# Patient Record
Sex: Female | Born: 1935
Health system: Southern US, Community
[De-identification: ages and names within clinical notes are randomized; demographics above are authoritative.]

## PROBLEM LIST (undated history)

## (undated) DIAGNOSIS — G4733 Obstructive sleep apnea (adult) (pediatric): Secondary | ICD-10-CM

## (undated) DIAGNOSIS — Z8719 Personal history of other diseases of the digestive system: Secondary | ICD-10-CM

## (undated) DIAGNOSIS — K219 Gastro-esophageal reflux disease without esophagitis: Secondary | ICD-10-CM

## (undated) DIAGNOSIS — Z9889 Other specified postprocedural states: Secondary | ICD-10-CM

## (undated) DIAGNOSIS — J869 Pyothorax without fistula: Secondary | ICD-10-CM

## (undated) DIAGNOSIS — I442 Atrioventricular block, complete: Secondary | ICD-10-CM

## (undated) DIAGNOSIS — D649 Anemia, unspecified: Secondary | ICD-10-CM

## (undated) DIAGNOSIS — I4510 Unspecified right bundle-branch block: Secondary | ICD-10-CM

## (undated) DIAGNOSIS — K220 Achalasia of cardia: Secondary | ICD-10-CM

## (undated) DIAGNOSIS — M1990 Unspecified osteoarthritis, unspecified site: Secondary | ICD-10-CM

## (undated) DIAGNOSIS — E042 Nontoxic multinodular goiter: Secondary | ICD-10-CM

## (undated) DIAGNOSIS — I10 Essential (primary) hypertension: Secondary | ICD-10-CM

## (undated) DIAGNOSIS — K4090 Unilateral inguinal hernia, without obstruction or gangrene, not specified as recurrent: Secondary | ICD-10-CM

## (undated) DIAGNOSIS — IMO0002 Reserved for concepts with insufficient information to code with codable children: Secondary | ICD-10-CM

## (undated) DIAGNOSIS — N3642 Intrinsic sphincter deficiency (ISD): Secondary | ICD-10-CM

## (undated) DIAGNOSIS — Z8709 Personal history of other diseases of the respiratory system: Secondary | ICD-10-CM

## (undated) DIAGNOSIS — M353 Polymyalgia rheumatica: Secondary | ICD-10-CM

## (undated) DIAGNOSIS — Z9989 Dependence on other enabling machines and devices: Secondary | ICD-10-CM

## (undated) DIAGNOSIS — N3946 Mixed incontinence: Secondary | ICD-10-CM

## (undated) HISTORY — PX: VIDEO ASSISTED THORACOSCOPY (VATS)/EMPYEMA: SHX6172

## (undated) HISTORY — DX: Polymyalgia rheumatica: M35.3

## (undated) HISTORY — PX: TONSILLECTOMY: SUR1361

## (undated) HISTORY — DX: Unspecified osteoarthritis, unspecified site: M19.90

## (undated) HISTORY — DX: Essential (primary) hypertension: I10

## (undated) HISTORY — DX: Pyothorax without fistula: J86.9

## (undated) HISTORY — DX: Nontoxic multinodular goiter: E04.2

## (undated) HISTORY — PX: CARDIAC PACEMAKER PLACEMENT: SHX583

## (undated) HISTORY — PX: OTHER SURGICAL HISTORY: SHX169

## (undated) HISTORY — DX: Gastro-esophageal reflux disease without esophagitis: K21.9

## (undated) HISTORY — PX: DILATION AND CURETTAGE OF UTERUS: SHX78

---
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
## 1999-07-13 HISTORY — PX: TOTAL HIP ARTHROPLASTY: SHX124

## 2003-07-13 HISTORY — PX: CATARACT EXTRACTION W/ INTRAOCULAR LENS  IMPLANT, BILATERAL: SHX1307

## 2007-12-13 HISTORY — PX: PACEMAKER GENERATOR CHANGE: SHX5998

## 2011-04-01 DIAGNOSIS — M75100 Unspecified rotator cuff tear or rupture of unspecified shoulder, not specified as traumatic: Secondary | ICD-10-CM | POA: Insufficient documentation

## 2011-04-01 DIAGNOSIS — M7550 Bursitis of unspecified shoulder: Secondary | ICD-10-CM | POA: Insufficient documentation

## 2011-05-10 DIAGNOSIS — S63509A Unspecified sprain of unspecified wrist, initial encounter: Secondary | ICD-10-CM | POA: Insufficient documentation

## 2011-07-13 HISTORY — PX: TOE SURGERY: SHX1073

## 2011-07-14 DIAGNOSIS — Z950 Presence of cardiac pacemaker: Secondary | ICD-10-CM | POA: Diagnosis not present

## 2011-07-30 DIAGNOSIS — M353 Polymyalgia rheumatica: Secondary | ICD-10-CM | POA: Diagnosis not present

## 2011-10-20 DIAGNOSIS — D2370 Other benign neoplasm of skin of unspecified lower limb, including hip: Secondary | ICD-10-CM | POA: Diagnosis not present

## 2011-10-20 DIAGNOSIS — M2040 Other hammer toe(s) (acquired), unspecified foot: Secondary | ICD-10-CM | POA: Diagnosis not present

## 2011-10-20 DIAGNOSIS — L600 Ingrowing nail: Secondary | ICD-10-CM | POA: Diagnosis not present

## 2011-10-20 DIAGNOSIS — Z950 Presence of cardiac pacemaker: Secondary | ICD-10-CM | POA: Diagnosis not present

## 2011-10-26 DIAGNOSIS — L820 Inflamed seborrheic keratosis: Secondary | ICD-10-CM | POA: Diagnosis not present

## 2011-10-26 DIAGNOSIS — L219 Seborrheic dermatitis, unspecified: Secondary | ICD-10-CM | POA: Diagnosis not present

## 2011-10-28 DIAGNOSIS — M353 Polymyalgia rheumatica: Secondary | ICD-10-CM | POA: Diagnosis not present

## 2011-10-28 DIAGNOSIS — Z79899 Other long term (current) drug therapy: Secondary | ICD-10-CM | POA: Diagnosis not present

## 2012-01-18 DIAGNOSIS — I4729 Other ventricular tachycardia: Secondary | ICD-10-CM | POA: Insufficient documentation

## 2012-01-18 DIAGNOSIS — I472 Ventricular tachycardia: Secondary | ICD-10-CM | POA: Insufficient documentation

## 2012-01-18 DIAGNOSIS — Z950 Presence of cardiac pacemaker: Secondary | ICD-10-CM | POA: Insufficient documentation

## 2012-01-26 DIAGNOSIS — Z45018 Encounter for adjustment and management of other part of cardiac pacemaker: Secondary | ICD-10-CM | POA: Diagnosis not present

## 2012-01-27 DIAGNOSIS — M899 Disorder of bone, unspecified: Secondary | ICD-10-CM | POA: Diagnosis not present

## 2012-01-27 DIAGNOSIS — K219 Gastro-esophageal reflux disease without esophagitis: Secondary | ICD-10-CM | POA: Diagnosis not present

## 2012-01-27 DIAGNOSIS — IMO0002 Reserved for concepts with insufficient information to code with codable children: Secondary | ICD-10-CM | POA: Diagnosis not present

## 2012-01-27 DIAGNOSIS — M353 Polymyalgia rheumatica: Secondary | ICD-10-CM | POA: Diagnosis not present

## 2012-01-27 DIAGNOSIS — Z7952 Long term (current) use of systemic steroids: Secondary | ICD-10-CM | POA: Insufficient documentation

## 2012-02-02 DIAGNOSIS — Z23 Encounter for immunization: Secondary | ICD-10-CM | POA: Diagnosis not present

## 2012-02-02 DIAGNOSIS — Z029 Encounter for administrative examinations, unspecified: Secondary | ICD-10-CM | POA: Diagnosis not present

## 2012-02-04 DIAGNOSIS — Z111 Encounter for screening for respiratory tuberculosis: Secondary | ICD-10-CM | POA: Diagnosis not present

## 2012-03-28 DIAGNOSIS — K224 Dyskinesia of esophagus: Secondary | ICD-10-CM | POA: Diagnosis not present

## 2012-04-17 DIAGNOSIS — L988 Other specified disorders of the skin and subcutaneous tissue: Secondary | ICD-10-CM | POA: Diagnosis not present

## 2012-04-17 DIAGNOSIS — M2040 Other hammer toe(s) (acquired), unspecified foot: Secondary | ICD-10-CM | POA: Insufficient documentation

## 2012-04-17 DIAGNOSIS — M79609 Pain in unspecified limb: Secondary | ICD-10-CM | POA: Diagnosis not present

## 2012-04-17 DIAGNOSIS — L989 Disorder of the skin and subcutaneous tissue, unspecified: Secondary | ICD-10-CM | POA: Insufficient documentation

## 2012-04-17 DIAGNOSIS — M79676 Pain in unspecified toe(s): Secondary | ICD-10-CM | POA: Insufficient documentation

## 2012-04-17 DIAGNOSIS — Z8679 Personal history of other diseases of the circulatory system: Secondary | ICD-10-CM | POA: Diagnosis not present

## 2012-04-21 DIAGNOSIS — M2040 Other hammer toe(s) (acquired), unspecified foot: Secondary | ICD-10-CM | POA: Diagnosis not present

## 2012-04-27 DIAGNOSIS — M353 Polymyalgia rheumatica: Secondary | ICD-10-CM | POA: Diagnosis not present

## 2012-04-27 DIAGNOSIS — IMO0002 Reserved for concepts with insufficient information to code with codable children: Secondary | ICD-10-CM | POA: Diagnosis not present

## 2012-04-28 DIAGNOSIS — Z9889 Other specified postprocedural states: Secondary | ICD-10-CM | POA: Insufficient documentation

## 2012-05-03 DIAGNOSIS — Z950 Presence of cardiac pacemaker: Secondary | ICD-10-CM | POA: Diagnosis not present

## 2012-06-19 ENCOUNTER — Ambulatory Visit (INDEPENDENT_AMBULATORY_CARE_PROVIDER_SITE_OTHER): Payer: Medicare Other | Admitting: Family Medicine

## 2012-06-19 ENCOUNTER — Emergency Department (HOSPITAL_COMMUNITY): Payer: Medicare Other

## 2012-06-19 ENCOUNTER — Encounter (HOSPITAL_COMMUNITY): Payer: Self-pay | Admitting: *Deleted

## 2012-06-19 ENCOUNTER — Inpatient Hospital Stay (HOSPITAL_COMMUNITY)
Admission: EM | Admit: 2012-06-19 | Discharge: 2012-06-28 | DRG: 163 | Disposition: A | Discharge status: Home Health | Payer: Medicare Other | Attending: Internal Medicine | Admitting: Internal Medicine

## 2012-06-19 ENCOUNTER — Ambulatory Visit: Payer: Medicare Other

## 2012-06-19 VITALS — BP 132/76 | HR 101 | Temp 99.3°F | Resp 18 | Ht 61.25 in | Wt 167.0 lb

## 2012-06-19 DIAGNOSIS — R0902 Hypoxemia: Secondary | ICD-10-CM

## 2012-06-19 DIAGNOSIS — M129 Arthropathy, unspecified: Secondary | ICD-10-CM | POA: Diagnosis present

## 2012-06-19 DIAGNOSIS — D649 Anemia, unspecified: Secondary | ICD-10-CM | POA: Diagnosis not present

## 2012-06-19 DIAGNOSIS — J95821 Acute postprocedural respiratory failure: Secondary | ICD-10-CM | POA: Diagnosis not present

## 2012-06-19 DIAGNOSIS — R079 Chest pain, unspecified: Secondary | ICD-10-CM

## 2012-06-19 DIAGNOSIS — R0789 Other chest pain: Secondary | ICD-10-CM | POA: Diagnosis not present

## 2012-06-19 DIAGNOSIS — E876 Hypokalemia: Secondary | ICD-10-CM | POA: Diagnosis not present

## 2012-06-19 DIAGNOSIS — R05 Cough: Secondary | ICD-10-CM

## 2012-06-19 DIAGNOSIS — K224 Dyskinesia of esophagus: Secondary | ICD-10-CM | POA: Diagnosis present

## 2012-06-19 DIAGNOSIS — J690 Pneumonitis due to inhalation of food and vomit: Secondary | ICD-10-CM | POA: Diagnosis not present

## 2012-06-19 DIAGNOSIS — K220 Achalasia of cardia: Secondary | ICD-10-CM | POA: Diagnosis present

## 2012-06-19 DIAGNOSIS — R509 Fever, unspecified: Secondary | ICD-10-CM

## 2012-06-19 DIAGNOSIS — D72829 Elevated white blood cell count, unspecified: Secondary | ICD-10-CM | POA: Diagnosis present

## 2012-06-19 DIAGNOSIS — B9689 Other specified bacterial agents as the cause of diseases classified elsewhere: Secondary | ICD-10-CM | POA: Diagnosis not present

## 2012-06-19 DIAGNOSIS — R059 Cough, unspecified: Secondary | ICD-10-CM

## 2012-06-19 DIAGNOSIS — K219 Gastro-esophageal reflux disease without esophagitis: Secondary | ICD-10-CM | POA: Diagnosis present

## 2012-06-19 DIAGNOSIS — Z4682 Encounter for fitting and adjustment of non-vascular catheter: Secondary | ICD-10-CM | POA: Diagnosis not present

## 2012-06-19 DIAGNOSIS — J869 Pyothorax without fistula: Secondary | ICD-10-CM | POA: Diagnosis not present

## 2012-06-19 DIAGNOSIS — W449XXA Unspecified foreign body entering into or through a natural orifice, initial encounter: Secondary | ICD-10-CM | POA: Diagnosis present

## 2012-06-19 DIAGNOSIS — G4733 Obstructive sleep apnea (adult) (pediatric): Secondary | ICD-10-CM | POA: Diagnosis present

## 2012-06-19 DIAGNOSIS — E042 Nontoxic multinodular goiter: Secondary | ICD-10-CM | POA: Diagnosis present

## 2012-06-19 DIAGNOSIS — IMO0002 Reserved for concepts with insufficient information to code with codable children: Secondary | ICD-10-CM

## 2012-06-19 DIAGNOSIS — R1319 Other dysphagia: Secondary | ICD-10-CM

## 2012-06-19 DIAGNOSIS — R1310 Dysphagia, unspecified: Secondary | ICD-10-CM

## 2012-06-19 DIAGNOSIS — M353 Polymyalgia rheumatica: Secondary | ICD-10-CM | POA: Diagnosis not present

## 2012-06-19 DIAGNOSIS — T17908A Unspecified foreign body in respiratory tract, part unspecified causing other injury, initial encounter: Secondary | ICD-10-CM | POA: Diagnosis not present

## 2012-06-19 DIAGNOSIS — Z950 Presence of cardiac pacemaker: Secondary | ICD-10-CM | POA: Diagnosis not present

## 2012-06-19 DIAGNOSIS — Z79899 Other long term (current) drug therapy: Secondary | ICD-10-CM | POA: Diagnosis not present

## 2012-06-19 DIAGNOSIS — J9859 Other diseases of mediastinum, not elsewhere classified: Secondary | ICD-10-CM | POA: Insufficient documentation

## 2012-06-19 DIAGNOSIS — R222 Localized swelling, mass and lump, trunk: Secondary | ICD-10-CM | POA: Diagnosis not present

## 2012-06-19 DIAGNOSIS — J90 Pleural effusion, not elsewhere classified: Secondary | ICD-10-CM | POA: Diagnosis not present

## 2012-06-19 DIAGNOSIS — R0602 Shortness of breath: Secondary | ICD-10-CM | POA: Diagnosis not present

## 2012-06-19 DIAGNOSIS — J984 Other disorders of lung: Secondary | ICD-10-CM | POA: Diagnosis not present

## 2012-06-19 DIAGNOSIS — R091 Pleurisy: Secondary | ICD-10-CM | POA: Diagnosis not present

## 2012-06-19 LAB — COMPREHENSIVE METABOLIC PANEL
ALT: 24 U/L (ref 0–35)
AST: 21 U/L (ref 0–37)
Albumin: 3.1 g/dL — ABNORMAL LOW (ref 3.5–5.2)
Alkaline Phosphatase: 122 U/L — ABNORMAL HIGH (ref 39–117)
Chloride: 99 mEq/L (ref 96–112)
Creatinine, Ser: 0.61 mg/dL (ref 0.50–1.10)
Potassium: 3.6 mEq/L (ref 3.5–5.1)
Sodium: 138 mEq/L (ref 135–145)
Total Bilirubin: 0.4 mg/dL (ref 0.3–1.2)

## 2012-06-19 LAB — URINALYSIS, ROUTINE W REFLEX MICROSCOPIC
Glucose, UA: NEGATIVE mg/dL
Hgb urine dipstick: NEGATIVE
Leukocytes, UA: NEGATIVE
pH: 6 (ref 5.0–8.0)

## 2012-06-19 LAB — POCT CBC
Granulocyte percent: 86.9 %G — AB (ref 37–80)
MCV: 101.1 fL — AB (ref 80–97)
MID (cbc): 0.4 (ref 0–0.9)
POC Granulocyte: 11.2 — AB (ref 2–6.9)
POC LYMPH PERCENT: 9.8 %L — AB (ref 10–50)
POC MID %: 3.3 %M (ref 0–12)
Platelet Count, POC: 191 10*3/uL (ref 142–424)
RDW, POC: 15.3 %

## 2012-06-19 LAB — CBC WITH DIFFERENTIAL/PLATELET
Basophils Absolute: 0 10*3/uL (ref 0.0–0.1)
Basophils Relative: 0 % (ref 0–1)
Lymphocytes Relative: 5 % — ABNORMAL LOW (ref 12–46)
MCHC: 33.6 g/dL (ref 30.0–36.0)
Neutro Abs: 12.1 10*3/uL — ABNORMAL HIGH (ref 1.7–7.7)
Neutrophils Relative %: 91 % — ABNORMAL HIGH (ref 43–77)
Platelets: 178 10*3/uL (ref 150–400)
RDW: 14.1 % (ref 11.5–15.5)
WBC: 13.3 10*3/uL — ABNORMAL HIGH (ref 4.0–10.5)

## 2012-06-19 MED ORDER — ACETAMINOPHEN 650 MG RE SUPP: 650.0000 mg | Freq: Four times a day (QID) | RECTAL | Status: DC | PRN

## 2012-06-19 MED ORDER — OXYCODONE HCL 5 MG PO TABS
5.0000 mg | ORAL_TABLET | ORAL | Status: DC | PRN
  Administered 2012-06-20 – 2012-06-21 (×3): 5 mg via ORAL
  Filled 2012-06-19: qty 4
  Filled 2012-06-19 (×3): qty 1

## 2012-06-19 MED ORDER — ACETAMINOPHEN 325 MG PO TABS: 650.0000 mg | ORAL_TABLET | Freq: Four times a day (QID) | ORAL | Status: DC | PRN

## 2012-06-19 MED ORDER — SODIUM CHLORIDE 0.9 % IV SOLN: 250.0000 mL | INTRAVENOUS | Status: DC | PRN

## 2012-06-19 MED ORDER — IOHEXOL 300 MG/ML  SOLN: 80.0000 mL | Freq: Once | INTRAMUSCULAR | Status: AC | PRN

## 2012-06-19 MED ORDER — MORPHINE SULFATE 2 MG/ML IJ SOLN
1.0000 mg | INTRAMUSCULAR | Status: DC | PRN
  Administered 2012-06-20 – 2012-06-23 (×2): 1 mg via INTRAVENOUS
  Filled 2012-06-19 (×2): qty 1

## 2012-06-19 MED ORDER — ONDANSETRON HCL 4 MG/2ML IJ SOLN
4.0000 mg | Freq: Once | INTRAMUSCULAR | Status: AC
  Administered 2012-06-19: 4 mg via INTRAVENOUS
  Filled 2012-06-19: qty 2

## 2012-06-19 MED ORDER — SODIUM CHLORIDE 0.9 % IJ SOLN: 3.0000 mL | INTRAMUSCULAR | Status: DC | PRN

## 2012-06-19 MED ORDER — ONDANSETRON HCL 4 MG/2ML IJ SOLN: 4.0000 mg | Freq: Three times a day (TID) | INTRAMUSCULAR | Status: AC | PRN

## 2012-06-19 MED ORDER — LEVOFLOXACIN IN D5W 500 MG/100ML IV SOLN
500.0000 mg | INTRAVENOUS | Status: DC
  Administered 2012-06-19 – 2012-06-21 (×3): 500 mg via INTRAVENOUS
  Filled 2012-06-19 (×4): qty 100

## 2012-06-19 MED ORDER — HYDROCHLOROTHIAZIDE 25 MG PO TABS
25.0000 mg | ORAL_TABLET | Freq: Every day | ORAL | Status: DC
  Administered 2012-06-20 – 2012-06-22 (×3): 25 mg via ORAL
  Filled 2012-06-19 (×5): qty 1

## 2012-06-19 MED ORDER — SODIUM CHLORIDE 0.9 % IJ SOLN
3.0000 mL | Freq: Two times a day (BID) | INTRAMUSCULAR | Status: DC
  Administered 2012-06-19 – 2012-06-22 (×6): 3 mL via INTRAVENOUS

## 2012-06-19 MED ORDER — PANTOPRAZOLE SODIUM 40 MG PO TBEC
40.0000 mg | DELAYED_RELEASE_TABLET | Freq: Every day | ORAL | Status: DC
  Administered 2012-06-19: 40 mg via ORAL
  Filled 2012-06-19 (×2): qty 1

## 2012-06-19 MED ORDER — METOPROLOL TARTRATE 25 MG PO TABS
25.0000 mg | ORAL_TABLET | Freq: Every day | ORAL | Status: DC
  Administered 2012-06-20 – 2012-06-26 (×5): 25 mg via ORAL
  Filled 2012-06-19 (×7): qty 1

## 2012-06-19 MED ORDER — HYDROMORPHONE HCL PF 1 MG/ML IJ SOLN
1.0000 mg | Freq: Once | INTRAMUSCULAR | Status: AC
  Administered 2012-06-19: 1 mg via INTRAVENOUS
  Filled 2012-06-19: qty 1

## 2012-06-19 MED ORDER — PREDNISONE 5 MG PO TABS
5.0000 mg | ORAL_TABLET | Freq: Every day | ORAL | Status: DC
  Administered 2012-06-20 – 2012-06-22 (×3): 5 mg via ORAL
  Filled 2012-06-19 (×4): qty 1

## 2012-06-19 MED ORDER — HYDROMORPHONE HCL PF 1 MG/ML IJ SOLN: 1.0000 mg | INTRAMUSCULAR | Status: DC | PRN

## 2012-06-19 NOTE — ED Provider Notes (Signed)
History     CSN: [id number]  Arrival date & time 06/19/12  1338   First MD Initiated Contact with Patient 06/19/12 1358      Chief Complaint  Patient presents with  . Shortness of Breath  . Chest Pain    (Consider location/radiation/quality/duration/timing/severity/associated sxs/prior treatment) HPI  The patient presents with concerns of cough, generalized feeling of unwell.  Symptoms began approximately one week ago, supple.  Since onset she has developed generalized sense of discomfort, intermittent dyspnea, and new cough.  She denies new fevers, chills, nausea, vomiting, diarrhea, confusion, disorientation. With her symptoms are worsening, she presented to an urgent care center.  X-ray there was concerning, and she is referred here for evaluation.   I discussed the patient's case from the urgent Center, prior to the patient's arrival here.  Past Medical History  Diagnosis Date  . Arthritis   . GERD (gastroesophageal reflux disease)   . Cataract   . Blood transfusion without reported diagnosis     Past Surgical History  Procedure Date  . Appendectomy   . Joint replacement     Family History  Problem Relation Age of Onset  . Arthritis Father   . Heart disease Father   . Cancer Brother   . Heart disease Brother   . Diabetes Son     History  Substance Use Topics  . Smoking status: Never Smoker   . Smokeless tobacco: Never Used  . Alcohol Use: Yes    OB History    Grav Para Term Preterm Abortions TAB SAB Ect Mult Living                  Review of Systems  Constitutional:       Per HPI, otherwise negative  HENT:       Per HPI, otherwise negative  Eyes: Negative.   Respiratory:       Per HPI, otherwise negative  Cardiovascular:       Per HPI, otherwise negative  Gastrointestinal: Negative for vomiting.  Genitourinary: Negative.   Musculoskeletal:       Per HPI, otherwise negative  Skin: Negative.   Neurological: Negative for syncope.     Allergies  Actonel and Ivp dye  Home Medications   Current Outpatient Rx  Name  Route  Sig  Dispense  Refill  . HYDROCHLOROTHIAZIDE 25 MG PO TABS   Oral   Take 25 mg by mouth daily.         Marland Kitchen METOPROLOL TARTRATE 25 MG PO TABS   Oral   Take 25 mg by mouth 2 (two) times daily.         Marland Kitchen PREDNISONE 5 MG PO TABS   Oral   Take 5 mg by mouth daily.           BP 137/66  Pulse 113  Temp 98.3 F (36.8 C) (Oral)  Resp 22  SpO2 92%  Physical Exam  Nursing note and vitals reviewed. Constitutional: She is oriented to person, place, and time. She appears well-developed and well-nourished. No distress.  HENT:  Head: Normocephalic and atraumatic.  Eyes: Conjunctivae normal and EOM are normal.  Cardiovascular: Regular rhythm.  Tachycardia present.   Pulmonary/Chest: No stridor. Tachypnea noted. No respiratory distress. She has decreased breath sounds in the right upper field, the right middle field and the right lower field.  Abdominal: She exhibits no distension.  Musculoskeletal: She exhibits no edema.  Neurological: She is alert and oriented to person, place, and time.  No cranial nerve deficit.  Skin: Skin is warm and dry.  Psychiatric: She has a normal mood and affect.    ED Course  Procedures (including critical care time)   Labs Reviewed  CBC WITH DIFFERENTIAL  COMPREHENSIVE METABOLIC PANEL  TROPONIN I  URINALYSIS, ROUTINE W REFLEX MICROSCOPIC   Dg Chest 2 View  06/19/2012  *RADIOLOGY REPORT*  Clinical Data: Fever, right-sided chest pain  CHEST - 2 VIEW  Comparison: None.  Findings: Normal cardiac silhouette.  There is a large right effusion at the right lung base.  There is a large smoothly marginated density along the right mediastinum superior to the hilum.  This could represent the esophagus or loculated fluid. Cannot exclude a paramediastinal mass.  There is a loculated fluid collection along the right horizontal fissure.  No pneumothorax. Left pacemaker in  place.  IMPRESSION:  1.  Large right effusion. 2.  Paramediastinal mass superior to the right hilum.  Differential includes loculated fluid,  postsurgical esophagus,  or paramediastinal mass.  Recommend CT thorax with contrast for further evaluation. 3. Loculated fluid along the right horizontal fissure.   Original Report Authenticated By: Genevive Bi, M.D.      No diagnosis found.  I interpreted the x-ray from her prior to this arrival.  X-ray concerning for effusion versus opacification consistent with mass or infection   O2- 89%ra, corrects to 97% w [place] 3L, abnormal  Cardiac: 101st, abnormal   Date: 06/19/2012  Rate: 112  Rhythm: sinus tachycardia  QRS Axis: right  Intervals: normal  ST/T Wave abnormalities: nonspecific T wave changes  Conduction Disutrbances:right bundle branch block  Narrative Interpretation:   Old EKG Reviewed: none available ABNORMAL  MDM  This patient presents with new hypoxia, dyspnea, chest pain.  The patient's initial evaluation is notable for a right-sided pleural effusion, opacifications concerning for mass.  With supplemental oxygen the patient's oxygen levels are appropriate.  She is in no distress, though she is tachycardic, hypoxic.  With concerns for malignancy, and/or new pleural effusion, the patient we admitted for further evaluation and management.  CT scan was ordered, pending on admission.        Gerhard Munch, MD 06/19/12 [phone]

## 2012-06-19 NOTE — H&P (Signed)
[facility] History and Physical  [name] ZOX:[id number] DOB: April 27, 1929 DOA: 06/19/2012  Referring physician:  Gerhard Munch, ER physician PCP: [name], MD  Specialists: Cyril Mourning, pulmonary medicine  Chief Complaint: Shortness of breath  HPI: [name] is a 78 y.o. female  With past medical history polymyalgia rheumatica as well as pacemaker who for the last 2 days of progressively worsening shortness of breath. Patient did not really have been associated wheezing or cough but it got to the bone she felt she could barely catch her breath and came into the emergency room today for further evaluation. In the emergency room, she was noted to have a mild leukocytosis of 12-13 and a chest x-ray noted a large right pleural effusion with some loculated fluid along the right horizontal fissure in the paramediastinal mass superior to the right hilum. This is concerning for the possibility of malignancy versus infectious process. Hospitals were called for further evaluation and treatment. Patient was given medication for pain as well as oxygen which improved her symptoms.  Review of Systems: When I saw the patient emergency room, she was feeling a little bit better. She was tired. She stated her breathing was easier now that she had oxygen. She denies any headaches, vision changes, dysphasia, chest pain, palpitations, wheezing, abdominal pain, hematuria, dysuria, constipation, diarrhea, focal extremity numbness or weakness or pain. Her only other complaint was of some acid reflux. Review systems otherwise negative.  Past Medical History  Diagnosis Date  . Arthritis   . GERD (gastroesophageal reflux disease)   . Cataract   . Blood transfusion without reported diagnosis    polymyalgia rheumatica Status post pacemaker  Past Surgical History  Procedure Date  . Appendectomy   . Joint replacement    Social History:  reports that she has never smoked. She has never  used smokeless tobacco. She reports that she drinks alcohol. She reports that she does not use illicit drugs. Patient recently moved into a independent living facility. She is normally able to put her in normal stockings daily living without assistance.  Allergies  Allergen Reactions  . Actonel (Risedronate Sodium)   . Ivp Dye (Iodinated Diagnostic Agents) Nausea And Vomiting    Family History  Problem Relation Age of Onset  . Arthritis Father   . Heart disease Father   . Cancer Brother   . Heart disease Brother   . Diabetes Son     Prior to Admission medications   Medication Sig Start Date End Date Taking? Authorizing Provider  b complex vitamins tablet Take 1 tablet by mouth daily.   Yes Historical Provider, MD  hydrochlorothiazide (HYDRODIURIL) 25 MG tablet Take 25 mg by mouth daily.   Yes Historical Provider, MD  metoprolol tartrate (LOPRESSOR) 25 MG tablet Take 25 mg by mouth daily.    Yes Historical Provider, MD  predniSONE (DELTASONE) 5 MG tablet Take 5 mg by mouth daily.   Yes Historical Provider, MD  vitamin C (ASCORBIC ACID) 500 MG tablet Take 500 mg by mouth daily.   Yes Historical Provider, MD  Vitamins A & D (VITAMIN A & D PO) Take 1 capsule by mouth daily.   Yes Historical Provider, MD   Physical Exam: Filed Vitals:   06/19/12 1347  BP: 137/66  Pulse: 113  Temp: 98.3 F (36.8 C)  TempSrc: Oral  Resp: 22  SpO2: 92%     General:  Alert and oriented x3, no acute distress, fatigued, looks younger than stated age  Eyes: Sclera nonicteric, extraocular movements are intact  ENT: Normocephalic, atraumatic, mucous membranes are slightly dry  Neck: Supple no evidence of JVD  Cardiovascular: Regular rate and rhythm, S1-S2  Respiratory: Decreased breath sounds right side halfway down  Abdomen: Soft, nontender, obese, positive bowel sounds  Skin: No skin breaks, tears or lesions  Musculoskeletal: No clubbing or cyanosis, trace pitting edema  Psychiatric:  Patient appropriate evidence of psychoses  Neurologic: No overt deficits  Labs on Admission:  Basic Metabolic Panel:  Lab 06/19/12 1610  NA 138  K 3.6  CL 99  CO2 31  GLUCOSE 155*  BUN 18  CREATININE 0.61  CALCIUM 9.8  MG --  PHOS --   Liver Function Tests:  Lab 06/19/12 1437  AST 21  ALT 24  ALKPHOS 122*  BILITOT 0.4  PROT 6.9  ALBUMIN 3.1*   CBC:  Lab 06/19/12 1437 06/19/12 1231  WBC 13.3* 12.9*  NEUTROABS 12.1* --  HGB 13.1 12.5  HCT 39.0 41.8  MCV 95.1 101.1*  PLT 178 --   Cardiac Enzymes:  Lab 06/19/12 1437  CKTOTAL --  CKMB --  CKMBINDEX --  TROPONINI <0.30    Radiological Exams on Admission: Dg Chest 2 View  06/19/2012 IMPRESSION:  1.  Large right effusion. 2.  Paramediastinal mass superior to the right hilum.  Differential includes loculated fluid,  postsurgical esophagus,  or paramediastinal mass.  Recommend CT thorax with contrast for further evaluation. 3. Loculated fluid along the right horizontal fissure.   Original Report Authenticated By: Genevive Bi, M.D.     EKG: Independently reviewed. Sinus tachycardia with right bundle branch block  Assessment/Plan Principal Problem:  *Pleural effusion: Suspicious for malignancy although infectious process cannot be ruled out. Awaiting CT scan results. Have spoken with pulmonary who will see in the morning for thoracentesis-diagnostic/therapeutic Active Problems:  Polymyalgia rheumatica: Continue by mouth steroids are stable  Pacemaker: Noted. We'll not be able to get MRI if needed  Mediastinal mass: May be solid tumor versus related to loculated effusion GERD: Continue PPI    Code Status: Full code  Family Communication: Spoke with husband by phone  Disposition Plan: *likely in the hospital for a few days  Time spent: 30 min  Hollice Espy [facility] Pager [phone]  If 7PM-7AM, please contact night-coverage [email or web address] Password [email or web address] 06/19/2012, 5:59 PM

## 2012-06-19 NOTE — Patient Instructions (Addendum)
Please proceed directly to [facility]. They will see you and further evaluate your lungs.  Take care!

## 2012-06-19 NOTE — ED Notes (Signed)
Report to [facility]. Pt awaiting transport

## 2012-06-19 NOTE — ED Notes (Signed)
Pt returned from ct scan admitting nurse is at bedside

## 2012-06-19 NOTE — ED Notes (Signed)
Attempted to call report to floor [name] is not available at this time

## 2012-06-19 NOTE — Progress Notes (Signed)
[facility] [address] [place] [address] [phone]  Date:  06/19/2012   Name:  [name]   DOB:  1928-10-04   MRN:  [id number]  PCP:  No primary provider on file.    Chief Complaint: Rash   History of Present Illness:  [name] is a 78 y.o. very pleasant female patient who presents with the following:  She notes some pain in the right side of her trunk- she has noted this for a few days. She has also had a cough for a few days.  She thought the cough was due to GERD.   She had noted a subjective fever at home.  [name] has also noted that she felt slightly SOB over the last 2 days.   She also feels achy and tired.   She has had shingles before and thought this might be shingles again- however as of yet there is no rash or lesion.   [name] recently moved to [place] from [place] and is living at [facility].  She is here with her husband  There is no problem list on file for this patient.   Past Medical History  Diagnosis Date  . Arthritis   . GERD (gastroesophageal reflux disease)   . Cataract   . Blood transfusion without reported diagnosis     Past Surgical History  Procedure Date  . Appendectomy   . Joint replacement     History  Substance Use Topics  . Smoking status: Never Smoker   . Smokeless tobacco: Not on file  . Alcohol Use: Yes    Family History  Problem Relation Age of Onset  . Arthritis Father   . Heart disease Father   . Cancer Brother   . Heart disease Brother   . Diabetes Son     Allergies  Allergen Reactions  . Actonel (Risedronate Sodium)   . Ivp Dye (Iodinated Diagnostic Agents) Nausea And Vomiting    Medication list has been reviewed and updated.  Current Outpatient Prescriptions on File Prior to Visit  Medication Sig Dispense Refill  . hydrochlorothiazide (HYDRODIURIL) 25 MG tablet Take 25 mg by mouth daily.      . metoprolol tartrate (LOPRESSOR) 25 MG tablet Take 25  mg by mouth 2 (two) times daily.        Review of Systems:  As per HPI- otherwise negative.   Physical Examination: Filed Vitals:   06/19/12 1131  BP: 132/76  Pulse: 101  Temp: 99.3 F (37.4 C)  Resp: 18   Filed Vitals:   06/19/12 1131  Height: 5' 1.25" (1.556 m)  Weight: 167 lb (75.751 kg)   Body mass index is 31.30 kg/(m^2). Ideal Body Weight: Weight in (lb) to have BMI = 25: 133.1   GEN: WDWN, NAD, Non-toxic, A & O x 3, overweight HEENT: Atraumatic, Normocephalic. Neck supple. No masses, No LAD. Bilateral TM wnl, oropharynx normal.  PEERL,EOMI.   Ears and Nose: No external deformity. CV: RRR, No M/G/R. No JVD. No thrill. No extra heart sounds. PULM:  No retractions. No resp. distress. No accessory muscle use. Decreased air movement in the right lung.  No crackles or wheezes auscultated. Chest wall- no rash or sign of shingles. She does have some tenderness to pressing on the right ribs under the right breast.   ABD: S, NT, ND, +BS. No rebound. No HSM. EXTR: No c/c/e NEURO Normal gait.  PSYCH: Normally interactive. Conversant. Not depressed or anxious  appearing.  Calm demeanor.   [facility] reading (PRIMARY) by  Dr. Patsy Lager. CXR:  Severe RLL infiltrate vs mass, pacemaker in place.  CHEST - 2 VIEW  Comparison: None.  Findings: Normal cardiac silhouette. There is a large right effusion at the right lung base. There is a large smoothly marginated density along the right mediastinum superior to the hilum. This could represent the esophagus or loculated fluid. Cannot exclude a paramediastinal mass. There is a loculated fluid collection along the right horizontal fissure. No pneumothorax. Left pacemaker in place.  IMPRESSION:  1. Large right effusion. 2. Paramediastinal mass superior to the right hilum. Differential includes loculated fluid, postsurgical esophagus, or paramediastinal mass. Recommend CT thorax with contrast for further evaluation. 3. Loculated fluid  along the right horizontal fissure.   Results for orders placed in visit on 06/19/12  POCT CBC      Component Value Range   WBC 12.9 (*) 4.6 - 10.2 K/uL   Lymph, poc 1.3  0.6 - 3.4   POC LYMPH PERCENT 9.8 (*) 10 - 50 %L   MID (cbc) 0.4  0 - 0.9   POC MID % 3.3  0 - 12 %M   POC Granulocyte 11.2 (*) 2 - 6.9   Granulocyte percent 86.9 (*) 37 - 80 %G   RBC 4.13  4.04 - 5.48 M/uL   Hemoglobin 12.5  12.2 - 16.2 g/dL   HCT, POC 16.1  09.6 - 47.9 %   MCV 101.1 (*) 80 - 97 fL   MCH, POC 30.3  27 - 31.2 pg   MCHC 29.9 (*) 31.8 - 35.4 g/dL   RDW, POC 04.5     Platelet Count, POC 191  142 - 424 K/uL   MPV 8.2  0 - 99.8 fL    Assessment and Plan: 1. Right-sided chest wall pain  DG Chest 2 View  2. Cough  DG Chest 2 View, CT Chest W Contrast  3. Fever  POCT CBC, DG Chest 2 View, CT Chest W Contrast  4. Polymyalgia rheumatica    5. Pacemaker     Very concerning CXR findings as above.  She needs a CT and probably hospital admission.  Will proceed to [facility] with her husband. Declined ambulance transfer and is stable for private car transfer.     Abbe Amsterdam, MD

## 2012-06-19 NOTE — Progress Notes (Signed)
Pt confirms [name] will be her new pcp at facility [facility] updated Pt states she had her prednisone and Ibuprofen at the facility but is needing more Ibuprofen CM updated ED RN

## 2012-06-19 NOTE — ED Notes (Signed)
Pt states past couple days "hasn't felt good", yesterday laid on the couch, then today went to the urgent care d/t shortness of breath, chest pain/R under breast radiating around side to back pain, pt states she thought she had shingles but no sores present, but the doctor told her she might have pna.

## 2012-06-20 DIAGNOSIS — J90 Pleural effusion, not elsewhere classified: Secondary | ICD-10-CM

## 2012-06-20 DIAGNOSIS — T17908A Unspecified foreign body in respiratory tract, part unspecified causing other injury, initial encounter: Secondary | ICD-10-CM | POA: Diagnosis present

## 2012-06-20 DIAGNOSIS — K220 Achalasia of cardia: Secondary | ICD-10-CM | POA: Diagnosis present

## 2012-06-20 DIAGNOSIS — R0902 Hypoxemia: Secondary | ICD-10-CM

## 2012-06-20 DIAGNOSIS — R222 Localized swelling, mass and lump, trunk: Secondary | ICD-10-CM

## 2012-06-20 LAB — BASIC METABOLIC PANEL
CO2: 30 mEq/L (ref 19–32)
Calcium: 9.4 mg/dL (ref 8.4–10.5)
Chloride: 97 mEq/L (ref 96–112)
Glucose, Bld: 123 mg/dL — ABNORMAL HIGH (ref 70–99)
Potassium: 3.6 mEq/L (ref 3.5–5.1)
Sodium: 135 mEq/L (ref 135–145)

## 2012-06-20 LAB — CBC
Hemoglobin: 12.4 g/dL (ref 12.0–15.0)
MCH: 31.9 pg (ref 26.0–34.0)
Platelets: 158 10*3/uL (ref 150–400)
RBC: 3.89 MIL/uL (ref 3.87–5.11)
WBC: 13.7 10*3/uL — ABNORMAL HIGH (ref 4.0–10.5)

## 2012-06-20 MED ORDER — PANTOPRAZOLE SODIUM 40 MG IV SOLR
40.0000 mg | Freq: Two times a day (BID) | INTRAVENOUS | Status: DC
  Administered 2012-06-20 – 2012-06-26 (×13): 40 mg via INTRAVENOUS
  Filled 2012-06-20 (×15): qty 40

## 2012-06-20 NOTE — Progress Notes (Signed)
CSW met with patient. Patient is alert and oriented X3. Patient states that [facility] from [facility] and will return to her apartment. CSW has no access to [place] at this time. Psychosocial completed and placed on shadow chart.  [name] MSW, LCSW [phone]

## 2012-06-20 NOTE — Progress Notes (Signed)
Spoke with the pt and her husband about what the critical care team Steve,NP and Dr.Yacoub had come by and discussed with the pt abut her POC. Pt does not remember them coming by only remembers Dr. Arlyce Dice but was still unsure of what any of the doctors have told her up to this point. Tried to read over the notes and re explain all of what I could for her and her husband. Will continue to monitor.

## 2012-06-20 NOTE — Progress Notes (Signed)
Pt called and spoke with her husband who is suppose to bring her home med's for verfication later today.

## 2012-06-20 NOTE — Consult Note (Signed)
PULMONARY  / CRITICAL CARE MEDICINE  Name: [name] MRN: [id number] DOB: October 15, 1928    LOS: 1  REFERRING PROVIDER:  [facility]  CHIEF COMPLAINT:  Dyspnea   BRIEF PATIENT DESCRIPTION:  78 yo WF with SOB with activity.  LINES / TUBES:   CULTURES:   ANTIBIOTICS: 12-9 Levaquin>>  SIGNIFICANT EVENTS:  12-9 ct with rt pleural effusion  HISTORY OF PRESENT ILLNESS:   78 yo with increased sob over several weeks and was transported to [facility] and found to have moderate rt effusion.PCCM asked to evaluate for thoracentesis.    PAST MEDICAL HISTORY :  Past Medical History  Diagnosis Date  . Arthritis   . GERD (gastroesophageal reflux disease)   . Cataract   . Blood transfusion without reported diagnosis    Past Surgical History  Procedure Date  . Appendectomy   . Joint replacement    Prior to Admission medications   Medication Sig Start Date End Date Taking? Authorizing Provider  b complex vitamins tablet Take 1 tablet by mouth daily.   Yes Historical Provider, MD  hydrochlorothiazide (HYDRODIURIL) 25 MG tablet Take 25 mg by mouth daily.   Yes Historical Provider, MD  metoprolol succinate (TOPROL-XL) 25 MG 24 hr tablet Take 25 mg by mouth daily. Verified that patient takes XL formulation by medication bottle.   Yes Historical Provider, MD  predniSONE (DELTASONE) 5 MG tablet Take 5 mg by mouth daily.   Yes Historical Provider, MD  vitamin C (ASCORBIC ACID) 500 MG tablet Take 500 mg by mouth daily.   Yes Historical Provider, MD  Vitamins A & D (VITAMIN A & D PO) Take 1 capsule by mouth daily.   Yes Historical Provider, MD   Allergies  Allergen Reactions  . Actonel (Risedronate Sodium)   . Ivp Dye (Iodinated Diagnostic Agents) Nausea And Vomiting    FAMILY HISTORY:  Family History  Problem Relation Age of Onset  . Arthritis Father   . Heart disease Father   . Cancer Brother   . Heart disease Brother   . Diabetes Son    SOCIAL HISTORY:  reports that she has never  smoked. She has never used smokeless tobacco. She reports that she drinks alcohol. She reports that she does not use illicit drugs.  REVIEW OF SYSTEMS:   See HPI  INTERVAL HISTORY:   VITAL SIGNS: Temp:  [98.3 F (36.8 C)-100.7 F (38.2 C)] 98.6 F (37 C) (12/10 0645) Pulse Rate:  [92-113] 95  (12/10 0645) Resp:  [18-22] 20  (12/10 0645) BP: (105-137)/(50-66) 119/50 mmHg (12/10 0645) SpO2:  [92 %-97 %] 95 % (12/10 0645) Weight:  [76.8 kg (169 lb 5 oz)] 76.8 kg (169 lb 5 oz) (12/09 1845)  PHYSICAL EXAMINATION: General:  EWW NAD @ rest Neuro:  Intact HEENT:  No lan Neck:  No jvd Cardiovascular:  hsd Lungs:  Decreased rt 1/4 down Abdomen: +bs Musculoskeletal:  intact Skin:  warm   Lab 06/20/12 0504 06/19/12 1437  NA 135 138  K 3.6 3.6  CL 97 99  CO2 30 31  BUN 18 18  CREATININE 0.67 0.61  GLUCOSE 123* 155*    Lab 06/20/12 0504 06/19/12 1437 06/19/12 1231  HGB 12.4 13.1 12.5  HCT 37.5 39.0 41.8  WBC 13.7* 13.3* 12.9*  PLT 158 178 --   Dg Chest 2 View  06/19/2012  *RADIOLOGY REPORT*  Clinical Data: Fever, right-sided chest pain  CHEST - 2 VIEW  Comparison: None.  Findings: Normal cardiac silhouette.  There  is a large right effusion at the right lung base.  There is a large smoothly marginated density along the right mediastinum superior to the hilum.  This could represent the esophagus or loculated fluid. Cannot exclude a paramediastinal mass.  There is a loculated fluid collection along the right horizontal fissure.  No pneumothorax. Left pacemaker in place.  IMPRESSION:  1.  Large right effusion. 2.  Paramediastinal mass superior to the right hilum.  Differential includes loculated fluid,  postsurgical esophagus,  or paramediastinal mass.  Recommend CT thorax with contrast for further evaluation. 3. Loculated fluid along the right horizontal fissure.   Original Report Authenticated By: Genevive Bi, M.D.    Ct Chest W Contrast  06/19/2012  *RADIOLOGY REPORT*  Clinical  Data: Shortness of breath.  Abnormal chest x-ray.  CT CHEST WITH CONTRAST  Technique:  Multidetector CT imaging of the chest was performed following the standard protocol during bolus administration of intravenous contrast.  Contrast:  80 ml Omnipaque-300  Comparison: Chest x-ray from earlier today  Findings: The thyroid gland is markedly enlarged heterogeneous with several nodular areas.  No axillary lymphadenopathy.  No mediastinal or hilar lymphadenopathy.  The  The esophagus is markedly dilated and tortuous, measuring up to 6 cm in diameter above the level of the carina.  Below the level of the carina, the esophagus measures up to 5.1 cm in diameter.  Left-sided permanent pacemaker noted.  Heart size is normal.  No pericardial effusion.  No left pleural effusion.  The patient does have a moderate right pleural effusion with fluid in the major fissure which accounts for the appearance on the chest x-ray earlier today.  Lung windows show some consolidative change in the central right lower lobe.  No focal consolidation in the left lung.Bone windows reveal no worrisome lytic or sclerotic osseous lesions.  Images which include the upper abdomen show a 12 mm heavily calcified saccular aneurysm of the splenic artery in the hilum.  IMPRESSION: Right pleural effusion with fluid in the fissure accounting for the mass-like appearance of the right lung base.  Markedly dilated, tortuous esophagus filled with fluid and food debris. Achalasia or distal esophageal obstruction could produce this appearance.  Markedly enlarged and nodular thyroid gland.  Thyroid ultrasound could be used to further evaluate.   Original Report Authenticated By: Kennith Center, M.D.     ASSESSMENT / PLAN:  Right effusion in 78 yo never  smoker-Not enough fluid to tap. Other causes of SOB may esophogeal due to to dilatation  -Swallow evaluation -GI evaluation of esophagus  -Empiric abx -PCCM available PRN.  Pleural effusion is not enough  for a thora and highly doubtful to be the reason for her respiratory failure.  It is much more likely to be related to her aspiration form a dilated esophagus and hiatal hernia.  I agree with abx inorder to address aspiration, levaquin should be adequate.  But ultimately, will need either surgery or GI to address the hernia or address code status.  Swallow evaluation would be appropriate as well.  PCCM will be available PRN, please call back if needed.  Patient seen and examined, agree with above note.  I dictated the care and orders written for this patient under my direction.  Koren Bound, M.D. [phone]

## 2012-06-20 NOTE — Consult Note (Signed)
Pt was seen and examined.  Xrays reviewed.  Full note to follow.  The patient has a long-standing history of dysphagia to solids and liquids. She vomits undigested food and has mild odynophagia as well. She complains of pyrosis.  CT demonstrates a  megaesophagus.  Findings are suspicious for a primary esophageal motility disorder such as achalasia. She could have primary or secondary achalasia. She apparently has been seen at [facility] and evaluated. We will review her prior records. Recommend no GI studies at this time until her pulmonary status improves.

## 2012-06-20 NOTE — Consult Note (Signed)
Referring Provider: No ref. provider found Primary Care Physician:  [name], MD Primary Gastroenterologist:  ? Dr. Genevie Ann at [facility]  Reason for Consultation:  Abnormal CT showing dilated esophagus  HPI: [name] is a 78 y.o. female with past medical history polymyalgia rheumatica as well as pacemaker who for the last 2 days had progressively worsening shortness of breath.  She came to the ED at [facility] for further evaluation and was found to have a mild leukocytosis of 12-13 and a chest x-ray noted a large right pleural effusion with some loculated fluid along the right horizontal fissure in the paramediastinal mass superior to the right hilum.  CT scan of the chest also showed "Markedly dilated, tortuous esophagus filled with fluid and food debris. Achalasia or distal esophageal obstruction could produce this appearance."  Therefore, GI was consulted for further evaluation.  Patient states that she was seen at [facility] for this issue and had an EGD by Dr. Genevie Ann about a year ago.  She says that she was told that she has a problem with her peristalsis because she is old.  Says that she was seen there recently as well.  Complains of long-standing history of dysphagia to solids and liquids.  She vomits undigested food and has some mild pain with swallowing.  Positive heartburn.     Past Medical History  Diagnosis Date  . Arthritis   . GERD (gastroesophageal reflux disease)   . Cataract   . Blood transfusion without reported diagnosis     Past Surgical History  Procedure Date  . Appendectomy   . Joint replacement     Prior to Admission medications   Medication Sig Start Date End Date Taking? Authorizing Provider  b complex vitamins tablet Take 1 tablet by mouth daily.   Yes Historical Provider, MD  hydrochlorothiazide (HYDRODIURIL) 25 MG tablet Take 25 mg by mouth daily.   Yes Historical Provider, MD  metoprolol succinate (TOPROL-XL) 25 MG 24 hr tablet Take 25 mg by mouth daily.  Verified that patient takes XL formulation by medication bottle.   Yes Historical Provider, MD  predniSONE (DELTASONE) 5 MG tablet Take 5 mg by mouth daily.   Yes Historical Provider, MD  vitamin C (ASCORBIC ACID) 500 MG tablet Take 500 mg by mouth daily.   Yes Historical Provider, MD  Vitamins A & D (VITAMIN A & D PO) Take 1 capsule by mouth daily.   Yes Historical Provider, MD    Current Facility-Administered Medications  Medication Dose Route Frequency Provider Last Rate Last Dose  . 0.9 %  sodium chloride infusion  250 mL Intravenous PRN Hollice Espy, MD      . acetaminophen (TYLENOL) tablet 650 mg  650 mg Oral Q6H PRN Hollice Espy, MD       Or  . acetaminophen (TYLENOL) suppository 650 mg  650 mg Rectal Q6H PRN Hollice Espy, MD      . hydrochlorothiazide (HYDRODIURIL) tablet 25 mg  25 mg Oral Daily Hollice Espy, MD   25 mg at 06/20/12 1054  . [COMPLETED] HYDROmorphone (DILAUDID) injection 1 mg  1 mg Intravenous Once Gerhard Munch, MD   1 mg at 06/19/12 1632  . [EXPIRED] iohexol (OMNIPAQUE) 300 MG/ML solution 80 mL  80 mL Intravenous Once PRN Medication Radiologist, MD      . levofloxacin (LEVAQUIN) IVPB 500 mg  500 mg Intravenous Q24H Hollice Espy, MD   500 mg at 06/19/12 2048  . metoprolol tartrate (LOPRESSOR)  tablet 25 mg  25 mg Oral Daily Hollice Espy, MD   25 mg at 06/20/12 1054  . morphine 2 MG/ML injection 1 mg  1 mg Intravenous Q4H PRN Hollice Espy, MD   1 mg at 06/20/12 0420  . [COMPLETED] ondansetron (ZOFRAN) injection 4 mg  4 mg Intravenous Once Gerhard Munch, MD   4 mg at 06/19/12 1630  . [EXPIRED] ondansetron (ZOFRAN) injection 4 mg  4 mg Intravenous Q8H PRN Gerhard Munch, MD      . oxyCODONE (Oxy IR/ROXICODONE) immediate release tablet 5 mg  5 mg Oral Q4H PRN Hollice Espy, MD   5 mg at 06/20/12 0119  . pantoprazole (PROTONIX) injection 40 mg  40 mg Intravenous Q12H Ripudeep K Rai, MD   40 mg at 06/20/12 1149  . predniSONE  (DELTASONE) tablet 5 mg  5 mg Oral Daily Hollice Espy, MD   5 mg at 06/20/12 1054  . sodium chloride 0.9 % injection 3 mL  3 mL Intravenous Q12H Hollice Espy, MD   3 mL at 06/20/12 1000  . sodium chloride 0.9 % injection 3 mL  3 mL Intravenous PRN Hollice Espy, MD      . [DISCONTINUED] HYDROmorphone (DILAUDID) injection 1 mg  1 mg Intravenous Q2H PRN Gerhard Munch, MD      . [DISCONTINUED] pantoprazole (PROTONIX) EC tablet 40 mg  40 mg Oral Daily Hollice Espy, MD   40 mg at 06/19/12 2048    Allergies as of 06/19/2012 - Review Complete 06/19/2012  Allergen Reaction Noted  . Actonel (risedronate sodium)  06/19/2012  . Ivp dye (iodinated diagnostic agents) Nausea And Vomiting 06/19/2012    Family History  Problem Relation Age of Onset  . Arthritis Father   . Heart disease Father   . Cancer Brother   . Heart disease Brother   . Diabetes Son     History   Social History  . Marital Status: Married    Spouse Name: N/A    Number of Children: N/A  . Years of Education: N/A   Occupational History  . Not on file.   Social History Main Topics  . Smoking status: Never Smoker   . Smokeless tobacco: Never Used  . Alcohol Use: Yes  . Drug Use: No  . Sexually Active: Yes     Comment: married   Other Topics Concern  . Not on file   Social History Narrative  . No narrative on file    Review of Systems: Ten point ROS is O/w negative except as mentioned in HPI/  Physical Exam: Vital signs in last 24 hours: Temp:  [98.3 F (36.8 C)-100.7 F (38.2 C)] 98.6 F (37 C) (12/10 0645) Pulse Rate:  [92-113] 95  (12/10 0645) Resp:  [18-22] 20  (12/10 0645) BP: (105-137)/(50-66) 119/50 mmHg (12/10 0645) SpO2:  [92 %-97 %] 95 % (12/10 0645) Weight:  [169 lb 5 oz (76.8 kg)] 169 lb 5 oz (76.8 kg) (12/09 1845) Last BM Date: 06/18/12 General:   Alert, Well-developed, well-nourished, pleasant and cooperative, in mild respiratory distress. Head:  Normocephalic and  atraumatic. Eyes:  Sclera clear, no icterus.  Conjunctiva pink. Ears:  Normal auditory acuity. Mouth:  No deformity or lesions.   Lungs:  Decreased breath sounds on the right. Heart:  Regular rate and rhythm; no murmurs, clicks, rubs,  or gallops. Abdomen:  Soft, non-distended, nontender, BS active, nonpalp mass or hsm.   Rectal:  Deferred.  Msk:  Symmetrical without gross deformities. Pulses:  Normal pulses noted. Extremities:  Without clubbing or edema. Neurologic:  Alert and  oriented x4;  grossly normal neurologically. Skin:  Intact without significant lesions or rashes. Psych:  Alert and cooperative. Normal mood and affect.  Intake/Output from previous day: 12/09 0701 - 12/10 0700 In: 340 [P.O.:240; IV Piggyback:100] Out: 700 [Urine:700]  Lab Results:  [facility] 06/20/12 0504 06/19/12 1437 06/19/12 1231  WBC 13.7* 13.3* 12.9*  HGB 12.4 13.1 12.5  HCT 37.5 39.0 41.8  PLT 158 178 --   BMET  Basename 06/20/12 0504 06/19/12 1437  NA 135 138  K 3.6 3.6  CL 97 99  CO2 30 31  GLUCOSE 123* 155*  BUN 18 18  CREATININE 0.67 0.61  CALCIUM 9.4 9.8   LFT  Basename 06/19/12 1437  PROT 6.9  ALBUMIN 3.1*  AST 21  ALT 24  ALKPHOS 122*  BILITOT 0.4  BILIDIR --  IBILI --   Studies/Results: Dg Chest 2 View  06/19/2012  *RADIOLOGY REPORT*  Clinical Data: Fever, right-sided chest pain  CHEST - 2 VIEW  Comparison: None.  Findings: Normal cardiac silhouette.  There is a large right effusion at the right lung base.  There is a large smoothly marginated density along the right mediastinum superior to the hilum.  This could represent the esophagus or loculated fluid. Cannot exclude a paramediastinal mass.  There is a loculated fluid collection along the right horizontal fissure.  No pneumothorax. Left pacemaker in place.  IMPRESSION:  1.  Large right effusion. 2.  Paramediastinal mass superior to the right hilum.  Differential includes loculated fluid,  postsurgical esophagus,  or  paramediastinal mass.  Recommend CT thorax with contrast for further evaluation. 3. Loculated fluid along the right horizontal fissure.   Original Report Authenticated By: Genevive Bi, M.D.    Ct Chest W Contrast  06/19/2012  *RADIOLOGY REPORT*  Clinical Data: Shortness of breath.  Abnormal chest x-ray.  CT CHEST WITH CONTRAST  Technique:  Multidetector CT imaging of the chest was performed following the standard protocol during bolus administration of intravenous contrast.  Contrast:  80 ml Omnipaque-300  Comparison: Chest x-ray from earlier today  Findings: The thyroid gland is markedly enlarged heterogeneous with several nodular areas.  No axillary lymphadenopathy.  No mediastinal or hilar lymphadenopathy.  The  The esophagus is markedly dilated and tortuous, measuring up to 6 cm in diameter above the level of the carina.  Below the level of the carina, the esophagus measures up to 5.1 cm in diameter.  Left-sided permanent pacemaker noted.  Heart size is normal.  No pericardial effusion.  No left pleural effusion.  The patient does have a moderate right pleural effusion with fluid in the major fissure which accounts for the appearance on the chest x-ray earlier today.  Lung windows show some consolidative change in the central right lower lobe.  No focal consolidation in the left lung.Bone windows reveal no worrisome lytic or sclerotic osseous lesions.  Images which include the upper abdomen show a 12 mm heavily calcified saccular aneurysm of the splenic artery in the hilum.  IMPRESSION: Right pleural effusion with fluid in the fissure accounting for the mass-like appearance of the right lung base.  Markedly dilated, tortuous esophagus filled with fluid and food debris. Achalasia or distal esophageal obstruction could produce this appearance.  Markedly enlarged and nodular thyroid gland.  Thyroid ultrasound could be used to further evaluate.   Original Report Authenticated By: Kennith Center, M.D.  IMPRESSION:  -Abnormal CT scan showing "megaesophagus":  Likely a primary esophageal motility disorder, possibly achalasia.  She has been seen and evaluated at [facility] by Dr. Genevie Ann where she was told that she had decreased to no peristalsis.  -Large right pleural effusion for thoracentesis today.   PLAN: -Obtain records from [facility] regarding intervention that has been tried for this issue. -No GI studies or work up for now until records obtained and pulmonary status improved.   Stephine Langbehn D.  06/20/2012, 11:50 AM  Pager number [phone]

## 2012-06-20 NOTE — Care Management Note (Signed)
    Page 1 of 2   06/28/2012     4:16:06 PM   CARE MANAGEMENT NOTE 06/28/2012  Patient:  [name], [name]   Account Number:  [id number]  Date Initiated:  06/20/2012  Documentation initiated by:  Lanier Clam  Subjective/Objective Assessment:   ADMITTED W/SOB.R PLEURAL EFFUSION;?MASS.     Action/Plan:   FROM [facility]   Anticipated DC Date:  06/28/2012   Anticipated DC Plan:  HOME W HOME HEALTH SERVICES      DC Planning Services  CM consult      [facility] Choice  HOME HEALTH   Choice offered to / List presented to:  C-1 Patient   DME arranged  OXYGEN      DME agency  [facility]     HH arranged  HH-2 PT  HH-3 OT      HH agency  OTHER - SEE NOTE   Status of service:  Completed, signed off Medicare Important Message given?   (If response is "NO", the following Medicare IM given date fields will be blank) Date Medicare IM given:   Date Additional Medicare IM given:    Discharge Disposition:  HOME W HOME HEALTH SERVICES  Per UR Regulation:  Reviewed for med. necessity/level of care/duration of stay  If discussed at Long Length of Stay Meetings, dates discussed:   06/27/2012    Comments:  06/28/12 Rosalita Chessman [phone] PT REFUSES SNF PLACEMENT.  WANTS DC HOME WITH HOME HEALTH FOLLOW UP.  WILL NEED PT/OT AND HOME O2.  REFERRAL TO [facility] FOR HOME O2 SET UP.  PT PREFERS HH SET UP WITH [facility], CONTRACTED PROVIDER WITH [facility].  PER SW, PT ONLY NEEDS MD RX FOR PT/OT FOR THIS TO BE ARRANGED. DR SHORT HAS GIVEN RX FOR PT/OT TO PT.  PORTABLE TANK TO PT'S ROOM PRIOR TO DC.  DAUGHTER TO TRANSPORT PT HOME.  06/27/12 Karo Rog,RN,BSN [phone] PHYS THERAPY RECOMMENDING SNF AT DC.  CSW FOLLOWING TO FACILITATE THIS LOC AT DC.  WILL FOLLOW.   06/20/12 KATHY MAHABIR RN,BSN NCM [phone]

## 2012-06-20 NOTE — Progress Notes (Signed)
Patient ID: [name]  female  ZOX:[id number]    DOB: 27-Jul-1928    DOA: 06/19/2012  PCP: [name], MD  Assessment/Plan: Principal Problem:  *Pleural effusion with possible mass in the right lung base: ?loculated effusion - CT chest reviewed, Pulmonology consulted by Dr. Rito Ehrlich, patient will need diagnostic and therapeutic thoracentesis to rule out any malignancy. I will defer the decision for possible bronchoscopy if it is even feasible for the biopsy of the ?mass. - cont levaquin  Active Problems:  Polymyalgia rheumatica: cont pain control, on steroids   Pacemaker  HTN: stable  ? Dilated esophagus vs obstruction vs achalasia: - place on PPI, GI consulted   DVT Prophylaxis: SCD's  Code Status: FC  Disposition: patient is resident of [facility] [address], will DC when medically ready    Subjective: States breathing a little bit better today, awaiting thoracentesis. Pain controlled  Objective: Weight change:   Intake/Output Summary (Last 24 hours) at 06/20/12 1029 Last data filed at 06/20/12 0500  Gross per 24 hour  Intake    340 ml  Output    700 ml  Net   -360 ml   Blood pressure 119/50, pulse 95, temperature 98.6 F (37 C), temperature source Oral, resp. rate 20, height 5\' 2"  (1.575 m), weight 76.8 kg (169 lb 5 oz), SpO2 95.00%.  Physical Exam: General: Alert and awake, oriented x3, not in any acute distress. HEENT: anicteric sclera, pupils reactive to light and accommodation, EOMI CVS: S1-S2 clear, no murmur rubs or gallops Chest: Decreased breath sound Rt>>left Abdomen: soft nontender, nondistended, normal bowel sounds, no organomegaly Extremities: no cyanosis, clubbing or edema noted bilaterally Neuro: Cranial nerves II-XII intact, no focal neurological deficits  Lab Results: Basic Metabolic Panel:  Lab 06/20/12 8119 06/19/12 1437  NA 135 138  K 3.6 3.6  CL 97 99  CO2 30 31  GLUCOSE 123* 155*  BUN 18 18  CREATININE 0.67 0.61  CALCIUM 9.4  9.8  MG -- --  PHOS -- --   Liver Function Tests:  Lab 06/19/12 1437  AST 21  ALT 24  ALKPHOS 122*  BILITOT 0.4  PROT 6.9  ALBUMIN 3.1*   No results found for this basename: LIPASE:2,AMYLASE:2 in the last 168 hours No results found for this basename: AMMONIA:2 in the last 168 hours CBC:  Lab 06/20/12 0504 06/19/12 1437  WBC 13.7* 13.3*  NEUTROABS -- 12.1*  HGB 12.4 13.1  HCT 37.5 39.0  MCV 96.4 95.1  PLT 158 178   Cardiac Enzymes:  Lab 06/19/12 1437  CKTOTAL --  CKMB --  CKMBINDEX --  TROPONINI <0.30   BNP: No components found with this basename: POCBNP:2 CBG: No results found for this basename: GLUCAP:5 in the last 168 hours   Micro Results: No results found for this or any previous visit (from the past 240 hour(s)).  Studies/Results: Dg Chest 2 View  06/19/2012  *RADIOLOGY REPORT*  Clinical Data: Fever, right-sided chest pain  CHEST - 2 VIEW  Comparison: None.  Findings: Normal cardiac silhouette.  There is a large right effusion at the right lung base.  There is a large smoothly marginated density along the right mediastinum superior to the hilum.  This could represent the esophagus or loculated fluid. Cannot exclude a paramediastinal mass.  There is a loculated fluid collection along the right horizontal fissure.  No pneumothorax. Left pacemaker in place.  IMPRESSION:  1.  Large right effusion. 2.  Paramediastinal mass superior to the right  hilum.  Differential includes loculated fluid,  postsurgical esophagus,  or paramediastinal mass.  Recommend CT thorax with contrast for further evaluation. 3. Loculated fluid along the right horizontal fissure.   Original Report Authenticated By: Genevive Bi, M.D.    Ct Chest W Contrast  06/19/2012  *RADIOLOGY REPORT*  Clinical Data: Shortness of breath.  Abnormal chest x-ray.  CT CHEST WITH CONTRAST  Technique:  Multidetector CT imaging of the chest was performed following the standard protocol during bolus administration  of intravenous contrast.  Contrast:  80 ml Omnipaque-300  Comparison: Chest x-ray from earlier today  Findings: The thyroid gland is markedly enlarged heterogeneous with several nodular areas.  No axillary lymphadenopathy.  No mediastinal or hilar lymphadenopathy.  The  The esophagus is markedly dilated and tortuous, measuring up to 6 cm in diameter above the level of the carina.  Below the level of the carina, the esophagus measures up to 5.1 cm in diameter.  Left-sided permanent pacemaker noted.  Heart size is normal.  No pericardial effusion.  No left pleural effusion.  The patient does have a moderate right pleural effusion with fluid in the major fissure which accounts for the appearance on the chest x-ray earlier today.  Lung windows show some consolidative change in the central right lower lobe.  No focal consolidation in the left lung.Bone windows reveal no worrisome lytic or sclerotic osseous lesions.  Images which include the upper abdomen show a 12 mm heavily calcified saccular aneurysm of the splenic artery in the hilum.  IMPRESSION: Right pleural effusion with fluid in the fissure accounting for the mass-like appearance of the right lung base.  Markedly dilated, tortuous esophagus filled with fluid and food debris. Achalasia or distal esophageal obstruction could produce this appearance.  Markedly enlarged and nodular thyroid gland.  Thyroid ultrasound could be used to further evaluate.   Original Report Authenticated By: Kennith Center, M.D.     Medications: Scheduled Meds:   . hydrochlorothiazide  25 mg Oral Daily  . [COMPLETED]  HYDROmorphone (DILAUDID) injection  1 mg Intravenous Once  . levofloxacin (LEVAQUIN) IV  500 mg Intravenous Q24H  . metoprolol tartrate  25 mg Oral Daily  . [COMPLETED] ondansetron (ZOFRAN) IV  4 mg Intravenous Once  . pantoprazole  40 mg Oral Daily  . predniSONE  5 mg Oral Daily  . sodium chloride  3 mL Intravenous Q12H      LOS: 1 day   Bartow Zylstra  M.D. [facility] 06/20/2012, 10:29 AM Pager: [phone]  If 7PM-7AM, please contact night-coverage [email or web address] Password [email or web address]

## 2012-06-21 DIAGNOSIS — Z950 Presence of cardiac pacemaker: Secondary | ICD-10-CM

## 2012-06-21 LAB — PRO B NATRIURETIC PEPTIDE: Pro B Natriuretic peptide (BNP): 222.6 pg/mL (ref 0–450)

## 2012-06-21 MED ORDER — ALBUTEROL SULFATE (5 MG/ML) 0.5% IN NEBU: 2.5000 mg | INHALATION_SOLUTION | Freq: Four times a day (QID) | RESPIRATORY_TRACT | Status: DC | PRN

## 2012-06-21 MED ORDER — ONDANSETRON HCL 4 MG PO TABS: 4.0000 mg | ORAL_TABLET | Freq: Three times a day (TID) | ORAL | Status: DC | PRN

## 2012-06-21 MED ORDER — ALBUTEROL SULFATE (5 MG/ML) 0.5% IN NEBU
2.5000 mg | INHALATION_SOLUTION | Freq: Four times a day (QID) | RESPIRATORY_TRACT | Status: DC
  Administered 2012-06-21: 2.5 mg via RESPIRATORY_TRACT
  Filled 2012-06-21: qty 0.5

## 2012-06-21 MED ORDER — DOCUSATE SODIUM 100 MG PO CAPS
100.0000 mg | ORAL_CAPSULE | Freq: Two times a day (BID) | ORAL | Status: DC
  Administered 2012-06-21 – 2012-06-27 (×10): 100 mg via ORAL
  Filled 2012-06-21 (×14): qty 1

## 2012-06-21 NOTE — Progress Notes (Signed)
I have personally taken an interval history, reviewed the chart, and examined the patient.  I agree with the extender's note, impression and recommendations.  Maudean Hoffmann D. Raine Elsass, MD, FACG [facility] [phone]  

## 2012-06-21 NOTE — Progress Notes (Signed)
Patient breathing at a rate of 30. Patient shows no signs of distress and does not feel SOB. Head of bed is elevated and patient is on 3L of oxygen via [place]. NP on call was notified. RN will continue to monitor closely.

## 2012-06-21 NOTE — Plan of Care (Signed)
Problem: Phase II Progression Outcomes Goal: Progress activity as tolerated unless otherwise ordered Outcome: Progressing Patient is currently on strict bed rest

## 2012-06-21 NOTE — Progress Notes (Signed)
Pt was unsure of her home cpap settings.  Placed pt on nasal cpap autotitration mode 5-20cm h2o with 3l o2 ble[place].  RN notified.  Pt is tolerating well at this time.  Sterile water added to max fill line of humidity chamber.

## 2012-06-21 NOTE — Progress Notes (Signed)
[facility] PROGRESS NOTE  Jill WISSER JWJ:[id number] DOB: Jun 20, 1929 DOA: 06/19/2012 PCP: [name], MD  Assessment/Plan:  1-Pleural effusion Pulmonology consulted by Dr. Rito Ehrlich. - cont levaquin  -Reviewed CT with Radiologist :Right pleural effusion with fluid in the fissure accounting for the mass-like appearance of the right lung base. Peri spinal Mass on Chest x ray likely esophageal dilation by CT chest. -Dr Oswaldo Conroy does not recommend thoracentesis.  -I will repeat chest x ray.  2-Dyspnea: This could be secondary to pulmonary process, infection, ? PNA, aspiration. Continue with levaquin. I will add PRN albuterol. Will order ECHO and BNP.   3-? Dilated esophagus vs obstruction vs achalasia:  Note from [facility] said presbyesophagus, and esophageal diverticulum.  BID PPI. Patient might need endoscopy, will defer to GI.  Zofran for vomiting.  4-Polymyalgia rheumatica: cont pain control, on steroids  Pacemaker  HTN: stable     Code Status: Full Code. Family Communication: Family at bedside, care discussed with them. Disposition Plan: Home when stable.   Consultants:  CCM  Procedures: None  Antibiotics:  Levaquin 12-09  HPI/Subjective: Patient still complaining of dyspnea at rest and on exertion. She relates  Difficulty with swallowing and GERD like symptoms. She relates right side ribs pain when she cough or breath.   Objective: Filed Vitals:   06/21/12 0510 06/21/12 0610 06/21/12 0939 06/21/12 1403  BP: 130/64  132/71 153/75  Pulse: 107  102 102  Temp: 98.4 F (36.9 C)   98.9 F (37.2 C)  TempSrc: Oral   Oral  Resp: 30 24  18   Height:      Weight:      SpO2: 94%   94%    Intake/Output Summary (Last 24 hours) at 06/21/12 2025 Last data filed at 06/21/12 1900  Gross per 24 hour  Intake    483 ml  Output   1075 ml  Net   -592 ml   Filed Weights   06/19/12 1845  Weight: 76.8 kg (169 lb 5 oz)    Exam:   General:  No  Distress.  Cardiovascular: S,1, S2 RRR  Respiratory: crackles, no wheezes  Abdomen: BS present, soft, NT  Data Reviewed: Basic Metabolic Panel:  Lab 06/20/12 6213 06/19/12 1437  NA 135 138  K 3.6 3.6  CL 97 99  CO2 30 31  GLUCOSE 123* 155*  BUN 18 18  CREATININE 0.67 0.61  CALCIUM 9.4 9.8  MG -- --  PHOS -- --   Liver Function Tests:  Lab 06/19/12 1437  AST 21  ALT 24  ALKPHOS 122*  BILITOT 0.4  PROT 6.9  ALBUMIN 3.1*   No results found for this basename: LIPASE:5,AMYLASE:5 in the last 168 hours No results found for this basename: AMMONIA:5 in the last 168 hours CBC:  Lab 06/20/12 0504 06/19/12 1437 06/19/12 1231  WBC 13.7* 13.3* 12.9*  NEUTROABS -- 12.1* --  HGB 12.4 13.1 12.5  HCT 37.5 39.0 41.8  MCV 96.4 95.1 101.1*  PLT 158 178 --   Cardiac Enzymes:  Lab 06/19/12 1437  CKTOTAL --  CKMB --  CKMBINDEX --  TROPONINI <0.30   BNP (last 3 results)  Basename 06/21/12 1820  PROBNP 222.6   CBG: No results found for this basename: GLUCAP:5 in the last 168 hours  No results found for this or any previous visit (from the past 240 hour(s)).   Studies: No results found.  Scheduled Meds:   . docusate sodium  100 mg Oral BID  .  hydrochlorothiazide  25 mg Oral Daily  . levofloxacin (LEVAQUIN) IV  500 mg Intravenous Q24H  . metoprolol tartrate  25 mg Oral Daily  . pantoprazole (PROTONIX) IV  40 mg Intravenous Q12H  . predniSONE  5 mg Oral Daily  . sodium chloride  3 mL Intravenous Q12H   Continuous Infusions:   Principal Problem:  *Pleural effusion Active Problems:  Polymyalgia rheumatica  Pacemaker  Mediastinal mass  Megaesophagus  Aspiration into respiratory tract    Time spent: 35 minutes.    REGALADO,BELKYS  [facility] Pager [phone]. If 8PM-8AM, please contact night-coverage at [email or web address], password [facility] 06/21/2012, 8:25 PM  LOS: 2 days

## 2012-06-21 NOTE — Progress Notes (Signed)
[facility] Progress Note  Subjective:  Still short of breath.  Decision has been made not to tap fluid in lung because there is not enough present.  Objective:  Vital signs in last 24 hours: Temp:  [98.4 F (36.9 C)-99.4 F (37.4 C)] 98.4 F (36.9 C) (12/11 0510) Pulse Rate:  [92-107] 107  (12/11 0510) Resp:  [18-30] 24  (12/11 0610) BP: (107-130)/(56-64) 130/64 mmHg (12/11 0510) SpO2:  [94 %-97 %] 94 % (12/11 0510) Last BM Date: 06/18/12 General:   Alert, Well-developed, in NAD Heart:  Regular rate and rhythm; no murmurs Pulm:  Borderline tachy with regular rhythm; no M/R/G. Abdomen:  Soft, nontender and nondistended. Normal bowel sounds, without guarding, and without rebound.   Extremities:  Without edema. Neurologic:  Alert and  oriented x4;  grossly normal neurologically. Psych:  Alert and cooperative. Normal mood and affect.  Intake/Output from previous day: 12/10 0701 - 12/11 0700 In: 723 [P.O.:720; I.V.:3] Out: 1100 [Urine:1100]  Lab Results:  Basename 06/20/12 0504 06/19/12 1437 06/19/12 1231  WBC 13.7* 13.3* 12.9*  HGB 12.4 13.1 12.5  HCT 37.5 39.0 41.8  PLT 158 178 --   BMET  Basename 06/20/12 0504 06/19/12 1437  NA 135 138  K 3.6 3.6  CL 97 99  CO2 30 31  GLUCOSE 123* 155*  BUN 18 18  CREATININE 0.67 0.61  CALCIUM 9.4 9.8   LFT  Basename 06/19/12 1437  PROT 6.9  ALBUMIN 3.1*  AST 21  ALT 24  ALKPHOS 122*  BILITOT 0.4  BILIDIR --  IBILI --   Dg Chest 2 View  06/19/2012  *RADIOLOGY REPORT*  Clinical Data: Fever, right-sided chest pain  CHEST - 2 VIEW  Comparison: None.  Findings: Normal cardiac silhouette.  There is a large right effusion at the right lung base.  There is a large smoothly marginated density along the right mediastinum superior to the hilum.  This could represent the esophagus or loculated fluid. Cannot exclude a paramediastinal mass.  There is a loculated fluid collection along the right horizontal fissure.  No  pneumothorax. Left pacemaker in place.  IMPRESSION:  1.  Large right effusion. 2.  Paramediastinal mass superior to the right hilum.  Differential includes loculated fluid,  postsurgical esophagus,  or paramediastinal mass.  Recommend CT thorax with contrast for further evaluation. 3. Loculated fluid along the right horizontal fissure.   Original Report Authenticated By: Genevive Bi, M.D.    Ct Chest W Contrast  06/19/2012  *RADIOLOGY REPORT*  Clinical Data: Shortness of breath.  Abnormal chest x-ray.  CT CHEST WITH CONTRAST  Technique:  Multidetector CT imaging of the chest was performed following the standard protocol during bolus administration of intravenous contrast.  Contrast:  80 ml Omnipaque-300  Comparison: Chest x-ray from earlier today  Findings: The thyroid gland is markedly enlarged heterogeneous with several nodular areas.  No axillary lymphadenopathy.  No mediastinal or hilar lymphadenopathy.  The  The esophagus is markedly dilated and tortuous, measuring up to 6 cm in diameter above the level of the carina.  Below the level of the carina, the esophagus measures up to 5.1 cm in diameter.  Left-sided permanent pacemaker noted.  Heart size is normal.  No pericardial effusion.  No left pleural effusion.  The patient does have a moderate right pleural effusion with fluid in the major fissure which accounts for the appearance on the chest x-ray earlier today.  Lung windows show some consolidative change in the central right lower lobe.  No focal consolidation in the left lung.Bone windows reveal no worrisome lytic or sclerotic osseous lesions.  Images which include the upper abdomen show a 12 mm heavily calcified saccular aneurysm of the splenic artery in the hilum.  IMPRESSION: Right pleural effusion with fluid in the fissure accounting for the mass-like appearance of the right lung base.  Markedly dilated, tortuous esophagus filled with fluid and food debris. Achalasia or distal esophageal  obstruction could produce this appearance.  Markedly enlarged and nodular thyroid gland.  Thyroid ultrasound could be used to further evaluate.   Original Report Authenticated By: Kennith Center, M.D.     Assessment / Plan: -Abnormal CT scan showing "megaesophagus": Likely a primary esophageal motility disorder, possibly achalasia. She has been seen and evaluated at [facility] by Dr. Genevie Ann where she was told that she had decreased to no peristalsis.  -Large right pleural effusion.  Decision has been made not to perform thoracentesis.  *Obtain records from [facility] regarding intervention that has been tried for this issue.  AP is working on this for me.  *No GI studies or work up for now until records obtained and pulmonary status improved.    LOS: 2 days   Adasia Hoar D.  06/21/2012, 9:17 AM  Pager number [phone]

## 2012-06-22 ENCOUNTER — Inpatient Hospital Stay (HOSPITAL_COMMUNITY): Payer: Medicare Other

## 2012-06-22 DIAGNOSIS — K220 Achalasia of cardia: Secondary | ICD-10-CM

## 2012-06-22 DIAGNOSIS — J869 Pyothorax without fistula: Secondary | ICD-10-CM | POA: Diagnosis present

## 2012-06-22 LAB — BASIC METABOLIC PANEL
BUN: 17 mg/dL (ref 6–23)
CO2: 33 mEq/L — ABNORMAL HIGH (ref 19–32)
Chloride: 94 mEq/L — ABNORMAL LOW (ref 96–112)
Creatinine, Ser: 0.54 mg/dL (ref 0.50–1.10)
GFR calc Af Amer: >90 mL/min (ref >90)
Potassium: 3.5 mEq/L (ref 3.5–5.1)

## 2012-06-22 LAB — CBC
HCT: 36.6 % (ref 36.0–46.0)
Hemoglobin: 12.1 g/dL (ref 12.0–15.0)
MCV: 96.1 fL (ref 78.0–100.0)
RBC: 3.81 MIL/uL — ABNORMAL LOW (ref 3.87–5.11)
RDW: 13.8 % (ref 11.5–15.5)
WBC: 15.9 10*3/uL — ABNORMAL HIGH (ref 4.0–10.5)

## 2012-06-22 LAB — MRSA PCR SCREENING: MRSA by PCR: NEGATIVE

## 2012-06-22 LAB — GLUCOSE, SEROUS FLUID: Glucose, Fluid: 52 mg/dL

## 2012-06-22 MED ORDER — ALBUTEROL SULFATE (5 MG/ML) 0.5% IN NEBU: 2.5000 mg | INHALATION_SOLUTION | RESPIRATORY_TRACT | Status: DC | PRN

## 2012-06-22 MED ORDER — SODIUM CHLORIDE 0.9 % IV SOLN
INTRAVENOUS | Status: DC
  Administered 2012-06-22: 10 mL/h via INTRAVENOUS

## 2012-06-22 MED ORDER — ALBUTEROL SULFATE (5 MG/ML) 0.5% IN NEBU
2.5000 mg | INHALATION_SOLUTION | Freq: Four times a day (QID) | RESPIRATORY_TRACT | Status: DC
  Administered 2012-06-22 – 2012-06-23 (×3): 2.5 mg via RESPIRATORY_TRACT
  Filled 2012-06-22 (×5): qty 0.5

## 2012-06-22 MED ORDER — SODIUM CHLORIDE 0.9 % IV SOLN
1.5000 g | Freq: Four times a day (QID) | INTRAVENOUS | Status: DC
  Administered 2012-06-22 – 2012-06-28 (×23): 1.5 g via INTRAVENOUS
  Filled 2012-06-22 (×29): qty 1.5

## 2012-06-22 NOTE — Progress Notes (Signed)
[facility] PROGRESS NOTE  [name] ZOX:[id number] DOB: 1929/05/13 DOA: 06/19/2012 PCP: [name], MD  Assessment/Plan: 1-Pleural effusion  Pulmonology consulted by Dr. Rito Ehrlich.  - cont levaquin  -Reviewed CT with Radiologist :Right pleural effusion with fluid in the fissure accounting for the mass-like appearance of the right lung base. Peri spinal Mass on Chest x ray likely esophageal dilation by CT chest.  Initially Dr Oswaldo Conroy didn't recommend thoracentesis.   -I repeat chest x ray 12-12 show worsening right side pleural effusion.  -I re consult Pulmonary 12-12, US thoracentesis was recommended. Patient had procedure done today. Dr Sung Amabile recommend cardiothoracic consult and he will transfer patient to [facility]. Patient will be under CCM services. Appreciate Dr Sung Amabile Help. Marland Kitchen 2-Dyspnea: This could be secondary to pulmonary process, infection, ? PNA, aspiration. Continue with levaquin. I will add PRN albuterol. Will order ECHO pending. 3-? Dilated esophagus vs obstruction vs achalasia:  Note from [facility] said presbyesophagus, and esophageal diverticulum.  BID PPI. Patient might need endoscopy, will defer to GI.  Zofran for vomiting.  4-Polymyalgia rheumatica: cont pain control, on steroids  Pacemaker  HTN: stable    Code Status: Full Code.  Family Communication: care discussed withPatient.  Disposition Plan: Home when stable.  Consultants:  CCM Procedures:  None  Antibiotics:  Levaquin 12-09   HPI/Subjective: Patient feeling some improvement of SOB. Still with difficulty with swallowing.   Objective: Filed Vitals:   06/21/12 2319 06/22/12 0454 06/22/12 0938 06/22/12 1300  BP:  132/64  129/62  Pulse:  95  91  Temp:  99 F (37.2 C)  99.3 F (37.4 C)  TempSrc:  Oral  Oral  Resp: 24 24  21   Height:      Weight:      SpO2:  93% 94% 96%    Intake/Output Summary (Last 24 hours) at 06/22/12 1945 Last data filed at 06/22/12 1100  Gross per 24 hour   Intake    220 ml  Output    690 ml  Net   -470 ml   Filed Weights   06/19/12 1845  Weight: 76.8 kg (169 lb 5 oz)    Exam:   General:  No distress.  Cardiovascular: S 1, S 2 RRR  Respiratory:decreases breath sound right, bilateral crackles.  Abdomen: BS present, soft, NT  Data Reviewed: Basic Metabolic Panel:  Lab 06/22/12 8119 06/20/12 0504 06/19/12 1437  NA 136 135 138  K 3.5 3.6 3.6  CL 94* 97 99  CO2 33* 30 31  GLUCOSE 136* 123* 155*  BUN 17 18 18   CREATININE 0.54 0.67 0.61  CALCIUM 9.5 9.4 9.8  MG -- -- --  PHOS -- -- --   Liver Function Tests:  Lab 06/19/12 1437  AST 21  ALT 24  ALKPHOS 122*  BILITOT 0.4  PROT 6.9  ALBUMIN 3.1*   No results found for this basename: LIPASE:5,AMYLASE:5 in the last 168 hours No results found for this basename: AMMONIA:5 in the last 168 hours CBC:  Lab 06/22/12 0433 06/20/12 0504 06/19/12 1437 06/19/12 1231  WBC 15.9* 13.7* 13.3* 12.9*  NEUTROABS -- -- 12.1* --  HGB 12.1 12.4 13.1 12.5  HCT 36.6 37.5 39.0 41.8  MCV 96.1 96.4 95.1 101.1*  PLT 169 158 178 --   Cardiac Enzymes:  Lab 06/19/12 1437  CKTOTAL --  CKMB --  CKMBINDEX --  TROPONINI <0.30   BNP (last 3 results)  Basename 06/21/12 1820  PROBNP 222.6   CBG: No  results found for this basename: GLUCAP:5 in the last 168 hours  No results found for this or any previous visit (from the past 240 hour(s)).   Studies: Dg Chest 1 View  06/22/2012  *RADIOLOGY REPORT*  Clinical Data: Status post right-sided thoracentesis.  CHEST - 1 VIEW  Comparison: Chest x-ray 06/22/2012.  Findings: Compared to the prior examination, there is slight decrease in right-sided pleural effusion, however, this remains large.  No definite pneumothorax.  Trace left pleural effusion. Poorer aeration in the right lung may reflect a passive atelectasis and/or air space consolidation.  Unusual lucency in the region of the mediastinum is likely to represent the patient's very dilated  esophagus, likely with some internal food or debris.  Heart size is within normal limits. The patient is rotated to the left on today's exam, resulting in distortion of the mediastinal contours and reduced diagnostic sensitivity and specificity for mediastinal pathology.  Atherosclerosis in the thoracic aorta.  Left-sided pacemaker device in place with lead tips projecting over expected location of the right atrium and right ventricular apex.  Large air fluid level projecting over the lower mediastinum, likely from a large esophageal hiatal hernia.  IMPRESSION: 1.  Overall, the radiographic appearance of chest is similar to the prior study, with exception of slight decrease in size of large right-sided pleural effusion following thoracentesis.  No pneumothorax or other complicating features.   Original Report Authenticated By: Trudie Reed, M.D.    Dg Chest 2 View  06/22/2012  *RADIOLOGY REPORT*  Clinical Data: Shortness of breath and weakness.  CHEST - 2 VIEW  Comparison: CT and plain films of the chest 06/19/2012.  Findings: Large right pleural effusion has markedly increased with associated progression of airspace disease.  There now appears to be a small to moderate left pleural effusion.  Dilatation of the esophagus with food in the upper esophagus as seen on CT scan is again identified.  Heart size is upper normal.  IMPRESSION:  1.  Marked increase in right pleural effusion and airspace disease. There is now a small to moderate left pleural effusion as well. 2.  Markedly dilated esophagus containing food material again seen.   Original Report Authenticated By: Holley Dexter, M.D.    US Thoracentesis Asp Pleural Space W/img Guide  06/22/2012  *RADIOLOGY REPORT*  Clinical Data:  Shortness of breath, worsening right-sided pleural effusion  ULTRASOUND GUIDED right THORACENTESIS  Comparison:  None  An ultrasound guided thoracentesis was thoroughly discussed with the patient and questions answered.   The benefits, risks, alternatives and complications were also discussed.  The patient understands and wishes to proceed with the procedure.  Written consent was obtained.  Ultrasound was performed to localize and mark an adequate pocket of fluid in the right chest.  The effusion is noted to be densely loculated and the lung atelectatic.  A target zone of the effusion was selcted and the area was then prepped and draped in the normal sterile fashion.  1% Lidocaine was used for local anesthesia. Under ultrasound guidance a 19 gauge Yueh catheter was introduced. Thoracentesis was performed.  The catheter was removed and a dressing applied.  Complications:  None immediate  Findings: A total of approximately 45 ml of slightly hazy yellow fluid was removed. A fluid sample was sent for laboratory analysis.  IMPRESSION: 1. Ultrasound guided right thoracentesis yielding 45 ml of pleural fluid.  2. Given the innumerable septations within the complex right-sided pleural effusion, the patient may benefit from referral to thoracic surgery  for consideration of surgical management of this complex pleural effusion.  Read by Brayton El PA-C   Original Report Authenticated By: D. Andria Rhein, MD     Scheduled Meds:   . albuterol  2.5 mg Nebulization Q6H WA  . ampicillin-sulbactam (UNASYN) IV  1.5 g Intravenous Q6H  . docusate sodium  100 mg Oral BID  . hydrochlorothiazide  25 mg Oral Daily  . metoprolol tartrate  25 mg Oral Daily  . pantoprazole (PROTONIX) IV  40 mg Intravenous Q12H  . predniSONE  5 mg Oral Daily   Continuous Infusions:   Principal Problem:  *Empyema Active Problems:  Polymyalgia rheumatica  Pacemaker  Megaesophagus  Aspiration into respiratory tract    Time spent: 25 minutes    Sury Wentworth  [facility] Pager [phone]. If 8PM-8AM, please contact night-coverage at [email or web address], password [facility] 06/22/2012, 7:45 PM  LOS: 3 days

## 2012-06-22 NOTE — Progress Notes (Addendum)
SPEECH PATHOLOGY   Order received for Bedside Swallow Evaluation.  Chart reviewed.  Phone and text paged Ordering physician to discuss, but no calls returned.  Patient has  significant esophageal issues, including "megaesophagus" and achalasia with probable aspiration due to primary esophageal dysphagia.  GI is following patient and awaiting reports from [facility] to determine what treatment has been attempted in the past.    There is likely nothing new that a Bedside Swallow Evaluation by SLP can do in this case.  Once the esophageal issues have improved, a MBS may possible be helpful to decrease aspiration risks during p.o. Intake.    Plan:  Defer to GI.  Please reorder when GI has addressed esophageal dysphagia and approves P.O's.  Maryjo Rochester T   Addedum:  Note that patient is on a regular diet , and radiologist reports esophagus with food residue throughout on CXR.  Phoned GI PA to ask if diet should be changed to liquids or purees only, to reduce aspiration risks from reflux/backflow of food in the esophagus.  PA reports MD will f/u and address today.  Thanks, Maryjo Rochester, CCC-SLP

## 2012-06-22 NOTE — Progress Notes (Signed)
Report was called to [facility] on 2300 at [facility]. [facility] was called for transportation to room 2306.

## 2012-06-22 NOTE — Consult Note (Signed)
PULMONARY  / CRITICAL CARE MEDICINE  Name: [name] MRN: [id number] DOB: [birth year]    LOS: 3  REFERRING PROVIDER:  [facility]  CHIEF COMPLAINT:  Dyspnea   BRIEF PATIENT DESCRIPTION:  78 yo WF with SOB with activity.  LINES / TUBES:   CULTURES:   ANTIBIOTICS: 12-9 Levaquin>>  SIGNIFICANT EVENTS:  12-9 ct with rt pleural effusion  HISTORY OF PRESENT ILLNESS:   78 yo with increased sob over several weeks and was transported to [facility] and found to have moderate rt effusion.PCCM asked to evaluate for thoracentesis.  Re consulted 12-12 for    INTERVAL HISTORY:   VITAL SIGNS: Temp:  [98.4 F (36.9 C)-99.3 F (37.4 C)] 99.3 F (37.4 C) (12/12 1300) Pulse Rate:  [91-104] 91  (12/12 1300) Resp:  [21-30] 21  (12/12 1300) BP: (129-133)/(62-64) 129/62 mmHg (12/12 1300) SpO2:  [93 %-96 %] 96 % (12/12 1300)  PHYSICAL EXAMINATION: General:  EWW NAD @ rest. 12-12 breathing no worse  Neuro:  Intact HEENT:  No lan Neck:  No jvd Cardiovascular:  hsd Lungs:  Decreased rt 1/4 down Abdomen: +bs Musculoskeletal:  intact Skin:  warm   Lab 06/22/12 0433 06/20/12 0504 06/19/12 1437  NA 136 135 138  K 3.5 3.6 3.6  CL 94* 97 99  CO2 33* 30 31  BUN 17 18 18   CREATININE 0.54 0.67 0.61  GLUCOSE 136* 123* 155*    Lab 06/22/12 0433 06/20/12 0504 06/19/12 1437  HGB 12.1 12.4 13.1  HCT 36.6 37.5 39.0  WBC 15.9* 13.7* 13.3*  PLT 169 158 178   Dg Chest 2 View  06/22/2012  *RADIOLOGY REPORT*  Clinical Data: Shortness of breath and weakness.  CHEST - 2 VIEW  Comparison: CT and plain films of the chest 06/19/2012.  Findings: Large right pleural effusion has markedly increased with associated progression of airspace disease.  There now appears to be a small to moderate left pleural effusion.  Dilatation of the esophagus with food in the upper esophagus as seen on CT scan is again identified.  Heart size is upper normal.  IMPRESSION:  1.  Marked increase in right pleural effusion and  airspace disease. There is now a small to moderate left pleural effusion as well. 2.  Markedly dilated esophagus containing food material again seen.   Original Report Authenticated By: Holley Dexter, M.D.     ASSESSMENT / PLAN:  Right effusion in 78 yo never smoker with increased right effusion but complicated by megaesophagus with impending EGD per GI. She is not more sob currently. Therefore:  -Have Interventional Radiology evaluate rt pleural effusion with high gain [place] and do thoracentesis if possible. -SenD fluid for:    LDH,PROTEIN,CYTOLOGY,CHOLESTEROL.[facility] AND [facility] [place]     Brett Canales Minor ACNP Adolph Pollack PCCM Pager [phone] till 3 pm If no answer page [phone] 06/22/2012, 2:10 PM   I have interviewed and examined the patient and reviewed the database. I have formulated the assessment and plan as reflected in the note above with amendments made by me. The R effusion is multiloculated and minimally accessible by US thoracentesis with only 45 cc of fluid removed - described as hazy, yellow. This is likely empyema or para-pneumonic related to aspiration (in light of her esophageal dysmotility).  There is no good way to resolve her pleural disease process short of surgical intervention of some sort. It is notable that she was highly functional and independent prior to the onset of this current illness. I have spoken with Dr  Gerhardt. We will transfer to [facility] under PCCM service for his eval.  I have spoken with Dr Arlyce Dice and we will put plans for EGD on hold until after the pleural process is addressed.   Will change abx from Levoflox to amp/sulbactam for better aspiration coverage  Billy Fischer, MD;  PCCM service; Mobile [phone]

## 2012-06-22 NOTE — Progress Notes (Signed)
[facility] Progress Note  Subjective:  Repeat chest xray still showed marked dilated esophagus containing food material.  Concern for aspiration, especially on a regular diet.  Objective:  Vital signs in last 24 hours: Temp:  [98.4 F (36.9 C)-99.3 F (37.4 C)] 99.3 F (37.4 C) (12/12 1300) Pulse Rate:  [91-104] 91  (12/12 1300) Resp:  [18-30] 21  (12/12 1300) BP: (129-153)/(62-75) 129/62 mmHg (12/12 1300) SpO2:  [93 %-96 %] 96 % (12/12 1300) Last BM Date: 06/22/12 General:   Alert,  Well-developed, in NAD Heart:  Regular rate and rhythm; no murmurs Pulm:  Decreased breath sounds on the right. Abdomen:  Soft, nontender and nondistended. Normal bowel sounds, without guarding, and without rebound.   Extremities:  Without edema. Neurologic:  Alert and  oriented x4;  grossly normal neurologically. Psych:  Alert and cooperative. Normal mood and affect.  Intake/Output from previous day: 12/11 0701 - 12/12 0700 In: 680 [P.O.:570; IV Piggyback:110] Out: 990 [Urine:990] Intake/Output this shift: Total I/O In: 120 [P.O.:120] Out: 250 [Urine:250]  Lab Results:  [facility] 06/22/12 0433 06/20/12 0504 06/19/12 1437  WBC 15.9* 13.7* 13.3*  HGB 12.1 12.4 13.1  HCT 36.6 37.5 39.0  PLT 169 158 178   BMET  Basename 06/22/12 0433 06/20/12 0504 06/19/12 1437  NA 136 135 138  K 3.5 3.6 3.6  CL 94* 97 99  CO2 33* 30 31  GLUCOSE 136* 123* 155*  BUN 17 18 18   CREATININE 0.54 0.67 0.61  CALCIUM 9.5 9.4 9.8   LFT  Basename 06/19/12 1437  PROT 6.9  ALBUMIN 3.1*  AST 21  ALT 24  ALKPHOS 122*  BILITOT 0.4  BILIDIR --  IBILI --   Dg Chest 2 View  06/22/2012  *RADIOLOGY REPORT*  Clinical Data: Shortness of breath and weakness.  CHEST - 2 VIEW  Comparison: CT and plain films of the chest 06/19/2012.  Findings: Large right pleural effusion has markedly increased with associated progression of airspace disease.  There now appears to be a small to moderate left pleural  effusion.  Dilatation of the esophagus with food in the upper esophagus as seen on CT scan is again identified.  Heart size is upper normal.  IMPRESSION:  1.  Marked increase in right pleural effusion and airspace disease. There is now a small to moderate left pleural effusion as well. 2.  Markedly dilated esophagus containing food material again seen.   Original Report Authenticated By: Holley Dexter, M.D.     Assessment / Plan: -Abnormal CT scan showing "megaesophagus": Likely a primary esophageal motility disorder, possibly achalasia.  According to notes from [facility], her symptoms have significantly worsened since she was last seen there in September.  -Large right pleural effusion. ? Thoracentesis today.  *EGD with Botox injection tomorrow, 12/13.    LOS: 3 days   ZEHR, JESSICA D.  06/22/2012, 1:33 PM  Pager number [phone]  Discussed with Dr. Darrol Angel.  EGD will be deferred because pt is having a thoracotomy tomorrow.  Will f/u with patient next week.

## 2012-06-22 NOTE — Procedures (Signed)
Successful US guided right thoracentesis. Effusion is densely loculated yielding low volume of 45cc of sl hazy yellow fluid. Pt tolerated procedure well. No immediate complications.  Specimen was sent for labs as ordered CXR ordered.  Brayton El PA-C 06/22/2012 4:12 PM

## 2012-06-23 ENCOUNTER — Inpatient Hospital Stay (HOSPITAL_COMMUNITY): Payer: Medicare Other

## 2012-06-23 ENCOUNTER — Encounter (HOSPITAL_COMMUNITY): Payer: Self-pay | Admitting: Anesthesiology

## 2012-06-23 ENCOUNTER — Inpatient Hospital Stay (HOSPITAL_COMMUNITY): Payer: Medicare Other | Admitting: Anesthesiology

## 2012-06-23 ENCOUNTER — Encounter (HOSPITAL_COMMUNITY)
Admission: EM | Disposition: A | Discharge status: Home Health | Payer: Self-pay | Source: Home / Self Care | Attending: Pulmonary Disease

## 2012-06-23 DIAGNOSIS — J869 Pyothorax without fistula: Secondary | ICD-10-CM

## 2012-06-23 HISTORY — PX: VIDEO ASSISTED THORACOSCOPY (VATS)/DECORTICATION: SHX6171

## 2012-06-23 HISTORY — PX: VIDEO BRONCHOSCOPY: SHX5072

## 2012-06-23 LAB — URINALYSIS, ROUTINE W REFLEX MICROSCOPIC
Glucose, UA: NEGATIVE mg/dL
Ketones, ur: 15 mg/dL — AB
Leukocytes, UA: NEGATIVE
Nitrite: NEGATIVE
Protein, ur: 30 mg/dL — AB
Specific Gravity, Urine: 1.029 (ref 1.005–1.030)
Urobilinogen, UA: 2 mg/dL — ABNORMAL HIGH (ref 0.0–1.0)
pH: 6 (ref 5.0–8.0)

## 2012-06-23 LAB — CBC
HCT: 36.2 % (ref 36.0–46.0)
HCT: 34.8 % — ABNORMAL LOW (ref 36.0–46.0)
Hemoglobin: 12.1 g/dL (ref 12.0–15.0)
Hemoglobin: 11.9 g/dL — ABNORMAL LOW (ref 12.0–15.0)
MCH: 31.8 pg (ref 26.0–34.0)
MCH: 32.4 pg (ref 26.0–34.0)
MCHC: 33.4 g/dL (ref 30.0–36.0)
MCHC: 34.2 g/dL (ref 30.0–36.0)
MCV: 95 fL (ref 78.0–100.0)
MCV: 94.8 fL (ref 78.0–100.0)
Platelets: 192 10*3/uL (ref 150–400)
Platelets: 186 10*3/uL (ref 150–400)
RBC: 3.81 MIL/uL — ABNORMAL LOW (ref 3.87–5.11)
RBC: 3.67 MIL/uL — ABNORMAL LOW (ref 3.87–5.11)
RDW: 14 % (ref 11.5–15.5)
RDW: 14 % (ref 11.5–15.5)
WBC: 12.9 10*3/uL — ABNORMAL HIGH (ref 4.0–10.5)
WBC: 12.1 10*3/uL — ABNORMAL HIGH (ref 4.0–10.5)

## 2012-06-23 LAB — COMPREHENSIVE METABOLIC PANEL
ALT: 16 U/L (ref 0–35)
ALT: 15 U/L (ref 0–35)
AST: 20 U/L (ref 0–37)
AST: 18 U/L (ref 0–37)
Albumin: 2.4 g/dL — ABNORMAL LOW (ref 3.5–5.2)
Albumin: 2.3 g/dL — ABNORMAL LOW (ref 3.5–5.2)
Alkaline Phosphatase: 130 U/L — ABNORMAL HIGH (ref 39–117)
Alkaline Phosphatase: 129 U/L — ABNORMAL HIGH (ref 39–117)
BUN: 15 mg/dL (ref 6–23)
BUN: 15 mg/dL (ref 6–23)
CO2: 36 mEq/L — ABNORMAL HIGH (ref 19–32)
CO2: 36 mEq/L — ABNORMAL HIGH (ref 19–32)
Calcium: 9.9 mg/dL (ref 8.4–10.5)
Calcium: 9.6 mg/dL (ref 8.4–10.5)
Chloride: 94 mEq/L — ABNORMAL LOW (ref 96–112)
Chloride: 95 mEq/L — ABNORMAL LOW (ref 96–112)
Creatinine, Ser: 0.5 mg/dL (ref 0.50–1.10)
Creatinine, Ser: 0.43 mg/dL — ABNORMAL LOW (ref 0.50–1.10)
GFR calc Af Amer: >90 mL/min (ref >90)
GFR calc Af Amer: >90 mL/min (ref >90)
GFR calc non Af Amer: 88 mL/min — ABNORMAL LOW (ref >90)
GFR calc non Af Amer: >90 mL/min (ref >90)
Glucose, Bld: 146 mg/dL — ABNORMAL HIGH (ref 70–99)
Glucose, Bld: 138 mg/dL — ABNORMAL HIGH (ref 70–99)
Potassium: 3.2 mEq/L — ABNORMAL LOW (ref 3.5–5.1)
Potassium: 3.1 mEq/L — ABNORMAL LOW (ref 3.5–5.1)
Sodium: 139 mEq/L (ref 135–145)
Sodium: 140 mEq/L (ref 135–145)
Total Bilirubin: 0.5 mg/dL (ref 0.3–1.2)
Total Bilirubin: 0.4 mg/dL (ref 0.3–1.2)
Total Protein: 6.5 g/dL (ref 6.0–8.3)
Total Protein: 6.2 g/dL (ref 6.0–8.3)

## 2012-06-23 LAB — BLOOD GAS, ARTERIAL
Acid-Base Excess: 11.7 mmol/L — ABNORMAL HIGH (ref 0.0–2.0)
Bicarbonate: 35.9 mEq/L — ABNORMAL HIGH (ref 20.0–24.0)
Drawn by: 277331
O2 Content: 3 L/min
O2 Saturation: 97.9 %
Patient temperature: 98.6
TCO2: 37.4 mmol/L (ref 0–100)
pCO2 arterial: 47.9 mmHg — ABNORMAL HIGH (ref 35.0–45.0)
pH, Arterial: 7.487 — ABNORMAL HIGH (ref 7.350–7.450)
pO2, Arterial: 83.2 mmHg (ref 80.0–100.0)

## 2012-06-23 LAB — PROTIME-INR
INR: 1.2 (ref 0.00–1.49)
Prothrombin Time: 15 seconds (ref 11.6–15.2)

## 2012-06-23 LAB — BASIC METABOLIC PANEL
CO2: 33 mEq/L — ABNORMAL HIGH (ref 19–32)
Calcium: 9.3 mg/dL (ref 8.4–10.5)
Creatinine, Ser: 0.4 mg/dL — ABNORMAL LOW (ref 0.50–1.10)
GFR calc non Af Amer: >90 mL/min (ref >90)
Glucose, Bld: 153 mg/dL — ABNORMAL HIGH (ref 70–99)
Sodium: 137 mEq/L (ref 135–145)

## 2012-06-23 LAB — BODY FLUID CELL COUNT WITH DIFFERENTIAL
Lymphs, Fluid: 32 %
Monocyte-Macrophage-Serous Fluid: 4 % — ABNORMAL LOW (ref 50–90)
Neutrophil Count, Fluid: 64 % — ABNORMAL HIGH (ref 0–25)
Total Nucleated Cell Count, Fluid: 110 cu mm (ref 0–1000)

## 2012-06-23 LAB — URINE MICROSCOPIC-ADD ON

## 2012-06-23 LAB — APTT: aPTT: 29 seconds (ref 24–37)

## 2012-06-23 LAB — GLUCOSE, CAPILLARY
Glucose-Capillary: 137 mg/dL — ABNORMAL HIGH (ref 70–99)
Glucose-Capillary: 125 mg/dL — ABNORMAL HIGH (ref 70–99)

## 2012-06-23 LAB — CHOLESTEROL, BODY FLUID: Cholesterol, Fluid: 84 mg/dL

## 2012-06-23 LAB — PH, BODY FLUID: pH, Fluid: 7.5

## 2012-06-23 LAB — AMYLASE, BODY FLUID

## 2012-06-23 LAB — TYPE AND SCREEN
ABO/RH(D): A POS
Antibody Screen: NEGATIVE

## 2012-06-23 LAB — PROTEIN, BODY FLUID: Total protein, fluid: 3.9 g/dL

## 2012-06-23 SURGERY — EGD (ESOPHAGOGASTRODUODENOSCOPY)
Anesthesia: Moderate Sedation

## 2012-06-23 SURGERY — BRONCHOSCOPY, VIDEO-ASSISTED
Anesthesia: General | Site: Chest | Laterality: Right | Wound class: Clean Contaminated

## 2012-06-23 MED ORDER — DEXTROSE 5 % IV SOLN
1.5000 g | Freq: Two times a day (BID) | INTRAVENOUS | Status: AC
  Administered 2012-06-24 (×2): 1.5 g via INTRAVENOUS
  Filled 2012-06-23 (×2): qty 1.5

## 2012-06-23 MED ORDER — POTASSIUM CHLORIDE 10 MEQ/50ML IV SOLN
10.0000 meq | Freq: Every day | INTRAVENOUS | Status: DC | PRN
  Administered 2012-06-23 – 2012-06-26 (×12): 10 meq via INTRAVENOUS
  Filled 2012-06-23: qty 50
  Filled 2012-06-23: qty 150
  Filled 2012-06-23 (×4): qty 50
  Filled 2012-06-23: qty 150
  Filled 2012-06-23 (×2): qty 50

## 2012-06-23 MED ORDER — OXYCODONE HCL 5 MG PO TABS: 5.0000 mg | ORAL_TABLET | ORAL | Status: AC | PRN

## 2012-06-23 MED ORDER — INSULIN ASPART 100 UNIT/ML ~~LOC~~ SOLN
0.0000 [IU] | SUBCUTANEOUS | Status: DC
  Administered 2012-06-23 (×3): 1 [IU] via SUBCUTANEOUS
  Administered 2012-06-24: 2 [IU] via SUBCUTANEOUS
  Administered 2012-06-24 – 2012-06-25 (×4): 1 [IU] via SUBCUTANEOUS
  Administered 2012-06-25: 2 [IU] via SUBCUTANEOUS
  Administered 2012-06-25: 1 [IU] via SUBCUTANEOUS
  Administered 2012-06-25: 2 [IU] via SUBCUTANEOUS

## 2012-06-23 MED ORDER — FENTANYL CITRATE 0.05 MG/ML IJ SOLN
50.0000 ug | INTRAMUSCULAR | Status: DC | PRN
  Administered 2012-06-23: 50 ug via INTRAVENOUS

## 2012-06-23 MED ORDER — ROCURONIUM BROMIDE 100 MG/10ML IV SOLN
INTRAVENOUS | Status: DC | PRN
  Administered 2012-06-23: 20 mg via INTRAVENOUS
  Administered 2012-06-23: 30 mg via INTRAVENOUS
  Administered 2012-06-23: 20 mg via INTRAVENOUS
  Administered 2012-06-23: 10 mg via INTRAVENOUS

## 2012-06-23 MED ORDER — LIDOCAINE HCL (CARDIAC) 20 MG/ML IV SOLN
INTRAVENOUS | Status: DC | PRN
  Administered 2012-06-23: 50 mg via INTRAVENOUS

## 2012-06-23 MED ORDER — ONDANSETRON HCL 4 MG/2ML IJ SOLN: 4.0000 mg | Freq: Four times a day (QID) | INTRAMUSCULAR | Status: DC | PRN

## 2012-06-23 MED ORDER — MIDAZOLAM HCL 2 MG/2ML IJ SOLN
1.0000 mg | INTRAMUSCULAR | Status: DC | PRN
  Administered 2012-06-23: 2 mg via INTRAVENOUS
  Filled 2012-06-23: qty 2

## 2012-06-23 MED ORDER — ONDANSETRON HCL 4 MG/2ML IJ SOLN
4.0000 mg | Freq: Four times a day (QID) | INTRAMUSCULAR | Status: DC | PRN
  Administered 2012-06-25: 4 mg via INTRAVENOUS
  Filled 2012-06-23: qty 2

## 2012-06-23 MED ORDER — OXYCODONE-ACETAMINOPHEN 5-325 MG PO TABS
1.0000 | ORAL_TABLET | ORAL | Status: DC | PRN
  Administered 2012-06-25 – 2012-06-26 (×2): 1 via ORAL
  Administered 2012-06-26: 2 via ORAL
  Administered 2012-06-26: 1 via ORAL
  Filled 2012-06-23: qty 2
  Filled 2012-06-23 (×2): qty 1
  Filled 2012-06-23: qty 2

## 2012-06-23 MED ORDER — PROPOFOL 10 MG/ML IV BOLUS
INTRAVENOUS | Status: DC | PRN
  Administered 2012-06-23: 30 mg via INTRAVENOUS
  Administered 2012-06-23: 170 mg via INTRAVENOUS

## 2012-06-23 MED ORDER — DEXTROSE-NACL 5-0.45 % IV SOLN
INTRAVENOUS | Status: DC
  Administered 2012-06-23: 21:00:00 via INTRAVENOUS

## 2012-06-23 MED ORDER — HYDROCORTISONE SOD SUCCINATE 100 MG IJ SOLR: 25.0000 mg | Freq: Every day | INTRAMUSCULAR | Status: DC

## 2012-06-23 MED ORDER — DIPHENHYDRAMINE HCL 50 MG/ML IJ SOLN: 12.5000 mg | Freq: Four times a day (QID) | INTRAMUSCULAR | Status: DC | PRN

## 2012-06-23 MED ORDER — ONDANSETRON HCL 4 MG/2ML IJ SOLN
INTRAMUSCULAR | Status: DC | PRN
  Administered 2012-06-23: 4 mg via INTRAVENOUS

## 2012-06-23 MED ORDER — SODIUM CHLORIDE 0.9 % IJ SOLN: 9.0000 mL | INTRAMUSCULAR | Status: DC | PRN

## 2012-06-23 MED ORDER — FENTANYL CITRATE 0.05 MG/ML IJ SOLN
25.0000 ug | INTRAMUSCULAR | Status: DC | PRN
  Administered 2012-06-23: 50 ug via INTRAVENOUS
  Filled 2012-06-23: qty 2

## 2012-06-23 MED ORDER — HYDROCORTISONE SOD SUCCINATE 100 MG IJ SOLR
25.0000 mg | Freq: Every day | INTRAMUSCULAR | Status: DC
  Administered 2012-06-23 – 2012-06-26 (×4): 25 mg via INTRAVENOUS
  Filled 2012-06-23 (×4): qty 0.5

## 2012-06-23 MED ORDER — PROPOFOL 10 MG/ML IV EMUL
5.0000 ug/kg/min | INTRAVENOUS | Status: DC
  Administered 2012-06-23: 20 ug/kg/min via INTRAVENOUS
  Filled 2012-06-23: qty 100

## 2012-06-23 MED ORDER — LABETALOL HCL 5 MG/ML IV SOLN
10.0000 mg | INTRAVENOUS | Status: DC | PRN
  Administered 2012-06-24: 10 mg via INTRAVENOUS
  Filled 2012-06-23 (×2): qty 4

## 2012-06-23 MED ORDER — FENTANYL 10 MCG/ML IV SOLN
INTRAVENOUS | Status: DC
  Filled 2012-06-23: qty 50

## 2012-06-23 MED ORDER — LACTATED RINGERS IV SOLN
INTRAVENOUS | Status: DC
  Administered 2012-06-23: 16:00:00 via INTRAVENOUS

## 2012-06-23 MED ORDER — MIDAZOLAM HCL 2 MG/2ML IJ SOLN
INTRAMUSCULAR | Status: AC
  Filled 2012-06-23: qty 2

## 2012-06-23 MED ORDER — LACTATED RINGERS IV SOLN
INTRAVENOUS | Status: DC | PRN
  Administered 2012-06-23: 17:00:00 via INTRAVENOUS

## 2012-06-23 MED ORDER — ACETAMINOPHEN 10 MG/ML IV SOLN
1000.0000 mg | Freq: Four times a day (QID) | INTRAVENOUS | Status: AC
  Administered 2012-06-23 – 2012-06-24 (×4): 1000 mg via INTRAVENOUS
  Filled 2012-06-23 (×4): qty 100

## 2012-06-23 MED ORDER — MIDAZOLAM HCL 2 MG/2ML IJ SOLN
1.0000 mg | INTRAMUSCULAR | Status: DC | PRN
  Administered 2012-06-23: 1 mg via INTRAVENOUS

## 2012-06-23 MED ORDER — FENTANYL CITRATE 0.05 MG/ML IJ SOLN
INTRAMUSCULAR | Status: AC
  Filled 2012-06-23: qty 2

## 2012-06-23 MED ORDER — SODIUM CHLORIDE 0.9 % IV SOLN
1.5000 g | Freq: Once | INTRAVENOUS | Status: DC
  Filled 2012-06-23: qty 1.5

## 2012-06-23 MED ORDER — FENTANYL CITRATE 0.05 MG/ML IJ SOLN
INTRAMUSCULAR | Status: DC | PRN
  Administered 2012-06-23 (×2): 100 ug via INTRAVENOUS
  Administered 2012-06-23: 150 ug via INTRAVENOUS
  Administered 2012-06-23 (×3): 100 ug via INTRAVENOUS
  Administered 2012-06-23 (×2): 50 ug via INTRAVENOUS

## 2012-06-23 MED ORDER — SUCCINYLCHOLINE CHLORIDE 20 MG/ML IJ SOLN
INTRAMUSCULAR | Status: DC | PRN
  Administered 2012-06-23: 100 mg via INTRAVENOUS

## 2012-06-23 MED ORDER — LEVALBUTEROL HCL 0.63 MG/3ML IN NEBU
0.6300 mg | INHALATION_SOLUTION | Freq: Four times a day (QID) | RESPIRATORY_TRACT | Status: DC
  Administered 2012-06-23 – 2012-06-27 (×12): 0.63 mg via RESPIRATORY_TRACT
  Filled 2012-06-23 (×20): qty 3

## 2012-06-23 MED ORDER — FENTANYL CITRATE 0.05 MG/ML IJ SOLN
25.0000 ug | INTRAMUSCULAR | Status: DC | PRN
  Administered 2012-06-24 (×2): 75 ug via INTRAVENOUS
  Administered 2012-06-24: 50 ug via INTRAVENOUS
  Administered 2012-06-24: 75 ug via INTRAVENOUS
  Administered 2012-06-24: 50 ug via INTRAVENOUS
  Filled 2012-06-23 (×4): qty 2

## 2012-06-23 MED ORDER — DIPHENHYDRAMINE HCL 12.5 MG/5ML PO ELIX
12.5000 mg | ORAL_SOLUTION | Freq: Four times a day (QID) | ORAL | Status: DC | PRN
  Filled 2012-06-23: qty 5

## 2012-06-23 MED ORDER — BISACODYL 5 MG PO TBEC
10.0000 mg | DELAYED_RELEASE_TABLET | Freq: Every day | ORAL | Status: DC
  Administered 2012-06-25 – 2012-06-27 (×3): 10 mg via ORAL
  Filled 2012-06-23: qty 2
  Filled 2012-06-23: qty 1
  Filled 2012-06-23: qty 2
  Filled 2012-06-23: qty 1

## 2012-06-23 MED ORDER — NALOXONE HCL 0.4 MG/ML IJ SOLN: 0.4000 mg | INTRAMUSCULAR | Status: DC | PRN

## 2012-06-23 SURGICAL SUPPLY — 63 items
APPLICATOR TIP COSEAL (VASCULAR PRODUCTS) IMPLANT
APPLICATOR TIP EXT COSEAL (VASCULAR PRODUCTS) IMPLANT
BRUSH CYTOL CELLEBRITY 1.5X140 (MISCELLANEOUS) IMPLANT
CANISTER SUCTION 2500CC (MISCELLANEOUS) ×3 IMPLANT
CATH KIT ON Q 5IN SLV (PAIN MANAGEMENT) IMPLANT
CATH THORACIC 28FR (CATHETERS) ×6 IMPLANT
CATH THORACIC 36FR (CATHETERS) IMPLANT
CATH THORACIC 36FR RT ANG (CATHETERS) IMPLANT
CLEANER TIP ELECTROSURG 2X2 (MISCELLANEOUS) ×3 IMPLANT
CLIP TI MEDIUM 6 (CLIP) IMPLANT
CLOTH BEACON ORANGE TIMEOUT ST (SAFETY) ×3 IMPLANT
CONT SPEC 4OZ CLIKSEAL STRL BL (MISCELLANEOUS) ×6 IMPLANT
COVER TABLE BACK 60X90 (DRAPES) ×3 IMPLANT
DRAPE LAPAROSCOPIC ABDOMINAL (DRAPES) ×3 IMPLANT
DRAPE WARM FLUID 44X44 (DRAPE) ×3 IMPLANT
DRILL BIT 7/64X5 (BIT) ×3 IMPLANT
ELECT REM PT RETURN 9FT ADLT (ELECTROSURGICAL) ×3
ELECTRODE REM PT RTRN 9FT ADLT (ELECTROSURGICAL) ×2 IMPLANT
FORCEPS BIOP RJ4 1.8 (CUTTING FORCEPS) IMPLANT
GLOVE BIO SURGEON STRL SZ 6.5 (GLOVE) ×6 IMPLANT
GOWN STRL NON-REIN LRG LVL3 (GOWN DISPOSABLE) ×12 IMPLANT
KIT BASIN OR (CUSTOM PROCEDURE TRAY) ×3 IMPLANT
KIT ROOM TURNOVER OR (KITS) ×3 IMPLANT
KIT SUCTION CATH 14FR (SUCTIONS) ×3 IMPLANT
MARKER SKIN DUAL TIP RULER LAB (MISCELLANEOUS) ×3 IMPLANT
NEEDLE BIOPSY TRANSBRONCH 21G (NEEDLE) IMPLANT
NS IRRIG 1000ML POUR BTL (IV SOLUTION) ×6 IMPLANT
OIL SILICONE PENTAX (PARTS (SERVICE/REPAIRS)) ×3 IMPLANT
PACK CHEST (CUSTOM PROCEDURE TRAY) ×3 IMPLANT
PAD ARMBOARD 7.5X6 YLW CONV (MISCELLANEOUS) ×6 IMPLANT
SCISSORS LAP 5X35 DISP (ENDOMECHANICALS) IMPLANT
SEALANT PROGEL (MISCELLANEOUS) IMPLANT
SEALANT SURG COSEAL 4ML (VASCULAR PRODUCTS) IMPLANT
SEALANT SURG COSEAL 8ML (VASCULAR PRODUCTS) IMPLANT
SOLUTION ANTI FOG 6CC (MISCELLANEOUS) ×3 IMPLANT
SPONGE GAUZE 4X4 12PLY (GAUZE/BANDAGES/DRESSINGS) ×3 IMPLANT
SUT PROLENE 3 0 SH DA (SUTURE) IMPLANT
SUT PROLENE 4 0 RB 1 (SUTURE)
SUT PROLENE 4-0 RB1 .5 CRCL 36 (SUTURE) IMPLANT
SUT SILK  1 MH (SUTURE) ×4
SUT SILK 1 MH (SUTURE) ×8 IMPLANT
SUT SILK 2 0SH CR/8 30 (SUTURE) IMPLANT
SUT SILK 3 0SH CR/8 30 (SUTURE) IMPLANT
SUT STEEL 1 (SUTURE) ×6 IMPLANT
SUT VIC AB 1 CTX 18 (SUTURE) IMPLANT
SUT VIC AB 1 CTX 36 (SUTURE)
SUT VIC AB 1 CTX36XBRD ANBCTR (SUTURE) IMPLANT
SUT VIC AB 2-0 CTX 36 (SUTURE) IMPLANT
SUT VIC AB 3-0 X1 27 (SUTURE) ×6 IMPLANT
SUT VICRYL 2 TP 1 (SUTURE) IMPLANT
SWAB COLLECTION DEVICE MRSA (MISCELLANEOUS) IMPLANT
SYR 20ML ECCENTRIC (SYRINGE) ×3 IMPLANT
SYSTEM SAHARA CHEST DRAIN ATS (WOUND CARE) ×3 IMPLANT
TAPE CLOTH 4X10 WHT NS (GAUZE/BANDAGES/DRESSINGS) ×3 IMPLANT
TAPE CLOTH SOFT 2X10 (GAUZE/BANDAGES/DRESSINGS) ×3 IMPLANT
TIP APPLICATOR SPRAY EXTEND 16 (VASCULAR PRODUCTS) IMPLANT
TOWEL OR 17X24 6PK STRL BLUE (TOWEL DISPOSABLE) ×3 IMPLANT
TOWEL OR 17X26 10 PK STRL BLUE (TOWEL DISPOSABLE) ×6 IMPLANT
TRAP SPECIMEN MUCOUS 40CC (MISCELLANEOUS) ×6 IMPLANT
TRAY FOLEY CATH 14FRSI W/METER (CATHETERS) ×3 IMPLANT
TUBE ANAEROBIC SPECIMEN COL (MISCELLANEOUS) IMPLANT
TUBE CONNECTING 12X1/4 (SUCTIONS) ×3 IMPLANT
WATER STERILE IRR 1000ML POUR (IV SOLUTION) ×6 IMPLANT

## 2012-06-23 NOTE — Anesthesia Postprocedure Evaluation (Signed)
  Anesthesia Post-op Note  Patient: [name]  Procedure(s) Performed: Procedure(s) (LRB) with comments: VIDEO BRONCHOSCOPY (N/A) VIDEO ASSISTED THORACOSCOPY (VATS)/DECORTICATION (Right)  Patient Location: SICU  Anesthesia Type:General  Level of Consciousness: intubated, sedated  Airway and Oxygen Therapy: Patient remains intubated per anesthesia plan  Post-op Pain: mild  Post-op Assessment: Post-op Vital signs reviewed  Post-op Vital Signs: Reviewed  Complications: No apparent anesthesia complications

## 2012-06-23 NOTE — Progress Notes (Signed)
eLink Physician-Brief Progress Note Patient Name: [name] DOB: 30-Dec-1928 MRN: [id number]  Date of Service  06/23/2012   HPI/Events of Note  Call from bedside nurse regarding sedation/pain issues in patient s/p VATS for drainage of an empyema.  Patient is currently on intermittent sedation is alert and c/o of pain.  Her BP is 170/62 (86) with HR of 87.  Anticipate extubation in AM.  eICU Interventions  Plan: Switch to cont sedation with propofol with intermittent fentanyl.  Have increased dose of fentanyl to 25 to 75 mcg Continue to monitor response to sedation   Intervention Category Intermediate Interventions: Pain - evaluation and management Minor Interventions: Agitation / anxiety - evaluation and management  Larisa Lanius 06/23/2012, 11:08 PM

## 2012-06-23 NOTE — Significant Event (Signed)
Pt returned from OR, and remains on ventilator. Will add intermittent sedation protocol while on vent.  Will d/c lactated ringers and continue D5 1/2 NS IV fluid.  Coralyn Helling, MD 06/23/2012, 8:25 PM [phone]

## 2012-06-23 NOTE — Anesthesia Preprocedure Evaluation (Addendum)
Anesthesia Evaluation  Patient identified by MRN, date of birth, ID band Patient awake    Reviewed: Allergy & Precautions, H&P , NPO status , Patient's Chart, lab work & pertinent test results, reviewed documented beta blocker date and time   Airway Mallampati: II TM Distance: >3 FB Neck ROM: full    Dental   Pulmonary shortness of breath, pneumonia -,  breath sounds clear to auscultation        Cardiovascular + pacemaker Rhythm:regular  23-Jun-2012 10:25:12 [facility]-23 ROUTINE RECORD Normal sinus rhythm Right bundle branch block Rightward axis secondary Nonspecific ST abnormality Abnormal ECG since last tracing no significant change   Neuro/Psych negative neurological ROS  negative psych ROS   GI/Hepatic Neg liver ROS, GERD-  Medicated and Controlled,  Endo/Other  negative endocrine ROS  Renal/GU negative Renal ROS  negative genitourinary   Musculoskeletal   Abdominal   Peds  Hematology negative hematology ROS (+)   Anesthesia Other Findings See surgeon's H&P   Reproductive/Obstetrics negative OB ROS                           Anesthesia Physical Anesthesia Plan  ASA: III  Anesthesia Plan: General   Post-op Pain Management:    Induction: Intravenous  Airway Management Planned: Double Lumen EBT  Additional Equipment: Arterial line and CVP  Intra-op Plan:   Post-operative Plan: Extubation in OR  Informed Consent: I have reviewed the patients History and Physical, chart, labs and discussed the procedure including the risks, benefits and alternatives for the proposed anesthesia with the patient or authorized representative who has indicated his/her understanding and acceptance.   Dental Advisory Given  Plan Discussed with: CRNA and Surgeon  Anesthesia Plan Comments:         Anesthesia Quick Evaluation

## 2012-06-23 NOTE — OR Nursing (Signed)
Bronch ended 1540, VATS started 18:18

## 2012-06-23 NOTE — Brief Op Note (Addendum)
06/19/2012 - 06/23/2012  7:04 PM  PATIENT:  [name]  78 y.o. female  PRE-OPERATIVE DIAGNOSIS:  Complex pleural effusion /emphyema  POST-OPERATIVE DIAGNOSIS: same  PROCEDURE:  Procedure(s) (LRB) with comments:  VIDEO BRONCHOSCOPY (N/A)  VIDEO ASSISTED THORACOSCOPY -Drainage of pleural effusion, detortication  SURGEON:  Surgeon(s) and Role:    * Delight Ovens, MD - Primary  PHYSICIAN ASSISTANT: Erin Barrett PA-C  ANESTHESIA:   general  EBL:     BLOOD ADMINISTERED:none  DRAINS: 28 Straight chest tube in right pleural space x2   LOCAL MEDICATIONS USED:  NONE  SPECIMEN:  Source of Specimen:  Right Pleural Fluid, Right Pleural Peel  DISPOSITION OF SPECIMEN:  Cytology, Microbiology  COUNTS:  YES  TOURNIQUET:  * No tourniquets in log *  DICTATION: .Dragon Dictation and Other Dictation: Dictation Number   PLAN OF CARE: Admit to inpatient   PATIENT DISPOSITION:  PACU - hemodynamically stable.   Delay start of Pharmacological VTE agent (>24hrs) due to surgical blood loss or risk of bleeding: yes

## 2012-06-23 NOTE — Consult Note (Signed)
Subjective:   Patient is a 78 y.o. female with history of Polymyalgia Rheumatica and Pacemaker placement.  She presented to the Emergency Department with a 2 day complaint of progressively worsening shortness of breath.  There was no associated wheezing or cough, but patient states just couldn't catch her breath.  She underwent CXR which showed a large right sided pleural effusion, a paramediastinal mass superior to the right hilum, and loculated fluid along the right horizontal fissure.  These findings were concerning for possible malignancy vs infectious process.  The patient was treated with some oxygen and admitted to the Hospitalist's service for further evaluation.  She underwent CT scan which showed pleural fluid in the fissure of the right lung, that they felt was the cause of the mass like appearance.  It also showed some esophageal dilatation and enlarged thyroid nodule.  The patient was on Levaquin for possible pneumonia coverage.  She underwent Thoracentesis which resulted in a low volume drainage of 45cc of hazy yellow fluid, which was sent for cytology and culture.  Therefore, TCTS has been consulted for possible empyema.  Currently, the patient states she feels less short of breath.  She continues to have some complaints of dysphagia.  Of note Gastroenterology has been consulted and has deferred further workup until after patient's Thoracotomy procedure.   Patient Active Problem List   Diagnosis Date Noted  . Empyema 06/22/2012  . Megaesophagus 06/20/2012  . Aspiration into respiratory tract 06/20/2012  . Polymyalgia rheumatica 06/19/2012  . Pacemaker 06/19/2012  . Pleural effusion 06/19/2012  . Mediastinal mass 06/19/2012   Past Medical History  Diagnosis Date  . Arthritis   . GERD (gastroesophageal reflux disease)   . Cataract   . Blood transfusion without reported diagnosis     Past Surgical History  Procedure Date  . Appendectomy   . Joint replacement     Prescriptions  prior to admission  Medication Sig Dispense Refill  . b complex vitamins tablet Take 1 tablet by mouth daily.      . hydrochlorothiazide (HYDRODIURIL) 25 MG tablet Take 25 mg by mouth daily.      . metoprolol succinate (TOPROL-XL) 25 MG 24 hr tablet Take 25 mg by mouth daily. Verified that patient takes XL formulation by medication bottle.      . predniSONE (DELTASONE) 5 MG tablet Take 5 mg by mouth daily.      . vitamin C (ASCORBIC ACID) 500 MG tablet Take 500 mg by mouth daily.      . Vitamins A & D (VITAMIN A & D PO) Take 1 capsule by mouth daily.      . [DISCONTINUED] metoprolol tartrate (LOPRESSOR) 25 MG tablet Take 25 mg by mouth daily.        Allergies  Allergen Reactions  . Actonel (Risedronate Sodium)   . Ivp Dye (Iodinated Diagnostic Agents) Nausea And Vomiting    History  Substance Use Topics  . Smoking status: Never Smoker   . Smokeless tobacco: Never Used  . Alcohol Use: Yes    Family History  Problem Relation Age of Onset  . Arthritis Father   . Heart disease Father   . Cancer Brother   . Heart disease Brother   . Diabetes Son     Review of Systems Constitutional: positive for fevers Respiratory: positive for dyspnea on exertion Cardiovascular: positive for dyspnea Gastrointestinal: positive for dysphagia Hematologic/lymphatic: negative Neurological: negative Endocrine: positive for thyroid nodule  Objective:   Patient Vitals for the past 8  hrs:  BP Temp Temp src Pulse Resp SpO2 Height Weight  06/23/12 0755 - - - - - 97 % - -  06/23/12 0735 - 99.4 F (37.4 C) Oral - - - - -  06/23/12 0700 107/47 mmHg - - 94  26  96 % - -  06/23/12 0600 120/57 mmHg - - 92  25  97 % - -  06/23/12 0500 145/82 mmHg - - 102  25  91 % 5\' 2"  (1.575 m) 164 lb 0.4 oz (74.4 kg)  06/23/12 0415 - 98.7 F (37.1 C) Oral - - - - -  06/23/12 0400 131/67 mmHg - - 95  26  93 % - -  06/23/12 0300 125/63 mmHg - - 92  22  93 % - -  06/23/12 0200 108/85 mmHg - - 98  27  95 % - -  06/23/12  0100 152/71 mmHg - - 109  24  95 % - -   BP 107/47  Pulse 94  Temp 99.4 F (37.4 C) (Oral)  Resp 26  Ht 5\' 2"  (1.575 m)  Wt 164 lb 0.4 oz (74.4 kg)  BMI 30.00 kg/m2  SpO2 97% General appearance: alert, cooperative and no distress Head: Normocephalic, without obvious abnormality, atraumatic Eyes: conjunctivae/corneas clear. PERRL, EOM's intact. Fundi benign. Lungs: diminished breath sounds on right Heart: regular rate and rhythm Abdomen: soft, non-tender; bowel sounds normal; no masses,  no organomegaly Extremities: extremities normal, atraumatic, no cyanosis or edema Skin: Skin color, texture, turgor normal. No rashes or lesions Neurologic: Grossly normal  Data Review: Radiology review: CT Scan  Right pleural effusion with fluid in the fissure accounting for the  mass-like appearance of the right lung base.   Markedly dilated, tortuous esophagus filled with fluid and food  debris. Achalasia or distal esophageal obstruction could produce  this appearance.   Markedly enlarged and nodular thyroid gland. Thyroid ultrasound  could be used to further evaluate.  Chest X-Ray: Right pleural effusion with associated consolidation appears  A/P:  1. Right Pleural Effusion vs. Empyema 2. Esophageal Dilatation with Dysphagia- GI following 3. CV- S/P Pacemaker placement 4. Polymyalgia Rheumatica 5. Dispo- I  has spoken with patient regarding VATS procedure and drainage, and talked to Pulmonary service and patients family. The risks and benefits of the procedure have been explained to the patient and she is agreeable to proceed with surgery.  We will plan for this later this afternoon.  The goals risks and alternatives of the planned surgical procedure Bronch rt VATS and drainage have been discussed with the patient in detail. The risks of the procedure including death, infection, stroke, myocardial infarction, bleeding, blood transfusion have all been discussed specifically.  I have  quoted [name] a 10 % of perioperative mortality and a complication rate as high as 25 %. The patient's questions have been answered.[name] is willing  to proceed with the planned procedure.   I have seen and examined [name] and have formulated with the above assessment  and plan.  Delight Ovens MD Beeper [phone] Office [phone] 06/23/2012 4:00 PM

## 2012-06-23 NOTE — Addendum Note (Signed)
Addendum  created 06/23/12 2154 by Pricilla Holm, CRNA   Modules edited:Anesthesia Blocks and Procedures, Anesthesia LDA, Inpatient Notes

## 2012-06-23 NOTE — Anesthesia Procedure Notes (Addendum)
Procedure Name: Intubation Date/Time: 06/23/2012 5:36 PM Performed by: Gwenyth Allegra Pre-anesthesia Checklist: Emergency Drugs available, Timeout performed, Patient identified, Suction available and Patient being monitored Patient Re-evaluated:Patient Re-evaluated prior to inductionIntubation Type: Rapid sequence and IV induction Laryngoscope Size: Mac and 4 Grade View: Grade I Tube size: 8.0 mm Number of attempts: 1 Airway Equipment and Method: Stylet Placement Confirmation: ETT inserted through vocal cords under direct vision and breath sounds checked- equal and bilateral Secured at: 22 cm Tube secured with: Tape Dental Injury: Teeth and Oropharynx as per pre-operative assessment     Date/Time: 06/23/2012 6:00 PM Performed by: Gwenyth Allegra Pre-anesthesia Checklist: Patient identified, Timeout performed, Emergency Drugs available, Suction available and Patient being monitored Patient Re-evaluated:Patient Re-evaluated prior to inductionOxygen Delivery Method: Circle system utilized Preoxygenation: Pre-oxygenation with 100% oxygen Intubation Type: Combination inhalational/ intravenous induction Endobronchial tube: Double lumen EBT, Left, EBT position confirmed by fiberoptic bronchoscope and EBT position confirmed by auscultation and 37 Fr Number of attempts: 1 Airway Equipment and Method: Bougie stylet Placement Confirmation: positive ETCO2 and breath sounds checked- equal and bilateral Secured at: 27 cm Tube secured with: Tape Difficulty Due To: Difficulty was anticipated    Procedure Name: Intubation Date/Time: 06/23/2012 6:25 PM Performed by: Julianne Rice Z Pre-anesthesia Checklist: Patient identified, Emergency Drugs available, Suction available, Patient being monitored and Timeout performed Patient Re-evaluated:Patient Re-evaluated prior to inductionPreoxygenation: Pre-oxygenation with 100% oxygen Tube type: Subglottic suction tube Tube size: 7.5 mm Number of attempts:  1 Airway Equipment and Method: Stylet (Cook tube exchanger utilized) Placement Confirmation: positive ETCO2 and breath sounds checked- equal and bilateral Secured at: 23 cm Tube secured with: Tape Dental Injury: Teeth and Oropharynx as per pre-operative assessment  Comments: Oral double lumen ETT changed over Cook catheter to Subglottic ETT #7.5; Dr. Randa Evens present

## 2012-06-23 NOTE — Transfer of Care (Signed)
Immediate Anesthesia Transfer of Care Note  Patient: [name]  Procedure(s) Performed: Procedure(s) (LRB) with comments: VIDEO BRONCHOSCOPY (N/A) VIDEO ASSISTED THORACOSCOPY (VATS)/DECORTICATION (Right)  Patient Location: SICU  Anesthesia Type:General  Level of Consciousness: sedated, unresponsive and Patient remains intubated per anesthesia plan  Airway & Oxygen Therapy: Patient remains intubated per anesthesia plan and Patient placed on Ventilator (see vital sign flow sheet for setting)  Post-op Assessment: Post -op Vital signs reviewed and stable  Post vital signs: Reviewed and stable  Complications: No apparent anesthesia complications

## 2012-06-23 NOTE — Consult Note (Signed)
PULMONARY  / CRITICAL CARE MEDICINE  Name: [name] MRN: [id number] DOB: 1928/08/25    LOS: 4  REFERRING PROVIDER:  [facility]  CHIEF COMPLAINT:  Dyspnea   BRIEF PATIENT DESCRIPTION:  78 yo WF with SOB with activity. Effusion, dilated esophagus, PNA  LINES / TUBES:  CULTURES:  ANTIBIOTICS: 12-9 Levaquin>>off 12/12 unasyn>>>  SIGNIFICANT EVENTS:  12-9 ct with rt pleural effusion 12/12 thora 45 cc 12/12 cvts consult  INTERVAL HISTORY:  No distress, does not appear toxic  VITAL SIGNS: Temp:  [98.7 F (37.1 C)-100 F (37.8 C)] 99.4 F (37.4 C) (12/13 0735) Pulse Rate:  [91-109] 101  (12/13 0900) Resp:  [18-29] 24  (12/13 0900) BP: (107-152)/(47-103) 133/62 mmHg (12/13 0900) SpO2:  [90 %-99 %] 95 % (12/13 0900) Weight:  [74.4 kg (164 lb 0.4 oz)] 74.4 kg (164 lb 0.4 oz) (12/13 0500)  PHYSICAL EXAMINATION: General:  No distress in a chair Neuro:  Intact nonfocal HEENT:  obese Neck:  No jvd Cardiovascular:  s1 s2 RRR no r Lungs:  Decreased rt base Abdomen: +bs, no r/g Musculoskeletal:  intact Skin:  warm   Lab 06/23/12 0840 06/22/12 0433 06/20/12 0504  NA 139 136 135  K 3.2* 3.5 3.6  CL 94* 94* 97  CO2 36* 33* 30  BUN 15 17 18   CREATININE 0.50 0.54 0.67  GLUCOSE 146* 136* 123*    Lab 06/23/12 0840 06/22/12 0433 06/20/12 0504  HGB 12.1 12.1 12.4  HCT 36.2 36.6 37.5  WBC 12.9* 15.9* 13.7*  PLT 192 169 158   Dg Chest 1 View  06/22/2012  *RADIOLOGY REPORT*  Clinical Data: Status post right-sided thoracentesis.  CHEST - 1 VIEW  Comparison: Chest x-ray 06/22/2012.  Findings: Compared to the prior examination, there is slight decrease in right-sided pleural effusion, however, this remains large.  No definite pneumothorax.  Trace left pleural effusion. Poorer aeration in the right lung may reflect a passive atelectasis and/or air space consolidation.  Unusual lucency in the region of the mediastinum is likely to represent the patient's very dilated esophagus,  likely with some internal food or debris.  Heart size is within normal limits. The patient is rotated to the left on today's exam, resulting in distortion of the mediastinal contours and reduced diagnostic sensitivity and specificity for mediastinal pathology.  Atherosclerosis in the thoracic aorta.  Left-sided pacemaker device in place with lead tips projecting over expected location of the right atrium and right ventricular apex.  Large air fluid level projecting over the lower mediastinum, likely from a large esophageal hiatal hernia.  IMPRESSION: 1.  Overall, the radiographic appearance of chest is similar to the prior study, with exception of slight decrease in size of large right-sided pleural effusion following thoracentesis.  No pneumothorax or other complicating features.   Original Report Authenticated By: Trudie Reed, M.D.    Dg Chest 2 View  06/22/2012  *RADIOLOGY REPORT*  Clinical Data: Shortness of breath and weakness.  CHEST - 2 VIEW  Comparison: CT and plain films of the chest 06/19/2012.  Findings: Large right pleural effusion has markedly increased with associated progression of airspace disease.  There now appears to be a small to moderate left pleural effusion.  Dilatation of the esophagus with food in the upper esophagus as seen on CT scan is again identified.  Heart size is upper normal.  IMPRESSION:  1.  Marked increase in right pleural effusion and airspace disease. There is now a small to moderate left pleural effusion as  well. 2.  Markedly dilated esophagus containing food material again seen.   Original Report Authenticated By: Holley Dexter, M.D.    Dg Chest Port 1 View  06/23/2012  *RADIOLOGY REPORT*  Clinical Data: Postoperative radiograph  PORTABLE CHEST - 1 VIEW  Comparison: 06/22/2012  Findings: Right pleural effusion is similar to slightly smaller. The associated consolidation is similar to prior.  Prominent cardiomediastinal contours, obscured on the the right by the  above process. The mediastinal air fluid level is no longer visualized however a hiatal hernia may still be present.  Left lung remains similar, with trace effusion or pleural thickening and mild associated airspace opacities superimposed on a background interstitial prominence.  Aortic arch atherosclerosis.  No definite pneumothorax.  No acute osseous finding. Left chest wall battery pack with incompletely imaged leads.  IMPRESSION:  Right pleural effusion with associated consolidation appears similar to slightly smaller.   Original Report Authenticated By: Jearld Lesch, M.D.    US Thoracentesis Asp Pleural Space W/img Guide  06/22/2012  *RADIOLOGY REPORT*  Clinical Data:  Shortness of breath, worsening right-sided pleural effusion  ULTRASOUND GUIDED right THORACENTESIS  Comparison:  None  An ultrasound guided thoracentesis was thoroughly discussed with the patient and questions answered.  The benefits, risks, alternatives and complications were also discussed.  The patient understands and wishes to proceed with the procedure.  Written consent was obtained.  Ultrasound was performed to localize and mark an adequate pocket of fluid in the right chest.  The effusion is noted to be densely loculated and the lung atelectatic.  A target zone of the effusion was selcted and the area was then prepped and draped in the normal sterile fashion.  1% Lidocaine was used for local anesthesia. Under ultrasound guidance a 19 gauge Yueh catheter was introduced. Thoracentesis was performed.  The catheter was removed and a dressing applied.  Complications:  None immediate  Findings: A total of approximately 45 ml of slightly hazy yellow fluid was removed. A fluid sample was sent for laboratory analysis.  IMPRESSION: 1. Ultrasound guided right thoracentesis yielding 45 ml of pleural fluid.  2. Given the innumerable septations within the complex right-sided pleural effusion, the patient may benefit from referral to thoracic  surgery for consideration of surgical management of this complex pleural effusion.  Read by Brayton El PA-C   Original Report Authenticated By: D. Andria Rhein, MD     ASSESSMENT / PLAN:  Right effusion in 78 yo never smoker with increased right effusion but complicated by megaesophagus  Pulm: A: rt pna / effusion, r/o aspiration from dilated esophagus / reflux, r/o empyema ( not toxic appearing) -Have reviewed CT , pcxr with cvts -chest seems complicated, recommend VATS -continue unasyn, low threshold add vanc and change to zosyn if declines -pcxr to follow -ppi bid -In future will need GI recs botox etc>? -IS -will eval post op , may need ett / vent then  GI: A: herniated stomach, Esoph in chest , dilated -PPI BID -NPO Gi recs to follow in future  Renal: A: hypokalemia P: replace K  Maintenance fluids Chem in am   Endo: A: pred dep for PMR Plan: Change pred to hydrocortisone equivalent dose, escalate post op if hemodynamics of concern  Will re eval post op  Mcarthur Rossetti. Tyson Alias, MD, FACP Pgr: [phone] [facility]

## 2012-06-23 NOTE — Preoperative (Signed)
Beta Blockers   Pt took Toprol 25 mgs PO on        @

## 2012-06-24 ENCOUNTER — Inpatient Hospital Stay (HOSPITAL_COMMUNITY): Payer: Medicare Other

## 2012-06-24 LAB — POCT I-STAT 3, ART BLOOD GAS (G3+)
Bicarbonate: 36.7 mEq/L — ABNORMAL HIGH (ref 20.0–24.0)
pCO2 arterial: 47.3 mmHg — ABNORMAL HIGH (ref 35.0–45.0)
pH, Arterial: 7.499 — ABNORMAL HIGH (ref 7.350–7.450)
pO2, Arterial: 107 mmHg — ABNORMAL HIGH (ref 80.0–100.0)

## 2012-06-24 LAB — COMPREHENSIVE METABOLIC PANEL
ALT: 11 U/L (ref 0–35)
AST: 15 U/L (ref 0–37)
Albumin: 2 g/dL — ABNORMAL LOW (ref 3.5–5.2)
Alkaline Phosphatase: 116 U/L (ref 39–117)
BUN: 16 mg/dL (ref 6–23)
Chloride: 100 mEq/L (ref 96–112)
Potassium: 3.3 mEq/L — ABNORMAL LOW (ref 3.5–5.1)
Sodium: 141 mEq/L (ref 135–145)
Total Bilirubin: 0.5 mg/dL (ref 0.3–1.2)
Total Protein: 5.3 g/dL — ABNORMAL LOW (ref 6.0–8.3)

## 2012-06-24 LAB — GLUCOSE, CAPILLARY
Glucose-Capillary: 126 mg/dL — ABNORMAL HIGH (ref 70–99)
Glucose-Capillary: 125 mg/dL — ABNORMAL HIGH (ref 70–99)

## 2012-06-24 LAB — CBC WITH DIFFERENTIAL/PLATELET
Basophils Relative: 0 % (ref 0–1)
Hemoglobin: 10.7 g/dL — ABNORMAL LOW (ref 12.0–15.0)
MCHC: 33.8 g/dL (ref 30.0–36.0)
Monocytes Relative: 6 % (ref 3–12)
Neutro Abs: 11.6 10*3/uL — ABNORMAL HIGH (ref 1.7–7.7)
Neutrophils Relative %: 87 % — ABNORMAL HIGH (ref 43–77)
Platelets: 168 10*3/uL (ref 150–400)
RBC: 3.37 MIL/uL — ABNORMAL LOW (ref 3.87–5.11)

## 2012-06-24 NOTE — Progress Notes (Signed)
TCTS BRIEF SICU PROGRESS NOTE  1 Day Post-Op  S/P Procedure(s) (LRB): VIDEO BRONCHOSCOPY (N/A) VIDEO ASSISTED THORACOSCOPY (VATS)/DECORTICATION (Right)   Stable day.  Extubated uneventfully.  Plan: Continue current plan  Purcell Nails 06/24/2012 5:39 PM

## 2012-06-24 NOTE — Progress Notes (Signed)
Pt placed back on SIMV due to periods of apnea, low Vte and for sedation to wear off. RT will reattempt wean shortly. RT will continue to monitor.

## 2012-06-24 NOTE — Plan of Care (Signed)
Problem: Phase II Progression Outcomes Goal: Tolerating diet Outcome: Not Progressing Problems with esophagus and aspiration caused empyema per MD, sips and chips only at this time

## 2012-06-24 NOTE — Progress Notes (Signed)
Pt wanted Chaplain visit to talk about her condition and wanted prayer for dr's to have answers in treating her so that she would not have another "episode". Pt was very calm and pleasant and appreciated prayer and visit. Pt would like follow-up visits.  Marjory Lies Chaplain

## 2012-06-24 NOTE — Progress Notes (Addendum)
PULMONARY  / CRITICAL CARE MEDICINE  Name: [name] MRN: [id number] DOB: 1929/02/09    LOS: 5  REFERRING PROVIDER:  [facility]  CHIEF COMPLAINT:  Dyspnea   BRIEF PATIENT DESCRIPTION: 78 yo polymyalgia rheumatica, dilated esophagus.  Admitted 12/9 with large loculated pleural effusion and mediastinal mass.  Underwent VATS decortication 12/13 and remained vented post op.   LINES / TUBES: ETT 12/13 >>> 12/14 R IJ CVL 12/13 >>> R CT 12/13 >>>  CULTURES: Respiratory 12/13>>> R pleural fluid 12/13>>>  ANTIBIOTICS: Levaquin 12/9 >>> ??? Zinacef 12/14 x 2 doses Unasyn 12/12 >>>  SIGNIFICANT EVENTS:  12/9    Admitted with large loculated pleural effusion 12/12  Thoracentesis 12/13  VATS, remains intubated 12/14  Extubated  INTERVAL HISTORY: Extubated, no distress.  VITAL SIGNS: Temp:  [97.5 F (36.4 C)-98.9 F (37.2 C)] 98 F (36.7 C) (12/14 0723) Pulse Rate:  [73-103] 81  (12/14 0900) Resp:  [11-33] 15  (12/14 0900) BP: (98-178)/(46-87) 168/56 mmHg (12/14 0900) SpO2:  [90 %-99 %] 97 % (12/14 0903) Arterial Line BP: (109-213)/(37-76) 132/43 mmHg (12/14 0800) FiO2 (%):  [40 %-50.5 %] 40 % (12/14 0903) Weight:  [170 lb 13.7 oz (77.5 kg)] 170 lb 13.7 oz (77.5 kg) (12/14 0500)  PHYSICAL EXAMINATION: General:  Chronically ill appearing, NAD  Neuro:  Drowsy, arousable, MAE, gen weakness  HEENT:  Mm moist, ETT, R IJ CVL c/d  Cardiovascular:  s1 s2 RRR no r Lungs:  Resps even non labored on vent, apneic with SBT but mechanics good when awake, few scatt ronchi, R CT x2 Abdomen: +bs, no r/g Ext: warm and dry no edema   LABS:  Lab 06/24/12 0448 06/24/12 0430 06/23/12 2059 06/23/12 1104 06/23/12 1030 06/23/12 0840 06/22/12 0433 06/20/12 0504 06/19/12 1437  HGB -- 10.7* -- 11.9* -- 12.1 12.1 12.4 --  WBC -- 13.4* -- 12.1* -- 12.9* 15.9* 13.7* --  PLT -- 168 -- 186 -- 192 -- -- --  NA -- 141 137 140 -- 139 136 -- --  K -- 3.3* 3.1* -- -- -- -- -- --  CL -- 100 95* 95* --  94* 94* -- --  CO2 -- 34* 33* 36* -- 36* 33* -- --  GLUCOSE -- 137* 153* 138* -- 146* 136* -- --  BUN -- 16 16 15  -- 15 17 -- --  CREATININE -- 0.47* 0.40* 0.43* -- 0.50 0.54 -- --  CALCIUM -- 9.1 9.3 9.6 -- 9.9 9.5 -- --  MG -- -- -- -- -- -- -- -- --  PHOS -- -- -- -- -- -- -- -- --  AST -- 15 -- 18 -- 20 -- -- 21  ALT -- 11 -- 15 -- 16 -- -- 24  ALKPHOS -- 116 -- 129* -- 130* -- -- 122*  BILITOT -- 0.5 -- 0.4 -- 0.5 -- -- 0.4  PROT -- 5.3* -- 6.2 -- 6.5 -- -- 6.9  ALBUMIN -- 2.0* -- 2.3* -- 2.4* -- -- 3.1*  INR -- -- -- 1.20 -- -- -- -- --  APTT -- -- -- 29 -- -- -- -- --  INR -- -- -- 1.20 -- -- -- -- --  LATICACIDVEN -- -- -- -- -- -- -- -- --  TROPONINI -- -- -- -- -- -- -- -- <0.30  PHART 7.499* -- -- -- 7.487* -- -- -- --  PCO2ART 47.3* -- -- -- 47.9* -- -- -- --  PO2ART 107.0* -- -- -- 83.2 -- -- -- --  HCO3 36.7* -- -- -- 35.9* -- -- -- --  O2SAT 98.0 -- -- -- 97.9 -- -- -- --   IMAGING: 12/14  CXR >>>  ETT/CVL good position, chest tube proximal side ports appear extrathoracic,   ASSESSMENT / PLAN:  PULMONARY:   A: R effusion/empyema s/p VATS.  Post op respiratory failure, resolved.  OSA - home nCPAP. P: Nasal CPAP per home regimen RN will make TCTS aware of chest tube position Xopenex scheduled  Bronchopulmonary hygiene  CARDIOLOGY:    A:  Intermittent hypertension, likely secondary to pain / agitation P: Metoprolo  GI  A: Herniated stomach.  Dilated esophagus. P: PPI BID NPO  RENAL  A: Normal renal function.  Hypokalemia. P: K repleted Trend BMP D5 1/2NS@75  D/c HCTZ  HEMATOLOGIC   A:  Stable anemia. P: SCDs for DVT Px  INFECTIOUS   A:  Suspected aspiration pneumonia. R empyema / vs parapneumonic effusion. P:  Antibiotics as above  ENDOCRINE:  A: Chronic steroids for PMR.  At risk for adrenal insufficiency.  Enlarged nodular thyroid gland. P: Hydrocortisone equivalent dose, escalate if hemodynamics of concern Thyroid  US  NEUROLOGIC A:  Post op pain  P: Fentanyl / Oxycodone PRN  WHITEHEART,KATHRYN, NP 06/24/2012  9:10 AM Pager: [phone] or [phone]  Patient examined.  Records reviewed.  Case discussed with NP.  Assessment and plan as above.  Lonia Farber, MD  Pulmonary and Critical Care Medicine [facility] Pager: [phone]

## 2012-06-24 NOTE — Procedures (Signed)
Extubation Procedure Note  Patient Details:   Name: [name] DOB: October 04, 1928 MRN: [id number]   Airway Documentation:  Airway 8 mm (Active)  Secured at (cm) 22 cm 06/19/2012 12:00 AM    Evaluation  O2 sats: stable throughout Complications: No apparent complications Patient did tolerate procedure well. Bilateral Breath Sounds: Clear;Diminished Suctioning: Airway Yes  Pt tolerated wean and was positive for cuff leak. Pt extubated per order to 5lpm [place]. Pt resting comfortably and vitals are within normal limits at this time. RT will continue to monitor.   Parke Poisson 06/24/2012, 10:39 AM

## 2012-06-24 NOTE — Progress Notes (Signed)
   CARDIOTHORACIC SURGERY PROGRESS NOTE   R1 Day Post-Op Procedure(s) (LRB): VIDEO BRONCHOSCOPY (N/A) VIDEO ASSISTED THORACOSCOPY (VATS)/DECORTICATION (Right)  Subjective: Awake and alert on vent. Denies pain.  Looks comfortable.  Objective: Vital signs: BP Readings from Last 1 Encounters:  06/24/12 172/59   Pulse Readings from Last 1 Encounters:  06/24/12 103   Resp Readings from Last 1 Encounters:  06/24/12 24   Temp Readings from Last 1 Encounters:  06/24/12 98 F (36.7 C) Oral    Hemodynamics:    Physical Exam:  Rhythm:   sinus  Breath sounds: Fairly clear  Heart sounds:  RRR  Incisions:  Dressings dry  Abdomen:  Soft, non-distended, non-tender  Extremities:  Warm, well-perfused  Chest tubes:  No air leak, low volume serosanguinous output   Intake/Output from previous day: 12/13 0701 - 12/14 0700 In: 2994.2 [I.V.:2334.2; IV Piggyback:660] Out: 995 [Urine:805; Blood:50; Chest Tube:140] Intake/Output this shift: Total I/O In: 179.5 [I.V.:79.5; IV Piggyback:100] Out: 110 [Urine:110]  Lab Results:  Basename 06/24/12 0430 06/23/12 1104  WBC 13.4* 12.1*  HGB 10.7* 11.9*  HCT 31.7* 34.8*  PLT 168 186   BMET:  Basename 06/24/12 0430 06/23/12 2059  NA 141 137  K 3.3* 3.1*  CL 100 95*  CO2 34* 33*  GLUCOSE 137* 153*  BUN 16 16  CREATININE 0.47* 0.40*  CALCIUM 9.1 9.3    CBG (last 3)   Basename 06/24/12 0722 06/24/12 0337 06/23/12 2327  GLUCAP 126* 113* 141*   ABG    Component Value Date/Time   PHART 7.499* 06/24/2012 0448   HCO3 36.7* 06/24/2012 0448   TCO2 38 06/24/2012 0448   O2SAT 98.0 06/24/2012 0448   CXR: Stable postop  Assessment/Plan: S/P Procedure(s) (LRB): VIDEO BRONCHOSCOPY (N/A) VIDEO ASSISTED THORACOSCOPY (VATS)/DECORTICATION (Right)  Overall stable POD1 Looks like she should tolerate extubation Underlying achalasia with likely chronic aspiration and severe esophageal dysmotility   Plan per Pulm/CCM team  Mobilize  after extubation  Would not advance diet beyond clear liquids until she's mobile and quite stable post extubation - and then with strict aspiration precautions  OWEN,CLARENCE H 06/24/2012 10:18 AM

## 2012-06-25 ENCOUNTER — Inpatient Hospital Stay (HOSPITAL_COMMUNITY): Payer: Medicare Other

## 2012-06-25 LAB — COMPREHENSIVE METABOLIC PANEL
Alkaline Phosphatase: 130 U/L — ABNORMAL HIGH (ref 39–117)
BUN: 12 mg/dL (ref 6–23)
GFR calc Af Amer: >90 mL/min (ref >90)
Glucose, Bld: 145 mg/dL — ABNORMAL HIGH (ref 70–99)
Potassium: 3.3 mEq/L — ABNORMAL LOW (ref 3.5–5.1)
Total Bilirubin: 0.4 mg/dL (ref 0.3–1.2)
Total Protein: 5.5 g/dL — ABNORMAL LOW (ref 6.0–8.3)

## 2012-06-25 LAB — BODY FLUID CULTURE: Culture: NO GROWTH

## 2012-06-25 LAB — CBC
HCT: 33.8 % — ABNORMAL LOW (ref 36.0–46.0)
Hemoglobin: 11.1 g/dL — ABNORMAL LOW (ref 12.0–15.0)
MCHC: 32.8 g/dL (ref 30.0–36.0)

## 2012-06-25 LAB — URINE CULTURE
Colony Count: NO GROWTH
Culture: NO GROWTH

## 2012-06-25 LAB — GLUCOSE, CAPILLARY: Glucose-Capillary: 118 mg/dL — ABNORMAL HIGH (ref 70–99)

## 2012-06-25 MED ORDER — POTASSIUM CHLORIDE 10 MEQ/50ML IV SOLN
10.0000 meq | INTRAVENOUS | Status: AC
  Administered 2012-06-25 (×3): 10 meq via INTRAVENOUS
  Filled 2012-06-25: qty 150

## 2012-06-25 NOTE — Progress Notes (Addendum)
PULMONARY  / CRITICAL CARE MEDICINE  Name: [name] MRN: [id number] DOB: [birth year]    LOS: 6  REFERRING PROVIDER:  [facility]  CHIEF COMPLAINT:  Dyspnea   BRIEF PATIENT DESCRIPTION: 78 yo polymyalgia rheumatica, dilated esophagus.  Admitted 12/9 with large loculated pleural effusion and mediastinal mass.  Underwent VATS decortication 12/13 and remained vented post op.   LINES / TUBES: ETT 12/13 >>> 12/14 R IJ CVL 12/13 >>> R CT 12/13 >>>  CULTURES: Respiratory 12/13>>> R pleural fluid 12/13>>>  ANTIBIOTICS: Levaquin 12/9 >>>off Zinacef 12/14 x 2 doses Unasyn 12/12 >>>  SIGNIFICANT EVENTS:  12/9    Admitted with large loculated pleural effusion 12/12  Thoracentesis 12/13  VATS, remains intubated 12/14  Extubated  INTERVAL HISTORY:  Extubated 12/14.  No events overnight.   VITAL SIGNS: Temp:  [98 F (36.7 C)-98.9 F (37.2 C)] 98.2 F (36.8 C) (12/15 0730) Pulse Rate:  [76-103] 88  (12/15 0700) Resp:  [15-29] 26  (12/15 0700) BP: (96-175)/(29-118) 125/49 mmHg (12/15 0700) SpO2:  [90 %-97 %] 96 % (12/15 0700) Arterial Line BP: (135-155)/(50-60) 153/54 mmHg (12/14 1200) FiO2 (%):  [40 %-40.2 %] 40.2 % (12/14 1000) Weight:  [166 lb 7.2 oz (75.5 kg)] 166 lb 7.2 oz (75.5 kg) (12/15 0600)  PHYSICAL EXAMINATION: General:  Chronically ill appearing female, NAD OOB in chair  Neuro: awake, alert, appropriate  HEENT:  Mm moist, R IJ CVL c/d  Cardiovascular:  s1 s2 RRR no r Lungs:  Resps even non labored on vent, apneic with SBT but mechanics good when awake, few scatt ronchi, R CT x2 Abdomen: +bs, no r/g Ext: warm and dry no edema   LABS:  Lab 06/25/12 0401 06/24/12 0448 06/24/12 0430 06/23/12 2059 06/23/12 1104 06/23/12 1030 06/23/12 0840 06/22/12 0433 06/19/12 1437  HGB 11.1* -- 10.7* -- 11.9* -- 12.1 12.1 --  WBC 12.3* -- 13.4* -- 12.1* -- 12.9* 15.9* --  PLT 204 -- 168 -- 186 -- -- -- --  NA 144 -- 141 137 140 -- 139 -- --  K 3.3* -- 3.3* -- -- -- -- -- --  CL  103 -- 100 95* 95* -- 94* -- --  CO2 36* -- 34* 33* 36* -- 36* -- --  GLUCOSE 145* -- 137* 153* 138* -- 146* -- --  BUN 12 -- 16 16 15  -- 15 -- --  CREATININE 0.45* -- 0.47* 0.40* 0.43* -- 0.50 -- --  CALCIUM 9.1 -- 9.1 9.3 9.6 -- 9.9 -- --  MG -- -- -- -- -- -- -- -- --  PHOS -- -- -- -- -- -- -- -- --  AST 16 -- 15 -- 18 -- 20 -- 21  ALT 12 -- 11 -- 15 -- 16 -- 24  ALKPHOS 130* -- 116 -- 129* -- 130* -- 122*  BILITOT 0.4 -- 0.5 -- 0.4 -- 0.5 -- 0.4  PROT 5.5* -- 5.3* -- 6.2 -- 6.5 -- 6.9  ALBUMIN 2.0* -- 2.0* -- 2.3* -- 2.4* -- 3.1*  INR -- -- -- -- 1.20 -- -- -- --  APTT -- -- -- -- 29 -- -- -- --  INR -- -- -- -- 1.20 -- -- -- --  LATICACIDVEN -- -- -- -- -- -- -- -- --  TROPONINI -- -- -- -- -- -- -- -- <0.30  PHART -- 7.499* -- -- -- 7.487* -- -- --  PCO2ART -- 47.3* -- -- -- 47.9* -- -- --  PO2ART -- 107.0* -- -- --  83.2 -- -- --  HCO3 -- 36.7* -- -- -- 35.9* -- -- --  O2SAT -- 98.0 -- -- -- 97.9 -- -- --   IMAGING: 12/15 CXR>>>  Volume loss in the right hemithorax with pleural fluid and two chest tubes. No pneumothorax. Some improvement in aeration to the left lung base.  ASSESSMENT / PLAN:  PULMONARY:   A: R effusion/empyema s/p VATS.  Post op respiratory failure, resolved.  OSA - home nCPAP. P: Nasal CPAP qhs per home regimen Cont Xopenex scheduled  Bronchopulmonary hygiene O2 - wean as able  CT per CVTS   CARDIOLOGY:    A:  Intermittent hypertension, likely secondary to pain / agitation - improved.  P: Cont metoprolol, PRN labetolol   GI  A: Herniated stomach.  Dilated esophagus. P: PPI BID Clear liquid diet Need reconsult GI in AM (seen by Arlyce Dice previously) NG vs PEG vs endoscopic intervention vs surgery  RENAL  A: Normal renal function.  Hypokalemia. P: Replete K PRN  Trend BMP D5 1/2NS@75   HEMATOLOGIC   A:  Stable anemia. P: SCDs for DVT Px  INFECTIOUS   A:  Suspected aspiration pneumonia. R empyema / vs parapneumonic effusion. P:   Antibiotics as above  ENDOCRINE:  A: Chronic steroids for PMR.  At risk for adrenal insufficiency.  Enlarged nodular thyroid gland. P: Hydrocortisone equivalent dose, escalate if hemodynamics of concern Thyroid US pending   NEUROLOGIC A:  Post op pain  P: Fentanyl / Oxycodone PRN  WHITEHEART,KATHRYN, NP 06/25/2012  8:21 AM Pager: [phone] or [phone]  Patient examined.  Records reviewed.  Case discussed with NP.  Assessment and plan as above.  Lonia Farber, MD  Pulmonary and Critical Care Medicine [facility] Pager: [phone]

## 2012-06-25 NOTE — Progress Notes (Signed)
   CARDIOTHORACIC SURGERY PROGRESS NOTE  2 Days Post-Op  S/P Procedure(s) (LRB): VIDEO BRONCHOSCOPY (N/A) VIDEO ASSISTED THORACOSCOPY (VATS)/DECORTICATION (Right)  Subjective: No specific complaints  Objective: Vital signs in last 24 hours: Temp:  [98 F (36.7 C)-98.9 F (37.2 C)] 98.2 F (36.8 C) (12/15 0730) Pulse Rate:  [76-100] 98  (12/15 1000) Cardiac Rhythm:  [-] Normal sinus rhythm (12/15 1000) Resp:  [17-29] 24  (12/15 1000) BP: (96-175)/(29-118) 133/59 mmHg (12/15 1000) SpO2:  [90 %-97 %] 96 % (12/15 1000) Arterial Line BP: (135-153)/(54-60) 153/54 mmHg (12/14 1200) Weight:  [75.5 kg (166 lb 7.2 oz)] 75.5 kg (166 lb 7.2 oz) (12/15 0600)  Physical Exam:  Rhythm:   sinus  Breath sounds: Few rhonchi  Heart sounds:  RRR  Incisions:  Dressings clean and dry  Abdomen:  soft  Extremities:  warm  Chest tube(s):  Low volume serosanguinous output, no air leak   Intake/Output from previous day: 12/14 0701 - 12/15 0700 In: 1964.5 [I.V.:1354.5; IV Piggyback:610] Out: 1385 [Urine:1355; Chest Tube:30] Intake/Output this shift: Total I/O In: 335 [I.V.:225; IV Piggyback:110] Out: 185 [Urine:185]  Lab Results:  [facility] 06/25/12 0401 06/24/12 0430  WBC 12.3* 13.4*  HGB 11.1* 10.7*  HCT 33.8* 31.7*  PLT 204 168   BMET:  Basename 06/25/12 0401 06/24/12 0430  NA 144 141  K 3.3* 3.3*  CL 103 100  CO2 36* 34*  GLUCOSE 145* 137*  BUN 12 16  CREATININE 0.45* 0.47*  CALCIUM 9.1 9.1    CBG (last 3)   Basename 06/25/12 0728 06/25/12 0039 06/24/12 2026  GLUCAP 118* 128* 111*    CXR:  *RADIOLOGY REPORT*  Clinical Data: Postop VATS  PORTABLE CHEST - 1 VIEW  Comparison: Chest radiograph 06/14/2012  Findings: Interval extubation. There are two right chest tubes in  place. There is volume loss in the right hemithorax with no  evidence of pneumothorax. There is pleural fluid in the right lung  base. Left lung is clear with a small effusion. There is improved    aeration the left lung base . Normal heart silhouette.  IMPRESSION:  1. Volume loss in the right hemithorax with pleural fluid and two  chest tubes. No pneumothorax.  2. Some improvement in aeration to the left lung base.  3. Extubation.  Original Report Authenticated By: Genevive Bi, M.D.   Assessment/Plan: S/P Procedure(s) (LRB): VIDEO BRONCHOSCOPY (N/A) VIDEO ASSISTED THORACOSCOPY (VATS)/DECORTICATION (Right)  Stable POD2 Advance chest tubes and retape to chest wall Chest tubes to water seal Clear liquids diet with aspiration precautions Probably could transfer to 3300 step down   [name],CLARENCE H 06/25/2012 10:25 AM

## 2012-06-26 ENCOUNTER — Encounter (HOSPITAL_COMMUNITY): Payer: Self-pay | Admitting: Cardiothoracic Surgery

## 2012-06-26 ENCOUNTER — Inpatient Hospital Stay (HOSPITAL_COMMUNITY): Payer: Medicare Other

## 2012-06-26 DIAGNOSIS — E042 Nontoxic multinodular goiter: Secondary | ICD-10-CM

## 2012-06-26 HISTORY — DX: Nontoxic multinodular goiter: E04.2

## 2012-06-26 LAB — BASIC METABOLIC PANEL
BUN: 13 mg/dL (ref 6–23)
Calcium: 9 mg/dL (ref 8.4–10.5)
Creatinine, Ser: 0.42 mg/dL — ABNORMAL LOW (ref 0.50–1.10)
GFR calc Af Amer: >90 mL/min (ref >90)
GFR calc non Af Amer: >90 mL/min (ref >90)
Glucose, Bld: 109 mg/dL — ABNORMAL HIGH (ref 70–99)

## 2012-06-26 LAB — CULTURE, RESPIRATORY W GRAM STAIN

## 2012-06-26 LAB — CBC
HCT: 31.2 % — ABNORMAL LOW (ref 36.0–46.0)
Hemoglobin: 10.1 g/dL — ABNORMAL LOW (ref 12.0–15.0)
MCH: 31.5 pg (ref 26.0–34.0)
MCHC: 32.4 g/dL (ref 30.0–36.0)
MCV: 97.2 fL (ref 78.0–100.0)
RDW: 14.4 % (ref 11.5–15.5)

## 2012-06-26 LAB — GLUCOSE, CAPILLARY
Glucose-Capillary: 104 mg/dL — ABNORMAL HIGH (ref 70–99)
Glucose-Capillary: 107 mg/dL — ABNORMAL HIGH (ref 70–99)
Glucose-Capillary: 132 mg/dL — ABNORMAL HIGH (ref 70–99)
Glucose-Capillary: 99 mg/dL (ref 70–99)

## 2012-06-26 LAB — PATHOLOGIST SMEAR REVIEW

## 2012-06-26 MED ORDER — METOPROLOL TARTRATE 12.5 MG HALF TABLET
12.5000 mg | ORAL_TABLET | Freq: Every day | ORAL | Status: DC
  Administered 2012-06-27 – 2012-06-28 (×2): 12.5 mg via ORAL
  Filled 2012-06-26 (×2): qty 1

## 2012-06-26 MED ORDER — PANTOPRAZOLE SODIUM 40 MG PO TBEC
40.0000 mg | DELAYED_RELEASE_TABLET | Freq: Every day | ORAL | Status: DC
  Administered 2012-06-27 – 2012-06-28 (×2): 40 mg via ORAL
  Filled 2012-06-26 (×2): qty 1

## 2012-06-26 MED ORDER — PREDNISONE 5 MG PO TABS
5.0000 mg | ORAL_TABLET | Freq: Every day | ORAL | Status: DC
  Administered 2012-06-27 – 2012-06-28 (×2): 5 mg via ORAL
  Filled 2012-06-26 (×3): qty 1

## 2012-06-26 MED ORDER — SODIUM CHLORIDE 0.9 % IV SOLN
INTRAVENOUS | Status: DC
  Administered 2012-06-26: 20:00:00 via INTRAVENOUS

## 2012-06-26 MED ORDER — ENOXAPARIN SODIUM 30 MG/0.3ML ~~LOC~~ SOLN
30.0000 mg | SUBCUTANEOUS | Status: DC
  Administered 2012-06-28: 30 mg via SUBCUTANEOUS
  Filled 2012-06-26 (×3): qty 0.3

## 2012-06-26 NOTE — Evaluation (Signed)
Physical Therapy Evaluation Patient Details Name: [name] MRN: [id number] DOB: January 15, 1929 Today's Date: 06/26/2012 Time: 4098-1191 PT Time Calculation (min): 24 min  PT Assessment / Plan / Recommendation Clinical Impression  pt s/p VATS for drainage of complex pleural effusion/empyema.  Pt can benefit from PT to maximize functional independence.    PT Assessment  Patient needs continued PT services    Follow Up Recommendations  SNF    Does the patient have the potential to tolerate intense rehabilitation      Barriers to Discharge Decreased caregiver support      Equipment Recommendations   (TBD post acute)    Recommendations for Other Services     Frequency Min 3X/week    Precautions / Restrictions Restrictions Weight Bearing Restrictions: No   Pertinent Vitals/Pain EHR 100's, sats with 3L [place] and ambulation down to 89%, back up to 91% with efficient breathing.      Mobility  Bed Mobility Bed Mobility: Supine to Sit;Sitting - Scoot to Edge of Bed Supine to Sit: 5: Supervision Sitting - Scoot to [place] of Bed: 5: Supervision Transfers Transfers: Sit to Stand;Stand to Sit Sit to Stand: 5: Supervision Stand to Sit: 5: Supervision Details for Transfer Assistance: safe technique Ambulation/Gait Ambulation/Gait Assistance: 4: Min guard Ambulation Distance (Feet): 200 Feet Assistive device: Other (Comment) (pushing IV pole) Ambulation/Gait Assistance Details: small limp R from leg length discrep., but generally steady. Gait Pattern: Step-through pattern;Lateral trunk lean to right    Shoulder Instructions     Exercises     PT Diagnosis: Other (comment);Difficulty walking (decr activity tolerance)  PT Problem List: Decreased activity tolerance;Decreased mobility;Cardiopulmonary status limiting activity;Decreased knowledge of use of DME PT Treatment Interventions: Gait training;Therapeutic activities;Functional mobility training;Patient/family education;DME  instruction   PT Goals Acute Rehab PT Goals PT Goal Formulation: With patient Time For Goal Achievement: 06/26/12 Potential to Achieve Goals: Good Pt will go Supine/Side to Sit: with modified independence PT Goal: Supine/Side to Sit - Progress: Goal set today Pt will go Sit to Stand: with modified independence PT Goal: Sit to Stand - Progress: Goal set today Pt will Transfer Bed to Chair/Chair to Bed: with modified independence PT Transfer Goal: Bed to Chair/Chair to Bed - Progress: Goal set today Pt will Ambulate: with supervision;>150 feet;with least restrictive assistive device PT Goal: Ambulate - Progress: Goal set today  Visit Information  Last PT Received On: 06/26/12 Assistance Needed: +1    Subjective Data  Subjective: Feels pretty good   Prior Functioning  Home Living Lives With: Spouse Available Help at Discharge: Family Type of Home: Independent living facility Home Access: Level entry Home Layout: One level Bathroom Shower/Tub: Tub/shower unit;Walk-in shower Bathroom Toilet: Handicapped height Home Adaptive Equipment: Straight cane Prior Function Level of Independence: Independent Able to Take Stairs?: Yes Driving: Yes Communication Communication: No difficulties    Cognition  Overall Cognitive Status: Appears within functional limits for tasks assessed/performed Arousal/Alertness: Awake/alert Orientation Level: Appears intact for tasks assessed Behavior During Session: [facility] for tasks performed    Extremity/Trunk Assessment Right Lower Extremity Assessment RLE ROM/Strength/Tone: Within functional levels Left Lower Extremity Assessment LLE ROM/Strength/Tone: Within functional levels Trunk Assessment Trunk Assessment: Normal   Balance Balance Balance Assessed: Yes Static Sitting Balance Static Sitting - Balance Support: Feet supported;No upper extremity supported Static Sitting - Level of Assistance: 5: Stand by assistance  End of Session PT - End of  Session Equipment Utilized During Treatment: Oxygen Activity Tolerance: Patient tolerated treatment well Patient left: in chair;with call  bell/phone within reach;with family/visitor present Nurse Communication: Mobility status  GP     Tjay Velazquez, Eliseo Gum 06/26/2012, 5:39 PM  06/26/2012  [place], PT [phone] (pager)

## 2012-06-26 NOTE — Progress Notes (Signed)
[facility] Daily Rounding Note 06/26/2012, 8:53 AM  SUBJECTIVE:       Dr Tyrone Sage says pt is doing well post VATS.   Tolerating clears without choking or gagging.   OBJECTIVE:         Vital signs in last 24 hours:    Temp:  [97.5 F (36.4 C)-99 F (37.2 C)] 99 F (37.2 C) (12/16 0739) Pulse Rate:  [73-102] 87  (12/16 0800) Resp:  [15-29] 22  (12/16 0800) BP: (101-174)/(39-75) 127/57 mmHg (12/16 0800) SpO2:  [89 %-99 %] 96 % (12/16 0838) Weight:  [75.9 kg (167 lb 5.3 oz)] 75.9 kg (167 lb 5.3 oz) (12/16 0600) Last BM Date: 06/23/12 General: frail, elderly WF.  Not toxic appearing.    Heart: RRR.  No MRG Chest: clear in front.  Decreased BS on right lower lobe.  No resp distress.  Abdomen: soft, NT, ND.  No HSM or mass.   Extremities: no pedal edema.  Neuro/Psych:  Pleasant, oriented x 3.    Intake/Output from previous day: 12/15 0701 - 12/16 0700 In: 1377 [P.O.:240; I.V.:667; IV Piggyback:470] Out: 1410 [Urine:1410]  Intake/Output this shift: Total I/O In: 20 [I.V.:20] Out: -   Lab Results:  Basename 06/26/12 0435 06/25/12 0401 06/24/12 0430  WBC 10.1 12.3* 13.4*  HGB 10.1* 11.1* 10.7*  HCT 31.2* 33.8* 31.7*  PLT 209 204 168   BMET  Basename 06/26/12 0435 06/25/12 0401 06/24/12 0430  NA 141 144 141  K 3.7 3.3* 3.3*  CL 101 103 100  CO2 34* 36* 34*  GLUCOSE 109* 145* 137*  BUN 13 12 16   CREATININE 0.42* 0.45* 0.47*  CALCIUM 9.0 9.1 9.1   LFT  Basename 06/25/12 0401 06/24/12 0430 06/23/12 1104  PROT 5.5* 5.3* 6.2  ALBUMIN 2.0* 2.0* 2.3*  AST 16 15 18   ALT 12 11 15   ALKPHOS 130* 116 129*  BILITOT 0.4 0.5 0.4  BILIDIR -- -- --  IBILI -- -- --   PT/INR  Basename 06/23/12 1104  LABPROT 15.0  INR 1.20   Hepatitis Panel No results found for this basename: HEPBSAG,HCVAB,HEPAIGM,HEPBIGM in the last 72 hours  Studies/Results: US Soft Tissue Head/neck 06/25/2012    IMPRESSION: Multi nodular goiter.  Large nodules in both lobes of the gland. These  nodules fit national criteria for fine needle aspiration biopsy if not previously assessed.   Original Report Authenticated By: Francene Boyers, M.D.    Dg Chest Port 1 View 06/26/2012    IMPRESSION: Stable.  Right chest tubes remain in place without pneumothorax. Stable bilateral pleural effusions right greater than left.   Original Report Authenticated By: Jolaine Click, M.D.    Dg Chest Portable 1 View 06/25/2012    IMPRESSION:  1.  Volume loss in the right hemithorax with pleural fluid and two chest tubes.  No pneumothorax. 2.  Some improvement in aeration to the left lung base. 3.  Extubation.   Original Report Authenticated By: Genevive Bi, M.D.     ASSESMENT: *  S/P right VATS, drainage, decortication following failed percutaneous thoracentesis.  Aspiration  PNA with pleural effusion.  On Unasyn. Chest tubes to be removed.  *  Severe dysphagia, esoph dysmotility, megaesophagus, suspected achalsia.  Did not get EGD reports from [facility] other  Than the last ofFice visit.  *  Multinodular goiter.  No TSH pending or resulted.  *  PMR.  Prednisone dependent.  Continues on stress dosing Hydrocortisone IV.    PLAN:  *  Removal of right chest tube today *  EGD with possible esophageal dilatation 11AM 06/27/12.  No Botox injection planned until we get esoph manometry studies completed, which are done once weekly on Mondays at [facility].  *  Hold Lovenox pre EGD. *  TSH in AM   *  Change to PO Protonix.  *  When can she resume po Prednisone?    LOS: 7 days   Jennye Moccasin  06/26/2012, 8:53 AM Pager: [phone]    I have taken an interval history, reviewed the chart and examined the patient. I agree with SG's note, impression and recommendations. Although overall picture is c/w achalasia we need to confirm that there are no other disorders, such as and EGJ lesion, with EGD tomorrow and then esophageal manometry before any achalasia therapy.  Venita Lick. Russella Dar MD Clementeen Graham

## 2012-06-26 NOTE — Op Note (Signed)
[name], [name] NO.:  [id number]  MEDICAL RECORD NO.:  [id number]  LOCATION:  2306                         FACILITY:  [facility]  PHYSICIAN:  Sheliah Plane, MD    DATE OF BIRTH:  [birth year]  DATE OF PROCEDURE: DATE OF DISCHARGE:  06/22/2012                              OPERATIVE REPORT   PREOPERATIVE DIAGNOSIS:  Effusion after presentation with right lung pneumonia.  POSTOPERATIVE DIAGNOSIS:  Effusion after presentation with right lung pneumonia.  SURGICAL PROCEDURE:  Bronchoscopy, right video-assisted thoracoscopy with drainage of complex pleural effusion and decortication.  SURGEON:  Sheliah Plane, MD  FIRST ASSISTANT:  Pauline Good, PA  BRIEF HISTORY:  The patient is a 78 year old female, who [facility] to [facility] on [facility]th with a sudden onset of right pleuritic pain.  CT scan and serial chest x-rays and thoracentesis was performed at [facility] but without any improvement and with progressive enlarging right pleural effusion suggestive of complex loculated effusion by ultrasound.  Thoracentesis was unsuccessful, draining only 15 mL of fluid.  The patient also was found to have severe esophageal disease with a sigmoid esophagus consistent with end-stage achalasia.  Because of progressive nature and the complex pleural effusion, a video-assisted thoracoscopy and drainage was recommended. The patient agreed and signed informed consent.  DESCRIPTION OF PROCEDURE:  The patient underwent general endotracheal anesthesia without incident.  The single-lumen endotracheal tube was placed.  Through this, a fiberoptic bronchoscope was passed.  There were no obvious endobronchial lesions to the subsegmental level noted. Bronchial washings were sent for culture and Gram stain.  The scope was removed.  The double-lumen endotracheal tube was then placed.  The patient was turned in lateral decubitus position with the right side up. A small  port incision was made in the midaxillary line approximately at the 7th intercostal space.  Through this space, several areas of loculated pleural effusion with septae developing were countered.  The initial of these were broken up with the scope and several 100 mL of pleural fluid was evacuated.  Specimens for chemistry, cell count, and cultures and cytology were all collected.  Two additional port sites, 1 more superior and anterior and 1 more posterior was also created through these 3 ports, the remaining areas of loculated effusion were broken up using 3 port sites and with assistance of video scope, pleural debris was removed from the pleural space and peeled from the lung.  With the fluid drained, the loculations removed and the lung expanded after 2 chest tubes had been left in place, the 3rd each through 1 of the port sites.  The more posterior port site was closed with interrupted absorbable suture and the deep muscle 3-0 subcuticular stitch in skin edges.  Dry dressings were applied.  Sponge and needle count was reported as correct at completion of the procedure.  Because of the patient's known dilated esophagus and presumably the origin of her pulmonary problems with aspiration, she was immediately extubated and single lumen endotracheal tube was replaced and she was returned to the surgical intensive care unit on ventilator support. Sponge and needle count was reported as correct at completion of the procedure.  The patient tolerated the procedure  without obvious complication.  His blood loss was minimal.     Sheliah Plane, MD     EG/MEDQ  D:  06/25/2012  T:  06/26/2012  Job:  [id number]

## 2012-06-26 NOTE — Progress Notes (Signed)
Patient ID: [name], female   DOB: [birth year], 78 y.o.   MRN: [id number] [facility] DAILY PROGRESS NOTE                   [address]            [place] [address]          [phone]      3 Days Post-Op Procedure(s) (LRB): VIDEO BRONCHOSCOPY (N/A) VIDEO ASSISTED THORACOSCOPY (VATS)/DECORTICATION (Right)  Total Length of Stay:  LOS: 7 days   Subjective: Taking clear liquids ok   Objective: Vital signs in last 24 hours: Temp:  [97.5 F (36.4 C)-99 F (37.2 C)] 99 F (37.2 C) (12/16 0739) Pulse Rate:  [73-102] 87  (12/16 0800) Cardiac Rhythm:  [-] Heart block;Other (Comment) (12/16 0400) Resp:  [15-29] 22  (12/16 0800) BP: (101-174)/(39-75) 127/57 mmHg (12/16 0800) SpO2:  [89 %-99 %] 98 % (12/16 0800) Weight:  [167 lb 5.3 oz (75.9 kg)] 167 lb 5.3 oz (75.9 kg) (12/16 0600)  Filed Weights   06/24/12 0500 06/25/12 0600 06/26/12 0600  Weight: 170 lb 13.7 oz (77.5 kg) 166 lb 7.2 oz (75.5 kg) 167 lb 5.3 oz (75.9 kg)    Weight change: 14.1 oz (0.4 kg)   Hemodynamic parameters for last 24 hours:    Intake/Output from previous day: 12/15 0701 - 12/16 0700 In: 1377 [P.O.:240; I.V.:667; IV Piggyback:470] Out: 1410 [Urine:1410]  Intake/Output this shift: Total I/O In: 20 [I.V.:20] Out: -   Current Meds: Scheduled Meds:   . ampicillin-sulbactam (UNASYN) IV  1.5 g Intravenous Q6H  . bisacodyl  10 mg Oral Daily  . docusate sodium  100 mg Oral BID  . hydrocortisone sodium succinate  25 mg Intravenous Daily  . insulin aspart  0-9 Units Subcutaneous Q4H  . levalbuterol  0.63 mg Nebulization Q6H  . metoprolol tartrate  25 mg Oral Daily  . pantoprazole (PROTONIX) IV  40 mg Intravenous Q12H   Continuous Infusions:   . dextrose 5 % and 0.45% NaCl 20 mL/hr at 06/25/12 1400   PRN Meds:.acetaminophen, acetaminophen, albuterol, labetalol, ondansetron (ZOFRAN) IV, ondansetron, oxyCODONE-acetaminophen, potassium chloride  General appearance: alert and  cooperative Neurologic: intact Heart: regular rate and rhythm, S1, S2 normal, no murmur, click, rub or gallop and normal apical impulse Lungs: diminished breath sounds RLL Abdomen: soft, non-tender; bowel sounds normal; no masses,  no organomegaly Extremities: extremities normal, atraumatic, no cyanosis or edema and Homans sign is negative, no sign of DVT Wound: no air leak from ct  Lab Results: CBC: Basename 06/26/12 0435 06/25/12 0401  WBC 10.1 12.3*  HGB 10.1* 11.1*  HCT 31.2* 33.8*  PLT 209 204   BMET:  Basename 06/26/12 0435 06/25/12 0401  NA 141 144  K 3.7 3.3*  CL 101 103  CO2 34* 36*  GLUCOSE 109* 145*  BUN 13 12  CREATININE 0.42* 0.45*  CALCIUM 9.0 9.1    PT/INR:  Basename 06/23/12 1104  LABPROT 15.0  INR 1.20   Radiology: US Soft Tissue Head/neck  06/25/2012  *RADIOLOGY REPORT*  Clinical Data: Thyroid nodule.  THYROID ULTRASOUND  Technique: Ultrasound examination of the thyroid gland and adjacent soft tissues was performed.  Comparison:  None.  Findings:  Right thyroid lobe:  5.4 x 3.1 x 3.6 cm. Left thyroid lobe:  6.9 x 3.2 x 3.1 cm. Isthmus:  0.8 cm.  Focal nodules:  There are numerous nodules throughout both glands. The dominant nodule in the superior aspect  of the right lobe measures 3.1 x 2.3 x 2.4 cm.  This is a complex nodule which is primarily solid.  There is a 2.9 x 3.2 x 3.2 cm complex primarily solid nodule in the left lower pole.  There are multiple other nodules throughout the gland.  There is hypervascularity of both lobes.  Lymphadenopathy:  None visualized.  IMPRESSION: Multi nodular goiter.  Large nodules in both lobes of the gland. These nodules fit national criteria for fine needle aspiration biopsy if not previously assessed.   Original Report Authenticated By: Francene Boyers, M.D.    Dg Chest Port 1 View  06/26/2012  *RADIOLOGY REPORT*  Clinical Data: Pleural effusion  PORTABLE CHEST - 1 VIEW  Comparison: Yesterday  Findings: Stable right internal  jugular vein central venous catheter and right chest tubes.  No pneumothorax.  Moderate right pleural effusion and volume loss stable.  Small left pleural effusion and volume loss unchanged.  Dual lead left subclavian pacemaker device and leads are unchanged.  IMPRESSION: Stable.  Right chest tubes remain in place without pneumothorax. Stable bilateral pleural effusions right greater than left.   Original Report Authenticated By: Jolaine Click, M.D.    Dg Chest Portable 1 View  06/25/2012  *RADIOLOGY REPORT*  Clinical Data: Postop VATS  PORTABLE CHEST - 1 VIEW  Comparison: Chest radiograph 06/14/2012  Findings: Interval extubation. There are two right chest tubes in place.  There is volume loss in the right hemithorax with no evidence of pneumothorax.  There is pleural fluid in the right lung base.  Left lung is clear with a small effusion.  There is improved aeration the left lung base .  Normal heart silhouette.  IMPRESSION:  1.  Volume loss in the right hemithorax with pleural fluid and two chest tubes.  No pneumothorax. 2.  Some improvement in aeration to the left lung base. 3.  Extubation.   Original Report Authenticated By: Genevive Bi, M.D.      Assessment/Plan: S/P Procedure(s) (LRB): VIDEO BRONCHOSCOPY (N/A) VIDEO ASSISTED THORACOSCOPY (VATS)/DECORTICATION (Right) Mobilize Diuresis d/c tubes/lines Multi nodular goiter. Large nodules in both lobes of the gland.  Will need fu with GI today   Mychele Seyller B 06/26/2012 8:39 AM

## 2012-06-26 NOTE — Progress Notes (Signed)
Patient placed herself on her CPAP from home [facility].  Patient tolerating CPAP well.

## 2012-06-26 NOTE — Progress Notes (Addendum)
PULMONARY  / CRITICAL CARE MEDICINE  Name: MELLA INCLAN MRN: [id number] DOB: [birth year]    LOS: 7  REFERRING PROVIDER:  TRH  CHIEF COMPLAINT:  Dyspnea   BRIEF PATIENT DESCRIPTION: 78 yo polymyalgia rheumatica, dilated esophagus.  Admitted 12/9 with large loculated pleural effusion and mediastinal mass.  Underwent VATS decortication 12/13 and remained vented post op.   LINES / TUBES: ETT 12/13 >>> 12/14 R IJ CVL 12/13 >>> R CT 12/13 >>>12/16  CULTURES: Respiratory 12/13>>>ngtd>>> R pleural fluid 12/13>>>ngtd>>>  ANTIBIOTICS: Levaquin 12/9 >>>off Zinacef 12/14 x 2 doses Unasyn 12/12 >>>  SIGNIFICANT EVENTS/Studies:  12/9    Admitted with large loculated pleural effusion 12/12  Thoracentesis 12/13  VATS, remains intubated 12/14  Extubated 12/16 Thyroid ultrasound>>> Multi nodular goiter. Large nodules in both lobes of the gland. These nodules fit national criteria for fine needle aspiration biopsy if not previously assessed.   INTERVAL HISTORY:  Chest tubes out this am.  Feeling much better.   VITAL SIGNS: Temp:  [97.5 F (36.4 C)-99 F (37.2 C)] 99 F (37.2 C) (12/16 0739) Pulse Rate:  [73-102] 77  (12/16 1100) Resp:  [15-29] 18  (12/16 1100) BP: (101-174)/(39-65) 107/51 mmHg (12/16 1100) SpO2:  [89 %-99 %] 96 % (12/16 1100) Weight:  [167 lb 5.3 oz (75.9 kg)] 167 lb 5.3 oz (75.9 kg) (12/16 0600)  PHYSICAL EXAMINATION: General:  Chronically ill appearing female, NAD OOB in chair  Neuro: awake, alert, appropriate  HEENT:  Mm moist, R IJ CVL c/d  Cardiovascular:  s1 s2 RRR no r Lungs:  Resps even non labored on Martin, few scattered ronchi, CT out Abdomen: +bs, no r/g Ext: warm and dry no edema   LABS:  Lab 06/26/12 0435 06/25/12 0401 06/24/12 0448 06/24/12 0430 06/23/12 2059 06/23/12 1104 06/23/12 1030 06/23/12 0840 06/19/12 1437  HGB 10.1* 11.1* -- 10.7* -- 11.9* -- 12.1 --  WBC 10.1 12.3* -- 13.4* -- 12.1* -- 12.9* --  PLT 209 204 -- 168 -- -- -- -- --   NA 141 144 -- 141 137 140 -- -- --  K 3.7 3.3* -- -- -- -- -- -- --  CL 101 103 -- 100 95* 95* -- -- --  CO2 34* 36* -- 34* 33* 36* -- -- --  GLUCOSE 109* 145* -- 137* 153* 138* -- -- --  BUN 13 12 -- 16 16 15  -- -- --  CREATININE 0.42* 0.45* -- 0.47* 0.40* 0.43* -- -- --  CALCIUM 9.0 9.1 -- 9.1 9.3 9.6 -- -- --  MG -- -- -- -- -- -- -- -- --  PHOS -- -- -- -- -- -- -- -- --  AST -- 16 -- 15 -- 18 -- 20 21  ALT -- 12 -- 11 -- 15 -- 16 24  ALKPHOS -- 130* -- 116 -- 129* -- 130* 122*  BILITOT -- 0.4 -- 0.5 -- 0.4 -- 0.5 0.4  PROT -- 5.5* -- 5.3* -- 6.2 -- 6.5 6.9  ALBUMIN -- 2.0* -- 2.0* -- 2.3* -- 2.4* 3.1*  INR -- -- -- -- -- 1.20 -- -- --  APTT -- -- -- -- -- 29 -- -- --  INR -- -- -- -- -- 1.20 -- -- --  LATICACIDVEN -- -- -- -- -- -- -- -- --  TROPONINI -- -- -- -- -- -- -- -- <0.30  PHART -- -- 7.499* -- -- -- 7.487* -- --  PCO2ART -- -- 47.3* -- -- -- 47.9* -- --  PO2ART -- -- 107.0* -- -- -- 83.2 -- --  HCO3 -- -- 36.7* -- -- -- 35.9* -- --  O2SAT -- -- 98.0 -- -- -- 97.9 -- --   IMAGING: US Soft Tissue Head/neck  06/25/2012  *RADIOLOGY REPORT*  Clinical Data: Thyroid nodule.  THYROID ULTRASOUND  Technique: Ultrasound examination of the thyroid gland and adjacent soft tissues was performed.  Comparison:  None.  Findings:  Right thyroid lobe:  5.4 x 3.1 x 3.6 cm. Left thyroid lobe:  6.9 x 3.2 x 3.1 cm. Isthmus:  0.8 cm.  Focal nodules:  There are numerous nodules throughout both glands. The dominant nodule in the superior aspect of the right lobe measures 3.1 x 2.3 x 2.4 cm.  This is a complex nodule which is primarily solid.  There is a 2.9 x 3.2 x 3.2 cm complex primarily solid nodule in the left lower pole.  There are multiple other nodules throughout the gland.  There is hypervascularity of both lobes.  Lymphadenopathy:  None visualized.  IMPRESSION: Multi nodular goiter.  Large nodules in both lobes of the gland. These nodules fit national criteria for fine needle aspiration  biopsy if not previously assessed.   Original Report Authenticated By: Francene Boyers, M.D.    Dg Chest Port 1 View  06/26/2012  *RADIOLOGY REPORT*  Clinical Data: Pleural effusion  PORTABLE CHEST - 1 VIEW  Comparison: Yesterday  Findings: Stable right internal jugular vein central venous catheter and right chest tubes.  No pneumothorax.  Moderate right pleural effusion and volume loss stable.  Small left pleural effusion and volume loss unchanged.  Dual lead left subclavian pacemaker device and leads are unchanged.  IMPRESSION: Stable.  Right chest tubes remain in place without pneumothorax. Stable bilateral pleural effusions right greater than left.   Original Report Authenticated By: Jolaine Click, M.D.    Dg Chest Portable 1 View  06/25/2012  *RADIOLOGY REPORT*  Clinical Data: Postop VATS  PORTABLE CHEST - 1 VIEW  Comparison: Chest radiograph 06/14/2012  Findings: Interval extubation. There are two right chest tubes in place.  There is volume loss in the right hemithorax with no evidence of pneumothorax.  There is pleural fluid in the right lung base.  Left lung is clear with a small effusion.  There is improved aeration the left lung base .  Normal heart silhouette.  IMPRESSION:  1.  Volume loss in the right hemithorax with pleural fluid and two chest tubes.  No pneumothorax. 2.  Some improvement in aeration to the left lung base. 3.  Extubation.   Original Report Authenticated By: Genevive Bi, M.D.      ASSESSMENT / PLAN:  PULMONARY:   A: R effusion/empyema s/p VATS.  Post op respiratory failure, resolved.  OSA - home CPAP. CT d/c 12/16.  P: Nasal CPAP qhs per home regimen Cont Xopenex scheduled  Bronchopulmonary hygiene O2 - wean as able  Chest tubes per cvts, sent hole in chest? = removed 12/16 Ambulate once off O2 and check sats  CARDIOLOGY:    A:  Intermittent hypertension, likely secondary to pain / agitation - improved.  P: Decrease metoprolol to 12.5 BID  with borderline  BP  (home dose 25 BID)  GI  A: Herniated stomach.  Dilated esophagus. P: PPI BID Clear liquid diet GI following - EGD planned 12/17 with possible dilation  RENAL  A: Normal renal function.  Hypokalemia.  P Am labs BMP IV fluids KVO  HEMATOLOGIC   A:  Stable anemia.  P: lovenox for DVT Px - hold 12/17 prior to EGD  INFECTIOUS   A:  Suspected aspiration pneumonia. R empyema / vs parapneumonic effusion. P:   Cultures remain neg  Antibiotics as above  ENDOCRINE:  A: Chronic steroids for PMR.  At risk for adrenal insufficiency.  Enlarged nodular thyroid gland -- mult nodules. P: Change back to PO prednisone 5mg /day See thyroid ultrasound -  IR for fine needle bx once progressing further D/c SSI (not needing) and monitor glucose on BMET with chronic steroids  Ensure tsh done  NEUROLOGIC A:  Post op pain  P: Fentanyl / Oxycodone PRN Mobilize   Pt much improved post VATS for loculated effusion.  GI following for esophageal dysmotility.  Likely EGD with ?dilation in am.  Will tx to floor and give to [facility] for 12/17.    Danford Bad, NP 06/26/2012  11:34 AM Pager: [phone] or [phone]  I have fully examined this patient and agree with above findings.    And edited in full  Mcarthur Rossetti. Tyson Alias, MD, FACP Pgr: [phone] [facility]

## 2012-06-27 ENCOUNTER — Encounter (HOSPITAL_COMMUNITY): Payer: Self-pay

## 2012-06-27 ENCOUNTER — Encounter (HOSPITAL_COMMUNITY)
Admission: EM | Disposition: A | Discharge status: Home Health | Payer: Self-pay | Source: Home / Self Care | Attending: Pulmonary Disease

## 2012-06-27 ENCOUNTER — Inpatient Hospital Stay (HOSPITAL_COMMUNITY): Payer: Medicare Other

## 2012-06-27 ENCOUNTER — Telehealth: Payer: Self-pay

## 2012-06-27 DIAGNOSIS — J690 Pneumonitis due to inhalation of food and vomit: Secondary | ICD-10-CM | POA: Diagnosis present

## 2012-06-27 DIAGNOSIS — R1310 Dysphagia, unspecified: Secondary | ICD-10-CM

## 2012-06-27 DIAGNOSIS — R1319 Other dysphagia: Secondary | ICD-10-CM

## 2012-06-27 HISTORY — PX: ESOPHAGOGASTRODUODENOSCOPY (EGD) WITH ESOPHAGEAL DILATION: SHX5812

## 2012-06-27 LAB — BASIC METABOLIC PANEL
BUN: 9 mg/dL (ref 6–23)
Calcium: 9.1 mg/dL (ref 8.4–10.5)
Chloride: 101 mEq/L (ref 96–112)
Creatinine, Ser: 0.45 mg/dL — ABNORMAL LOW (ref 0.50–1.10)
GFR calc Af Amer: >90 mL/min (ref >90)

## 2012-06-27 LAB — CBC
HCT: 32.6 % — ABNORMAL LOW (ref 36.0–46.0)
MCHC: 31.9 g/dL (ref 30.0–36.0)
MCV: 98.8 fL (ref 78.0–100.0)
Platelets: 221 10*3/uL (ref 150–400)
RDW: 14.3 % (ref 11.5–15.5)

## 2012-06-27 LAB — BODY FLUID CULTURE: Culture: NO GROWTH

## 2012-06-27 LAB — GLUCOSE, CAPILLARY: Glucose-Capillary: 135 mg/dL — ABNORMAL HIGH (ref 70–99)

## 2012-06-27 SURGERY — ESOPHAGOGASTRODUODENOSCOPY (EGD) WITH ESOPHAGEAL DILATION
Anesthesia: Moderate Sedation

## 2012-06-27 MED ORDER — BUTAMBEN-TETRACAINE-BENZOCAINE 2-2-14 % EX AERO
INHALATION_SPRAY | CUTANEOUS | Status: DC | PRN
  Administered 2012-06-27: 2 via TOPICAL

## 2012-06-27 MED ORDER — FENTANYL CITRATE 0.05 MG/ML IJ SOLN
INTRAMUSCULAR | Status: AC
  Filled 2012-06-27: qty 2

## 2012-06-27 MED ORDER — MIDAZOLAM HCL 5 MG/ML IJ SOLN
INTRAMUSCULAR | Status: AC
  Filled 2012-06-27: qty 2

## 2012-06-27 MED ORDER — ENSURE COMPLETE PO LIQD: 237.0000 mL | Freq: Two times a day (BID) | ORAL | Status: DC

## 2012-06-27 MED ORDER — MIDAZOLAM HCL 10 MG/2ML IJ SOLN
INTRAMUSCULAR | Status: DC | PRN
  Administered 2012-06-27 (×2): 2 mg via INTRAVENOUS

## 2012-06-27 MED ORDER — DIPHENHYDRAMINE HCL 50 MG/ML IJ SOLN
INTRAMUSCULAR | Status: AC
  Filled 2012-06-27: qty 1

## 2012-06-27 MED ORDER — FENTANYL CITRATE 0.05 MG/ML IJ SOLN
INTRAMUSCULAR | Status: DC | PRN
  Administered 2012-06-27: 25 ug via INTRAVENOUS

## 2012-06-27 MED ORDER — LEVALBUTEROL HCL 0.63 MG/3ML IN NEBU
0.6300 mg | INHALATION_SOLUTION | Freq: Three times a day (TID) | RESPIRATORY_TRACT | Status: DC
  Administered 2012-06-27 – 2012-06-28 (×3): 0.63 mg via RESPIRATORY_TRACT
  Filled 2012-06-27 (×5): qty 3

## 2012-06-27 NOTE — Telephone Encounter (Signed)
Pt sees Dr. Faye Ramsay at [facility]. Pt scheduled for esophageal manometry at [facility] 07/03/12. Pt to arrive at [facility] clinic 2 H at 1:30pm for a 2pm appt. Pt may have a light breakfast and take her meds then NPO after 7am. Manometry lab number [phone]. Jennye Moccasin PA to notify pt of appt and prep instructions.

## 2012-06-27 NOTE — Interval H&P Note (Signed)
History and Physical Interval Note:  06/27/2012 11:05 AM  [name]  has presented today for surgery, with the diagnosis of problems swallowing, dysphagia.  The various methods of treatment have been discussed with the patient and family. After consideration of risks, benefits and other options for treatment, the patient has consented to  Procedure(s) (LRB) with comments: ESOPHAGOGASTRODUODENOSCOPY (EGD) WITH ESOPHAGEAL DILATION (N/A) as a surgical intervention .  The patient's history has been reviewed, patient examined, no change in status, stable for surgery.  I have reviewed the patient's chart and labs.  Questions were answered to the patient's satisfaction.     Venita Lick. Russella Dar MD Clementeen Graham

## 2012-06-27 NOTE — Progress Notes (Signed)
RT assisted patient with home CPAP. Unit set up in room.

## 2012-06-27 NOTE — Progress Notes (Signed)
Physical Therapy Treatment Patient Details Name: [name] MRN: [id number] DOB: [birth year] Today's Date: 06/27/2012 Time: 4098-1191 PT Time Calculation (min): 24 min  PT Assessment / Plan / Recommendation Comments on Treatment Session  pt more fatigue today during trial of gait, but she had just come back from EGD earlier today.    Follow Up Recommendations  SNF     Does the patient have the potential to tolerate intense rehabilitation     Barriers to Discharge        Equipment Recommendations  Other (comment) (TBD post acute)    Recommendations for Other Services    Frequency Min 3X/week   Plan      Precautions / Restrictions Precautions Precautions: Fall Restrictions Weight Bearing Restrictions: No   Pertinent Vitals/Pain EHR during gait from upper 100's to mid 110's; SaO2 down to 84% on 2L but up to 97% quickly oon 4 L.    Mobility  Bed Mobility Bed Mobility: Supine to Sit;Sitting - Scoot to Edge of Bed Supine to Sit: 5: Supervision;HOB elevated Sitting - Scoot to [place] of Bed: 5: Supervision Details for Bed Mobility Assistance: no assist needed, but mild struggle with rail to get to EOB Transfers Transfers: Sit to Stand;Stand to Sit Sit to Stand: 5: Supervision Stand to Sit: 5: Supervision Ambulation/Gait Ambulation/Gait Assistance: 4: Min guard;4: Min assist Ambulation Distance (Feet): 220 Feet Assistive device: Other (Comment) (pushing IV pole) Ambulation/Gait Assistance Details: As pt progressed, her pelvic drop on right (in part due to a hip replacement with resultant leg length discrepancy) became more pronounced as pt fatigued.  She needed 1 standing rest break at about the half way point Gait Pattern: Step-through pattern;Lateral trunk lean to right Stairs: No    Exercises     PT Diagnosis:    PT Problem List:   PT Treatment Interventions:     PT Goals Acute Rehab PT Goals Time For Goal Achievement: 07/10/12 Potential to Achieve Goals:  Good PT Goal: Supine/Side to Sit - Progress: Progressing toward goal PT Goal: Sit to Stand - Progress: Progressing toward goal PT Transfer Goal: Bed to Chair/Chair to Bed - Progress: Progressing toward goal PT Goal: Ambulate - Progress: Progressing toward goal  Visit Information  Last PT Received On: 06/27/12 Assistance Needed: +1    Subjective Data  Subjective: I wish I could do more for you, but I just not able this afternoon   Cognition  Overall Cognitive Status: Appears within functional limits for tasks assessed/performed Arousal/Alertness: Awake/alert Orientation Level: Appears intact for tasks assessed Behavior During Session: [facility] for tasks performed    Balance  Balance Balance Assessed: No Static Sitting Balance Static Sitting - Balance Support: Feet supported;No upper extremity supported Static Sitting - Level of Assistance: 5: Stand by assistance Static Standing Balance Static Standing - Balance Support: During functional activity;Right upper extremity supported Static Standing - Level of Assistance: 5: Stand by assistance  End of Session PT - End of Session Equipment Utilized During Treatment: Oxygen Activity Tolerance: Patient tolerated treatment well;Patient limited by fatigue Patient left: in chair;with call bell/phone within reach;with family/visitor present Nurse Communication: Mobility status   GP     Phinley Schall, Eliseo Gum 06/27/2012, 4:37 PM

## 2012-06-27 NOTE — Op Note (Addendum)
[facility] [address] [place], [address]   ENDOSCOPY PROCEDURE REPORT PATIENT: [name], [name]  MR#: [id number] BIRTHDATE: 1928/08/17 , 83  yrs. old GENDER: Female ENDOSCOPIST: Meryl Dare, MD, [facility] REFERRED BY:  Sheliah Plane, MD PROCEDURE DATE:  06/27/2012 PROCEDURE:  EGD, diagnostic ASA CLASS:     Class III INDICATIONS:  Dysphagia, abnormal abdominal x-ray. MEDICATIONS: medications were titrated to patient response per physician's verbal order, Fentanyl 50 mcg IV, and Versed 3 mg IV TOPICAL ANESTHETIC: Cetacaine Spray DESCRIPTION OF PROCEDURE: After the risks benefits and alternatives of the procedure were thoroughly explained, informed consent was obtained.  The Pentax Gastroscope [id number] endoscope was introduced through the mouth and advanced to the second portion of the duodenum  without limitations.  The instrument was slowly withdrawn as the mucosa was fully examined.  ESOPHAGUS: A diverticulum with a large opening was found in the lower third of the esophagus at 35 cm. The esophagus was dilated and  tortuous with retained fluid and semisolids. Mild resistance at LES. Aproximately 200cc suctioned from esophagus. STOMACH: The mucosa and folds of the stomach appeared normal. DUODENUM: The duodenal mucosa showed no abnormalities in the bulb and second portion of the duodenum.  Retroflexed views revealed no abnormalities.   The scope was then withdrawn from the patient and the procedure completed.  COMPLICATIONS: There were no complications.  ENDOSCOPIC IMPRESSION: 1.   Diverticulum with a large opening in the lower third of the esophagus 2.   Dilated and tortuous esophagus-R/O achalasia 3.   Otherwise normal EGD  RECOMMENDATIONS: 1.  Anti-reflux regimen 2.  Esophogeal manometry which can be done as outpatient or inpatient on Monday 3.  Full liquids and Ensure until treatment plan is established    eSigned:  Meryl Dare, MD, [facility] 06/27/2012 12:00 PM Revised: 06/27/2012 12:00 PM  GU:[id number] Arlyce Dice, MD

## 2012-06-27 NOTE — Progress Notes (Addendum)
                    [address]            [name] [address]          [phone]     4 Days Post-Op Procedure(s) (LRB): VIDEO BRONCHOSCOPY (N/A) VIDEO ASSISTED THORACOSCOPY (VATS)/DECORTICATION (Right)  Subjective: "My breathing is so much better."  More comfortable since CTs d/c'ed.  Taking clears without difficulty.   Objective: Vital signs in last 24 hours: Patient Vitals for the past 24 hrs:  BP Temp Temp src Pulse Resp SpO2  06/27/12 0652 121/56 mmHg 98.5 F (36.9 C) Oral 88  18  96 %  06/26/12 1259 169/82 mmHg 98.2 F (36.8 C) Oral 80  19  95 %  06/26/12 1200 116/51 mmHg - - 74  20  95 %  06/26/12 1139 - 97.7 F (36.5 C) Oral - - -  06/26/12 1100 107/51 mmHg - - 77  18  96 %  06/26/12 1000 109/55 mmHg - - 89  21  95 %  06/26/12 0900 116/47 mmHg - - 87  23  96 %   Current Weight  06/26/12 167 lb 5.3 oz (75.9 kg)     Intake/Output from previous day: 12/16 0701 - 12/17 0700 In: 1330 [P.O.:1080; I.V.:40; IV Piggyback:210] Out: 800 [Urine:800]    PHYSICAL EXAM:  Heart: RRR Lungs: Slightly decreased BS R base Wound: Clean and dry    Lab Results: CBC: Basename 06/27/12 0455 06/26/12 0435  WBC 10.2 10.1  HGB 10.4* 10.1*  HCT 32.6* 31.2*  PLT 221 209   BMET:  Basename 06/27/12 0455 06/26/12 0435  NA 142 141  K 3.6 3.7  CL 101 101  CO2 35* 34*  GLUCOSE 113* 109*  BUN 9 13  CREATININE 0.45* 0.42*  CALCIUM 9.1 9.0    PT/INR: No results found for this basename: LABPROT,INR in the last 72 hours    Assessment/Plan: S/P Procedure(s) (LRB): VIDEO BRONCHOSCOPY (N/A) VIDEO ASSISTED THORACOSCOPY (VATS)/DECORTICATION (Right) Pulm - stable.  Continue pulm toilet, will check CXR this am since one not ordered. GI- for EGD this am. Ambulate as tolerated. Medical issues per primary service.   LOS: 8 days    COLLINS,GINA H 06/27/2012   I have seen and examined [name] and agree with the above assessment  and  plan.  Delight Ovens MD Beeper [phone] Office [phone] 06/27/2012 5:45 PM

## 2012-06-27 NOTE — Progress Notes (Signed)
Pt has manometry study for 07/03/12 at [facility].   She must go to [facility], Clinic 2-H. Arrive 1:30 for 2 PM appt. May have light breakfast and AM meds that morning, should not eat or drink after 9 AM.  Contact phone for manometry clinic is [phone].  I passed this info into Provider navigator and asked RN to write it down for the patient.   Jennye Moccasin  PA-C  Dominiq Fontaine T. Russella Dar MD Clementeen Graham

## 2012-06-27 NOTE — Progress Notes (Signed)
[facility] PROGRESS NOTE  [name] VWU:[id number] DOB: December 21, 1928 DOA: 06/19/2012 PCP: Jill Relic, MD  BRIEF PATIENT DESCRIPTION: 78 yo polymyalgia rheumatica, dilated esophagus. Admitted 12/9 with large loculated pleural effusion and mediastinal mass. Underwent VATS decortication 12/13 and remained vented post op.   Assessment/Plan:  Right pleural effusion/empyema S/P VATS. Postop respiratory failure, resolved. OSA-home CPAP.  Chest he was discontinued on 12/16.  Continue nightly CPAP.  CVTS following.  Continue antibiotics and pulmonary toilet.  Wean oxygen.  Cultures negative to date.  Suspected aspiration pneumonia  Secondary to esophageal dysmotility and possible aclasia.  Continue IV antibiotics.  Diet as advised below.  Severe dysphagia, esophageal dysmotility, Megaesophagus with retained food, suspected achalasia  Had EGD on 12/17 and 200 cc of fluid/food removed.  GI arranging for outpatient esophageal manometry.  Continue current liquid diet and do not advance until GI recommends.  High aspiration risk.  Polymyalgia rheumatica  Was on stress dose IV hydrocortisone. Changed to oral prednisone  Currently asymptomatic.  Intermittent hypertension  Continue metoprolol 12.5 by mouth twice a day.  Controlled  Hypokalemia  Repleted  Anemia  Stable  Enlarged multi-nodular thyroid  Will need FNA at some point.  TSH normal-0.451   Code Status: Full  Family Communication: Discussed with patient.  Disposition Plan: SNF when stable.   Consultants:  Pulmonary and critical care team-signed off on 12/16  [facility] [place] [facility]  Procedures:  ETT 12/13 >>> 12/14   R IJ CVL 12/13 >>>   R CHEST TUBE 12/13 >>>12/16  Thoracentesis, right 12/12  VATS 12/13   Antibiotics:  Levaquin 12/9 >>>off   Zinacef 12/14 x 2 doses   Unasyn 12/12 >>>   HPI/Subjective: Denies complaints. Specifically denies chest pain or  dyspnea. Seen this morning before EGD.   Objective: Filed Vitals:   06/27/12 1150 06/27/12 1200 06/27/12 1210 06/27/12 1329  BP: 116/57 113/61 113/61 119/73  Pulse:      Temp:    98.6 F (37 C)  TempSrc:    Oral  Resp: 20 29 24 19   Height:      Weight:      SpO2: 95% 92% 91% 93%    Intake/Output Summary (Last 24 hours) at 06/27/12 1454 Last data filed at 06/27/12 0730  Gross per 24 hour  Intake    240 ml  Output    950 ml  Net   -710 ml   Filed Weights   06/24/12 0500 06/25/12 0600 06/26/12 0600  Weight: 77.5 kg (170 lb 13.7 oz) 75.5 kg (166 lb 7.2 oz) 75.9 kg (167 lb 5.3 oz)    Exam:   General exam: Pleasant and comfortable elderly female, sitting at edge of bed.   Respiratory system: Reduced breath sounds right base/bronchial breath sounds. Rest of lung fields are clear. No increased work of breathing.  Cardiovascular system: First and second heart sounds heard, regular rate and rhythm. No JVD, murmurs or gallops. Telemetry shows sinus rhythm.  Gastrointestinal system: Abdomen is nondistended, soft and nontender. Normal bowel sounds heard.   Central nervous system: Alert and oriented. No focal neurological deficits.  Extremities: Symmetric 5 x 5 power. Trace bilateral ankle edema  Data Reviewed: Basic Metabolic Panel:  Lab 06/27/12 2956 06/26/12 0435 06/25/12 0401 06/24/12 0430 06/23/12 2059  NA 142 141 144 141 137  K 3.6 3.7 3.3* 3.3* 3.1*  CL 101 101 103 100 95*  CO2 35* 34* 36* 34* 33*  GLUCOSE 113* 109* 145* 137* 153*  BUN 9 13  12 16 16   CREATININE 0.45* 0.42* 0.45* 0.47* 0.40*  CALCIUM 9.1 9.0 9.1 9.1 9.3  MG -- -- -- -- --  PHOS -- -- -- -- --   Liver Function Tests:  Lab 06/25/12 0401 06/24/12 0430 06/23/12 1104 06/23/12 0840  AST 16 15 18 20   ALT 12 11 15 16   ALKPHOS 130* 116 129* 130*  BILITOT 0.4 0.5 0.4 0.5  PROT 5.5* 5.3* 6.2 6.5  ALBUMIN 2.0* 2.0* 2.3* 2.4*   No results found for this basename: LIPASE:5,AMYLASE:5 in the last 168  hours No results found for this basename: AMMONIA:5 in the last 168 hours CBC:  Lab 06/27/12 0455 06/26/12 0435 06/25/12 0401 06/24/12 0430 06/23/12 1104  WBC 10.2 10.1 12.3* 13.4* 12.1*  NEUTROABS -- -- -- 11.6* --  HGB 10.4* 10.1* 11.1* 10.7* 11.9*  HCT 32.6* 31.2* 33.8* 31.7* 34.8*  MCV 98.8 97.2 96.3 94.1 94.8  PLT 221 209 204 168 186   Cardiac Enzymes: No results found for this basename: CKTOTAL:5,CKMB:5,CKMBINDEX:5,TROPONINI:5 in the last 168 hours BNP (last 3 results)  Basename 06/21/12 1820  PROBNP 222.6   CBG:  Lab 06/26/12 2112 06/26/12 1141 06/26/12 0737 06/26/12 0353 06/25/12 2357  GLUCAP 104* 135* 111* 107* 99    Recent Results (from the past 240 hour(s))  BODY FLUID CULTURE     Status: Normal   Collection Time   06/22/12  4:19 PM      Component Value Range Status Comment   Specimen Description PLEURAL   Final    Special Requests NONE   Final    Gram Stain     Final    Value: RARE WBC PRESENT, PREDOMINANTLY PMN     NO ORGANISMS SEEN   Culture NO GROWTH 3 DAYS   Final    Report Status 06/25/2012 FINAL   Final   MRSA PCR SCREENING     Status: Normal   Collection Time   06/22/12  8:32 PM      Component Value Range Status Comment   MRSA by PCR NEGATIVE  NEGATIVE Final   URINE CULTURE     Status: Normal   Collection Time   06/23/12  3:30 PM      Component Value Range Status Comment   Specimen Description URINE, CLEAN CATCH   Final    Special Requests NONE   Final    Culture  Setup Time 06/23/2012 16:50   Final    Colony Count NO GROWTH   Final    Culture NO GROWTH   Final    Report Status 06/25/2012 FINAL   Final   CULTURE, RESPIRATORY     Status: Normal   Collection Time   06/23/12  5:35 PM      Component Value Range Status Comment   Specimen Description BRONCHIAL WASHINGS   Final    Special Requests NONE   Final    Gram Stain     Final    Value: FEW WBC PRESENT, PREDOMINANTLY PMN     RARE SQUAMOUS EPITHELIAL CELLS PRESENT     NO ORGANISMS SEEN    Culture Non-Pathogenic Oropharyngeal-type Flora Isolated.   Final    Report Status 06/26/2012 FINAL   Final   AFB CULTURE WITH SMEAR     Status: Normal (Preliminary result)   Collection Time   06/23/12  5:44 PM      Component Value Range Status Comment   Specimen Description PLEURAL FLUID RIGHT   Final    Special Requests PATIENT  ON FOLLOWING UNASYN   Final    ACID FAST SMEAR NO ACID FAST BACILLI SEEN   Final    Culture     Final    Value: CULTURE WILL BE EXAMINED FOR 6 WEEKS BEFORE ISSUING A FINAL REPORT   Report Status PENDING   Incomplete   BODY FLUID CULTURE     Status: Normal   Collection Time   06/23/12  5:44 PM      Component Value Range Status Comment   Specimen Description PLEURAL FLUID RIGHT   Final    Special Requests PATIENT ON FOLLOWING UNASYN   Final    Gram Stain     Final    Value: NO WBC SEEN     NO ORGANISMS SEEN   Culture NO GROWTH 3 DAYS   Final    Report Status 06/27/2012 FINAL   Final   FUNGUS CULTURE W SMEAR     Status: Normal (Preliminary result)   Collection Time   06/23/12  5:44 PM      Component Value Range Status Comment   Specimen Description PLEURAL FLUID RIGHT   Final    Special Requests PATIENT ON FOLLOWING UNASYN   Final    Fungal Smear NO YEAST OR FUNGAL ELEMENTS SEEN   Final    Culture CULTURE IN PROGRESS FOR FOUR WEEKS   Final    Report Status PENDING   Incomplete      Studies: Dg Chest 2 View  06/27/2012  *RADIOLOGY REPORT*  Clinical Data: Right-sided empyema, chest tube removal, follow-up  CHEST - 2 VIEW  Comparison: Portable chest x-ray of 06/26/2012 and CT chest of 06/19/2012  Findings: The right chest tubes have been removed.  No pneumothorax is seen.  Pleural and parenchymal opacity at the right lung base appears stable most consistent with effusion, possibly loculated as well as atelectasis and/or pneumonia.  A tiny left pleural effusion blunts the left costophrenic angle.  A permanent pacemaker remains. As noted on the prior CT, the  esophagus is markedly dilated with air-fluid level within the upper thoracic esophagus.  These findings are most consistent with achalasia or distal esophageal stricture.  The bones are diffusely osteopenic.  IMPRESSION:  1.  Chest tubes removed.  No right pneumothorax. 2.  No change in pleural and parenchymal opacity at the right lung base. 3.  No change in dilated esophagus.  Consider achalasia or distal esophageal stricture.   Original Report Authenticated By: Dwyane Dee, M.D.    Dg Chest Port 1 View  06/26/2012  *RADIOLOGY REPORT*  Clinical Data: Pleural effusion  PORTABLE CHEST - 1 VIEW  Comparison: Yesterday  Findings: Stable right internal jugular vein central venous catheter and right chest tubes.  No pneumothorax.  Moderate right pleural effusion and volume loss stable.  Small left pleural effusion and volume loss unchanged.  Dual lead left subclavian pacemaker device and leads are unchanged.  IMPRESSION: Stable.  Right chest tubes remain in place without pneumothorax. Stable bilateral pleural effusions right greater than left.   Original Report Authenticated By: Jolaine Click, M.D.     Scheduled Meds:    . ampicillin-sulbactam (UNASYN) IV  1.5 g Intravenous Q6H  . bisacodyl  10 mg Oral Daily  . docusate sodium  100 mg Oral BID  . enoxaparin  30 mg Subcutaneous Q24H  . levalbuterol  0.63 mg Nebulization Q6H  . metoprolol tartrate  12.5 mg Oral Daily  . pantoprazole  40 mg Oral Q0600  . predniSONE  5 mg  Oral Q breakfast   Continuous Infusions:    . sodium chloride 20 mL/hr at 06/26/12 2026  . dextrose 5 % and 0.45% NaCl 20 mL/hr at 06/25/12 1400    Principal Problem:  *Empyema Active Problems:  Polymyalgia rheumatica  Pacemaker  Megaesophagus  Aspiration into respiratory tract  Multinodular thyroid  Other dysphagia    Time spent: 45 minutes    [facility]  [facility] Pager [phone]. If 8PM-8AM, please contact night-coverage at [email or web address], password  [facility] 06/27/2012, 2:54 PM  LOS: 8 days

## 2012-06-28 ENCOUNTER — Encounter (HOSPITAL_COMMUNITY): Payer: Self-pay | Admitting: Gastroenterology

## 2012-06-28 DIAGNOSIS — E042 Nontoxic multinodular goiter: Secondary | ICD-10-CM

## 2012-06-28 LAB — BASIC METABOLIC PANEL
CO2: 31 mEq/L (ref 19–32)
Calcium: 9.3 mg/dL (ref 8.4–10.5)
Chloride: 101 mEq/L (ref 96–112)
GFR calc Af Amer: >90 mL/min (ref >90)
Sodium: 141 mEq/L (ref 135–145)

## 2012-06-28 MED ORDER — BISACODYL 5 MG PO TBEC: 10.0000 mg | DELAYED_RELEASE_TABLET | Freq: Every day | ORAL | Status: DC

## 2012-06-28 MED ORDER — AMOXICILLIN-POT CLAVULANATE 875-125 MG PO TABS: 1.0000 | ORAL_TABLET | Freq: Two times a day (BID) | ORAL | Status: DC

## 2012-06-28 MED ORDER — DSS 100 MG PO CAPS: 100.0000 mg | ORAL_CAPSULE | Freq: Two times a day (BID) | ORAL | Status: DC

## 2012-06-28 MED ORDER — ALBUTEROL SULFATE HFA 108 (90 BASE) MCG/ACT IN AERS: 2.0000 | INHALATION_SPRAY | Freq: Four times a day (QID) | RESPIRATORY_TRACT | Status: DC | PRN

## 2012-06-28 MED ORDER — OMEPRAZOLE 40 MG PO CPDR: 40.0000 mg | DELAYED_RELEASE_CAPSULE | Freq: Every day | ORAL | Status: DC

## 2012-06-28 NOTE — Discharge Summary (Addendum)
Physician Discharge Summary  [name] ZHY:[id number] DOB: 05-Jan-1929 DOA: 06/19/2012  PCP: Jill Relic, MD  Admit date: 06/19/2012 Discharge date: 06/28/2012  Recommendations for Outpatient Follow-up:  1. Home health PT, OT, and aid 2. Home oxygen 3L at all times 3. CPAP with previous settings and with 3L O2 bled in.   4. Manometry:  [facility]. Arrive 1:30 for 2 PM appt.  May have light breakfast and AM meds that morning, should not eat or drink after 9 AM. Contact phone for manometry clinic is [phone].  5. Follow up with gastroenterology after manometry testing is complete 6. Follow up with cardiothoracic surgery in 2-4 weeks  Discharge Diagnoses:  Principal Problem:  *Empyema Active Problems:  Polymyalgia rheumatica  Pacemaker  Megaesophagus  Aspiration into respiratory tract  Multinodular thyroid  Other dysphagia  Aspiration pneumonia   Discharge Condition: stable, improved  Diet recommendation:  Full liquid diet  Wt Readings from Last 3 Encounters:  06/26/12 75.9 kg (167 lb 5.3 oz)  06/26/12 75.9 kg (167 lb 5.3 oz)  06/26/12 75.9 kg (167 lb 5.3 oz)    History of present illness:   [name] is a 78 y.o. female  With past medical history polymyalgia rheumatica as well as pacemaker who for the last 2 days of progressively worsening shortness of breath. Patient did not really have been associated wheezing or cough but it got to the bone she felt she could barely catch her breath and came into the emergency room today for further evaluation. In the emergency room, she was noted to have a mild leukocytosis of 12-13 and a chest x-ray noted a large right pleural effusion with some loculated fluid along the right horizontal fissure in the paramediastinal mass superior to the right hilum. This is concerning for the possibility of malignancy versus infectious process. Hospitals were called for further evaluation and treatment. Patient was given  medication for pain as well as oxygen which improved her symptoms.   Hospital Course:   [name] was diagnosed with aspiration pneumonia secondary to esophageal dysmotility and empyema.  Thoracentesis on 12/12 demonstrated an exudutive effusion/empyema and she underwent intubation and VATS procedure on 12/13.  She remained intubated for 1 day and was extubated on 12/14.  She had a chest tube in place from 12/13 to 12/16 which was removed without complication.  Cultures from her thoracentesis and her VATs were negative for bacteria, AFB, fungi and yeast to tade.  She continue pulmonary toilet and was placed on Unasyn for treatment of pneumonia.  She has been on antibiotics since 12/12 and today is day 7.  Given the severity of her pneumonia, will treat for an additional three days to complete a 10-day course.  She requires oxygen via nasal canula, 3L with rest and exertion due to respiratory insufficiency.  During exertion, she desaturates to high 70s via pulse oximetry.    Suspected aspiration pneumonia:   Secondary to esophageal dysmotility and possible acalasia.  Continue IV antibiotics.  Diet as advised below.  Severe dysphagia, esophageal dysmotility, megaesophagus with retained food, suspected achalasia.  Had EGD on 12/17 demonstrated a diverticulum with a large opening in the lower thrid of the esophagus with 200 cc of retained fluid/food which was removed. She had a dilated and tortuous esophagus.  She should continue a full liquid diet with carnation instant breakfast TID until she has completed her manometry test on 12/23 and has followed up with gastroenterology for results.  She will be called by the GI clinic with an appointment time and date.    Polymyalgia rheumatica She was initially treated with stress dose hydrocortisone due to acute illness, but was transitioned back to oral prednisone.    Intermittent hypertension Continued metoprolol 12.5 by mouth twice a day with stable blood  pressures.    Anemia, normocytic, mild, and stable.  Defer to outpatient management.    Enlarged multi-nodular thyroid TSH normal-0.451.  US demonstrated multi nodular goiter with large nodules in both lobes of the gland that meet criteria for fine needle aspiration.  Will need outpatient follow up by primary care doctor for referral when clinically improved.    Consultants:  Pulmonary and critical care team-signed off on 12/16  [place] [facility] Procedures:  ETT 12/13 >>> 12/14  R IJ CVL 12/13 >>>  R CHEST TUBE 12/13 >>>12/16  Thoracentesis, right 12/12  VATS 12/13 US neck 12/15 Antibiotics:  Levaquin 12/9 >>>off  Zinacef 12/14 x 2 doses  Unasyn 12/12 >>>12/18, then Augmentin for 3 more days  Discharge Exam: Filed Vitals:   06/28/12 0517  BP: 126/57  Pulse: 94  Temp: 98.2 F (36.8 C)  Resp: 18   Filed Vitals:   06/27/12 2015 06/27/12 2146 06/28/12 0517 06/28/12 0907  BP: 141/63  126/57   Pulse: 91  94   Temp: 98.5 F (36.9 C)  98.2 F (36.8 C)   TempSrc: Oral  Oral   Resp: 18  18   Height:      Weight:      SpO2: 97% 94% 96% 96%    General: Caucasian female, overweight, no acute distress sitting in chair, nasal canula in place HEENT:  MMM Cardiovascular: RRR, no murmurs, rubs, or gallops Respiratory: Rhonchi present and bronchial breath sounds throughout the right posterior chest.  No rales.   ABD:  NABS, soft, nondistended, nontender, no organomegaly MSK:  No LEE  Discharge Instructions      Discharge Orders    Future Orders Please Complete By Expires   Increase activity slowly      Discharge instructions      Comments:   You may have a condition called achalasia where the esophagus does not contract and relax normally causing food to accumulate in your esophagus.  This can cause food to regurgitate and be aspirated.  You developed an aspiration pneumonia which led to infection around your lung which needed to be drained.  You underwent VATS  procedure by cardiothoracic surgery on 12/13 and you had a chest tube placed for few days to drain residual fluid.  Your chest tube was removed on 12/16.  You were started on antibiotics to treat infection and your cultures have all been negative so far.  Please continue an additional 3 days of antibiotics starting tomorrow with augmentin, which is a twice a day medication.  You also had endoscopy done by gastroenterology.  Please eat a liquid diet until you have manometry done at [facility] and the GI doctors will see you in clinic afterwards to review the results.  Information about "full liquid diet" is included in your paperwork.  Please drink carnation instant breakfast 2 or 3 times a day to ensure you get adequate calories and nutrition in the meantime.    Oxygen, physical therapy, and occupational therapy will be arranged for you.   Call MD for:  temperature >100.4      Call MD for:  persistant nausea and vomiting      Call MD for:  severe uncontrolled pain      Call MD for:  difficulty breathing, headache or visual disturbances      Call MD for:  redness, tenderness, or signs of infection (pain, swelling, redness, odor or green/yellow discharge around incision site)      Call MD for:  hives      Call MD for:  persistant dizziness or light-headedness      Call MD for:  extreme fatigue          Medication List     As of 06/28/2012  3:19 PM    STOP taking these medications         metoprolol tartrate 25 MG tablet   Commonly known as: LOPRESSOR      TAKE these medications         albuterol 108 (90 BASE) MCG/ACT inhaler   Commonly known as: PROVENTIL HFA;VENTOLIN HFA   Inhale 2 puffs into the lungs every 6 (six) hours as needed for wheezing.      amoxicillin-clavulanate 875-125 MG per tablet   Commonly known as: AUGMENTIN   Take 1 tablet by mouth 2 (two) times daily.      b complex vitamins tablet   Take 1 tablet by mouth daily.      bisacodyl 5 MG EC tablet   Commonly known as:  DULCOLAX   Take 2 tablets (10 mg total) by mouth daily.      DSS 100 MG Caps   Take 100 mg by mouth 2 (two) times daily.      hydrochlorothiazide 25 MG tablet   Commonly known as: HYDRODIURIL   Take 25 mg by mouth daily.      metoprolol succinate 25 MG 24 hr tablet   Commonly known as: TOPROL-XL   Take 25 mg by mouth daily. Verified that patient takes XL formulation by medication bottle.      omeprazole 40 MG capsule   Commonly known as: PRILOSEC   Take 1 capsule (40 mg total) by mouth daily.      predniSONE 5 MG tablet   Commonly known as: DELTASONE   Take 5 mg by mouth daily.      VITAMIN A & D PO   Take 1 capsule by mouth daily.      vitamin C 500 MG tablet   Commonly known as: ASCORBIC ACID   Take 500 mg by mouth daily.        Follow-up Information    Follow up with Manometry. On 07/03/2012. (go to [facility]  clinic 2-H.  arrive by 1:30 for 2 PM manometry test.  can have light breakfast and take AM meds that day,before 9AM.  nothing by mouth after that.  the contact phone for manometry lab is [phone]. )       Follow up with Judie Petit T. Russella Dar, MD,FACG. Schedule an appointment as soon as possible for a visit in 2 weeks. (follow up manometry results)    Contact information:   [address] [place] [address] [phone]       Follow up with GERHARDT,EDWARD B, MD. Schedule an appointment as soon as possible for a visit in 1 month. (f/u VATS)    Contact information:   [address] [place] [address] [phone]           The results of significant diagnostics from this hospitalization (including imaging, microbiology, ancillary and laboratory) are listed below for reference.  Significant Diagnostic Studies: Dg Chest 1 View  06/22/2012  *RADIOLOGY REPORT*  Clinical Data: Status post right-sided thoracentesis.  CHEST - 1 VIEW  Comparison: Chest x-ray 06/22/2012.  Findings: Compared to the prior  examination, there is slight decrease in right-sided pleural effusion, however, this remains large.  No definite pneumothorax.  Trace left pleural effusion. Poorer aeration in the right lung may reflect a passive atelectasis and/or air space consolidation.  Unusual lucency in the region of the mediastinum is likely to represent the patient's very dilated esophagus, likely with some internal food or debris.  Heart size is within normal limits. The patient is rotated to the left on today's exam, resulting in distortion of the mediastinal contours and reduced diagnostic sensitivity and specificity for mediastinal pathology.  Atherosclerosis in the thoracic aorta.  Left-sided pacemaker device in place with lead tips projecting over expected location of the right atrium and right ventricular apex.  Large air fluid level projecting over the lower mediastinum, likely from a large esophageal hiatal hernia.  IMPRESSION: 1.  Overall, the radiographic appearance of chest is similar to the prior study, with exception of slight decrease in size of large right-sided pleural effusion following thoracentesis.  No pneumothorax or other complicating features.   Original Report Authenticated By: Trudie Reed, M.D.    Dg Chest 2 View  06/27/2012  *RADIOLOGY REPORT*  Clinical Data: Right-sided empyema, chest tube removal, follow-up  CHEST - 2 VIEW  Comparison: Portable chest x-ray of 06/26/2012 and CT chest of 06/19/2012  Findings: The right chest tubes have been removed.  No pneumothorax is seen.  Pleural and parenchymal opacity at the right lung base appears stable most consistent with effusion, possibly loculated as well as atelectasis and/or pneumonia.  A tiny left pleural effusion blunts the left costophrenic angle.  A permanent pacemaker remains. As noted on the prior CT, the esophagus is markedly dilated with air-fluid level within the upper thoracic esophagus.  These findings are most consistent with achalasia or distal  esophageal stricture.  The bones are diffusely osteopenic.  IMPRESSION:  1.  Chest tubes removed.  No right pneumothorax. 2.  No change in pleural and parenchymal opacity at the right lung base. 3.  No change in dilated esophagus.  Consider achalasia or distal esophageal stricture.   Original Report Authenticated By: Dwyane Dee, M.D.    Dg Chest 2 View  06/23/2012  *RADIOLOGY REPORT*  Clinical Data: Right pleural effusion.  Shortness of breath.  CHEST - 2 VIEW  Comparison: 12/13 and 06/22/2012  Findings: Large right pleural effusion and left lower lobe compressive atelectasis persist essentially unchanged.  Marked dilatation and tortuosity of the esophagus consistent with achalasia.  Tiny left effusion.  Pulmonary vascularity on the left is normal. Overall heart size appears normal.  IMPRESSION: Persistent large loculated right pleural effusion.  Tiny left effusion.   Original Report Authenticated By: Francene Boyers, M.D.    Dg Chest 2 View  06/22/2012  *RADIOLOGY REPORT*  Clinical Data: Shortness of breath and weakness.  CHEST - 2 VIEW  Comparison: CT and plain films of the chest 06/19/2012.  Findings: Large right pleural effusion has markedly increased with associated progression of airspace disease.  There now appears to be a small to moderate left pleural effusion.  Dilatation of the esophagus with food in the upper esophagus as seen on CT scan is again identified.  Heart size is upper normal.  IMPRESSION:  1.  Marked increase in right pleural effusion and airspace disease. There is  now a small to moderate left pleural effusion as well. 2.  Markedly dilated esophagus containing food material again seen.   Original Report Authenticated By: Holley Dexter, M.D.    Dg Chest 2 View  06/19/2012  *RADIOLOGY REPORT*  Clinical Data: Fever, right-sided chest pain  CHEST - 2 VIEW  Comparison: None.  Findings: Normal cardiac silhouette.  There is a large right effusion at the right lung base.  There is a large  smoothly marginated density along the right mediastinum superior to the hilum.  This could represent the esophagus or loculated fluid. Cannot exclude a paramediastinal mass.  There is a loculated fluid collection along the right horizontal fissure.  No pneumothorax. Left pacemaker in place.  IMPRESSION:  1.  Large right effusion. 2.  Paramediastinal mass superior to the right hilum.  Differential includes loculated fluid,  postsurgical esophagus,  or paramediastinal mass.  Recommend CT thorax with contrast for further evaluation. 3. Loculated fluid along the right horizontal fissure.   Original Report Authenticated By: Genevive Bi, M.D.    Ct Chest W Contrast  06/19/2012  *RADIOLOGY REPORT*  Clinical Data: Shortness of breath.  Abnormal chest x-ray.  CT CHEST WITH CONTRAST  Technique:  Multidetector CT imaging of the chest was performed following the standard protocol during bolus administration of intravenous contrast.  Contrast:  80 ml Omnipaque-300  Comparison: Chest x-ray from earlier today  Findings: The thyroid gland is markedly enlarged heterogeneous with several nodular areas.  No axillary lymphadenopathy.  No mediastinal or hilar lymphadenopathy.  The  The esophagus is markedly dilated and tortuous, measuring up to 6 cm in diameter above the level of the carina.  Below the level of the carina, the esophagus measures up to 5.1 cm in diameter.  Left-sided permanent pacemaker noted.  Heart size is normal.  No pericardial effusion.  No left pleural effusion.  The patient does have a moderate right pleural effusion with fluid in the major fissure which accounts for the appearance on the chest x-ray earlier today.  Lung windows show some consolidative change in the central right lower lobe.  No focal consolidation in the left lung.Bone windows reveal no worrisome lytic or sclerotic osseous lesions.  Images which include the upper abdomen show a 12 mm heavily calcified saccular aneurysm of the splenic  artery in the hilum.  IMPRESSION: Right pleural effusion with fluid in the fissure accounting for the mass-like appearance of the right lung base.  Markedly dilated, tortuous esophagus filled with fluid and food debris. Achalasia or distal esophageal obstruction could produce this appearance.  Markedly enlarged and nodular thyroid gland.  Thyroid ultrasound could be used to further evaluate.   Original Report Authenticated By: Kennith Center, M.D.    US Soft Tissue Head/neck  06/25/2012  *RADIOLOGY REPORT*  Clinical Data: Thyroid nodule.  THYROID ULTRASOUND  Technique: Ultrasound examination of the thyroid gland and adjacent soft tissues was performed.  Comparison:  None.  Findings:  Right thyroid lobe:  5.4 x 3.1 x 3.6 cm. Left thyroid lobe:  6.9 x 3.2 x 3.1 cm. Isthmus:  0.8 cm.  Focal nodules:  There are numerous nodules throughout both glands. The dominant nodule in the superior aspect of the right lobe measures 3.1 x 2.3 x 2.4 cm.  This is a complex nodule which is primarily solid.  There is a 2.9 x 3.2 x 3.2 cm complex primarily solid nodule in the left lower pole.  There are multiple other nodules throughout the gland.  There is  hypervascularity of both lobes.  Lymphadenopathy:  None visualized.  IMPRESSION: Multi nodular goiter.  Large nodules in both lobes of the gland. These nodules fit national criteria for fine needle aspiration biopsy if not previously assessed.   Original Report Authenticated By: Francene Boyers, M.D.    Dg Chest Port 1 View  06/26/2012  *RADIOLOGY REPORT*  Clinical Data: Pleural effusion  PORTABLE CHEST - 1 VIEW  Comparison: Yesterday  Findings: Stable right internal jugular vein central venous catheter and right chest tubes.  No pneumothorax.  Moderate right pleural effusion and volume loss stable.  Small left pleural effusion and volume loss unchanged.  Dual lead left subclavian pacemaker device and leads are unchanged.  IMPRESSION: Stable.  Right chest tubes remain in place  without pneumothorax. Stable bilateral pleural effusions right greater than left.   Original Report Authenticated By: Jolaine Click, M.D.    Dg Chest Portable 1 View  06/25/2012  *RADIOLOGY REPORT*  Clinical Data: Postop VATS  PORTABLE CHEST - 1 VIEW  Comparison: Chest radiograph 06/14/2012  Findings: Interval extubation. There are two right chest tubes in place.  There is volume loss in the right hemithorax with no evidence of pneumothorax.  There is pleural fluid in the right lung base.  Left lung is clear with a small effusion.  There is improved aeration the left lung base .  Normal heart silhouette.  IMPRESSION:  1.  Volume loss in the right hemithorax with pleural fluid and two chest tubes.  No pneumothorax. 2.  Some improvement in aeration to the left lung base. 3.  Extubation.   Original Report Authenticated By: Genevive Bi, M.D.    Dg Chest Port 1 View  06/24/2012  *RADIOLOGY REPORT*  Clinical Data: Assess edema  PORTABLE CHEST - 1 VIEW  Comparison: Most recent prior chest x-ray 06/23/2012  Findings: Unchanged position of left subclavian approach cardiac rhythm maintenance device.  Leads project over the right atrium and right ventricle.  Endotracheal tube remains in similar position with the tip 2.2 cm above the carina.  Right IJ approach v central venous catheter also unchanged.  Tip projects over the superior cavoatrial junction.  To right-sided thoracostomy tubes are unchanged.  The proximal side holes appear extrathoracic on both tubes.  This is unchanged.  Persistent cardiomegaly and right worse than left airspace disease with bilateral effusions.  No pneumothorax identified.  IMPRESSION:  1.  Unchanged position of support apparatus.  Please note the tip of the endotracheal tube is 2 cm above the carina and the proximal side holes of both thoracostomy tubes appear extrathoracic. 2.  Similar appearance of bilateral right greater than left pleural effusions and associated bibasilar opacities  which may reflect atelectasis and / or infection.   Original Report Authenticated By: Malachy Moan, M.D.    Dg Chest Portable 1 View  06/23/2012  *RADIOLOGY REPORT*  Clinical Data: Postop VATS with right chest tube insertion.  PORTABLE CHEST - 1 VIEW  Comparison: 06/23/2012 at 1306.  Findings: Left-sided pacemaker unchanged.  Right IJ central venous catheter has tip over the cavoatrial junction.  There is an endotracheal tube with tip 2 cm above the carina.  There are two right-sided chest tubes with the nonradiopaque port of both chest tubes external to the right thoracic cavity.  Lungs are hypoinflated with continued evidence of a moderate-sized right pleural fluid collection with opacification in the right base likely atelectasis although cannot exclude infection.  There is mild opacification in the left base which is worse likely  a small amount of pleural fluid/atelectasis. Suggestion of a tiny right apical pneumothorax is seen.  Remainder of the exam is unchanged.  IMPRESSION: Continued moderate right pleural effusion with opacification in the right base likely associated atelectasis although cannot exclude infection.  Mild worsening opacification in the left base likely a small effusions/atelectasis.  Suggestion of a tiny right apical pneumothorax.  Two right-sided chest tubes in place with nonradiopaque ports external to the thoracic cavity.  Remaining tubes and lines as described.   Original Report Authenticated By: Elberta Fortis, M.D.    Dg Chest Port 1 View  06/23/2012  *RADIOLOGY REPORT*  Clinical Data: Postoperative radiograph  PORTABLE CHEST - 1 VIEW  Comparison: 06/22/2012  Findings: Right pleural effusion is similar to slightly smaller. The associated consolidation is similar to prior.  Prominent cardiomediastinal contours, obscured on the the right by the above process. The mediastinal air fluid level is no longer visualized however a hiatal hernia may still be present.  Left lung remains  similar, with trace effusion or pleural thickening and mild associated airspace opacities superimposed on a background interstitial prominence.  Aortic arch atherosclerosis.  No definite pneumothorax.  No acute osseous finding. Left chest wall battery pack with incompletely imaged leads.  IMPRESSION:  Right pleural effusion with associated consolidation appears similar to slightly smaller.   Original Report Authenticated By: Jearld Lesch, M.D.    US Thoracentesis Asp Pleural Space W/img Guide  06/22/2012  *RADIOLOGY REPORT*  Clinical Data:  Shortness of breath, worsening right-sided pleural effusion  ULTRASOUND GUIDED right THORACENTESIS  Comparison:  None  An ultrasound guided thoracentesis was thoroughly discussed with the patient and questions answered.  The benefits, risks, alternatives and complications were also discussed.  The patient understands and wishes to proceed with the procedure.  Written consent was obtained.  Ultrasound was performed to localize and mark an adequate pocket of fluid in the right chest.  The effusion is noted to be densely loculated and the lung atelectatic.  A target zone of the effusion was selcted and the area was then prepped and draped in the normal sterile fashion.  1% Lidocaine was used for local anesthesia. Under ultrasound guidance a 19 gauge Yueh catheter was introduced. Thoracentesis was performed.  The catheter was removed and a dressing applied.  Complications:  None immediate  Findings: A total of approximately 45 ml of slightly hazy yellow fluid was removed. A fluid sample was sent for laboratory analysis.  IMPRESSION: 1. Ultrasound guided right thoracentesis yielding 45 ml of pleural fluid.  2. Given the innumerable septations within the complex right-sided pleural effusion, the patient may benefit from referral to thoracic surgery for consideration of surgical management of this complex pleural effusion.  Read by Brayton El PA-C   Original Report  Authenticated By: D. Andria Rhein, MD     Microbiology: Recent Results (from the past 240 hour(s))  BODY FLUID CULTURE     Status: Normal   Collection Time   06/22/12  4:19 PM      Component Value Range Status Comment   Specimen Description PLEURAL   Final    Special Requests NONE   Final    Gram Stain     Final    Value: RARE WBC PRESENT, PREDOMINANTLY PMN     NO ORGANISMS SEEN   Culture NO GROWTH 3 DAYS   Final    Report Status 06/25/2012 FINAL   Final   MRSA PCR SCREENING     Status: Normal  Collection Time   06/22/12  8:32 PM      Component Value Range Status Comment   MRSA by PCR NEGATIVE  NEGATIVE Final   URINE CULTURE     Status: Normal   Collection Time   06/23/12  3:30 PM      Component Value Range Status Comment   Specimen Description URINE, CLEAN CATCH   Final    Special Requests NONE   Final    Culture  Setup Time 06/23/2012 16:50   Final    Colony Count NO GROWTH   Final    Culture NO GROWTH   Final    Report Status 06/25/2012 FINAL   Final   CULTURE, RESPIRATORY     Status: Normal   Collection Time   06/23/12  5:35 PM      Component Value Range Status Comment   Specimen Description BRONCHIAL WASHINGS   Final    Special Requests NONE   Final    Gram Stain     Final    Value: FEW WBC PRESENT, PREDOMINANTLY PMN     RARE SQUAMOUS EPITHELIAL CELLS PRESENT     NO ORGANISMS SEEN   Culture Non-Pathogenic Oropharyngeal-type Flora Isolated.   Final    Report Status 06/26/2012 FINAL   Final   AFB CULTURE WITH SMEAR     Status: Normal (Preliminary result)   Collection Time   06/23/12  5:44 PM      Component Value Range Status Comment   Specimen Description PLEURAL FLUID RIGHT   Final    Special Requests PATIENT ON FOLLOWING UNASYN   Final    ACID FAST SMEAR NO ACID FAST BACILLI SEEN   Final    Culture     Final    Value: CULTURE WILL BE EXAMINED FOR 6 WEEKS BEFORE ISSUING A FINAL REPORT   Report Status PENDING   Incomplete   BODY FLUID CULTURE     Status: Normal    Collection Time   06/23/12  5:44 PM      Component Value Range Status Comment   Specimen Description PLEURAL FLUID RIGHT   Final    Special Requests PATIENT ON FOLLOWING UNASYN   Final    Gram Stain     Final    Value: NO WBC SEEN     NO ORGANISMS SEEN   Culture NO GROWTH 3 DAYS   Final    Report Status 06/27/2012 FINAL   Final   FUNGUS CULTURE W SMEAR     Status: Normal (Preliminary result)   Collection Time   06/23/12  5:44 PM      Component Value Range Status Comment   Specimen Description PLEURAL FLUID RIGHT   Final    Special Requests PATIENT ON FOLLOWING UNASYN   Final    Fungal Smear NO YEAST OR FUNGAL ELEMENTS SEEN   Final    Culture CULTURE IN PROGRESS FOR FOUR WEEKS   Final    Report Status PENDING   Incomplete      Labs: Basic Metabolic Panel:  Lab 06/28/12 4098 06/27/12 0455 06/26/12 0435 06/25/12 0401 06/24/12 0430  NA 141 142 141 144 141  K 3.7 3.6 3.7 3.3* 3.3*  CL 101 101 101 103 100  CO2 31 35* 34* 36* 34*  GLUCOSE 123* 113* 109* 145* 137*  BUN 9 9 13 12 16   CREATININE 0.41* 0.45* 0.42* 0.45* 0.47*  CALCIUM 9.3 9.1 9.0 9.1 9.1  MG -- -- -- -- --  PHOS -- -- -- -- --  Liver Function Tests:  Lab 06/25/12 0401 06/24/12 0430 06/23/12 1104 06/23/12 0840  AST 16 15 18 20   ALT 12 11 15 16   ALKPHOS 130* 116 129* 130*  BILITOT 0.4 0.5 0.4 0.5  PROT 5.5* 5.3* 6.2 6.5  ALBUMIN 2.0* 2.0* 2.3* 2.4*   No results found for this basename: LIPASE:5,AMYLASE:5 in the last 168 hours No results found for this basename: AMMONIA:5 in the last 168 hours CBC:  Lab 06/27/12 0455 06/26/12 0435 06/25/12 0401 06/24/12 0430 06/23/12 1104  WBC 10.2 10.1 12.3* 13.4* 12.1*  NEUTROABS -- -- -- 11.6* --  HGB 10.4* 10.1* 11.1* 10.7* 11.9*  HCT 32.6* 31.2* 33.8* 31.7* 34.8*  MCV 98.8 97.2 96.3 94.1 94.8  PLT 221 209 204 168 186   Cardiac Enzymes: No results found for this basename: CKTOTAL:5,CKMB:5,CKMBINDEX:5,TROPONINI:5 in the last 168 hours BNP: BNP (last 3  results)  Basename 06/21/12 1820  PROBNP 222.6   CBG:  Lab 06/26/12 2112 06/26/12 1141 06/26/12 0737 06/26/12 0353 06/25/12 2357  GLUCAP 104* 135* 111* 107* 99    Time coordinating discharge: 45 minutes  Signed:  Jemiah Ellenburg  [facility] 06/28/2012, 3:19 PM

## 2012-06-28 NOTE — Progress Notes (Signed)
SATURATION QUALIFICATIONS: (This note is used to comply with regulatory documentation for home oxygen)  Patient Saturations on Room Air at Rest = 90%  Patient Saturations on Room Air while Ambulating =78%  Patient Saturations on 3 Liters of oxygen while Ambulating =91%  Please briefly explain why patient needs home oxygen:

## 2012-06-28 NOTE — Clinical Social Work Note (Signed)
CSW following Pt for dc planning. Pt was in procedure yesterday and asleep during CSW visit later in the afternoon. CSW contacted [facility] re: Pt needing SNF at dc vs independent living and they are concern that their SNF beds are full. [facility] is checking their sister facility and CSW will contact area SNF's for availability after discussing issue with Pt.  CSW will update medical team as dc planning progresses.   Frederico Hamman, LCSW [phone]

## 2012-06-28 NOTE — Clinical Social Work Note (Signed)
CSW followed up with Pt re: recommendation for SNF. Pt declined SNF at [facility] or any outside facility at this time. Pt prefers to return home with daughter and husband providing assistance as needed. MD in room during conversation. CSW updated CM who plans to follow up with Pt to coordinate HH.   Frederico Hamman, LCSW [phone]

## 2012-06-28 NOTE — Progress Notes (Addendum)
                   [address]            [name] [address]          [phone]    1 Day Post-Op Procedure(s) (LRB): ESOPHAGOGASTRODUODENOSCOPY (EGD) WITH ESOPHAGEAL DILATION (N/A)  Subjective: Patient states breathing is slowly improving  Objective: Vital signs in last 24 hours: Temp:  [98.2 F (36.8 C)-98.6 F (37 C)] 98.2 F (36.8 C) (12/18 0517) Pulse Rate:  [91-94] 94  (12/18 0517) Cardiac Rhythm:  [-] Normal sinus rhythm;Sinus tachycardia (12/17 2045) Resp:  [7-29] 18  (12/18 0517) BP: (109-142)/(57-90) 126/57 mmHg (12/18 0517) SpO2:  [91 %-99 %] 96 % (12/18 0517)    Intake/Output from previous day: 12/17 0701 - 12/18 0700 In: 654 [I.V.:454; IV Piggyback:200] Out: 1075 [Urine:1075]   Physical Exam:  Cardiovascular: RRR Pulmonary: Diminished at bases R>L; no rales, wheezes, or rhonchi. Abdomen: Soft, non tender, bowel sounds present. Wounds: Clean and dry.  No erythema or signs of infection.   Lab Results: CBC: Basename 06/27/12 0455 06/26/12 0435  WBC 10.2 10.1  HGB 10.4* 10.1*  HCT 32.6* 31.2*  PLT 221 209   BMET:  Basename 06/28/12 0432 06/27/12 0455  NA 141 142  K 3.7 3.6  CL 101 101  CO2 31 35*  GLUCOSE 123* 113*  BUN 9 9  CREATININE 0.41* 0.45*  CALCIUM 9.3 9.1    PT/INR: No results found for this basename: LABPROT,INR in the last 72 hours ABG:  INR: Will add last result for INR, ABG once components are confirmed Will add last 4 CBG results once components are confirmed  Assessment/Plan:  1.  Pulmonary - surgically stable s/p bronchoscopy, right VATS, drainage of effusion, and decortication. Pathology findings were consistent with empyema (no tumor seen). On Unasyn for probable aspiration pneumonia. On 2 L via [place]-wean as tolerates. On CPAP at night for OSA 2.GI-s/p EGD.Has manometry study scheduled for 07/03/2012. 3.Managemnt per medicine et al.  Doree Fudge MPA-C 06/28/2012,9:14 AM   I have seen and  examined Melissa Noon and agree with the above assessment  and plan.  Delight Ovens MD Beeper [phone] Office [phone] 06/28/2012 11:44 AM

## 2012-06-29 ENCOUNTER — Encounter: Payer: Self-pay | Admitting: Gastroenterology

## 2012-07-03 DIAGNOSIS — K224 Dyskinesia of esophagus: Secondary | ICD-10-CM | POA: Diagnosis not present

## 2012-07-07 ENCOUNTER — Telehealth: Payer: Self-pay

## 2012-07-07 NOTE — Telephone Encounter (Signed)
I spoke with the patient's daughter this pm Mayette.  [facility] has not read the mano that was performed on 07/03/12.  The patient continues to loose weight.  She is on a full liquid diet, but feels weak and wants to try and eat.  She has a follow up scheduled for 07/25/11 with you.  Should I have her come in and see Amy Esterwood PA next week, the daughter is asking what the next step is if the mano confirms achalasia.  Dr. Russella Dar please advise.  (Mayette asks that I also call her on Monday with the plans in the pm [phone])

## 2012-07-09 NOTE — Telephone Encounter (Signed)
She can try eating. Pt has to decide where she wants to be followed.  Should she choose to stay here then we need the manometry report.

## 2012-07-09 NOTE — Telephone Encounter (Signed)
This is Dr. Marzetta Board patient. He did the initial consult during her recent hospital stay. She has also been followed by a gastroenterologist at [facility]. Please coordinate with Dr. Arlyce Dice.

## 2012-07-10 DIAGNOSIS — M6281 Muscle weakness (generalized): Secondary | ICD-10-CM | POA: Diagnosis not present

## 2012-07-10 DIAGNOSIS — R279 Unspecified lack of coordination: Secondary | ICD-10-CM | POA: Diagnosis not present

## 2012-07-10 DIAGNOSIS — R269 Unspecified abnormalities of gait and mobility: Secondary | ICD-10-CM | POA: Diagnosis not present

## 2012-07-10 NOTE — Telephone Encounter (Signed)
I spoke with [facility] this am, the report has not been read.  The patient saw Dr. Faye Ramsay last in September.  I spoke with the patient's Daughter [name] and she will have [name] call back.  They dont't want Jill to eat until they have the manometry report.  I have rescheduled the post hospital visit to Dr. Arlyce Dice on 07/17/12 10:15.  I will await a return call from the patient

## 2012-07-10 NOTE — Telephone Encounter (Signed)
Patient called back, she is advised to try to eat a soft diet and come for the appt on 07/17/12

## 2012-07-11 DIAGNOSIS — R269 Unspecified abnormalities of gait and mobility: Secondary | ICD-10-CM | POA: Diagnosis not present

## 2012-07-11 DIAGNOSIS — R279 Unspecified lack of coordination: Secondary | ICD-10-CM | POA: Diagnosis not present

## 2012-07-11 DIAGNOSIS — M6281 Muscle weakness (generalized): Secondary | ICD-10-CM | POA: Diagnosis not present

## 2012-07-13 DIAGNOSIS — M25569 Pain in unspecified knee: Secondary | ICD-10-CM | POA: Diagnosis not present

## 2012-07-13 DIAGNOSIS — R1312 Dysphagia, oropharyngeal phase: Secondary | ICD-10-CM | POA: Diagnosis not present

## 2012-07-14 ENCOUNTER — Encounter: Payer: Self-pay | Admitting: Cardiothoracic Surgery

## 2012-07-14 ENCOUNTER — Ambulatory Visit (INDEPENDENT_AMBULATORY_CARE_PROVIDER_SITE_OTHER): Payer: Medicare Other | Admitting: Cardiothoracic Surgery

## 2012-07-14 ENCOUNTER — Ambulatory Visit
Admission: RE | Admit: 2012-07-14 | Discharge: 2012-07-14 | Disposition: A | Discharge status: Home / Self Care | Payer: Medicare Other | Source: Ambulatory Visit | Attending: Cardiothoracic Surgery | Admitting: Cardiothoracic Surgery

## 2012-07-14 ENCOUNTER — Other Ambulatory Visit: Payer: Self-pay | Admitting: *Deleted

## 2012-07-14 ENCOUNTER — Telehealth: Payer: Self-pay

## 2012-07-14 VITALS — BP 134/76 | HR 120 | Temp 101.7°F | Resp 20 | Ht 62.0 in | Wt 156.0 lb

## 2012-07-14 DIAGNOSIS — Z09 Encounter for follow-up examination after completed treatment for conditions other than malignant neoplasm: Secondary | ICD-10-CM

## 2012-07-14 DIAGNOSIS — J90 Pleural effusion, not elsewhere classified: Secondary | ICD-10-CM | POA: Diagnosis not present

## 2012-07-14 DIAGNOSIS — R0602 Shortness of breath: Secondary | ICD-10-CM

## 2012-07-14 NOTE — Patient Instructions (Addendum)

## 2012-07-14 NOTE — Telephone Encounter (Signed)
Called, spoke with Dawn at Dr. Dennie Maizes office.  Pt was d/c'd from hospital on 06/28/12 and is s/p VATZ and pleural effusion.  Was seen by Dr. Tyrone Sage today for cont'd SOB.  Pt was seen by Pulm drs in hospital.  Dr. Tyrone Sage feels pt needs to be seen by pulm next wk for HFU.  Pt has cxr done today and results are in epic.  HFU scheduled with Dr. Frederico Hamman on Wed, Jan 8 at 2:15.  Pt will need to arrive at 2:00pm.  Dawn aware and will inform pt.

## 2012-07-16 MED ORDER — LEVOFLOXACIN 750 MG PO TABS: 750.0000 mg | ORAL_TABLET | Freq: Every day | ORAL | Status: DC

## 2012-07-16 NOTE — Progress Notes (Signed)
[address]            [place] [address]          [phone]       SHARLIZE HOAR [facility] Medical Record #[id number] Date of Birth: 1929/03/29  Cathren Harsh, MD [name], MD [facility] Dr Orene Desanctis GI  Chief Complaint:   PostOp Follow Up Visit:  06/22/2012  PREOPERATIVE DIAGNOSIS: Effusion after presentation with right lung  pneumonia.  POSTOPERATIVE DIAGNOSIS: Effusion after presentation with right lung  pneumonia.  SURGICAL PROCEDURE: Bronchoscopy, right video-assisted thoracoscopy  with drainage of complex pleural effusion and decortication.   History of Present Illness:     Patient returns for post op visit with recent hospitalization by [facility]. She was found to have mega esophagus and esophageal diverticulum with likely aspiration pneumonia and post pneumonia rt pleural effusion and early empyema. A VATS drainage of rt effusion was done. She notes over the past 24 hours mildly productive cough without hemoptysis or fever or chill.        History  Smoking status  . Never Smoker   Smokeless tobacco  . Never Used       Allergies  Allergen Reactions  . Actonel (Risedronate Sodium)   . Ivp Dye (Iodinated Diagnostic Agents) Nausea And Vomiting    Current Outpatient Prescriptions  Medication Sig Dispense Refill  . albuterol (PROVENTIL HFA;VENTOLIN HFA) 108 (90 BASE) MCG/ACT inhaler Inhale 2 puffs into the lungs every 6 (six) hours as needed for wheezing.  1 Inhaler  2  . b complex vitamins tablet Take 1 tablet by mouth daily.      . bisacodyl (DULCOLAX) 5 MG EC tablet Take 2 tablets (10 mg total) by mouth daily.  30 tablet    . docusate sodium 100 MG CAPS Take 100 mg by mouth 2 (two) times daily.  10 capsule    . hydrochlorothiazide (HYDRODIURIL) 25 MG tablet Take 25 mg by mouth daily.      Marland Kitchen ibuprofen (ADVIL,MOTRIN) 400 MG tablet Take 400 mg by mouth every 6 (six) hours as needed.        . metoprolol succinate (TOPROL-XL) 25 MG 24 hr tablet Take 25 mg by mouth daily. Verified that patient takes XL formulation by medication bottle.      Marland Kitchen omeprazole (PRILOSEC) 40 MG capsule Take 1 capsule (40 mg total) by mouth daily.  30 capsule  0  . predniSONE (DELTASONE) 5 MG tablet Take 4 mg by mouth daily.       . vitamin C (ASCORBIC ACID) 500 MG tablet Take 500 mg by mouth daily.      . Vitamins A & D (VITAMIN A & D PO) Take 1 capsule by mouth daily.      Marland Kitchen levofloxacin (LEVAQUIN) 750 MG tablet Take 1 tablet (750 mg total) by mouth daily.  14 tablet  0       Physical Exam: BP 134/76  Pulse 120  Temp 101.7 F (38.7 C) (Oral)  Resp 20  Ht 5\' 2"  (1.575 m)  Wt 156 lb (70.761 kg)  BMI 28.53 kg/m2  SpO2 92%  General appearance: alert, cooperative, appears stated age and no distress Neurologic: intact Heart: regular rate and rhythm, S1, S2 normal, no murmur, click, rub or gallop and normal apical impulse Lungs: diminished breath sounds RLL Abdomen: soft, non-tender; bowel sounds  normal; no masses,  no organomegaly Extremities: extremities normal, atraumatic, no cyanosis or edema and Homans sign is negative, no sign of DVT Wound: chest tube and vats site healing well with out infection Wounds:  Diagnostic Studies & Laboratory data:         Recent Radiology Findings: Dg Chest 2 View  07/14/2012  *RADIOLOGY REPORT*  Clinical Data: Status post VATS surgery on the right  CHEST - 2 VIEW  Comparison: 06/27/2012  Findings: Pacing device is again seen.  Foodstuffs are again noted within the dilated esophagus.  Cardiac shadow is stable. Persistent basilar changes are noted on the right although slightly improved when compared with the prior exam. A few loculations of air are noted overlying the effusion likely within a residual from the recent procedure.  No pneumothorax is noted.  IMPRESSION: Residual changes on the right although the effusion has improved somewhat.   Original Report  Authenticated By: Alcide Clever, M.D.       Recent Labs: Lab Results  Component Value Date   WBC 10.2 06/27/2012   HGB 10.4* 06/27/2012   HCT 32.6* 06/27/2012   PLT 221 06/27/2012   GLUCOSE 123* 06/28/2012   ALT 12 06/25/2012   AST 16 06/25/2012   NA 141 06/28/2012   K 3.7 06/28/2012   CL 101 06/28/2012   CREATININE 0.41* 06/28/2012   BUN 9 06/28/2012   CO2 31 06/28/2012   TSH 0.451 06/27/2012   INR 1.20 06/23/2012      Assessment / Plan:    With cough and fever today concerned for recurrent pneumonia in spite of improved Chest xray today, patient did not wish to consider readmission at this time I have stared her on Levaquin. Will arrange for follow up appointment early next week with [facility]. Will will call if symptoms or fever worseness        Ammi Hutt B 07/16/2012 1:58 PM

## 2012-07-17 ENCOUNTER — Encounter: Payer: Self-pay | Admitting: Gastroenterology

## 2012-07-17 ENCOUNTER — Ambulatory Visit (INDEPENDENT_AMBULATORY_CARE_PROVIDER_SITE_OTHER): Payer: Medicare Other | Admitting: Gastroenterology

## 2012-07-17 VITALS — BP 120/60 | HR 92 | Ht 62.0 in | Wt 156.4 lb

## 2012-07-17 DIAGNOSIS — R1319 Other dysphagia: Secondary | ICD-10-CM

## 2012-07-17 DIAGNOSIS — M25569 Pain in unspecified knee: Secondary | ICD-10-CM | POA: Diagnosis not present

## 2012-07-17 DIAGNOSIS — R1312 Dysphagia, oropharyngeal phase: Secondary | ICD-10-CM | POA: Diagnosis not present

## 2012-07-17 MED ORDER — OMEPRAZOLE 40 MG PO CPDR: 40.0000 mg | DELAYED_RELEASE_CAPSULE | Freq: Every day | ORAL | Status: DC

## 2012-07-17 NOTE — Progress Notes (Signed)
History of Present Illness:  79-year-old white female seen last month during hospitalization for pneumonia. A megaesophagus was noted by CT scan. Upper endoscopy confirmed this. There was mild resistance to the passage of the endoscope to the GE junction.  Patient appears to be aspirating and has continued dysphagia to solids and liquids. Esophageal manometry at [facility] demonstrated a mean LES pressure of 8.7 with normal relaxation with swallowing. The esophagus was noted to be aperistaltic.  He is drinking liquids supplements only. She remains on antibiotics for pneumonia.    Past Medical History  Diagnosis Date  . Arthritis   . GERD (gastroesophageal reflux disease)   . Cataract   . Blood transfusion without reported diagnosis    Past Surgical History  Procedure Date  . Appendectomy   . Joint replacement   . Video bronchoscopy 06/23/2012    Procedure: VIDEO BRONCHOSCOPY;  Surgeon: Edward B Gerhardt, MD;  Location: [facility] OR;  Service: Thoracic;  Laterality: N/A;  . Video assisted thoracoscopy (vats)/decortication 06/23/2012    Procedure: VIDEO ASSISTED THORACOSCOPY (VATS)/DECORTICATION;  Surgeon: Edward B Gerhardt, MD;  Location: [facility] OR;  Service: Thoracic;  Laterality: Right;  . Esophagogastroduodenoscopy (egd) with esophageal dilation 06/27/2012    Procedure: ESOPHAGOGASTRODUODENOSCOPY (EGD) WITH ESOPHAGEAL DILATION;  Surgeon: Malcolm T Stark, MD,FACG;  Location: [facility] ENDOSCOPY;  Service: Endoscopy;  Laterality: N/A;   family history includes Arthritis in her father; Cancer in her brother; Diabetes in her son; and Heart disease in her brother and father. Current Outpatient Prescriptions  Medication Sig Dispense Refill  . albuterol (PROVENTIL HFA;VENTOLIN HFA) 108 (90 BASE) MCG/ACT inhaler Inhale 2 puffs into the lungs every 6 (six) hours as needed for wheezing.  1 Inhaler  2  . b complex vitamins tablet Take 1 tablet by mouth daily.      . bisacodyl (DULCOLAX) 5 MG EC tablet Take 2 tablets (10  mg total) by mouth daily.  30 tablet    . docusate sodium 100 MG CAPS Take 100 mg by mouth 2 (two) times daily.  10 capsule    . hydrochlorothiazide (HYDRODIURIL) 25 MG tablet Take 25 mg by mouth daily.      . ibuprofen (ADVIL,MOTRIN) 400 MG tablet Take 400 mg by mouth every 6 (six) hours as needed.      . levofloxacin (LEVAQUIN) 750 MG tablet Take 1 tablet (750 mg total) by mouth daily.  14 tablet  0  . metoprolol succinate (TOPROL-XL) 25 MG 24 hr tablet Take 25 mg by mouth daily. Verified that patient takes XL formulation by medication bottle.      . omeprazole (PRILOSEC) 40 MG capsule Take 1 capsule (40 mg total) by mouth daily.  30 capsule  0  . predniSONE (DELTASONE) 5 MG tablet Take 4 mg by mouth daily.       . vitamin C (ASCORBIC ACID) 500 MG tablet Take 500 mg by mouth daily.      . Vitamins A & D (VITAMIN A & D PO) Take 1 capsule by mouth daily.       Allergies as of 07/17/2012 - Review Complete 07/17/2012  Allergen Reaction Noted  . Actonel (risedronate sodium)  06/19/2012  . Ivp dye (iodinated diagnostic agents) Nausea And Vomiting 06/19/2012    reports that she has never smoked. She has never used smokeless tobacco. She reports that she drinks alcohol. She reports that she does not use illicit drugs.     Review of Systems: She complains of a persistent inability to take a full   breath. She's been having some low-grade fevers at night. Pertinent positive and negative review of systems were noted in the above HPI section. All other review of systems were otherwise negative.  Vital signs were reviewed in today's medical record Physical Exam: General: Well developed , well nourished, no acute distress Head: Normocephalic and atraumatic Eyes:  sclerae anicteric, EOMI Ears: Normal auditory acuity Mouth: No deformity or lesions Neck: Supple, no masses or thyromegaly Lungs: There are a few right basilar posterior rales. She has decreased breath sounds on the right Heart: Regular  rate and rhythm; no murmurs, rubs or bruits Abdomen: Soft, non tender and non distended. No masses, hepatosplenomegaly or hernias noted. Normal Bowel sounds Rectal:deferred Musculoskeletal: Symmetrical with no gross deformities  Skin: No lesions on visible extremities Pulses:  Normal pulses noted Extremities: No clubbing, cyanosis, edema or deformities noted Neurological: Alert oriented x 4, grossly nonfocal Cervical Nodes:  No significant cervical adenopathy Inguinal Nodes: No significant inguinal adenopathy Psychological:  Alert and cooperative. Normal mood and affect       

## 2012-07-17 NOTE — Patient Instructions (Addendum)
You have been scheduled for an endoscopy at [facility] .Please follow written instructions given to you at your visit today. If you use inhalers (even only as needed) or a CPAP machine, please bring them with you on the day of your procedure.

## 2012-07-17 NOTE — Assessment & Plan Note (Addendum)
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 LES relaxation is normal by manometry thereby ruling out achalasia. She may have some stenosis or stricture that is contributing to dysphagia.  Recommendations #1 upper endoscopy with balloon dilatation  Risks, alternatives, and complications of the procedure, including bleeding, perforation, and possible need for surgery, were explained to the patient.  Patient's questions were answered.

## 2012-07-18 ENCOUNTER — Other Ambulatory Visit: Payer: Self-pay | Admitting: *Deleted

## 2012-07-18 DIAGNOSIS — R1312 Dysphagia, oropharyngeal phase: Secondary | ICD-10-CM | POA: Diagnosis not present

## 2012-07-18 DIAGNOSIS — M25569 Pain in unspecified knee: Secondary | ICD-10-CM | POA: Diagnosis not present

## 2012-07-18 DIAGNOSIS — J90 Pleural effusion, not elsewhere classified: Secondary | ICD-10-CM

## 2012-07-19 ENCOUNTER — Ambulatory Visit (INDEPENDENT_AMBULATORY_CARE_PROVIDER_SITE_OTHER): Payer: Medicare Other | Admitting: Pulmonary Disease

## 2012-07-19 ENCOUNTER — Ambulatory Visit (INDEPENDENT_AMBULATORY_CARE_PROVIDER_SITE_OTHER)
Admission: RE | Admit: 2012-07-19 | Discharge: 2012-07-19 | Disposition: A | Discharge status: Home / Self Care | Payer: Medicare Other | Source: Ambulatory Visit | Attending: Pulmonary Disease | Admitting: Pulmonary Disease

## 2012-07-19 ENCOUNTER — Encounter: Payer: Self-pay | Admitting: Pulmonary Disease

## 2012-07-19 ENCOUNTER — Other Ambulatory Visit (INDEPENDENT_AMBULATORY_CARE_PROVIDER_SITE_OTHER): Payer: Medicare Other

## 2012-07-19 VITALS — BP 120/64 | HR 109 | Temp 99.4°F | Ht 62.0 in | Wt 156.4 lb

## 2012-07-19 DIAGNOSIS — J869 Pyothorax without fistula: Secondary | ICD-10-CM

## 2012-07-19 DIAGNOSIS — R042 Hemoptysis: Secondary | ICD-10-CM | POA: Diagnosis not present

## 2012-07-19 DIAGNOSIS — M25569 Pain in unspecified knee: Secondary | ICD-10-CM | POA: Diagnosis not present

## 2012-07-19 DIAGNOSIS — E042 Nontoxic multinodular goiter: Secondary | ICD-10-CM

## 2012-07-19 DIAGNOSIS — J90 Pleural effusion, not elsewhere classified: Secondary | ICD-10-CM | POA: Diagnosis not present

## 2012-07-19 DIAGNOSIS — R0609 Other forms of dyspnea: Secondary | ICD-10-CM

## 2012-07-19 DIAGNOSIS — J690 Pneumonitis due to inhalation of food and vomit: Secondary | ICD-10-CM

## 2012-07-19 DIAGNOSIS — J189 Pneumonia, unspecified organism: Secondary | ICD-10-CM | POA: Diagnosis not present

## 2012-07-19 DIAGNOSIS — R1312 Dysphagia, oropharyngeal phase: Secondary | ICD-10-CM | POA: Diagnosis not present

## 2012-07-19 DIAGNOSIS — R0600 Dyspnea, unspecified: Secondary | ICD-10-CM

## 2012-07-19 DIAGNOSIS — R609 Edema, unspecified: Secondary | ICD-10-CM | POA: Diagnosis not present

## 2012-07-19 DIAGNOSIS — R0989 Other specified symptoms and signs involving the circulatory and respiratory systems: Secondary | ICD-10-CM | POA: Diagnosis not present

## 2012-07-19 DIAGNOSIS — R1319 Other dysphagia: Secondary | ICD-10-CM

## 2012-07-19 DIAGNOSIS — R600 Localized edema: Secondary | ICD-10-CM

## 2012-07-19 LAB — BASIC METABOLIC PANEL
CO2: 36 mEq/L — ABNORMAL HIGH (ref 19–32)
Calcium: 9.8 mg/dL (ref 8.4–10.5)
Creatinine, Ser: 0.6 mg/dL (ref 0.4–1.2)
Glucose, Bld: 137 mg/dL — ABNORMAL HIGH (ref 70–99)

## 2012-07-19 LAB — TSH: TSH: 0.37 u[IU]/mL (ref 0.35–5.50)

## 2012-07-19 LAB — CBC
HCT: 33.5 % — ABNORMAL LOW (ref 36.0–46.0)
Hemoglobin: 11.2 g/dL — ABNORMAL LOW (ref 12.0–15.0)
MCHC: 33.5 g/dL (ref 30.0–36.0)
RDW: 14.9 % — ABNORMAL HIGH (ref 11.5–14.6)
WBC: 14.5 10*3/uL — ABNORMAL HIGH (ref 4.5–10.5)

## 2012-07-19 LAB — PROTIME-INR: Prothrombin Time: 14.8 s — ABNORMAL HIGH (ref 10.2–12.4)

## 2012-07-19 MED ORDER — PREDNISONE 50 MG PO TABS: ORAL_TABLET | ORAL | Status: DC

## 2012-07-19 MED ORDER — AMOXICILLIN-POT CLAVULANATE 875-125 MG PO TABS: 1.0000 | ORAL_TABLET | Freq: Two times a day (BID) | ORAL | Status: DC

## 2012-07-19 MED ORDER — LEVOFLOXACIN 500 MG PO TABS: 500.0000 mg | ORAL_TABLET | Freq: Every day | ORAL | Status: DC

## 2012-07-19 NOTE — Progress Notes (Signed)
Reason for visit: hospital follow up on RLL PNA and empyema    79 y.o. female with past medical history polymyalgia rheumatica on chronic steroids as well as pacemaker who presents to clinic for hospital follow up on RLL PNA and empyema .  [name] was diagnosed with aspiration pneumonia secondary to esophageal dysmotility on 06/19/2012 and underwent VATS for empyema on 06/23/2012. Thoracentesis on 12/12 demonstrated an exudutive effusion/empyema and she underwent intubation and VATS procedure on 12/13. She required intubation and mechanical ventilation for 1 day and was extubated on 12/14. She had a chest tube in place from 12/13 to 12/16 which was removed without complication.   She received antibiotics for 10 days, 7 days IV while hospitalized and 3 days oral. She tolerated the antibiotics well with no side effects.   At discharge she required oxygen via nasal canula, 3L with rest and exertion due to respiratory insufficiency. During exertion, she desaturates to high 70s via pulse oximetry.  Cultures from her thoracentesis and her VATs remain negative to date for bacteria, AFB, fungi and yeast.   Today she comes in complaining of generalized weakness, dyspnea at rest but predominantly with exertion, cough productive of green sputum and hemoptysis (streaks associated with forceful cough). She denies fever, admits persistent RLE edema. She was seen in Dr. Dennie Maizes office 07/16/2012 and due to the above symptoms and fever, Levaquin was initiated. CXR performed at that time showed persistent pleural effusion with air fluid levels and RLL PNA. States that at home PT told her she would no longer require oxygen supplementation.   Manometry 12/13 showed no achalasia but esophageal stricture    Allergies  Allergen Reactions  . Actonel (Risedronate Sodium)   . Ivp Dye (Iodinated Diagnostic Agents) Nausea And Vomiting   Past Medical History  Diagnosis Date  . Arthritis   . GERD (gastroesophageal  reflux disease)   . Cataract   . Blood transfusion without reported diagnosis   . PNA (pneumonia)   . Empyema, right     VATS 06/23/2012 cultures negative to date CXR 07/19/12 persistent airfluid levels  . Polymyalgia rheumatica     on chronic Prednisone 5mg  daily   Past Surgical History  Procedure Date  . Appendectomy   . Joint replacement   . Video bronchoscopy 06/23/2012    Procedure: VIDEO BRONCHOSCOPY;  Surgeon: Delight Ovens, MD;  Location: [facility] OR;  Service: Thoracic;  Laterality: N/A;  . Video assisted thoracoscopy (vats)/decortication 06/23/2012    Procedure: VIDEO ASSISTED THORACOSCOPY (VATS)/DECORTICATION;  Surgeon: Delight Ovens, MD;  Location: [facility] OR;  Service: Thoracic;  Laterality: Right;  . Esophagogastroduodenoscopy (egd) with esophageal dilation 06/27/2012    Procedure: ESOPHAGOGASTRODUODENOSCOPY (EGD) WITH ESOPHAGEAL DILATION;  Surgeon: Meryl Dare, MD,FACG;  Location: [facility] ENDOSCOPY;  Service: Endoscopy;  Laterality: N/A;  . Video assisted thoracoscopy (vats)/empyema     06/23/2012   Family History  Problem Relation Age of Onset  . Arthritis Father   . Heart disease Father   . Cancer Brother   . Heart disease Brother   . Diabetes Son    Current Outpatient Prescriptions  Medication Sig Dispense Refill  . albuterol (PROVENTIL HFA;VENTOLIN HFA) 108 (90 BASE) MCG/ACT inhaler Inhale 2 puffs into the lungs every 6 (six) hours as needed for wheezing.  1 Inhaler  2  . b complex vitamins tablet Take 1 tablet by mouth daily.      . bisacodyl (DULCOLAX) 5 MG EC tablet Take 2 tablets (10 mg total) by mouth daily.  30 tablet    . docusate sodium 100 MG CAPS Take 100 mg by mouth 2 (two) times daily.  10 capsule    . hydrochlorothiazide (HYDRODIURIL) 25 MG tablet Take 25 mg by mouth daily.      Marland Kitchen ibuprofen (ADVIL,MOTRIN) 400 MG tablet Take 400 mg by mouth every 6 (six) hours as needed.      Marland Kitchen levofloxacin (LEVAQUIN) 750 MG tablet Take 1 tablet (750 mg total) by mouth  daily.  14 tablet  0  . metoprolol succinate (TOPROL-XL) 25 MG 24 hr tablet Take 25 mg by mouth daily. Verified that patient takes XL formulation by medication bottle.      Marland Kitchen omeprazole (PRILOSEC) 40 MG capsule Take 1 capsule (40 mg total) by mouth daily.  30 capsule  0  . predniSONE (DELTASONE) 5 MG tablet Take 4 mg by mouth daily.       . vitamin C (ASCORBIC ACID) 500 MG tablet Take 500 mg by mouth daily.      . Vitamins A & D (VITAMIN A & D PO) Take 1 capsule by mouth daily.      Marland Kitchen amoxicillin-clavulanate (AUGMENTIN) 875-125 MG per tablet Take 1 tablet by mouth 2 (two) times daily.  14 tablet  0  . levofloxacin (LEVAQUIN) 500 MG tablet Take 1 tablet (500 mg total) by mouth daily.  14 tablet  3  . predniSONE (DELTASONE) 50 MG tablet Take as instructed prior to CT scan  3 tablet  0   Review of Systems  Constitutional: Negativee for appetite change and unexpected weight change. Negative for fever.  HENT: Negative for ear pain, congestion, sore throat, sneezing and trouble swallowing. Negative for dental problem.  Positive for swallow difficulties.  Respiratory:Per HPI  Cardiovascular: Per HPI. Negative for palpitations.  Gastrointestinal: Negative for abdominal pain.  Musculoskeletal: Negative for joint swelling.  Skin: Negative for rash.  Neurological: Negative for numbness.  Psychiatric/Behavioral: Positive for insomnia   BP 120/64  Pulse 109  Temp 99.4 F (37.4 C) (Oral)  Ht 5\' 2"  (1.575 m)  Wt 156 lb 6.4 oz (70.943 kg)  BMI 28.61 kg/m2  SpO2 92% General: Comfortable. Obese HEENT ; pupils round and reactive to light; mild nasal mucosa edema and erythema; retropharyngeal crowding with no erythema, edema or exudate; no cervical LAD  Cardiovascular: s1s2, no murmurs. Regular rate and rhythm.  Respiratory: Decreased breath sounds bilaterally, R>L, R crackles No increased work of breathing.  Abdomen: soft NT ND BS+.   Extremities: RLE calf diameter>L. Homans negative;  Central  nervous system: Alert and oriented No focal neurological deficits.     Assessment and plan:  1. RLL PNA and R empyema most likely with recurrent aspiration due to intra esophageal reflux in context of LES stenosis and esophageal dilation.   Continue Levaquin,  add Augmentin for better anaerobic coverage. To continue antibiotics for several weeks/until CXR changes resolve. I expect she will always have a component of trapped lung on the R.   CT chest to be performed today for further evaluation of parenchymal and  Pleural space disease. However I would not recommend repeat VATS. Risk might outweight the benefit. If CT reveals a pocket of fluid that would be amenable to thoracentesis, I will refer the patient for diagnostic IR CT guided thoracentesis.   2. LES stenosis; it is of outmost importance to undergo dilation in order to minimize intra esophageal reflux and aspiration. She is scheduled for this procedure with Dr. Arlyce Dice on Monday.  3. Dyspnea, hemoptysis and hypoxia;  most likely secondary to no 1. However the patient is relatively inactive; exam shows larger R calf diameter. The patient has h/o variceal disease on the R. CTA and LE dopplers to be performed to further evaluate for DVT/PE.   While walking oxygen saturation dropped to 89 however the patient unable to walk more than 1 lap and needed to rest frequently. She will continue oxygen while ambulating. She has oxygen at home.   4. Follow up in clinic in 2 weeks; At that time we will repeat CXR. In the interim I will review the results of the CTA and act as necessary. She also has an appointment scheduled with Dr. Tyrone Sage for tomorrow. She was advised to call with any questions or changes in health status; if recurrent fever, she was advised to call the office; we will consider admission for IV antibiotics.     Jill Mulders, MD  [facility]  [place], [place]

## 2012-07-19 NOTE — Patient Instructions (Addendum)
We will obtain a CT chest with IV contrast to evaluate in detail the lung and the pleural space, also to check for blood clots that could contribute to your shortness of breath. We will also obtain  LE dopplers to evaluate for blood clots.    Please start Augmentin in addition to Levaquin for two weeks.   Please use oxygen at 1L with walking. Continue physical therapy.  We will see you in 2 weeks; at that time we will repeat a CXR.

## 2012-07-20 ENCOUNTER — Encounter: Payer: Self-pay | Admitting: Cardiothoracic Surgery

## 2012-07-20 ENCOUNTER — Ambulatory Visit
Admission: RE | Admit: 2012-07-20 | Discharge: 2012-07-20 | Disposition: A | Discharge status: Home / Self Care | Payer: Medicare Other | Source: Ambulatory Visit | Attending: Pulmonary Disease | Admitting: Pulmonary Disease

## 2012-07-20 ENCOUNTER — Ambulatory Visit (INDEPENDENT_AMBULATORY_CARE_PROVIDER_SITE_OTHER): Payer: Self-pay | Admitting: Cardiothoracic Surgery

## 2012-07-20 ENCOUNTER — Ambulatory Visit
Admission: RE | Admit: 2012-07-20 | Discharge: 2012-07-20 | Disposition: A | Discharge status: Home / Self Care | Payer: Medicare Other | Source: Ambulatory Visit | Attending: Cardiothoracic Surgery | Admitting: Cardiothoracic Surgery

## 2012-07-20 ENCOUNTER — Other Ambulatory Visit: Payer: Self-pay | Admitting: Pulmonary Disease

## 2012-07-20 VITALS — BP 131/75 | HR 120 | Temp 96.7°F | Resp 20 | Ht 62.0 in | Wt 156.0 lb

## 2012-07-20 DIAGNOSIS — J90 Pleural effusion, not elsewhere classified: Secondary | ICD-10-CM

## 2012-07-20 DIAGNOSIS — J984 Other disorders of lung: Secondary | ICD-10-CM | POA: Diagnosis not present

## 2012-07-20 DIAGNOSIS — R0600 Dyspnea, unspecified: Secondary | ICD-10-CM

## 2012-07-20 DIAGNOSIS — R600 Localized edema: Secondary | ICD-10-CM

## 2012-07-20 DIAGNOSIS — R000 Tachycardia, unspecified: Secondary | ICD-10-CM

## 2012-07-20 DIAGNOSIS — I319 Disease of pericardium, unspecified: Secondary | ICD-10-CM | POA: Diagnosis not present

## 2012-07-20 DIAGNOSIS — R042 Hemoptysis: Secondary | ICD-10-CM

## 2012-07-20 DIAGNOSIS — R0602 Shortness of breath: Secondary | ICD-10-CM | POA: Diagnosis not present

## 2012-07-20 DIAGNOSIS — J869 Pyothorax without fistula: Secondary | ICD-10-CM

## 2012-07-20 DIAGNOSIS — Z09 Encounter for follow-up examination after completed treatment for conditions other than malignant neoplasm: Secondary | ICD-10-CM

## 2012-07-20 MED ORDER — IOHEXOL 350 MG/ML SOLN
125.0000 mL | Freq: Once | INTRAVENOUS | Status: AC | PRN
  Administered 2012-07-20: 125 mL via INTRAVENOUS

## 2012-07-20 NOTE — Progress Notes (Signed)
[address]            [place] [address]          [phone]       [name] [facility] Medical Record #[id number] Date of Birth: 06-14-1929  Jill Relic, MD Jill Relic, MD [facility] Dr [name] [facility]  Chief Complaint:   PostOp Follow Up Visit:  06/22/2012  PREOPERATIVE DIAGNOSIS: Effusion after presentation with right lung  pneumonia.  POSTOPERATIVE DIAGNOSIS: Effusion after presentation with right lung  pneumonia.  SURGICAL PROCEDURE: Bronchoscopy, right video-assisted thoracoscopy  with drainage of complex pleural effusion and decortication.   History of Present Illness:     Patient returns for post op visit with recent hospitalization by [facility]. She was found to have mega esophagus and esophageal diverticulum with likely aspiration pneumonia and post pneumonia rt pleural effusion and early empyema. A VATS drainage of rt effusion was done. She was seen last week in the office with increasing fevers. PICC line being readmitted she was started on Levaquin which is subsequently unchanged to Levaquin and Augmentin by the pulmonary service. Since seeing last week she still has some cough has had no further fevers and overall feels better. She's been seen by GI soft she'll dilatation is planned for next week. A CAT scan of the chest ordered by pulmonary service was performed this morning and reviewed.      History  Smoking status  . Never Smoker   Smokeless tobacco  . Never Used       Allergies  Allergen Reactions  . Actonel (Risedronate Sodium)   . Ivp Dye (Iodinated Diagnostic Agents) Nausea And Vomiting    Current Outpatient Prescriptions  Medication Sig Dispense Refill  . albuterol (PROVENTIL HFA;VENTOLIN HFA) 108 (90 BASE) MCG/ACT inhaler Inhale 2 puffs into the lungs every 6 (six) hours as needed for wheezing.  1 Inhaler  2  . amoxicillin-clavulanate (AUGMENTIN) 875-125 MG per tablet  Take 1 tablet by mouth 2 (two) times daily.  14 tablet  0  . b complex vitamins tablet Take 1 tablet by mouth daily.      . bisacodyl (DULCOLAX) 5 MG EC tablet Take 2 tablets (10 mg total) by mouth daily.  30 tablet    . docusate sodium 100 MG CAPS Take 100 mg by mouth 2 (two) times daily.  10 capsule    . hydrochlorothiazide (HYDRODIURIL) 25 MG tablet Take 25 mg by mouth daily.      Marland Kitchen ibuprofen (ADVIL,MOTRIN) 400 MG tablet Take 400 mg by mouth every 6 (six) hours as needed.      Marland Kitchen levofloxacin (LEVAQUIN) 500 MG tablet Take 1 tablet (500 mg total) by mouth daily.  14 tablet  3  . levofloxacin (LEVAQUIN) 750 MG tablet Take 1 tablet (750 mg total) by mouth daily.  14 tablet  0  . metoprolol succinate (TOPROL-XL) 25 MG 24 hr tablet Take 25 mg by mouth daily. Verified that patient takes XL formulation by medication bottle.      Marland Kitchen omeprazole (PRILOSEC) 40 MG capsule Take 1 capsule (40 mg total) by mouth daily.  30 capsule  0  . predniSONE (DELTASONE) 5 MG tablet Take 4 mg by mouth daily.       . predniSONE (DELTASONE) 50 MG tablet Take as instructed prior to CT scan  3 tablet  0  .  vitamin C (ASCORBIC ACID) 500 MG tablet Take 500 mg by mouth daily.      . Vitamins A & D (VITAMIN A & D PO) Take 1 capsule by mouth daily.           Physical Exam: Ht 5\' 2"  (1.575 m)  Wt 156 lb (70.761 kg)  BMI 28.53 kg/m2  General appearance: alert, cooperative, appears stated age and no distress Neurologic: intact Heart: regular rate and rhythm, S1, S2 normal, no murmur, click, rub or gallop and normal apical impulse Lungs: diminished breath sounds RLL Abdomen: soft, non-tender; bowel sounds normal; no masses,  no organomegaly Extremities: extremities normal, atraumatic, no cyanosis or edema and Homans sign is negative, no sign of DVT Wound: chest tube and vats site healing well with out infection   Diagnostic Studies & Laboratory data:         Recent Radiology Findings: Dg Chest 2 View  07/20/2012   *RADIOLOGY REPORT*  Clinical Data: Post right VATS, follow-up, some shortness of breath  CHEST - 2 VIEW  Comparison: Chest x-ray of 07/19/2012  Findings: Opacity and air fluid levels at the right lung base persist and may represent loculated right basilar hydropneumothorax.  Air fluid levels overlying the mediastinum also are noted and could be due to dilated esophagus and distal esophageal stricture or achalasia as noted previously.  The left lung remains clear.  Cardiomegaly is stable.  Permanent pacemaker is unchanged.  The bones are osteopenic.  IMPRESSION:  1.  No significant change in pleural and parenchymal opacity at the right lung base with air-fluid levels possibly indicating right basilar hydropneumothorax. 2.  Air-fluid levels overlying the mediastinum may be due to a dilated esophagus.   Original Report Authenticated By: [name], M.D.    Dg Chest 2 View  07/19/2012  *RADIOLOGY REPORT*  Clinical Data: Follow up aspiration pneumonia and empyema  CHEST - 2 VIEW  Comparison: Chest x-ray 07/14/2012, CT chest 06/19/2012  Findings: Markedly dilated esophagus containing retained food. This may be due to esophageal stricture or achalasia.  Small right pleural effusion containing air fluid levels is similar to the prior study.  This may be due to empyema.  The air fluid level is probably above the diaphragm consistent with hydropneumothorax   however it is difficult to accurately determine exact location of the diaphragm.  Right lower lobe infiltrate is unchanged and may be due to atelectasis or persistent pneumonia.  Left lung is clear.  No heart failure.  IMPRESSION: Persistent right lower lobe infiltrate.  This may represent pneumonia.  Right pleural effusion and loculated hydropneumothorax in the right lung base is unchanged.  This is presumably related to empyema and prior VATS.   Original Report Authenticated By: [name], M.D.    Ct Angio Chest Pe W/cm &/or Wo Cm  07/20/2012  *RADIOLOGY REPORT*   Clinical Data: Shortness of breath and tachycardia  CT ANGIOGRAPHY CHEST  Technique:  Multidetector CT imaging of the chest using the standard protocol during bolus administration of intravenous contrast. Multiplanar reconstructed images including MIPs were obtained and reviewed to evaluate the vascular anatomy.  Contrast: OMNIPAQUE IOHEXOL 350 MG/ML SOLN  Comparison: Chest CT June 19, 2012; chest radiograph July 20, 2012  Findings: There is no demonstrable pulmonary embolus.  There is no thoracic aortic aneurysm or dissection.  Marked hiatal hernia with diffuse esophageal distension remains. It is esophagus is filled with food material, a finding noted previously.  At this time, the maximum transverse diameter the  upper esophagus is 7.4 cm compared to 6.0 cm on the prior study.  There is a moderate pleural effusion on the right which does contain foci of air. The air within the effusion is a new finding. There is consolidation in the right lower lobe.  There is no pneumothorax.  Left lung remains clear except for minimal left base atelectasis.  There is mild lower lobe bronchiectatic change bilaterally.  There is no appreciable thoracic adenopathy. There is a small pericardial effusion.  New para  There is a pacemaker with leads attached to the right heart, stable.  Thyroid remains enlarged with multinodular goiter, stable.  In the visualized upper abdomen, there is an apparent hemangioma in the anterior spleen measuring 1.5 x 1.5 cm.  There is degenerative change in the thoracic spine.  There are no blastic or lytic bone lesions.  IMPRESSION: There is no pneumothorax.  However, there is a loculated effusion in the right base which contains air.  The possibility of bronchopleural fistula must be of concern.  Infection in this fluid in the right base also is possible. There is not appear to be a well-formed abscess.  There is persistent right lower lobe consolidation.  Large hiatal hernia.  Marked  distension of the esophagus, increased from prior study.  Esophagus filled with food material.  No demonstrable pulmonary embolus.  There is a small pericardial effusion.  Multinodular goiter. Diffuse thyroid enlargement, stable.  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   Original Report Authenticated By: Bretta Bang, M.D.       Recent Labs: Lab Results  Component Value Date   WBC 14.5* 07/19/2012   HGB 11.2* 07/19/2012   HCT 33.5* 07/19/2012   PLT 318.0 07/19/2012   GLUCOSE 137* 07/19/2012   ALT 12 06/25/2012   AST 16 06/25/2012   NA 135 07/19/2012   K 3.8 07/19/2012   CL 93* 07/19/2012   CREATININE 0.6 07/19/2012   BUN 12 07/19/2012   CO2 36* 07/19/2012   TSH 0.37 07/19/2012   INR 1.4* 07/19/2012      Assessment / Plan:     It appears the patient is continuing to have respiratory difficulty related to presumed aspiration. She still has consolidation in her right lower lobe with some pleural fluid with air. At this point I do not see any reason for recurrent surgical intervention. We'll continue on the current antibiotic therapy. I plan to see her back in one week with a followup chest x-ray. She is to have esophageal dilatation by GI early next week.   [name] B 07/20/2012 11:49 AM

## 2012-07-21 ENCOUNTER — Other Ambulatory Visit: Payer: Self-pay | Admitting: Pulmonary Disease

## 2012-07-21 DIAGNOSIS — J90 Pleural effusion, not elsewhere classified: Secondary | ICD-10-CM

## 2012-07-21 DIAGNOSIS — M25569 Pain in unspecified knee: Secondary | ICD-10-CM | POA: Diagnosis not present

## 2012-07-21 DIAGNOSIS — R1312 Dysphagia, oropharyngeal phase: Secondary | ICD-10-CM | POA: Diagnosis not present

## 2012-07-21 DIAGNOSIS — E785 Hyperlipidemia, unspecified: Secondary | ICD-10-CM

## 2012-07-22 ENCOUNTER — Emergency Department (HOSPITAL_COMMUNITY): Payer: Medicare Other

## 2012-07-22 ENCOUNTER — Emergency Department (HOSPITAL_COMMUNITY)
Admission: EM | Admit: 2012-07-22 | Discharge: 2012-07-22 | Disposition: A | Discharge status: Home / Self Care | Payer: Medicare Other | Source: Home / Self Care | Attending: Emergency Medicine | Admitting: Emergency Medicine

## 2012-07-22 ENCOUNTER — Other Ambulatory Visit: Payer: Self-pay

## 2012-07-22 ENCOUNTER — Encounter (HOSPITAL_COMMUNITY): Payer: Self-pay | Admitting: Nurse Practitioner

## 2012-07-22 DIAGNOSIS — K228 Other specified diseases of esophagus: Secondary | ICD-10-CM | POA: Diagnosis not present

## 2012-07-22 DIAGNOSIS — Z950 Presence of cardiac pacemaker: Secondary | ICD-10-CM | POA: Diagnosis not present

## 2012-07-22 DIAGNOSIS — J869 Pyothorax without fistula: Secondary | ICD-10-CM | POA: Insufficient documentation

## 2012-07-22 DIAGNOSIS — M353 Polymyalgia rheumatica: Secondary | ICD-10-CM | POA: Diagnosis not present

## 2012-07-22 DIAGNOSIS — J90 Pleural effusion, not elsewhere classified: Secondary | ICD-10-CM | POA: Diagnosis not present

## 2012-07-22 DIAGNOSIS — J189 Pneumonia, unspecified organism: Secondary | ICD-10-CM | POA: Diagnosis present

## 2012-07-22 DIAGNOSIS — K220 Achalasia of cardia: Secondary | ICD-10-CM

## 2012-07-22 DIAGNOSIS — R091 Pleurisy: Secondary | ICD-10-CM | POA: Diagnosis not present

## 2012-07-22 DIAGNOSIS — K219 Gastro-esophageal reflux disease without esophagitis: Secondary | ICD-10-CM | POA: Diagnosis present

## 2012-07-22 DIAGNOSIS — R0602 Shortness of breath: Secondary | ICD-10-CM | POA: Diagnosis not present

## 2012-07-22 DIAGNOSIS — IMO0002 Reserved for concepts with insufficient information to code with codable children: Secondary | ICD-10-CM | POA: Diagnosis not present

## 2012-07-22 DIAGNOSIS — T17908A Unspecified foreign body in respiratory tract, part unspecified causing other injury, initial encounter: Secondary | ICD-10-CM | POA: Diagnosis not present

## 2012-07-22 DIAGNOSIS — Z4682 Encounter for fitting and adjustment of non-vascular catheter: Secondary | ICD-10-CM | POA: Diagnosis not present

## 2012-07-22 DIAGNOSIS — Z791 Long term (current) use of non-steroidal anti-inflammatories (NSAID): Secondary | ICD-10-CM | POA: Insufficient documentation

## 2012-07-22 DIAGNOSIS — Z8701 Personal history of pneumonia (recurrent): Secondary | ICD-10-CM | POA: Insufficient documentation

## 2012-07-22 DIAGNOSIS — M129 Arthropathy, unspecified: Secondary | ICD-10-CM | POA: Insufficient documentation

## 2012-07-22 DIAGNOSIS — J9819 Other pulmonary collapse: Secondary | ICD-10-CM | POA: Diagnosis not present

## 2012-07-22 DIAGNOSIS — J690 Pneumonitis due to inhalation of food and vomit: Secondary | ICD-10-CM | POA: Diagnosis present

## 2012-07-22 DIAGNOSIS — Z79899 Other long term (current) drug therapy: Secondary | ICD-10-CM | POA: Diagnosis not present

## 2012-07-22 DIAGNOSIS — K222 Esophageal obstruction: Secondary | ICD-10-CM | POA: Diagnosis not present

## 2012-07-22 DIAGNOSIS — R1319 Other dysphagia: Secondary | ICD-10-CM | POA: Diagnosis not present

## 2012-07-22 DIAGNOSIS — R918 Other nonspecific abnormal finding of lung field: Secondary | ICD-10-CM | POA: Diagnosis not present

## 2012-07-22 DIAGNOSIS — Z9889 Other specified postprocedural states: Secondary | ICD-10-CM | POA: Diagnosis not present

## 2012-07-22 DIAGNOSIS — G4733 Obstructive sleep apnea (adult) (pediatric): Secondary | ICD-10-CM | POA: Diagnosis present

## 2012-07-22 LAB — CBC
HCT: 33.3 % — ABNORMAL LOW (ref 36.0–46.0)
MCV: 93.3 fL (ref 78.0–100.0)
RBC: 3.57 MIL/uL — ABNORMAL LOW (ref 3.87–5.11)
RDW: 14.9 % (ref 11.5–15.5)
WBC: 11.3 10*3/uL — ABNORMAL HIGH (ref 4.0–10.5)

## 2012-07-22 LAB — BASIC METABOLIC PANEL
BUN: 20 mg/dL (ref 6–23)
CO2: 31 mEq/L (ref 19–32)
Chloride: 94 mEq/L — ABNORMAL LOW (ref 96–112)
Creatinine, Ser: 0.5 mg/dL (ref 0.50–1.10)
GFR calc Af Amer: >90 mL/min (ref >90)
Glucose, Bld: 168 mg/dL — ABNORMAL HIGH (ref 70–99)
Potassium: 3.3 mEq/L — ABNORMAL LOW (ref 3.5–5.1)

## 2012-07-22 LAB — POCT I-STAT 3, ART BLOOD GAS (G3+)
Bicarbonate: 34.5 mEq/L — ABNORMAL HIGH (ref 20.0–24.0)
Patient temperature: 98.6
pCO2 arterial: 45.7 mmHg — ABNORMAL HIGH (ref 35.0–45.0)
pH, Arterial: 7.486 — ABNORMAL HIGH (ref 7.350–7.450)

## 2012-07-22 LAB — TROPONIN I: Troponin I: <0.3 ng/mL (ref <0.30)

## 2012-07-22 LAB — POCT I-STAT TROPONIN I

## 2012-07-22 MED ORDER — LEVOFLOXACIN IN D5W 500 MG/100ML IV SOLN
500.0000 mg | Freq: Once | INTRAVENOUS | Status: AC
  Administered 2012-07-22: 500 mg via INTRAVENOUS
  Filled 2012-07-22 (×2): qty 100

## 2012-07-22 NOTE — ED Notes (Signed)
C/o sob since she had aspiration pneumonia on 12/9, was admitted here. Reports today she has been feeling weak and dizzy, more SOB, and had to use prn o2. Pt is able to speak in full sentences at rest in triage, A&Ox4.

## 2012-07-22 NOTE — ED Notes (Signed)
Pt returned from xray

## 2012-07-22 NOTE — ED Notes (Signed)
RT at the bedside to draw ABG ?

## 2012-07-22 NOTE — ED Notes (Signed)
Dr. Manus Gunning back in to speak with the pt.

## 2012-07-22 NOTE — ED Notes (Signed)
Pt transported to xray 

## 2012-07-22 NOTE — ED Provider Notes (Signed)
History  This chart was scribed for Glynn Octave, MD by Bennett Scrape, ED Scribe. This patient was seen in room D33C/D33C and the patient's care was started at 12:29 PM.  CSN: [id number]  Arrival date & time 07/22/12  1154   First MD Initiated Contact with Patient 07/22/12 1229      Chief Complaint  Patient presents with  . Shortness of Breath     The history is provided by the patient. No language interpreter was used.    [name] is a 79 y.o. female who presents to the Emergency Department complaining of worsening SOB with associated productive cough, weakness and dizziness.  She states that she has experienced SOB since she was admitted for aspiration PNA secondary to esophageal dysmotility one month ago on December 9th, 2013. She reports that while in the hospital she underwent VATS for empyema on the right on 06/23/12. She had a thoracentesis on 06/22/12 that showed an exudutive effusion/empyema and she underwent intubation and had a VATS procedure on 06/23/12. She required intubation and mechanical ventilation for 1 day and was extubated on 06/24/12. She had a chest tube in place from 06/23/12 to 06/26/12 which was removed without complication. She states that she has followed up with Dr. Frederico Hamman and Dr. Tyrone Sage since discharge and was started on Augmentin and Levaquin for empyema 4 days ago. She states that she is scheduled to have another thoracentesis in 3 days on 07/25/12. She reports that she is currently on a full liquid diet and denies any recent changes. She is supposed to be on 3L of O2 at all times at home but reports that she normally doesn't use her oxygen except when ambulating. She denies having a h/o cardiac problems. She denies CP, fevers, leg swelling, nausea, emesis, syncope, CP and abdominal pain as associated symptoms. She has a h/o GERD and arthritis and is an occasional alcohol user but denies smoking.   PCP is Dr. Frederik Pear  Past Medical History    Diagnosis Date  . Arthritis   . GERD (gastroesophageal reflux disease)   . Cataract   . Blood transfusion without reported diagnosis   . PNA (pneumonia)   . Empyema, right     VATS 06/23/2012 cultures negative to date CXR 07/19/12 persistent airfluid levels  . Polymyalgia rheumatica     on chronic Prednisone 5mg  daily    Past Surgical History  Procedure Date  . Appendectomy   . Joint replacement   . Video bronchoscopy 06/23/2012    Procedure: VIDEO BRONCHOSCOPY;  Surgeon: Delight Ovens, MD;  Location: [facility] OR;  Service: Thoracic;  Laterality: N/A;  . Video assisted thoracoscopy (vats)/decortication 06/23/2012    Procedure: VIDEO ASSISTED THORACOSCOPY (VATS)/DECORTICATION;  Surgeon: Delight Ovens, MD;  Location: [facility] OR;  Service: Thoracic;  Laterality: Right;  . Esophagogastroduodenoscopy (egd) with esophageal dilation 06/27/2012    Procedure: ESOPHAGOGASTRODUODENOSCOPY (EGD) WITH ESOPHAGEAL DILATION;  Surgeon: Meryl Dare, MD,FACG;  Location: [facility] ENDOSCOPY;  Service: Endoscopy;  Laterality: N/A;  . Video assisted thoracoscopy (vats)/empyema     06/23/2012    Family History  Problem Relation Age of Onset  . Arthritis Father   . Heart disease Father   . Cancer Brother   . Heart disease Brother   . Diabetes Son     History  Substance Use Topics  . Smoking status: Never Smoker   . Smokeless tobacco: Never Used  . Alcohol Use: Yes    No OB history provided.  Review of  Systems  A complete 10 system review of systems was obtained and all systems are negative except as noted in the HPI and PMH.   Allergies  Actonel and Ivp dye  Home Medications   Current Outpatient Rx  Name  Route  Sig  Dispense  Refill  . ALBUTEROL SULFATE HFA 108 (90 BASE) MCG/ACT IN AERS   Inhalation   Inhale 2 puffs into the lungs every 6 (six) hours as needed for wheezing.   1 Inhaler   2   . AMOXICILLIN-POT CLAVULANATE 875-125 MG PO TABS   Oral   Take 1 tablet by mouth 2 (two)  times daily.   14 tablet   0   . B COMPLEX PO TABS   Oral   Take 1 tablet by mouth daily.         Marland Kitchen BISACODYL 5 MG PO TBEC   Oral   Take 2 tablets (10 mg total) by mouth daily.   30 tablet      . DSS 100 MG PO CAPS   Oral   Take 100 mg by mouth 2 (two) times daily.   10 capsule      . HYDROCHLOROTHIAZIDE 25 MG PO TABS   Oral   Take 25 mg by mouth daily.         . IBUPROFEN 400 MG PO TABS   Oral   Take 400 mg by mouth every 6 (six) hours as needed.         Marland Kitchen LEVOFLOXACIN 500 MG PO TABS   Oral   Take 1 tablet (500 mg total) by mouth daily.   14 tablet   3   . LEVOFLOXACIN 750 MG PO TABS   Oral   Take 1 tablet (750 mg total) by mouth daily.   14 tablet   0   . METOPROLOL SUCCINATE ER 25 MG PO TB24   Oral   Take 25 mg by mouth daily. Verified that patient takes XL formulation by medication bottle.         Marland Kitchen OMEPRAZOLE 40 MG PO CPDR   Oral   Take 1 capsule (40 mg total) by mouth daily.   30 capsule   0   . PREDNISONE 5 MG PO TABS   Oral   Take 4 mg by mouth daily.          Marland Kitchen PREDNISONE 50 MG PO TABS      Take as instructed prior to CT scan   3 tablet   0   . VITAMIN C 500 MG PO TABS   Oral   Take 500 mg by mouth daily.         Marland Kitchen VITAMIN A & D PO   Oral   Take 1 capsule by mouth daily.           Triage Vitals: BP 124/61  Pulse 115  Temp 98.6 F (37 C) (Oral)  Resp 22  SpO2 97%  Physical Exam  Nursing note and vitals reviewed. Constitutional: She is oriented to person, place, and time. She appears well-developed and well-nourished. No distress.  HENT:  Head: Normocephalic and atraumatic.  Mouth/Throat: Oropharynx is clear and moist.  Eyes: Conjunctivae normal and EOM are normal. Pupils are equal, round, and reactive to light.  Neck: Neck supple. No tracheal deviation present.  Cardiovascular: Normal rate and regular rhythm.   Pulmonary/Chest: Effort normal. No respiratory distress.       Decreased lung sounds on right,  healing chest tube site  Abdominal: Soft. There is no tenderness.  Musculoskeletal: Normal range of motion. She exhibits edema.       asymmetrical right leg swelling  Neurological: She is alert and oriented to person, place, and time.  Skin: Skin is warm and dry.  Psychiatric: She has a normal mood and affect. Her behavior is normal.    ED Course  Procedures (including critical care time)  DIAGNOSTIC STUDIES: Oxygen Saturation is 97% on 3L of O2, adequate by my interpretation.    COORDINATION OF CARE: 12:57 PM- Discussed treatment plan which includes CXR, CBC panel, heart markers and d-dimer with pt at bedside and pt agreed to plan.   1:31 PM- Consult complete with Dr. Kriste Basque. Patient case explained and discussed. Dr. Kriste Basque wants pt to be started on antibiotics and have GI evaluate. Call ended at 1:32 PM.   1:54 PM- Informed pt of Dr. Kriste Basque consult. Pt confirms that she has a balloon dilation scheduled in 2 days. Discussed admission for IV antibiotics.  2:26 PM- Consult complete with Amahi. Patient case explained and discussed. Dr. Juluis Mire does not want to admit. Call ended at 2:28 PM.  Labs Reviewed  BASIC METABOLIC PANEL - Abnormal; Notable for the following:    Potassium 3.3 (*)     Chloride 94 (*)     Glucose, Bld 168 (*)     GFR calc non Af Amer 88 (*)     All other components within normal limits  D-DIMER, QUANTITATIVE - Abnormal; Notable for the following:    D-Dimer, Quant 6.25 (*)     All other components within normal limits  CBC - Abnormal; Notable for the following:    WBC 11.3 (*)     RBC 3.57 (*)     Hemoglobin 10.8 (*)     HCT 33.3 (*)     All other components within normal limits  POCT I-STAT 3, BLOOD GAS (G3+) - Abnormal; Notable for the following:    pH, Arterial 7.486 (*)     pCO2 arterial 45.7 (*)     Bicarbonate 34.5 (*)     Acid-Base Excess 10.0 (*)     All other components within normal limits  PRO B NATRIURETIC PEPTIDE  TROPONIN I  POCT I-STAT  TROPONIN I   Dg Chest 2 View  07/22/2012  *RADIOLOGY REPORT*  Clinical Data: Shortness of breath and weakness.  CHEST - 2 VIEW  Comparison: Multiple priors, most recently 07/20/2012.  Findings: Loculated gas fluid collection in the base of the right hemithorax is again noted, concerning for potential empyema. Associated atelectasis and/or consolidation in the right lung base. The lung is clear.  No evidence of pulmonary edema.  Heart size is upper limits of normal.  Mediastinal contours are remarkable for a distended and partially fluid-filled esophagus.  Atherosclerosis of the thoracic aorta.  Left-sided pacemaker device in place with lead tips projecting over the expected location of the right atrium and right ventricular apex.  IMPRESSION: 1.  The radiographic appearance of the chest is essentially unchanged, as above.   Original Report Authenticated By: Trudie Reed, M.D.    US Venous Img Lower Bilateral  07/20/2012  *RADIOLOGY REPORT*  Clinical Data: Short of breath  VENOUS DUPLEX ULTRASOUND OF BILATERAL LOWER EXTREMITIES  Technique:  Gray-scale sonography with graded compression, as well as color Doppler and duplex ultrasound, were performed to evaluate the deep venous system of both lower extremities from the level of the common femoral vein through the popliteal and proximal calf veins.  Spectral Doppler was utilized to evaluate flow at rest and with distal augmentation maneuvers.  Comparison:  None.  Findings: There is complete compressibility of the bilateral common femoral, femoral, and popliteal veins.  Doppler analysis demonstrates respiratory phasicity and augmentation of flow upon calf  compression.  The left greater saphenous vein is compressible.  The right greater saphenous vein was not visualized.  A perforating branch in the right calf is patent and compressible.  Some varicosities are thrombosed.  Others are compressible.  IMPRESSION: No evidence of DVT in the lower extremities.    Original Report Authenticated By: Jolaine Click, M.D.      No diagnosis found.    MDM  Patient's recent hospitalization for aspiration pneumonia secondary to esophageal dysmotility status post VATS for empyema now with shortness of breath that worsened today. Associated with weakness and dizziness. Denies any chest pain or fever. Recently saw thoracic surgery and pulmonary and taking Augmentin and Levaquin for likely recurrent aspiration  Per Dr. Liliane Channel office note 07/19/12. Dolle was diagnosed with aspiration pneumonia secondary to esophageal dysmotility on 06/19/2012 and underwent VATS for empyema on 06/23/2012. Thoracentesis on 12/12 demonstrated an exudutive effusion/empyema and she underwent intubation and VATS procedure on 12/13. She required intubation and mechanical ventilation for 1 day and was extubated on 12/14. She had a chest tube in place from 12/13 to 12/16 which was removed without complication.    Case discussed extensively with Dr. Kriste Basque and Dr. Donnal Debar.   patient had CT scan 2 days ago that showed no pulmonary embolism in stable likely due to right-sided pleural effusion. She's been noncompliant with her oxygen at home and she did not know she is supposed to wear it continuously 24 hours today. She continues to take her Levaquin and Augmentin. Her esophagus is dilated secondary to her dysmotility syndrome and she likely aspirated from that. She has a esophageal dilation scheduled on Monday. She has a IR Carter thoracentesis scheduled on Tuesday. ABG today unchanged. Doubt PE due to negative scan 2 days ago.  Dr. Kriste Basque and Dr. Lenard Simmer agree there is no indication for admission. the patient will return if she develops worsening shortness of breath. She and her husband are comfortable with this plan.   Date: 07/22/2012  Rate: 112  Rhythm: sinus tachycardia  QRS Axis: normal  Intervals: normal  ST/T Wave abnormalities: nonspecific ST/T changes  Conduction Disutrbances:right bundle  branch block  Narrative Interpretation:   Old EKG Reviewed: unchanged   I personally performed the services described in this documentation, which was scribed in my presence. The recorded information has been reviewed and is accurate.       Glynn Octave, MD 07/22/12 [id number]

## 2012-07-23 LAB — FUNGUS CULTURE W SMEAR

## 2012-07-24 ENCOUNTER — Other Ambulatory Visit: Payer: Self-pay | Admitting: *Deleted

## 2012-07-24 ENCOUNTER — Encounter (HOSPITAL_COMMUNITY): Payer: Self-pay | Admitting: *Deleted

## 2012-07-24 ENCOUNTER — Encounter (HOSPITAL_COMMUNITY)
Admission: RE | Disposition: A | Discharge status: Home / Self Care | Payer: Self-pay | Source: Ambulatory Visit | Attending: Gastroenterology

## 2012-07-24 ENCOUNTER — Inpatient Hospital Stay (HOSPITAL_COMMUNITY)
Admission: AD | Admit: 2012-07-24 | Discharge: 2012-07-31 | DRG: 166 | Disposition: A | Discharge status: Home / Self Care | Payer: Medicare Other | Source: Ambulatory Visit | Attending: Internal Medicine | Admitting: Internal Medicine

## 2012-07-24 ENCOUNTER — Ambulatory Visit (HOSPITAL_BASED_OUTPATIENT_CLINIC_OR_DEPARTMENT_OTHER)
Admission: RE | Admit: 2012-07-24 | Discharge: 2012-07-24 | Disposition: A | Discharge status: Home / Self Care | Payer: Medicare Other | Source: Ambulatory Visit | Attending: Gastroenterology | Admitting: Gastroenterology

## 2012-07-24 ENCOUNTER — Telehealth: Payer: Self-pay | Admitting: Pulmonary Disease

## 2012-07-24 ENCOUNTER — Ambulatory Visit: Payer: Medicare Other | Admitting: Gastroenterology

## 2012-07-24 DIAGNOSIS — J690 Pneumonitis due to inhalation of food and vomit: Secondary | ICD-10-CM

## 2012-07-24 DIAGNOSIS — J869 Pyothorax without fistula: Principal | ICD-10-CM | POA: Diagnosis present

## 2012-07-24 DIAGNOSIS — K220 Achalasia of cardia: Secondary | ICD-10-CM | POA: Insufficient documentation

## 2012-07-24 DIAGNOSIS — K222 Esophageal obstruction: Secondary | ICD-10-CM | POA: Diagnosis present

## 2012-07-24 DIAGNOSIS — R1319 Other dysphagia: Secondary | ICD-10-CM

## 2012-07-24 DIAGNOSIS — K219 Gastro-esophageal reflux disease without esophagitis: Secondary | ICD-10-CM | POA: Diagnosis present

## 2012-07-24 DIAGNOSIS — Z950 Presence of cardiac pacemaker: Secondary | ICD-10-CM

## 2012-07-24 DIAGNOSIS — M129 Arthropathy, unspecified: Secondary | ICD-10-CM | POA: Insufficient documentation

## 2012-07-24 DIAGNOSIS — G4733 Obstructive sleep apnea (adult) (pediatric): Secondary | ICD-10-CM | POA: Diagnosis present

## 2012-07-24 DIAGNOSIS — J90 Pleural effusion, not elsewhere classified: Secondary | ICD-10-CM

## 2012-07-24 DIAGNOSIS — E042 Nontoxic multinodular goiter: Secondary | ICD-10-CM

## 2012-07-24 DIAGNOSIS — M353 Polymyalgia rheumatica: Secondary | ICD-10-CM | POA: Diagnosis present

## 2012-07-24 DIAGNOSIS — T17908A Unspecified foreign body in respiratory tract, part unspecified causing other injury, initial encounter: Secondary | ICD-10-CM

## 2012-07-24 DIAGNOSIS — J9859 Other diseases of mediastinum, not elsewhere classified: Secondary | ICD-10-CM

## 2012-07-24 DIAGNOSIS — J189 Pneumonia, unspecified organism: Secondary | ICD-10-CM | POA: Diagnosis present

## 2012-07-24 DIAGNOSIS — Z79899 Other long term (current) drug therapy: Secondary | ICD-10-CM

## 2012-07-24 DIAGNOSIS — IMO0002 Reserved for concepts with insufficient information to code with codable children: Secondary | ICD-10-CM

## 2012-07-24 HISTORY — PX: BALLOON DILATION: SHX5330

## 2012-07-24 HISTORY — PX: ESOPHAGOGASTRODUODENOSCOPY: SHX5428

## 2012-07-24 SURGERY — EGD (ESOPHAGOGASTRODUODENOSCOPY)
Anesthesia: Moderate Sedation

## 2012-07-24 MED ORDER — MIDAZOLAM HCL 10 MG/2ML IJ SOLN
INTRAMUSCULAR | Status: DC | PRN
  Administered 2012-07-24 (×3): 2 mg via INTRAVENOUS

## 2012-07-24 MED ORDER — SODIUM CHLORIDE 0.9 % IV SOLN
INTRAVENOUS | Status: DC
  Administered 2012-07-24: 10:00:00 via INTRAVENOUS

## 2012-07-24 MED ORDER — FENTANYL CITRATE 0.05 MG/ML IJ SOLN
INTRAMUSCULAR | Status: AC
  Filled 2012-07-24: qty 2

## 2012-07-24 MED ORDER — MIDAZOLAM HCL 10 MG/2ML IJ SOLN
INTRAMUSCULAR | Status: AC
  Filled 2012-07-24: qty 2

## 2012-07-24 MED ORDER — FENTANYL CITRATE 0.05 MG/ML IJ SOLN
INTRAMUSCULAR | Status: DC | PRN
  Administered 2012-07-24 (×3): 25 ug via INTRAVENOUS

## 2012-07-24 NOTE — Interval H&P Note (Signed)
History and Physical Interval Note:  07/24/2012 10:40 AM  Sanoe Franchot Mimes  has presented today for surgery, with the diagnosis of dysphagia  The various methods of treatment have been discussed with the patient and family. After consideration of risks, benefits and other options for treatment, the patient has consented to  Procedure(s) (LRB) with comments: ESOPHAGOGASTRODUODENOSCOPY (EGD) (N/A) BALLOON DILATION (N/A) as a surgical intervention .  The patient's history has been reviewed, patient examined, no change in status, stable for surgery.  I have reviewed the patient's chart and labs.  Questions were answered to the patient's satisfaction.    The recent H&P (dated *07/17/12**) was reviewed, the patient was examined and there is no change in the patients condition since that H&P was completed.   Melvia Heaps  07/24/2012, 10:40 AM    Melvia Heaps

## 2012-07-24 NOTE — Telephone Encounter (Signed)
Pt wanted to know if she needs to fast for this test and she does not she is aware of that Tobe Sos

## 2012-07-24 NOTE — H&P (View-Only) (Signed)
History of Present Illness:  79 year old white female seen last month during hospitalization for pneumonia. A megaesophagus was noted by CT scan. Upper endoscopy confirmed this. There was mild resistance to the passage of the endoscope to the GE junction.  Patient appears to be aspirating and has continued dysphagia to solids and liquids. Esophageal manometry at [facility] demonstrated a mean LES pressure of 8.7 with normal relaxation with swallowing. The esophagus was noted to be aperistaltic.  He is drinking liquids supplements only. She remains on antibiotics for pneumonia.    Past Medical History  Diagnosis Date  . Arthritis   . GERD (gastroesophageal reflux disease)   . Cataract   . Blood transfusion without reported diagnosis    Past Surgical History  Procedure Date  . Appendectomy   . Joint replacement   . Video bronchoscopy 06/23/2012    Procedure: VIDEO BRONCHOSCOPY;  Surgeon: Delight Ovens, MD;  Location: [facility] OR;  Service: Thoracic;  Laterality: N/A;  . Video assisted thoracoscopy (vats)/decortication 06/23/2012    Procedure: VIDEO ASSISTED THORACOSCOPY (VATS)/DECORTICATION;  Surgeon: Delight Ovens, MD;  Location: [facility] OR;  Service: Thoracic;  Laterality: Right;  . Esophagogastroduodenoscopy (egd) with esophageal dilation 06/27/2012    Procedure: ESOPHAGOGASTRODUODENOSCOPY (EGD) WITH ESOPHAGEAL DILATION;  Surgeon: Meryl Dare, MD,FACG;  Location: [facility] ENDOSCOPY;  Service: Endoscopy;  Laterality: N/A;   family history includes Arthritis in her father; Cancer in her brother; Diabetes in her son; and Heart disease in her brother and father. Current Outpatient Prescriptions  Medication Sig Dispense Refill  . albuterol (PROVENTIL HFA;VENTOLIN HFA) 108 (90 BASE) MCG/ACT inhaler Inhale 2 puffs into the lungs every 6 (six) hours as needed for wheezing.  1 Inhaler  2  . b complex vitamins tablet Take 1 tablet by mouth daily.      . bisacodyl (DULCOLAX) 5 MG EC tablet Take 2 tablets (10  mg total) by mouth daily.  30 tablet    . docusate sodium 100 MG CAPS Take 100 mg by mouth 2 (two) times daily.  10 capsule    . hydrochlorothiazide (HYDRODIURIL) 25 MG tablet Take 25 mg by mouth daily.      Marland Kitchen ibuprofen (ADVIL,MOTRIN) 400 MG tablet Take 400 mg by mouth every 6 (six) hours as needed.      Marland Kitchen levofloxacin (LEVAQUIN) 750 MG tablet Take 1 tablet (750 mg total) by mouth daily.  14 tablet  0  . metoprolol succinate (TOPROL-XL) 25 MG 24 hr tablet Take 25 mg by mouth daily. Verified that patient takes XL formulation by medication bottle.      Marland Kitchen omeprazole (PRILOSEC) 40 MG capsule Take 1 capsule (40 mg total) by mouth daily.  30 capsule  0  . predniSONE (DELTASONE) 5 MG tablet Take 4 mg by mouth daily.       . vitamin C (ASCORBIC ACID) 500 MG tablet Take 500 mg by mouth daily.      . Vitamins A & D (VITAMIN A & D PO) Take 1 capsule by mouth daily.       Allergies as of 07/17/2012 - Review Complete 07/17/2012  Allergen Reaction Noted  . Actonel (risedronate sodium)  06/19/2012  . Ivp dye (iodinated diagnostic agents) Nausea And Vomiting 06/19/2012    reports that she has never smoked. She has never used smokeless tobacco. She reports that she drinks alcohol. She reports that she does not use illicit drugs.     Review of Systems: She complains of a persistent inability to take a full  breath. She's been having some low-grade fevers at night. Pertinent positive and negative review of systems were noted in the above HPI section. All other review of systems were otherwise negative.  Vital signs were reviewed in today's medical record Physical Exam: General: Well developed , well nourished, no acute distress Head: Normocephalic and atraumatic Eyes:  sclerae anicteric, EOMI Ears: Normal auditory acuity Mouth: No deformity or lesions Neck: Supple, no masses or thyromegaly Lungs: There are a few right basilar posterior rales. She has decreased breath sounds on the right Heart: Regular  rate and rhythm; no murmurs, rubs or bruits Abdomen: Soft, non tender and non distended. No masses, hepatosplenomegaly or hernias noted. Normal Bowel sounds Rectal:deferred Musculoskeletal: Symmetrical with no gross deformities  Skin: No lesions on visible extremities Pulses:  Normal pulses noted Extremities: No clubbing, cyanosis, edema or deformities noted Neurological: Alert oriented x 4, grossly nonfocal Cervical Nodes:  No significant cervical adenopathy Inguinal Nodes: No significant inguinal adenopathy Psychological:  Alert and cooperative. Normal mood and affect

## 2012-07-24 NOTE — Op Note (Addendum)
[facility] [address] [place], [address]   ENDOSCOPY PROCEDURE REPORT  PATIENT: Jill, Oneill  MR#: [id number] BIRTHDATE: 12/17/1928 , 83  yrs. old GENDER: Female ENDOSCOPIST: Louis Meckel, MD ASSISTANT:   Karie Soda, technician Darral Dash, RN REFERRED HQ:[facility], M.D. PROCEDURE DATE:  07/24/2012 PROCEDURE:   EGD with balloon dilatation ASA CLASS:   Class III INDICATIONS:dysphagia. MEDICATIONS: MAC sedation, administered by CRNA, Versed 6 mg IV, and Fentanyl 75 mcg IV TOPICAL ANESTHETIC:   Cetacaine Spray  DESCRIPTION OF PROCEDURE:   After the risks benefits and alternatives of the procedure were thoroughly explained, informed consent was obtained.  The     endoscope was introduced through the mouth  and advanced to the third portion of the duodenum ,      The instrument was slowly withdrawn as the mucosa was carefully examined.    Again noted was a megaesophagus with a large amount of retained secretions.  At the GE junction there appeared to be moderate stenosis.  The scope passed with mild resistance.   Again noted was a megaesophagus with a large amount of retained secretions.  At the GE junction there appeared to be moderate stenosis.  The scope passed with mild resistance.   The remainder of the upper endoscopy exam was otherwise normal.     Dilation was then performed at the gastroesphageal junction  Dilator:Balloon Size:04-22-11-13.5-15mm dilators were passed. There was a minimal amount of heme.  It was noted that there was initially more resistance to the 11and 12mm balloons than to the 14and 15 mm balloons.  COMPLICATIONS: There were no complications. ENDOSCOPIC IMPRESSION: 1.   megaesophagus 2.  stenosis at the GE junction-status post savory  dilatation  RECOMMENDATIONS: office visit 3-4 weeks Barium swallow prior to next office visit  eSigned:  Louis Meckel, MD 07/31/2012 8:52 AM Revised: 07/31/2012  8:52 AM CC:

## 2012-07-25 ENCOUNTER — Telehealth: Payer: Self-pay

## 2012-07-25 ENCOUNTER — Ambulatory Visit (HOSPITAL_COMMUNITY)
Admission: RE | Admit: 2012-07-25 | Discharge: 2012-07-25 | Disposition: A | Discharge status: Home / Self Care | Payer: Medicare Other | Source: Ambulatory Visit | Attending: Radiology | Admitting: Radiology

## 2012-07-25 ENCOUNTER — Other Ambulatory Visit: Payer: Self-pay | Admitting: Gastroenterology

## 2012-07-25 ENCOUNTER — Ambulatory Visit (HOSPITAL_COMMUNITY)
Admission: RE | Admit: 2012-07-25 | Discharge: 2012-07-25 | Disposition: A | Discharge status: Home / Self Care | Payer: Medicare Other | Source: Ambulatory Visit | Attending: Pulmonary Disease | Admitting: Pulmonary Disease

## 2012-07-25 ENCOUNTER — Encounter (HOSPITAL_COMMUNITY): Payer: Self-pay | Admitting: Gastroenterology

## 2012-07-25 DIAGNOSIS — J90 Pleural effusion, not elsewhere classified: Secondary | ICD-10-CM

## 2012-07-25 DIAGNOSIS — M353 Polymyalgia rheumatica: Secondary | ICD-10-CM

## 2012-07-25 DIAGNOSIS — J869 Pyothorax without fistula: Principal | ICD-10-CM

## 2012-07-25 DIAGNOSIS — R091 Pleurisy: Secondary | ICD-10-CM | POA: Diagnosis not present

## 2012-07-25 DIAGNOSIS — Z9889 Other specified postprocedural states: Secondary | ICD-10-CM | POA: Diagnosis not present

## 2012-07-25 DIAGNOSIS — J189 Pneumonia, unspecified organism: Secondary | ICD-10-CM

## 2012-07-25 DIAGNOSIS — R1310 Dysphagia, unspecified: Secondary | ICD-10-CM

## 2012-07-25 DIAGNOSIS — J690 Pneumonitis due to inhalation of food and vomit: Secondary | ICD-10-CM

## 2012-07-25 LAB — BODY FLUID CELL COUNT WITH DIFFERENTIAL
Eos, Fluid: 5 %
Lymphs, Fluid: 7 %
Neutrophil Count, Fluid: 83 % — ABNORMAL HIGH (ref 0–25)

## 2012-07-25 LAB — CBC WITH DIFFERENTIAL/PLATELET
Basophils Absolute: 0 10*3/uL (ref 0.0–0.1)
Eosinophils Relative: 0 % (ref 0–5)
HCT: 35.4 % — ABNORMAL LOW (ref 36.0–46.0)
Hemoglobin: 11.7 g/dL — ABNORMAL LOW (ref 12.0–15.0)
Lymphocytes Relative: 18 % (ref 12–46)
Lymphs Abs: 1.7 10*3/uL (ref 0.7–4.0)
MCV: 93.2 fL (ref 78.0–100.0)
Monocytes Absolute: 0.6 10*3/uL (ref 0.1–1.0)
Neutro Abs: 7.4 10*3/uL (ref 1.7–7.7)
RBC: 3.8 MIL/uL — ABNORMAL LOW (ref 3.87–5.11)
RDW: 15 % (ref 11.5–15.5)
WBC: 9.8 10*3/uL (ref 4.0–10.5)

## 2012-07-25 LAB — LACTATE DEHYDROGENASE, PLEURAL OR PERITONEAL FLUID: LD, Fluid: >10000 U/L — ABNORMAL HIGH (ref 3–23)

## 2012-07-25 LAB — AMYLASE, BODY FLUID: Amylase, Fluid: <3 U/L

## 2012-07-25 LAB — COMPREHENSIVE METABOLIC PANEL
AST: 13 U/L (ref 0–37)
Albumin: 2.7 g/dL — ABNORMAL LOW (ref 3.5–5.2)
BUN: 11 mg/dL (ref 6–23)
Calcium: 9.6 mg/dL (ref 8.4–10.5)
Chloride: 96 mEq/L (ref 96–112)
Creatinine, Ser: 0.53 mg/dL (ref 0.50–1.10)
Total Bilirubin: 0.3 mg/dL (ref 0.3–1.2)
Total Protein: 6.9 g/dL (ref 6.0–8.3)

## 2012-07-25 LAB — PROTEIN, BODY FLUID: Total protein, fluid: 3.6 g/dL

## 2012-07-25 LAB — URINALYSIS, ROUTINE W REFLEX MICROSCOPIC
Glucose, UA: NEGATIVE mg/dL
Hgb urine dipstick: NEGATIVE
Ketones, ur: NEGATIVE mg/dL
Protein, ur: NEGATIVE mg/dL
Urobilinogen, UA: 0.2 mg/dL (ref 0.0–1.0)

## 2012-07-25 LAB — TYPE AND SCREEN
ABO/RH(D): A POS
Antibody Screen: NEGATIVE

## 2012-07-25 LAB — MAGNESIUM: Magnesium: 2.1 mg/dL (ref 1.5–2.5)

## 2012-07-25 LAB — APTT: aPTT: 28 seconds (ref 24–37)

## 2012-07-25 MED ORDER — SODIUM CHLORIDE 0.9 % IJ SOLN: 3.0000 mL | INTRAMUSCULAR | Status: DC | PRN

## 2012-07-25 MED ORDER — LEVOFLOXACIN 500 MG PO TABS: 500.0000 mg | ORAL_TABLET | Freq: Every day | ORAL | Status: DC

## 2012-07-25 MED ORDER — PIPERACILLIN-TAZOBACTAM 3.375 G IVPB
3.3750 g | Freq: Three times a day (TID) | INTRAVENOUS | Status: DC
  Administered 2012-07-25 – 2012-07-30 (×14): 3.375 g via INTRAVENOUS
  Filled 2012-07-25 (×17): qty 50

## 2012-07-25 MED ORDER — ALBUTEROL SULFATE HFA 108 (90 BASE) MCG/ACT IN AERS: 4.0000 | INHALATION_SPRAY | RESPIRATORY_TRACT | Status: DC | PRN

## 2012-07-25 MED ORDER — DOCUSATE SODIUM 100 MG PO CAPS
100.0000 mg | ORAL_CAPSULE | Freq: Two times a day (BID) | ORAL | Status: DC
  Administered 2012-07-25 – 2012-07-31 (×12): 100 mg via ORAL
  Filled 2012-07-25 (×13): qty 1

## 2012-07-25 MED ORDER — ALBUTEROL SULFATE (5 MG/ML) 0.5% IN NEBU: 2.5000 mg | INHALATION_SOLUTION | RESPIRATORY_TRACT | Status: DC | PRN

## 2012-07-25 MED ORDER — SODIUM CHLORIDE 0.9 % IV SOLN: 250.0000 mL | INTRAVENOUS | Status: DC | PRN

## 2012-07-25 MED ORDER — SODIUM CHLORIDE 0.9 % IJ SOLN
3.0000 mL | Freq: Two times a day (BID) | INTRAMUSCULAR | Status: DC
  Administered 2012-07-25 – 2012-07-31 (×8): 3 mL via INTRAVENOUS

## 2012-07-25 MED ORDER — HYDROCHLOROTHIAZIDE 25 MG PO TABS
25.0000 mg | ORAL_TABLET | Freq: Every day | ORAL | Status: DC
  Administered 2012-07-26 – 2012-07-31 (×6): 25 mg via ORAL
  Filled 2012-07-25 (×7): qty 1

## 2012-07-25 MED ORDER — PREDNISONE 1 MG PO TABS
4.0000 mg | ORAL_TABLET | Freq: Every day | ORAL | Status: DC
  Administered 2012-07-26 – 2012-07-31 (×6): 4 mg via ORAL
  Filled 2012-07-25 (×7): qty 4

## 2012-07-25 MED ORDER — LEVOFLOXACIN IN D5W 500 MG/100ML IV SOLN
500.0000 mg | INTRAVENOUS | Status: DC
  Administered 2012-07-25 – 2012-07-27 (×3): 500 mg via INTRAVENOUS
  Filled 2012-07-25 (×5): qty 100

## 2012-07-25 MED ORDER — IPRATROPIUM BROMIDE 0.02 % IN SOLN: 0.5000 mg | RESPIRATORY_TRACT | Status: DC | PRN

## 2012-07-25 MED ORDER — METOPROLOL SUCCINATE ER 25 MG PO TB24
25.0000 mg | ORAL_TABLET | Freq: Every day | ORAL | Status: DC
  Administered 2012-07-25 – 2012-07-31 (×7): 25 mg via ORAL
  Filled 2012-07-25 (×7): qty 1

## 2012-07-25 MED ORDER — VANCOMYCIN HCL IN DEXTROSE 1-5 GM/200ML-% IV SOLN
1000.0000 mg | Freq: Once | INTRAVENOUS | Status: AC
  Administered 2012-07-25: 1000 mg via INTRAVENOUS
  Filled 2012-07-25: qty 200

## 2012-07-25 MED ORDER — PANTOPRAZOLE SODIUM 40 MG IV SOLR
40.0000 mg | Freq: Every day | INTRAVENOUS | Status: DC
  Administered 2012-07-25: 40 mg via INTRAVENOUS
  Filled 2012-07-25 (×2): qty 40

## 2012-07-25 MED ORDER — POTASSIUM CHLORIDE CRYS ER 20 MEQ PO TBCR
40.0000 meq | EXTENDED_RELEASE_TABLET | Freq: Once | ORAL | Status: AC
  Administered 2012-07-25: 40 meq via ORAL
  Filled 2012-07-25: qty 2

## 2012-07-25 MED ORDER — VANCOMYCIN HCL 500 MG IV SOLR
500.0000 mg | Freq: Two times a day (BID) | INTRAVENOUS | Status: DC
  Administered 2012-07-26 – 2012-07-30 (×9): 500 mg via INTRAVENOUS
  Filled 2012-07-25 (×11): qty 500

## 2012-07-25 NOTE — Telephone Encounter (Signed)
Pt scheduled for barium swallow 07/31/12 at [facility]. Pt to arrive at 9:15am for a 9:30am appt. Pt aware of appt date and time.

## 2012-07-25 NOTE — Consult Note (Signed)
[address]            [name] [address]          [phone]    PCP is GREEN, Lenon Curt, MD Referring Provider is Chinita Pester, PA    History of Presenting Illness: This is an 79 y.o. Female, known to Dr. Tyrone Sage from previous bronchoscopy,right VATS, drainage of complex pleural effusion, and decortication on 06/23/2012. On that admission, she had presented to the Emergency Department with a 2 day complaint of progressively worsening shortness of breath. There was no associated wheezing or cough, but the patient stated she "just couldn't catch her breath." A CXR showed a large right sided pleural effusion, a paramediastinal mass superior to the right hilum, and loculated fluid along the right horizontal fissure. These findings were concerning for possible malignancy vs infectious process. The patient was placed on oxygen and admitted to the Hospitalist's service for further evaluation. She underwent a CT scan which showed pleural fluid in the fissure of the right lung, that they felt was the cause of the mass like appearance seen on CXR. It also showed some esophageal dilatation and enlarged thyroid nodule. The patient was placed on Levaquin for possible pneumonia . She underwent a right thoracentesis, which resulted in a low volume drainage of 45cc of hazy yellow fluid, which was sent for cytology and culture. Ultimately, she underwent the aforementioned right VATS. She recovered fairly well and was discharged home in stable condition on 06/28/2012.      Esophageal manometry was arranged to be done at [facility] as an outpatient. According to medical records, she had a mean LES pressure of 8.7 with normal relaxation with swallowing. The esophagus was noted to be aperistaltic. She was instructed she may eat a soft diet on 07/17/2012 and she was to see Dr. Arlyce Dice. The patient states she has maintained a liquid diet since early December.      She saw Dr. Tyrone Sage on  07/16/2012 for a routine post op follow up appointment. She had complaints of cough and fever.Chest x ray done showed foodstuffs in dilated esophagus, persistent but slightly improved basilar changes on the right, no pneumothorax, and right pleural effusion. It was felt she likely had recurrent pneumonia and that she should be admitted for further evaluation and treatment;however, the patient did not want to be admitted at that time. She was given a prescription for Levaquin,  she was instructed to contact the office if her symptoms or fever worsened, and a follow up appointment with [facility] was arranged, .       She was then seen be Dr. Frederico Hamman on 07/20/2012. Per medical records, Dr. Frederico Hamman [facility] pulmonary[facility] felt she had RLL pneumonia (and early empyema) with recurrent aspiration due to intra esophageal reflux in the context of LES stenosis and esophageal dilation. Augmentin was added to Levaquin for better anaerobic coverage. CT scan of the chest was done. Results showed marked hiatal hernia with diffuse esophageal distention (and the esophagus is filled with food material),the maximum transverse diameter the upper esophagus is 7.4 cm compared to 6.0 cm on the prior study. There was a moderate pleural effusion on the right which did contain foci of air.There was consolidation in the right lower lobe. There was no pneumothorax or pulmonary embolus. Left lung remained clear, except for minimal left base atelectasis.Thyroid remained enlarged with multinodular goiter, stable. In  the visualized upper abdomen, there was an apparent hemangioma in the anterior spleen measuring 1.5 x 1.5 cm.. Also, she had an US of the lower extremeties done 1/9 which was negative for DVT.She was also scheduled for a dilation for her LES stenosis. Dr. Tyrone Sage then saw her again on 07/20/2012. Thoracic surgery was not recommended at that time and antibiotics were continued.      The patient then developed shortness of breath,  weakness, and dizziness. She denied chest pain or fever.It should be noted that she is to be on oxygen at all times, but she apparently only uses it when ambulating.She presented to [facility] Emergency Department on 07/22/2012 for further evaluation and treatment.Her ABG and chest x ray were unchanged from previously. CT of chest 2 days ago did not show a pulmonary embolus.Ultimately, she was not admitted. She was instructed to continuously use her oxygen, continue with previous antibiotics until finished, and she already had an appointment for IR to do a right thoracentesis. Patient and her husband were in agreement with this plan.       She underwent an upper endoscopy and esophageal dilatation by Dr. Arlyce Dice 1/13 at [facility] showed a megaesophagus and stenosis at the GE junction. This area was dilated. Today, she underwent a right thoracentesis. Approximately 65 cc of thick, purulent reddish brown fluid was removed. Cultures are pending. She is being admitted by pulmonary for further evaluation and treatment and a thoracic consultation was requested.  Currently, she has complaints of difficulty with swallowing, cough, and shortness of breath (more so with exertion).She denies fever, abdominal pain, nausea, emesis.     Past Medical History: Past Medical History  Diagnosis Date  . GERD (gastroesophageal reflux disease)   . Cataract   . Blood transfusion without reported diagnosis   . PNA (pneumonia)   . Empyema, right     VATS 06/23/2012 cultures negative to date CXR 07/19/12 persistent airfluid levels  . Polymyalgia rheumatica     on chronic Prednisone 5mg  daily  . Sleep apnea     CPAP at night  . Pacemaker     medtronic  . Neuromuscular disorder     polymyalgia rheumatica  . Arthritis     Past Surgical History: Past Surgical History  Procedure Date  . Appendectomy   . Video bronchoscopy 06/23/2012    Procedure: VIDEO BRONCHOSCOPY;  Surgeon: Delight Ovens, MD;   Location: [facility] OR;  Service: Thoracic;  Laterality: N/A;  . Video assisted thoracoscopy (vats)/decortication 06/23/2012    Procedure: RIGHT VIDEO ASSISTED THORACOSCOPY (VATS)/DECORTICATION;  Surgeon: Delight Ovens, MD;  Location: [facility] OR;  Service: Thoracic;  Laterality: Right;  . Esophagogastroduodenoscopy (egd) with esophageal dilation 06/27/2012    Procedure: ESOPHAGOGASTRODUODENOSCOPY (EGD) WITH ESOPHAGEAL DILATION;  Surgeon: Meryl Dare, MD,FACG;  Location: [facility] ENDOSCOPY;  Service: Endoscopy;  Laterality: N/A;  . Video assisted thoracoscopy (vats)/empyema     06/23/2012  . Eye surgery     bilateral cataract removal  . Joint replacement     right hip  . Esophagogastroduodenoscopy 07/24/2012    Procedure: ESOPHAGOGASTRODUODENOSCOPY (EGD);  Surgeon: Louis Meckel, MD;  Location: [facility] ENDOSCOPY;  Service: Endoscopy;  Laterality: N/A;  . Balloon dilation 07/24/2012    Procedure: BALLOON DILATION;  Surgeon: Louis Meckel, MD;  Location: [facility] ENDOSCOPY;  Service: Endoscopy;  Laterality: N/A;    Family History: Family History  Problem Relation Age of Onset  . Arthritis Father   . Heart disease Father   .  Cancer Brother   . Heart disease Brother   . Diabetes Son     Social History History  Substance Use Topics  . Smoking status: Never Smoker   . Smokeless tobacco: Never Used  . Alcohol Use: 1.8 oz/week    3 Glasses of wine per week     Comment: 3x a week    Current Facility-Administered Medications  Medication Dose Route Frequency Provider Last Rate Last Dose  . 0.9 %  sodium chloride infusion  250 mL Intravenous PRN Elwyn Lade Albon, MD      . 0.9 %  sodium chloride infusion  250 mL Intravenous PRN Roxine Caddy, MD      . albuterol (PROVENTIL HFA;VENTOLIN HFA) 108 (90 BASE) MCG/ACT inhaler 4 puff  4 puff Inhalation Q2H PRN Roxine Caddy, MD      . albuterol (PROVENTIL) (5 MG/ML) 0.5% nebulizer solution 2.5 mg  2.5 mg Nebulization Q4H PRN Roxine Caddy, MD       And  . ipratropium  (ATROVENT) nebulizer solution 0.5 mg  0.5 mg Nebulization Q4H PRN Roxine Caddy, MD      . docusate sodium (COLACE) capsule 100 mg  100 mg Oral BID Roxine Caddy, MD      . hydrochlorothiazide (HYDRODIURIL) tablet 25 mg  25 mg Oral Daily Roxine Caddy, MD      . levofloxacin (LEVAQUIN) IVPB 500 mg  500 mg Intravenous Q24H Roxine Caddy, MD      . metoprolol succinate (TOPROL-XL) 24 hr tablet 25 mg  25 mg Oral Daily Roxine Caddy, MD      . pantoprazole (PROTONIX) injection 40 mg  40 mg Intravenous QHS Roxine Caddy, MD      . piperacillin-tazobactam (ZOSYN) IVPB 3.375 g  3.375 g Intravenous Q8H [employer], PHARMD      . predniSONE (DELTASONE) tablet 4 mg  4 mg Oral Daily Dana P Albon, MD      . sodium chloride 0.9 % injection 3 mL  3 mL Intravenous Q12H Elwyn Lade Albon, MD      . sodium chloride 0.9 % injection 3 mL  3 mL Intravenous PRN Roxine Caddy, MD      . vancomycin (VANCOCIN) 500 mg in sodium chloride 0.9 % 100 mL IVPB  500 mg Intravenous Q12H Crystal Stillinger Robertson, PHARMD      . vancomycin (VANCOCIN) IVPB 1000 mg/200 mL premix  1,000 mg Intravenous Once [employer], PHARMD        Allergies: Allergies  Allergen Reactions  . Actonel (Risedronate Sodium)   . Ivp Dye (Iodinated Diagnostic Agents) Nausea And Vomiting    Review of Systems: Cardiac Review of Systems: Y or N  Chest Pain [ n ] Resting SOB [ y ] Exertional SOB [ y ]  Pedal Edema [ n ] Palpitations [ n] Syncope [ n ] Presyncope [ n ]  General Review of Systems: [Y] = yes [ ] =no  Constitional: recent weight change [yes-30 lbs since December ]; anorexia [ ] ; fatigue [ y ]; nausea [ n]; night sweats [ n]; fever [n ]; or chills [n ];  Eye : blurred vision [n ]; diplopia [ n]; vision changes [n ]; Amaurosis fugax[ n ];  Resp: cough [place] ]; wheezing[ n]; hemoptysis[ n]; shortness of breath[ y]; paroxysmal nocturnal dyspnea[ n]; dyspnea on exertion[ n];  GI: gallstones[n ], vomiting[ n]; dysphagia[  y]; melena[ n]; hematochezia [n ]; heartburn[n ];  GU: kidney stones [ n]; hematuria[n ]; dysuria [ n]; nocturia[n ]; history of obstruction [ n ];  Musculosketetal: myalgias[n ]; joint swelling[ n ]; joint erythema[n ];  Heme/Lymph: bruising[n ]; bleeding[ n];  Neuro: TIA[ n]; stroke[n ]; vertigo[n ]; seizures[n ]; paresthesias[n ];  Psych:depression[ n]; anxiety[ n];  Endocrine: diabetes[ n];  Immunizations: Flu [n ]; Pneumococcal[ n ];    Ht 5' 1.81" (1.57 m)  Wt 70.8 kg (156 lb 1.4 oz)  BMI 28.72 kg/m2  Physical Exam: General appearance: alert, cooperative and no distress Neurologic: intact Heart: regular rate and rhythm Lungs: diminished breath sounds RLL Abdomen: soft, non-tender; bowel sounds normal; no masses,  no organomegaly Extremities: Trace LE edema Wound: Well healed, no sign of infection  Diagnostic Studies and Lab Results: No results found for this or any previous visit (from the past 48 hour(s)). Dg Chest 1 View  07/25/2012  *RADIOLOGY REPORT*  Clinical Data: Status post right thoracentesis  CHEST - 1 VIEW  Comparison: 07/22/2012  Findings: No pneumothorax is seen status post right thoracentesis.  Small right pleural effusion, possibly loculated, stable versus mildly decreased.  Prior fluid level is not well visualized on the current study.  Associated patchy right lower lobe opacity, atelectasis versus pneumonia.  Mild cardiomegaly.  Left subclavian pacemaker.  IMPRESSION: No pneumothorax is seen status post right thoracentesis.  Otherwise grossly unchanged.   Original Report Authenticated By: Charline Bills, M.D.    US Thoracentesis Asp Pleural Space W/img Guide  07/25/2012  *RADIOLOGY REPORT*  Clinical Data:  Patient with prior history of right VATS secondary to loculated effusion/empyema. She presents today for diagnostic/therapeutic right thoracentesis.  ULTRASOUND GUIDED DIAGNOSTIC AND THERAPEUTIC RIGHT  THORACENTESIS  An ultrasound guided thoracentesis was  thoroughly discussed with the patient and questions answered.  The benefits, risks, alternatives and complications were also discussed.  The patient understands and wishes to proceed with the procedure.  Written consent was obtained.  Ultrasound was performed to localize and mark an adequate pocket of fluid in the right chest.  The area was then prepped and draped in the normal sterile fashion.  1% Lidocaine was used for local anesthesia.  Under ultrasound guidance a 19 gauge Yueh catheter was introduced.  Thoracentesis was performed.  The catheter was removed and a dressing applied.  Complications:  none  Findings: A total of approximately 65 cc's of thick,purulent reddish-brown fluid was removed. A fluid sample was sent for laboratory analysis. Secondary to the multi-loculated nature of the collection, only the above amount of fluid could be aspirated at this time.  IMPRESSION: Successful ultrasound guided diagnostic and therapeutic right thoracentesis yielding 65 cc's of pleural fluid.  Read by: Jeananne Rama, P.A.-C   Original Report Authenticated By: Tacey Ruiz, MD     Impression and Plan: 1.Megaesophagus-Speech pathology for dietary recommendations 2.LES stenosis-s/p balloon dilatation 1/13 3.Probable RLL PNA and possible empyema secondary to recurrent aspiration. Continue antibiotics. May need IR to place drainage catheter   ZIMMERMAN,DONIELLE MPA-C 07/25/2012,2:57 PM   Await cultures on thoracentesis, patient has improved since seen on 1/11. She is not sure why she is in hospital. With the question rt lower chest fluid, would agree with ct guided placement of drainage cath.   I have seen and examined [name] and agree with the above assessment  and plan.  Delight Ovens MD Beeper [phone] Office [phone] 07/25/2012 6:55 PM

## 2012-07-25 NOTE — Procedures (Signed)
US guided diagnostic/therapeutic right thoracentesis performed yielding 65 cc's thick, purulent reddish- brown fluid. The fluid was sent to the lab for preordered studies. F/u CXR pending. No immediate complications. Dr. Frederico Hamman was notified of the above findings.

## 2012-07-25 NOTE — Progress Notes (Signed)
ANTIBIOTIC CONSULT NOTE - INITIAL  Pharmacy Consult for Vanco/Zosyn Indication: Empyema  Allergies  Allergen Reactions  . Actonel (Risedronate Sodium)   . Ivp Dye (Iodinated Diagnostic Agents) Nausea And Vomiting    Patient Measurements:  IBW=47.8 kg, ABW 70.8 kg Adjusted Body Weight: 57 kg   Vital Signs: BP: 128/68 mmHg (01/14 1153) Intake/Output from previous day:   Intake/Output from this shift:    Labs: No results found for this basename: WBC:3,HGB:3,PLT:3,LABCREA:3,CREATININE:3 in the last 72 hours The CrCl is unknown because both a height and weight (above a minimum accepted value) are required for this calculation. No results found for this basename: VANCOTROUGH:2,VANCOPEAK:2,VANCORANDOM:2,GENTTROUGH:2,GENTPEAK:2,GENTRANDOM:2,TOBRATROUGH:2,TOBRAPEAK:2,TOBRARND:2,AMIKACINPEAK:2,AMIKACINTROU:2,AMIKACIN:2, in the last 72 hours   Microbiology: No results found for this or any previous visit (from the past 720 hour(s)).  Medical History: Past Medical History  Diagnosis Date  . GERD (gastroesophageal reflux disease)   . Cataract   . Blood transfusion without reported diagnosis   . PNA (pneumonia)   . Empyema, right     VATS 06/23/2012 cultures negative to date CXR 07/19/12 persistent airfluid levels  . Polymyalgia rheumatica     on chronic Prednisone 5mg  daily  . Sleep apnea     CPAP at night  . Pacemaker     medtronic  . Neuromuscular disorder     polymyalgia rheumatica  . Arthritis    Assessment: Recent hospitalization 12/13 for aspiration PNA and megaesophagus. EGD yesterday 1/13 for balloon dilation for dysphagia. Pt coughing reddish pink sputum. 1/14: US guided diagnostic/therapeutic right thoracentesis performed yielding 65 cc's thick, purulent reddish- brown fluid. Treat for empyema. Currently afebrile. WBC 11.3. Scr 0.5.  Goal of Therapy:  Vancomycin trough level 15-20 mcg/ml  Plan:  Levaquin 500mg  IV q24h Zosyn 3.375g IV q8hr. Vanco 1g IV x1 then  500mg  IV q12hrs. Trough after 3-5 doses at steady state.   Laikynn Pollio S. Merilynn Finland, PharmD, BCPS Clinical Staff Pharmacist Pager [phone]  Misty Stanley Stillinger 07/25/2012,2:20 PM

## 2012-07-25 NOTE — Interval H&P Note (Cosign Needed)
History and Physical Interval Note:  07/25/2012 4:55 PM  [name]  Has been admitted with findings c/w recurrent right empyema. Hx of recent events and procedures noted. Thoracentesis earlier today finds purulent thick fluid. PCCM and CVTS have evaluated pt and now request IR to place drain to right chest for empyema. The various methods of treatment have been discussed with the patient and family. After consideration of risks, benefits and other options for treatment, the patient has consented to CT guided right chest drain placement. The patient's history has been reviewed, patient examined, no change in status, stable for surgery.  I have reviewed the patient's chart and labs.  Questions were answered to the patient's satisfaction.   BP 126/63  Pulse 102  Temp 98.3 F (36.8 C) (Oral)  Resp 20  Ht 5\' 1"  (1.549 m)  Wt 149 lb 11.1 oz (67.9 kg)  BMI 28.28 kg/m2  SpO2 94% ENT: nl airway Lungs: diminished Rt BS, clear Lt Heart: Reg  Labs pending, including coags. Reviewed with IR MD Will plan for CT guided right chest tube/drain tomorrow. NPO p MN Discussed procedure with pt and daughter in detail, including use of sedation. Consent signed in chart  Seen for Dr. Vernice [facility]. Cyndia Diver, [facility] PA-C 4:58 PM

## 2012-07-25 NOTE — H&P (Signed)
PULMONARY  / CRITICAL CARE MEDICINE  Name: [name] MRN: [id number] DOB: 1928-07-14    LOS: 0  REFERRING PROVIDER:  Dr. Frederico Hamman  CHIEF COMPLAINT:  Empyema  BRIEF PATIENT DESCRIPTION: 79 y/o F with PMH of polymyalgia rheumatica on chronic steroids as well as pacemaker who was last seen in office 1/8 for follow up of  RLL PNA and empyema . [name] was diagnosed with aspiration pneumonia secondary to esophageal dysmotility on 06/19/2012 and underwent VATS for empyema on 06/23/2012.  Seen for follow up 1/8 and c/o dyspnea, cough.  Had been on Levaquin for symptoms.  CT eval 1/9 with concern for empyema with air fluid levels.  1/13 underwent EGD for esophageal dilation per Arlyce Dice.  1/14 had R thoracentesis with purulent fluid drained.  Admitted to [facility] on 1/14 for CVTS eval of R chest.    LINES / TUBES:   CULTURES: 1/14 Plural Fluid>>>  ANTIBIOTICS: Vanco 1/14>>> Zosyn 1/14>>>    SIGNIFICANT EVENTS:  1/14 - Admit with reoccurance of R empyema  LEVEL OF CARE:  Floor PRIMARY SERVICE:  PCCM CONSULTANTS:  CVTS CODE STATUS:  Full Code DIET:  PO DVT Px: SCD's GI Px:  None indicated  HISTORY OF PRESENT ILLNESS:  79 y.o. female with past medical history polymyalgia rheumatica on chronic steroids as well as pacemaker who was last seen in office 1/8 for follow up of  RLL PNA and empyema . Ms. [name] was diagnosed with aspiration pneumonia secondary to esophageal dysmotility on 06/19/2012 and underwent VATS for empyema on 06/23/2012. Thoracentesis on 12/12 demonstrated an exudutive effusion/empyema and she underwent intubation and VATS procedure on 12/13. She required intubation and mechanical ventilation for 1 day and was extubated on 12/14. She had a chest tube in place from 12/13 to 12/16 which was removed without complication. She received antibiotics for 10 days, 7 days IV while hospitalized and 3 days oral. She tolerated the antibiotics well with no side effects. At discharge she required  oxygen via nasal canula, 3L with rest and exertion due to respiratory insufficiency. During exertion, she desaturates to high 70s via pulse oximetry. Cultures from her thoracentesis and her VATs remain negative to date for bacteria, AFB, fungi and yeast. At time of follow up on 1/8 in she complained of generalized weakness, dyspnea at rest but predominantly with exertion, cough productive of green sputum and hemoptysis (streaks associated with forceful cough). Also, persistent RLE edema - [place] negative.  She was seen in Dr. Dennie Maizes office 07/16/2012 and due to the above symptoms and fever, Levaquin & augmentin was initiated. CXR performed at that time showed persistent pleural effusion with air fluid levels and RLL PNA. Underwent CTA of Chest 1/9 which demonstrated no PE, no PTX but did note loculated R basilar pleural effusion which contained air fluid levels, persistent RLL consolidation, concern for BP fistual, small pericardial effusion.  Was then sent for IR Thoracentesis to sample fluid on 1/14 and fluid noted to be thick purulent reddish-brown fluid- approx 65 cc.  Also is known to have GERD, marked hiatal hernia with diffuse esophageal distention on CT 1/9.  She also had EGD on 1/13 for balloon dilation of esophagus on 1/13.  Indicates she has difficulty with swallowing, chokes, coughs with food.  Has been on a liquid diet since her admit in December and has lost approx 30 lbs.  She was called for direct admit to [facility].       PAST MEDICAL HISTORY :  Past Medical History  Diagnosis Date  . GERD (gastroesophageal reflux disease)   . Cataract   . Blood transfusion without reported diagnosis   . PNA (pneumonia)   . Empyema, right     VATS 06/23/2012 cultures negative to date CXR 07/19/12 persistent airfluid levels  . Polymyalgia rheumatica     on chronic Prednisone 5mg  daily  . Sleep apnea     CPAP at night  . Pacemaker     medtronic  . Neuromuscular disorder     polymyalgia rheumatica  .  Arthritis    Past Surgical History  Procedure Date  . Appendectomy   . Video bronchoscopy 06/23/2012    Procedure: VIDEO BRONCHOSCOPY;  Surgeon: Delight Ovens, MD;  Location: [facility] OR;  Service: Thoracic;  Laterality: N/A;  . Video assisted thoracoscopy (vats)/decortication 06/23/2012    Procedure: VIDEO ASSISTED THORACOSCOPY (VATS)/DECORTICATION;  Surgeon: Delight Ovens, MD;  Location: [facility] OR;  Service: Thoracic;  Laterality: Right;  . Esophagogastroduodenoscopy (egd) with esophageal dilation 06/27/2012    Procedure: ESOPHAGOGASTRODUODENOSCOPY (EGD) WITH ESOPHAGEAL DILATION;  Surgeon: Meryl Dare, MD,FACG;  Location: [facility] ENDOSCOPY;  Service: Endoscopy;  Laterality: N/A;  . Video assisted thoracoscopy (vats)/empyema     06/23/2012  . Eye surgery     bilateral cataract removal  . Joint replacement     right hip  . Esophagogastroduodenoscopy 07/24/2012    Procedure: ESOPHAGOGASTRODUODENOSCOPY (EGD);  Surgeon: Louis Meckel, MD;  Location: [facility] ENDOSCOPY;  Service: Endoscopy;  Laterality: N/A;  . Balloon dilation 07/24/2012    Procedure: BALLOON DILATION;  Surgeon: Louis Meckel, MD;  Location: [facility] ENDOSCOPY;  Service: Endoscopy;  Laterality: N/A;   Prior to Admission medications   Medication Sig Start Date End Date Taking? Authorizing Provider  albuterol (PROVENTIL HFA;VENTOLIN HFA) 108 (90 BASE) MCG/ACT inhaler Inhale 2 puffs into the lungs every 6 (six) hours as needed for wheezing. 06/28/12   Renae Fickle, MD  amoxicillin-clavulanate (AUGMENTIN) 875-125 MG per tablet Take 1 tablet by mouth 2 (two) times daily. 07/19/12   Roxine Caddy, MD  b complex vitamins tablet Take 1 tablet by mouth daily.    Historical Provider, MD  docusate sodium 100 MG CAPS Take 100 mg by mouth 2 (two) times daily. 06/28/12   Renae Fickle, MD  hydrochlorothiazide (HYDRODIURIL) 25 MG tablet Take 25 mg by mouth daily.    Historical Provider, MD  levofloxacin (LEVAQUIN) 500 MG tablet Take 1 tablet (500 mg  total) by mouth daily. 07/19/12   Roxine Caddy, MD  metoprolol succinate (TOPROL-XL) 25 MG 24 hr tablet Take 25 mg by mouth daily. Verified that patient takes XL formulation by medication bottle.    Historical Provider, MD  omeprazole (PRILOSEC) 40 MG capsule Take 1 capsule (40 mg total) by mouth daily. 06/28/12   Renae Fickle, MD  predniSONE (DELTASONE) 5 MG tablet Take 4 mg by mouth daily.     Historical Provider, MD  Vitamins A & D (VITAMIN A & D PO) Take 1 capsule by mouth daily.    Historical Provider, MD   Allergies  Allergen Reactions  . Actonel (Risedronate Sodium)   . Ivp Dye (Iodinated Diagnostic Agents) Nausea And Vomiting    FAMILY HISTORY:  Family History  Problem Relation Age of Onset  . Arthritis Father   . Heart disease Father   . Cancer Brother   . Heart disease Brother   . Diabetes Son    SOCIAL HISTORY:  reports that she has never smoked. She has never used smokeless  tobacco. She reports that she drinks about 1.8 ounces of alcohol per week. She reports that she does not use illicit drugs.  REVIEW OF SYSTEMS:   Constitutional: Negative for fever, and diaphoresis.  Indicates weight loss while on liquid diet, fatigue and chills.  HENT: Negative for hearing loss, ear pain, nosebleeds, congestion, sore throat, neck pain, tinnitus and ear discharge.   Eyes: Negative for blurred vision, double vision, photophobia, pain, discharge and redness.  Respiratory: Negative for  hemoptysis, sputum production,  wheezing and stridor.  Mild cough, shortness of breath, right pleuritic chest pain.  Cardiovascular: Negative for chest pain, palpitations, orthopnea, claudication, leg swelling and PND.  Gastrointestinal: Negative for heartburn, nausea, vomiting, abdominal pain, diarrhea, constipation, blood in stool and melena.  Genitourinary: Negative for dysuria, urgency, frequency, hematuria and flank pain.  Musculoskeletal: Negative for myalgias, back pain, joint pain and falls.    Skin: Negative for itching and rash.  Neurological: Negative for dizziness, tingling, tremors, sensory change, speech change, focal weakness, seizures, loss of consciousness, weakness and headaches.  Endo/Heme/Allergies: Negative for environmental allergies and polydipsia. Does not bruise/bleed easily.  INTERVAL HISTORY: No acute distress, sitting up in bed.   VITAL SIGNS: BP: (128-143)/(67-68) 128/68 mmHg (01/14 1153) Weight:  [156 lb 1.4 oz (70.8 kg)] 156 lb 1.4 oz (70.8 kg) (01/14 1300)  PHYSICAL EXAMINATION: General:  Elderly female in NAD, appears younger than stated age Neuro:  AAOx4, speech clear, MAE3 HEENT:  Mm pink/moist, no jvd Cardiovascular:  s1s2 rrr, no m/r/g Lungs:  resp's even/non-labored, lungs bilaterally clear but significantly diminished on R.  Posterior/lateral surgical scar on R from VATS well healed Abdomen:  Round/soft, bsx4 active Musculoskeletal:  No acute deformities Skin:  Warm/dry, R>L LE edema (hx of vein stripping on R, neg doppler 1/9) unchanged / chronic   Lab 07/22/12 1210 07/19/12 1534  NA 135 135  K 3.3* 3.8  CL 94* 93*  CO2 31 36*  BUN 20 12  CREATININE 0.50 0.6  GLUCOSE 168* 137*    Lab 07/22/12 1250 07/19/12 1534  HGB 10.8* 11.2*  HCT 33.3* 33.5*  WBC 11.3* 14.5*  PLT 298 318.0   Dg Chest 1 View  07/25/2012  *RADIOLOGY REPORT*  Clinical Data: Status post right thoracentesis  CHEST - 1 VIEW  Comparison: 07/22/2012  Findings: No pneumothorax is seen status post right thoracentesis.  Small right pleural effusion, possibly loculated, stable versus mildly decreased.  Prior fluid level is not well visualized on the current study.  Associated patchy right lower lobe opacity, atelectasis versus pneumonia.  Mild cardiomegaly.  Left subclavian pacemaker.  IMPRESSION: No pneumothorax is seen status post right thoracentesis.  Otherwise grossly unchanged.   Original Report Authenticated By: Charline Bills, M.D.    US Thoracentesis Asp Pleural  Space W/img Guide  07/25/2012  *RADIOLOGY REPORT*  Clinical Data:  Patient with prior history of right VATS secondary to loculated effusion/empyema. She presents today for diagnostic/therapeutic right thoracentesis.  ULTRASOUND GUIDED DIAGNOSTIC AND THERAPEUTIC RIGHT  THORACENTESIS  An ultrasound guided thoracentesis was thoroughly discussed with the patient and questions answered.  The benefits, risks, alternatives and complications were also discussed.  The patient understands and wishes to proceed with the procedure.  Written consent was obtained.  Ultrasound was performed to localize and mark an adequate pocket of fluid in the right chest.  The area was then prepped and draped in the normal sterile fashion.  1% Lidocaine was used for local anesthesia.  Under ultrasound guidance a 19 gauge Griffith Citron  catheter was introduced.  Thoracentesis was performed.  The catheter was removed and a dressing applied.  Complications:  none  Findings: A total of approximately 65 cc's of thick,purulent reddish-brown fluid was removed. A fluid sample was sent for laboratory analysis. Secondary to the multi-loculated nature of the collection, only the above amount of fluid could be aspirated at this time.  IMPRESSION: Successful ultrasound guided diagnostic and therapeutic right thoracentesis yielding 65 cc's of pleural fluid.  Read by: Jeananne Rama, P.A.-C   Original Report Authenticated By: Tacey Ruiz, MD     ASSESSMENT / PLAN:  R Empyema  RLL Colsolidation Chronic Aspiration Megaesophagus LES stenosis s/p dilation 1/13 Complicated R empyema s/p VATS 06/2012 per Dr. Tyrone Sage with re-accumulation of fluid with gas / air fluid levels.  1/14 Thoracentesis with 65 ml thick pus returned.    Plan: -Speech therapy evaluation for dietary recommendations.  Has been on a liquid diet since discharge in Dec  -IV abx as above -CT guided placement of pigtail catheter per IR -Albuterol /atrovent  Hypertension  Plan: -Continue  home metoprolol, HCTZ  Polymyalgia Rheumatica On chronic prednisone 4mg  daily  Plan: -Continue prednisone  Canary Brim, NP-C Pulmonary and Critical Care Medicine [facility] Pager: [phone]  07/25/2012, 2:59 PM  Will admit to tele, CVTS consult appreciated, will have radiology place a CT guided small bore drain, continue broad spectrum abx and culture f/u.  Patient seen and examined, agree with above note.  I dictated the care and orders written for this patient under my direction.  Koren Bound, M.D. [phone]

## 2012-07-26 ENCOUNTER — Inpatient Hospital Stay (HOSPITAL_COMMUNITY): Payer: Medicare Other

## 2012-07-26 DIAGNOSIS — T17908A Unspecified foreign body in respiratory tract, part unspecified causing other injury, initial encounter: Secondary | ICD-10-CM

## 2012-07-26 LAB — BASIC METABOLIC PANEL
Chloride: 99 mEq/L (ref 96–112)
GFR calc Af Amer: >90 mL/min (ref >90)
GFR calc non Af Amer: 87 mL/min — ABNORMAL LOW (ref >90)
Potassium: 3.5 mEq/L (ref 3.5–5.1)
Sodium: 138 mEq/L (ref 135–145)

## 2012-07-26 LAB — CBC
HCT: 34.1 % — ABNORMAL LOW (ref 36.0–46.0)
MCHC: 32.8 g/dL (ref 30.0–36.0)
RDW: 15.2 % (ref 11.5–15.5)
WBC: 7.9 10*3/uL (ref 4.0–10.5)

## 2012-07-26 LAB — LEGIONELLA ANTIGEN, URINE

## 2012-07-26 MED ORDER — FENTANYL CITRATE 0.05 MG/ML IJ SOLN: 25.0000 ug | Freq: Four times a day (QID) | INTRAMUSCULAR | Status: DC | PRN

## 2012-07-26 MED ORDER — FENTANYL CITRATE 0.05 MG/ML IJ SOLN
INTRAMUSCULAR | Status: AC | PRN
  Administered 2012-07-26: 25 ug via INTRAVENOUS
  Administered 2012-07-26: 50 ug via INTRAVENOUS
  Administered 2012-07-26: 25 ug via INTRAVENOUS

## 2012-07-26 MED ORDER — MIDAZOLAM HCL 2 MG/2ML IJ SOLN
INTRAMUSCULAR | Status: AC
  Filled 2012-07-26: qty 4

## 2012-07-26 MED ORDER — PANTOPRAZOLE SODIUM 40 MG PO TBEC
40.0000 mg | DELAYED_RELEASE_TABLET | Freq: Every day | ORAL | Status: DC
  Administered 2012-07-26 – 2012-07-31 (×6): 40 mg via ORAL
  Filled 2012-07-26 (×6): qty 1

## 2012-07-26 MED ORDER — OXYCODONE-ACETAMINOPHEN 5-325 MG PO TABS
1.0000 | ORAL_TABLET | Freq: Four times a day (QID) | ORAL | Status: DC | PRN
  Administered 2012-07-26 – 2012-07-28 (×2): 1 via ORAL
  Filled 2012-07-26 (×3): qty 1

## 2012-07-26 MED ORDER — ACETAMINOPHEN 325 MG PO TABS: 650.0000 mg | ORAL_TABLET | Freq: Four times a day (QID) | ORAL | Status: DC | PRN

## 2012-07-26 MED ORDER — FENTANYL CITRATE 0.05 MG/ML IJ SOLN
INTRAMUSCULAR | Status: AC
  Filled 2012-07-26: qty 4

## 2012-07-26 MED ORDER — MIDAZOLAM HCL 2 MG/2ML IJ SOLN
INTRAMUSCULAR | Status: AC | PRN
  Administered 2012-07-26 (×2): 1 mg via INTRAVENOUS

## 2012-07-26 NOTE — Progress Notes (Signed)
Utilization Review Completed.[name] T1/15/2014   

## 2012-07-26 NOTE — Progress Notes (Signed)
Placed pt on cpap per home use, pt tolerates well.

## 2012-07-26 NOTE — Progress Notes (Signed)
Speech Language Pathology  Patient Details Name: [name] MRN: [id number] DOB: 18-Jan-1929 Today's Date: 07/26/2012 Time:  -    Attempted to see for swallow assessment.  Pt. is NPO for procedure today (chest tube).  SLP will evaluate as soon as able.        Breck Coons [place].Ed [employer] [phone]  07/26/2012

## 2012-07-26 NOTE — Progress Notes (Signed)
PULMONARY  / CRITICAL CARE MEDICINE  Name: [name] MRN: [id number] DOB: [birth year]    LOS: 1   CHIEF COMPLAINT: Empyema   BRIEF PATIENT DESCRIPTION:  79 y/o F with PMH of polymyalgia rheumatica on chronic steroids as well as pacemaker who was last seen in office 1/8 for follow up of RLL PNA and empyema . [name] was diagnosed with aspiration pneumonia secondary to esophageal dysmotility on 06/19/2012 and underwent VATS for empyema on 06/23/2012. Seen for follow up 1/8 and c/o dyspnea, cough. Had been on Levaquin for symptoms. CT eval 1/9 with concern for empyema with air fluid levels. 1/13 underwent EGD for esophageal dilation per Arlyce Dice. 1/14 had R thoracentesis with purulent fluid drained. Admitted to [facility] on 1/14 for CVTS eval of R chest.    LINES / TUBES: Rt chest tube (IR) 1/15 >>  CULTURES: Blood 1/14 >>  Pleural fluid 1/15 >>  ANTIBIOTICS: Vancomycin 1/14 >> Zosyn 1/14 >>  Levaquin 1/14 >>  SIGNIFICANT EVENTS:  1/14 - Admit with reoccurance of R empyema 1/14 - Rt thoracentesis >> Protein 3.6, LDH > 10,000, Amylase < 3, Albumin 1.2, 83% neutrophils 1/15 - IR pleural fluid drain   INTERVAL HISTORY:  Feels weak.  Denies chest pain.  Not as much cough/sputum.  VITAL SIGNS: Temp:  [98.3 F (36.8 C)-98.5 F (36.9 C)] 98.3 F (36.8 C) (01/15 0444) Pulse Rate:  [81-102] 83  (01/15 1130) Resp:  [18-29] 22  (01/15 1130) BP: (110-140)/(58-72) 126/62 mmHg (01/15 1130) SpO2:  [94 %-100 %] 100 % (01/15 1130) Weight:  [149 lb 11.1 oz (67.9 kg)-156 lb 1.4 oz (70.8 kg)] 150 lb 3.2 oz (68.13 kg) (01/15 0444)  PHYSICAL EXAMINATION: General:  No distress Neuro:  Normal strength HEENT:  No sinus tenderness Cardiovascular:  s1s2 regular, no murmur Lungs:  Decreased breath sounds, no wheeze, Rt chest tube in place Abdomen:  Soft, non tender Musculoskeletal:  No edema Skin:  No rashes   Lab 07/26/12 0450 07/25/12 1804 07/22/12 1210  NA 138 136 135  K 3.5 2.8* 3.3*    CL 99 96 94*  CO2 31 30 31   BUN 10 11 20   CREATININE 0.53 0.53 0.50  GLUCOSE 109* 160* 168*    Lab 07/26/12 0450 07/25/12 1804 07/22/12 1250  HGB 11.2* 11.7* 10.8*  HCT 34.1* 35.4* 33.3*  WBC 7.9 9.8 11.3*  PLT 211 224 298   Dg Chest 1 View  07/25/2012  *RADIOLOGY REPORT*  Clinical Data: Status post right thoracentesis  CHEST - 1 VIEW  Comparison: 07/22/2012  Findings: No pneumothorax is seen status post right thoracentesis.  Small right pleural effusion, possibly loculated, stable versus mildly decreased.  Prior fluid level is not well visualized on the current study.  Associated patchy right lower lobe opacity, atelectasis versus pneumonia.  Mild cardiomegaly.  Left subclavian pacemaker.  IMPRESSION: No pneumothorax is seen status post right thoracentesis.  Otherwise grossly unchanged.   Original Report Authenticated By: Charline Bills, M.D.    Dg Chest Port 1 View  07/26/2012  *RADIOLOGY REPORT*  Clinical Data: Follow-up empyema  PORTABLE CHEST - 1 VIEW  Comparison: 07/25/2012  Findings: Persistent right basilar opacity consistent with pneumonia, atelectasis or a combination.  The lungs are otherwise clear with no new lung opacities.  There is no pneumothorax.  The cardiac silhouette is normal in size.  No mediastinal or hilar mass or adenopathy.  Stable, well-positioned left anterior chest wall sequential pacemaker.  IMPRESSION: No change from the previous day's  study.  Persistent right lung base consolidation/atelectasis.  No pneumothorax.   Original Report Authenticated By: Amie Portland, M.D.    US Thoracentesis Asp Pleural Space W/img Guide  07/25/2012  *RADIOLOGY REPORT*  Clinical Data:  Patient with prior history of right VATS secondary to loculated effusion/empyema. She presents today for diagnostic/therapeutic right thoracentesis.  ULTRASOUND GUIDED DIAGNOSTIC AND THERAPEUTIC RIGHT  THORACENTESIS  An ultrasound guided thoracentesis was thoroughly discussed with the patient and  questions answered.  The benefits, risks, alternatives and complications were also discussed.  The patient understands and wishes to proceed with the procedure.  Written consent was obtained.  Ultrasound was performed to localize and mark an adequate pocket of fluid in the right chest.  The area was then prepped and draped in the normal sterile fashion.  1% Lidocaine was used for local anesthesia.  Under ultrasound guidance a 19 gauge Yueh catheter was introduced.  Thoracentesis was performed.  The catheter was removed and a dressing applied.  Complications:  none  Findings: A total of approximately 65 cc's of thick,purulent reddish-brown fluid was removed. A fluid sample was sent for laboratory analysis. Secondary to the multi-loculated nature of the collection, only the above amount of fluid could be aspirated at this time.  IMPRESSION: Successful ultrasound guided diagnostic and therapeutic right thoracentesis yielding 65 cc's of pleural fluid.  Read by: Jeananne Rama, P.A.-C   Original Report Authenticated By: Tacey Ruiz, MD     ASSESSMENT / PLAN:  Aspiration pneumonia with RLL pneumonia and empyema. S/p Rt VATS decortication 06/23/12.  Appreciate assistance from Dr. Tyrone Sage. Plan: F/u CXR D2/x vancomycin, zosyn, levaquin Continue chest tube to suction for now F/u pleural fluid results PRN BD's  Hx of OSA. Plan: CPAP qhs  Mega-esophagus with stenosis of GE junction s/p dilation. Had EGD with [facility] 07/24/12. Plan: F/u with speech therapy and then adjust diet Continue protonix  Hx of HTN. Plan: Continue HCTZ, metoprolol  Hx of polymyalgia rheumatica Plan: Continue prednisone  Coralyn Helling, MD [facility] Pulmonary/Critical Care 07/26/2012, 12:44 PM Pager:  [phone] After 3pm call: [phone]

## 2012-07-26 NOTE — Progress Notes (Addendum)
                   [address]            [name] [address]          [phone]         Subjective: Patient denies cough. States she ate without much difficulty. Does have complaints of feeling weak overall  Objective: Vital signs in last 24 hours: Temp:  [98.3 F (36.8 C)-98.6 F (37 C)] 98.6 F (37 C) (01/15 1347) Pulse Rate:  [81-102] 93  (01/15 1347) Cardiac Rhythm:  [-] Normal sinus rhythm (01/15 1130) Resp:  [18-29] 20  (01/15 1347) BP: (104-140)/(58-72) 104/68 mmHg (01/15 1347) SpO2:  [93 %-100 %] 94 % (01/15 1347) Weight:  [67.9 kg (149 lb 11.1 oz)-68.13 kg (150 lb 3.2 oz)] 68.13 kg (150 lb 3.2 oz) (01/15 0444)     Intake/Output from previous day: 01/14 0701 - 01/15 0700 In: -  Out: 300 [Urine:300]   Physical Exam:  Cardiovascular: RRR Pulmonary: Diminished at right base; no rales, wheezes, or rhonchi. Abdomen: Soft, non tender, bowel sounds present. Extremities: No lower extremity edema. Wounds: Clean and dry.  No erythema or signs of infection. Chest Tube: Scant output  Lab Results: CBC: Basename 07/26/12 0450 07/25/12 1804  WBC 7.9 9.8  HGB 11.2* 11.7*  HCT 34.1* 35.4*  PLT 211 224   BMET:  Basename 07/26/12 0450 07/25/12 1804  NA 138 136  K 3.5 2.8*  CL 99 96  CO2 31 30  GLUCOSE 109* 160*  BUN 10 11  CREATININE 0.53 0.53  CALCIUM 9.4 9.6    PT/INR:  Basename 07/25/12 1804  LABPROT 13.7  INR 1.06   ABG:  INR: Will add last result for INR, ABG once components are confirmed Will add last 4 CBG results once components are confirmed  Assessment/Plan:  1. CV - SR 2.  Pulmonary - CXR this am showed stable,persistent right opacity, no pneumothorax. Underwent a CT guided 5 French pigtail chest tube into the right chest (complex pleural collection) earlier. Day 2 of  Levaquin, Zosyn, and Vancomycin. Gram stain from right thoracentesis showed no organisms and few WBC.  ZIMMERMAN,DONIELLE MPA-C 07/26/2012,2:17 PM     Very small amount from chest tube, serosanguinous does not look purulent cultures pending I have seen and examined Melissa Noon and agree with the above assessment  and plan. Will need barium swallow before going home . Says she is swallowing better since dilation two days ago  Delight Ovens MD Beeper [phone] Office [phone] 07/26/2012 7:28 PM

## 2012-07-26 NOTE — Procedures (Signed)
Placement of 12 Fr tube in right lung empyema.  Bloody fluid was aspirated.  No immediate complication.

## 2012-07-27 ENCOUNTER — Ambulatory Visit: Payer: Self-pay | Admitting: Cardiothoracic Surgery

## 2012-07-27 ENCOUNTER — Inpatient Hospital Stay (HOSPITAL_COMMUNITY): Payer: Medicare Other

## 2012-07-27 LAB — CBC
HCT: 33.1 % — ABNORMAL LOW (ref 36.0–46.0)
Hemoglobin: 10.8 g/dL — ABNORMAL LOW (ref 12.0–15.0)
MCHC: 32.6 g/dL (ref 30.0–36.0)
RBC: 3.53 MIL/uL — ABNORMAL LOW (ref 3.87–5.11)
WBC: 6 10*3/uL (ref 4.0–10.5)

## 2012-07-27 LAB — BASIC METABOLIC PANEL
BUN: 11 mg/dL (ref 6–23)
Chloride: 101 mEq/L (ref 96–112)
GFR calc Af Amer: >90 mL/min (ref >90)
GFR calc non Af Amer: 81 mL/min — ABNORMAL LOW (ref >90)
Potassium: 3.5 mEq/L (ref 3.5–5.1)
Sodium: 140 mEq/L (ref 135–145)

## 2012-07-27 NOTE — Progress Notes (Signed)
PULMONARY  / CRITICAL CARE MEDICINE  Name: [name] MRN: [id number] DOB: 1928-12-04    LOS: 2   CHIEF COMPLAINT: Empyema   BRIEF PATIENT DESCRIPTION:  79 y/o F with PMH of polymyalgia rheumatica on chronic steroids as well as pacemaker who was last seen in office 1/8 for follow up of RLL PNA and empyema . [name] was diagnosed with aspiration pneumonia secondary to esophageal dysmotility on 06/19/2012 and underwent VATS for empyema on 06/23/2012. Seen for follow up 1/8 and c/o dyspnea, cough. Had been on Levaquin for symptoms. CT eval 1/9 with concern for empyema with air fluid levels. 1/13 underwent EGD for esophageal dilation per Arlyce Dice. 1/14 had R thoracentesis with purulent fluid drained. Admitted to [facility] on 1/14 for CVTS eval of R chest.    LINES / TUBES: Rt chest tube (IR) 1/15 >>  CULTURES: Blood 1/14 >> Pleural fluid 1/15 >>  ANTIBIOTICS: Vancomycin 1/14 >> Zosyn 1/14 >>  Levaquin 1/14 >>  SIGNIFICANT EVENTS:  1/14 - Admit with reoccurance of R empyema 1/14 - Rt thoracentesis >> Protein 3.6, LDH > 10,000, Amylase < 3, Albumin 1.2, 83% neutrophils 1/15 - IR pleural fluid drain   INTERVAL HISTORY:  Feels okay.  Denies cough.  VITAL SIGNS: Temp:  [97.7 F (36.5 C)-98.6 F (37 C)] 97.7 F (36.5 C) (01/16 0549) Pulse Rate:  [82-93] 85  (01/16 1129) Resp:  [17-24] 17  (01/16 0549) BP: (104-129)/(57-74) 115/69 mmHg (01/16 1129) SpO2:  [92 %-96 %] 92 % (01/16 0549) Weight:  [148 lb 1.6 oz (67.178 kg)] 148 lb 1.6 oz (67.178 kg) (01/16 0549)  PHYSICAL EXAMINATION: General:  No distress Neuro:  Normal strength HEENT:  No sinus tenderness Cardiovascular:  s1s2 regular, no murmur Lungs:  Decreased breath sounds, no wheeze, Rt chest tube in place Abdomen:  Soft, non tender Musculoskeletal:  No edema Skin:  No rashes   Lab 07/27/12 0535 07/26/12 0450 07/25/12 1804  NA 140 138 136  K 3.5 3.5 2.8*  CL 101 99 96  CO2 32 31 30  BUN 11 10 11   CREATININE 0.65  0.53 0.53  GLUCOSE 107* 109* 160*    Lab 07/27/12 0535 07/26/12 0450 07/25/12 1804  HGB 10.8* 11.2* 11.7*  HCT 33.1* 34.1* 35.4*  WBC 6.0 7.9 9.8  PLT 187 211 224   Ct Guided Abscess Drain  07/26/2012  *RADIOLOGY REPORT*  Clinical history:79 year old with a right chest empyema.  PROCEDURE(S): CT GUIDED PLACEMENT OF A RIGHT CHEST TUBE.  Physician: Rachelle Hora. Henn, MD  Medications:Versed 2 mg, Fentanyl 100 mcg. A radiology nurse monitored the patient for moderate sedation.  Moderate sedation time:35 minutes  Procedure:The procedure was explained to the patient.  The risks and benefits of the procedure were discussed and the patient's questions were addressed.  Informed consent was obtained from the patient.  The patient was placed supine on the CT scanner and the right side was slightly elevated.  Images through the lower chest were obtained.  The right mid axillary region was prepped and draped in a sterile fashion.  The skin was anesthetized with lidocaine.  A 5-French Yueh catheter was directed into the pleural space while aspirating.  Thick bloody fluid was aspirated.  A stiff Amplatz wire was placed and position was confirmed with CT.  The tract was dilated and a 12-French multipurpose drain was reconstituted within the pleural space.  Additional bloody fluid was aspirated.  The catheter was sutured to the skin and attached to a Pleur  Evac.  Findings:There is a pleural based air-fluid collection at the right lung base.  The catheter position was confirmed within the complex pleural collection.  Findings are compatible with history of empyema.  Complications: None  Impression:CT guided placement of a pigtail chest tube within the right chest empyema.   Original Report Authenticated By: Richarda Overlie, M.D.    Dg Chest Port 1 View  07/27/2012  *RADIOLOGY REPORT*  Clinical Data: Follow up edema, shortness of breath  PORTABLE CHEST - 1 VIEW  Comparison: CT guided abscess drainage 07/26/2012; prior chest x- ray  07/26/2012  Findings: Interval placement of a pigtail thoracostomy tube in the inferior right pleural space.  Slightly decreased right pleural effusion.  Persistent associated right lower lobe atelectasis. No pneumothorax.  Stable left subclavian approach cardiac rhythm maintenance device leads projecting over the right atrium and right ventricle.  Unchanged cardiomegaly.  The remainder of the lungs are clear.  Dilated and fluid-filled thoracic esophagus.  IMPRESSION:  1.  Interval placement of a right-sided pigtail thoracostomy tube in the right basilar empyema with a slight decrease in the amount of pleural fluid. 2.  Stable right lower lobe atelectasis.   Original Report Authenticated By: Malachy Moan, M.D.    Dg Chest Port 1 View  07/26/2012  *RADIOLOGY REPORT*  Clinical Data: Follow-up empyema  PORTABLE CHEST - 1 VIEW  Comparison: 07/25/2012  Findings: Persistent right basilar opacity consistent with pneumonia, atelectasis or a combination.  The lungs are otherwise clear with no new lung opacities.  There is no pneumothorax.  The cardiac silhouette is normal in size.  No mediastinal or hilar mass or adenopathy.  Stable, well-positioned left anterior chest wall sequential pacemaker.  IMPRESSION: No change from the previous day's study.  Persistent right lung base consolidation/atelectasis.  No pneumothorax.   Original Report Authenticated By: Amie Portland, M.D.     ASSESSMENT / PLAN:  Aspiration pneumonia with RLL pneumonia and empyema. S/p Rt VATS decortication 06/23/12.  Appreciate assistance from Dr. Tyrone Sage.  Very little outpt from chest tube. Plan: F/u CXR D3/x vancomycin, zosyn, levaquin Continue chest tube to suction for now >> may be able to d/c soon F/u pleural fluid results PRN BD's  Hx of OSA. Plan: CPAP qhs  Mega-esophagus with stenosis of GE junction s/p dilation. Had EGD with [facility] 07/24/12.  Seen by speech 1/16. Plan: Regular diet, thin liquids Will need barium  swallow before d/c home >> defer to TCTS Continue protonix  Hx of HTN. Plan: Continue HCTZ, metoprolol  Hx of polymyalgia rheumatica Plan: Continue prednisone  Coralyn Helling, MD [facility] Pulmonary/Critical Care 07/27/2012, 12:49 PM Pager:  [phone] After 3pm call: [phone]

## 2012-07-27 NOTE — Care Management Note (Unsigned)
    Page 1 of 1   07/27/2012     4:31:25 PM   CARE MANAGEMENT NOTE 07/27/2012  Patient:  [name], [name]   Account Number:  [id number]  Date Initiated:  07/27/2012  Documentation initiated by:  Nayvie Lips  Subjective/Objective Assessment:   PT ADM 07/25/12 WITH EMPYEMA.  PTA, PT [facility], WITH HUSBAND.     Action/Plan:   WILL FOLLOW FOR HOME NEEDS AS PT PROGRESSES.   Anticipated DC Date:  07/30/2012   Anticipated DC Plan:  HOME W HOME HEALTH SERVICES      DC Planning Services  CM consult      Choice offered to / List presented to:             Status of service:  In process, will continue to follow Medicare Important Message given?   (If response is "NO", the following Medicare IM given date fields will be blank) Date Medicare IM given:   Date Additional Medicare IM given:    Discharge Disposition:    Per UR Regulation:  Reviewed for med. necessity/level of care/duration of stay  If discussed at Long Length of Stay Meetings, dates discussed:    Comments:

## 2012-07-27 NOTE — Progress Notes (Signed)
Subjective: Pt doing fairly well; still a little weak; no significant CP or increased dyspnea  Objective: Vital signs in last 24 hours: Temp:  [97.7 F (36.5 C)-98.6 F (37 C)] 97.7 F (36.5 C) (01/16 0549) Pulse Rate:  [82-93] 82  (01/16 0549) Resp:  [17-29] 17  (01/16 0549) BP: (104-140)/(57-74) 107/57 mmHg (01/16 0549) SpO2:  [92 %-100 %] 92 % (01/16 0549) Weight:  [148 lb 1.6 oz (67.178 kg)] 148 lb 1.6 oz (67.178 kg) (01/16 0549) Last BM Date: 07/26/12  Intake/Output from previous day: 01/15 0701 - 01/16 0700 In: 1470 [P.O.:720; IV Piggyback:750] Out: 400 [Urine:400] Intake/Output this shift: Total I/O In: 240 [P.O.:240] Out: -   Rt chest drain intact, output minimal amt of bloody fluid, no air leak; insertion site ok; cx's/cytology pending,CXR as below  Lab Results:   Basename 07/27/12 0535 07/26/12 0450  WBC 6.0 7.9  HGB 10.8* 11.2*  HCT 33.1* 34.1*  PLT 187 211   BMET  Basename 07/27/12 0535 07/26/12 0450  NA 140 138  K 3.5 3.5  CL 101 99  CO2 32 31  GLUCOSE 107* 109*  BUN 11 10  CREATININE 0.65 0.53  CALCIUM 9.2 9.4   PT/INR  Basename 07/25/12 1804  LABPROT 13.7  INR 1.06   ABG No results found for this basename: PHART:2,PCO2:2,PO2:2,HCO3:2 in the last 72 hours  Studies/Results: Dg Chest 1 View  07/25/2012  *RADIOLOGY REPORT*  Clinical Data: Status post right thoracentesis  CHEST - 1 VIEW  Comparison: 07/22/2012  Findings: No pneumothorax is seen status post right thoracentesis.  Small right pleural effusion, possibly loculated, stable versus mildly decreased.  Prior fluid level is not well visualized on the current study.  Associated patchy right lower lobe opacity, atelectasis versus pneumonia.  Mild cardiomegaly.  Left subclavian pacemaker.  IMPRESSION: No pneumothorax is seen status post right thoracentesis.  Otherwise grossly unchanged.   Original Report Authenticated By: Charline Bills, M.D.    Ct Guided Abscess Drain  07/26/2012   *RADIOLOGY REPORT*  Clinical history:79 year old with a right chest empyema.  PROCEDURE(S): CT GUIDED PLACEMENT OF A RIGHT CHEST TUBE.  Physician: Rachelle Hora. Henn, MD  Medications:Versed 2 mg, Fentanyl 100 mcg. A radiology nurse monitored the patient for moderate sedation.  Moderate sedation time:35 minutes  Procedure:The procedure was explained to the patient.  The risks and benefits of the procedure were discussed and the patient's questions were addressed.  Informed consent was obtained from the patient.  The patient was placed supine on the CT scanner and the right side was slightly elevated.  Images through the lower chest were obtained.  The right mid axillary region was prepped and draped in a sterile fashion.  The skin was anesthetized with lidocaine.  A 5-French Yueh catheter was directed into the pleural space while aspirating.  Thick bloody fluid was aspirated.  A stiff Amplatz wire was placed and position was confirmed with CT.  The tract was dilated and a 12-French multipurpose drain was reconstituted within the pleural space.  Additional bloody fluid was aspirated.  The catheter was sutured to the skin and attached to a Pleur Evac.  Findings:There is a pleural based air-fluid collection at the right lung base.  The catheter position was confirmed within the complex pleural collection.  Findings are compatible with history of empyema.  Complications: None  Impression:CT guided placement of a pigtail chest tube within the right chest empyema.   Original Report Authenticated By: Richarda Overlie, M.D.    Dg Chest [facility]  1 View  07/27/2012  *RADIOLOGY REPORT*  Clinical Data: Follow up edema, shortness of breath  PORTABLE CHEST - 1 VIEW  Comparison: CT guided abscess drainage 07/26/2012; prior chest x- ray 07/26/2012  Findings: Interval placement of a pigtail thoracostomy tube in the inferior right pleural space.  Slightly decreased right pleural effusion.  Persistent associated right lower lobe atelectasis. No  pneumothorax.  Stable left subclavian approach cardiac rhythm maintenance device leads projecting over the right atrium and right ventricle.  Unchanged cardiomegaly.  The remainder of the lungs are clear.  Dilated and fluid-filled thoracic esophagus.  IMPRESSION:  1.  Interval placement of a right-sided pigtail thoracostomy tube in the right basilar empyema with a slight decrease in the amount of pleural fluid. 2.  Stable right lower lobe atelectasis.   Original Report Authenticated By: Malachy Moan, M.D.    Dg Chest Port 1 View  07/26/2012  *RADIOLOGY REPORT*  Clinical Data: Follow-up empyema  PORTABLE CHEST - 1 VIEW  Comparison: 07/25/2012  Findings: Persistent right basilar opacity consistent with pneumonia, atelectasis or a combination.  The lungs are otherwise clear with no new lung opacities.  There is no pneumothorax.  The cardiac silhouette is normal in size.  No mediastinal or hilar mass or adenopathy.  Stable, well-positioned left anterior chest wall sequential pacemaker.  IMPRESSION: No change from the previous day's study.  Persistent right lung base consolidation/atelectasis.  No pneumothorax.   Original Report Authenticated By: Amie Portland, M.D.    US Thoracentesis Asp Pleural Space W/img Guide  07/25/2012  *RADIOLOGY REPORT*  Clinical Data:  Patient with prior history of right VATS secondary to loculated effusion/empyema. She presents today for diagnostic/therapeutic right thoracentesis.  ULTRASOUND GUIDED DIAGNOSTIC AND THERAPEUTIC RIGHT  THORACENTESIS  An ultrasound guided thoracentesis was thoroughly discussed with the patient and questions answered.  The benefits, risks, alternatives and complications were also discussed.  The patient understands and wishes to proceed with the procedure.  Written consent was obtained.  Ultrasound was performed to localize and mark an adequate pocket of fluid in the right chest.  The area was then prepped and draped in the normal sterile fashion.  1%  Lidocaine was used for local anesthesia.  Under ultrasound guidance a 19 gauge Yueh catheter was introduced.  Thoracentesis was performed.  The catheter was removed and a dressing applied.  Complications:  none  Findings: A total of approximately 65 cc's of thick,purulent reddish-brown fluid was removed. A fluid sample was sent for laboratory analysis. Secondary to the multi-loculated nature of the collection, only the above amount of fluid could be aspirated at this time.  IMPRESSION: Successful ultrasound guided diagnostic and therapeutic right thoracentesis yielding 65 cc's of pleural fluid.  Read by: Jeananne Rama, P.A.-C   Original Report Authenticated By: Tacey Ruiz, MD     Anti-infectives: Anti-infectives     Start     Dose/Rate Route Frequency Ordered Stop   07/26/12 0300   vancomycin (VANCOCIN) 500 mg in sodium chloride 0.9 % 100 mL IVPB        500 mg 100 mL/hr over 60 Minutes Intravenous Every 12 hours 07/25/12 1430     07/25/12 1500   levofloxacin (LEVAQUIN) IVPB 500 mg        500 mg 100 mL/hr over 60 Minutes Intravenous Every 24 hours 07/25/12 1356     07/25/12 1500  piperacillin-tazobactam (ZOSYN) IVPB 3.375 g       3.375 g 12.5 mL/hr over 240 Minutes Intravenous 3 times per  day 07/25/12 1430     07/25/12 1430   vancomycin (VANCOCIN) IVPB 1000 mg/200 mL premix        1,000 mg 200 mL/hr over 60 Minutes Intravenous  Once 07/25/12 1430 07/25/12 1757   07/25/12 1400   levofloxacin (LEVAQUIN) tablet 500 mg  Status:  Discontinued        500 mg Oral Daily 07/25/12 1355 07/25/12 1356          Assessment/Plan: s/p rt chest empyema drainage 1/15; monitor labs/films; check final pleural fluid cx's/cytology; other plans as per TCTS/primary.  LOS: 2 days    Aleera Gilcrease,D [facility] 07/27/2012

## 2012-07-27 NOTE — Evaluation (Signed)
Clinical/Bedside Swallow Evaluation Patient Details  Name: [name] MRN: [id number] Date of Birth: 1929/03/15  Today's Date: 07/27/2012 Time: 0865-7846 SLP Time Calculation (min): 17 min  Past Medical History:  Past Medical History  Diagnosis Date  . GERD (gastroesophageal reflux disease)   . Cataract   . Blood transfusion without reported diagnosis   . PNA (pneumonia)   . Empyema, right     VATS 06/23/2012 cultures negative to date CXR 07/19/12 persistent airfluid levels  . Polymyalgia rheumatica     on chronic Prednisone 5mg  daily  . Sleep apnea     CPAP at night  . Pacemaker     medtronic  . Neuromuscular disorder     polymyalgia rheumatica  . Arthritis    Past Surgical History:  Past Surgical History  Procedure Date  . Appendectomy   . Video bronchoscopy 06/23/2012    Procedure: VIDEO BRONCHOSCOPY;  Surgeon: Delight Ovens, MD;  Location: [facility] OR;  Service: Thoracic;  Laterality: N/A;  . Video assisted thoracoscopy (vats)/decortication 06/23/2012    Procedure: VIDEO ASSISTED THORACOSCOPY (VATS)/DECORTICATION;  Surgeon: Delight Ovens, MD;  Location: [facility] OR;  Service: Thoracic;  Laterality: Right;  . Esophagogastroduodenoscopy (egd) with esophageal dilation 06/27/2012    Procedure: ESOPHAGOGASTRODUODENOSCOPY (EGD) WITH ESOPHAGEAL DILATION;  Surgeon: Meryl Dare, MD,FACG;  Location: [facility] ENDOSCOPY;  Service: Endoscopy;  Laterality: N/A;  . Video assisted thoracoscopy (vats)/empyema     06/23/2012  . Eye surgery     bilateral cataract removal  . Joint replacement     right hip  . Esophagogastroduodenoscopy 07/24/2012    Procedure: ESOPHAGOGASTRODUODENOSCOPY (EGD);  Surgeon: Louis Meckel, MD;  Location: [facility];  Service: Endoscopy;  Laterality: N/A;  . Balloon dilation 07/24/2012    Procedure: BALLOON DILATION;  Surgeon: Louis Meckel, MD;  Location: [facility] ENDOSCOPY;  Service: Endoscopy;  Laterality: N/A;   HPI:  79 y/o F with PMH of polymyalgia  rheumatica on chronic steroids as well as pacemaker who was last seen in office 1/8 for follow up of  RLL PNA and empyema . [name] was diagnosed with aspiration pneumonia secondary to esophageal dysmotility on 06/19/2012 and underwent VATS for empyema on 06/23/2012.  Seen for follow up 1/8 and c/o dyspnea, cough.  Had been on Levaquin for symptoms.  CT eval 1/9 with concern for empyema with air fluid levels.  1/13 underwent EGD for esophageal dilation per Arlyce Dice.  1/14 had R thoracentesis with purulent fluid drained.  Admitted to [facility] on 1/14 for CVTS eval of R chest.  Chest tube placed 1/15.     Assessment / Plan / Recommendation Clinical Impression  Pt. demonstrated functional oropharyngeal swallow function.  She reports the globus sensation, po emesis has ceased since esophageal dilation 1/13.  No s/s aspiration observed.  Therapeutic intervention included discussion of reflux precations and strategies to decrease esophageal symptoms.  SLP will provide pt with written handout as well.  Pt. verbalized understanding.  No f/u indicated.     Aspiration Risk  Mild    Diet Recommendation Regular;Thin liquid   Liquid Administration via: Cup;No straw Medication Administration: Whole meds with liquid Supervision: Patient able to self feed Compensations: Slow rate;Small sips/bites;Follow solids with liquid Postural Changes and/or Swallow Maneuvers: Seated upright 90 degrees;Upright 30-60 min after meal (reflux precautions)    Other  Recommendations Oral Care Recommendations: Oral care BID   Follow Up Recommendations  None    Frequency and Duration  Swallow Study         Oral/Motor/Sensory Function Overall Oral Motor/Sensory Function: Appears within functional limits for tasks assessed   Ice Chips Ice chips: Not tested   Thin Liquid Thin Liquid: Within functional limits Presentation: Cup    Nectar Thick Nectar Thick Liquid: Not tested   Honey Thick Honey Thick Liquid: Not  tested   Puree Puree: Not tested   Solid     Solid: Within functional limits      [name] [employer] [phone]  07/27/2012

## 2012-07-28 ENCOUNTER — Inpatient Hospital Stay (HOSPITAL_COMMUNITY): Payer: Medicare Other

## 2012-07-28 ENCOUNTER — Encounter (HOSPITAL_COMMUNITY): Payer: Self-pay | Admitting: *Deleted

## 2012-07-28 DIAGNOSIS — J90 Pleural effusion, not elsewhere classified: Secondary | ICD-10-CM

## 2012-07-28 MED ORDER — LEVOFLOXACIN 500 MG PO TABS
500.0000 mg | ORAL_TABLET | ORAL | Status: DC
  Administered 2012-07-28 – 2012-07-30 (×3): 500 mg via ORAL
  Filled 2012-07-28 (×4): qty 1

## 2012-07-28 NOTE — Progress Notes (Signed)
Pt ambulated 200 ft independently, steady gait. Will continue to monitor.

## 2012-07-28 NOTE — Progress Notes (Addendum)
Subjective:  [name] is without complaints this morning.   Objective: Vital signs in last 24 hours: Temp:  [98.2 F (36.8 C)-98.8 F (37.1 C)] 98.2 F (36.8 C) (01/17 0448) Pulse Rate:  [86-92] 89  (01/17 1041) Cardiac Rhythm:  [-] Normal sinus rhythm;Heart block (01/17 0820) Resp:  [18-19] 19  (01/17 0448) BP: (117-134)/(65-75) 117/69 mmHg (01/17 1041) SpO2:  [94 %-97 %] 97 % (01/17 0448) Weight:  [149 lb 0.5 oz (67.6 kg)] 149 lb 0.5 oz (67.6 kg) (01/17 0448)  Intake/Output from previous day: 01/16 0701 - 01/17 0700 In: 1315 [P.O.:720; I.V.:295; IV Piggyback:300] Out: 1136 [Urine:1100; Chest Tube:36]  General appearance: alert, cooperative and no distress Heart: regular rate and rhythm Lungs: clear to auscultation bilaterally Wound: clean and dry  Lab Results:  [facility] 07/27/12 0535 07/26/12 0450  WBC 6.0 7.9  HGB 10.8* 11.2*  HCT 33.1* 34.1*  PLT 187 211   BMET:  Basename 07/27/12 0535 07/26/12 0450  NA 140 138  K 3.5 3.5  CL 101 99  CO2 32 31  GLUCOSE 107* 109*  BUN 11 10  CREATININE 0.65 0.53  CALCIUM 9.2 9.4    PT/INR:  Basename 07/25/12 1804  LABPROT 13.7  INR 1.06   ABG    Component Value Date/Time   PHART 7.486* 07/22/2012 1257   HCO3 34.5* 07/22/2012 1257   TCO2 36 07/22/2012 1257   O2SAT 98.0 07/22/2012 1257   CBG (last 3)  No results found for this basename: GLUCAP:3 in the last 72 hours  Assessment/Plan:  1. CV- NSR 2. Pulm- improved aeration, no pneumothorax- can d/c chest tube 3. GI- patient needs Barium swallow, possibly today so patient can be discharged over the weekend   LOS: 3 days    BARRETT, ERIN 07/28/2012   Plan on removal of ct no drainage Was to have swallow Monday will get while in hospital I have seen and examined [name] and agree with the above assessment  and plan.  Delight Ovens MD [employer] [phone] Office [phone] 07/28/2012 3:56 PM

## 2012-07-28 NOTE — Progress Notes (Signed)
R chest tube d/c at this time; pt tolerated well; occlusive dressing applied to site; will cont. To monitor.

## 2012-07-28 NOTE — Progress Notes (Signed)
Subjective: Rt chest tube drain placed 1/15 Empyema Feels better cxr improved  Objective: Vital signs in last 24 hours: Temp:  [98.2 F (36.8 C)-98.8 F (37.1 C)] 98.2 F (36.8 C) (01/17 0448) Pulse Rate:  [85-92] 87  (01/17 0448) Resp:  [18-19] 19  (01/17 0448) BP: (115-134)/(65-75) 134/68 mmHg (01/17 0448) SpO2:  [94 %-97 %] 97 % (01/17 0448) Weight:  [149 lb 0.5 oz (67.6 kg)] 149 lb 0.5 oz (67.6 kg) (01/17 0448) Last BM Date: 07/26/12  Intake/Output from previous day: 01/16 0701 - 01/17 0700 In: 1315 [P.O.:720; I.V.:295; IV Piggyback:300] Out: 1136 [Urine:1100; Chest Tube:36] Intake/Output this shift:    PE: afeb; vss Chest tube intact Site sl tender; clean and dry Output 35 cc 1/16 100 cc in pleurvac: bloody No air leak CXR improved Cx neg   Lab Results:   [facility] 07/27/12 0535 07/26/12 0450  WBC 6.0 7.9  HGB 10.8* 11.2*  HCT 33.1* 34.1*  PLT 187 211   BMET  Basename 07/27/12 0535 07/26/12 0450  NA 140 138  K 3.5 3.5  CL 101 99  CO2 32 31  GLUCOSE 107* 109*  BUN 11 10  CREATININE 0.65 0.53  CALCIUM 9.2 9.4   PT/INR  Basename 07/25/12 1804  LABPROT 13.7  INR 1.06   ABG No results found for this basename: PHART:2,PCO2:2,PO2:2,HCO3:2 in the last 72 hours  Studies/Results: Dg Chest 2 View  07/28/2012  *RADIOLOGY REPORT*  Clinical Data: Follow up pneumonia, empyema  CHEST - 2 VIEW  Comparison: Portable chest x-ray of 07/27/2012  Findings: Aeration of the lungs has improved.  There is persistent pleural and parenchymal opacity at the right lung base with chest tube remaining.   Also, there is an air-fluid level in the region of the middle mediastinum probably due to esophagogastrectomy. Mild cardiomegaly is stable and pacemaker remains.  IMPRESSION: Improved aeration with persistent pleural and parenchymal opacity at the right lung base with chest tube remaining.   Original Report Authenticated By: Dwyane Dee, M.D.    Ct Guided Abscess  Drain  07/26/2012  *RADIOLOGY REPORT*  Clinical history:79 year old with Oneill right chest empyema.  PROCEDURE(S): CT GUIDED PLACEMENT OF Oneill RIGHT CHEST TUBE.  Physician: Rachelle Hora. Henn, MD  Medications:Versed 2 mg, Fentanyl 100 mcg. Oneill radiology nurse monitored the patient for moderate sedation.  Moderate sedation time:35 minutes  Procedure:The procedure was explained to the patient.  The risks and benefits of the procedure were discussed and the patient's questions were addressed.  Informed consent was obtained from the patient.  The patient was placed supine on the CT scanner and the right side was slightly elevated.  Images through the lower chest were obtained.  The right mid axillary region was prepped and draped in Oneill sterile fashion.  The skin was anesthetized with lidocaine.  Oneill 5-French Yueh catheter was directed into the pleural space while aspirating.  Thick bloody fluid was aspirated.  Oneill stiff Amplatz wire was placed and position was confirmed with CT.  The tract was dilated and Oneill 12-French multipurpose drain was reconstituted within the pleural space.  Additional bloody fluid was aspirated.  The catheter was sutured to the skin and attached to Oneill Pleur Evac.  Findings:There is Oneill pleural based air-fluid collection at the right lung base.  The catheter position was confirmed within the complex pleural collection.  Findings are compatible with history of empyema.  Complications: None  Impression:CT guided placement of Oneill pigtail chest tube within the right chest empyema.  Original Report Authenticated By: Richarda Overlie, M.D.    Dg Chest Port 1 View  07/27/2012  *RADIOLOGY REPORT*  Clinical Data: Follow up edema, shortness of breath  PORTABLE CHEST - 1 VIEW  Comparison: CT guided abscess drainage 07/26/2012; prior chest x- ray 07/26/2012  Findings: Interval placement of Oneill pigtail thoracostomy tube in the inferior right pleural space.  Slightly decreased right pleural effusion.  Persistent associated right lower lobe  atelectasis. No pneumothorax.  Stable left subclavian approach cardiac rhythm maintenance device leads projecting over the right atrium and right ventricle.  Unchanged cardiomegaly.  The remainder of the lungs are clear.  Dilated and fluid-filled thoracic esophagus.  IMPRESSION:  1.  Interval placement of Oneill right-sided pigtail thoracostomy tube in the right basilar empyema with Oneill slight decrease in the amount of pleural fluid. 2.  Stable right lower lobe atelectasis.   Original Report Authenticated By: Malachy Moan, M.D.     Anti-infectives:   Assessment/Plan: s/p  Rt empyema chest tube drain placed 1/15 Doing well cxr improved Output  100 cc total Plan per Dr Lowella Fairy   LOS: 3 days    [name] 07/28/2012

## 2012-07-28 NOTE — Progress Notes (Signed)
PULMONARY  / CRITICAL CARE MEDICINE  Name: [name] MRN: [id number] DOB: [birth year]    LOS: 3   CHIEF COMPLAINT: Empyema   BRIEF PATIENT DESCRIPTION:  79 y/o F with PMH of polymyalgia rheumatica on chronic steroids as well as pacemaker who was last seen in office 1/8 for follow up of RLL PNA and empyema . [name] was diagnosed with aspiration pneumonia secondary to esophageal dysmotility on 06/19/2012 and underwent VATS for empyema on 06/23/2012. Seen for follow up 1/8 and c/o dyspnea, cough. Had been on Levaquin for symptoms. CT eval 1/9 with concern for empyema with air fluid levels. 1/13 underwent EGD for esophageal dilation per [name]. 1/14 had R thoracentesis with purulent fluid drained. Admitted to [facility] on 1/14 for CVTS eval of R chest.    LINES / TUBES: Rt chest tube (IR) 1/15 >>  CULTURES: Blood 1/14 >>  ANTIBIOTICS: Vancomycin 1/14 >> Zosyn 1/14 >>  Levaquin 1/14 >>  SIGNIFICANT EVENTS:  1/14 - Admit with reoccurance of R empyema 1/14 - Rt thoracentesis >> Protein 3.6, LDH > 10,000, Amylase < 3, Albumin 1.2, 83% neutrophils 1/15 - IR pleural fluid drain   INTERVAL HISTORY:  Breathing better.  VITAL SIGNS: Temp:  [98.2 F (36.8 C)-98.8 F (37.1 C)] 98.2 F (36.8 C) (01/17 0448) Pulse Rate:  [85-92] 89  (01/17 1041) Resp:  [18-19] 19  (01/17 0448) BP: (115-134)/(65-75) 117/69 mmHg (01/17 1041) SpO2:  [94 %-97 %] 97 % (01/17 0448) Weight:  [149 lb 0.5 oz (67.6 kg)] 149 lb 0.5 oz (67.6 kg) (01/17 0448)  PHYSICAL EXAMINATION: General:  No distress Neuro:  Normal strength HEENT:  No sinus tenderness Cardiovascular:  s1s2 regular, no murmur Lungs:  Decreased breath sounds, no wheeze, Rt chest tube in place Abdomen:  Soft, non tender Musculoskeletal:  No edema Skin:  No rashes   Lab 07/27/12 0535 07/26/12 0450 07/25/12 1804  NA 140 138 136  K 3.5 3.5 2.8*  CL 101 99 96  CO2 32 31 30  BUN 11 10 11   CREATININE 0.65 0.53 0.53  GLUCOSE 107* 109* 160*     Lab 07/27/12 0535 07/26/12 0450 07/25/12 1804  HGB 10.8* 11.2* 11.7*  HCT 33.1* 34.1* 35.4*  WBC 6.0 7.9 9.8  PLT 187 211 224   Dg Chest 2 View  07/28/2012  *RADIOLOGY REPORT*  Clinical Data: Follow up pneumonia, empyema  CHEST - 2 VIEW  Comparison: Portable chest x-ray of 07/27/2012  Findings: Aeration of the lungs has improved.  There is persistent pleural and parenchymal opacity at the right lung base with chest tube remaining.   Also, there is an air-fluid level in the region of the middle mediastinum probably due to esophagogastrectomy. Mild cardiomegaly is stable and pacemaker remains.  IMPRESSION: Improved aeration with persistent pleural and parenchymal opacity at the right lung base with chest tube remaining.   Original Report Authenticated By: Dwyane Dee, M.D.    Ct Guided Abscess Drain  07/26/2012  *RADIOLOGY REPORT*  Clinical history:79 year old with a right chest empyema.  PROCEDURE(S): CT GUIDED PLACEMENT OF A RIGHT CHEST TUBE.  Physician: Rachelle Hora. Henn, MD  Medications:Versed 2 mg, Fentanyl 100 mcg. A radiology nurse monitored the patient for moderate sedation.  Moderate sedation time:35 minutes  Procedure:The procedure was explained to the patient.  The risks and benefits of the procedure were discussed and the patient's questions were addressed.  Informed consent was obtained from the patient.  The patient was placed supine on the CT scanner  and the right side was slightly elevated.  Images through the lower chest were obtained.  The right mid axillary region was prepped and draped in a sterile fashion.  The skin was anesthetized with lidocaine.  A 5-French Yueh catheter was directed into the pleural space while aspirating.  Thick bloody fluid was aspirated.  A stiff Amplatz wire was placed and position was confirmed with CT.  The tract was dilated and a 12-French multipurpose drain was reconstituted within the pleural space.  Additional bloody fluid was aspirated.  The catheter was  sutured to the skin and attached to a Pleur Evac.  Findings:There is a pleural based air-fluid collection at the right lung base.  The catheter position was confirmed within the complex pleural collection.  Findings are compatible with history of empyema.  Complications: None  Impression:CT guided placement of a pigtail chest tube within the right chest empyema.   Original Report Authenticated By: Richarda Overlie, M.D.    Dg Chest Port 1 View  07/27/2012  *RADIOLOGY REPORT*  Clinical Data: Follow up edema, shortness of breath  PORTABLE CHEST - 1 VIEW  Comparison: CT guided abscess drainage 07/26/2012; prior chest x- ray 07/26/2012  Findings: Interval placement of a pigtail thoracostomy tube in the inferior right pleural space.  Slightly decreased right pleural effusion.  Persistent associated right lower lobe atelectasis. No pneumothorax.  Stable left subclavian approach cardiac rhythm maintenance device leads projecting over the right atrium and right ventricle.  Unchanged cardiomegaly.  The remainder of the lungs are clear.  Dilated and fluid-filled thoracic esophagus.  IMPRESSION:  1.  Interval placement of a right-sided pigtail thoracostomy tube in the right basilar empyema with a slight decrease in the amount of pleural fluid. 2.  Stable right lower lobe atelectasis.   Original Report Authenticated By: Malachy Moan, M.D.     ASSESSMENT / PLAN:  Aspiration pneumonia with RLL pneumonia and empyema. S/p Rt VATS decortication 06/23/12.  Appreciate assistance from Dr. Tyrone Sage.  Very little outpt from chest tube. Plan: F/u CXR D4/x vancomycin, zosyn, levaquin Continue chest tube to suction for now >> may be able to d/c soon; defer to TCTS PRN BD's  Hx of OSA. Plan: CPAP qhs  Mega-esophagus with stenosis of GE junction s/p dilation. Had EGD with [facility] 07/24/12.  Seen by speech 1/16. Plan: Regular diet, thin liquids Will need barium swallow before d/c home >> defer to TCTS Continue  protonix  Hx of HTN. Plan: Continue HCTZ, metoprolol  Hx of polymyalgia rheumatica Plan: Continue prednisone  Coralyn Helling, MD [facility] Pulmonary/Critical Care 07/28/2012, 11:13 AM Pager:  [phone] After 3pm call: [phone]

## 2012-07-28 NOTE — Progress Notes (Signed)
ANTIBIOTIC CONSULT NOTE - follow up Pharmacy Consult for Vanco/Zosyn and levaquin Indication: Empyema  Allergies  Allergen Reactions  . Actonel (Risedronate Sodium)     Joint aches; rechallenged --caused joint aches  . Ivp Dye (Iodinated Diagnostic Agents) Nausea And Vomiting    Patient Measurements: Height: 5\' 1"  (154.9 cm) Weight: 149 lb 0.5 oz (67.6 kg) IBW/kg (Calculated) : 47.8 IBW=47.8 kg, ABW 70.8 kg Adjusted Body Weight: 57 kg   Vital Signs: Temp: 98.2 F (36.8 C) (01/17 0448) Temp src: Oral (01/17 0448) BP: 117/69 mmHg (01/17 1041) Pulse Rate: 89  (01/17 1041) Intake/Output from previous day: 01/16 0701 - 01/17 0700 In: 1315 [P.O.:720; I.V.:295; IV Piggyback:300] Out: 1136 [Urine:1100; Chest Tube:36] Intake/Output from this shift:    Labs:  Basename 07/27/12 0535 07/26/12 0450 07/25/12 1804  WBC 6.0 7.9 9.8  HGB 10.8* 11.2* 11.7*  PLT 187 211 224  LABCREA -- -- --  CREATININE 0.65 0.53 0.53   Estimated Creatinine Clearance: 48.5 ml/min (by C-G formula based on Cr of 0.65). No results found for this basename: VANCOTROUGH:2,VANCOPEAK:2,VANCORANDOM:2,GENTTROUGH:2,GENTPEAK:2,GENTRANDOM:2,TOBRATROUGH:2,TOBRAPEAK:2,TOBRARND:2,AMIKACINPEAK:2,AMIKACINTROU:2,AMIKACIN:2, in the last 72 hours   Microbiology: Recent Results (from the past 720 hour(s))  BODY FLUID CULTURE     Status: Normal (Preliminary result)   Collection Time   07/25/12  2:39 PM      Component Value Range Status Comment   Specimen Description PLEURAL RIGHT   Final    Special Requests Normal   Final    Gram Stain     Final    Value: FEW WBC PRESENT, PREDOMINANTLY PMN     NO ORGANISMS SEEN   Culture NO GROWTH 2 DAYS   Final    Report Status PENDING   Incomplete   CULTURE, FUNGUS WITHOUT SMEAR     Status: Normal (Preliminary result)   Collection Time   07/25/12  2:39 PM      Component Value Range Status Comment   Specimen Description PLEURAL RIGHT   Final    Special Requests Normal   Final    Culture CULTURE IN PROGRESS FOR FOUR WEEKS   Final    Report Status PENDING   Incomplete   AFB CULTURE WITH SMEAR     Status: Normal (Preliminary result)   Collection Time   07/25/12  2:39 PM      Component Value Range Status Comment   Specimen Description PLEURAL RIGHT   Final    Special Requests Normal   Final    ACID FAST SMEAR NO ACID FAST BACILLI SEEN   Final    Culture     Final    Value: CULTURE WILL BE EXAMINED FOR 6 WEEKS BEFORE ISSUING A FINAL REPORT   Report Status PENDING   Incomplete   CULTURE, BLOOD (ROUTINE X 2)     Status: Normal (Preliminary result)   Collection Time   07/25/12  6:04 PM      Component Value Range Status Comment   Specimen Description BLOOD LEFT ARM   Final    Special Requests BOTTLES DRAWN AEROBIC AND ANAEROBIC 10CC   Final    Culture  Setup Time 07/26/2012 00:45   Final    Culture     Final    Value:        BLOOD CULTURE RECEIVED NO GROWTH TO DATE CULTURE WILL BE HELD FOR 5 DAYS BEFORE ISSUING A FINAL NEGATIVE REPORT   Report Status PENDING   Incomplete   CULTURE, BLOOD (ROUTINE X 2)     Status: Normal (  Preliminary result)   Collection Time   07/25/12  6:20 PM      Component Value Range Status Comment   Specimen Description BLOOD LEFT HAND   Final    Special Requests BOTTLES DRAWN AEROBIC ONLY 10CC   Final    Culture  Setup Time 07/26/2012 00:44   Final    Culture     Final    Value:        BLOOD CULTURE RECEIVED NO GROWTH TO DATE CULTURE WILL BE HELD FOR 5 DAYS BEFORE ISSUING A FINAL NEGATIVE REPORT   Report Status PENDING   Incomplete     Assessment: [name] is an 79 yo F s/p VATS for empyema 06/23/12 admitted 1/14 w/ recurrent empyema.  S/p IR drain placement 1/15.  CT out today.  Afeb. CXR improved. WBC 6. Creat cl ~ 48 ml/min.  No + culture data.   On Vanc/zosyn/levaquin since 07/25/12.   Goal of Therapy:  Vancomycin trough level 15-20 mcg/ml  Plan:  1. Change Levaquin to  500mg  PO q24h from IV 2. Continue Zosyn 3.375g IV q8hr. 3.  Continue vancomycin 100mg  IV q12hrs.  4. I am deferring vancomycin trough today 2nd to MD note that she may be discharge over the weekend. Herby Abraham, Pharm.D. [phone] 07/28/2012 1:31 PM

## 2012-07-29 ENCOUNTER — Inpatient Hospital Stay (HOSPITAL_COMMUNITY): Payer: Medicare Other

## 2012-07-29 DIAGNOSIS — K220 Achalasia of cardia: Secondary | ICD-10-CM

## 2012-07-29 LAB — CBC
Hemoglobin: 12.1 g/dL (ref 12.0–15.0)
RBC: 3.88 MIL/uL (ref 3.87–5.11)

## 2012-07-29 LAB — BASIC METABOLIC PANEL
Chloride: 96 mEq/L (ref 96–112)
GFR calc Af Amer: >90 mL/min (ref >90)
GFR calc non Af Amer: 83 mL/min — ABNORMAL LOW (ref >90)
Potassium: 3 mEq/L — ABNORMAL LOW (ref 3.5–5.1)
Sodium: 139 mEq/L (ref 135–145)

## 2012-07-29 LAB — BODY FLUID CULTURE
Culture: NO GROWTH
Special Requests: NORMAL

## 2012-07-29 MED ORDER — POTASSIUM CHLORIDE 20 MEQ/15ML (10%) PO LIQD
40.0000 meq | Freq: Two times a day (BID) | ORAL | Status: AC
  Administered 2012-07-29 (×2): 40 meq via ORAL
  Filled 2012-07-29 (×2): qty 30

## 2012-07-29 NOTE — Progress Notes (Signed)
PULMONARY  / CRITICAL CARE MEDICINE  Name: [name] MRN: [id number] DOB: 1928-09-24    LOS: 4   CHIEF COMPLAINT: Empyema   BRIEF PATIENT DESCRIPTION:  79 y/o F with PMH of polymyalgia rheumatica on chronic steroids as well as pacemaker who was last seen in office 1/8 for follow up of RLL PNA and empyema . [name] was diagnosed with aspiration pneumonia secondary to esophageal dysmotility on 06/19/2012 and underwent VATS for empyema on 06/23/2012. Seen for follow up 1/8 and c/o dyspnea, cough. Had been on Levaquin for symptoms. CT eval 1/9 with concern for empyema with air fluid levels. 1/13 underwent EGD for esophageal dilation per Arlyce Dice. 1/14 had R thoracentesis with purulent fluid drained. Admitted to [facility] on 1/14 for CVTS eval of R chest.    LINES / TUBES: Rt chest tube (IR) 1/15 >>  CULTURES: Blood 1/14 >>  ANTIBIOTICS: Vancomycin 1/14 >> Zosyn 1/14 >>  Levaquin 1/14 >>  SIGNIFICANT EVENTS:  1/14 - Admit with reoccurance of R empyema 1/14 - Rt thoracentesis >> Protein 3.6, LDH > 10,000, Amylase < 3, Albumin 1.2, 83% neutrophils 1/15 - IR pleural fluid drain   INTERVAL HISTORY:  Feels breathing is stable this am.  Just had barium swallow, with results pending.   VITAL SIGNS: Temp:  [97.5 F (36.4 C)-98 F (36.7 C)] 97.5 F (36.4 C) (01/18 0314) Pulse Rate:  [80-87] 80  (01/18 0314) Resp:  [18] 18  (01/18 0314) BP: (111-114)/(51-62) 114/51 mmHg (01/18 0314) SpO2:  [95 %-97 %] 97 % (01/18 0314)  PHYSICAL EXAMINATION: General:  Wd female in nad Neuro:  Alert, oriented, moves all 4.  HEENT:  Nose without purulence or d/c noted. Cardiovascular: rrr, no mRG Lungs: decreased bs, mild basilar crackles. Abdomen:  Soft, non tender Musculoskeletal:  No edema Skin:  No rashes   Lab 07/29/12 0740 07/27/12 0535 07/26/12 0450  NA 139 140 138  K 3.0* 3.5 3.5  CL 96 101 99  CO2 31 32 31  BUN 12 11 10   CREATININE 0.60 0.65 0.53  GLUCOSE 106* 107* 109*    Lab  07/29/12 0740 07/27/12 0535 07/26/12 0450  HGB 12.1 10.8* 11.2*  HCT 36.3 33.1* 34.1*  WBC 6.8 6.0 7.9  PLT 196 187 211   Dg Chest 2 View  07/28/2012  *RADIOLOGY REPORT*  Clinical Data: Follow up pneumonia, empyema  CHEST - 2 VIEW  Comparison: Portable chest x-ray of 07/27/2012  Findings: Aeration of the lungs has improved.  There is persistent pleural and parenchymal opacity at the right lung base with chest tube remaining.   Also, there is an air-fluid level in the region of the middle mediastinum probably due to esophagogastrectomy. Mild cardiomegaly is stable and pacemaker remains.  IMPRESSION: Improved aeration with persistent pleural and parenchymal opacity at the right lung base with chest tube remaining.   Original Report Authenticated By: Dwyane Dee, M.D.     ASSESSMENT / PLAN:  Aspiration pneumonia with RLL pneumonia and empyema. S/p Rt VATS decortication 06/23/12.  Appreciate assistance from Dr. Tyrone Sage.  Chest tube out Plan: Continue abx, followup cxr prn  Hx of OSA. Plan: CPAP qhs  Mega-esophagus with stenosis of GE junction s/p dilation. Had EGD with  GI 07/24/12.  Seen by speech 1/16. Plan: Regular diet, thin liquids F/u results of BS (pending) Continue protonix  Hx of HTN. Plan: Continue HCTZ, metoprolol  Hx of polymyalgia rheumatica Plan: Continue prednisone

## 2012-07-29 NOTE — Progress Notes (Addendum)
[address]            [name] [address]          [phone]          Subjective: Feels about the same with no new issues  Objective  Temp:  [97.5 F (36.4 C)-98 F (36.7 C)] 97.5 F (36.4 C) (01/18 0314) Pulse Rate:  [80-89] 80  (01/18 0314) Resp:  [18] 18  (01/18 0314) BP: (111-117)/(51-69) 114/51 mmHg (01/18 0314) SpO2:  [95 %-97 %] 97 % (01/18 0314)   Intake/Output Summary (Last 24 hours) at 07/29/12 0947 Last data filed at 07/29/12 0602  Gross per 24 hour  Intake    600 ml  Output   1000 ml  Net   -400 ml       General appearance: alert, cooperative and no distress Heart: regular rate and rhythm Lungs: dim in bases Abdomen: soft, mild dist, nontender  Lab Results:  Basename 07/29/12 0740 07/27/12 0535  NA 139 140  K 3.0* 3.5  CL 96 101  CO2 31 32  GLUCOSE 106* 107*  BUN 12 11  CREATININE 0.60 0.65  CALCIUM 9.8 9.2  MG -- --  PHOS -- --   No results found for this basename: AST:2,ALT:2,ALKPHOS:2,BILITOT:2,PROT:2,ALBUMIN:2 in the last 72 hours No results found for this basename: LIPASE:2,AMYLASE:2 in the last 72 hours  Basename 07/29/12 0740 07/27/12 0535  WBC 6.8 6.0  NEUTROABS -- --  HGB 12.1 10.8*  HCT 36.3 33.1*  MCV 93.6 93.8  PLT 196 187   No results found for this basename: CKTOTAL:4,CKMB:4,TROPONINI:4 in the last 72 hours No components found with this basename: POCBNP:3 No results found for this basename: DDIMER in the last 72 hours No results found for this basename: HGBA1C in the last 72 hours No results found for this basename: CHOL,HDL,LDLCALC,TRIG,CHOLHDL in the last 72 hours No results found for this basename: TSH,T4TOTAL,FREET3,T3FREE,THYROIDAB in the last 72 hours No results found for this basename: VITAMINB12,FOLATE,FERRITIN,TIBC,IRON,RETICCTPCT in the last 72 hours  Medications: Scheduled    . docusate sodium  100 mg Oral BID  . hydrochlorothiazide  25 mg Oral Daily  . levofloxacin   500 mg Oral Q24H  . metoprolol succinate  25 mg Oral Daily  . pantoprazole  40 mg Oral Q1200  . piperacillin-tazobactam (ZOSYN)  IV  3.375 g Intravenous Q8H  . predniSONE  4 mg Oral Daily  . sodium chloride  3 mL Intravenous Q12H  . vancomycin  500 mg Intravenous Q12H     Radiology/Studies:  Dg Chest 2 View  07/28/2012  *RADIOLOGY REPORT*  Clinical Data: Follow up pneumonia, empyema  CHEST - 2 VIEW  Comparison: Portable chest x-ray of 07/27/2012  Findings: Aeration of the lungs has improved.  There is persistent pleural and parenchymal opacity at the right lung base with chest tube remaining.   Also, there is an air-fluid level in the region of the middle mediastinum probably due to esophagogastrectomy. Mild cardiomegaly is stable and pacemaker remains.  IMPRESSION: Improved aeration with persistent pleural and parenchymal opacity at the right lung base with chest tube remaining.   Original Report Authenticated By: Dwyane Dee, M.D.     INR: Will add last result for INR, ABG once components are confirmed Will add last 4 CBG results once components are confirmed  Assessment/Plan:  Plan for barium swallow conts current management  LOS: 4 days    GOLD,WAYNE E 1/18/20149:47 AM   feels well, ct out, very little drainage. Barium swallow today, hope soon per pulmonary I have seen and examined Melissa Noon and agree with the above assessment  and plan.  Delight Ovens MD [facility] [phone] Office [phone] 07/29/2012 11:26 AM

## 2012-07-30 NOTE — Progress Notes (Addendum)
[address]            [place] [address]          [phone]          Subjective: Feels ok  Objective  Temp:  [97.2 F (36.2 C)-98.6 F (37 C)] 98.1 F (36.7 C) (01/19 0531) Pulse Rate:  [77-90] 77  (01/19 0531) Resp:  [16-18] 18  (01/19 0531) BP: (108-126)/(54-58) 108/54 mmHg (01/19 0531) SpO2:  [96 %-97 %] 96 % (01/19 0531) Weight:  [151 lb 3.2 oz (68.584 kg)] 151 lb 3.2 oz (68.584 kg) (01/19 0531)   Intake/Output Summary (Last 24 hours) at 07/30/12 0912 Last data filed at 07/30/12 0900  Gross per 24 hour  Intake    890 ml  Output   1700 ml  Net   -810 ml       General appearance: alert, cooperative and no distress Heart: regular rate and rhythm Lungs: clear to auscultation bilaterally Abdomen: soft, nontender  Lab Results:  Basename 07/29/12 0740  NA 139  K 3.0*  CL 96  CO2 31  GLUCOSE 106*  BUN 12  CREATININE 0.60  CALCIUM 9.8  MG --  PHOS --   No results found for this basename: AST:2,ALT:2,ALKPHOS:2,BILITOT:2,PROT:2,ALBUMIN:2 in the last 72 hours No results found for this basename: LIPASE:2,AMYLASE:2 in the last 72 hours  Basename 07/29/12 0740  WBC 6.8  NEUTROABS --  HGB 12.1  HCT 36.3  MCV 93.6  PLT 196   No results found for this basename: CKTOTAL:4,CKMB:4,TROPONINI:4 in the last 72 hours No components found with this basename: POCBNP:3 No results found for this basename: DDIMER in the last 72 hours No results found for this basename: HGBA1C in the last 72 hours No results found for this basename: CHOL,HDL,LDLCALC,TRIG,CHOLHDL in the last 72 hours No results found for this basename: TSH,T4TOTAL,FREET3,T3FREE,THYROIDAB in the last 72 hours No results found for this basename: VITAMINB12,FOLATE,FERRITIN,TIBC,IRON,RETICCTPCT in the last 72 hours  Medications: Scheduled    . docusate sodium  100 mg Oral BID  . hydrochlorothiazide  25 mg Oral Daily  . levofloxacin  500 mg Oral Q24H  . metoprolol  succinate  25 mg Oral Daily  . pantoprazole  40 mg Oral Q1200  . piperacillin-tazobactam (ZOSYN)  IV  3.375 g Intravenous Q8H  . predniSONE  4 mg Oral Daily  . sodium chloride  3 mL Intravenous Q12H  . vancomycin  500 mg Intravenous Q12H     Radiology/Studies:  Dg Chest 2 View  07/29/2012  *RADIOLOGY REPORT*  Clinical Data: Chest tube removal.  CHEST - 2 VIEW  Comparison: 1 day prior  Findings: Contrast remaining within a markedly dilated thoracic esophagus from esophagram earlier today.  Dual lead pacer at right atrium right ventricle.  Pleural parenchymal opacity in the inferior right hemithorax is not significantly changed.  Removal of right-sided chest tube, without evidence of apical pneumothorax. Suspicion of loculated small amount of inferior lateral right pleural air, only apparent on the lateral view.  IMPRESSION: Similar pleural parenchymal opacity at the right lung base, suspicious for infection.  Markedly dilated, contrast filled esophagus; secondary to esophagram performed earlier today.  Removal of right chest tube. No apical pneumothorax.  Suspicion of small inferolateral loculated right pneumothorax.   Original Report Authenticated By: Jeronimo Greaves, M.D.    Dg Esophagus  07/29/2012  *RADIOLOGY REPORT*  Clinical Data: The patient had  esophageal dilatation before 07/24/2012.  She reports no pain or difficulty with swallowing after the dilatation.  She has a known and dilated and tortuous esophagus, as demonstrated on recent CT chest examinations.  ESOPHOGRAM/BARIUM SWALLOW  Technique:  Single contrast and single contrast examination performed using thin barium  Fluoroscopy time:  3.9 minutes.  Comparison:  CT angio chest 07/20/2012  Findings:  The patient has a markedly tortuous esophagus.  The upper thoracic esophagus is dilated and has filling defects likely reflecting food particles, as is demonstrated on recent CT chest. Peristalsis is abnormal and there is some pooling/persistence of  contrast within the horizontally oriented aspects of the dilated tortuous esophagus. The mid thoracic esophagus demonstrates a more narrowed course, but a 13 mm barium tablet swallowed by the patient was able to traverse this region.  The distal esophagus is also dilated and tortuous. The 30 mm barium tablet persisted at the level of the distal esophagus, near the gastroesophageal junction for least 5 minutes and remained there at the end of the exam.  When the patient was placed prone on the table to obtain additional imaging of the esophagus she had some vomiting and coughing.  Due to this, the patient was moved back into the upright position. Therefore, the patient was not asked to drink barium with on the prone position.  No barium is seen beyond the confines of the esophagus to suggest a leak.  IMPRESSION:  1.  Markedly dilated and tortuous(especially in the proximal and distal aspects) esophagus.  There are filling defects within the dilated portion of the upper thoracic esophagus suggesting stasis of food particles. 2.  Abnormal esophageal motility, and stasis/pooling of barium within the more horizontally oriented portions of the tortuous esophagus. 3.  13 mm barium tablet persisted at the level of the distal esophagus near the GE junction for greater than 5 minutes.  This could be due to either a stricture or esophageal dysmotility. 4.  The patient did have some vomiting and coughing and she was placed in the prone position on the examining table.  Question if the patient could be prone to aspiration of the contents of the upper thoracic esophagus, where there is evidence of stasis, given the persistence of food particles in this region.   Original Report Authenticated By: Britta Mccreedy, M.D.     INR: Will add last result for INR, ABG once components are confirmed Will add last 4 CBG results once components are confirmed  Assessment/Plan: Stable, discussed some of the findings of swallow with  patient. May require tube feeding for safety from aspiration. Defer to GI/pulmonology.    LOS: 5 days    GOLD,WAYNE E 1/19/20149:12 AM  Home in am if no fever Has fu with gi and pulmonary I have seen and examined [name] and agree with the above assessment  and plan.  Delight Ovens MD [facility] [phone] Office [phone] 07/30/2012 11:56 AM

## 2012-07-30 NOTE — Progress Notes (Signed)
PULMONARY  / CRITICAL CARE MEDICINE  Name: [name] MRN: [id number] DOB: [birth year]    LOS: 5   CHIEF COMPLAINT: Empyema   BRIEF PATIENT DESCRIPTION:  79 y/o F with PMH of polymyalgia rheumatica on chronic steroids as well as pacemaker who was last seen in office 1/8 for follow up of RLL PNA and empyema . [name] was diagnosed with aspiration pneumonia secondary to esophageal dysmotility on 06/19/2012 and underwent VATS for empyema on 06/23/2012. Seen for follow up 1/8 and c/o dyspnea, cough. Had been on Levaquin for symptoms. CT eval 1/9 with concern for empyema with air fluid levels. 1/13 underwent EGD for esophageal dilation per Arlyce Dice. 1/14 had R thoracentesis with purulent fluid drained. Admitted to [facility] on 1/14 for CVTS eval of R chest.    LINES / TUBES: Rt chest tube (IR) 1/15 >>  CULTURES: Blood 1/14 >>  ANTIBIOTICS: Vancomycin 1/14 >> Zosyn 1/14 >>  Levaquin 1/14 >>  SIGNIFICANT EVENTS:  1/14 - Admit with reoccurance of R empyema 1/14 - Rt thoracentesis >> Protein 3.6, LDH > 10,000, Amylase < 3, Albumin 1.2, 83% neutrophils 1/15 - IR pleural fluid drain   INTERVAL HISTORY:  Feels breathing is stable this am.  BS shows significantly dilated esophagus, and had an episode of cough with aspiration while lying down.   VITAL SIGNS: Temp:  [97.2 F (36.2 C)-98.6 F (37 C)] 98.1 F (36.7 C) (01/19 0531) Pulse Rate:  [77-90] 77  (01/19 0531) Resp:  [16-18] 18  (01/19 0531) BP: (108-126)/(54-58) 108/54 mmHg (01/19 0531) SpO2:  [96 %-97 %] 96 % (01/19 0531) Weight:  [68.584 kg (151 lb 3.2 oz)] 68.584 kg (151 lb 3.2 oz) (01/19 0531)  PHYSICAL EXAMINATION: General:  Wd female in nad Neuro:  Alert, oriented, moves all 4.  HEENT:  Nose without purulence or d/c noted. Cardiovascular: rrr, no mRG Lungs: decreased bs, mild basilar crackles. Abdomen:  Soft, non tender Musculoskeletal:  No edema Skin:  No rashes   Lab 07/29/12 0740 07/27/12 0535 07/26/12 0450  NA  139 140 138  K 3.0* 3.5 3.5  CL 96 101 99  CO2 31 32 31  BUN 12 11 10   CREATININE 0.60 0.65 0.53  GLUCOSE 106* 107* 109*    Lab 07/29/12 0740 07/27/12 0535 07/26/12 0450  HGB 12.1 10.8* 11.2*  HCT 36.3 33.1* 34.1*  WBC 6.8 6.0 7.9  PLT 196 187 211   Dg Chest 2 View  07/29/2012  *RADIOLOGY REPORT*  Clinical Data: Chest tube removal.  CHEST - 2 VIEW  Comparison: 1 day prior  Findings: Contrast remaining within a markedly dilated thoracic esophagus from esophagram earlier today.  Dual lead pacer at right atrium right ventricle.  Pleural parenchymal opacity in the inferior right hemithorax is not significantly changed.  Removal of right-sided chest tube, without evidence of apical pneumothorax. Suspicion of loculated small amount of inferior lateral right pleural air, only apparent on the lateral view.  IMPRESSION: Similar pleural parenchymal opacity at the right lung base, suspicious for infection.  Markedly dilated, contrast filled esophagus; secondary to esophagram performed earlier today.  Removal of right chest tube. No apical pneumothorax.  Suspicion of small inferolateral loculated right pneumothorax.   Original Report Authenticated By: Jeronimo Greaves, M.D.    Dg Esophagus  07/29/2012  *RADIOLOGY REPORT*  Clinical Data: The patient had esophageal dilatation before 07/24/2012.  She reports no pain or difficulty with swallowing after the dilatation.  She has a known and dilated and tortuous esophagus,  as demonstrated on recent CT chest examinations.  ESOPHOGRAM/BARIUM SWALLOW  Technique:  Single contrast and single contrast examination performed using thin barium  Fluoroscopy time:  3.9 minutes.  Comparison:  CT angio chest 07/20/2012  Findings:  The patient has a markedly tortuous esophagus.  The upper thoracic esophagus is dilated and has filling defects likely reflecting food particles, as is demonstrated on recent CT chest. Peristalsis is abnormal and there is some pooling/persistence of  contrast within the horizontally oriented aspects of the dilated tortuous esophagus. The mid thoracic esophagus demonstrates a more narrowed course, but a 13 mm barium tablet swallowed by the patient was able to traverse this region.  The distal esophagus is also dilated and tortuous. The 30 mm barium tablet persisted at the level of the distal esophagus, near the gastroesophageal junction for least 5 minutes and remained there at the end of the exam.  When the patient was placed prone on the table to obtain additional imaging of the esophagus she had some vomiting and coughing.  Due to this, the patient was moved back into the upright position. Therefore, the patient was not asked to drink barium with on the prone position.  No barium is seen beyond the confines of the esophagus to suggest a leak.  IMPRESSION:  1.  Markedly dilated and tortuous(especially in the proximal and distal aspects) esophagus.  There are filling defects within the dilated portion of the upper thoracic esophagus suggesting stasis of food particles. 2.  Abnormal esophageal motility, and stasis/pooling of barium within the more horizontally oriented portions of the tortuous esophagus. 3.  13 mm barium tablet persisted at the level of the distal esophagus near the GE junction for greater than 5 minutes.  This could be due to either a stricture or esophageal dysmotility. 4.  The patient did have some vomiting and coughing and she was placed in the prone position on the examining table.  Question if the patient could be prone to aspiration of the contents of the upper thoracic esophagus, where there is evidence of stasis, given the persistence of food particles in this region.   Original Report Authenticated By: Britta Mccreedy, M.D.     ASSESSMENT / PLAN:  Aspiration pneumonia with RLL pneumonia and empyema. S/p Rt VATS decortication 06/23/12.  Appreciate assistance from Dr. Tyrone Sage.  Chest tube out Plan: Will d/c iv abx and leave on po  levaquin.  If no fever overnight, can probably go home from a pulmonary perspective.  Hx of OSA. Plan: CPAP qhs.  Will need to be cautious with this given her esophageal issues.  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 Had EGD with [facility] 07/24/12.  Seen by speech 1/16. Plan: Regular diet, thin liquids Await GI input regarding future planning Continue protonix  Hx of HTN. Plan: Continue HCTZ, metoprolol  Hx of polymyalgia rheumatica Plan: Continue prednisone

## 2012-07-31 ENCOUNTER — Encounter (HOSPITAL_COMMUNITY): Payer: Self-pay

## 2012-07-31 ENCOUNTER — Ambulatory Visit (HOSPITAL_COMMUNITY): Admit: 2012-07-31 | Payer: Self-pay | Admitting: Gastroenterology

## 2012-07-31 ENCOUNTER — Other Ambulatory Visit (HOSPITAL_COMMUNITY): Payer: Medicare Other

## 2012-07-31 LAB — BASIC METABOLIC PANEL
CO2: 32 mEq/L (ref 19–32)
Chloride: 97 mEq/L (ref 96–112)
Glucose, Bld: 123 mg/dL — ABNORMAL HIGH (ref 70–99)
Potassium: 4 mEq/L (ref 3.5–5.1)
Sodium: 138 mEq/L (ref 135–145)

## 2012-07-31 SURGERY — MANOMETRY, ESOPHAGUS

## 2012-07-31 MED ORDER — PREDNISONE 1 MG PO TABS: ORAL_TABLET | ORAL | Status: DC

## 2012-07-31 NOTE — Progress Notes (Signed)
Discharge instructions reviewed with and given to patient who verbalized understanding using teach back method.  Patient to discharge with son and husband to [facility] independent living.  Earney Mallet, RN

## 2012-07-31 NOTE — Discharge Summary (Signed)
Physician Discharge Summary  Patient ID: [name] MRN: [id number] DOB/AGE: 1928-11-30 79 y.o.  Admit date: 07/25/2012 Discharge date: 07/31/2012  Problem List Active Problems:  Empyema  HPI: 79 y/o F with PMH of polymyalgia rheumatica on chronic steroids as well as pacemaker who was last seen in office 1/8 for follow up of RLL PNA and empyema . [name] was diagnosed with aspiration pneumonia secondary to esophageal dysmotility on 06/19/2012 and underwent VATS for empyema on 06/23/2012. Seen for follow up 1/8 and c/o dyspnea, cough. Had been on Levaquin for symptoms. CT eval 1/9 with concern for empyema with air fluid levels. 1/13 underwent EGD for esophageal dilation per [name]. 1/14 had R thoracentesis with purulent fluid drained. Admitted to [facility] on 1/14 for CVTS eval of R chest.   Hospital Course:  LINES / TUBES:  Rt chest tube (IR) 1/15 >>1-17  CULTURES:  Blood 1/14 >>  ANTIBIOTICS:  Vancomycin 1/14 >>1-19  Zosyn 1/14 >> 1-19  Levaquin 1/14 >>to po 1-19>>  SIGNIFICANT EVENTS:  1/14 - Admit with reoccurance of R empyema  1/14 - Rt thoracentesis >> Protein 3.6, LDH > 10,000, Amylase < 3, Albumin 1.2, 83% neutrophils  1/15 - IR pleural fluid drain>>1-17    ASSESSMENT / PLAN:  Aspiration pneumonia with RLL pneumonia and empyema.  S/p Rt VATS decortication 06/23/12. Appreciate assistance from Dr. Tyrone Oneill. Chest tube out  Plan:  Will d/c iv abx and leave on po levaquin.  Go home today  Hx of OSA.  Plan:  CPAP qhs. Will need to be cautious with this given her esophageal issues. 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  Had EGD with [facility] 07/24/12. Seen by speech 1/16.  Plan:  Cleared for Regular diet, thin liquids  Swallowing eval has been reviewed by Dr [name], suspect she will require a repeat EGD with possible botox injection. We will arrange for outpt f/u with Dr [name].  Continue protonix  Hx of  HTN.  Plan:  Continue HCTZ, metoprolol  Hx of polymyalgia rheumatica  Plan:  Continue prednisone, per home med list .      Labs at discharge Lab Results  Component Value Date   CREATININE 0.54 07/31/2012   BUN 10 07/31/2012   NA 138 07/31/2012   K 4.0 07/31/2012   CL 97 07/31/2012   CO2 32 07/31/2012   Lab Results  Component Value Date   WBC 6.8 07/29/2012   HGB 12.1 07/29/2012   HCT 36.3 07/29/2012   MCV 93.6 07/29/2012   PLT 196 07/29/2012   Lab Results  Component Value Date   ALT 13 07/25/2012   AST 13 07/25/2012   ALKPHOS 118* 07/25/2012   BILITOT 0.3 07/25/2012   Lab Results  Component Value Date   INR 1.06 07/25/2012   INR 1.4* 07/19/2012   INR 1.20 06/23/2012    Current radiology studies Dg Chest 2 View  07/29/2012  *RADIOLOGY REPORT*  Clinical Data: Chest tube removal.  CHEST - 2 VIEW  Comparison: 1 day prior  Findings: Contrast remaining within a markedly dilated thoracic esophagus from esophagram earlier today.  Dual lead pacer at right atrium right ventricle.  Pleural parenchymal opacity in the inferior right hemithorax is not significantly changed.  Removal of right-sided chest tube, without evidence of apical pneumothorax. Suspicion of loculated small amount of inferior lateral right pleural air, only apparent on the lateral view.  IMPRESSION: Similar pleural parenchymal opacity at the right  lung base, suspicious for infection.  Markedly dilated, contrast filled esophagus; secondary to esophagram performed earlier today.  Removal of right chest tube. No apical pneumothorax.  Suspicion of small inferolateral loculated right pneumothorax.   Original Report Authenticated By: [name], M.D.     Disposition:  01-Home or Self Care  Discharge Orders    Future Appointments: Provider: Department: Dept Phone: Center:   08/02/2012 1:30 PM Jill Caddy, MD [facility] [phone] None   08/21/2012 10:30 AM Jill Meckel, MD [facility]  [phone] [facility]     Future Orders Please Complete By Expires   Discharge patient          Medication List     As of 07/31/2012  1:41 PM    STOP taking these medications         amoxicillin-clavulanate 875-125 MG per tablet   Commonly known as: AUGMENTIN      TAKE these medications         acetaminophen 325 MG tablet   Commonly known as: TYLENOL   Take 650 mg by mouth 2 (two) times daily as needed. For pain      albuterol 108 (90 BASE) MCG/ACT inhaler   Commonly known as: PROVENTIL HFA;VENTOLIN HFA   Inhale 2 puffs into the lungs 3 (three) times daily as needed. For shortness of breath or wheezing      b complex vitamins tablet   Take 1 tablet by mouth daily.      docusate sodium 100 MG capsule   Commonly known as: COLACE   Take 100 mg by mouth daily.      hydrochlorothiazide 25 MG tablet   Commonly known as: HYDRODIURIL   Take 25 mg by mouth daily.      ibuprofen 200 MG tablet   Commonly known as: ADVIL,MOTRIN   Take 400 mg by mouth 2 (two) times daily as needed.      levofloxacin 500 MG tablet   Commonly known as: LEVAQUIN   Take 500 mg by mouth every evening. Started 07/19/2012, 2 week course      metoprolol succinate 25 MG 24 hr tablet   Commonly known as: TOPROL-XL   Take 25 mg by mouth daily.      omeprazole 40 MG capsule   Commonly known as: PRILOSEC   Take 1 capsule (40 mg total) by mouth daily.      predniSONE 1 MG tablet   Commonly known as: DELTASONE   Take 1-4 mg by mouth daily. Tapering dose. Takes 4 mg daily x 1 more week (total of 30 days) then taper to 3mg  daily x 30 days then 2mg  daily x 30 days then 1mg  daily x 30 days then stop.      VITAMIN A & D PO   Take 1 capsule by mouth daily.           Follow-up Information    Follow up with [facility], [place]. On 08/02/2012. (Appt at 1:30.  Arrive at at 1:00 for Chest X-Ray.  [name] is downstairs in the [employer]. )    Contact information:   [address] [place]  [address] [phone]          Discharged Condition: {good Vital signs at Discharge. Temp:  [97.9 F (36.6 C)-98 F (36.7 C)] 97.9 F (36.6 C) (01/20 0435) Pulse Rate:  [83-99] 91  (01/20 1131) Resp:  [16-18] 18  (01/20 0435) BP: (110-135)/(57-78) 124/78 mmHg (01/20 1131) SpO2:  [  93 %-95 %] 94 % (01/20 0435) Weight:  [67.405 kg (148 lb 9.6 oz)] 67.405 kg (148 lb 9.6 oz) (01/20 0234) Office follow up Special Information or instructions. Follow up with Dr. Frederico Hamman. Signed: Brett Canales Minor ACNP [facility] Pager [phone] till 3 pm If no answer page [phone] 07/31/2012, 1:41 PM   Levy Pupa, MD [facility] 07/31/12

## 2012-07-31 NOTE — Progress Notes (Signed)
PULMONARY  / CRITICAL CARE MEDICINE  Name: [name] MRN: [id number] DOB: 1929/01/25    LOS: 6   CHIEF COMPLAINT: Empyema   BRIEF PATIENT DESCRIPTION:  79 y/o F with PMH of polymyalgia rheumatica on chronic steroids as well as pacemaker who was last seen in office 1/8 for follow up of RLL PNA and empyema . [name] was diagnosed with aspiration pneumonia secondary to esophageal dysmotility on 06/19/2012 and underwent VATS for empyema on 06/23/2012. Seen for follow up 1/8 and c/o dyspnea, cough. Had been on Levaquin for symptoms. CT eval 1/9 with concern for empyema with air fluid levels. 1/13 underwent EGD for esophageal dilation per Arlyce Dice. 1/14 had R thoracentesis with purulent fluid drained. [facility] on 1/14 for CVTS eval of R chest.    LINES / TUBES: Rt chest tube (IR) 1/15 >>1-17  CULTURES: Blood 1/14 >>  ANTIBIOTICS: Vancomycin 1/14 >>1-19 Zosyn 1/14 >> 1-19 Levaquin 1/14 >>to po 1-19>>  SIGNIFICANT EVENTS:  1/14 - Admit with reoccurance of R empyema 1/14 - Rt thoracentesis >> Protein 3.6, LDH > 10,000, Amylase < 3, Albumin 1.2, 83% neutrophils 1/15 - IR pleural fluid drain>>1-17   INTERVAL HISTORY:  Feels weak Await GI recs regarding her esophageal dysfxn  VITAL SIGNS: Temp:  [97.9 F (36.6 C)-98 F (36.7 C)] 97.9 F (36.6 C) (01/20 0435) Pulse Rate:  [83-99] 83  (01/20 0435) Resp:  [16-18] 18  (01/20 0435) BP: (110-135)/(57-74) 115/63 mmHg (01/20 0435) SpO2:  [93 %-95 %] 94 % (01/20 0435) Weight:  [67.405 kg (148 lb 9.6 oz)] 67.405 kg (148 lb 9.6 oz) (01/20 0234)  PHYSICAL EXAMINATION: General:  Wd female in nad, very weak and debilitated  Neuro:  Alert, oriented, moves all 4.  HEENT:  Nose without purulence or d/c noted. Cardiovascular: rrr, no mRG Lungs: decreased bs, mild basilar crackles. Abdomen:  Soft, non tender Musculoskeletal:  No edema Skin:  No rashes   Lab 07/29/12 0740 07/27/12 0535 07/26/12 0450  NA 139 140 138  K 3.0* 3.5  3.5  CL 96 101 99  CO2 31 32 31  BUN 12 11 10   CREATININE 0.60 0.65 0.53  GLUCOSE 106* 107* 109*    Lab 07/29/12 0740 07/27/12 0535 07/26/12 0450  HGB 12.1 10.8* 11.2*  HCT 36.3 33.1* 34.1*  WBC 6.8 6.0 7.9  PLT 196 187 211   Dg Chest 2 View  07/29/2012  *RADIOLOGY REPORT*  Clinical Data: Chest tube removal.  CHEST - 2 VIEW  Comparison: 1 day prior  Findings: Contrast remaining within a markedly dilated thoracic esophagus from esophagram earlier today.  Dual lead pacer at right atrium right ventricle.  Pleural parenchymal opacity in the inferior right hemithorax is not significantly changed.  Removal of right-sided chest tube, without evidence of apical pneumothorax. Suspicion of loculated small amount of inferior lateral right pleural air, only apparent on the lateral view.  IMPRESSION: Similar pleural parenchymal opacity at the right lung base, suspicious for infection.  Markedly dilated, contrast filled esophagus; secondary to esophagram performed earlier today.  Removal of right chest tube. No apical pneumothorax.  Suspicion of small inferolateral loculated right pneumothorax.   Original Report Authenticated By: Jeronimo Greaves, M.D.    Dg Esophagus  07/29/2012  *RADIOLOGY REPORT*  Clinical Data: The patient had esophageal dilatation before 07/24/2012.  She reports no pain or difficulty with swallowing after the dilatation.  She has a known and dilated and tortuous esophagus, as demonstrated on recent CT chest examinations.  ESOPHOGRAM/BARIUM SWALLOW  Technique:  Single contrast and single contrast examination performed using thin barium  Fluoroscopy time:  3.9 minutes.  Comparison:  CT angio chest 07/20/2012  Findings:  The patient has a markedly tortuous esophagus.  The upper thoracic esophagus is dilated and has filling defects likely reflecting food particles, as is demonstrated on recent CT chest. Peristalsis is abnormal and there is some pooling/persistence of contrast within the horizontally  oriented aspects of the dilated tortuous esophagus. The mid thoracic esophagus demonstrates a more narrowed course, but a 13 mm barium tablet swallowed by the patient was able to traverse this region.  The distal esophagus is also dilated and tortuous. The 30 mm barium tablet persisted at the level of the distal esophagus, near the gastroesophageal junction for least 5 minutes and remained there at the end of the exam.  When the patient was placed prone on the table to obtain additional imaging of the esophagus she had some vomiting and coughing.  Due to this, the patient was moved back into the upright position. Therefore, the patient was not asked to drink barium with on the prone position.  No barium is seen beyond the confines of the esophagus to suggest a leak.  IMPRESSION:  1.  Markedly dilated and tortuous(especially in the proximal and distal aspects) esophagus.  There are filling defects within the dilated portion of the upper thoracic esophagus suggesting stasis of food particles. 2.  Abnormal esophageal motility, and stasis/pooling of barium within the more horizontally oriented portions of the tortuous esophagus. 3.  13 mm barium tablet persisted at the level of the distal esophagus near the GE junction for greater than 5 minutes.  This could be due to either a stricture or esophageal dysmotility. 4.  The patient did have some vomiting and coughing and she was placed in the prone position on the examining table.  Question if the patient could be prone to aspiration of the contents of the upper thoracic esophagus, where there is evidence of stasis, given the persistence of food particles in this region.   Original Report Authenticated By: Britta Mccreedy, M.D.     ASSESSMENT / PLAN:  Aspiration pneumonia with RLL pneumonia and empyema. S/p Rt VATS decortication 06/23/12.  Appreciate assistance from Dr. Tyrone Sage.  Chest tube out Plan: Will d/c iv abx and leave on po levaquin.   Should be able to go  home today  Hx of OSA. Plan: CPAP qhs.  Will need to be cautious with this given her esophageal issues.  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 Had EGD with [facility] 07/24/12.  Seen by speech 1/16. Plan: Cleared for Regular diet, thin liquids Swallowing eval has been reviewed by Dr Arlyce Dice, suspect she will require a repeat EGD with possible botox injection. We will arrange for outpt f/u with Dr Arlyce Dice.  Continue protonix  Hx of HTN. Plan: Continue HCTZ, metoprolol  Hx of polymyalgia rheumatica Plan: Continue prednisone, per home med list was undergoing slow taper to zero >> would continue this plan  Brett Canales Minor ACNP [facility] P[facility] Pager [phone] till 3 pm If no answer page [phone] 07/31/2012, 10:44 AM  Levy Pupa, MD, PhD 07/31/2012, 12:18 PM  Pulmonary and Critical Care [phone] or if no answer [phone]

## 2012-07-31 NOTE — Progress Notes (Addendum)
                   [address]            [name] [address]          [phone]         Subjective:   Objective: Vital signs in last 24 hours: Temp:  [97.9 F (36.6 C)-98 F (36.7 C)] 97.9 F (36.6 C) (01/20 0435) Pulse Rate:  [83-99] 83  (01/20 0435) Cardiac Rhythm:  [-] Heart block (01/19 1945) Resp:  [16-18] 18  (01/20 0435) BP: (110-135)/(57-74) 115/63 mmHg (01/20 0435) SpO2:  [93 %-95 %] 94 % (01/20 0435) Weight:  [67.405 kg (148 lb 9.6 oz)] 67.405 kg (148 lb 9.6 oz) (01/20 0234)     Intake/Output from previous day: 01/19 0701 - 01/20 0700 In: 745 [P.O.:720; IV Piggyback:25] Out: 700 [Urine:700]   Physical Exam:  Cardiovascular: RRR Pulmonary: Diminished at right base; no rales, wheezes, or rhonchi. Abdomen: Soft, non tender, bowel sounds present. Extremities: No lower extremity edema. Wounds: Clean and dry.  No erythema or signs of infection.   Lab Results: CBC:  Basename 07/29/12 0740  WBC 6.8  HGB 12.1  HCT 36.3  PLT 196   BMET:   Basename 07/29/12 0740  NA 139  K 3.0*  CL 96  CO2 31  GLUCOSE 106*  BUN 12  CREATININE 0.60  CALCIUM 9.8    PT/INR: No results found for this basename: LABPROT,INR in the last 72 hours ABG:  INR: Will add last result for INR, ABG once components are confirmed Will add last 4 CBG results once components are confirmed  Assessment/Plan:  1. CV - SR 2.Pulmonary-Pig tail tube removed 1/17.Remains afebrile.Continue oral Levaquin 3.GI-on soft diet. Results noted from swallow study 1/14.May require some tube feeding to help prevent aspiration. Per Dr. Viviana Simpler al 4.From surgical point of view, may be discharged with follow up pulmonary and gastroenterology  [name],[name] 07/31/2012,7:49 AM  I have seen and examined [name] and agree with the above assessment  and plan.  Delight Ovens MD [facility] [phone] Office [phone] 07/31/2012 12:22 PM

## 2012-08-01 ENCOUNTER — Other Ambulatory Visit: Payer: Self-pay | Admitting: Pulmonary Disease

## 2012-08-01 DIAGNOSIS — J189 Pneumonia, unspecified organism: Secondary | ICD-10-CM

## 2012-08-01 LAB — CULTURE, BLOOD (ROUTINE X 2): Culture: NO GROWTH

## 2012-08-02 ENCOUNTER — Ambulatory Visit (INDEPENDENT_AMBULATORY_CARE_PROVIDER_SITE_OTHER): Payer: Medicare Other | Admitting: Gastroenterology

## 2012-08-02 ENCOUNTER — Ambulatory Visit (INDEPENDENT_AMBULATORY_CARE_PROVIDER_SITE_OTHER): Payer: Medicare Other | Admitting: Pulmonary Disease

## 2012-08-02 ENCOUNTER — Encounter: Payer: Self-pay | Admitting: Pulmonary Disease

## 2012-08-02 ENCOUNTER — Encounter: Payer: Self-pay | Admitting: Gastroenterology

## 2012-08-02 ENCOUNTER — Ambulatory Visit (INDEPENDENT_AMBULATORY_CARE_PROVIDER_SITE_OTHER)
Admission: RE | Admit: 2012-08-02 | Discharge: 2012-08-02 | Disposition: A | Discharge status: Home / Self Care | Payer: Medicare Other | Source: Ambulatory Visit | Attending: Pulmonary Disease | Admitting: Pulmonary Disease

## 2012-08-02 VITALS — BP 124/68 | HR 116 | Ht 61.0 in | Wt 148.0 lb

## 2012-08-02 VITALS — BP 108/56 | HR 122 | Temp 98.0°F | Ht 62.0 in | Wt 149.6 lb

## 2012-08-02 DIAGNOSIS — J189 Pneumonia, unspecified organism: Secondary | ICD-10-CM

## 2012-08-02 DIAGNOSIS — R1310 Dysphagia, unspecified: Secondary | ICD-10-CM | POA: Diagnosis not present

## 2012-08-02 DIAGNOSIS — J869 Pyothorax without fistula: Secondary | ICD-10-CM | POA: Diagnosis not present

## 2012-08-02 DIAGNOSIS — R1319 Other dysphagia: Secondary | ICD-10-CM

## 2012-08-02 MED ORDER — LEVOFLOXACIN 500 MG PO TABS: 500.0000 mg | ORAL_TABLET | Freq: Every evening | ORAL | Status: DC

## 2012-08-02 MED ORDER — AMOXICILLIN-POT CLAVULANATE 875-125 MG PO TABS: 1.0000 | ORAL_TABLET | Freq: Two times a day (BID) | ORAL | Status: DC

## 2012-08-02 MED ORDER — METOCLOPRAMIDE HCL 5 MG PO TABS: 5.0000 mg | ORAL_TABLET | Freq: Three times a day (TID) | ORAL | Status: DC

## 2012-08-02 NOTE — Assessment & Plan Note (Addendum)
Patient has had no clinical response to dilatation of the distal esophagus. The endoscopic findings and radiographic findings are very suggestive of achalasia but this was not confirmed by esophageal manometry done at [facility]. I had a detailed discussion with the patient and her family regarding the findings and suggested trying empiric injection of Botox at the LES on the possibility that she has, in fact, achalasia despite the manometry findings. Alternatives including PEG placement or repeating manometry were discussed. The patient has decided to proceed with this procedure. Should this not be effective then I think she'll require  PEG placement and I will refer her to a motility expert for second opinion. Case was discussed with Dr. Russella Dar who agrees with assessment and plan.

## 2012-08-02 NOTE — Progress Notes (Signed)
Reason for visit: hospital follow up on RLL PNA and empyema    79 y.o. female with past medical history polymyalgia rheumatica on chronic steroids as well as pacemaker who presents to clinic for hospital follow up on RLL PNA and empyema .  [name] was diagnosed with aspiration pneumonia secondary to esophageal dysmotility on 06/19/2012 and underwent VATS for empyema on 06/23/2012. Thoracentesis on 12/12 demonstrated an exudutive effusion/empyema and she underwent intubation and VATS procedure on 12/13. She required intubation and mechanical ventilation for 1 day and was extubated on 12/14. She had a chest tube in place from 12/13 to 12/16 which was removed without complication.   She received antibiotics for 10 days, 7 days IV while hospitalized and 3 days oral. She tolerated the antibiotics well with no side effects.   At discharge she required oxygen via nasal canula, 3L with rest and exertion due to respiratory insufficiency. During exertion, she desaturates to high 70s via pulse oximetry.  Cultures from her thoracentesis and her VATs remain negative to date for bacteria, AFB, fungi and yeast.   Today she comes in complaining of generalized weakness, dyspnea at rest but predominantly with exertion, cough productive of green sputum and hemoptysis (streaks associated with forceful cough). She denies fever, admits persistent RLE edema. She was seen in Dr. Dennie Maizes office 07/16/2012 and due to the above symptoms and fever, Levaquin was initiated. CXR performed at that time showed persistent pleural effusion with air fluid levels and RLL PNA. States that at home PT told her she would no longer require oxygen supplementation.   Manometry 12/13 showed no achalasia but esophageal stricture     Interim history Thoracentesis 07/25/12 with 65 cc puss; LDH>10000; no organisms seen;admitted to the hospital 07/25/12 for large bore empyema chest tube placement; 07/26/12 small chest tube placed by IR; no drainage  noticed; brode spectrum antibiotics IV started. Chest tube removed.   Today Continues to have dyspnea on exertion and coughing small amounts of white cheesy sputum; no fever no chills. Was discharged home on Levaquin.        Allergies  Allergen Reactions  . Actonel (Risedronate Sodium)     Joint aches; rechallenged --caused joint aches  . Ivp Dye (Iodinated Diagnostic Agents) Nausea And Vomiting   Past Medical History  Diagnosis Date  . GERD (gastroesophageal reflux disease)   . Cataract   . Blood transfusion without reported diagnosis   . PNA (pneumonia)   . Empyema, right     VATS 06/23/2012 cultures negative to date CXR 07/19/12 persistent airfluid levels  . Polymyalgia rheumatica     on chronic Prednisone 5mg  daily  . Sleep apnea     CPAP at night  . Pacemaker     medtronic  . Neuromuscular disorder     polymyalgia rheumatica  . Arthritis    Past Surgical History  Procedure Date  . Appendectomy   . Video bronchoscopy 06/23/2012    Procedure: VIDEO BRONCHOSCOPY;  Surgeon: Delight Ovens, MD;  Location: [facility] OR;  Service: Thoracic;  Laterality: N/A;  . Video assisted thoracoscopy (vats)/decortication 06/23/2012    Procedure: VIDEO ASSISTED THORACOSCOPY (VATS)/DECORTICATION;  Surgeon: Delight Ovens, MD;  Location: [facility] OR;  Service: Thoracic;  Laterality: Right;  . Esophagogastroduodenoscopy (egd) with esophageal dilation 06/27/2012    Procedure: ESOPHAGOGASTRODUODENOSCOPY (EGD) WITH ESOPHAGEAL DILATION;  Surgeon: Meryl Dare, MD,FACG;  Location: [facility] ENDOSCOPY;  Service: Endoscopy;  Laterality: N/A;  . Video assisted thoracoscopy (vats)/empyema     06/23/2012  .  Eye surgery     bilateral cataract removal  . Joint replacement     right hip  . Esophagogastroduodenoscopy 07/24/2012    Procedure: ESOPHAGOGASTRODUODENOSCOPY (EGD);  Surgeon: Louis Meckel, MD;  Location: [facility];  Service: Endoscopy;  Laterality: N/A;  . Balloon dilation 07/24/2012    Procedure:  BALLOON DILATION;  Surgeon: Louis Meckel, MD;  Location: [facility] ENDOSCOPY;  Service: Endoscopy;  Laterality: N/A;   Family History  Problem Relation Age of Onset  . Arthritis Father   . Heart disease Father   . Cancer Brother   . Heart disease Brother   . Diabetes Son    Current Outpatient Prescriptions  Medication Sig Dispense Refill  . acetaminophen (TYLENOL) 325 MG tablet Take 650 mg by mouth 2 (two) times daily as needed. For pain      . albuterol (PROVENTIL HFA;VENTOLIN HFA) 108 (90 BASE) MCG/ACT inhaler Inhale 2 puffs into the lungs 3 (three) times daily as needed. For shortness of breath or wheezing      . b complex vitamins tablet Take 1 tablet by mouth daily.      Marland Kitchen docusate sodium (COLACE) 100 MG capsule Take 100 mg by mouth daily as needed.       . hydrochlorothiazide (HYDRODIURIL) 25 MG tablet Take 25 mg by mouth daily.      Marland Kitchen ibuprofen (ADVIL,MOTRIN) 200 MG tablet Take 400 mg by mouth 2 (two) times daily as needed.      Marland Kitchen levofloxacin (LEVAQUIN) 500 MG tablet Take 1 tablet (500 mg total) by mouth every evening. Started 07/19/2012, 2 week course  30 tablet  3  . metoprolol succinate (TOPROL-XL) 25 MG 24 hr tablet Take 25 mg by mouth daily.      Marland Kitchen omeprazole (PRILOSEC) 40 MG capsule Take 1 capsule (40 mg total) by mouth daily.  30 capsule  0  . predniSONE (DELTASONE) 1 MG tablet 4 tabs x 7 days, then 3 tabs x 7 days, then 2 tabs x 7 days , then 1 tab daily and stay at that dose.  100 tablet  0  . Vitamins A & D (VITAMIN A & D PO) Take 1 capsule by mouth daily.      Marland Kitchen amoxicillin-clavulanate (AUGMENTIN) 875-125 MG per tablet Take 1 tablet by mouth 2 (two) times daily.  60 tablet  3  . metoCLOPramide (REGLAN) 5 MG tablet Take 1 tablet (5 mg total) by mouth 3 (three) times daily.  90 tablet  11   Review of Systems  Constitutional: Negativee for appetite change and unexpected weight change. Negative for fever.  HENT: Negative for ear pain, congestion, sore throat, sneezing and trouble  swallowing. Negative for dental problem.  Positive for swallow difficulties.  Respiratory:Per HPI  Cardiovascular: Per HPI. Negative for palpitations.  Gastrointestinal: Negative for abdominal pain.  Musculoskeletal: Negative for joint swelling.  Skin: Negative for rash.  Neurological: Negative for numbness.  Psychiatric/Behavioral: Positive for insomnia   BP 108/56  Pulse 122  Temp 98 F (36.7 C) (Oral)  Ht 5\' 2"  (1.575 m)  Wt 149 lb 9.6 oz (67.858 kg)  BMI 27.36 kg/m2  SpO2 93% General: Comfortable. Obese HEENT ; pupils round and reactive to light; mild nasal mucosa edema and erythema; retropharyngeal crowding with no erythema, edema or exudate; no cervical LAD  Cardiovascular: s1s2, no murmurs. Regular rate and rhythm.  Respiratory: Decreased breath sounds bilaterally, R>L, R crackles No increased work of breathing.  Abdomen: soft NT ND BS+.   Extremities:  RLE calf diameter>L. Homans negative;  Central nervous system: Alert and oriented No focal neurological deficits.     Assessment and plan:  1. RLL PNA and R empyema most likely with recurrent aspiration due to intra esophageal reflux in context of LES stenosis and esophageal dilation.   Continue Levaquin,  add Augmentin for better anaerobic coverage. To continue antibiotics for several weeks/until CXR changes resolve. I expect she will always have a component of trapped lung on the R.   2. LES stenosis; to follow up with Dr. Arlyce Dice for botox injection. She might benefit from reglan for esophageal motility; started today.     3. Follow up in clinic in 4 weeks; At that time we will repeat CXR.  Vanetta Mulders, MD  [facility]  [place], [place]

## 2012-08-02 NOTE — Patient Instructions (Addendum)
Please continue antibiotics; take Augmentin in addition to Levaquin.  Initiate Reglan 5 mg daily; please stop the reglan and call the office if excessive drowsiness or tremors.    We will see you back in one month with a repeat CXR.

## 2012-08-02 NOTE — Patient Instructions (Addendum)
You have been scheduled for an endoscopy at [facility] on 08/07/2012 Monday at 12:30pm

## 2012-08-02 NOTE — Progress Notes (Signed)
History of Present Illness:  The patient returns following balloon dilatation of the distal esophagus. She was dilated to 15 mm. Subjectively she has had no change in her symptoms. She vomited up eggs from prior ingestion  24 hours previously. A followup barium esophagram demonstrated esophageal dysmotility, pooling of barium and lodging of a 13 mm barium tablet for greater than 5 minutes at the GE junction. She continues to lose weight because of fear of eating.      Review of Systems: Pertinent positive and negative review of systems were noted in the above HPI section. All other review of systems were otherwise negative.    Current Medications, Allergies, Past Medical History, Past Surgical History, Family History and Social History were reviewed in [facility] electronic medical record  Vital signs were reviewed in today's medical record. Physical Exam: General: Well developed , well nourished, no acute distress     

## 2012-08-03 ENCOUNTER — Telehealth: Payer: Self-pay | Admitting: Pulmonary Disease

## 2012-08-03 DIAGNOSIS — M25569 Pain in unspecified knee: Secondary | ICD-10-CM | POA: Diagnosis not present

## 2012-08-03 DIAGNOSIS — K220 Achalasia of cardia: Secondary | ICD-10-CM

## 2012-08-03 DIAGNOSIS — J869 Pyothorax without fistula: Secondary | ICD-10-CM

## 2012-08-03 NOTE — Telephone Encounter (Signed)
I talked to the patient's daughter, [name] and answered all her questions to the best of my abilities. She asked about a second opinion and we will refer the patient to [facility] CT surgery;   Mindy,   Could you please schedule an appointment for [name] with Dr. Alphonzo Dublin at [facility]; reason R pleural space empyema and megaesophagus.

## 2012-08-03 NOTE — Addendum Note (Signed)
Addended by: Marlowe Kays on: 08/03/2012 10:37 AM   Modules accepted: Orders

## 2012-08-03 NOTE — Telephone Encounter (Signed)
Called spoke with pt's daughter Nicole Cella who has some questions/concerns regarding ov w/ DA yesterday.  Daughter sounded upset on the phone.  Dr Frederico Hamman to call Nicole Cella personally.  Will sign off per Dr Liliane Channel recs.

## 2012-08-07 ENCOUNTER — Encounter (HOSPITAL_COMMUNITY): Payer: Self-pay | Admitting: *Deleted

## 2012-08-07 ENCOUNTER — Ambulatory Visit (HOSPITAL_COMMUNITY)
Admission: RE | Admit: 2012-08-07 | Discharge: 2012-08-07 | Disposition: A | Discharge status: Home / Self Care | Payer: Medicare Other | Source: Ambulatory Visit | Attending: Gastroenterology | Admitting: Gastroenterology

## 2012-08-07 ENCOUNTER — Encounter (HOSPITAL_COMMUNITY)
Admission: RE | Disposition: A | Discharge status: Home / Self Care | Payer: Self-pay | Source: Ambulatory Visit | Attending: Gastroenterology

## 2012-08-07 DIAGNOSIS — K31819 Angiodysplasia of stomach and duodenum without bleeding: Secondary | ICD-10-CM

## 2012-08-07 DIAGNOSIS — K2289 Other specified disease of esophagus: Secondary | ICD-10-CM | POA: Diagnosis not present

## 2012-08-07 DIAGNOSIS — K228 Other specified diseases of esophagus: Secondary | ICD-10-CM | POA: Insufficient documentation

## 2012-08-07 DIAGNOSIS — R1310 Dysphagia, unspecified: Secondary | ICD-10-CM

## 2012-08-07 DIAGNOSIS — R1319 Other dysphagia: Secondary | ICD-10-CM

## 2012-08-07 DIAGNOSIS — K224 Dyskinesia of esophagus: Secondary | ICD-10-CM | POA: Diagnosis not present

## 2012-08-07 HISTORY — PX: ESOPHAGOGASTRODUODENOSCOPY: SHX5428

## 2012-08-07 HISTORY — PX: BOTOX INJECTION: SHX5754

## 2012-08-07 LAB — RHEUMATOID FACTORS, FLUID

## 2012-08-07 SURGERY — EGD (ESOPHAGOGASTRODUODENOSCOPY)
Anesthesia: Moderate Sedation

## 2012-08-07 MED ORDER — ONABOTULINUMTOXINA 100 UNITS IJ SOLR
100.0000 [IU] | Freq: Once | INTRAMUSCULAR | Status: AC
  Administered 2012-08-07: 100 [IU] via INTRAMUSCULAR
  Filled 2012-08-07: qty 100

## 2012-08-07 MED ORDER — MIDAZOLAM HCL 10 MG/2ML IJ SOLN
INTRAMUSCULAR | Status: DC | PRN
  Administered 2012-08-07 (×3): 2 mg via INTRAVENOUS

## 2012-08-07 MED ORDER — FENTANYL CITRATE 0.05 MG/ML IJ SOLN
INTRAMUSCULAR | Status: DC | PRN
  Administered 2012-08-07 (×3): 25 ug via INTRAVENOUS

## 2012-08-07 MED ORDER — FENTANYL CITRATE 0.05 MG/ML IJ SOLN
INTRAMUSCULAR | Status: AC
  Filled 2012-08-07: qty 2

## 2012-08-07 MED ORDER — BUTAMBEN-TETRACAINE-BENZOCAINE 2-2-14 % EX AERO
INHALATION_SPRAY | CUTANEOUS | Status: DC | PRN
  Administered 2012-08-07: 2 via TOPICAL

## 2012-08-07 MED ORDER — SODIUM CHLORIDE 0.9 % IV SOLN
INTRAVENOUS | Status: DC
  Administered 2012-08-07: 500 mL via INTRAVENOUS

## 2012-08-07 MED ORDER — MIDAZOLAM HCL 10 MG/2ML IJ SOLN
INTRAMUSCULAR | Status: AC
  Filled 2012-08-07: qty 2

## 2012-08-07 MED ORDER — DIPHENHYDRAMINE HCL 50 MG/ML IJ SOLN
INTRAMUSCULAR | Status: AC
  Filled 2012-08-07: qty 1

## 2012-08-07 MED ORDER — GLYCOPYRROLATE 0.2 MG/ML IJ SOLN
INTRAMUSCULAR | Status: AC
  Filled 2012-08-07: qty 1

## 2012-08-07 NOTE — Op Note (Signed)
[facility] [address] [place], [address]   ENDOSCOPY PROCEDURE REPORT  PATIENT: [name], [name]  MR#: [id number] BIRTHDATE: January 28, 1929 , 83  yrs. old GENDER: Female ENDOSCOPIST: Louis Meckel, MD REFERRED BY:  Marcelyn Bruins, M.D.  Sheliah Plane, M.D. PROCEDURE DATE:  08/07/2012 PROCEDURE:  EGD w/ directed submucosal injection(s), any substance ASA CLASS:     Class III INDICATIONS:  Dysphagia. MEDICATIONS: These medications were titrated to patient response per physician's verbal order, Fentanyl 75 mcg IV, and Versed 6 mg IV TOPICAL ANESTHETIC: Cetacaine Spray  DESCRIPTION OF PROCEDURE: After the risks benefits and alternatives of the procedure were thoroughly explained, informed consent was obtained.  The Pentax Gastroscope [id number] endoscope was introduced through the mouth and advanced to the third portion of the duodenum. Without limitations.  The instrument was slowly withdrawn as the mucosa was fully examined.      Again noted was a matted esophagus.  There was a large amount of pooled solid and liquid.  The 9.8 mm gastroscope passed through the GE junction with minor resistance.  In the stomach there was a 2 mm AVM in the gastric fundus.   Again noted was a matted esophagus. There was a large amount of pooled solid and liquid.  The 9.8 mm gastroscope passed through the GE junction with minor resistance. In the stomach there was a 2 mm AVM in the gastric fundus.   The remainder of the upper endoscopy exam was otherwise normal.   After withdrawing the scope to the GE junction 25 units of Botox (1 cc) was injected submucosally into each quadrant at the GE junction. Retroflexed views revealed no abnormalities.     The scope was then withdrawn from the patient and the procedure completed.  COMPLICATIONS: There were no complications. ENDOSCOPIC IMPRESSION: 1.     many esophagus-status post Botox injection 2.     gastric  AVM  RECOMMENDATIONS: office visit 3-4 weeks3-4 weeks REPEAT EXAM:  eSigned:  Louis Meckel, MD 08/07/2012 1:02 PM   CC:

## 2012-08-07 NOTE — Interval H&P Note (Signed)
History and Physical Interval Note:  08/07/2012 12:37 PM  [name]  has presented today for surgery, with the diagnosis of dysphagia  The various methods of treatment have been discussed with the patient and family. After consideration of risks, benefits and other options for treatment, the patient has consented to  Procedure(s) (LRB) with comments: ESOPHAGOGASTRODUODENOSCOPY (EGD) (N/A) BOTOX INJECTION (N/A) as a surgical intervention .  The patient's history has been reviewed, patient examined, no change in status, stable for surgery.  I have reviewed the patient's chart and labs.  Questions were answered to the patient's satisfaction.     The recent H&P (dated *08/02/12**) was reviewed, the patient was examined and there is no change in the patients condition since that H&P was completed.   Melvia Heaps  08/07/2012, 12:37 PM   Melvia Heaps

## 2012-08-07 NOTE — H&P (View-Only) (Signed)
History of Present Illness:  The patient returns following balloon dilatation of the distal esophagus. She was dilated to 15 mm. Subjectively she has had no change in her symptoms. She vomited up eggs from prior ingestion  24 hours previously. A followup barium esophagram demonstrated esophageal dysmotility, pooling of barium and lodging of a 13 mm barium tablet for greater than 5 minutes at the GE junction. She continues to lose weight because of fear of eating.      Review of Systems: Pertinent positive and negative review of systems were noted in the above HPI section. All other review of systems were otherwise negative.    Current Medications, Allergies, Past Medical History, Past Surgical History, Family History and Social History were reviewed in [facility] electronic medical record  Vital signs were reviewed in today's medical record. Physical Exam: General: Well developed , well nourished, no acute distress

## 2012-08-08 ENCOUNTER — Encounter (HOSPITAL_COMMUNITY): Payer: Self-pay | Admitting: Gastroenterology

## 2012-08-08 DIAGNOSIS — M25569 Pain in unspecified knee: Secondary | ICD-10-CM | POA: Diagnosis not present

## 2012-08-08 NOTE — Telephone Encounter (Signed)
Referral has been placed to pcc's.

## 2012-08-09 DIAGNOSIS — Z950 Presence of cardiac pacemaker: Secondary | ICD-10-CM | POA: Diagnosis not present

## 2012-08-10 DIAGNOSIS — M25569 Pain in unspecified knee: Secondary | ICD-10-CM | POA: Diagnosis not present

## 2012-08-11 DIAGNOSIS — M25569 Pain in unspecified knee: Secondary | ICD-10-CM | POA: Diagnosis not present

## 2012-08-15 ENCOUNTER — Other Ambulatory Visit: Payer: Self-pay | Admitting: Internal Medicine

## 2012-08-15 DIAGNOSIS — M25569 Pain in unspecified knee: Secondary | ICD-10-CM | POA: Diagnosis not present

## 2012-08-15 DIAGNOSIS — R221 Localized swelling, mass and lump, neck: Secondary | ICD-10-CM

## 2012-08-15 DIAGNOSIS — M353 Polymyalgia rheumatica: Secondary | ICD-10-CM | POA: Diagnosis not present

## 2012-08-15 DIAGNOSIS — Z950 Presence of cardiac pacemaker: Secondary | ICD-10-CM | POA: Diagnosis not present

## 2012-08-15 DIAGNOSIS — I10 Essential (primary) hypertension: Secondary | ICD-10-CM | POA: Diagnosis not present

## 2012-08-15 DIAGNOSIS — G4733 Obstructive sleep apnea (adult) (pediatric): Secondary | ICD-10-CM | POA: Diagnosis not present

## 2012-08-15 DIAGNOSIS — E041 Nontoxic single thyroid nodule: Secondary | ICD-10-CM | POA: Diagnosis not present

## 2012-08-17 DIAGNOSIS — J869 Pyothorax without fistula: Secondary | ICD-10-CM | POA: Insufficient documentation

## 2012-08-17 DIAGNOSIS — M25569 Pain in unspecified knee: Secondary | ICD-10-CM | POA: Diagnosis not present

## 2012-08-18 ENCOUNTER — Ambulatory Visit
Admission: RE | Admit: 2012-08-18 | Discharge: 2012-08-18 | Disposition: A | Discharge status: Home / Self Care | Payer: Medicare Other | Source: Ambulatory Visit | Attending: Internal Medicine | Admitting: Internal Medicine

## 2012-08-18 DIAGNOSIS — R221 Localized swelling, mass and lump, neck: Secondary | ICD-10-CM

## 2012-08-18 DIAGNOSIS — M25569 Pain in unspecified knee: Secondary | ICD-10-CM | POA: Diagnosis not present

## 2012-08-21 ENCOUNTER — Ambulatory Visit (INDEPENDENT_AMBULATORY_CARE_PROVIDER_SITE_OTHER): Payer: Medicare Other | Admitting: Gastroenterology

## 2012-08-21 ENCOUNTER — Encounter: Payer: Self-pay | Admitting: Gastroenterology

## 2012-08-21 VITALS — BP 118/78 | HR 91 | Ht 61.0 in | Wt 154.4 lb

## 2012-08-21 DIAGNOSIS — R1319 Other dysphagia: Secondary | ICD-10-CM | POA: Diagnosis not present

## 2012-08-21 DIAGNOSIS — M25569 Pain in unspecified knee: Secondary | ICD-10-CM | POA: Diagnosis not present

## 2012-08-21 NOTE — Patient Instructions (Addendum)
Follow up as needed Discontinue metoclopramide Call office if you develop recurrent problems with swallowing

## 2012-08-21 NOTE — Progress Notes (Signed)
History of Present Illness:  [name] has returned following Botox injection of the LES. She reports significant improvement in her swallowing. She is tolerating regular foods. She's had no coughing or pyrosis.    Review of Systems: Pertinent positive and negative review of systems were noted in the above HPI section. All other review of systems were otherwise negative.    Current Medications, Allergies, Past Medical History, Past Surgical History, Family History and Social History were reviewed in [employer] electronic medical record  Vital signs were reviewed in today's medical record. Physical Exam: General: Well developed , well nourished, no acute distress

## 2012-08-21 NOTE — Assessment & Plan Note (Addendum)
The patient has had an excellent response to Botox injection strongly suggesting that she in fact has achalasia.  Recommendations #1 discontinue metoclopramide #2 try reducing the omeprazole to 20 mg daily #3 repeat Botox injection as needed

## 2012-08-22 DIAGNOSIS — M25569 Pain in unspecified knee: Secondary | ICD-10-CM | POA: Diagnosis not present

## 2012-08-25 DIAGNOSIS — M25569 Pain in unspecified knee: Secondary | ICD-10-CM | POA: Diagnosis not present

## 2012-08-26 ENCOUNTER — Other Ambulatory Visit: Payer: Self-pay

## 2012-08-30 ENCOUNTER — Encounter: Payer: Self-pay | Admitting: Pulmonary Disease

## 2012-08-30 ENCOUNTER — Other Ambulatory Visit (INDEPENDENT_AMBULATORY_CARE_PROVIDER_SITE_OTHER): Payer: Medicare Other

## 2012-08-30 ENCOUNTER — Ambulatory Visit (INDEPENDENT_AMBULATORY_CARE_PROVIDER_SITE_OTHER): Payer: Medicare Other | Admitting: Pulmonary Disease

## 2012-08-30 ENCOUNTER — Ambulatory Visit (INDEPENDENT_AMBULATORY_CARE_PROVIDER_SITE_OTHER)
Admission: RE | Admit: 2012-08-30 | Discharge: 2012-08-30 | Disposition: A | Discharge status: Home / Self Care | Payer: Medicare Other | Source: Ambulatory Visit | Attending: Pulmonary Disease | Admitting: Pulmonary Disease

## 2012-08-30 VITALS — BP 112/64 | HR 96 | Temp 97.2°F | Ht 62.0 in | Wt 160.4 lb

## 2012-08-30 DIAGNOSIS — J90 Pleural effusion, not elsewhere classified: Secondary | ICD-10-CM | POA: Diagnosis not present

## 2012-08-30 DIAGNOSIS — R609 Edema, unspecified: Secondary | ICD-10-CM | POA: Diagnosis not present

## 2012-08-30 DIAGNOSIS — J869 Pyothorax without fistula: Secondary | ICD-10-CM

## 2012-08-30 DIAGNOSIS — K220 Achalasia of cardia: Secondary | ICD-10-CM

## 2012-08-30 DIAGNOSIS — T17908A Unspecified foreign body in respiratory tract, part unspecified causing other injury, initial encounter: Secondary | ICD-10-CM | POA: Diagnosis not present

## 2012-08-30 LAB — BASIC METABOLIC PANEL
Calcium: 9.3 mg/dL (ref 8.4–10.5)
GFR: 98 mL/min (ref >60.00)
Sodium: 141 mEq/L (ref 135–145)

## 2012-08-30 MED ORDER — AMOXICILLIN-POT CLAVULANATE 875-125 MG PO TABS: 1.0000 | ORAL_TABLET | Freq: Two times a day (BID) | ORAL | Status: DC

## 2012-08-30 MED ORDER — LEVOFLOXACIN 500 MG PO TABS: 500.0000 mg | ORAL_TABLET | Freq: Every evening | ORAL | Status: DC

## 2012-08-30 MED ORDER — FUROSEMIDE 40 MG PO TABS: 40.0000 mg | ORAL_TABLET | Freq: Every day | ORAL | Status: DC

## 2012-08-30 NOTE — Progress Notes (Signed)
Reason for visit: hospital follow up on RLL PNA and empyema    79 y.o. female with past medical history polymyalgia rheumatica on chronic steroids as well as pacemaker who presents to clinic for hospital follow up on RLL PNA and empyema .  Ms. Merrick was diagnosed with aspiration pneumonia secondary to esophageal dysmotility on 06/19/2012 and underwent VATS for empyema on 06/23/2012. Thoracentesis on 12/12 demonstrated an exudutive effusion/empyema and she underwent intubation and VATS procedure on 12/13. She required intubation and mechanical ventilation for 1 day and was extubated on 12/14. She had a chest tube in place from 12/13 to 12/16 which was removed without complication.   She received antibiotics for 10 days, 7 days IV while hospitalized and 3 days oral. She tolerated the antibiotics well with no side effects.   At discharge she required oxygen via nasal canula, 3L with rest and exertion due to respiratory insufficiency. During exertion, she desaturates to high 70s via pulse oximetry.  Cultures from her thoracentesis and her VATs remain negative to date for bacteria, AFB, fungi and yeast.   At previous visit she came in complaining of generalized weakness, dyspnea at rest but predominantly with exertion, cough productive of green sputum and hemoptysis (streaks associated with forceful cough). She denied fever, admited persistent RLE edema. She was seen in Dr. Dennie Maizes office 07/16/2012 and due to the above symptoms and fever, Levaquin was initiated. CXR performed at that time showed persistent pleural effusion with air fluid levels and RLL PNA. States that at home PT told her she would no longer require oxygen supplementation.   Manometry 12/13 showed no achalasia but esophageal stricture     Interim history Thoracentesis 07/25/12 with 65 cc puss; LDH>10000; no organisms seen;admitted to the hospital 07/25/12 for large bore empyema chest tube placement; 07/26/12 small chest tube placed by IR;  no drainage noticed; brode spectrum antibiotics IV started. Chest tube removed.   08/30/12 Improved level of activity, improved cough, no fever no chills. No side effects from Levaquin and Augmenetin. Does complain of LE edema with no chest pain or PND.   Has had EGD with balloon dilation 07/24/12, EGD with botox injection 08/07/12. Continues to take Reglan but has not noticed a significant improvement with this medication.  She did notice some improvement after Dilation and botox injection.       Allergies  Allergen Reactions  . Actonel (Risedronate Sodium)     Joint aches; rechallenged --caused joint aches  . Ivp Dye (Iodinated Diagnostic Agents) Nausea And Vomiting   Past Medical History  Diagnosis Date  . GERD (gastroesophageal reflux disease)   . Cataract   . Blood transfusion without reported diagnosis   . Empyema, right     VATS 06/23/2012 cultures negative to date CXR 07/19/12 persistent airfluid levels  . Polymyalgia rheumatica     on chronic Prednisone 5mg  daily  . Sleep apnea     CPAP at night  . Pacemaker     medtronic  . Neuromuscular disorder     polymyalgia rheumatica  . PNA (pneumonia)     aspiration   . Arthritis     back   Past Surgical History  Procedure Laterality Date  . Appendectomy    . Video bronchoscopy  06/23/2012    Procedure: VIDEO BRONCHOSCOPY;  Surgeon: Delight Ovens, MD;  Location: [facility] OR;  Service: Thoracic;  Laterality: N/A;  . Video assisted thoracoscopy (vats)/decortication  06/23/2012    Procedure: VIDEO ASSISTED THORACOSCOPY (VATS)/DECORTICATION;  Surgeon: Gwenith Daily  Tyrone Sage, MD;  Location: [facility] OR;  Service: Thoracic;  Laterality: Right;  . Esophagogastroduodenoscopy (egd) with esophageal dilation  06/27/2012    Procedure: ESOPHAGOGASTRODUODENOSCOPY (EGD) WITH ESOPHAGEAL DILATION;  Surgeon: Meryl Dare, MD,FACG;  Location: [facility] ENDOSCOPY;  Service: Endoscopy;  Laterality: N/A;  . Video assisted thoracoscopy (vats)/empyema       06/23/2012  . Eye surgery      bilateral cataract removal  . Joint replacement      right hip  . Esophagogastroduodenoscopy  07/24/2012    Procedure: ESOPHAGOGASTRODUODENOSCOPY (EGD);  Surgeon: Louis Meckel, MD;  Location: [facility]COPY;  Service: Endoscopy;  Laterality: N/A;  . Balloon dilation  07/24/2012    Procedure: BALLOON DILATION;  Surgeon: Louis Meckel, MD;  Location: [facility] ENDOSCOPY;  Service: Endoscopy;  Laterality: N/A;  . Esophagogastroduodenoscopy  08/07/2012    Procedure: ESOPHAGOGASTRODUODENOSCOPY (EGD);  Surgeon: Louis Meckel, MD;  Location: [facility];  Service: Endoscopy;  Laterality: N/A;  . Botox injection  08/07/2012    Procedure: BOTOX INJECTION;  Surgeon: Louis Meckel, MD;  Location: [facility] ENDOSCOPY;  Service: Endoscopy;  Laterality: N/A;   Family History  Problem Relation Age of Onset  . Arthritis Father   . Heart disease Father   . Cancer Brother   . Heart disease Brother   . Diabetes Son    Current Outpatient Prescriptions  Medication Sig Dispense Refill  . acetaminophen (TYLENOL) 325 MG tablet Take 650 mg by mouth 2 (two) times daily as needed. For pain      . amoxicillin-clavulanate (AUGMENTIN) 875-125 MG per tablet Take 1 tablet by mouth 2 (two) times daily.  60 tablet  3  . b complex vitamins tablet Take 1 tablet by mouth daily.      Marland Kitchen docusate sodium (COLACE) 100 MG capsule Take 100 mg by mouth daily as needed.       . hydrochlorothiazide (HYDRODIURIL) 25 MG tablet Take 25 mg by mouth daily.      Marland Kitchen ibuprofen (ADVIL,MOTRIN) 200 MG tablet Take 400 mg by mouth 2 (two) times daily as needed.      Marland Kitchen levofloxacin (LEVAQUIN) 500 MG tablet Take 1 tablet (500 mg total) by mouth every evening. Started 07/19/2012, 2 week course  30 tablet  3  . metoprolol succinate (TOPROL-XL) 25 MG 24 hr tablet Take 25 mg by mouth daily.      Marland Kitchen omeprazole (PRILOSEC) 40 MG capsule Take 1 capsule (40 mg total) by mouth daily.  30 capsule  0  . predniSONE (DELTASONE) 1 MG tablet 4  tabs x 7 days, then 3 tabs x 7 days, then 2 tabs x 7 days , then 1 tab daily and stay at that dose.  100 tablet  0  . Vitamins A & D (VITAMIN A & D PO) Take 1 capsule by mouth daily.      Marland Kitchen albuterol (PROVENTIL HFA;VENTOLIN HFA) 108 (90 BASE) MCG/ACT inhaler Inhale 2 puffs into the lungs 3 (three) times daily as needed. For shortness of breath or wheezing      . furosemide (LASIX) 40 MG tablet Take 1 tablet (40 mg total) by mouth daily.  10 tablet  0   No current facility-administered medications for this visit.   Review of Systems  Constitutional: Negativee for appetite change and unexpected weight change. Negative for fever.  HENT: Negative for ear pain, congestion, sore throat, sneezing and trouble swallowing. Negative for dental problem.  Positive for swallow difficulties; per HPI.  Respiratory:Per HPI  Cardiovascular:  Per HPI. Negative for palpitations.  Gastrointestinal: Negative for abdominal pain.  Musculoskeletal: Negative for joint swelling.  Skin: Negative for rash.  Neurological: Negative for numbness.  Psychiatric/Behavioral: Positive for insomnia   BP 112/64  Pulse 96  Temp(Src) 97.2 F (36.2 C) (Oral)  Ht 5\' 2"  (1.575 m)  Wt 160 lb 6.4 oz (72.757 kg)  BMI 29.33 kg/m2  SpO2 95% General: Comfortable. Obese HEENT ; pupils round and reactive to light; mild nasal mucosa edema and erythema; retropharyngeal crowding with no erythema, edema or exudate; no cervical LAD  Cardiovascular: s1s2, no murmurs. Regular rate and rhythm.  Respiratory: Decreased breath sounds bilaterally, R>L, no crackles No increased work of breathing.  Abdomen: soft NT ND BS+.   Extremities: RLE calf diameter>L. Homans negative;  Central nervous system: Alert and oriented No focal neurological deficits.     CXR 08/30/12 Stable small right pleural effusion. Decreasing right basilar  consolidation.  Cardiomegaly.  Stable esophageal dilatation.   LE dopplers 07/20/12 No evidence of DVT in the lower  extremities.   CTA 07/20/12  There is no pneumothorax. However, there is a loculated effusion  in the right base which contains air. The possibility of  bronchopleural fistula must be of concern. Infection in this fluid  in the right base also is possible. There is not appear to be a  well-formed abscess.  There is persistent right lower lobe consolidation.  Large hiatal hernia. Marked distension of the esophagus, increased  from prior study. Esophagus filled with food material.  No demonstrable pulmonary embolus.  There is a small pericardial effusion.  Multinodular goiter. Diffuse thyroid enlargement, stable.  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  Assessment and plan:  1. RLL PNA and R empyema most likely with recurrent aspiration due to intra esophageal reflux in context of LES stenosis and esophageal dilation. CXR today independently reviewed and with improved R parenchymal infiltrate.   Continue Levaquin and Augmentin . To continue antibiotics until CXR changes resolve. I told the patient and her daughter that it is expected that she will require a long course of antibiotics (possible even several months) I expect she will always have a component of trapped lung on the R.   Family requested to be referred for a second opinion in regards to empyema. She will follow up with CT surgery, Dr. Mikel Cella at [facility]; she looks more robust at this time, but given her co morbidities she is at high risk of complications with redo VATS; she might benefit from an empyema tube.    2. LES stenosis;swallowing difficulties seem to be improving after the GI interventions. She will continue to follow up with Dr. Arlyce Dice for botox injection. She was told to stop reglan; Since she has not seen a benefit while taking it, we will discontinue the medication.   3.LE edema; advised to avoid salt, elevate feet and take lasix daily for 2 days. LE dopplers were negative for DVT, CTA was neg for PE on 1/14  4. Follow up in  clinic in 4 weeks; At that time we will repeat CXR.  Vanetta Mulders, MD  [facility]  [place], [place]

## 2012-08-30 NOTE — Patient Instructions (Addendum)
Take Lasix 40 mg tablets once daily for 2 days. We will obtain a renal function panel to evaluate renal function; if leg edema does not resolve, please call us to order leg ultrasound.   Continue Levaquin and Augmentin. Stop Reglan.   We will refer you for pulmonary rehabilitation.   We will see you back in 1 month; at that time we will obtain a repeat CXR.

## 2012-09-01 DIAGNOSIS — J869 Pyothorax without fistula: Secondary | ICD-10-CM | POA: Diagnosis not present

## 2012-09-01 DIAGNOSIS — K220 Achalasia of cardia: Secondary | ICD-10-CM | POA: Diagnosis not present

## 2012-09-06 ENCOUNTER — Telehealth: Payer: Self-pay | Admitting: Pulmonary Disease

## 2012-09-06 NOTE — Telephone Encounter (Signed)
Victorino Dike,   Could you please schedule Mrs. Gallacher with Dr. Delford Field in approximately 3 weeks;

## 2012-09-07 LAB — AFB CULTURE WITH SMEAR (NOT AT ARMC)
Acid Fast Smear: NONE SEEN
Special Requests: NORMAL

## 2012-09-07 NOTE — Telephone Encounter (Signed)
error 

## 2012-09-19 DIAGNOSIS — R1310 Dysphagia, unspecified: Secondary | ICD-10-CM | POA: Diagnosis not present

## 2012-09-19 DIAGNOSIS — Z23 Encounter for immunization: Secondary | ICD-10-CM | POA: Diagnosis not present

## 2012-10-04 ENCOUNTER — Encounter: Payer: Self-pay | Admitting: Pulmonary Disease

## 2012-10-04 ENCOUNTER — Ambulatory Visit (INDEPENDENT_AMBULATORY_CARE_PROVIDER_SITE_OTHER)
Admission: RE | Admit: 2012-10-04 | Discharge: 2012-10-04 | Disposition: A | Discharge status: Home / Self Care | Payer: Medicare Other | Source: Ambulatory Visit | Attending: Pulmonary Disease | Admitting: Pulmonary Disease

## 2012-10-04 ENCOUNTER — Ambulatory Visit (INDEPENDENT_AMBULATORY_CARE_PROVIDER_SITE_OTHER): Payer: Medicare Other | Admitting: Pulmonary Disease

## 2012-10-04 VITALS — BP 106/60 | HR 90 | Temp 97.9°F | Ht 62.0 in | Wt 159.0 lb

## 2012-10-04 DIAGNOSIS — J189 Pneumonia, unspecified organism: Secondary | ICD-10-CM

## 2012-10-04 DIAGNOSIS — K220 Achalasia of cardia: Secondary | ICD-10-CM

## 2012-10-04 DIAGNOSIS — R609 Edema, unspecified: Secondary | ICD-10-CM

## 2012-10-04 DIAGNOSIS — J690 Pneumonitis due to inhalation of food and vomit: Secondary | ICD-10-CM | POA: Diagnosis not present

## 2012-10-04 DIAGNOSIS — M353 Polymyalgia rheumatica: Secondary | ICD-10-CM

## 2012-10-04 DIAGNOSIS — J90 Pleural effusion, not elsewhere classified: Secondary | ICD-10-CM | POA: Diagnosis not present

## 2012-10-04 DIAGNOSIS — J869 Pyothorax without fistula: Secondary | ICD-10-CM | POA: Diagnosis not present

## 2012-10-04 MED ORDER — FUROSEMIDE 40 MG PO TABS: 40.0000 mg | ORAL_TABLET | Freq: Every day | ORAL | Status: DC

## 2012-10-04 MED ORDER — LEVOFLOXACIN 500 MG PO TABS: 500.0000 mg | ORAL_TABLET | Freq: Every day | ORAL | Status: DC

## 2012-10-04 MED ORDER — AMOXICILLIN-POT CLAVULANATE 875-125 MG PO TABS: 1.0000 | ORAL_TABLET | Freq: Two times a day (BID) | ORAL | Status: DC

## 2012-10-04 NOTE — Patient Instructions (Addendum)
Take lasix 40 mg tablets daily for two days.   Continue antibiotics for 4 more weeks;   We will schedule a follow up appointment with Dr. Delford Field in 4 weeks. At that time we will obtain a repeat CXR,

## 2012-10-05 ENCOUNTER — Encounter: Payer: Self-pay | Admitting: Pulmonary Disease

## 2012-10-05 NOTE — Progress Notes (Signed)
Reason for visit: hospital follow up on RLL PNA and empyema    79 y.o. female with past medical history polymyalgia rheumatica on chronic steroids as well as pacemaker who presents to clinic for hospital follow up on RLL PNA and empyema .  Ms. Mccomber was diagnosed with aspiration pneumonia secondary to esophageal dysmotility on 06/19/2012 and underwent VATS for empyema on 06/23/2012. Thoracentesis on 12/12 demonstrated an exudutive effusion/empyema and she underwent intubation and VATS procedure on 12/13. She required intubation and mechanical ventilation for 1 day and was extubated on 12/14. She had a chest tube in place from 12/13 to 12/16 which was removed without complication.   Manometry 12/13 showed no achalasia but esophageal stricture     Thoracentesis 07/25/12 with 65 cc puss; LDH>10000; no organisms seen;admitted to the hospital 07/25/12 for large bore empyema chest tube placement; 07/26/12 small chest tube placed by IR; no drainage noticed;  antibiotics IV started. Chest tube removed. Discharged on oral antibiotics with plan to continue until resolution of CXR changes.   3/26//14 Improved level of activity, improved cough, no fever no chills. No side effects from Levaquin and Augmenetin. Does complain of LE edema with no chest pain or PND.   Has had EGD with balloon dilation 07/24/12, EGD with botox injection 08/07/12. Continues to take Reglan but has not noticed a significant improvement with this medication.  She did notice some improvement after Dilation and botox injection.   Cultures from her thoracentesis and her VATs remain negative to date for bacteria, AFB, fungi and yeast.     Allergies  Allergen Reactions  . Actonel (Risedronate Sodium)     Joint aches; rechallenged --caused joint aches  . Ivp Dye (Iodinated Diagnostic Agents) Nausea And Vomiting   Past Medical History  Diagnosis Date  . GERD (gastroesophageal reflux disease)   . Cataract   . Blood transfusion without  reported diagnosis   . Empyema, right     VATS 06/23/2012 cultures negative to date CXR 07/19/12 persistent airfluid levels  . Polymyalgia rheumatica     on chronic Prednisone 5mg  daily  . Sleep apnea     CPAP at night  . Pacemaker     medtronic  . Neuromuscular disorder     polymyalgia rheumatica  . PNA (pneumonia)     aspiration   . Arthritis     back  . Hypertension    Past Surgical History  Procedure Laterality Date  . Video bronchoscopy  06/23/2012    Procedure: VIDEO BRONCHOSCOPY;  Surgeon: Delight Ovens, MD;  Location: [facility] OR;  Service: Thoracic;  Laterality: N/A;  . Video assisted thoracoscopy (vats)/decortication  06/23/2012    Procedure: VIDEO ASSISTED THORACOSCOPY (VATS)/DECORTICATION;  Surgeon: Delight Ovens, MD;  Location: [facility] OR;  Service: Thoracic;  Laterality: Right;  . Esophagogastroduodenoscopy (egd) with esophageal dilation  06/27/2012    Procedure: ESOPHAGOGASTRODUODENOSCOPY (EGD) WITH ESOPHAGEAL DILATION;  Surgeon: Meryl Dare, MD,FACG;  Location: [facility] ENDOSCOPY;  Service: Endoscopy;  Laterality: N/A;  . Video assisted thoracoscopy (vats)/empyema      06/23/2012  . Esophagogastroduodenoscopy  07/24/2012    Procedure: ESOPHAGOGASTRODUODENOSCOPY (EGD);  Surgeon: Louis Meckel, MD;  Location: [facility];  Service: Endoscopy;  Laterality: N/A;  . Balloon dilation  07/24/2012    Procedure: BALLOON DILATION;  Surgeon: Louis Meckel, MD;  Location: [facility] ENDOSCOPY;  Service: Endoscopy;  Laterality: N/A;  . Esophagogastroduodenoscopy  08/07/2012    Procedure: ESOPHAGOGASTRODUODENOSCOPY (EGD);  Surgeon: Louis Meckel, MD;  Location: [facility] ENDOSCOPY;  Service: Endoscopy;  Laterality: N/A;  . Botox injection  08/07/2012    Procedure: BOTOX INJECTION;  Surgeon: Louis Meckel, MD;  Location: [facility] ENDOSCOPY;  Service: Endoscopy;  Laterality: N/A;  . Eye surgery Bilateral 2005    Cataract removal  . Appendectomy  1953    w/ benign abd tumor removal  . Tonsillectomy   Childhood  . Cardiac pacemaker placement  06/1999    for CHB  . Pacer battery change  2009  . Joint replacement Right 2001    Hip, for OA   Family History  Problem Relation Age of Onset  . Arthritis Father   . Heart disease Father   . Cancer Brother   . Heart disease Brother   . Diabetes Son   . Heart disease Mother     Congestive heart failure  . Heart disease Brother   . Arthritis Brother   . Hypertension Brother    Current Outpatient Prescriptions  Medication Sig Dispense Refill  . acetaminophen (TYLENOL) 325 MG tablet Take 650 mg by mouth 2 (two) times daily as needed. For pain      . albuterol (PROVENTIL HFA;VENTOLIN HFA) 108 (90 BASE) MCG/ACT inhaler Inhale 2 puffs into the lungs 3 (three) times daily as needed. For shortness of breath or wheezing      . amoxicillin-clavulanate (AUGMENTIN) 875-125 MG per tablet Take 1 tablet by mouth 2 (two) times daily.  60 tablet  3  . b complex vitamins tablet Take 1 tablet by mouth daily.      Marland Kitchen docusate sodium (COLACE) 100 MG capsule Take 100 mg by mouth daily as needed.       . hydrochlorothiazide (HYDRODIURIL) 25 MG tablet Take 25 mg by mouth daily.      Marland Kitchen ibuprofen (ADVIL,MOTRIN) 200 MG tablet Take 400 mg by mouth 2 (two) times daily as needed.      . metoprolol succinate (TOPROL-XL) 25 MG 24 hr tablet Take 25 mg by mouth daily.      Marland Kitchen omeprazole (PRILOSEC) 40 MG capsule Take 1 capsule (40 mg total) by mouth daily.  30 capsule  0  . predniSONE (DELTASONE) 1 MG tablet 1 mg. 4 tabs daily.      . Vitamins A & D (VITAMIN A & D PO) Take 1 capsule by mouth daily.      . furosemide (LASIX) 40 MG tablet Take 1 tablet (40 mg total) by mouth daily.  30 tablet  0  . levofloxacin (LEVAQUIN) 500 MG tablet Take 1 tablet (500 mg total) by mouth daily.  30 tablet  0   No current facility-administered medications for this visit.   Review of Systems  Constitutional: Negativee for appetite change and unexpected weight change. Negative for fever.   HENT: Negative for ear pain, congestion, sore throat, sneezing and trouble swallowing. Negative for dental problem.  Positive for swallow difficulties; per HPI.  Respiratory:Per HPI  Cardiovascular: Per HPI. Negative for palpitations.  Gastrointestinal: Negative for abdominal pain.  Musculoskeletal: Negative for joint swelling.  Skin: Negative for rash.  Neurological: Negative for numbness.  Psychiatric/Behavioral: Positive for insomnia   BP 106/60  Pulse 90  Temp(Src) 97.9 F (36.6 C) (Oral)  Ht 5\' 2"  (1.575 m)  Wt 159 lb (72.122 kg)  BMI 29.07 kg/m2  SpO2 95% General: Comfortable. Obese HEENT ; pupils round and reactive to light; mild nasal mucosa edema and erythema; retropharyngeal crowding with no erythema, edema or exudate; no cervical LAD  Cardiovascular: s1s2, no murmurs. Regular  rate and rhythm.  Respiratory: Decreased breath sounds bilaterally, R>L, no crackles No increased work of breathing.  Abdomen: soft NT ND BS+.   Extremities: RLE calf diameter>L. Homans negative;  Central nervous system: Alert and oriented No focal neurological deficits.     CXR 08/30/12 Stable small right pleural effusion. Decreasing right basilar  consolidation.  Cardiomegaly.  Stable esophageal dilatation.   LE dopplers 07/20/12 No evidence of DVT in the lower extremities.   CTA 07/20/12  There is no pneumothorax. However, there is a loculated effusion  in the right base which contains air. The possibility of  bronchopleural fistula must be of concern. Infection in this fluid  in the right base also is possible. There is not appear to be a  well-formed abscess.  There is persistent right lower lobe consolidation.  Large hiatal hernia. Marked distension of the esophagus, increased  from prior study. Esophagus filled with food material.  No demonstrable pulmonary embolus.  There is a small pericardial effusion.  Multinodular goiter. Diffuse thyroid enlargement, stable.  Hemangioma in the  anterior spleen.  Assessment and plan:  1.  RLL aspiration PNA resolved; R empyema significantly improved in CXR. most likely with recurrent aspiration due to intra esophageal reflux in context of LES stenosis and esophageal dilation. CXR today independently reviewed and with improved R parenchymal infiltrate.   Continue Levaquin and Augmentin (has been on antibiotics since 07/25/12). To continue antibiotics until CXR changes resolve. I expect she will always have a component of trapped lung on the R. She might be able to stop the antibiotics with next visit.   2. LES stenosis;swallowing difficulties seem to be improving after the GI interventions. She will continue to follow up with GI.   3.LE edema; advised to avoid salt, elevate feet and take lasix daily for 2 days. LE dopplers were negative for DVT, CTA was neg for PE on 1/14  4. Follow up in clinic in 4 weeks; At that time we will repeat CXR. Might be able to stop the antibiotics at that time.   Vanetta Mulders, MD  [facility]  [place], [place]

## 2012-10-09 DIAGNOSIS — D649 Anemia, unspecified: Secondary | ICD-10-CM | POA: Diagnosis not present

## 2012-10-09 DIAGNOSIS — I10 Essential (primary) hypertension: Secondary | ICD-10-CM | POA: Diagnosis not present

## 2012-10-09 DIAGNOSIS — E041 Nontoxic single thyroid nodule: Secondary | ICD-10-CM | POA: Diagnosis not present

## 2012-10-10 DIAGNOSIS — K220 Achalasia of cardia: Secondary | ICD-10-CM | POA: Diagnosis not present

## 2012-10-17 ENCOUNTER — Non-Acute Institutional Stay: Payer: Medicare Other | Admitting: Internal Medicine

## 2012-10-17 ENCOUNTER — Encounter: Payer: Self-pay | Admitting: Internal Medicine

## 2012-10-17 VITALS — BP 114/62 | HR 72 | Ht 60.0 in | Wt 157.0 lb

## 2012-10-17 DIAGNOSIS — K220 Achalasia of cardia: Secondary | ICD-10-CM | POA: Diagnosis not present

## 2012-10-17 DIAGNOSIS — J189 Pneumonia, unspecified organism: Secondary | ICD-10-CM

## 2012-10-17 DIAGNOSIS — J869 Pyothorax without fistula: Secondary | ICD-10-CM

## 2012-10-17 DIAGNOSIS — K2100 Gastro-esophageal reflux disease with esophagitis, without bleeding: Secondary | ICD-10-CM

## 2012-10-17 DIAGNOSIS — M353 Polymyalgia rheumatica: Secondary | ICD-10-CM

## 2012-10-17 DIAGNOSIS — IMO0001 Reserved for inherently not codable concepts without codable children: Secondary | ICD-10-CM

## 2012-10-17 DIAGNOSIS — Z950 Presence of cardiac pacemaker: Secondary | ICD-10-CM

## 2012-10-17 DIAGNOSIS — G4730 Sleep apnea, unspecified: Secondary | ICD-10-CM

## 2012-10-17 DIAGNOSIS — J690 Pneumonitis due to inhalation of food and vomit: Secondary | ICD-10-CM

## 2012-10-17 DIAGNOSIS — I10 Essential (primary) hypertension: Secondary | ICD-10-CM | POA: Diagnosis not present

## 2012-10-17 DIAGNOSIS — E041 Nontoxic single thyroid nodule: Secondary | ICD-10-CM

## 2012-10-17 DIAGNOSIS — M797 Fibromyalgia: Secondary | ICD-10-CM

## 2012-10-17 MED ORDER — PREDNISONE 5 MG PO TABS: ORAL_TABLET | ORAL | Status: DC

## 2012-10-17 NOTE — Assessment & Plan Note (Signed)
Had manometry at [facility] by Dr. Artis Flock.

## 2012-10-17 NOTE — Assessment & Plan Note (Signed)
Flare recently. Increasing to 5 mg prednisone daily was not helpful;Marland KitchenShe is asking for a higher dose.

## 2012-10-18 ENCOUNTER — Encounter: Payer: Self-pay | Admitting: Internal Medicine

## 2012-11-01 ENCOUNTER — Encounter: Payer: Self-pay | Admitting: Pulmonary Disease

## 2012-11-01 ENCOUNTER — Ambulatory Visit (INDEPENDENT_AMBULATORY_CARE_PROVIDER_SITE_OTHER)
Admission: RE | Admit: 2012-11-01 | Discharge: 2012-11-01 | Disposition: A | Discharge status: Home / Self Care | Payer: Medicare Other | Source: Ambulatory Visit | Attending: Pulmonary Disease | Admitting: Pulmonary Disease

## 2012-11-01 ENCOUNTER — Other Ambulatory Visit: Payer: Self-pay | Admitting: Pulmonary Disease

## 2012-11-01 ENCOUNTER — Ambulatory Visit (INDEPENDENT_AMBULATORY_CARE_PROVIDER_SITE_OTHER): Payer: Medicare Other | Admitting: Pulmonary Disease

## 2012-11-01 VITALS — BP 110/68 | HR 86 | Temp 98.2°F | Ht 62.0 in | Wt 159.4 lb

## 2012-11-01 DIAGNOSIS — J189 Pneumonia, unspecified organism: Secondary | ICD-10-CM

## 2012-11-01 DIAGNOSIS — J9819 Other pulmonary collapse: Secondary | ICD-10-CM | POA: Diagnosis not present

## 2012-11-01 DIAGNOSIS — J869 Pyothorax without fistula: Secondary | ICD-10-CM

## 2012-11-01 DIAGNOSIS — J90 Pleural effusion, not elsewhere classified: Secondary | ICD-10-CM | POA: Diagnosis not present

## 2012-11-01 NOTE — Assessment & Plan Note (Signed)
The patient is doing well from a pulmonary standpoint after prolonged course of antibiotics for empyema associated with aspiration pneumonia.  She feels that she is nearly back to baseline, has no significant pleuritic chest pain, and her chest x-ray shows minimal pleural thickening remaining.  I've asked her to discontinue her antibiotics, and to call us if she has further issues.

## 2012-11-01 NOTE — Patient Instructions (Addendum)
Would stop antibiotics. followup with [place] as needed.

## 2012-11-01 NOTE — Progress Notes (Signed)
  Subjective:    Patient ID: [name], female    DOB: 05/30/1929, 79 y.o.   MRN: [id number]  HPI The patient comes in today for followup of her recent pneumonia/empyema.  She was placed on a prolonged course of antibiotics, and has done well.  She currently has no significant pleuritic chest pain, nor has she had any fevers or chills.  She has no further cough or purulent mucus.  Her chest x-ray today shows minimal persistent right effusion.  And also minimal right basilar atelectasis.   Review of Systems  Constitutional: Negative for fever and unexpected weight change.  HENT: Positive for congestion, rhinorrhea, sneezing and postnasal drip. Negative for ear pain, nosebleeds, sore throat, trouble swallowing, dental problem and sinus pressure.   Eyes: Negative for redness and itching.  Respiratory: Negative for cough, chest tightness, shortness of breath and wheezing.   Cardiovascular: Positive for leg swelling. Negative for palpitations.  Gastrointestinal: Negative for nausea and vomiting.  Genitourinary: Negative for dysuria.  Musculoskeletal: Negative for joint swelling.  Skin: Negative for rash.  Neurological: Negative for headaches.  Hematological: Does not bruise/bleed easily.  Psychiatric/Behavioral: Negative for dysphoric mood. The patient is not nervous/anxious.        Objective:   Physical Exam Obese female in no acute distress Nose without purulence or discharge noted Neck without lymphadenopathy or thyromegaly Chest with minimal left basilar crackles, no wheezing Cardiac exam with regular rate and rhythm, 2/6 systolic murmur Lower extremities with 1+ edema, no cyanosis Alert and oriented, moves all 4 extremities.       Assessment & Plan:

## 2012-11-15 DIAGNOSIS — Z950 Presence of cardiac pacemaker: Secondary | ICD-10-CM | POA: Diagnosis not present

## 2012-11-21 ENCOUNTER — Encounter: Payer: Self-pay | Admitting: Internal Medicine

## 2012-11-21 ENCOUNTER — Non-Acute Institutional Stay: Payer: Medicare Other | Admitting: Internal Medicine

## 2012-11-21 VITALS — BP 100/58 | HR 72 | Wt 156.0 lb

## 2012-11-21 DIAGNOSIS — D649 Anemia, unspecified: Secondary | ICD-10-CM

## 2012-11-21 DIAGNOSIS — K2100 Gastro-esophageal reflux disease with esophagitis, without bleeding: Secondary | ICD-10-CM

## 2012-11-21 DIAGNOSIS — E669 Obesity, unspecified: Secondary | ICD-10-CM | POA: Insufficient documentation

## 2012-11-21 DIAGNOSIS — R7309 Other abnormal glucose: Secondary | ICD-10-CM | POA: Insufficient documentation

## 2012-11-21 DIAGNOSIS — Z8679 Personal history of other diseases of the circulatory system: Secondary | ICD-10-CM | POA: Insufficient documentation

## 2012-11-21 DIAGNOSIS — I10 Essential (primary) hypertension: Secondary | ICD-10-CM | POA: Diagnosis not present

## 2012-11-21 DIAGNOSIS — Z950 Presence of cardiac pacemaker: Secondary | ICD-10-CM

## 2012-11-21 DIAGNOSIS — R32 Unspecified urinary incontinence: Secondary | ICD-10-CM | POA: Diagnosis not present

## 2012-11-21 DIAGNOSIS — G4733 Obstructive sleep apnea (adult) (pediatric): Secondary | ICD-10-CM

## 2012-11-21 DIAGNOSIS — R609 Edema, unspecified: Secondary | ICD-10-CM | POA: Insufficient documentation

## 2012-11-21 DIAGNOSIS — E041 Nontoxic single thyroid nodule: Secondary | ICD-10-CM

## 2012-11-21 DIAGNOSIS — M899 Disorder of bone, unspecified: Secondary | ICD-10-CM | POA: Insufficient documentation

## 2012-11-21 DIAGNOSIS — G4730 Sleep apnea, unspecified: Secondary | ICD-10-CM | POA: Insufficient documentation

## 2012-11-21 DIAGNOSIS — J690 Pneumonitis due to inhalation of food and vomit: Secondary | ICD-10-CM

## 2012-11-21 DIAGNOSIS — M353 Polymyalgia rheumatica: Secondary | ICD-10-CM

## 2012-11-21 MED ORDER — METOPROLOL SUCCINATE ER 25 MG PO TB24: ORAL_TABLET | ORAL | Status: DC

## 2012-11-21 NOTE — Progress Notes (Signed)
  Subjective:    Patient ID: [name], female    DOB: 1928-12-07, 79 y.o.   MRN: [id number]  HPI  Polmyalgia is under control with 5 mg prednisone.  Using lasix occ. For edema.  Anemia, unspecified - nedds CBC with Differential  Nontoxic uninodular goiter - Plan: TSH to follow  Unspecified urinary incontinence:   She has had urinary incontinence. Using Oxytrol OTC 3.9 mg patch q4d. Helps a little. Using HCTZ for BP control.  Unspecified essential hypertension  Controlled  Reflux esophagitis  Using omeprazole for indigestion. Sometimes using antiacid like Tums some mornings as well.  Pneumonitis due to inhalation of food or vomitus  Off all antibiotics  Obstructive sleep apnea (adult) (pediatric)  Sometimes hast pain in the right jaw on awakening. She thinks it has something to do with her mask she uses for her OSA.  Cardiac pacemaker in situ: stable   Review of Systems  Constitutional: Positive for fatigue. Negative for fever, chills, diaphoresis, activity change, appetite change and unexpected weight change.  HENT: Negative.   Eyes: Negative.   Respiratory: Negative for cough, chest tightness, shortness of breath and wheezing.   Cardiovascular: Negative for chest pain, palpitations and leg swelling.  Endocrine: Negative.   Genitourinary: Positive for urgency and frequency. Negative for difficulty urinating.  Musculoskeletal: Positive for myalgias and arthralgias.  Skin: Negative.   Neurological: Negative for dizziness, tremors, seizures, syncope, speech difficulty, light-headedness and headaches.  Hematological: Negative.   Psychiatric/Behavioral: Negative.        Objective:   Physical Exam  Constitutional: She is oriented to person, place, and time.  Overweight, frail, elderly female.  HENT:  Head: Normocephalic and atraumatic.  Right Ear: External ear normal.  Left Ear: External ear normal.  Eyes:  Prescription lenses.  Neck: Normal range of motion.  Neck supple. No JVD present. No tracheal deviation present. No thyromegaly present.  Cardiovascular: Normal rate, regular rhythm, normal heart sounds and intact distal pulses.  Exam reveals no gallop and no friction rub.   No murmur heard. Pulmonary/Chest: No respiratory distress. She has no wheezes. She has rales. She exhibits no tenderness.  Abdominal: Soft. Bowel sounds are normal. She exhibits no distension and no mass. There is no tenderness.  Musculoskeletal: Normal range of motion. She exhibits no edema and no tenderness.  Unstable gait  Lymphadenopathy:    She has no cervical adenopathy.  Neurological: She is alert and oriented to person, place, and time. No cranial nerve deficit. Coordination normal.  Skin: Skin is warm and dry. No rash noted. No erythema. No pallor.  Psychiatric: She has a normal mood and affect. Her behavior is normal. Thought content normal.      Lab reports  10/09/12 CBC: normal  CMP: normal  TSH: 0.881    Assessment & Plan:  Anemia, unspecified -   Plan: CBC with Differential  Nontoxic uninodular goiter -   Plan: TSH  Unspecified urinary incontinence  Continue oxybutynin over-the-counter 3.9 mg patch every 4 days. Go to exercise classes to learn [employer] exercise. Discontinue HCTZ  Unspecified essential hypertension  Controlled   Reflux esophagitis  Controlled   Pneumonitis due to inhalation of food or vomitus  Resolved   Obstructive sleep apnea (adult) (pediatric)  Continue use of CPAP   Cardiac pacemaker in situ  Functioning appropriately   Polymyalgia rheumatica  Previous attempts at dose reduction failed. Leave her on 5 mg prednisone

## 2012-11-21 NOTE — Patient Instructions (Addendum)
Continue current medications. 

## 2012-11-23 DIAGNOSIS — D649 Anemia, unspecified: Secondary | ICD-10-CM | POA: Diagnosis not present

## 2012-11-23 DIAGNOSIS — E041 Nontoxic single thyroid nodule: Secondary | ICD-10-CM | POA: Diagnosis not present

## 2012-12-12 DIAGNOSIS — R32 Unspecified urinary incontinence: Secondary | ICD-10-CM | POA: Diagnosis not present

## 2012-12-12 DIAGNOSIS — M6281 Muscle weakness (generalized): Secondary | ICD-10-CM | POA: Diagnosis not present

## 2012-12-13 DIAGNOSIS — R32 Unspecified urinary incontinence: Secondary | ICD-10-CM | POA: Diagnosis not present

## 2012-12-13 DIAGNOSIS — M6281 Muscle weakness (generalized): Secondary | ICD-10-CM | POA: Diagnosis not present

## 2012-12-14 DIAGNOSIS — M6281 Muscle weakness (generalized): Secondary | ICD-10-CM | POA: Diagnosis not present

## 2012-12-14 DIAGNOSIS — R32 Unspecified urinary incontinence: Secondary | ICD-10-CM | POA: Diagnosis not present

## 2012-12-18 DIAGNOSIS — R32 Unspecified urinary incontinence: Secondary | ICD-10-CM | POA: Diagnosis not present

## 2012-12-18 DIAGNOSIS — M6281 Muscle weakness (generalized): Secondary | ICD-10-CM | POA: Diagnosis not present

## 2012-12-21 DIAGNOSIS — R32 Unspecified urinary incontinence: Secondary | ICD-10-CM | POA: Diagnosis not present

## 2012-12-21 DIAGNOSIS — M6281 Muscle weakness (generalized): Secondary | ICD-10-CM | POA: Diagnosis not present

## 2012-12-25 DIAGNOSIS — R32 Unspecified urinary incontinence: Secondary | ICD-10-CM | POA: Diagnosis not present

## 2012-12-25 DIAGNOSIS — M6281 Muscle weakness (generalized): Secondary | ICD-10-CM | POA: Diagnosis not present

## 2012-12-27 ENCOUNTER — Encounter: Payer: Self-pay | Admitting: Internal Medicine

## 2012-12-27 DIAGNOSIS — M6281 Muscle weakness (generalized): Secondary | ICD-10-CM | POA: Diagnosis not present

## 2012-12-27 DIAGNOSIS — R32 Unspecified urinary incontinence: Secondary | ICD-10-CM | POA: Diagnosis not present

## 2012-12-29 DIAGNOSIS — R32 Unspecified urinary incontinence: Secondary | ICD-10-CM | POA: Diagnosis not present

## 2012-12-29 DIAGNOSIS — M6281 Muscle weakness (generalized): Secondary | ICD-10-CM | POA: Diagnosis not present

## 2013-01-01 DIAGNOSIS — R32 Unspecified urinary incontinence: Secondary | ICD-10-CM | POA: Diagnosis not present

## 2013-01-01 DIAGNOSIS — M6281 Muscle weakness (generalized): Secondary | ICD-10-CM | POA: Diagnosis not present

## 2013-01-03 DIAGNOSIS — M6281 Muscle weakness (generalized): Secondary | ICD-10-CM | POA: Diagnosis not present

## 2013-01-03 DIAGNOSIS — R32 Unspecified urinary incontinence: Secondary | ICD-10-CM | POA: Diagnosis not present

## 2013-01-05 DIAGNOSIS — R32 Unspecified urinary incontinence: Secondary | ICD-10-CM | POA: Diagnosis not present

## 2013-01-05 DIAGNOSIS — M6281 Muscle weakness (generalized): Secondary | ICD-10-CM | POA: Diagnosis not present

## 2013-01-05 DIAGNOSIS — I495 Sick sinus syndrome: Secondary | ICD-10-CM | POA: Insufficient documentation

## 2013-01-08 DIAGNOSIS — M6281 Muscle weakness (generalized): Secondary | ICD-10-CM | POA: Diagnosis not present

## 2013-01-08 DIAGNOSIS — R32 Unspecified urinary incontinence: Secondary | ICD-10-CM | POA: Diagnosis not present

## 2013-01-10 DIAGNOSIS — R32 Unspecified urinary incontinence: Secondary | ICD-10-CM | POA: Diagnosis not present

## 2013-01-15 DIAGNOSIS — R32 Unspecified urinary incontinence: Secondary | ICD-10-CM | POA: Diagnosis not present

## 2013-01-19 DIAGNOSIS — R32 Unspecified urinary incontinence: Secondary | ICD-10-CM | POA: Diagnosis not present

## 2013-01-22 DIAGNOSIS — R32 Unspecified urinary incontinence: Secondary | ICD-10-CM | POA: Diagnosis not present

## 2013-01-24 DIAGNOSIS — N952 Postmenopausal atrophic vaginitis: Secondary | ICD-10-CM | POA: Diagnosis not present

## 2013-01-24 DIAGNOSIS — N3642 Intrinsic sphincter deficiency (ISD): Secondary | ICD-10-CM | POA: Diagnosis not present

## 2013-01-24 DIAGNOSIS — N3946 Mixed incontinence: Secondary | ICD-10-CM | POA: Diagnosis not present

## 2013-01-26 DIAGNOSIS — R32 Unspecified urinary incontinence: Secondary | ICD-10-CM | POA: Diagnosis not present

## 2013-01-28 ENCOUNTER — Encounter: Payer: Self-pay | Admitting: Internal Medicine

## 2013-01-28 DIAGNOSIS — E041 Nontoxic single thyroid nodule: Secondary | ICD-10-CM | POA: Insufficient documentation

## 2013-01-28 DIAGNOSIS — M797 Fibromyalgia: Secondary | ICD-10-CM | POA: Insufficient documentation

## 2013-01-28 NOTE — Patient Instructions (Signed)
Continue current medications. 

## 2013-01-28 NOTE — Progress Notes (Signed)
Subjective:    Patient ID: [name], female    DOB: 1928/12/20, 79 y.o.   MRN: [id number]  HPI 29 year old resident [facility] with complicated medical history presents today for routine followup of several medical issues.  Her main concern today is that her polymyalgia rheumatica is "flaring". She is having much more musculoskeletal discomfort that she has had in the last several weeks. This is been treated with prednisone. Dr. Frederico Hamman recently increased her prednisone.  Patient has a 2 x 1 cm lump in the right neck which appears to been first noted in December 2013. This consistent with a multinodular goiter. Patient does not feel it is enlarging and she does not think it has increased in tenderness.  Megaesophagus: Associated with achalasia. Patient had repeat manometry to in April 2014. It reconfirmed the achalasia.  Essential hypertension: Controlled  Reflux esophagitis: Associated with achalasia and megaesophagus  Sleep apnea: Uses CPAP since 2003  Aspiration pneumonia: Complicated problem that began in December 2013. She developed empyema. She required VATS procedure. She was rehospitalized in January 2014. She had drainage by thoracentesis of a large amount of purulent fluid.  Pacemaker: Functioning well  Empyema: Hopefully this is resolved.  Current Outpatient Prescriptions on File Prior to Visit  Medication Sig Dispense Refill  . b complex vitamins tablet Take 1 tablet by mouth daily.      Marland Kitchen omeprazole (PRILOSEC) 40 MG capsule Take 1 capsule (40 mg total) by mouth daily.  30 capsule  0  . Vitamins A & D (VITAMIN A & D PO) Take 1 capsule by mouth daily.             Review of Systems  Constitutional: Positive for activity change, appetite change and fatigue. Negative for fever.  Eyes: Negative.   Respiratory:       Aspiration pneumonia December 2013 complicated by subsequent empyema.  Cardiovascular: Positive for leg swelling. Negative for chest pain and  palpitations.  Gastrointestinal:       Known achalasia and megaesophagus. Some dysphagia.   Endocrine: Negative.   Genitourinary:       Chronic urinary leakage. Wears protective pads for at least 10 years. She has seen urologist in the past. Sling surgery was proposed, but she did not want to go through this.  Musculoskeletal: Positive for myalgias. Negative for back pain and gait problem.       Diffuse muscular pains, worse in the shoulders.  Skin: Negative.   Allergic/Immunologic: Negative.   Neurological: Negative.   Hematological:       History of anemia.  Psychiatric/Behavioral: Negative.        Objective:BP 114/62  Pulse 72  Ht 5' (1.524 m)  Wt 157 lb (71.215 kg)  BMI 30.66 kg/m2    Physical Exam  Constitutional: She is oriented to person, place, and time. She appears well-developed and well-nourished. No distress.  HENT:  Head: Normocephalic and atraumatic.  Right Ear: External ear normal.  Left Ear: External ear normal.  Nose: Nose normal.  Mouth/Throat: Oropharynx is clear and moist.  Eyes:  Corrective lenses worn.  Neck: Thyromegaly present.  Multinodular goiter with nodules palpable in all 4 limbs. Cyst in the right upper lobe of the thyroid is particularly large.  Cardiovascular: Normal rate, regular rhythm and intact distal pulses.  Exam reveals friction rub. Exam reveals no gallop.   No murmur heard. Pulmonary/Chest: Effort normal and breath sounds normal. No respiratory distress. She has no wheezes. She has no rales. She exhibits  no tenderness.  Abdominal: Soft. Bowel sounds are normal. She exhibits no distension and no mass. There is no tenderness.  Musculoskeletal: Normal range of motion. She exhibits no edema and no tenderness.  Left leg is shorter than the right.  Neurological: She is alert and oriented to person, place, and time. She has normal reflexes. No cranial nerve deficit. Coordination normal.  Skin: No rash noted. No erythema.  Psychiatric: She  has a normal mood and affect. Her behavior is normal. Judgment and thought content normal.     Appointment on 08/30/2012  Component Date Value Range Status  . Sodium 08/30/2012 141  135 - 145 mEq/L Final  . Potassium 08/30/2012 3.7  3.5 - 5.1 mEq/L Final  . Chloride 08/30/2012 104  96 - 112 mEq/L Final  . CO2 08/30/2012 31  19 - 32 mEq/L Final  . Glucose, Bld 08/30/2012 101* 70 - 99 mg/dL Final  . BUN 05/08/2535 19  6 - 23 mg/dL Final  . Creatinine, Ser 08/30/2012 0.6  0.4 - 1.2 mg/dL Final  . Calcium 64/40/3474 9.3  8.4 - 10.5 mg/dL Final  . GFR 25/95/6387 98.00  >60.00 mL/min Final   10/09/2012 CBC: Hemoglobin 12.3, WBC 5800, platelets 110,000  CMP glucose 100, BUN 26, creatinine 0.61, otherwise normal  TSH 0.881      Assessment & Plan:  Polymyalgia rheumatica: Continue higher dose prednisone for the time being.  Megaesophagus: Associated with achalasia and aspiration risk  Essential hypertension: Controlled  Reflux esophagitis: Controlled  Achalasia of esophagus: Chronic issue related to potential for aspiration. Has had Botox injection by Dr. Melvia Heaps. [facility]  Sleep apnea: Continue use of CPAP  Aspiration pneumonia: Appears to be making a good recovery from this problem of December 2013  Pacemaker: Functioning appropriately  PNA (pneumonia): Resolving aspiration pneumonia  Empyema: Resolved

## 2013-01-30 DIAGNOSIS — R32 Unspecified urinary incontinence: Secondary | ICD-10-CM | POA: Diagnosis not present

## 2013-01-31 DIAGNOSIS — I495 Sick sinus syndrome: Secondary | ICD-10-CM | POA: Diagnosis not present

## 2013-02-01 DIAGNOSIS — R32 Unspecified urinary incontinence: Secondary | ICD-10-CM | POA: Diagnosis not present

## 2013-02-12 DIAGNOSIS — N3946 Mixed incontinence: Secondary | ICD-10-CM | POA: Diagnosis not present

## 2013-02-17 ENCOUNTER — Other Ambulatory Visit: Payer: Self-pay | Admitting: Internal Medicine

## 2013-02-28 DIAGNOSIS — N3946 Mixed incontinence: Secondary | ICD-10-CM | POA: Diagnosis not present

## 2013-02-28 DIAGNOSIS — N3642 Intrinsic sphincter deficiency (ISD): Secondary | ICD-10-CM | POA: Diagnosis not present

## 2013-03-26 IMAGING — RF DG ESOPHAGUS
12 series · 12 of 12 positions shown · non-contrast
Comparison: CT angio chest 07/20/2012

CLINICAL DATA: The patient had esophageal dilatation before
07/24/2012.  She reports no pain or difficulty with swallowing
after the dilatation.  She has a known and dilated and tortuous
esophagus, as demonstrated on recent CT chest examinations.

ESOPHOGRAM/BARIUM SWALLOW
TECHNIQUE: Single contrast and single contrast examination
performed using thin barium
Fluoroscopy time:  3.9 minutes.

[Series 1: run · 1 of 1 slices shown (1 of 12)]
[im 1/1]
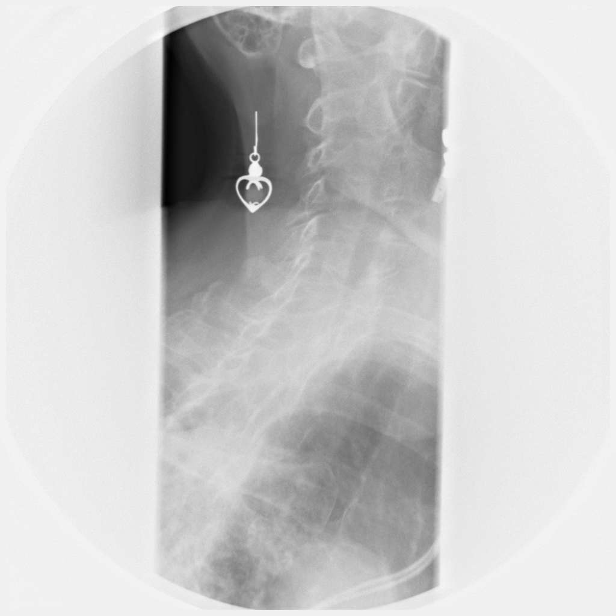

[Series 2: run · 1 of 1 slices shown (2 of 12)]
[im 1/1]
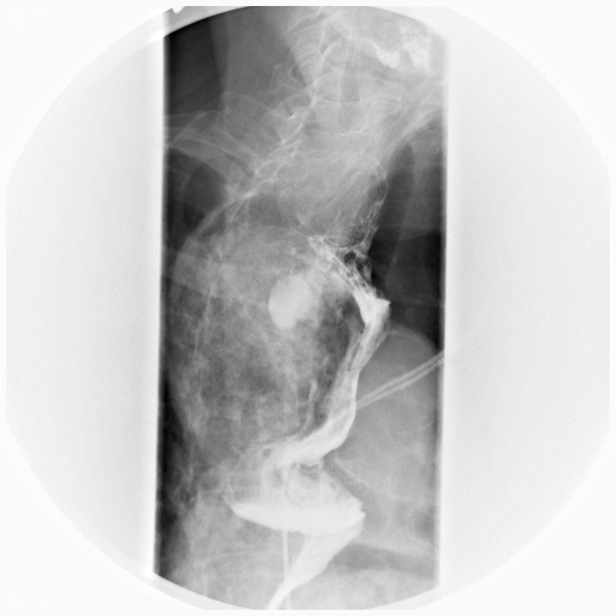

[Series 3: run · 1 of 1 slices shown (3 of 12)]
[im 1/1]
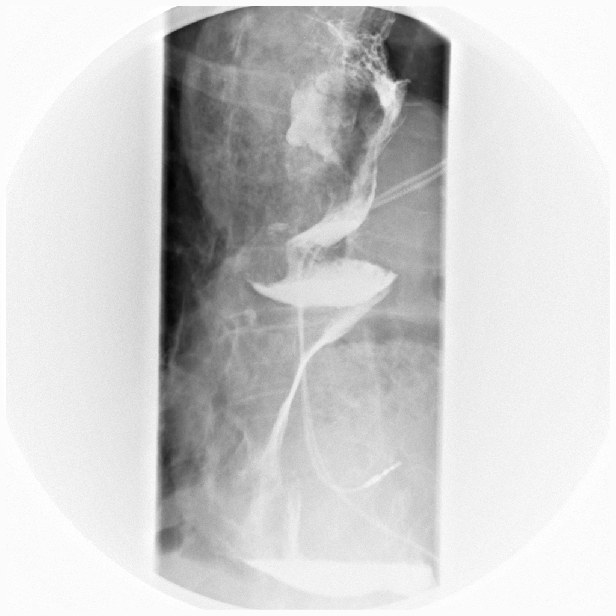

[Series 4: run · 1 of 1 slices shown (4 of 12)]
[im 1/1]
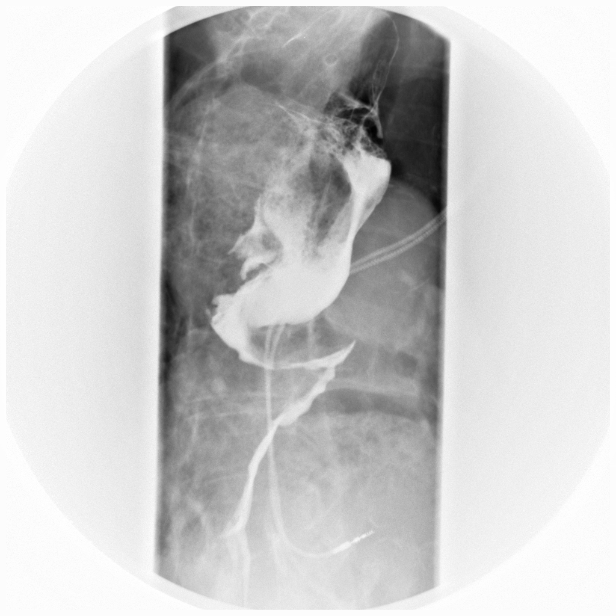

[Series 5: run · 1 of 1 slices shown (5 of 12)]
[im 1/1]
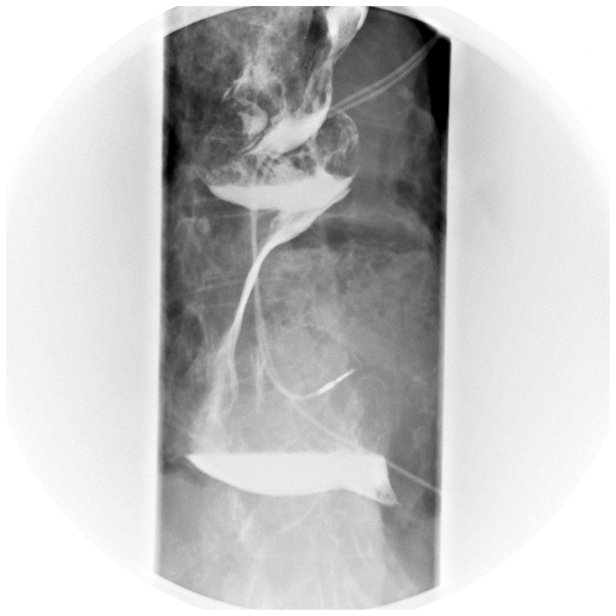

[Series 6: run · 1 of 1 slices shown (6 of 12)]
[im 1/1]
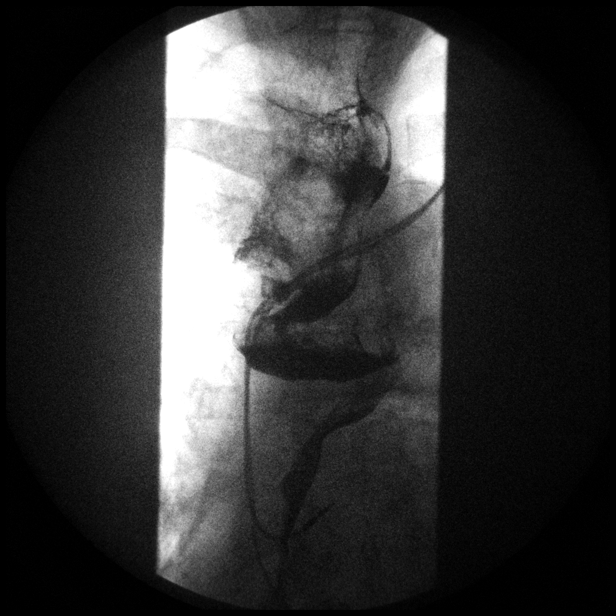

[Series 7: run · 1 of 1 slices shown (7 of 12)]
[im 1/1]
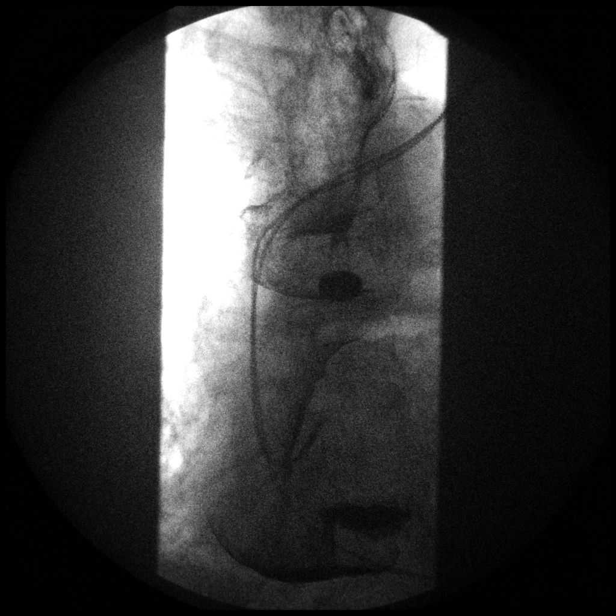

[Series 8: run · 1 of 1 slices shown (8 of 12)]
[im 1/1]
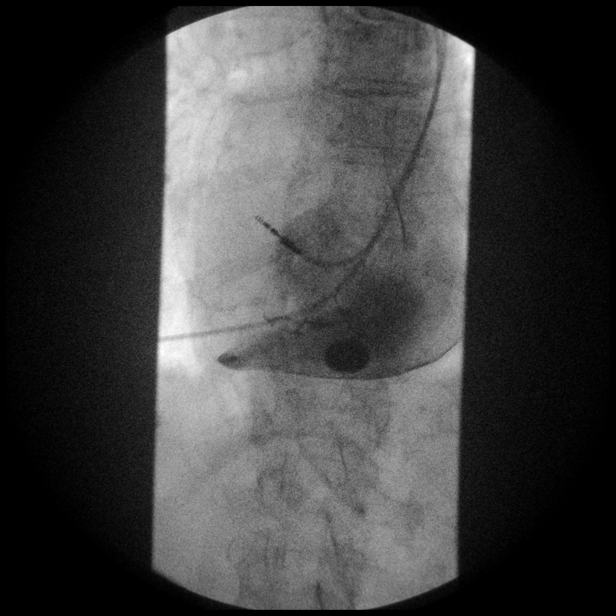

[Series 9: run · 1 of 1 slices shown (9 of 12)]
[im 1/1]
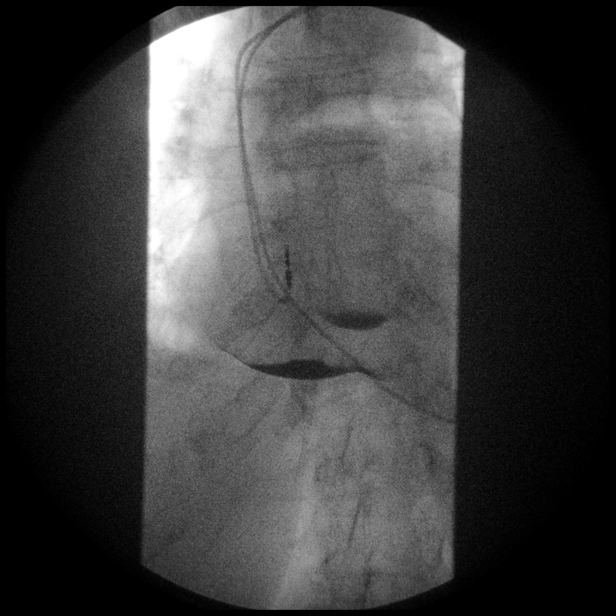

[Series 10: run · 1 of 1 slices shown (10 of 12)]
[im 1/1]
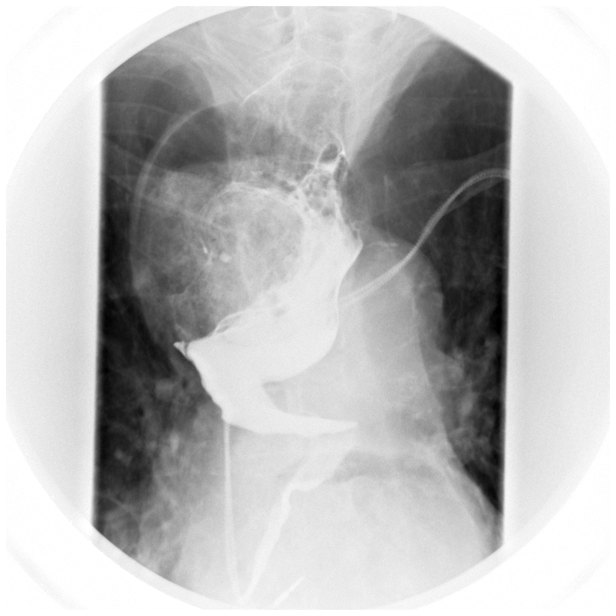

[Series 11: run · 1 of 1 slices shown (11 of 12)]
[im 1/1]
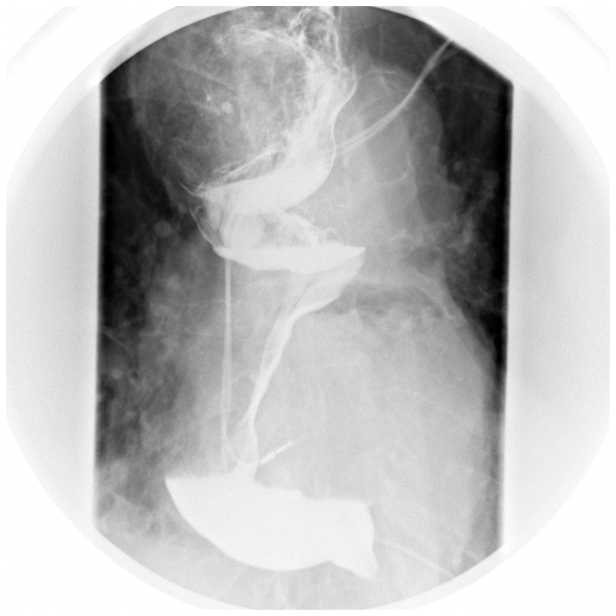

[Series 12: run · 1 of 1 slices shown (12 of 12)]
[im 1/1]
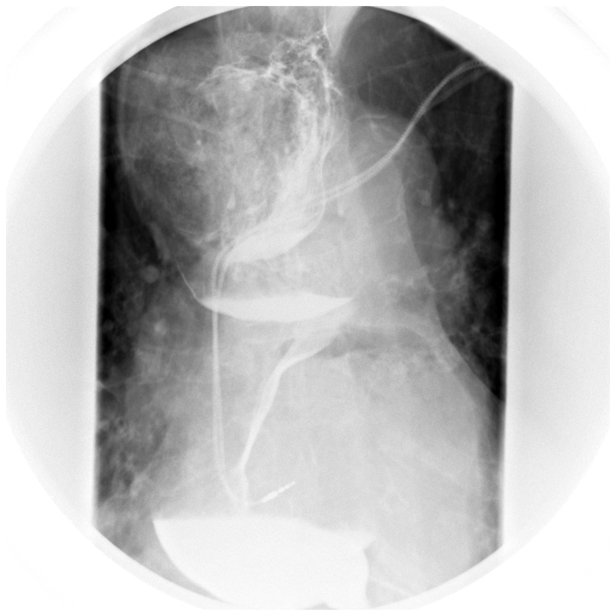

[12 of 12 positions shown; findings below may reference images not displayed]

FINDINGS: The patient has a markedly tortuous esophagus.  The
upper thoracic esophagus is dilated and has filling defects likely
reflecting food particles, as is demonstrated on recent CT chest.
Peristalsis is abnormal and there is some pooling/persistence of
contrast within the horizontally oriented aspects of the dilated
tortuous esophagus. The mid thoracic esophagus demonstrates a more
narrowed course, but a 13 mm barium tablet swallowed by the patient
was able to traverse this region.  The distal esophagus is also
dilated and tortuous. The 30 mm barium tablet persisted at the
level of the distal esophagus, near the gastroesophageal junction
for least 5 minutes and remained there at the end of the exam.

When the patient was placed prone on the table to obtain additional
imaging of the esophagus she had some vomiting and coughing.  Due
to this, the patient was moved back into the upright position.
Therefore, the patient was not asked to drink barium with on the
prone position.  No barium is seen beyond the confines of the
esophagus to suggest a leak.
IMPRESSION: 1.  Markedly dilated and tortuous(especially in the proximal and
distal aspects) esophagus.  There are filling defects within the
dilated portion of the upper thoracic esophagus suggesting stasis
of food particles.
2.  Abnormal esophageal motility, and stasis/pooling of barium
within the more horizontally oriented portions of the tortuous
esophagus.
3.  13 mm barium tablet persisted at the level of the distal
esophagus near the GE junction for greater than 5 minutes.  This
could be due to either a stricture or esophageal dysmotility.
4.  The patient did have some vomiting and coughing and she was
placed in the prone position on the examining table.  Question if
the patient could be prone to aspiration of the contents of the
upper thoracic esophagus, where there is evidence of stasis, given
the persistence of food particles in this region.

## 2013-03-27 ENCOUNTER — Encounter: Payer: Self-pay | Admitting: Internal Medicine

## 2013-03-27 ENCOUNTER — Non-Acute Institutional Stay: Payer: Medicare Other | Admitting: Internal Medicine

## 2013-03-27 VITALS — BP 118/60 | HR 72 | Ht 62.0 in | Wt 155.0 lb

## 2013-03-27 DIAGNOSIS — R32 Unspecified urinary incontinence: Secondary | ICD-10-CM

## 2013-03-27 DIAGNOSIS — R1319 Other dysphagia: Secondary | ICD-10-CM

## 2013-03-27 DIAGNOSIS — I10 Essential (primary) hypertension: Secondary | ICD-10-CM | POA: Diagnosis not present

## 2013-03-27 DIAGNOSIS — M353 Polymyalgia rheumatica: Secondary | ICD-10-CM

## 2013-03-27 DIAGNOSIS — R609 Edema, unspecified: Secondary | ICD-10-CM | POA: Diagnosis not present

## 2013-03-27 DIAGNOSIS — E042 Nontoxic multinodular goiter: Secondary | ICD-10-CM

## 2013-03-27 MED ORDER — MIRABEGRON ER 50 MG PO TB24: 50.0000 mg | ORAL_TABLET | Freq: Every day | ORAL | Status: DC

## 2013-03-27 NOTE — Progress Notes (Signed)
Subjective:    Patient ID: [name], female    DOB: January 30, 1929, 79 y.o.   MRN: [id number]  HPI Essential hypertension; Controlled ff medication.  Other dysphagia: stable. She wants to try something other than Prilosec.  Multinodular thyroid: unchanged. Large nodule right thyroid.  Edema: better. Now off HCTZ and furosemide.  Polymyalgia rheumatica: still trying to get prednisone dose lower. Has to go back to 10 mg on some days. Generally is able to do OK on 5 mg qd.  Saw urologist. They recommended either injeciton of silicon at urethra or sling procedure.    Current Outpatient Prescriptions on File Prior to Visit  Medication Sig Dispense Refill  . acetaminophen (TYLENOL) 325 MG tablet 650 mg. Take 650 mg by mouth 2 (two) times daily as needed for  pain      . B Complex Vitamins (VITAMIN-B COMPLEX) TABS 1 tablet. Take 1 tablet by mouth daily.      . Calcium Carbonate-Vit D-Min (CALCIUM 1200 PO) Take by mouth. Take one daily      . DIGESTIVE AIDS MIXTURE PO Take by mouth. Take one daily      . ibuprofen (ADVIL,MOTRIN) 200 MG tablet 400 mg. Take 400 mg by mouth 2 (two) times daily as needed for Pain.      . metoprolol succinate (TOPROL-XL) 25 MG 24 hr tablet One daily to control heart rhythm and BP  90 tablet  3  . omeprazole (PRILOSEC) 40 MG capsule Take 1 capsule (40 mg total) by mouth daily.  30 capsule  0  . Potassium (GNP POTASSIUM) 99 MG TABS 99 mg. Take 1 tablet by mouth daily.      . predniSONE (DELTASONE) 5 MG tablet Take 5 mg by mouth daily.      . Probiotic Product (SOLUBLE FIBER/PROBIOTICS PO) Take by mouth. Take one daily      . vitamin C (ASCORBIC ACID) 500 MG tablet Take 500 mg by mouth daily.      . Vitamin D, Ergocalciferol, (DRISDOL) 50000 UNITS CAPS Take 50,000 Units by mouth.      . Vitamins A & D (VITAMIN A & D PO) Take 1 capsule by mouth daily.       No current facility-administered medications on file prior to visit.    Review of Systems   Constitutional: Negative for fever, activity change, appetite change and fatigue.  Eyes: Negative.   Respiratory:       Aspiration pneumonia December 2013 complicated by subsequent empyema.  Cardiovascular: Positive for leg swelling. Negative for chest pain and palpitations.  Gastrointestinal:       Known achalasia and megaesophagus. Some dysphagia.   Endocrine: Negative.   Genitourinary:       Chronic urinary leakage. Wears protective pads for at least 10 years. She has seen urologist in the past. Sling surgery was proposed, but she did not want to go through this.  Musculoskeletal: Positive for myalgias. Negative for back pain and gait problem.       Diffuse muscular pains, worse in the shoulders.  Skin: Negative.   Allergic/Immunologic: Negative.   Neurological: Negative.   Hematological:       History of anemia.  Psychiatric/Behavioral: Negative.        Objective:BP 118/60  Pulse 72  Ht 5\' 2"  (1.575 m)  Wt 155 lb (70.308 kg)  BMI 28.34 kg/m2    Physical Exam  Constitutional: She is oriented to person, place, and time. She appears well-developed and well-nourished. No distress.  HENT:  Head: Normocephalic and atraumatic.  Right Ear: External ear normal.  Left Ear: External ear normal.  Nose: Nose normal.  Mouth/Throat: Oropharynx is clear and moist.  Eyes:  Corrective lenses worn.  Neck: Thyromegaly present.  Multinodular goiter with nodules palpable in all 4 lobes. Cyst in the right upper lobe of the thyroid is particularly large.  Cardiovascular: Normal rate, regular rhythm and intact distal pulses.  Exam reveals friction rub. Exam reveals no gallop.   No murmur heard. Pulmonary/Chest: Effort normal and breath sounds normal. No respiratory distress. She has no wheezes. She has no rales. She exhibits no tenderness.  Abdominal: Soft. Bowel sounds are normal. She exhibits no distension and no mass. There is no tenderness.  Musculoskeletal: Normal range of motion. She  exhibits no edema and no tenderness.  Left leg is shorter than the right.  Neurological: She is alert and oriented to person, place, and time. She has normal reflexes. No cranial nerve deficit. Coordination normal.  Skin: No rash noted. No erythema.  Psychiatric: She has a normal mood and affect. Her behavior is normal. Judgment and thought content normal.          Assessment & Plan:  Essential hypertension: controlled  Other dysphagia: unchangede  Multinodular thyroid: unchanged  Edema: improved  Polymyalgia rheumatica: stable. Encouraged her to continue to try to stay on the 5mg  tablet of prednisone every day.  Unspecified urinary incontinence: on Myrbetriq from Urologist

## 2013-03-27 NOTE — Patient Instructions (Signed)
Continue current medications. 

## 2013-04-12 DIAGNOSIS — Z23 Encounter for immunization: Secondary | ICD-10-CM | POA: Diagnosis not present

## 2013-05-01 ENCOUNTER — Other Ambulatory Visit: Payer: Self-pay | Admitting: Urology

## 2013-05-09 DIAGNOSIS — Z950 Presence of cardiac pacemaker: Secondary | ICD-10-CM | POA: Diagnosis not present

## 2013-05-17 ENCOUNTER — Encounter (HOSPITAL_BASED_OUTPATIENT_CLINIC_OR_DEPARTMENT_OTHER): Payer: Self-pay | Admitting: *Deleted

## 2013-05-17 DIAGNOSIS — N3642 Intrinsic sphincter deficiency (ISD): Secondary | ICD-10-CM | POA: Diagnosis not present

## 2013-05-17 DIAGNOSIS — Z01818 Encounter for other preprocedural examination: Secondary | ICD-10-CM | POA: Diagnosis not present

## 2013-05-18 ENCOUNTER — Encounter (HOSPITAL_BASED_OUTPATIENT_CLINIC_OR_DEPARTMENT_OTHER): Payer: Self-pay | Admitting: *Deleted

## 2013-05-18 NOTE — Progress Notes (Addendum)
NPO AFTER MN. ARRIVE AT 0830. NEEDS ISTAT. CURRENT EKG AND CXR IN EPIC AND CHART. WILL TAKE PRILOSEC, MYRBETRIQ ,  AND PREDNISONE AM DOS W/ SIP OF WATER.

## 2013-05-23 NOTE — H&P (Signed)
ason For Visit  Seen today for a pre-op H&P.   Active Problems Problems   1. Intrinsic sphincter deficiency (599.82)   Assessed By: Jetta Lout (Urology); Last Assessed: 17 May 2013  2. Pre-op evaluation (V72.84)   Assessed By: Jetta Lout (Urology); Last Assessed: 17 May 2013  History of Present Illness        [name] of Dr. Belva Crome seen today for a pre-op H&P. Is scheduled for [facility] 05/24/13.  GU HX:  August 2014 urodynamics.  She was given a trial of Myrbetriq for her urge symptoms and had improvement in the urge component but she would still leak.  She had one dry day but then had a big leak one day.  The study showed some delay sensation with post void instability and incomplete emptying with PVR's from 100-200cc.   She did have a low VLPP and CLPP consistent with ISD.   Interval HX:  Today states she is doing well. No CP, cough, SOB, or f/c.   Past Medical History Problems   1. History of Arthritis (V13.4)  2. History of Disorder of heart rhythm (427.9)  3. History of esophageal reflux (V12.79)  4. History of polymyalgia rheumatica (V13.59)  5. History of sleep apnea (V13.89)  Surgical History Problems   1. History of Abdominal Surgery  2. History of Cataract Surgery  3. History of Hip Surgery  4. History of Lung Surgery  5. History of Pacemaker Placement  6. History of Varicose Vein Ligation  Current Meds  1. Acetaminophen 500 MG Oral Tablet;  Therapy: (Recorded:16Jul2014) to Recorded  2. Calcium TABS;  Therapy: (Recorded:16Jul2014) to Recorded  3. Fish Oil CAPS;  Therapy: (Recorded:16Jul2014) to Recorded  4. Myrbetriq 50 MG Oral Tablet Extended Release 24 Hour; Take 1 tablet daily;  Therapy: 18Aug2014 to (Evaluate:13Aug2015)  Requested for: 18Aug2014; Last  Rx:18Aug2014 Ordered  5. Omeprazole 40 MG Oral Capsule Delayed Release;  Therapy: (Recorded:16Jul2014) to Recorded  6. PredniSONE TABS;  Therapy: (Recorded:16Jul2014) to  Recorded  7. Toprol XL 25 MG Oral Tablet Extended Release 24 Hour;  Therapy: 13May2014 to Recorded  8. Vitamin B Complex Oral Tablet;  Therapy: (Recorded:16Jul2014) to Recorded  9. Vitamin C TABS;  Therapy: (Recorded:16Jul2014) to Recorded  10. Vitamin D TABS;   Therapy: (Recorded:16Jul2014) to Recorded  Allergies Medication   1. Actonel TABS  Family History Problems   1. Family history of Congestive Heart Failure : Mother  2. Family history of Death In The Family Father  3. Family history of Death In The Family Mother  4. Family history of Family Health Status Number Of Children   4 sons 5 daughters  5. Family history of Prostate Cancer (Z61.09) : Brother  Social History Problems   1. Alcohol Use   3-4 per week  2. Caffeine Use   1-2 per day  3. Marital History - Currently Married  4. Never A Smoker  5. Retired From Work  Review of [employer], constitutional, skin, eye, otolaryngeal, hematologic/lymphatic, cardiovascular, pulmonary, endocrine, musculoskeletal, gastrointestinal, neurological and psychiatric system(s) were reviewed and pertinent findings if present are noted.    Physical Exam Constitutional: Well nourished and well developed . No acute distress. The Oneill appears well hydrated.  ENT:. The ears and nose are normal in appearance.  Neck: The appearance of the neck is normal.  Pulmonary: No respiratory distress, normal respiratory rhythm and effort and clear bilateral breath sounds.  Cardiovascular: Heart rate and rhythm are normal . The arterial pulses are  normal. No peripheral edema.  Abdomen: The abdomen is mildly obese. The abdomen is soft and nontender. No suprapubic tenderness.  Skin: Normal skin turgor and normal skin color and pigmentation.  Neuro/Psych:. Mood and affect are appropriate.    Results/Data  The following clinical lab reports were reviewed:  Per-op Cath UA C&S. Selected Results  URINE CULTURE 06Nov2014 12:33PM Jetta Lout  SOURCE : CATHED SPECIMEN TYPE: CATH URINE   Test Name Result Flag Reference  CULTURE, URINE Culture, Urine    ===== COLONY COUNT: =====  NO GROWTH   FINAL REPORT: NO GROWTH   UA With REFLEX 06Nov2014 09:19AM Jetta Lout  SPECIMEN TYPE: CATH URINE   Test Name Result Flag Reference  COLOR YELLOW  YELLOW  APPEARANCE CLEAR  CLEAR  SPECIFIC GRAVITY 1.010  1.005-1.030  pH 6.0  5.0-8.0  GLUCOSE NEG mg/dL  NEG  BILIRUBIN NEG  NEG  KETONE NEG mg/dL  NEG  BLOOD NEG  NEG  PROTEIN NEG mg/dL  NEG  UROBILINOGEN 0.2 mg/dL  8.1-1.9  NITRITE NEG  NEG  LEUKOCYTE ESTERASE NEG  NEG   Assessment Assessed   1. Intrinsic sphincter deficiency (599.82)  2. Pre-op evaluation (V72.84)  End of Encounter Meds  Medication Name Instruction  Acetaminophen 500 MG Oral Tablet   Calcium TABS   Fish Oil CAPS   Myrbetriq 50 MG Oral Tablet Extended Release 24 Hour Take 1 tablet daily  Omeprazole 40 MG Oral Capsule Delayed Release   PredniSONE TABS   Toprol XL 25 MG Oral Tablet Extended Release 24 Hour (Metoprolol Succinate ER)   Vitamin B Complex Oral Tablet   Vitamin C TABS   Vitamin D TABS    Plan  Intrinsic sphincter deficiency   1. Cath for Specimen [facility]); Status:Complete;   Done: 06Nov2014   Will culture urine but no ABX unless culture positive Is cleared for upcoming surgical intervention 05/24/13   URINE CULTURE; Status:Hold For - Specimen/Data Collection; Requested for:06Nov2014;  Perform:[facility]; Due:08Nov2014; Marked Important;Ordered; Today;   JYN:WGNFAOZHY sphincter deficiency; Ordered QM:VHQION, Diane;  UA With REFLEX; Status:Resulted - Requires Verification;   Done: 01Jan0001 12:00AM Due:08Nov2014; Marked Important;Ordered; Today;   GEX:BMWUXL Maintenance; Ordered KG:MWNUUV, Diane;   Future Appointments  Date/Time Provider Specialty Site  05/24/2013 10:00 AM Bjorn Pippin, M.D. Urology [facility]  06/05/2013 11:15 AM Corky Crafts Urology [facility]   07/02/2013 10:45 AM Bjorn Pippin, M.D. Urology [facility]

## 2013-05-24 ENCOUNTER — Encounter (HOSPITAL_BASED_OUTPATIENT_CLINIC_OR_DEPARTMENT_OTHER): Payer: Self-pay | Admitting: *Deleted

## 2013-05-24 ENCOUNTER — Ambulatory Visit (HOSPITAL_BASED_OUTPATIENT_CLINIC_OR_DEPARTMENT_OTHER): Payer: Medicare Other | Admitting: Anesthesiology

## 2013-05-24 ENCOUNTER — Encounter (HOSPITAL_BASED_OUTPATIENT_CLINIC_OR_DEPARTMENT_OTHER)
Admission: RE | Disposition: A | Discharge status: Home / Self Care | Payer: Self-pay | Source: Ambulatory Visit | Attending: Urology

## 2013-05-24 ENCOUNTER — Ambulatory Visit (HOSPITAL_BASED_OUTPATIENT_CLINIC_OR_DEPARTMENT_OTHER)
Admission: RE | Admit: 2013-05-24 | Discharge: 2013-05-24 | Disposition: A | Discharge status: Home / Self Care | Payer: Medicare Other | Source: Ambulatory Visit | Attending: Urology | Admitting: Urology

## 2013-05-24 ENCOUNTER — Encounter (HOSPITAL_BASED_OUTPATIENT_CLINIC_OR_DEPARTMENT_OTHER): Payer: Medicare Other | Admitting: Anesthesiology

## 2013-05-24 DIAGNOSIS — M353 Polymyalgia rheumatica: Secondary | ICD-10-CM | POA: Diagnosis not present

## 2013-05-24 DIAGNOSIS — K219 Gastro-esophageal reflux disease without esophagitis: Secondary | ICD-10-CM | POA: Diagnosis not present

## 2013-05-24 DIAGNOSIS — Z950 Presence of cardiac pacemaker: Secondary | ICD-10-CM | POA: Insufficient documentation

## 2013-05-24 DIAGNOSIS — N3642 Intrinsic sphincter deficiency (ISD): Secondary | ICD-10-CM | POA: Diagnosis present

## 2013-05-24 DIAGNOSIS — N393 Stress incontinence (female) (male): Secondary | ICD-10-CM | POA: Diagnosis not present

## 2013-05-24 DIAGNOSIS — G4730 Sleep apnea, unspecified: Secondary | ICD-10-CM | POA: Insufficient documentation

## 2013-05-24 DIAGNOSIS — N3946 Mixed incontinence: Secondary | ICD-10-CM | POA: Diagnosis present

## 2013-05-24 HISTORY — DX: Atrioventricular block, complete: I44.2

## 2013-05-24 HISTORY — DX: Personal history of other diseases of the respiratory system: Z87.09

## 2013-05-24 HISTORY — DX: Mixed incontinence: N39.46

## 2013-05-24 HISTORY — DX: Achalasia of cardia: K22.0

## 2013-05-24 HISTORY — DX: Intrinsic sphincter deficiency (ISD): N36.42

## 2013-05-24 HISTORY — PX: BOTOX INJECTION: SHX5754

## 2013-05-24 HISTORY — DX: Obstructive sleep apnea (adult) (pediatric): G47.33

## 2013-05-24 HISTORY — DX: Reserved for concepts with insufficient information to code with codable children: IMO0002

## 2013-05-24 HISTORY — DX: Other specified postprocedural states: Z98.890

## 2013-05-24 HISTORY — DX: Personal history of other diseases of the digestive system: Z87.19

## 2013-05-24 HISTORY — DX: Unspecified right bundle-branch block: I45.10

## 2013-05-24 HISTORY — DX: Dependence on other enabling machines and devices: Z99.89

## 2013-05-24 LAB — POCT I-STAT, CHEM 8
Calcium, Ion: 1.35 mmol/L — ABNORMAL HIGH (ref 1.13–1.30)
Chloride: 105 mEq/L (ref 96–112)
HCT: 33 % — ABNORMAL LOW (ref 36.0–46.0)
Hemoglobin: 11.2 g/dL — ABNORMAL LOW (ref 12.0–15.0)
TCO2: 27 mmol/L (ref 0–100)

## 2013-05-24 SURGERY — BOTOX INJECTION
Anesthesia: General | Site: Bladder | Wound class: Clean Contaminated

## 2013-05-24 MED ORDER — CIPROFLOXACIN IN D5W 400 MG/200ML IV SOLN
400.0000 mg | INTRAVENOUS | Status: AC
  Administered 2013-05-24: 400 mg via INTRAVENOUS
  Filled 2013-05-24: qty 200

## 2013-05-24 MED ORDER — PROMETHAZINE HCL 25 MG/ML IJ SOLN
6.2500 mg | INTRAMUSCULAR | Status: DC | PRN
  Filled 2013-05-24: qty 1

## 2013-05-24 MED ORDER — CIPROFLOXACIN HCL 250 MG PO TABS: 250.0000 mg | ORAL_TABLET | Freq: Two times a day (BID) | ORAL | Status: AC

## 2013-05-24 MED ORDER — SODIUM CHLORIDE 0.9 % IJ SOLN
3.0000 mL | Freq: Two times a day (BID) | INTRAMUSCULAR | Status: DC
  Filled 2013-05-24: qty 3

## 2013-05-24 MED ORDER — ONDANSETRON HCL 4 MG/2ML IJ SOLN
4.0000 mg | Freq: Four times a day (QID) | INTRAMUSCULAR | Status: DC | PRN
  Filled 2013-05-24: qty 2

## 2013-05-24 MED ORDER — SUCCINYLCHOLINE CHLORIDE 20 MG/ML IJ SOLN
INTRAMUSCULAR | Status: DC | PRN
  Administered 2013-05-24: 100 mg via INTRAVENOUS

## 2013-05-24 MED ORDER — STERILE WATER FOR IRRIGATION IR SOLN
Status: DC | PRN
  Administered 2013-05-24: 3000 mL

## 2013-05-24 MED ORDER — OXYCODONE HCL 5 MG PO TABS
5.0000 mg | ORAL_TABLET | ORAL | Status: DC | PRN
  Filled 2013-05-24: qty 2

## 2013-05-24 MED ORDER — LIDOCAINE HCL (CARDIAC) 20 MG/ML IV SOLN
INTRAVENOUS | Status: DC | PRN
  Administered 2013-05-24: 50 mg via INTRAVENOUS

## 2013-05-24 MED ORDER — FENTANYL CITRATE 0.05 MG/ML IJ SOLN
25.0000 ug | INTRAMUSCULAR | Status: DC | PRN
  Filled 2013-05-24: qty 1

## 2013-05-24 MED ORDER — FENTANYL CITRATE 0.05 MG/ML IJ SOLN
INTRAMUSCULAR | Status: DC | PRN
  Administered 2013-05-24: 25 ug via INTRAVENOUS

## 2013-05-24 MED ORDER — SODIUM CHLORIDE 0.9 % IJ SOLN
3.0000 mL | INTRAMUSCULAR | Status: DC | PRN
  Filled 2013-05-24: qty 3

## 2013-05-24 MED ORDER — ACETAMINOPHEN 325 MG PO TABS
650.0000 mg | ORAL_TABLET | ORAL | Status: DC | PRN
  Filled 2013-05-24: qty 2

## 2013-05-24 MED ORDER — MEPERIDINE HCL 25 MG/ML IJ SOLN
6.2500 mg | INTRAMUSCULAR | Status: DC | PRN
  Filled 2013-05-24: qty 1

## 2013-05-24 MED ORDER — ONDANSETRON HCL 4 MG/2ML IJ SOLN
INTRAMUSCULAR | Status: DC | PRN
  Administered 2013-05-24: 4 mg via INTRAVENOUS

## 2013-05-24 MED ORDER — SODIUM CHLORIDE 0.9 % IV SOLN
250.0000 mL | INTRAVENOUS | Status: DC | PRN
  Filled 2013-05-24: qty 250

## 2013-05-24 MED ORDER — CONTIGEN TREATMENT SYRINGE MISC
Status: DC | PRN
  Administered 2013-05-24: 5 mL

## 2013-05-24 MED ORDER — TRAMADOL HCL 50 MG PO TABS: 50.0000 mg | ORAL_TABLET | Freq: Four times a day (QID) | ORAL | Status: DC | PRN

## 2013-05-24 MED ORDER — ACETAMINOPHEN 650 MG RE SUPP
650.0000 mg | RECTAL | Status: DC | PRN
  Filled 2013-05-24: qty 1

## 2013-05-24 MED ORDER — PROPOFOL 10 MG/ML IV BOLUS
INTRAVENOUS | Status: DC | PRN
  Administered 2013-05-24: 50 mg via INTRAVENOUS
  Administered 2013-05-24: 150 mg via INTRAVENOUS

## 2013-05-24 MED ORDER — LACTATED RINGERS IV SOLN
INTRAVENOUS | Status: DC
  Administered 2013-05-24: 09:00:00 via INTRAVENOUS
  Filled 2013-05-24: qty 1000

## 2013-05-24 SURGICAL SUPPLY — 20 items
BAG DRAIN URO-CYSTO SKYTR STRL (DRAIN) ×2 IMPLANT
CANISTER SUCT LVC 12 LTR MEDI- (MISCELLANEOUS) ×2 IMPLANT
CATH ROBINSON RED A/P 12FR (CATHETERS) ×2 IMPLANT
CLOTH BEACON ORANGE TIMEOUT ST (SAFETY) ×2 IMPLANT
DRAPE CAMERA CLOSED 9X96 (DRAPES) ×2 IMPLANT
ELECT REM PT RETURN 9FT ADLT (ELECTROSURGICAL)
ELECTRODE REM PT RTRN 9FT ADLT (ELECTROSURGICAL) IMPLANT
GLOVE BIO SURGEON STRL SZ 6.5 (GLOVE) ×2 IMPLANT
GLOVE INDICATOR 7.0 STRL GRN (GLOVE) ×2 IMPLANT
GLOVE SURG SS PI 8.0 STRL IVOR (GLOVE) ×2 IMPLANT
GOWN PREVENTION PLUS LG XLONG (DISPOSABLE) ×2 IMPLANT
GOWN STRL REIN XL XLG (GOWN DISPOSABLE) ×2 IMPLANT
INJECTION MACROPLASIQ 2.5ML UN (Female Continence) ×2 IMPLANT
MACROPLASTIQUE 2.5ML UNIT (Female Continence) ×4 IMPLANT
NDL SAFETY ECLIPSE 18X1.5 (NEEDLE) IMPLANT
NEEDLE HYPO 18GX1.5 SHARP (NEEDLE)
NEEDLE RIGID UROPLASTY (NEEDLE) ×2 IMPLANT
PACK CYSTOSCOPY (CUSTOM PROCEDURE TRAY) ×2 IMPLANT
SYR BULB IRRIGATION 50ML (SYRINGE) IMPLANT
WATER STERILE IRR 3000ML UROMA (IV SOLUTION) ×2 IMPLANT

## 2013-05-24 NOTE — Anesthesia Procedure Notes (Signed)
Procedure Name: Intubation Date/Time: 05/24/2013 9:26 AM Performed by: Maris Berger T Pre-anesthesia Checklist: Patient identified, Emergency Drugs available, Suction available and Patient being monitored Patient Re-evaluated:Patient Re-evaluated prior to inductionOxygen Delivery Method: Circle System Utilized Preoxygenation: Pre-oxygenation with 100% oxygen Intubation Type: IV induction Ventilation: Mask ventilation without difficulty Laryngoscope Size: Mac and 3 Grade View: Grade I Tube type: Oral Number of attempts: 1 Airway Equipment and Method: stylet and oral airway Placement Confirmation: ETT inserted through vocal cords under direct vision,  positive ETCO2 and breath sounds checked- equal and bilateral Secured at: 22 cm Tube secured with: Tape Dental Injury: Teeth and Oropharynx as per pre-operative assessment  Comments: 4 LMA placed easily, unable to ventilate.LMA removed with pill/food in.  suctionedwithout return of anything, second LMA placed easily but questionable ventilation removed suction well  O2Sat 100 %.  Removed suctioned and intubated.  Cord appear clean.  No change to sats.

## 2013-05-24 NOTE — Anesthesia Postprocedure Evaluation (Signed)
  Anesthesia Post-op Note  Patient: [name]  Procedure(s) Performed: Procedure(s) (LRB): MACROPLASTIQUE IMPLANT (N/A)  Patient Location: PACU  Anesthesia Type: General  Level of Consciousness: awake and alert   Airway and Oxygen Therapy: Patient Spontanous Breathing  Post-op Pain: mild  Post-op Assessment: Post-op Vital signs reviewed, Patient's Cardiovascular Status Stable, Respiratory Function Stable, Patent Airway and No signs of Nausea or vomiting  Last Vitals:  Filed Vitals:   05/24/13 1154  BP: 165/78  Pulse: 72  Temp: 36.1 C  Resp: 18    Post-op Vital Signs: stable   Complications: No apparent anesthesia complications

## 2013-05-24 NOTE — Anesthesia Preprocedure Evaluation (Signed)
Anesthesia Evaluation  Patient identified by MRN, date of birth, ID band Patient awake    Reviewed: Allergy & Precautions, H&P , NPO status , Patient's Chart, lab work & pertinent test results  Airway Mallampati: II TM Distance: >3 FB Neck ROM: Full    Dental no notable dental hx.    Pulmonary sleep apnea and Continuous Positive Airway Pressure Ventilation ,  breath sounds clear to auscultation  Pulmonary exam normal       Cardiovascular negative cardio ROS  + pacemaker Rhythm:Regular Rate:Normal     Neuro/Psych negative neurological ROS  negative psych ROS   GI/Hepatic Neg liver ROS, GERD-  Medicated and Controlled,  Endo/Other  negative endocrine ROS  Renal/GU negative Renal ROS  negative genitourinary   Musculoskeletal negative musculoskeletal ROS (+)   Abdominal   Peds negative pediatric ROS (+)  Hematology negative hematology ROS (+)   Anesthesia Other Findings   Reproductive/Obstetrics negative OB ROS                           Anesthesia Physical Anesthesia Plan  ASA: II  Anesthesia Plan: General   Post-op Pain Management:    Induction: Intravenous  Airway Management Planned: LMA  Additional Equipment:   Intra-op Plan:   Post-operative Plan: Extubation in OR  Informed Consent: I have reviewed the patients History and Physical, chart, labs and discussed the procedure including the risks, benefits and alternatives for the proposed anesthesia with the patient or authorized representative who has indicated his/her understanding and acceptance.   Dental advisory given  Plan Discussed with: CRNA  Anesthesia Plan Comments:         Anesthesia Quick Evaluation

## 2013-05-24 NOTE — Transfer of Care (Signed)
Immediate Anesthesia Transfer of Care Note  Patient: [name]  Procedure(s) Performed: Procedure(s): MACROPLASTIQUE IMPLANT (N/A)  Patient Location: PACU  Anesthesia Type:General  Level of Consciousness: awake, alert  and oriented  Airway & Oxygen Therapy: Patient Spontanous Breathing and Patient connected to nasal cannula oxygen  Post-op Assessment: Report given to PACU RN  Post vital signs: Reviewed and stable  Complications: No apparent anesthesia complications

## 2013-05-24 NOTE — Interval H&P Note (Signed)
History and Physical Interval Note:  05/24/2013 9:02 AM  [name]  has presented today for surgery, with the diagnosis of Stress Urinary Incontinence with Intrinsic Sphincter Deficiency  The various methods of treatment have been discussed with the patient and family. After consideration of risks, benefits and other options for treatment, the patient has consented to  Procedure(s): MACROPLASTIQUE IMPLANT (N/A) as a surgical intervention .  The patient's history has been reviewed, patient examined, no change in status, stable for surgery.  I have reviewed the patient's chart and labs.  Questions were answered to the patient's satisfaction.     Kaushik Maul J

## 2013-05-24 NOTE — Brief Op Note (Signed)
05/24/2013  9:46 AM  PATIENT:  [name]  79 y.o. female  PRE-OPERATIVE DIAGNOSIS:  Stress Urinary Incontinence with Intrinsic Sphincter Deficiency  POST-OPERATIVE DIAGNOSIS:  Stress Urinary Incontinence with Intrinsic Sphincter Deficiency  PROCEDURE:  Procedure(s): MACROPLASTIQUE IMPLANT (N/A)  SURGEON:  Surgeon(s) and Role:    * Bjorn Pippin, MD - Primary  PHYSICIAN ASSISTANT:   ASSISTANTS: none   ANESTHESIA:   general  EBL:  Total I/O In: 200 [I.V.:200] Out: -   BLOOD ADMINISTERED:none  DRAINS: none   LOCAL MEDICATIONS USED:  NONE  SPECIMEN:  No Specimen  DISPOSITION OF SPECIMEN:  N/A  COUNTS:  YES  TOURNIQUET:  * No tourniquets in log *  DICTATION: .Other Dictation: Dictation Number [id number]  PLAN OF CARE: Discharge to home after PACU  PATIENT DISPOSITION:  PACU - hemodynamically stable.   Delay start of Pharmacological VTE agent (>24hrs) due to surgical blood loss or risk of bleeding: not applicable

## 2013-05-25 NOTE — Op Note (Signed)
NAMEJOELIE, SCHOU            ACCOUNT NO.:  [id number]  MEDICAL RECORD NO.:  [id number]  LOCATION:                                 FACILITY:  PHYSICIAN:  Excell Seltzer. Annabell Howells, M.D.    DATE OF BIRTH:  1928/10/21  DATE OF PROCEDURE: DATE OF DISCHARGE:  05/24/2013                              OPERATIVE REPORT   PROCEDURE:  Cystoscopy with Macroplastique implant.  PREOPERATIVE DIAGNOSIS:  Stress incontinence with intrinsic sphincter deficiency.  POSTOPERATIVE DIAGNOSIS:  Stress incontinence with intrinsic sphincter deficiency.  SURGEON:  Excell Seltzer. Annabell Howells, M.D.  ANESTHESIA:  General.  SPECIMEN:  None.  DRAINS:  None.  COMPLICATIONS:  None.  INDICATIONS:  Ms. Blumenstein is an 79 year old white female with mixed incontinence with intrinsic sphincter deficiency, who has failed medical therapy and has elected Macroplastique for treatment of her leakage.  FINDINGS AND PROCEDURE:  She had a preoperative urine that was negative. She was given Cipro and taken to the operating room where general anesthetic was induced.  She was fitted with PAS hose and placed in lithotomy position.  Her perineum and genitalia were prepped with Betadine solution.  She was draped in the usual sterile fashion.  The injection scope was prepared with a 30-degree lens and camera.  The Macroplastique needle was advanced through the scope and primed with the first syringe of Macroplastique.  The scope was then lubricated and advanced.  Examination revealed a widely patent urethra consistent with ISD.  Inspection of the bladder revealed a smooth wall without tumor, stones, or inflammation and the ureteral orifices were unremarkable.  After initial cystoscopy, the Macroplastique implant was performed. Initially the needle was inserted at 9 o'clock at a 45-degrees angle to the first # mark and then advanced parallel to the urethra to the second # mark.  Approximately 1.25 mL of Macroplastique was instilled with  some bulging of the mucosa that was expected.  The needle was then left in place for 30 seconds, then removed without leakage of material.  This procedure was then repeated at 3 o'clock on the urethra and the remaining contents of the syringe were injected with excellent bulging of the mucosa with coaptation at the midline.  At this point, a fresh syringe was placed on the needle and a third stick was made at 6 o'clock and 2.5 mL of Macroplastique was injected in this posterior plane.  The needle was held for 30 seconds and removed as before.  After completion of the injections, inspection demonstrated excellent coaptation of the urethra and no evidence of leakage of material.  I did not advance the scope into the bladder to avoid altering the implant.  At this point, a 12-French red rubber catheter was used to drain the bladder.  This was removed.  The patient was taken down from lithotomy position.  Her anesthetic was reversed.  She was moved to recovery room in stable condition.  There were no complications.     Excell Seltzer. Annabell Howells, M.D.     JJW/MEDQ  D:  05/24/2013  T:  05/25/2013  Job:  [id number]

## 2013-05-28 ENCOUNTER — Encounter (HOSPITAL_BASED_OUTPATIENT_CLINIC_OR_DEPARTMENT_OTHER): Payer: Self-pay | Admitting: Urology

## 2013-06-05 DIAGNOSIS — R339 Retention of urine, unspecified: Secondary | ICD-10-CM | POA: Diagnosis not present

## 2013-06-05 DIAGNOSIS — N3642 Intrinsic sphincter deficiency (ISD): Secondary | ICD-10-CM | POA: Diagnosis not present

## 2013-07-09 DIAGNOSIS — I10 Essential (primary) hypertension: Secondary | ICD-10-CM | POA: Diagnosis not present

## 2013-07-09 DIAGNOSIS — E041 Nontoxic single thyroid nodule: Secondary | ICD-10-CM | POA: Diagnosis not present

## 2013-07-09 LAB — BASIC METABOLIC PANEL
BUN: 24 mg/dL — AB (ref 4–21)
Creatinine: 0.6 mg/dL (ref 0.5–1.1)
Glucose: 102 mg/dL
Potassium: 4.2 mmol/L (ref 3.4–5.3)
SODIUM: 142 mmol/L (ref 137–147)

## 2013-07-09 LAB — TSH: TSH: 1.1 u[IU]/mL (ref 0.41–5.90)

## 2013-07-17 ENCOUNTER — Non-Acute Institutional Stay: Payer: Medicare Other | Admitting: Internal Medicine

## 2013-07-17 ENCOUNTER — Encounter: Payer: Self-pay | Admitting: Internal Medicine

## 2013-07-17 VITALS — BP 132/62 | HR 80 | Wt 155.0 lb

## 2013-07-17 DIAGNOSIS — E042 Nontoxic multinodular goiter: Secondary | ICD-10-CM | POA: Diagnosis not present

## 2013-07-17 DIAGNOSIS — D649 Anemia, unspecified: Secondary | ICD-10-CM | POA: Insufficient documentation

## 2013-07-17 DIAGNOSIS — R609 Edema, unspecified: Secondary | ICD-10-CM

## 2013-07-17 DIAGNOSIS — IMO0002 Reserved for concepts with insufficient information to code with codable children: Secondary | ICD-10-CM | POA: Diagnosis not present

## 2013-07-17 DIAGNOSIS — I10 Essential (primary) hypertension: Secondary | ICD-10-CM

## 2013-07-17 DIAGNOSIS — Z950 Presence of cardiac pacemaker: Secondary | ICD-10-CM | POA: Diagnosis not present

## 2013-07-17 DIAGNOSIS — N3946 Mixed incontinence: Secondary | ICD-10-CM

## 2013-07-17 DIAGNOSIS — R1319 Other dysphagia: Secondary | ICD-10-CM

## 2013-07-17 DIAGNOSIS — M353 Polymyalgia rheumatica: Secondary | ICD-10-CM

## 2013-07-17 DIAGNOSIS — R7309 Other abnormal glucose: Secondary | ICD-10-CM

## 2013-07-17 DIAGNOSIS — R739 Hyperglycemia, unspecified: Secondary | ICD-10-CM

## 2013-07-17 MED ORDER — PREDNISONE 1 MG PO TABS: ORAL_TABLET | ORAL | Status: DC

## 2013-07-17 NOTE — Progress Notes (Signed)
Patient ID: [name], female   DOB: [birth year], 80 y.o.   MRN: [id number]    Location:  [facility]   Place of Service: Clinic (12)    Allergies  Allergen Reactions  . Actonel [Risedronate Sodium] Other (See Comments)    Joint aches; rechallenged --caused joint aches  . Ivp Dye [Iodinated Diagnostic Agents] Nausea And Vomiting    Chief Complaint  Patient presents with  . Medical Managment of Chronic Issues    blood pressure, anemia, thyroid    HPI:  Essential hypertension: controlled  Multinodular thyroid: unchanged. Normal TSH.  Encounter for long-term (current) use of steroids: used to treat PMR. Wants to begin tapering to a lower dose.  Other dysphagia:still has occasional feeling of food hanging in substernal area.  Edema; unchanged  Polymyalgia rheumatica: denies myalgias. remains on prednisone.  Urge and stress incontinence: improved since Macroplastique implant  Anemia: went to donate blood and was declined due to "anemia: with hgb 12.1. She denies any recent ly observed blood loss or melena. Hx of gastric AVM.  Cardiac pacemaker in situ: functioning normally    Active Ambulatory Problems    Diagnosis Date Noted  . Polymyalgia rheumatica 06/19/2012  . Mediastinal mass 06/19/2012  . Megaesophagus 06/20/2012  . Multinodular thyroid 06/26/2012  . Other dysphagia 06/27/2012  . Gastric AVM 08/07/2012  . Unspecified urinary incontinence 11/21/2012  . Disorder of rotator cuff syndrome of shoulder and allied disorder 04/01/2011  . Sleep apnea 11/21/2012  . Disorder of bone and cartilage 11/21/2012  . Obesity 11/21/2012  . Encounter for long-term (current) use of steroids 01/27/2012  . Edema 11/21/2012  . Abnormal glucose tolerance test 11/21/2012  . Essential hypertension 11/21/2012  . Paroxysmal ventricular tachycardia 01/18/2012  . Personal history of other diseases of circulatory system 11/21/2012  . Hammer toe, acquired 04/17/2012  .  Cardiac pacemaker in situ 01/18/2012  . Reflux esophagitis 01/28/2013  . Achalasia of esophagus 10/10/2012  . Sinoatrial node dysfunction 01/05/2013  . Intrinsic sphincter deficiency 05/24/2013  . Urge and stress incontinence 05/24/2013   Resolved Ambulatory Problems    Diagnosis Date Noted  . Pacemaker 06/19/2012  . Pleural effusion 06/19/2012  . Aspiration into respiratory tract 06/20/2012  . Empyema 06/22/2012  . Aspiration pneumonia 06/27/2012  . PNA (pneumonia)   . Empyema, right   . Sprain and strain of wrist 05/10/2011  . Urinary incontinence 11/21/2012  . Fibromyalgia 01/28/2013  . Nontoxic uninodular goiter 01/28/2013   Past Medical History  Diagnosis Date  . GERD (gastroesophageal reflux disease)   . Arthritis   . Hypertension   . OSA on CPAP   . Intrinsic urethral sphincter deficiency   . Multiple thyroid nodules   . RBBB   . Chronic steroid use   . History of aspiration pneumonitis   . Mixed stress and urge urinary incontinence   . Complete heart block   . Normal cardiac stress test   . H/O echocardiogram   . S/P dilatation of esophageal stricture       Medications: Patient's Medications  New Prescriptions   No medications on file  Previous Medications   ACETAMINOPHEN (TYLENOL) 325 MG TABLET    650 mg. Take 650 mg by mouth 2 (two) times daily as needed for  pain   B COMPLEX VITAMINS (VITAMIN-B COMPLEX) TABS    1 tablet. Take 1 tablet by mouth daily.   CALCIUM CARBONATE-VIT D-MIN (CALCIUM 1200 PO)    Take by mouth. Take one daily  CHOLECALCIFEROL (VITAMIN D3) 5000 UNITS CAPS    Take 1 capsule by mouth daily.   DIGESTIVE AIDS MIXTURE PO    Take 1 capsule by mouth daily. Take one daily   IBUPROFEN (ADVIL,MOTRIN) 200 MG TABLET    400 mg. Take 400 mg by mouth 2 (two) times daily as needed for Pain.   METOPROLOL SUCCINATE (TOPROL-XL) 25 MG 24 HR TABLET    Take 25 mg by mouth every evening. One daily to control heart rhythm and BP   OMEGA-3 FATTY ACIDS (FISH  OIL) 1200 MG CPDR    Take 1 capsule by mouth daily.   OMEPRAZOLE (PRILOSEC) 40 MG CAPSULE    Take 40 mg by mouth every morning.   PREDNISONE (DELTASONE) 5 MG TABLET    Take 5 mg by mouth daily with breakfast.    PROBIOTIC PRODUCT (SOLUBLE FIBER/PROBIOTICS PO)    Take by mouth. Take one daily   RANITIDINE (ZANTAC) 150 MG TABLET    Take 150 mg by mouth. Take one tablet daily for stomac   VITAMIN A 8000 UNIT CAPSULE    Take 8,000 Units by mouth daily.   VITAMIN C (ASCORBIC ACID) 500 MG TABLET    Take 500 mg by mouth daily.  Modified Medications   No medications on file  Discontinued Medications   MIRABEGRON ER (MYRBETRIQ) 50 MG TB24 TABLET    Take 1 tablet (50 mg total) by mouth daily. One daily to help bladder control   TRAMADOL (ULTRAM) 50 MG TABLET    Take 1 tablet (50 mg total) by mouth every 6 (six) hours as needed for moderate pain.     Review of Systems  Constitutional: Negative for fever, activity change, appetite change and fatigue.       Mildly overweight.  Eyes: Negative.   Respiratory:       Aspiration pneumonia December 0000000 complicated by subsequent empyema. Appears fully recovered as of 07/17/13.  Cardiovascular: Positive for leg swelling. Negative for chest pain and palpitations.  Gastrointestinal:       Known achalasia and megaesophagus. Some dysphagia.   Endocrine: Negative.   Genitourinary:       Chronic urinary leakage. Wears protective pads for at least 10 years. She has seen urologist in the past. Sling surgery was proposed, but she did not want to go through this.  Musculoskeletal: Positive for myalgias. Negative for back pain and gait problem.       Diffuse muscular pains, worse in the shoulders.  Skin: Negative.   Allergic/Immunologic: Negative.   Neurological: Negative.   Hematological:       History of anemia.  Psychiatric/Behavioral: Negative.     Filed Vitals:   07/17/13 0912  BP: 132/62  Pulse: 80  Weight: 155 lb (70.308 kg)   Physical Exam    Constitutional: She is oriented to person, place, and time. She appears well-developed and well-nourished. No distress.  HENT:  Head: Normocephalic and atraumatic.  Right Ear: External ear normal.  Left Ear: External ear normal.  Nose: Nose normal.  Mouth/Throat: Oropharynx is clear and moist.  Eyes:  Corrective lenses worn.  Neck: Thyromegaly present.  Multinodular goiter with nodules palpable in all 4 lobes. Cyst in the right upper lobe of the thyroid is particularly large.  Cardiovascular: Normal rate, regular rhythm and intact distal pulses.  Exam reveals friction rub. Exam reveals no gallop.   No murmur heard. Pulmonary/Chest: Effort normal and breath sounds normal. No respiratory distress. She has no wheezes. She has no  rales. She exhibits no tenderness.  Abdominal: Soft. Bowel sounds are normal. She exhibits no distension and no mass. There is no tenderness.  Musculoskeletal: Normal range of motion. She exhibits no edema and no tenderness.  Left leg is shorter than the right.  Neurological: She is alert and oriented to person, place, and time. She has normal reflexes. No cranial nerve deficit. Coordination normal.  Skin: No rash noted. No erythema.  Psychiatric: She has a normal mood and affect. Her behavior is normal. Judgment and thought content normal.     Labs reviewed: Abstract on 07/13/2013  Component Date Value Range Status  . Glucose 07/09/2013 102   Final  . BUN 07/09/2013 24* 4 - 21 mg/dL Final  . Creatinine 07/09/2013 0.6  0.5 - 1.1 mg/dL Final  . Potassium 07/09/2013 4.2  3.4 - 5.3 mmol/L Final  . Sodium 07/09/2013 142  137 - 147 mmol/L Final  . TSH 07/09/2013 1.10  0.41 - 5.90 uIU/mL Final  Admission on 05/24/2013, Discharged on 05/24/2013  Component Date Value Range Status  . Sodium 05/24/2013 142  135 - 145 mEq/L Final  . Potassium 05/24/2013 3.6  3.5 - 5.1 mEq/L Final  . Chloride 05/24/2013 105  96 - 112 mEq/L Final  . BUN 05/24/2013 23  6 - 23 mg/dL  Final  . Creatinine, Ser 05/24/2013 0.70  0.50 - 1.10 mg/dL Final  . Glucose, Bld 05/24/2013 108* 70 - 99 mg/dL Final  . Calcium, Ion 05/24/2013 1.35* 1.13 - 1.30 mmol/L Final  . TCO2 05/24/2013 27  0 - 100 mmol/L Final  . Hemoglobin 05/24/2013 11.2* 12.0 - 15.0 g/dL Final  . HCT 05/24/2013 33.0* 36.0 - 46.0 % Final      Assessment/Plan  Essential hypertension: controlled  Multinodular thyroid: unchanged  Encounter for long-term (current) use of steroids; begin taper to 4 mg qd  Other dysphagia: continue to observe  Edema: stable  Polymyalgia rheumatica - Plan: predniSONE (DELTASONE) 1 MG tablet--take 4 tabs daily  Urge and stress incontinence: improved  Anemia: recheck next visit  Cardiac pacemaker in situ: normal functioning  Hyperglycemia: mild. Advised to avoid concentrated sweets

## 2013-08-21 DIAGNOSIS — Z950 Presence of cardiac pacemaker: Secondary | ICD-10-CM | POA: Diagnosis not present

## 2013-10-10 DIAGNOSIS — R339 Retention of urine, unspecified: Secondary | ICD-10-CM | POA: Diagnosis not present

## 2013-10-10 DIAGNOSIS — N3946 Mixed incontinence: Secondary | ICD-10-CM | POA: Diagnosis not present

## 2013-10-10 DIAGNOSIS — N3642 Intrinsic sphincter deficiency (ISD): Secondary | ICD-10-CM | POA: Diagnosis not present

## 2013-11-05 DIAGNOSIS — D649 Anemia, unspecified: Secondary | ICD-10-CM | POA: Diagnosis not present

## 2013-11-05 DIAGNOSIS — I10 Essential (primary) hypertension: Secondary | ICD-10-CM | POA: Diagnosis not present

## 2013-11-05 DIAGNOSIS — R7309 Other abnormal glucose: Secondary | ICD-10-CM | POA: Diagnosis not present

## 2013-11-05 LAB — BASIC METABOLIC PANEL
BUN: 20 mg/dL (ref 4–21)
CREATININE: 0.7 mg/dL (ref 0.5–1.1)
Glucose: 90 mg/dL
Potassium: 4.6 mmol/L (ref 3.4–5.3)
SODIUM: 142 mmol/L (ref 137–147)

## 2013-11-05 LAB — CBC AND DIFFERENTIAL
HEMATOCRIT: 36 % (ref 36–46)
Hemoglobin: 12.1 g/dL (ref 12.0–16.0)
PLATELETS: 151 10*3/uL (ref 150–399)
WBC: 6.9 10^3/mL

## 2013-11-05 LAB — HEPATIC FUNCTION PANEL
ALK PHOS: 79 U/L (ref 25–125)
ALT: 9 U/L (ref 7–35)
AST: 13 U/L (ref 13–35)
Bilirubin, Total: 6.3 mg/dL

## 2013-11-05 LAB — HEMOGLOBIN A1C: HEMOGLOBIN A1C: 5.5 % (ref 4.0–6.0)

## 2013-11-13 ENCOUNTER — Encounter: Payer: Self-pay | Admitting: Internal Medicine

## 2013-11-13 ENCOUNTER — Non-Acute Institutional Stay: Payer: Medicare Other | Admitting: Internal Medicine

## 2013-11-13 VITALS — BP 126/66 | HR 68 | Wt 152.0 lb

## 2013-11-13 DIAGNOSIS — Z8679 Personal history of other diseases of the circulatory system: Secondary | ICD-10-CM | POA: Insufficient documentation

## 2013-11-13 DIAGNOSIS — R32 Unspecified urinary incontinence: Secondary | ICD-10-CM | POA: Insufficient documentation

## 2013-11-13 DIAGNOSIS — R1319 Other dysphagia: Secondary | ICD-10-CM | POA: Diagnosis not present

## 2013-11-13 DIAGNOSIS — Z950 Presence of cardiac pacemaker: Secondary | ICD-10-CM | POA: Diagnosis not present

## 2013-11-13 DIAGNOSIS — I10 Essential (primary) hypertension: Secondary | ICD-10-CM | POA: Diagnosis not present

## 2013-11-13 DIAGNOSIS — R600 Localized edema: Secondary | ICD-10-CM | POA: Insufficient documentation

## 2013-11-13 DIAGNOSIS — I472 Ventricular tachycardia: Secondary | ICD-10-CM | POA: Diagnosis not present

## 2013-11-13 DIAGNOSIS — I4729 Other ventricular tachycardia: Secondary | ICD-10-CM | POA: Diagnosis not present

## 2013-11-13 DIAGNOSIS — E042 Nontoxic multinodular goiter: Secondary | ICD-10-CM

## 2013-11-13 DIAGNOSIS — L501 Idiopathic urticaria: Secondary | ICD-10-CM | POA: Insufficient documentation

## 2013-11-13 DIAGNOSIS — IMO0002 Reserved for concepts with insufficient information to code with codable children: Secondary | ICD-10-CM

## 2013-11-13 DIAGNOSIS — M353 Polymyalgia rheumatica: Secondary | ICD-10-CM

## 2013-11-13 DIAGNOSIS — E669 Obesity, unspecified: Secondary | ICD-10-CM

## 2013-11-13 DIAGNOSIS — R609 Edema, unspecified: Secondary | ICD-10-CM

## 2013-11-13 DIAGNOSIS — D649 Anemia, unspecified: Secondary | ICD-10-CM

## 2013-11-13 DIAGNOSIS — R7303 Prediabetes: Secondary | ICD-10-CM

## 2013-11-13 DIAGNOSIS — R7309 Other abnormal glucose: Secondary | ICD-10-CM

## 2013-11-13 DIAGNOSIS — M8580 Other specified disorders of bone density and structure, unspecified site: Secondary | ICD-10-CM | POA: Insufficient documentation

## 2013-11-13 DIAGNOSIS — M2040 Other hammer toe(s) (acquired), unspecified foot: Secondary | ICD-10-CM

## 2013-11-13 DIAGNOSIS — M810 Age-related osteoporosis without current pathological fracture: Secondary | ICD-10-CM | POA: Insufficient documentation

## 2013-11-13 NOTE — Progress Notes (Signed)
Patient ID: [name], female   DOB: 11-17-1928, 80 y.o.   MRN: [id number]    Location:  [facility]  Place of Service: CLINIC    Allergies  Allergen Reactions  . Actonel [Risedronate Sodium] Other (See Comments)    Joint aches; rechallenged --caused joint aches  . Ivp Dye [Iodinated Diagnostic Agents] Nausea And Vomiting    Chief Complaint  Patient presents with  . Medical Management of Chronic Issues    blood pressure, hyperglycemia, anemia    HPI:  Dysphagia. Food is hanging up more frequently. Hx of megaesophagus, achalasia, and reflux. Had Botox in the past by Dr. Deatra Ina.  Essential hypertension:  controlled  Multinodular thyroid: unchanged  Borderline diabetes: normal glucose on last lab  Obesity: not currently trying to lose weight  Edema; improved  Polymyalgia rheumatica: has been able to reduce prednisone to 2 mg qd.  Encounter for long-term (current) use of steroids: may affect glucose  Anemia: resolved  Hammer toe: left foot 2nd and 4th toes. Had surgery on the 3rd toe in past.    Medications: Patient's Medications  New Prescriptions   No medications on file  Previous Medications   ACETAMINOPHEN (TYLENOL) 325 MG TABLET    Take 650 mg by mouth. Take 2 tablets twice daily as needed for pain   ASCORBIC ACID (VITAMIN C) 500 MG TABLET    Take 500 mg by mouth. Take one tablet daily   B COMPLEX VITAMINS CAPSULE    Take 1 capsule by mouth daily.   CALCIUM CARBONATE-VIT D-MIN (CALCIUM 1200 PO)    Take by mouth. Take one daily   CHOLECALCIFEROL (VITAMIN D-3) 5000 UNITS TABS    Take by mouth. Take one capsule daily   DIGESTIVE AIDS MIXTURE PO    Take by mouth. Take one capsule daily   IBUPROFEN (ADVIL,MOTRIN) 200 MG TABLET    Take by mouth. Take 2 two times daily as needed for pain   METOPROLOL TARTRATE (LOPRESSOR) 25 MG TABLET    Take 25 mg by mouth. Take one every evening to control heart rhythm and blood pressure   OMEGA 3 1200 MG CAPS    Take by mouth.  Take one capsule daily   PREDNISONE (DELTASONE) 1 MG TABLET    Take 1 mg by mouth. Take 2 tablets daily to treat muscular pains   PROBIOTIC PRODUCT (PROBIOTIC DAILY PO)    Take by mouth. Take one daily   RANITIDINE (ZANTAC) 150 MG TABLET    Take 150 mg by mouth 2 (two) times daily.   VITAMIN A 8000 UNIT CAPSULE    Take 8,000 Units by mouth daily.  Modified Medications   No medications on file  Discontinued Medications   ACETAMINOPHEN (TYLENOL) 325 MG TABLET    650 mg. Take 650 mg by mouth 2 (two) times daily as needed for  pain   B COMPLEX VITAMINS (VITAMIN-B COMPLEX) TABS    1 tablet. Take 1 tablet by mouth daily.   CALCIUM CARBONATE-VIT D-MIN (CALCIUM 1200 PO)    Take by mouth. Take one daily   CHOLECALCIFEROL (VITAMIN D3) 5000 UNITS CAPS    Take 1 capsule by mouth daily.   DIGESTIVE AIDS MIXTURE PO    Take 1 capsule by mouth daily. Take one daily   IBUPROFEN (ADVIL,MOTRIN) 200 MG TABLET    400 mg. Take 400 mg by mouth 2 (two) times daily as needed for Pain.   METOPROLOL SUCCINATE (TOPROL-XL) 25 MG 24 HR TABLET    Take  25 mg by mouth every evening. One daily to control heart rhythm and BP   OMEGA-3 FATTY ACIDS (FISH OIL) 1200 MG CPDR    Take 1 capsule by mouth daily.   OMEPRAZOLE (PRILOSEC) 40 MG CAPSULE    Take 40 mg by mouth every morning.   PREDNISONE (DELTASONE) 1 MG TABLET    Take 4 tablets daily to treat muscular pains   PROBIOTIC PRODUCT (SOLUBLE FIBER/PROBIOTICS PO)    Take by mouth. Take one daily   RANITIDINE (ZANTAC) 150 MG TABLET    Take 150 mg by mouth. Take one tablet daily for stomac   VITAMIN A 8000 UNIT CAPSULE    Take 8,000 Units by mouth daily.   VITAMIN C (ASCORBIC ACID) 500 MG TABLET    Take 500 mg by mouth daily.     Review of Systems  Constitutional: Negative for fever, activity change, appetite change and fatigue.       Mildly overweight.  Eyes: Negative.   Respiratory:       Aspiration pneumonia December 9563 complicated by subsequent empyema. Appears fully  recovered as of 07/17/13.  Cardiovascular: Negative for chest pain, palpitations and leg swelling.  Gastrointestinal:       Known achalasia and megaesophagus. Some dysphagia.   Endocrine: Negative.   Genitourinary:       Chronic urinary leakage. Wears protective pads for at least 10 years. She has seen urologist in the past. Sling surgery was proposed, but she did not want to go through this.  Musculoskeletal: Positive for myalgias. Negative for back pain and gait problem.       Diffuse muscular pains, worse in the shoulders.  Skin: Negative.   Allergic/Immunologic: Negative.   Neurological: Negative.   Hematological:       History of anemia.  Psychiatric/Behavioral: Negative.     Filed Vitals:   11/13/13 0951  BP: 126/66  Pulse: 68  Weight: 152 lb (68.947 kg)   Physical Exam  Constitutional: She is oriented to person, place, and time. She appears well-developed and well-nourished. No distress.  HENT:  Head: Normocephalic and atraumatic.  Right Ear: External ear normal.  Left Ear: External ear normal.  Nose: Nose normal.  Mouth/Throat: Oropharynx is clear and moist.  Eyes:  Corrective lenses worn.  Neck: Thyromegaly present.  Multinodular goiter with nodules palpable in all 4 lobes. Cyst in the right upper lobe of the thyroid is particularly large.  Cardiovascular: Normal rate, regular rhythm and intact distal pulses.  Exam reveals friction rub. Exam reveals no gallop.   No murmur heard. Pulmonary/Chest: Effort normal and breath sounds normal. No respiratory distress. She has no wheezes. She has no rales. She exhibits no tenderness.  Abdominal: Soft. Bowel sounds are normal. She exhibits no distension and no mass. There is no tenderness.  Musculoskeletal: Normal range of motion. She exhibits no edema and no tenderness.  Left leg is shorter than the right. Left 2nd and 4th hammer toes. Left 3rd toe had corrective surgery for hammer toe.  Neurological: She is alert and oriented  to person, place, and time. She has normal reflexes. No cranial nerve deficit. Coordination normal.  Skin: No rash noted. No erythema.  Psychiatric: She has a normal mood and affect. Her behavior is normal. Judgment and thought content normal.     Labs reviewed: Nursing Home on 11/13/2013  Component Date Value Ref Range Status  . Hemoglobin 11/05/2013 12.1  12.0 - 16.0 g/dL Final  . HCT 11/05/2013 36  36 -  46 % Final  . Platelets 11/05/2013 151  150 - 399 K/L Final  . WBC 11/05/2013 6.9   Final  . Glucose 11/05/2013 90   Final  . BUN 11/05/2013 20  4 - 21 mg/dL Final  . Creatinine 11/05/2013 0.7  0.5 - 1.1 mg/dL Final  . Potassium 11/05/2013 4.6  3.4 - 5.3 mmol/L Final  . Sodium 11/05/2013 142  137 - 147 mmol/L Final  . Alkaline Phosphatase 11/05/2013 79  25 - 125 U/L Final  . ALT 11/05/2013 9  7 - 35 U/L Final  . AST 11/05/2013 13  13 - 35 U/L Final  . Bilirubin, Total 11/05/2013 6.3   Final  . Hemoglobin A1C 11/05/2013 5.5  4.0 - 6.0 % Final      Assessment/Plan  1. Other dysphagia Recommend appt with Dr. Deatra Ina. She will call.  2. Essential hypertension controlled  3. Borderline diabetes Controlled by diet  4. Multinodular thyroid unchanged  5. Obesity Continue to try to lose weight   6. Edema improved  7. Polymyalgia rheumatica Stable. No pains on lower dose of prednisone. Advised her to continue to try to reduce prednisone, but to reduce by 1/2 mg increments now.  8. Encounter for long-term (current) use of steroids See above  9. Anemia resolved  10. Hammer toe She will let me know if she desires appt with podiatrist or surgeon for her toes

## 2013-11-21 DIAGNOSIS — Z950 Presence of cardiac pacemaker: Secondary | ICD-10-CM | POA: Diagnosis not present

## 2014-01-09 ENCOUNTER — Other Ambulatory Visit: Payer: Self-pay | Admitting: Internal Medicine

## 2014-01-18 ENCOUNTER — Telehealth: Payer: Self-pay | Admitting: Gastroenterology

## 2014-01-18 NOTE — Telephone Encounter (Signed)
Pt states she is having problems swallowing. Scheduled pt an OV with Amy Esterwood PA 01/31/14@9am . Pt aware.

## 2014-01-18 NOTE — Telephone Encounter (Signed)
Left message for pt to call back  °

## 2014-01-31 ENCOUNTER — Ambulatory Visit (INDEPENDENT_AMBULATORY_CARE_PROVIDER_SITE_OTHER): Payer: Medicare Other | Admitting: Physician Assistant

## 2014-01-31 ENCOUNTER — Other Ambulatory Visit: Payer: Self-pay | Admitting: *Deleted

## 2014-01-31 ENCOUNTER — Encounter: Payer: Self-pay | Admitting: Physician Assistant

## 2014-01-31 VITALS — BP 120/70 | HR 76 | Ht 62.0 in | Wt 150.4 lb

## 2014-01-31 DIAGNOSIS — R1310 Dysphagia, unspecified: Secondary | ICD-10-CM

## 2014-01-31 DIAGNOSIS — K219 Gastro-esophageal reflux disease without esophagitis: Secondary | ICD-10-CM

## 2014-01-31 DIAGNOSIS — K224 Dyskinesia of esophagus: Secondary | ICD-10-CM | POA: Diagnosis not present

## 2014-01-31 NOTE — Patient Instructions (Addendum)
Continue Zantac 150 mg, 1 in the morning and take 2 tabs (300 mg) in the evening. You have been scheduled for an endoscopy. Please follow written instructions given to you at your visit today. If you use inhalers (even only as needed), please bring them with you on the day of your procedure. Your physician has requested that you go to www.startemmi.com and enter the access code given to you at your visit today. This web site gives a general overview about your procedure. However, you should still follow specific instructions given to you by our office regarding your preparation for the procedure.

## 2014-01-31 NOTE — Progress Notes (Signed)
Subjective:    Patient ID: [name], female    DOB: [birth year], 80 y.o.   MRN: [id number]  HPI  Elaysha is a very nice 80 year old white female known to Dr. Deatra Ina. She has history of polymyalgia rheumatica, sleep apnea, hypertension, history of paroxysmal V tach, and a she status post pacemaker. She also has a diagnosis of a megaesophagus and probable achalasia. She had had a manometry done at [facility] in December of 2013 and this showed no effective peristalsis she did not have elevated LES pressures and it was felt that she had a motility disorder in the setting of connective tissue disease or severe GERD. She has undergone prior EGDs with balloon dilations and Botox injections with Dr. Deatra Ina. It has been felt in the past that she has responded to both. Her last procedure was in January of 2014 she was noted to have a matted esophagus 1 gastric AVM and she underwent a Botox injections x4. Just prior to that she had had EGD with balloon dilation and was dilated to 15 mm. Patient comes back in today saying that she's had recurrence of her dysphagia symptoms over the past several months gradually worsening. She says that she feels her food sits in her chest for prolonged periods of time and this causes discomfort. She doesn't specifically have difficulty with liquids alone but says she often has to drink a lot of liquids to push the food down and sometimes she will regurgitate liquids. She also has ongoing heartburn and indigestion. She has been having some intermittent episodes of regurgitation as mentioned above. No complaints of abdominal pain. She has been taking Zantac twice daily. Appetite is fine weight is down a couple of pounds and she started with recurrent regurgitation.  Patient feels that she does best with balloon dilation does not feel she necessarily had good response to Botox.    Review of Systems  Constitutional: Negative.   HENT: Positive for trouble swallowing.    Eyes: Negative.   Respiratory: Negative.   Gastrointestinal: Positive for vomiting.  Endocrine: Negative.   Genitourinary: Negative.   Musculoskeletal: Negative.   Allergic/Immunologic: Negative.   Neurological: Negative.   Hematological: Negative.   Psychiatric/Behavioral: Negative.    Outpatient Prescriptions Prior to Visit  Medication Sig Dispense Refill  . acetaminophen (TYLENOL) 325 MG tablet Take 650 mg by mouth. Take 2 tablets twice daily as needed for pain      . ascorbic acid (VITAMIN C) 500 MG tablet Take 500 mg by mouth. Take one tablet daily      . b complex vitamins capsule Take 1 capsule by mouth daily.      . Calcium Carbonate-Vit D-Min (CALCIUM 1200 PO) Take by mouth. Take one daily      . Cholecalciferol (VITAMIN D-3) 5000 UNITS TABS Take by mouth. Take one capsule daily      . DIGESTIVE AIDS MIXTURE PO Take by mouth. Take one capsule daily      . ibuprofen (ADVIL,MOTRIN) 200 MG tablet Take by mouth. Take 2 two times daily as needed for pain      . metoprolol succinate (TOPROL-XL) 25 MG 24 hr tablet TAKE 1 TABLET DAILY TO CONTROL BLOOD PRESSURE AND HEART RHYTHM  90 tablet  1  . Omega 3 1200 MG CAPS Take by mouth. Take one capsule daily      . predniSONE (DELTASONE) 1 MG tablet Take 1.5 mg by mouth. Take 1.5 tablets daily to treat muscular pains      .  Probiotic Product (PROBIOTIC DAILY PO) Take by mouth. Take one daily      . ranitidine (ZANTAC) 150 MG tablet Take 150 mg by mouth 2 (two) times daily.      . vitamin A 8000 UNIT capsule Take 8,000 Units by mouth daily.      . metoprolol tartrate (LOPRESSOR) 25 MG tablet Take 25 mg by mouth. Take one every evening to control heart rhythm and blood pressure       No facility-administered medications prior to visit.   Allergies  Allergen Reactions  . Actonel [Risedronate Sodium] Other (See Comments)    Joint aches; rechallenged --caused joint aches  . Ivp Dye [Iodinated Diagnostic Agents] Nausea And Vomiting    Patient Active Problem List   Diagnosis Date Noted  . Borderline diabetes 11/13/2013  . Osteopenia 11/13/2013  . Urinary incontinence 11/13/2013  . Idiopathic angio-edema-urticaria 11/13/2013  . Anemia 07/17/2013  . Hyperglycemia 07/17/2013  . Intrinsic sphincter deficiency 05/24/2013  . Urge and stress incontinence 05/24/2013  . Reflux esophagitis 01/28/2013  . Sinoatrial node dysfunction 01/05/2013  . Sick sinus syndrome 01/05/2013  . Unspecified urinary incontinence 11/21/2012  . Sleep apnea 11/21/2012  . Disorder of bone and cartilage 11/21/2012  . Obesity 11/21/2012  . Edema 11/21/2012  . Abnormal glucose tolerance test 11/21/2012  . Essential hypertension 11/21/2012  . Personal history of other diseases of circulatory system 11/21/2012  . Achalasia of esophagus 10/10/2012  . Gastric AVM 08/07/2012  . Other dysphagia 06/27/2012  . Multinodular thyroid 06/26/2012  . Megaesophagus 06/20/2012  . Polymyalgia rheumatica 06/19/2012  . Mediastinal mass 06/19/2012  . Hammer toe 04/17/2012  . Disease of skin and subcutaneous tissue 04/17/2012  . Encounter for long-term (current) use of steroids 01/27/2012  . Paroxysmal ventricular tachycardia 01/18/2012  . Cardiac pacemaker in situ 01/18/2012  . Non-sustained ventricular tachycardia 01/18/2012  . Sprain of wrist 05/10/2011  . Disorder of rotator cuff syndrome of shoulder and allied disorder 04/01/2011  . Bursitis, scapulohumeral 04/01/2011   History  Substance Use Topics  . Smoking status: Never Smoker   . Smokeless tobacco: Never Used  . Alcohol Use: 1.8 oz/week    3 Glasses of wine per week    family history includes Arthritis in her brother and father; Cancer in her brother; Diabetes in her son; Heart disease in her brother, brother, father, and mother; Hypertension in her brother. There is no history of Colon cancer.   Objective:   Physical Exam   well-developed elderly white female in no acute distress, pleasant  blood pressure 120/70 pulse 76 height 5 foot 2 weight 150. HEENT ;nontraumatic normocephalic EOMI PERRLA sclera anicteric, Supple ;no JVD, Cardiovascular; regular rate and rhythm with S1-S2 she does have a pacemaker, Pulmonary ;clear bilaterally she is kyphotic, Abdomen; soft nontender nondistended bowel sounds are active there is no palpable mass or hepatosplenomegaly bowel sounds are present, Rectal ;exam not done, Extremities; no clubbing cyanosis or edema skin warm and dry, Psych ;mood and affect appropriate        Assessment & Plan:  #55 80 year old female with significant motility disorder with prior manometry showing no effective peristalsis, possible achalasia. Patient has had both balloon dilations and Botox injections in the past last procedure about 18 months ago. She presents now with recurrence of worsening dysphagia over the past several months primarily to solids but also to liquids. #2 minimal weight loss secondary to above #3 GERD #4 hypertension #5 polymyalgia rheumatica #6 status post pacemaker history of paroxysmal V  Tach  Plan; continue Zantac 150 every morning and increase to 300 mg every afternoon Reflux precautions reviewed and he is to be in an upright position for 2 hours after meals and keep head of her bed elevated Will schedule for EGD with balloon dilation with Dr. Deatra Ina at [facility] period Procedure discussed in detail with the patient and she is agreeable to proceed. Depending on response to balloon dilation can decide whether another Botox session is indicated.

## 2014-02-01 ENCOUNTER — Telehealth: Payer: Self-pay | Admitting: Physician Assistant

## 2014-02-01 NOTE — Telephone Encounter (Signed)
Pt is coming back today to sign another hospital waiver of liability form.  She request just the second block be checked because she is not comfortable with the first block being checked.  We will make her a copy once signed for her files.

## 2014-02-04 NOTE — Progress Notes (Signed)
Reviewed and agree with management. Robert D. Kaplan, M.D., FACG  

## 2014-02-25 ENCOUNTER — Encounter (HOSPITAL_COMMUNITY): Payer: Self-pay | Admitting: *Deleted

## 2014-02-25 ENCOUNTER — Encounter (HOSPITAL_COMMUNITY)
Admission: RE | Disposition: A | Discharge status: Home / Self Care | Payer: Self-pay | Source: Ambulatory Visit | Attending: Gastroenterology

## 2014-02-25 ENCOUNTER — Ambulatory Visit (HOSPITAL_COMMUNITY)
Admission: RE | Admit: 2014-02-25 | Discharge: 2014-02-25 | Disposition: A | Discharge status: Home / Self Care | Payer: Medicare Other | Source: Ambulatory Visit | Attending: Gastroenterology | Admitting: Gastroenterology

## 2014-02-25 ENCOUNTER — Ambulatory Visit (HOSPITAL_COMMUNITY): Payer: Medicare Other

## 2014-02-25 DIAGNOSIS — N3642 Intrinsic sphincter deficiency (ISD): Secondary | ICD-10-CM | POA: Insufficient documentation

## 2014-02-25 DIAGNOSIS — Z950 Presence of cardiac pacemaker: Secondary | ICD-10-CM | POA: Diagnosis not present

## 2014-02-25 DIAGNOSIS — K219 Gastro-esophageal reflux disease without esophagitis: Secondary | ICD-10-CM

## 2014-02-25 DIAGNOSIS — M949 Disorder of cartilage, unspecified: Secondary | ICD-10-CM

## 2014-02-25 DIAGNOSIS — D131 Benign neoplasm of stomach: Secondary | ICD-10-CM | POA: Diagnosis not present

## 2014-02-25 DIAGNOSIS — M353 Polymyalgia rheumatica: Secondary | ICD-10-CM | POA: Insufficient documentation

## 2014-02-25 DIAGNOSIS — Z6827 Body mass index (BMI) 27.0-27.9, adult: Secondary | ICD-10-CM | POA: Insufficient documentation

## 2014-02-25 DIAGNOSIS — K2100 Gastro-esophageal reflux disease with esophagitis, without bleeding: Secondary | ICD-10-CM | POA: Insufficient documentation

## 2014-02-25 DIAGNOSIS — R1310 Dysphagia, unspecified: Secondary | ICD-10-CM | POA: Diagnosis present

## 2014-02-25 DIAGNOSIS — M899 Disorder of bone, unspecified: Secondary | ICD-10-CM | POA: Insufficient documentation

## 2014-02-25 DIAGNOSIS — E669 Obesity, unspecified: Secondary | ICD-10-CM | POA: Insufficient documentation

## 2014-02-25 DIAGNOSIS — K220 Achalasia of cardia: Secondary | ICD-10-CM | POA: Insufficient documentation

## 2014-02-25 DIAGNOSIS — I495 Sick sinus syndrome: Secondary | ICD-10-CM | POA: Diagnosis not present

## 2014-02-25 DIAGNOSIS — R0989 Other specified symptoms and signs involving the circulatory and respiratory systems: Secondary | ICD-10-CM | POA: Diagnosis not present

## 2014-02-25 DIAGNOSIS — N3946 Mixed incontinence: Secondary | ICD-10-CM | POA: Diagnosis not present

## 2014-02-25 DIAGNOSIS — K224 Dyskinesia of esophagus: Secondary | ICD-10-CM

## 2014-02-25 HISTORY — PX: ESOPHAGOGASTRODUODENOSCOPY: SHX5428

## 2014-02-25 HISTORY — PX: BOTOX INJECTION: SHX5754

## 2014-02-25 SURGERY — EGD (ESOPHAGOGASTRODUODENOSCOPY)
Anesthesia: Moderate Sedation

## 2014-02-25 MED ORDER — MIDAZOLAM HCL 10 MG/2ML IJ SOLN
INTRAMUSCULAR | Status: AC
  Filled 2014-02-25: qty 2

## 2014-02-25 MED ORDER — SODIUM CHLORIDE 0.9 % IV SOLN
INTRAVENOUS | Status: DC
  Administered 2014-02-25: 12:00:00 via INTRAVENOUS

## 2014-02-25 MED ORDER — FENTANYL CITRATE 0.05 MG/ML IJ SOLN
INTRAMUSCULAR | Status: AC
  Filled 2014-02-25: qty 2

## 2014-02-25 MED ORDER — SODIUM CHLORIDE 0.9 % IJ SOLN
100.0000 [IU] | Freq: Once | INTRAMUSCULAR | Status: DC
  Filled 2014-02-25: qty 100

## 2014-02-25 MED ORDER — MIDAZOLAM HCL 10 MG/2ML IJ SOLN
INTRAMUSCULAR | Status: DC | PRN
  Administered 2014-02-25 (×2): 2 mg via INTRAVENOUS

## 2014-02-25 MED ORDER — BUTAMBEN-TETRACAINE-BENZOCAINE 2-2-14 % EX AERO
INHALATION_SPRAY | CUTANEOUS | Status: DC | PRN
  Administered 2014-02-25: 2 via TOPICAL

## 2014-02-25 MED ORDER — DIPHENHYDRAMINE HCL 50 MG/ML IJ SOLN
INTRAMUSCULAR | Status: AC
  Filled 2014-02-25: qty 1

## 2014-02-25 MED ORDER — FENTANYL CITRATE 0.05 MG/ML IJ SOLN
INTRAMUSCULAR | Status: DC | PRN
  Administered 2014-02-25 (×2): 25 ug via INTRAVENOUS

## 2014-02-25 NOTE — H&P (View-Only) (Signed)
Reviewed and agree with management. Robert D. Kaplan, M.D., FACG  

## 2014-02-25 NOTE — Op Note (Signed)
[facility] [place], [id number]   ENDOSCOPY PROCEDURE REPORT  PATIENT: Jill, Oneill  MR#: [id number] BIRTHDATE: Nov 16, 1928 , 85  yrs. old GENDER: Female ENDOSCOPIST: Inda Castle, MD REFERRED BY: PROCEDURE DATE:  02/25/2014 PROCEDURE:  EGD w/ directed submucosal injection(s), any substance ASA CLASS:     Class III INDICATIONS:  Dysphagia., probable achalasia MEDICATIONS: These medications were titrated to patient response per physician's verbal order, Versed 4 mg IV, and Fentanyl 50 mcg IV TOPICAL ANESTHETIC: Cetacaine Spray  DESCRIPTION OF PROCEDURE: After the risks benefits and alternatives of the procedure were thoroughly explained, informed consent was obtained.  The    endoscope was introduced through the mouth and advanced to the third portion of the duodenum. Without limitations. The instrument was slowly withdrawn as the mucosa was fully examined.      The entire esophagus was markedly dilated consistent with a "megaesophagus".  There was a large amount of retained fluid and solid food.  With difficulty the GE junction was identified.  The area was stenotic but the 9 mm scope easily traversed the stenotic area.  In the stomach there are multiple 1-2 mm sessile polyps in the fundus.  The remainder of the exam was normal.   The entire esophagus was markedly dilated consistent with a "megaesophagus". There was a large amount of retained fluid and solid food.  With difficulty the GE junction was identified.  The area was stenotic but the 9 mm scope easily traversed the stenotic area.  In the stomach there are multiple 1-2 mm sessile polyps in the fundus. The remainder of the exam was normal.  Retroflexed views revealed no abnormalities. At the GE junction 25 units (1 cc) of Botox was injected into each of the 4 quadrants, submucosally    The scope was then withdrawn from the patient and the procedure completed.  COMPLICATIONS:  There were no complications. ENDOSCOPIC IMPRESSION: 1.   achalasia status post Botox injection  RECOMMENDATIONS: office visit to five-weeks REPEAT EXAM:  eSigned:  Inda Castle, MD 02/25/2014 1:06 PM   [id number] Cloyde Reams, MD  PATIENT NAME:  Jill, Oneill MR#: [id number]

## 2014-02-25 NOTE — Interval H&P Note (Signed)
History and Physical Interval Note:  02/25/2014 12:39 PM  [name]  has presented today for surgery, with the diagnosis of Recurrent dysphagia Chronic gerd Motility disorder  The various methods of treatment have been discussed with the patient and family. After consideration of risks, benefits and other options for treatment, the patient has consented to  Procedure(s): ESOPHAGOGASTRODUODENOSCOPY (EGD) (N/A) BALLOON DILATION (N/A) as a surgical intervention .  The patient's history has been reviewed, patient examined, no change in status, stable for surgery.  I have reviewed the patient's chart and labs.  Questions were answered to the patient's satisfaction.     The recent H&P (dated **02/04/14*) was reviewed, the patient was examined and there is no change in the patients condition since that H&P was completed.   Erskine Emery  02/25/2014, 12:39 PM   Erskine Emery

## 2014-02-25 NOTE — Discharge Instructions (Addendum)
Conscious Sedation °Sedation is the use of medicines to promote relaxation and relieve discomfort and anxiety. Conscious sedation is a type of sedation. Under conscious sedation you are less alert than normal but are still able to respond to instructions or stimulation. Conscious sedation is used during short medical and dental procedures. It is milder than deep sedation or general anesthesia and allows you to return to your regular activities sooner.  °LET YOUR HEALTH CARE PROVIDER KNOW ABOUT:  °· Any allergies you have. °· All medicines you are taking, including vitamins, herbs, eye drops, creams, and over-the-counter medicines. °· Use of steroids (by mouth or creams). °· Previous problems you or members of your family have had with the use of anesthetics. °· Any blood disorders you have. °· Previous surgeries you have had. °· Medical conditions you have. °· Possibility of pregnancy, if this applies. °· Use of cigarettes, alcohol, or illegal drugs. °RISKS AND COMPLICATIONS °Generally, this is a safe procedure. However, as with any procedure, problems can occur. Possible problems include: °· Oversedation. °· Trouble breathing on your own. You may need to have a breathing tube until you are awake and breathing on your own. °· Allergic reaction to any of the medicines used for the procedure. °BEFORE THE PROCEDURE °· You may have blood tests done. These tests can help show how well your kidneys and liver are working. They can also show how well your blood clots. °· A physical exam will be done.   °· Only take medicines as directed by your health care provider. You may need to stop taking medicines (such as blood thinners, aspirin, or nonsteroidal anti-inflammatory drugs) before the procedure.   °· Do not eat or drink at least 6 hours before the procedure or as directed by your health care provider. °· Arrange for a responsible adult, family member, or friend to take you home after the procedure. He or she should stay  with you for at least 24 hours after the procedure, until the medicine has worn off. °PROCEDURE  °· An intravenous (IV) catheter will be inserted into one of your veins. Medicine will be able to flow directly into your body through this catheter. You may be given medicine through this tube to help prevent pain and help you relax. °· The medical or dental procedure will be done. °AFTER THE PROCEDURE °· You will stay in a recovery area until the medicine has worn off. Your blood pressure and pulse will be checked.   °·  Depending on the procedure you had, you may be allowed to go home when you can tolerate liquids and your pain is under control. °Document Released: 03/23/2001 Document Revised: 07/03/2013 Document Reviewed: 03/05/2013 °Exit[employer]® Patient Information ©2015 [employer]. This information is not intended to replace advice given to you by your health care provider. Make sure you discuss any questions you have with your health care provider. ° °

## 2014-02-25 NOTE — Progress Notes (Signed)
Patient noted to have a congested cough with minimal thin, clear secretions post EGD. Unable to keep O2 sats above 90% on room air. Notified Dr Deatra Ina and obtained chest xray. Upon time of discharge, patient able to keep sats 95% or better on room air, and coughing has decreased. Chest xray shows no changes from previous exam. Discharged home, patient verbalizes understanding of going to emergency room if breathing changes or new symptoms start. Patient "feels fine" upon discharge.

## 2014-02-26 ENCOUNTER — Encounter (HOSPITAL_COMMUNITY): Payer: Self-pay | Admitting: Gastroenterology

## 2014-02-26 ENCOUNTER — Telehealth: Payer: Self-pay

## 2014-02-26 NOTE — Telephone Encounter (Signed)
Patient had an EGD with Botox injection at [facility]. Per Dr Deatra Ina, she needs a follow up appointment in 5 weeks. An appointment has been made with Amy Esterwood for 04/03/14 at 9:30 am. Amy saw patient originally. I have left a message for the patient to call back.

## 2014-02-27 NOTE — Telephone Encounter (Signed)
Patient calls back. She agrees to the follow up appt on 04/03/14 at 9:30.

## 2014-03-12 DIAGNOSIS — Z45018 Encounter for adjustment and management of other part of cardiac pacemaker: Secondary | ICD-10-CM | POA: Insufficient documentation

## 2014-03-12 DIAGNOSIS — I495 Sick sinus syndrome: Secondary | ICD-10-CM | POA: Diagnosis not present

## 2014-03-25 DIAGNOSIS — R32 Unspecified urinary incontinence: Secondary | ICD-10-CM | POA: Diagnosis not present

## 2014-03-25 DIAGNOSIS — I10 Essential (primary) hypertension: Secondary | ICD-10-CM | POA: Diagnosis not present

## 2014-03-25 DIAGNOSIS — E042 Nontoxic multinodular goiter: Secondary | ICD-10-CM | POA: Diagnosis not present

## 2014-03-25 LAB — BASIC METABOLIC PANEL
BUN: 17 mg/dL (ref 4–21)
CREATININE: 0.6 mg/dL (ref 0.5–1.1)
Glucose: 87 mg/dL
Potassium: 3.7 mmol/L (ref 3.4–5.3)
Sodium: 141 mmol/L (ref 137–147)

## 2014-03-25 LAB — TSH: TSH: 1.43 u[IU]/mL (ref 0.41–5.90)

## 2014-04-02 ENCOUNTER — Non-Acute Institutional Stay: Payer: Medicare Other | Admitting: Internal Medicine

## 2014-04-02 ENCOUNTER — Encounter: Payer: Self-pay | Admitting: Internal Medicine

## 2014-04-02 VITALS — BP 120/62 | HR 76 | Wt 149.0 lb

## 2014-04-02 DIAGNOSIS — M949 Disorder of cartilage, unspecified: Secondary | ICD-10-CM

## 2014-04-02 DIAGNOSIS — I10 Essential (primary) hypertension: Secondary | ICD-10-CM | POA: Diagnosis not present

## 2014-04-02 DIAGNOSIS — R609 Edema, unspecified: Secondary | ICD-10-CM

## 2014-04-02 DIAGNOSIS — M898X1 Other specified disorders of bone, shoulder: Secondary | ICD-10-CM | POA: Insufficient documentation

## 2014-04-02 DIAGNOSIS — R7309 Other abnormal glucose: Secondary | ICD-10-CM

## 2014-04-02 DIAGNOSIS — Z950 Presence of cardiac pacemaker: Secondary | ICD-10-CM | POA: Diagnosis not present

## 2014-04-02 DIAGNOSIS — R739 Hyperglycemia, unspecified: Secondary | ICD-10-CM

## 2014-04-02 DIAGNOSIS — K220 Achalasia of cardia: Secondary | ICD-10-CM

## 2014-04-02 DIAGNOSIS — R1319 Other dysphagia: Secondary | ICD-10-CM

## 2014-04-02 DIAGNOSIS — M899 Disorder of bone, unspecified: Secondary | ICD-10-CM

## 2014-04-02 DIAGNOSIS — E042 Nontoxic multinodular goiter: Secondary | ICD-10-CM

## 2014-04-02 NOTE — Progress Notes (Signed)
Patient ID: KIZZI OVERBEY, female   DOB: May 19, 1929, 80 y.o.   MRN: [id number]    Location:  [facility]   Place of Service: Clinic (12)    Allergies  Allergen Reactions  . Actonel [Risedronate Sodium] Other (See Comments)    Joint aches; rechallenged --caused joint aches  . Ivp Dye [Iodinated Diagnostic Agents] Nausea And Vomiting    Chief Complaint  Patient presents with  . Medical Management of Chronic Issues    blood pressure, blood sugar, thyroid    HPI:  Cardiac pacemaker in situ: functioning. Switching care from [facility] to Dr. Caryl Comes  Edema: improved  Essential hypertension: controlled  Hyperglycemia: normal on last lab  Multinodular thyroid: unchanged  Megaesophagus: likely part of the achalasia.  Other dysphagia: related to achalasia. Has had Botox injections  Pain of right scapula: recurrent and always at the medial side of th right scapula muscles  Achalasia of esophagus: followed by Dr. Deatra Ina    Medications: Patient's Medications  New Prescriptions   No medications on file  Previous Medications   ACETAMINOPHEN (TYLENOL) 325 MG TABLET    Take 650 mg by mouth. Take 2 tablets twice daily as needed for pain   ASCORBIC ACID (VITAMIN C) 500 MG TABLET    Take 500 mg by mouth. Take one tablet daily   B COMPLEX VITAMINS CAPSULE    Take 1 capsule by mouth daily.   CALCIUM CARBONATE-VIT D-MIN (CALCIUM 1200 PO)    Take by mouth. Take one daily   CHOLECALCIFEROL (VITAMIN D-3) 5000 UNITS TABS    Take by mouth. Take one capsule daily   DIGESTIVE AIDS MIXTURE PO    Take by mouth. Take one capsule daily   IBUPROFEN (ADVIL,MOTRIN) 200 MG TABLET    Take by mouth. Take 2 two times daily as needed for pain   METOPROLOL SUCCINATE (TOPROL-XL) 25 MG 24 HR TABLET    TAKE 1 TABLET DAILY TO CONTROL BLOOD PRESSURE AND HEART RHYTHM   OMEGA 3 1200 MG CAPS    Take by mouth. Take one capsule daily   PREDNISONE (DELTASONE) 1 MG TABLET    Take 1 mg by mouth. Take 1 tablet  daily to treat muscular pains   PROBIOTIC PRODUCT (PROBIOTIC DAILY PO)    Take by mouth. Take one daily   RANITIDINE (ZANTAC) 150 MG TABLET    Take 150 mg by mouth. Take one tablet in morning and two in evening  Modified Medications   No medications on file  Discontinued Medications   No medications on file     Review of Systems  Constitutional: Negative for fever, activity change, appetite change and fatigue.       Mildly overweight.  Eyes: Negative.   Respiratory:       Aspiration pneumonia December 4360 complicated by subsequent empyema. Appears fully recovered as of 07/17/13.  Cardiovascular: Negative for chest pain, palpitations and leg swelling.  Gastrointestinal:       Known achalasia and megaesophagus. Some dysphagia.   Endocrine: Negative.   Genitourinary:       Chronic urinary leakage. Wears protective pads for at least 10 years. She has seen urologist in the past. Sling surgery was proposed, but she did not want to go through this.  Musculoskeletal: Positive for myalgias. Negative for back pain and gait problem.       Diffuse muscular pains, worse in the shoulders.  Skin: Negative.   Allergic/Immunologic: Negative.   Neurological: Negative.   Hematological:  History of anemia.  Psychiatric/Behavioral: Negative.     Filed Vitals:   04/02/14 0937  BP: 120/62  Pulse: 76  Weight: 149 lb (67.586 kg)   Body mass index is 27.25 kg/(m^2).  Physical Exam  Constitutional: She is oriented to person, place, and time. She appears well-developed and well-nourished. No distress.  HENT:  Head: Normocephalic and atraumatic.  Right Ear: External ear normal.  Left Ear: External ear normal.  Nose: Nose normal.  Mouth/Throat: Oropharynx is clear and moist.  Eyes:  Corrective lenses worn.  Neck: Thyromegaly present.  Multinodular goiter with nodules palpable in all 4 lobes. Cyst in the right upper lobe of the thyroid is particularly large.  Cardiovascular: Normal rate,  regular rhythm and intact distal pulses.  Exam reveals friction rub. Exam reveals no gallop.   No murmur heard. Pulmonary/Chest: Effort normal and breath sounds normal. No respiratory distress. She has no wheezes. She has no rales. She exhibits no tenderness.  Abdominal: Soft. Bowel sounds are normal. She exhibits no distension and no mass. There is no tenderness.  Musculoskeletal: Normal range of motion. She exhibits no edema and no tenderness.  Left leg is shorter than the right. Left 2nd and 4th hammer toes. Left 3rd toe had corrective surgery for hammer toe.  Neurological: She is alert and oriented to person, place, and time. She has normal reflexes. No cranial nerve deficit. Coordination normal.  Skin: No rash noted. No erythema.  Psychiatric: She has a normal mood and affect. Her behavior is normal. Judgment and thought content normal.     Labs reviewed: [facility] Home on 04/02/2014  Component Date Value Ref Range Status  . Glucose 03/25/2014 87   Final  . BUN 03/25/2014 17  4 - 21 mg/dL Final  . Creatinine 03/25/2014 0.6  0.5 - 1.1 mg/dL Final  . Potassium 03/25/2014 3.7  3.4 - 5.3 mmol/L Final  . Sodium 03/25/2014 141  137 - 147 mmol/L Final  . TSH 03/25/2014 1.43  0.41 - 5.90 uIU/mL Final    Assessment/Plan  1. Cardiac pacemaker in situ Continue to see cardiology. Switching to Dr. Deatra Ina  2. Edema resolved  3. Essential hypertension controlled  4. Hyperglycemia Controlled by diet  5. Multinodular thyroid Unchanged. Normal TSH.  6. Megaesophagus Continue to see GI  7. Other dysphagia Continue to see GI  8. Pain of right scapula Try warm packs and Myoflex  9. Achalasia of esophagus Continue to see GI

## 2014-04-03 ENCOUNTER — Encounter: Payer: Self-pay | Admitting: Physician Assistant

## 2014-04-03 ENCOUNTER — Ambulatory Visit (INDEPENDENT_AMBULATORY_CARE_PROVIDER_SITE_OTHER): Payer: Medicare Other | Admitting: Physician Assistant

## 2014-04-03 VITALS — BP 118/60 | HR 80 | Ht 60.25 in | Wt 149.1 lb

## 2014-04-03 DIAGNOSIS — K220 Achalasia of cardia: Secondary | ICD-10-CM | POA: Diagnosis not present

## 2014-04-03 NOTE — Patient Instructions (Signed)
Please call for a follow up appointment with Amy Esterwood PA-C when swallowing difficulties worsen. Continue current dose of Zantac. Follow anti-refulx regimen.

## 2014-04-03 NOTE — Progress Notes (Signed)
Subjective:    Patient ID: [name], female    DOB: December 26, 1928, 80 y.o.   MRN: [id number]  HPI  [name] is a very nice 80 year old white female known to Dr. Deatra Ina who has history of any significant esophageal motility disorder with previous manometry done at [facility] showing no ineffective peristalsis however LES pressure was not elevated and therefore not felt entirely consistent with achalasia. Patient has undergone both balloon dilations and Botox injections in the past per Dr. Deatra Ina. Patient was in in July of 2015 with progressive dysphagia primarily to solids and also milder symptoms to liquids. She was also having reflux symptoms and her ranitidine was increased. She was scheduled for followup EGD with balloon dilation versus Botox injection. She underwent procedure with Dr. Deatra Ina on 02/25/2014. She was noted to have a megaesophagus with a large amount of retained food and liquid in her esophagus. She was felt to have a stenotic GE junction which did allow a 9 mm scope to pass. She had Botox injected into 4 quadrants. She was also noted to have multiple fundic gland polyps. Patient comes back in today for followup. She says she did get her response from the Botox injections and feels that her swallowing did improve. She is following a strict antireflux regimen elevating the head of her bed and sitting upright post meals. She says she still has some difficulty with food sticking and has had 2 episodes requiring regurgitation since the procedure. She says she is very careful with her swallowing eats very small bites and follows most of her foods with liquids. She does not feel that she needs another procedure at this time, but has felt a balloon dilation has held in the past as well.    Review of Systems  Constitutional: Negative.   HENT: Positive for trouble swallowing.   Eyes: Negative.   Respiratory: Negative.   Cardiovascular: Negative.   Gastrointestinal: Negative.     Endocrine: Negative.   Genitourinary: Negative.   Musculoskeletal: Negative.   Allergic/Immunologic: Negative.   Neurological: Negative.   Hematological: Negative.   Psychiatric/Behavioral: Negative.    Outpatient Prescriptions Prior to Visit  Medication Sig Dispense Refill  . acetaminophen (TYLENOL) 325 MG tablet Take 650 mg by mouth. Take 2 tablets twice daily as needed for pain      . ascorbic acid (VITAMIN C) 500 MG tablet Take 500 mg by mouth. Take one tablet daily      . b complex vitamins capsule Take 1 capsule by mouth daily.      . Calcium Carbonate-Vit D-Min (CALCIUM 1200 PO) Take by mouth. Take one daily      . Cholecalciferol (VITAMIN D-3) 5000 UNITS TABS Take by mouth. Take one capsule daily      . DIGESTIVE AIDS MIXTURE PO Take by mouth. Take one capsule daily      . ibuprofen (ADVIL,MOTRIN) 200 MG tablet Take by mouth. Take 2 two times daily as needed for pain      . metoprolol succinate (TOPROL-XL) 25 MG 24 hr tablet TAKE 1 TABLET DAILY TO CONTROL BLOOD PRESSURE AND HEART RHYTHM  90 tablet  1  . Omega 3 1200 MG CAPS Take by mouth. Take one capsule daily      . predniSONE (DELTASONE) 1 MG tablet Take 1 mg by mouth. Take 1 tablet daily to treat muscular pains      . Probiotic Product (PROBIOTIC DAILY PO) Take by mouth. Take one daily      .  ranitidine (ZANTAC) 150 MG tablet Take 150 mg by mouth. Take one tablet in morning and two in evening       No facility-administered medications prior to visit.   Allergies  Allergen Reactions  . Actonel [Risedronate Sodium] Other (See Comments)    Joint aches; rechallenged --caused joint aches  . Ivp Dye [Iodinated Diagnostic Agents] Nausea And Vomiting   Patient Active Problem List   Diagnosis Date Noted  . Pain of right scapula 04/02/2014  . Fitting and adjustment of cardiac pacemaker 03/12/2014  . Osteopenia 11/13/2013  . Urinary incontinence 11/13/2013  . Idiopathic angio-edema-urticaria 11/13/2013  . Anemia 07/17/2013   . Hyperglycemia 07/17/2013  . Intrinsic sphincter deficiency 05/24/2013  . Urge and stress incontinence 05/24/2013  . Reflux esophagitis 01/28/2013  . Sinoatrial node dysfunction 01/05/2013  . Sick sinus syndrome 01/05/2013  . Sleep apnea 11/21/2012  . Disorder of bone and cartilage 11/21/2012  . Obesity 11/21/2012  . Edema 11/21/2012  . Essential hypertension 11/21/2012  . Personal history of other diseases of circulatory system 11/21/2012  . Achalasia of esophagus 10/10/2012  . Gastric AVM 08/07/2012  . Other dysphagia 06/27/2012  . Multinodular thyroid 06/26/2012  . Megaesophagus 06/20/2012  . Polymyalgia rheumatica 06/19/2012  . Mediastinal mass 06/19/2012  . Hammer toe 04/17/2012  . Encounter for long-term (current) use of steroids 01/27/2012  . Long term current use of systemic steroids 01/27/2012  . Paroxysmal ventricular tachycardia 01/18/2012  . Cardiac pacemaker in situ 01/18/2012  . Non-sustained ventricular tachycardia 01/18/2012  . Sprain of wrist 05/10/2011  . Disorder of rotator cuff syndrome of shoulder and allied disorder 04/01/2011   History  Substance Use Topics  . Smoking status: Never Smoker   . Smokeless tobacco: Never Used  . Alcohol Use: 1.8 oz/week    3 Glasses of wine per week      family history includes Arthritis in her brother and father; Cancer in her brother; Diabetes in her son; Heart disease in her brother, brother, father, and mother; Hypertension in her brother. There is no history of Colon cancer.  Objective:   Physical Exam  well-developed elderly female in no acute distress, pleasant blood pressure 118/60 pulse 80 height 5 foot weight 149 not further examined today        Assessment & Plan:  #[age] year old white female with esophageal motility disorder with a megaesophagus/aperistaltic and a stenotic GE junction. Previous manometry however was not consistent with achalasia. Patient has had some response to both Botox and balloon  dilations in the past. She is now one month status post Botox injection and did have some benefit though continues to have milder intermittent dysphagia with regurgitation. She is not interested in having another procedure at this time and would like to see how she does over the next few months.  Plan; patient will continue ranitidine 150 mg every morning and 300 mg every afternoon as well as strict antireflux regimen and remaining upright for 2 hours post each meal. She will call back in the next 2-3 months if any of her symptoms progress and at that time may consider EGD with balloon dilation.

## 2014-04-04 NOTE — Progress Notes (Signed)
Reviewed and agree with management. Homero Hyson D. Koby Hartfield, M.D., FACG  

## 2014-04-05 ENCOUNTER — Encounter: Payer: Self-pay | Admitting: Internal Medicine

## 2014-04-27 DIAGNOSIS — Z23 Encounter for immunization: Secondary | ICD-10-CM | POA: Diagnosis not present

## 2014-04-28 ENCOUNTER — Emergency Department (HOSPITAL_COMMUNITY): Payer: Medicare Other

## 2014-04-28 ENCOUNTER — Encounter (HOSPITAL_COMMUNITY): Payer: Self-pay | Admitting: Emergency Medicine

## 2014-04-28 ENCOUNTER — Emergency Department (HOSPITAL_COMMUNITY)
Admission: EM | Admit: 2014-04-28 | Discharge: 2014-04-28 | Disposition: A | Discharge status: Home / Self Care | Payer: Medicare Other | Attending: Emergency Medicine | Admitting: Emergency Medicine

## 2014-04-28 DIAGNOSIS — Z9981 Dependence on supplemental oxygen: Secondary | ICD-10-CM | POA: Diagnosis not present

## 2014-04-28 DIAGNOSIS — M353 Polymyalgia rheumatica: Secondary | ICD-10-CM | POA: Insufficient documentation

## 2014-04-28 DIAGNOSIS — Z7952 Long term (current) use of systemic steroids: Secondary | ICD-10-CM | POA: Insufficient documentation

## 2014-04-28 DIAGNOSIS — Z79899 Other long term (current) drug therapy: Secondary | ICD-10-CM | POA: Diagnosis not present

## 2014-04-28 DIAGNOSIS — R0602 Shortness of breath: Secondary | ICD-10-CM | POA: Diagnosis not present

## 2014-04-28 DIAGNOSIS — Z8639 Personal history of other endocrine, nutritional and metabolic disease: Secondary | ICD-10-CM | POA: Insufficient documentation

## 2014-04-28 DIAGNOSIS — G4733 Obstructive sleep apnea (adult) (pediatric): Secondary | ICD-10-CM | POA: Insufficient documentation

## 2014-04-28 DIAGNOSIS — M479 Spondylosis, unspecified: Secondary | ICD-10-CM | POA: Insufficient documentation

## 2014-04-28 DIAGNOSIS — J90 Pleural effusion, not elsewhere classified: Secondary | ICD-10-CM | POA: Diagnosis not present

## 2014-04-28 DIAGNOSIS — R062 Wheezing: Secondary | ICD-10-CM | POA: Diagnosis not present

## 2014-04-28 DIAGNOSIS — Z87448 Personal history of other diseases of urinary system: Secondary | ICD-10-CM | POA: Diagnosis not present

## 2014-04-28 DIAGNOSIS — Z950 Presence of cardiac pacemaker: Secondary | ICD-10-CM | POA: Insufficient documentation

## 2014-04-28 DIAGNOSIS — R0989 Other specified symptoms and signs involving the circulatory and respiratory systems: Secondary | ICD-10-CM | POA: Diagnosis not present

## 2014-04-28 DIAGNOSIS — I10 Essential (primary) hypertension: Secondary | ICD-10-CM | POA: Insufficient documentation

## 2014-04-28 DIAGNOSIS — Z8709 Personal history of other diseases of the respiratory system: Secondary | ICD-10-CM | POA: Insufficient documentation

## 2014-04-28 DIAGNOSIS — R05 Cough: Secondary | ICD-10-CM | POA: Diagnosis not present

## 2014-04-28 DIAGNOSIS — K219 Gastro-esophageal reflux disease without esophagitis: Secondary | ICD-10-CM | POA: Insufficient documentation

## 2014-04-28 NOTE — ED Notes (Signed)
Pt from home c/o getting choked on spaghetti (has resolved) she gets short of breath when exerting self. She states "I''m much better now" she is concerned about aspiration because that has happened before. HX of esophageal stricture. Pt not in distress. Lung sounds clear bilaterally.

## 2014-04-28 NOTE — ED Provider Notes (Signed)
CSN: [id number]     Arrival date & time 04/28/14  1633 History   First MD Initiated Contact with Patient 04/28/14 1722     Chief Complaint  Patient presents with  . Shortness of Breath     (Consider location/radiation/quality/duration/timing/severity/associated sxs/prior Treatment) HPI Comments: Patient had a sudden episode of choking and shortness of breath while eating spaghetti for lunch. She states that she vomited up the spaghetti but still had some shortness of breath which has been gradually improving. She's history of similar episodes in the past and had to have a VATS procedure due to a empyema that formed after aspiration  Patient is a 80 y.o. female presenting with shortness of breath. The history is provided by the patient. No language interpreter was used.  Shortness of Breath Severity:  Moderate Onset quality:  Sudden Timing:  Constant Progression:  Improving Chronicity:  New Relieved by:  Rest Worsened by:  Movement Ineffective treatments:  Inhaler Associated symptoms: cough, sputum production (clear) and wheezing   Associated symptoms: no abdominal pain, no chest pain, no diaphoresis, no fever, no headaches, no neck pain, no sore throat and no vomiting   Risk factors: no tobacco use     Past Medical History  Diagnosis Date  . GERD (gastroesophageal reflux disease)   . Empyema, right PULMOLOGIST-- DR CLANCE    VATS 06/23/2012 cultures negative to date CXR 07/19/12 persistent airfluid levels/  CXR 11-01-2012 IMPROVE RIGHT PLEURAL EFFUSION  . Polymyalgia rheumatica     on chronic Prednisone 5mg  daily  . Arthritis     back  . Hypertension   . OSA on CPAP   . Intrinsic urethral sphincter deficiency   . Megaesophagus   . Multiple thyroid nodules     BILATERAL --  LARGE RIGHT UPPER LOBE (MONITORED BY PCP)  . RBBB   . Chronic steroid use   . History of aspiration pneumonitis     DEC 2013  . Mixed stress and urge urinary incontinence   . Pacemaker [employer]--   LAST PACER CHECK 05-09-2013  PER PT CHECK OUT OK    CARIOLOGIST--  DR RUTH GREENFIELD AT [facility]--  PT HAS HAD NORMAL STRESS TEST AND ECHO   . Complete heart block     S/P PACEMAKER 2000 W/ GENERATOR CHANGE 2009  . Normal cardiac stress test     PER PT AT [facility]  . H/O echocardiogram     NORMAL PER PT DONE AT [facility]  . S/P dilatation of esophageal stricture    Past Surgical History  Procedure Laterality Date  . Video bronchoscopy  06/23/2012    Procedure: VIDEO BRONCHOSCOPY;  Surgeon: Grace Isaac, MD;  Location: [place];  Service: Thoracic;  Laterality: N/A;  . Video assisted thoracoscopy (vats)/decortication  06/23/2012    Procedure: VIDEO ASSISTED THORACOSCOPY (VATS)/DECORTICATION;  Surgeon: Grace Isaac, MD;  Location: [place];  Service: Thoracic;  Laterality: Right;  . Esophagogastroduodenoscopy (egd) with esophageal dilation  06/27/2012    Procedure: ESOPHAGOGASTRODUODENOSCOPY (EGD) WITH ESOPHAGEAL DILATION;  Surgeon: Ladene Artist, MD,FACG;  Location: [place];  Service: Endoscopy;  Laterality: N/A;  . Video assisted thoracoscopy (vats)/empyema      06/23/2012  . Esophagogastroduodenoscopy  07/24/2012    Procedure: ESOPHAGOGASTRODUODENOSCOPY (EGD);  Surgeon: Inda Castle, MD;  Location: [facility];  Service: Endoscopy;  Laterality: N/A;  . Balloon dilation  07/24/2012    Procedure: BALLOON DILATION;  Surgeon: Inda Castle, MD;  Location: [facility];  Service: Endoscopy;  Laterality: N/A;  .  Esophagogastroduodenoscopy  08/07/2012    Procedure: ESOPHAGOGASTRODUODENOSCOPY (EGD);  Surgeon: Inda Castle, MD;  Location: [facility];  Service: Endoscopy;  Laterality: N/A;  . Botox injection  08/07/2012    Procedure: BOTOX INJECTION;  Surgeon: Inda Castle, MD;  Location: [facility];  Service: Endoscopy;  Laterality: N/A;  . Toe surgery  2013    left 3rd toe HAMMERTOE REPAIR  . Total hip arthroplasty Right 2001  . Tonsillectomy  AS CHILD  . Cataract extraction w/  intraocular lens  implant, bilateral  2005  . Vericose vein ligation    . Cardiac pacemaker placement  06/1999  DR RUTH GREENFIELD AT [facility]     [employer]  ( LAST PACER CHECK 05-09-2013) for CHB/   END-OF-LIFE GENERATOR CHANGE  2009  . Appendectomy  1953    w/ removal benign kidney tumor   . Botox injection N/A 05/24/2013    Procedure: MACROPLASTIQUE IMPLANT;  Surgeon: Irine Seal, MD;  Location: [facility];  Service: Urology;  Laterality: N/A;  . Joint replacement      right hip, 2001  . Esophagogastroduodenoscopy N/A 02/25/2014    Procedure: ESOPHAGOGASTRODUODENOSCOPY (EGD);  Surgeon: Inda Castle, MD;  Location: [facility];  Service: Endoscopy;  Laterality: N/A;  . Botox injection  02/25/2014    Procedure: BOTOX INJECTION;  Surgeon: Inda Castle, MD;  Location: [facility];  Service: Endoscopy;;  . Pacemaker generator change  12/13/2007    at [place] [facility] History  Problem Relation Age of Onset  . Arthritis Father   . Heart disease Father   . Cancer Brother     ?spine  . Heart disease Brother   . Diabetes Son   . Heart disease Mother     Congestive heart failure  . Heart disease Brother   . Arthritis Brother   . Hypertension Brother   . Colon cancer Neg Hx    History  Substance Use Topics  . Smoking status: Never Smoker   . Smokeless tobacco: Never Used  . Alcohol Use: 1.8 oz/week    3 Glasses of wine per week   OB History   Grav Para Term Preterm Abortions TAB SAB Ect Mult Living                 Review of Systems  Constitutional: Negative for fever, chills, diaphoresis, activity change, appetite change and fatigue.  HENT: Negative for congestion, facial swelling, rhinorrhea and sore throat.   Eyes: Negative for photophobia and discharge.  Respiratory: Positive for cough, sputum production (clear), shortness of breath and wheezing. Negative for chest tightness.   Cardiovascular: Negative for chest pain, palpitations and leg swelling.   Gastrointestinal: Negative for nausea, vomiting, abdominal pain and diarrhea.  Endocrine: Negative for polydipsia and polyuria.  Genitourinary: Negative for dysuria, frequency, difficulty urinating and pelvic pain.  Musculoskeletal: Negative for arthralgias, back pain, neck pain and neck stiffness.  Skin: Negative for color change and wound.  Allergic/Immunologic: Negative for immunocompromised state.  Neurological: Negative for facial asymmetry, weakness, numbness and headaches.  Hematological: Does not bruise/bleed easily.  Psychiatric/Behavioral: Negative for confusion and agitation.      Allergies  Actonel and Ivp dye  Home Medications   Prior to Admission medications   Medication Sig Start Date End Date Taking? Authorizing Provider  acetaminophen (TYLENOL) 325 MG tablet Take 650 mg by mouth every 6 (six) hours as needed for moderate pain (arthritis pain).    Yes Historical Provider, MD  ascorbic acid (VITAMIN  C) 500 MG tablet Take 500 mg by mouth daily.    Yes Historical Provider, MD  b complex vitamins capsule Take 1 capsule by mouth daily.   Yes Historical Provider, MD  Calcium Carbonate-Vit D-Min (CALCIUM 1200 PO) Take 1,200 mg by mouth daily.    Yes Historical Provider, MD  Cholecalciferol (VITAMIN D-3) 5000 UNITS TABS Take 1 capsule by mouth daily.    Yes Historical Provider, MD  ibuprofen (ADVIL,MOTRIN) 200 MG tablet Take 400 mg by mouth daily as needed for headache (headache).    Yes Historical Provider, MD  metoprolol succinate (TOPROL-XL) 25 MG 24 hr tablet Take 25 mg by mouth at bedtime.   Yes Historical Provider, MD  Omega 3 1200 MG CAPS Take 1,200 mg by mouth at bedtime.    Yes Historical Provider, MD  OVER THE COUNTER MEDICATION Take 1 capsule by mouth daily. Digestive Enzyme Mixture.   Yes Historical Provider, MD  predniSONE (DELTASONE) 1 MG tablet Take 1 mg by mouth daily.    Yes Historical Provider, MD  Probiotic Product (PROBIOTIC DAILY PO) Take 1 capsule by  mouth daily.    Yes Historical Provider, MD  ranitidine (ZANTAC) 150 MG tablet Take 150-300 mg by mouth 2 (two) times daily. Take one tablet in morning and two in evening   Yes Historical Provider, MD   BP 135/67  Pulse 108  Temp(Src) 97.7 F (36.5 C) (Oral)  Resp 16  SpO2 92% Physical Exam  Constitutional: She is oriented to person, place, and time. She appears well-developed and well-nourished. No distress.  HENT:  Head: Normocephalic and atraumatic.  Mouth/Throat: No oropharyngeal exudate.  Eyes: Pupils are equal, round, and reactive to light.  Neck: Normal range of motion. Neck supple.  Cardiovascular: Normal rate, regular rhythm and normal heart sounds.  Exam reveals no gallop and no friction rub.   No murmur heard. Pulmonary/Chest: Effort normal. No respiratory distress. She has wheezes in the left upper field. She has no rales.  Abdominal: Soft. Bowel sounds are normal. She exhibits no distension and no mass. There is no tenderness. There is no rebound and no guarding.  Musculoskeletal: Normal range of motion. She exhibits no edema and no tenderness.  Neurological: She is alert and oriented to person, place, and time.  Skin: Skin is warm and dry.  Psychiatric: She has a normal mood and affect.    ED Course  Procedures (including critical care time) Labs Review Labs Reviewed - No data to display  Imaging Review Dg Chest 2 View  04/28/2014   CLINICAL DATA:  Pt. States she thinks she might have aspirated a spaghetti noodle this afternoon and ever since then has been SOB accompanied with a productive cough  EXAM: CHEST  2 VIEW  COMPARISON:  02/25/2014  FINDINGS: Small right pleural effusion. Bilateral chronic interstitial thickening. No focal consolidation or pneumothorax. There is stable cardiomegaly. There is a dual lead AICD.  Large hiatal hernia. Chronic esophageal dilatation with mottled material within the esophagus which may reflect ingested food material.  The osseous  structures are unremarkable.  IMPRESSION: 1. Small right pleural effusion.  No focal consolidation. 2. Large hiatal hernia. Chronic esophageal dilatation with mottled material within the esophagus which may reflect ingested food material.   Electronically Signed   By: Kathreen Devoid   On: 04/28/2014 18:08     EKG Interpretation None      MDM   Final diagnoses:  SOB (shortness of breath)  Choking episode    Pt  is a 80 y.o. female with Pmhx as above who presents with gradually improving shortness of breath after having a choking episode while eating spaghetti for lunch. She states this has happened numerous times in the past. She has a history of aspiration and an esophageal stricture. She had Botox injection about weeks ago. On physical exam patient is mildly tachycardic but otherwise vital signs are stable and she is in no acute distress. She has one isolated wheeze in the left upper lobe. Lungs are otherwise clear. O2 sats is normal. Chest x-ray shows small right pleural effusion but no focal consolidations. She is a large hiatal hernia. She also has ingested food in the esophagus. Patient used her own inhaler in the room and upon repeat exam she states that she feels her breathing is almost back to normal. She will be discharged home with instructions for close PCP followup as well as return precautions for new or worsening symptoms including worsening shortness of breath, worsening cough, fever.      Ernestina Patches, MD 04/28/14 [phone]

## 2014-04-28 NOTE — Discharge Instructions (Signed)
Choking °Choking occurs when a food or object gets stuck in the throat or trachea, blocking the airway. If the airway is partly blocked, coughing will usually cause the food or object to come out. If the airway is completely blocked, immediate action is needed to help it come out. A complete airway blockage is life threatening because it causes breathing to stop. Choking is a true medical emergency that requires fast, appropriate action by anyone available. °SIGNS OF AIRWAY BLOCKAGE °There is a partial airway blockage if you or the person who is choking is:  °· Able to breathe and speak. °· Coughing loudly. °· Making loud noises. °There is a complete airway blockage if you or the person who is choking is:  °· Unable to breathe. °· Making soft or high-pitched sounds while breathing. °· Unable to cough or coughing weakly, ineffectively, or silently. °· Unable to cry, speak, or make sounds. °· Turning blue. °· Holding the neck with both arms. This is the universal sign of choking. °WHAT TO DO IF CHOKING OCCURS °If there is a partial airway blockage, allow coughing to clear the airway. Do not try to drink until the food or object comes out. If someone else has a partial airway blockage, do not interfere. Stay with him or her and watch for signs of complete airway blockage until the food or object comes out.  °If there is a complete airway blockage or if there is a partial airway blockage and the food or object does not come out, perform abdominal thrusts (also referred to as the Heimlich maneuver). Abdominal thrusts are used to create an artificial cough to try to clear the airway. Performing abdominal thrusts is part of a series of steps that should be done to help someone who is choking. Abdominal thrusts are usually done by someone else, but if you are alone, you can perform abdominal thrusts on yourself. Follow the procedure below that best fits your situation.  °IF SOMEONE ELSE IS CHOKING: °For a conscious adult:    °1. Ask the person whether he or she is choking. If the person nods, continue to step 2. °2. Stand or kneel behind the person and lean him or her forward slightly. °3. Make a fist with 1 hand, put your arms around the person, and grasp your fist with your other hand. Place the thumb side of your fist in the person's abdomen, just below the ribs. °4. Press inward and upward with both hands. °5. Repeat this maneuver until the object comes out and the person is able to breathe or until the person loses consciousness. °For an unconscious adult: °1. Shout for help. If someone responds, have him or her call local emergency services (911 in [place] If no one responds, call local emergency services yourself if possible. °2. Begin CPR, starting with compressions. Every time you open the airway to give rescue breaths, open the person's mouth. If you can see the food or object and it can be easily pulled out, remove it with your fingers. °3. After 5 cycles or 2 minutes of CPR, call local emergency services (911 in [place] if you or someone else did not already call. °For a conscious adult who is obese or in the later stages of pregnancy: °Abdominal thrusts may not be effective when helping people who are in the later stages of pregnancy or who are obese. In these instances, chest thrusts can be used.  °1. Ask the person whether he or she is choking. If the person   nods and has signs of complete airway blockage, continue to step 2. °2. Stand behind the person and wrap your arms around his or her chest (with your arms under the person's armpits). °3. Make a fist with 1 hand. Place the thumb side of your fist on the middle of the person's breastbone. °4. Grab your fist with your other hand and thrust backward. Continue this until the object comes out or until the person becomes unconscious. °For an unconscious adult who is obese or in the later stages of pregnancy:  °1. Shout for help. If someone responds, have him or her call local  emergency services (911 in [place] If no one responds, call local emergency services yourself if possible. °2. Begin CPR, starting with compressions. Every time you open the airway to give rescue breaths, open the person's mouth. If you can see the food or object and it can be easily pulled out, remove it with your fingers. °3. After 5 cycles or 2 minutes of CPR, call local emergency services (911 in [place] if you or someone else did not already call. °Note that abdominal thrusts (below the rib cage) should be used for a pregnant woman if possible. This should be possible until the later stages of pregnancy when there is no longer enough room between the enlarging uterus and the rib cage to perform the maneuver. At that point, chest thrusts must be used as described. °IF YOU ARE CHOKING: °1. Call local emergency services (911 in [place] if near a landline. Do not worry about communicating what is happening. Do not hang up the phone. Someone may be sent to help you anyway. °2. Make a fist with 1 hand. Put the thumb side of the fist against your stomach, just above the belly button and well below the breastbone. If you are pregnant or obese, put your fist on your chest instead, just below the breastbone and just above your lowest ribs. °3. Hold your fist with your other hand and bend over a hard surface, such as a table or chair. °4. Forcefully push your fist in and up. °5. Continue to do this until the food or object comes out. °PREVENTION  °To be prepared if choking occurs, learn how to correctly perform abdominal thrusts and give CPR by taking a certified first-aid training course.  °SEEK IMMEDIATE MEDICAL CARE IF: °· You have a fever after choking stops. °· You have problems breathing after choking stops. °· You received the Heimlich maneuver. °MAKE SURE YOU: °· Understand these instructions. °· Watch your condition. °· Get help right away if you are not doing well or get worse. °Document Released: 08/05/2004 Document  Revised: 11/12/2013 Document Reviewed: 02/08/2012 °ExitCare® Patient Information ©2015 [employer]. This information is not intended to replace advice given to you by your health care provider. Make sure you discuss any questions you have with your health care provider. ° °

## 2014-04-30 ENCOUNTER — Telehealth: Payer: Self-pay | Admitting: *Deleted

## 2014-04-30 NOTE — Telephone Encounter (Signed)
She lives at [facility]. Put her on the schedule there 05/14/14.

## 2014-04-30 NOTE — Telephone Encounter (Signed)
LMOM to return call.

## 2014-04-30 NOTE — Telephone Encounter (Signed)
Patient called and stated that the hospital told her to call you today and let you know she was in the hospital Sunday due to choking and SOB. There are no available Hospital Follow up appointment for a couple of weeks. Patient stated that she was feeling better and just wanted to let you know.

## 2014-05-02 NOTE — Telephone Encounter (Signed)
Patient scheduled for [facility] 05/14/14 at 9:15. Patient agreed.

## 2014-05-02 NOTE — Telephone Encounter (Signed)
Given to [place] to add into schedule. I could not edit schedule.

## 2014-05-14 ENCOUNTER — Non-Acute Institutional Stay: Payer: Medicare Other | Admitting: Internal Medicine

## 2014-05-14 ENCOUNTER — Encounter: Payer: Self-pay | Admitting: Internal Medicine

## 2014-05-14 ENCOUNTER — Ambulatory Visit: Payer: Medicare Other | Admitting: Internal Medicine

## 2014-05-14 VITALS — BP 116/60 | HR 81 | Temp 97.5°F | Wt 144.0 lb

## 2014-05-14 DIAGNOSIS — K220 Achalasia of cardia: Secondary | ICD-10-CM | POA: Diagnosis not present

## 2014-05-14 NOTE — Progress Notes (Signed)
Patient ID: [name], female   DOB: [birth year], 80 y.o.   MRN: [id number]    [facility]     Place of Service: Clinic (12)    Allergies  Allergen Reactions  . Actonel [Risedronate Sodium] Other (See Comments)    Joint aches; rechallenged --caused joint aches  . Ivp Dye [Iodinated Diagnostic Agents] Nausea And Vomiting    Chief Complaint  Patient presents with  . Hospitalization Follow-up    Went to ER 04/28/14 for sudden episode of choking and SOB    HPI:  Known achalasia. Had choking episode and "could not breathe: when she went to the ER. Has not had any further incidents. Has seen Dr. Deatra Ina who did Botox injection. This gives her temporary relief from the sensation of food hanging in mid chest.  Medications: Patient's Medications  New Prescriptions   No medications on file  Previous Medications   ACETAMINOPHEN (TYLENOL) 325 MG TABLET    Take 650 mg by mouth every 6 (six) hours as needed for moderate pain (arthritis pain).    ASCORBIC ACID (VITAMIN C) 500 MG TABLET    Take 500 mg by mouth daily.    B COMPLEX VITAMINS CAPSULE    Take 1 capsule by mouth daily.   CALCIUM CARBONATE-VIT D-MIN (CALCIUM 1200 PO)    Take 1,200 mg by mouth daily.    CHOLECALCIFEROL (VITAMIN D-3) 5000 UNITS TABS    Take 1 capsule by mouth daily.    IBUPROFEN (ADVIL,MOTRIN) 200 MG TABLET    Take 400 mg by mouth daily as needed for headache (headache).    METOPROLOL SUCCINATE (TOPROL-XL) 25 MG 24 HR TABLET    Take 25 mg by mouth at bedtime.   OMEGA 3 1200 MG CAPS    Take 1,200 mg by mouth at bedtime.    OVER THE COUNTER MEDICATION    Take 1 capsule by mouth daily. Digestive Enzyme Mixture.   PREDNISONE (DELTASONE) 1 MG TABLET    Take 1 mg by mouth daily.    PROBIOTIC PRODUCT (PROBIOTIC DAILY PO)    Take 1 capsule by mouth daily.    RANITIDINE (ZANTAC) 150 MG TABLET    Take 150-300 mg by mouth 2 (two) times daily. Take one tablet in morning and two in evening  Modified Medications    No medications on file  Discontinued Medications   No medications on file     Review of Systems  Constitutional: Negative for fever, activity change, appetite change and fatigue.       Mildly overweight.  Eyes: Negative.   Respiratory:       Aspiration pneumonia December 6283 complicated by subsequent empyema. Appears fully recovered as of 07/17/13.  Cardiovascular: Negative for chest pain, palpitations and leg swelling.  Gastrointestinal:       Known achalasia and megaesophagus. Some dysphagia.   Endocrine: Negative.   Genitourinary:       Chronic urinary leakage. Wears protective pads for at least 10 years. She has seen urologist in the past. Sling surgery was proposed, but she did not want to go through this.  Musculoskeletal: Positive for myalgias. Negative for back pain and gait problem.       Diffuse muscular pains, worse in the shoulders.  Skin: Negative.   Allergic/Immunologic: Negative.   Neurological: Negative.   Hematological:       History of anemia.  Psychiatric/Behavioral: Negative.     Filed Vitals:   05/14/14 0940  BP: 116/60  Pulse: 81  Temp: 97.5 F (36.4 C)  TempSrc: Oral  Weight: 144 lb (65.318 kg)  SpO2: 96%   Body mass index is 27.9 kg/(m^2).  Physical Exam  Constitutional: She is oriented to person, place, and time. She appears well-developed and well-nourished. No distress.  HENT:  Head: Normocephalic and atraumatic.  Right Ear: External ear normal.  Left Ear: External ear normal.  Nose: Nose normal.  Mouth/Throat: Oropharynx is clear and moist.  Eyes:  Corrective lenses worn.  Neck: Thyromegaly present.  Multinodular goiter with nodules palpable in all 4 lobes. Cyst in the right upper lobe of the thyroid is particularly large.  Cardiovascular: Normal rate, regular rhythm and intact distal pulses.  Exam reveals friction rub. Exam reveals no gallop.   No murmur heard. Pulmonary/Chest: Effort normal and breath sounds normal. No respiratory  distress. She has no wheezes. She has no rales. She exhibits no tenderness.  Abdominal: Soft. Bowel sounds are normal. She exhibits no distension and no mass. There is no tenderness.  Musculoskeletal: Normal range of motion. She exhibits no edema or tenderness.  Left leg is shorter than the right. Left 2nd and 4th hammer toes. Left 3rd toe had corrective surgery for hammer toe.  Neurological: She is alert and oriented to person, place, and time. She has normal reflexes. No cranial nerve deficit. Coordination normal.  Skin: No rash noted. No erythema.  Psychiatric: She has a normal mood and affect. Her behavior is normal. Judgment and thought content normal.     Labs reviewed: Nursing Home on 04/02/2014  Component Date Value Ref Range Status  . Glucose 03/25/2014 87   Final  . BUN 03/25/2014 17  4 - 21 mg/dL Final  . Creatinine 03/25/2014 0.6  0.5 - 1.1 mg/dL Final  . Potassium 03/25/2014 3.7  3.4 - 5.3 mmol/L Final  . Sodium 03/25/2014 141  137 - 147 mmol/L Final  . TSH 03/25/2014 1.43  0.41 - 5.90 uIU/mL Final     Assessment/Plan  1. Achalasia of esophagus Continue current medications. Continue to consult with Dr. Deatra Ina.

## 2014-07-20 ENCOUNTER — Other Ambulatory Visit: Payer: Self-pay | Admitting: Internal Medicine

## 2014-08-28 ENCOUNTER — Telehealth: Payer: Self-pay

## 2014-08-28 NOTE — Telephone Encounter (Signed)
Message left on voicemail- please return call.  I called patient, patient needs help detaching CPAP mask and attaching her new mask, Patient nor her husband is able to do this.  I called Milus Banister [facility] Nurse for [facility]) and left message on voicemail for her follow-up with the patient/resident.   Patient aware I contacted Milus Banister

## 2014-09-10 DIAGNOSIS — Z950 Presence of cardiac pacemaker: Secondary | ICD-10-CM | POA: Diagnosis not present

## 2014-09-23 ENCOUNTER — Encounter: Payer: Self-pay | Admitting: Internal Medicine

## 2014-09-23 ENCOUNTER — Ambulatory Visit (INDEPENDENT_AMBULATORY_CARE_PROVIDER_SITE_OTHER): Payer: Medicare Other | Admitting: Internal Medicine

## 2014-09-23 VITALS — BP 130/70 | HR 71 | Ht 62.0 in | Wt 145.8 lb

## 2014-09-23 DIAGNOSIS — Z45018 Encounter for adjustment and management of other part of cardiac pacemaker: Secondary | ICD-10-CM

## 2014-09-23 DIAGNOSIS — I442 Atrioventricular block, complete: Secondary | ICD-10-CM

## 2014-09-23 DIAGNOSIS — M353 Polymyalgia rheumatica: Secondary | ICD-10-CM

## 2014-09-23 DIAGNOSIS — I10 Essential (primary) hypertension: Secondary | ICD-10-CM | POA: Diagnosis not present

## 2014-09-23 DIAGNOSIS — R1900 Intra-abdominal and pelvic swelling, mass and lump, unspecified site: Secondary | ICD-10-CM | POA: Diagnosis not present

## 2014-09-23 DIAGNOSIS — E042 Nontoxic multinodular goiter: Secondary | ICD-10-CM | POA: Diagnosis not present

## 2014-09-23 DIAGNOSIS — R7309 Other abnormal glucose: Secondary | ICD-10-CM | POA: Diagnosis not present

## 2014-09-23 LAB — MDC_IDC_ENUM_SESS_TYPE_INCLINIC
Battery Impedance: 1266 Ohm
Battery Remaining Longevity: 57 mo
Battery Voltage: 2.77 V
Brady Statistic AP VP Percent: 2 %
Brady Statistic AP VS Percent: 20 %
Brady Statistic AS VP Percent: 0 %
Brady Statistic AS VS Percent: 78 %
Date Time Interrogation Session: 20160314112946
Lead Channel Impedance Value: 482 Ohm
Lead Channel Impedance Value: 587 Ohm
Lead Channel Pacing Threshold Amplitude: 0.5 V
Lead Channel Pacing Threshold Amplitude: 0.5 V
Lead Channel Pacing Threshold Pulse Width: 0.4 ms
Lead Channel Pacing Threshold Pulse Width: 0.46 ms
Lead Channel Sensing Intrinsic Amplitude: 11.2 mV
Lead Channel Sensing Intrinsic Amplitude: 2.8 mV
Lead Channel Setting Pacing Amplitude: 2 V
Lead Channel Setting Pacing Amplitude: 2.5 V
Lead Channel Setting Pacing Pulse Width: 0.46 ms
Lead Channel Setting Sensing Sensitivity: 4 mV

## 2014-09-23 LAB — HEPATIC FUNCTION PANEL
ALT: 9 U/L (ref 7–35)
AST: 16 U/L (ref 13–35)
Alkaline Phosphatase: 83 U/L (ref 25–125)
BILIRUBIN, TOTAL: 0.5 mg/dL

## 2014-09-23 LAB — BASIC METABOLIC PANEL
BUN: 15 mg/dL (ref 4–21)
CREATININE: 0.7 mg/dL (ref 0.5–1.1)
Glucose: 89 mg/dL
Potassium: 3.9 mmol/L (ref 3.4–5.3)
SODIUM: 141 mmol/L (ref 137–147)

## 2014-09-23 LAB — HEMOGLOBIN A1C: HEMOGLOBIN A1C: 5.5 % (ref 4.0–6.0)

## 2014-09-23 LAB — TSH: TSH: 1.22 u[IU]/mL (ref 0.41–5.90)

## 2014-09-23 NOTE — Patient Instructions (Addendum)
Your physician has recommended you make the following change in your medication:  1) STOP Metoprolol  Your physician has requested that you have an abdominal ultrasound.  Your physician wants you to follow-up in: 1 year with Dr. Caryl Comes.  You will receive a reminder letter in the mail two months in advance. If you don't receive a letter, please call our office to schedule the follow-up appointment.

## 2014-09-23 NOTE — Progress Notes (Signed)
Last wound and she didn't    ELECTROPHYSIOLOGY CONSULT NOTE  Patient ID: [name], MRN: [id number], DOB/AGE: 16-Oct-1928 81 y.o. Admit date: (Not on file) Date of Consult: 09/23/2014  Primary Physician: Estill Dooms, MD Primary Cardiologist: new SK  Chief Complaint: to establish   HPI [name] is a 81 y.o. female  Seen to establish pacemaker followup  She has a history of complete heart block and prior syncope. She underwent pacemaker implantation originally 06/1999 and underwent device generator replacement 5/09.  It isMedtronic device with [id number] leads.  Multiple pages of her records from [place] were reviewed;  demonstrated were normal last checked 2013 no echocardiogram or LV function assessment is available. Multiple records from [facility] were also reviewed. She underwent VATS persistent pleural effusion. This turned out to be  Empyema. It was thought perhaps secondary to aspiration pneumonia occurring a consequence of dysmotility  She also has a history of polymyalgia is on chronic prednisone.   She is her husband worked at [employer]. They have 9 children and 9 grandchildren  Past Medical History  Diagnosis Date  . GERD (gastroesophageal reflux disease)   . Empyema, right PULMOLOGIST-- DR CLANCE    VATS 06/23/2012 cultures negative to date CXR 07/19/12 persistent airfluid levels/  CXR 11-01-2012 IMPROVE RIGHT PLEURAL EFFUSION  . Polymyalgia rheumatica     on chronic Prednisone 5mg  daily  . Arthritis     back  . Hypertension   . OSA on CPAP   . Intrinsic urethral sphincter deficiency   . Megaesophagus   . Multiple thyroid nodules     BILATERAL --  LARGE RIGHT UPPER LOBE (MONITORED BY PCP)  . RBBB   . Chronic steroid use   . History of aspiration pneumonitis     DEC 2013  . Mixed stress and urge urinary incontinence   . Pacemaker MEDTRONIC--  LAST PACER CHECK 05-09-2013  PER PT CHECK OUT OK    CARIOLOGIST--  DR RUTH GREENFIELD AT [facility]--  PT HAS HAD NORMAL STRESS  TEST AND ECHO   . Complete heart block     S/P PACEMAKER 2000 W/ GENERATOR CHANGE 2009  . Normal cardiac stress test     PER PT AT [facility]  . H/O echocardiogram     NORMAL PER PT DONE AT [facility]  . S/P dilatation of esophageal stricture   . Complete heart block       Surgical History:  Past Surgical History  Procedure Laterality Date  . Video bronchoscopy  06/23/2012    Procedure: VIDEO BRONCHOSCOPY;  Surgeon: Grace Isaac, MD;  Location: [place];  Service: Thoracic;  Laterality: N/A;  . Video assisted thoracoscopy (vats)/decortication  06/23/2012    Procedure: VIDEO ASSISTED THORACOSCOPY (VATS)/DECORTICATION;  Surgeon: Grace Isaac, MD;  Location: [place];  Service: Thoracic;  Laterality: Right;  . Esophagogastroduodenoscopy (egd) with esophageal dilation  06/27/2012    Procedure: ESOPHAGOGASTRODUODENOSCOPY (EGD) WITH ESOPHAGEAL DILATION;  Surgeon: Ladene Artist, MD,FACG;  Location: [facility];  Service: Endoscopy;  Laterality: N/A;  . Video assisted thoracoscopy (vats)/empyema      06/23/2012  . Esophagogastroduodenoscopy  07/24/2012    Procedure: ESOPHAGOGASTRODUODENOSCOPY (EGD);  Surgeon: Inda Castle, MD;  Location: [facility];  Service: Endoscopy;  Laterality: N/A;  . Balloon dilation  07/24/2012    Procedure: BALLOON DILATION;  Surgeon: Inda Castle, MD;  Location: [facility];  Service: Endoscopy;  Laterality: N/A;  . Esophagogastroduodenoscopy  08/07/2012    Procedure: ESOPHAGOGASTRODUODENOSCOPY (EGD);  Surgeon: Herbie Baltimore  Shaaron Adler, MD;  Location: [facility];  Service: Endoscopy;  Laterality: N/A;  . Botox injection  08/07/2012    Procedure: BOTOX INJECTION;  Surgeon: Inda Castle, MD;  Location: [facility];  Service: Endoscopy;  Laterality: N/A;  . Toe surgery  2013    left 3rd toe HAMMERTOE REPAIR  . Total hip arthroplasty Right 2001  . Tonsillectomy  AS CHILD  . Cataract extraction w/ intraocular lens  implant, bilateral  2005  . Vericose vein ligation    .  Appendectomy  1953    w/ removal benign kidney tumor   . Botox injection N/A 05/24/2013    Procedure: MACROPLASTIQUE IMPLANT;  Surgeon: Irine Seal, MD;  [facility];  Service: Urology;  Laterality: N/A;  . Joint replacement      right hip, 2001  . Esophagogastroduodenoscopy N/A 02/25/2014    Procedure: ESOPHAGOGASTRODUODENOSCOPY (EGD);  Surgeon: Inda Castle, MD;  Location: [facility];  Service: Endoscopy;  Laterality: N/A;  . Botox injection  02/25/2014    Procedure: BOTOX INJECTION;  Surgeon: Inda Castle, MD;  Location: [facility];  Service: Endoscopy;;  . Pacemaker generator change  12/13/2007    at [facility]  . Cardiac pacemaker placement  06/1999  DR RUTH GREENFIELD AT [facility]     MEDTRONIC  ( LAST PACER CHECK 05-09-2013) for CHB/   END-OF-LIFE GENERATOR CHANGE  2009     Home Meds: Prior to Admission medications   Medication Sig Start Date End Date Taking? Authorizing Provider  acetaminophen (TYLENOL) 325 MG tablet Take 650 mg by mouth every 6 (six) hours as needed for moderate pain (arthritis pain).     Historical Provider, MD  ascorbic acid (VITAMIN C) 500 MG tablet Take 500 mg by mouth daily.     Historical Provider, MD  b complex vitamins capsule Take 1 capsule by mouth daily.    Historical Provider, MD  Calcium Carbonate-Vit D-Min (CALCIUM 1200 PO) Take 1,200 mg by mouth daily.     Historical Provider, MD  Cholecalciferol (VITAMIN D-3) 5000 UNITS TABS Take 1 capsule by mouth daily.     Historical Provider, MD  ibuprofen (ADVIL,MOTRIN) 200 MG tablet Take 400 mg by mouth daily as needed for headache (headache).     Historical Provider, MD  metoprolol succinate (TOPROL-XL) 25 MG 24 hr tablet Take 25 mg by mouth at bedtime.    Historical Provider, MD  metoprolol succinate (TOPROL-XL) 25 MG 24 hr tablet TAKE 1 TABLET DAILY TO CONTROL BLOOD PRESSURE AND HEART RHYTHM 07/22/14   Tiffany L Reed, DO  Omega 3 1200 MG CAPS Take 1,200 mg by mouth at bedtime.      Historical Provider, MD  OVER THE COUNTER MEDICATION Take 1 capsule by mouth daily. Digestive Enzyme Mixture.    Historical Provider, MD  predniSONE (DELTASONE) 1 MG tablet Take 1 mg by mouth daily.     Historical Provider, MD  Probiotic Product (PROBIOTIC DAILY PO) Take 1 capsule by mouth daily.     Historical Provider, MD  ranitidine (ZANTAC) 150 MG tablet Take 150-300 mg by mouth 2 (two) times daily. Take one tablet in morning and two in evening    Historical Provider, MD      Allergies:  Allergies  Allergen Reactions  . Actonel [Risedronate Sodium] Other (See Comments)    Joint aches; rechallenged --caused joint aches  . Ivp Dye [Iodinated Diagnostic Agents] Nausea And Vomiting    History   Social History  . Marital Status:  Married    Spouse Name: N/A  . Number of Children: 3  . Years of Education: N/A   Occupational History  . Retired    Social History Main Topics  . Smoking status: Never Smoker   . Smokeless tobacco: Never Used  . Alcohol Use: 1.8 oz/week    3 Glasses of wine per week  . Drug Use: No  . Sexual Activity: No     Comment: married   Other Topics Concern  . Not on file   Social History Narrative   Lives at [facility] since 05/18/2012   Married   Pacemaker   Living Will   Exercise - exercise classes 5 days a week    Whole Body Donation at [facility]     Family History  Problem Relation Age of Onset  . Arthritis Father   . Heart disease Father   . Cancer Brother     ?spine  . Heart disease Brother   . Diabetes Son   . Heart disease Mother     Congestive heart failure  . Heart disease Brother   . Arthritis Brother   . Hypertension Brother   . Colon cancer Neg Hx      ROS:  Please see the history of present illness.     All other systems reviewed and negative.    Physical Exam: Blood pressure 130/70, pulse 71, height 5\' 2"  (1.575 m), weight 145 lb 12.8 oz (66.134 kg). General: Well developed, well nourished  female in no acute distress. Head: Normocephalic, atraumatic, sclera non-icteric, no xanthomas, nares are without discharge. EENT: normal Lymph Nodes:  none Back: with kyphosis, no CVA tendersness Neck: Negative for carotid bruits. JVD not elevated. Lungs: Clear bilaterally to auscultation without wheezes, rales, or rhonchi. Breathing is unlabored. Heart: RRR with S1 S2. 2 /6 systolic murmur , rubs, or gallops appreciated. Abdomen: Soft, non-tender, non-distended with normoactive bowel sounds. No hepatomegaly. No rebound/guarding. Midline pulsation present Msk:  Strength and tone appear normal for age. Extremities: No clubbing or cyanosis. No  edema.  Distal pedal pulses are 2+ and equal bilaterally. Skin: Warm and Dry Neuro: Alert and oriented X 3. CN III-XII intact Grossly normal sensory and motor function . Psych:  Responds to questions appropriately with a normal affect.      Labs: Cardiac Enzymes No results for input(s): CKTOTAL, CKMB, TROPONINI in the last 72 hours. CBC Lab Results  Component Value Date   WBC 6.9 11/05/2013   HGB 12.1 11/05/2013   HCT 36 11/05/2013   MCV 93.6 07/29/2012   PLT 151 11/05/2013   PROTIME: No results for input(s): LABPROT, INR in the last 72 hours. Chemistry No results for input(s): NA, K, CL, CO2, BUN, CREATININE, CALCIUM, PROT, BILITOT, ALKPHOS, ALT, AST, GLUCOSE in the last 168 hours.  Invalid input(s): LABALBU Lipids No results found for: CHOL, HDL, LDLCALC, TRIG BNP PRO B NATRIURETIC PEPTIDE (BNP)  Date/Time Value Ref Range Status  07/22/2012 12:30 PM 143.1 0 - 450 pg/mL Final  06/21/2012 06:20 PM 222.6 0 - 450 pg/mL Final   Miscellaneous Lab Results  Component Value Date   DDIMER 6.25* 07/22/2012    Radiology/Studies:  No results found.  EKG: Was ordered today demonstrated sinus rhythm at 71 Intervals 23/14/42 a right bundle branch block with borderline right axis deviation   Assessment and Plan:   Intermittent complete  heart block  Sinus node dysfunction  Pacemaker-Medtronic  Bifascicular block-right bundle left posterior fascicular block  History of aspiration pneumonia  Polymyalgia rheumatica on prednisone  Pulsating abdominal mass  Metoprolol therapy question indication  From a cardiac point of view Mrs. Chimenti is doing well. She has no functional limitations. Device function is normal. She recalls a Myoview scan about 5 years ago which was apparently normal.  She is a pulsating abdominal mass; her abdominal musculature is quite flaccid. We will check an ultrasound.  We will plan to stop her metoprolol.  [facility].  Virl Axe

## 2014-09-27 ENCOUNTER — Ambulatory Visit (HOSPITAL_COMMUNITY)
Admission: RE | Admit: 2014-09-27 | Discharge: 2014-09-27 | Disposition: A | Discharge status: Home / Self Care | Payer: Medicare Other | Source: Ambulatory Visit | Attending: Internal Medicine | Admitting: Internal Medicine

## 2014-09-27 ENCOUNTER — Telehealth: Payer: Self-pay

## 2014-09-27 DIAGNOSIS — R1900 Intra-abdominal and pelvic swelling, mass and lump, unspecified site: Secondary | ICD-10-CM

## 2014-09-27 DIAGNOSIS — R1902 Left upper quadrant abdominal swelling, mass and lump: Secondary | ICD-10-CM

## 2014-09-27 NOTE — Telephone Encounter (Signed)
[name] at [facility] needed clarification on exactly what Dr. Caryl Comes needs abdominal ultrasound for. Progress note from 09/23/14 stated "She is a pulsating abdominal mass; her abdominal musculature is quite flaccid. We will check an ultrasound."  Informed [name] that I think he is looking for an abdomenal aneurysm, but would clarify. In the mean time ultrasound will need to be reschedule due to patient not being NPO for procedure. Will forward to Dr. Caryl Comes and Trinidad Curet.

## 2014-09-30 NOTE — Telephone Encounter (Signed)
i thnk the indication is for pulsatile mass to exclude AAA Thanks

## 2014-10-01 ENCOUNTER — Other Ambulatory Visit: Payer: Self-pay | Admitting: *Deleted

## 2014-10-01 ENCOUNTER — Encounter: Payer: Self-pay | Admitting: Internal Medicine

## 2014-10-01 DIAGNOSIS — R1900 Intra-abdominal and pelvic swelling, mass and lump, unspecified site: Secondary | ICD-10-CM

## 2014-10-01 NOTE — Addendum Note (Signed)
Addended by: Stanton Kidney on: 10/01/2014 08:48 AM   Modules accepted: Orders

## 2014-10-01 NOTE — Telephone Encounter (Addendum)
Informed [name] in ultrasound that test indication is for pulsatile mass to exclude AAA. Asked to place correct order in system and reschedule test. [facility] also asked that patient be informed to be NPO 6 hours prior to test, but if early morning test then NPO after midnight. Advised we will inform patient of instructions.  (Will have [facility] advise patient of this when they call to reschedule this test)

## 2014-10-03 ENCOUNTER — Telehealth: Payer: Self-pay | Admitting: Internal Medicine

## 2014-10-03 NOTE — Telephone Encounter (Signed)
New Msg ° ° ° ° ° ° ° ° °Pt returning call from today.  ° ° °Please call back. °

## 2014-10-03 NOTE — Telephone Encounter (Signed)
Informed patient that ultrasound rescheduled for 4/6 in our office at 10:00 a.m. Patient verbalized understanding and agreeable to plan.

## 2014-10-08 ENCOUNTER — Non-Acute Institutional Stay: Payer: Medicare Other | Admitting: Internal Medicine

## 2014-10-08 ENCOUNTER — Encounter: Payer: Self-pay | Admitting: Internal Medicine

## 2014-10-08 VITALS — BP 100/62 | HR 80 | Temp 97.5°F | Wt 142.0 lb

## 2014-10-08 DIAGNOSIS — R609 Edema, unspecified: Secondary | ICD-10-CM | POA: Diagnosis not present

## 2014-10-08 DIAGNOSIS — R739 Hyperglycemia, unspecified: Secondary | ICD-10-CM | POA: Diagnosis not present

## 2014-10-08 DIAGNOSIS — G4730 Sleep apnea, unspecified: Secondary | ICD-10-CM

## 2014-10-08 DIAGNOSIS — E042 Nontoxic multinodular goiter: Secondary | ICD-10-CM | POA: Diagnosis not present

## 2014-10-08 DIAGNOSIS — K220 Achalasia of cardia: Secondary | ICD-10-CM | POA: Diagnosis not present

## 2014-10-08 DIAGNOSIS — E669 Obesity, unspecified: Secondary | ICD-10-CM | POA: Diagnosis not present

## 2014-10-08 DIAGNOSIS — I10 Essential (primary) hypertension: Secondary | ICD-10-CM | POA: Diagnosis not present

## 2014-10-08 DIAGNOSIS — K2100 Gastro-esophageal reflux disease with esophagitis, without bleeding: Secondary | ICD-10-CM

## 2014-10-08 DIAGNOSIS — M353 Polymyalgia rheumatica: Secondary | ICD-10-CM | POA: Diagnosis not present

## 2014-10-08 NOTE — Progress Notes (Signed)
Patient ID: [name], female   DOB: Aug 19, 1928, 81 y.o.   MRN: [id number]    [facility]     Place of Service: Clinic (12)    Allergies  Allergen Reactions  . Actonel [Risedronate Sodium] Other (See Comments)    Joint aches; rechallenged --caused joint aches  . Ivp Dye [Iodinated Diagnostic Agents] Nausea And Vomiting    Chief Complaint  Patient presents with  . Medical Management of Chronic Issues    blood pressure, blood sugar, thyroid    HPI:  Essential hypertension: Controlled  Multinodular thyroid: Unchanged. TSH normal  Achalasia of esophagus: Continues with some dysphagia and heartburn.  Edema: Approximately 1+ bipedal and unchanged.  Hyperglycemia: Controlled  Obesity: Unchanged  Sleep apnea: Uses CPAP regularly  Polymyalgia rheumatica: Has reduced prednisone to 0.5 mg daily. Has some discomfort. She thinks she can tolerate this dose, but does not feel she can go any lower.  Reflux esophagitis: Occasional and related to achalasia of the esophagus.    Medications: Patient's Medications  New Prescriptions   No medications on file  Previous Medications   ACETAMINOPHEN (TYLENOL) 325 MG TABLET    Take 650 mg by mouth every 6 (six) hours as needed for moderate pain (arthritis pain).    ASCORBIC ACID (VITAMIN C) 500 MG TABLET    Take 500 mg by mouth daily.    B COMPLEX VITAMINS CAPSULE    Take 1 capsule by mouth daily.   CALCIUM CARBONATE-VIT D-MIN (CALCIUM 1200 PO)    Take 1,200 mg by mouth daily.    CHOLECALCIFEROL (VITAMIN D-3) 5000 UNITS TABS    Take 1 capsule by mouth daily.    IBUPROFEN (ADVIL,MOTRIN) 200 MG TABLET    Take 400 mg by mouth daily as needed for headache (headache).    OMEGA 3 1200 MG CAPS    Take 1,200 mg by mouth at bedtime.    PREDNISONE (DELTASONE) 1 MG TABLET    Take 1 mg by mouth. Take 0.5mg  daily   PROBIOTIC PRODUCT (PROBIOTIC DAILY PO)    Take 1 capsule by mouth daily.    RANITIDINE (ZANTAC) 150 MG TABLET     Take 150-300 mg by mouth 2 (two) times daily. Take one tablet in morning and two in evening   VITAMIN A 8000 UNIT CAPSULE    Take 8,000 Units by mouth daily.  Modified Medications   No medications on file  Discontinued Medications   No medications on file     Review of Systems  Constitutional: Negative for fever, activity change, appetite change and fatigue.       Mildly overweight.  Eyes: Negative.   Respiratory:       Aspiration pneumonia December 1017 complicated by subsequent empyema. Appears fully recovered as of 07/17/13.  Cardiovascular: Negative for chest pain, palpitations and leg swelling.  Gastrointestinal:       Known achalasia and megaesophagus. Some dysphagia.   Endocrine: Negative.   Genitourinary:       Chronic urinary leakage. Wears protective pads for at least 10 years. She has seen urologist in the past. Sling surgery was proposed, but she did not want to go through this.  Musculoskeletal: Positive for myalgias. Negative for back pain and gait problem.       Diffuse muscular pains, worse in the shoulders.  Skin: Negative.   Allergic/Immunologic: Negative.   Neurological: Negative.   Hematological:       History of anemia.  Psychiatric/Behavioral: Negative.  Filed Vitals:   10/08/14 1007  BP: 100/62  Pulse: 80  Temp: 97.5 F (36.4 C)  TempSrc: Oral  Weight: 142 lb (64.411 kg)  SpO2: 98%   Body mass index is 25.97 kg/(m^2).  Physical Exam  Constitutional: She is oriented to person, place, and time. She appears well-developed and well-nourished. No distress.  HENT:  Head: Normocephalic and atraumatic.  Right Ear: External ear normal.  Left Ear: External ear normal.  Nose: Nose normal.  Mouth/Throat: Oropharynx is clear and moist.  Eyes:  Corrective lenses worn.  Neck: Thyromegaly present.  Multinodular goiter with nodules palpable in all 4 lobes. Cyst in the right upper lobe of the thyroid is particularly large.  Cardiovascular: Normal rate,  regular rhythm and intact distal pulses.  Exam reveals friction rub. Exam reveals no gallop.   No murmur heard. Pulmonary/Chest: Effort normal and breath sounds normal. No respiratory distress. She has no wheezes. She has no rales. She exhibits no tenderness.  Abdominal: Soft. Bowel sounds are normal. She exhibits no distension and no mass. There is no tenderness.  Musculoskeletal: Normal range of motion. She exhibits no edema or tenderness.  Left leg is shorter than the right. Left 2nd and 4th hammer toes. Left 3rd toe had corrective surgery for hammer toe.  Neurological: She is alert and oriented to person, place, and time. She has normal reflexes. No cranial nerve deficit. Coordination normal.  Skin: No rash noted. No erythema.  Psychiatric: She has a normal mood and affect. Her behavior is normal. Judgment and thought content normal.     Labs reviewed: Nursing Home on 10/08/2014  Component Date Value Ref Range Status  . Glucose 09/23/2014 89   Final  . BUN 09/23/2014 15  4 - 21 mg/dL Final  . Creatinine 09/23/2014 0.7  0.5 - 1.1 mg/dL Final  . Potassium 09/23/2014 3.9  3.4 - 5.3 mmol/L Final  . Sodium 09/23/2014 141  137 - 147 mmol/L Final  . Alkaline Phosphatase 09/23/2014 83  25 - 125 U/L Final  . ALT 09/23/2014 9  7 - 35 U/L Final  . AST 09/23/2014 16  13 - 35 U/L Final  . Bilirubin, Total 09/23/2014 0.5   Final  . Hgb A1c MFr Bld 09/23/2014 5.5  4.0 - 6.0 % Final  . TSH 09/23/2014 1.22  0.41 - 5.90 uIU/mL Final  Office Visit on 09/23/2014  Component Date Value Ref Range Status  . Date Time Interrogation Session 09/23/2014 [id number]   Final  . Pulse Generator Manufacturer 09/23/2014 Medtronic   Final  . Pulse Gen Model 09/23/2014 ADDR01 Adapta   Final  . Pulse Gen Serial Number 09/23/2014 [id number]   Final  . RV Sense Sensitivity 09/23/2014 4   Final  . RA Pace Amplitude 09/23/2014 2   Final  . RV Pace PulseWidth 09/23/2014 0.46   Final  . RV Pace Amplitude 09/23/2014  2.5   Final  . RA Impedance 09/23/2014 482   Final  . RA Amplitude 09/23/2014 2.8   Final  . RA Pacing Amplitude 09/23/2014 0.5   Final  . RA Pacing PulseWidth 09/23/2014 0.4   Final  . RV IMPEDANCE 09/23/2014 587   Final  . RV Amplitude 09/23/2014 11.2   Final  . RV Pacing Amplitude 09/23/2014 0.5   Final  . RV Pacing PulseWidth 09/23/2014 0.46   Final  . Battery Status 09/23/2014 Unknown   Final  . Battery Longevity 09/23/2014 57   Final  . Battery Voltage 09/23/2014 2.77  Final  . Battery Impedance 09/23/2014 1266   Final  . Loletha Grayer AP VP Percent 09/23/2014 2   Final  . Loletha Grayer AS VP Percent 09/23/2014 0   Final  . Loletha Grayer AP VS Percent 09/23/2014 20   Final  . Loletha Grayer AS VS Percent 09/23/2014 78   Final  . Eval Rhythm 09/23/2014 SR 72   Final  . Miscellaneous Comment 09/23/2014    Final                   Value:Pacemaker check in clinic. Normal device function. Thresholds, sensing, impedances consistent with previous measurements. Device programmed to maximize longevity. 6 mode switches--longest was 47 minutes 5 seconds. AF burden < 0.1% of time. No  anticoagulation. 1 high ventricular rate noted lasting 10 beats. Device programmed at appropriate safety margins--changed to chronic outputs. Histogram distribution appropriate for patient activity level. Device programmed to optimize intrinsic  conduction. Estimated longevity 4.5 years. Patient enrolled in remote follow-up and transferring to our clinic. Ordering WireX for pt. [facility] 12-23-14 and ROV in 12 mths w/SK.      Assessment/Plan   1. Essential hypertension Controlled  2. Multinodular thyroid Unchanged  3. Achalasia of esophagus Unchanged  4. Edema 1+ bipedal  5. Hyperglycemia Stable and controlled hemoglobin A1c 6.3  6. Obesity Unchanged. Encouraged weight loss  7. Sleep apnea Continue CPAP  8. Polymyalgia rheumatica Continue prednisone 0.5 mg daily  9. Reflux esophagitis Continue ranitidine 150 mg twice  daily

## 2014-10-10 ENCOUNTER — Other Ambulatory Visit (HOSPITAL_COMMUNITY): Payer: Self-pay | Admitting: Cardiology

## 2014-10-10 DIAGNOSIS — I10 Essential (primary) hypertension: Secondary | ICD-10-CM

## 2014-10-10 DIAGNOSIS — Z139 Encounter for screening, unspecified: Secondary | ICD-10-CM

## 2014-10-11 ENCOUNTER — Ambulatory Visit (HOSPITAL_COMMUNITY): Payer: Commercial Managed Care - PPO

## 2014-10-16 ENCOUNTER — Ambulatory Visit (HOSPITAL_COMMUNITY): Payer: Medicare Other | Attending: Cardiology | Admitting: Cardiology

## 2014-10-16 DIAGNOSIS — Z139 Encounter for screening, unspecified: Secondary | ICD-10-CM | POA: Diagnosis not present

## 2014-10-16 DIAGNOSIS — I700 Atherosclerosis of aorta: Secondary | ICD-10-CM | POA: Diagnosis not present

## 2014-10-16 DIAGNOSIS — I10 Essential (primary) hypertension: Secondary | ICD-10-CM

## 2014-10-16 NOTE — Progress Notes (Signed)
Abdominal Aorta Duplex performed  

## 2014-10-18 ENCOUNTER — Telehealth: Payer: Self-pay | Admitting: Internal Medicine

## 2014-10-18 NOTE — Telephone Encounter (Signed)
Normal results given from abdominal u/s duplex.

## 2014-10-18 NOTE — Telephone Encounter (Signed)
New MEssage  Pt returning Sherri's phone call concerning stresst test results. Please call back and discuss.

## 2014-10-23 DIAGNOSIS — R339 Retention of urine, unspecified: Secondary | ICD-10-CM | POA: Diagnosis not present

## 2014-10-23 DIAGNOSIS — N3946 Mixed incontinence: Secondary | ICD-10-CM | POA: Diagnosis not present

## 2014-10-24 ENCOUNTER — Encounter: Payer: Self-pay | Admitting: Internal Medicine

## 2014-11-06 ENCOUNTER — Other Ambulatory Visit: Payer: Self-pay | Admitting: Internal Medicine

## 2014-11-11 ENCOUNTER — Other Ambulatory Visit: Payer: Self-pay | Admitting: Internal Medicine

## 2014-11-14 ENCOUNTER — Other Ambulatory Visit: Payer: Self-pay | Admitting: *Deleted

## 2014-11-14 NOTE — Telephone Encounter (Signed)
[employer] # [phone] and received Prior Authorization for Prednisone Approved 10/15/2014 to 11/13/2017. Case #: [id number].  Spoke with [employer]. Patient Notified.

## 2014-11-26 ENCOUNTER — Telehealth: Payer: Self-pay | Admitting: Internal Medicine

## 2014-11-26 NOTE — Telephone Encounter (Signed)
Spoke w/ pt and informed her that wirex was mailed to wrong address. Re ordered wirex and mailed to correct address. And remote rescheduled from 6-13 to 6-30. Pt agreed w/ this plan.

## 2014-11-26 NOTE — Telephone Encounter (Signed)
Pt to have a device check with Caryl Comes on 6-13 but said we were to send her a piece to attach to her box for [place] to be able to get the results--pls advise

## 2015-01-09 ENCOUNTER — Ambulatory Visit (INDEPENDENT_AMBULATORY_CARE_PROVIDER_SITE_OTHER): Payer: Medicare Other | Admitting: *Deleted

## 2015-01-09 DIAGNOSIS — I442 Atrioventricular block, complete: Secondary | ICD-10-CM

## 2015-01-09 NOTE — Progress Notes (Signed)
Remote pacemaker transmission.   

## 2015-01-13 LAB — CUP PACEART REMOTE DEVICE CHECK
Battery Impedance: 1433 Ohm
Battery Remaining Longevity: 48 mo
Battery Voltage: 2.76 V
Brady Statistic AP VP Percent: 0 %
Brady Statistic AP VS Percent: 17 %
Brady Statistic AS VP Percent: 0 %
Brady Statistic AS VS Percent: 82 %
Date Time Interrogation Session: 20160630112940
Lead Channel Impedance Value: 449 Ohm
Lead Channel Pacing Threshold Amplitude: 0.5 V
Lead Channel Pacing Threshold Amplitude: 0.625 V
Lead Channel Pacing Threshold Pulse Width: 0.4 ms
Lead Channel Pacing Threshold Pulse Width: 0.4 ms
Lead Channel Sensing Intrinsic Amplitude: 1 mV
Lead Channel Sensing Intrinsic Amplitude: 8 mV
Lead Channel Setting Pacing Amplitude: 2 V
Lead Channel Setting Sensing Sensitivity: 4 mV
MDC IDC MSMT LEADCHNL RV IMPEDANCE VALUE: 567 Ohm
MDC IDC SET LEADCHNL RV PACING AMPLITUDE: 2.5 V
MDC IDC SET LEADCHNL RV PACING PULSEWIDTH: 0.4 ms

## 2015-02-14 ENCOUNTER — Encounter: Payer: Self-pay | Admitting: Cardiology

## 2015-02-21 ENCOUNTER — Encounter: Payer: Self-pay | Admitting: Internal Medicine

## 2015-03-31 DIAGNOSIS — I10 Essential (primary) hypertension: Secondary | ICD-10-CM | POA: Diagnosis not present

## 2015-03-31 DIAGNOSIS — E041 Nontoxic single thyroid nodule: Secondary | ICD-10-CM | POA: Diagnosis not present

## 2015-03-31 LAB — BASIC METABOLIC PANEL
BUN: 16 mg/dL (ref 4–21)
Creatinine: 0.6 mg/dL (ref 0.5–1.1)
Glucose: 75 mg/dL
Potassium: 3.9 mmol/L (ref 3.4–5.3)
Sodium: 143 mmol/L (ref 137–147)

## 2015-03-31 LAB — HEPATIC FUNCTION PANEL
ALK PHOS: 76 U/L (ref 25–125)
ALT: 10 U/L (ref 7–35)
AST: 15 U/L (ref 13–35)
BILIRUBIN, TOTAL: 0.6 mg/dL

## 2015-03-31 LAB — TSH: TSH: 0.97 u[IU]/mL (ref 0.41–5.90)

## 2015-04-01 ENCOUNTER — Encounter: Payer: Self-pay | Admitting: *Deleted

## 2015-04-08 ENCOUNTER — Non-Acute Institutional Stay: Payer: Medicare Other | Admitting: Internal Medicine

## 2015-04-08 ENCOUNTER — Encounter: Payer: Self-pay | Admitting: Internal Medicine

## 2015-04-08 VITALS — BP 100/62 | HR 67 | Temp 97.4°F | Resp 20 | Ht 62.0 in | Wt 134.0 lb

## 2015-04-08 DIAGNOSIS — M353 Polymyalgia rheumatica: Secondary | ICD-10-CM | POA: Diagnosis not present

## 2015-04-08 DIAGNOSIS — I472 Ventricular tachycardia: Secondary | ICD-10-CM

## 2015-04-08 DIAGNOSIS — G4733 Obstructive sleep apnea (adult) (pediatric): Secondary | ICD-10-CM

## 2015-04-08 DIAGNOSIS — I10 Essential (primary) hypertension: Secondary | ICD-10-CM | POA: Diagnosis not present

## 2015-04-08 DIAGNOSIS — K4090 Unilateral inguinal hernia, without obstruction or gangrene, not specified as recurrent: Secondary | ICD-10-CM | POA: Insufficient documentation

## 2015-04-08 DIAGNOSIS — Z7952 Long term (current) use of systemic steroids: Secondary | ICD-10-CM | POA: Diagnosis not present

## 2015-04-08 DIAGNOSIS — K220 Achalasia of cardia: Secondary | ICD-10-CM

## 2015-04-08 DIAGNOSIS — E669 Obesity, unspecified: Secondary | ICD-10-CM | POA: Diagnosis not present

## 2015-04-08 DIAGNOSIS — I4729 Other ventricular tachycardia: Secondary | ICD-10-CM

## 2015-04-08 DIAGNOSIS — R739 Hyperglycemia, unspecified: Secondary | ICD-10-CM | POA: Diagnosis not present

## 2015-04-08 NOTE — Patient Instructions (Signed)
Try taking prednisone 0.5 mg every other day. If there are no problems with this, discontinue in 3 weeks.

## 2015-04-08 NOTE — Progress Notes (Signed)
Patient ID: [name], female   DOB: May 02, 1929, 81 y.o.   MRN: [id number]    [facility]     Place of Service: Clinic (12)     Allergies  Allergen Reactions  . Actonel [Risedronate Sodium] Other (See Comments)    Joint aches; rechallenged --caused joint aches  . Ivp Dye [Iodinated Diagnostic Agents] Nausea And Vomiting    Chief Complaint  Patient presents with  . Medical Management of Chronic Issues    HPI:  Essential hypertension - controlled  Hyperglycemia - controlled  Obesity - has lost 10 pounds in 6 months. She says she is not able to hold her food down always. Occasionally tolerate small quantities due to her known achalasia and negative esophagus.  Achalasia of esophagus - losing weight.  Long term current use of systemic steroids - using for polymyalgia rheumatica. 0.5 mg daily. She thinks she may be able to tolerate a lower dose.  Paroxysmal ventricular tachycardia - no palpitations  Right inguinal hernia - new and painless area in the right lower quadrant and right groin. Lump enlarges when she stands up.  Obstructive sleep apnea - using nightly. Needs new tubing and air filters.  Polymyalgia rheumatica -using prednisone . Continues with some discomfort in fingers and shoulders.    Medications: Patient's Medications  New Prescriptions   No medications on file  Previous Medications   ACETAMINOPHEN (TYLENOL) 325 MG TABLET    Take 650 mg by mouth every 6 (six) hours as needed for moderate pain (arthritis pain).    ASCORBIC ACID (VITAMIN C) 500 MG TABLET    Take 500 mg by mouth daily.    B COMPLEX VITAMINS CAPSULE    Take 1 capsule by mouth daily.   CALCIUM CARBONATE-VIT D-MIN (CALCIUM 1200 PO)    Take 1,200 mg by mouth daily.    CHOLECALCIFEROL (VITAMIN D-3) 5000 UNITS TABS    Take 1 capsule by mouth daily.    IBUPROFEN (ADVIL,MOTRIN) 200 MG TABLET    Take 400 mg by mouth daily as needed for headache (headache).    OMEGA 3 1200 MG  CAPS    Take 1,200 mg by mouth at bedtime.    PREDNISONE (DELTASONE) 1 MG TABLET    Take 1 mg by mouth. Take 0.42m daily   PROBIOTIC PRODUCT (PROBIOTIC DAILY PO)    Take 1 capsule by mouth daily.    RANITIDINE (ZANTAC) 150 MG TABLET    Take 150-300 mg by mouth 2 (two) times daily. Take one tablet in morning and one in evening   VITAMIN A 8000 UNIT CAPSULE    Take 8,000 Units by mouth daily.  Modified Medications   No medications on file  Discontinued Medications   No medications on file     Review of Systems  Constitutional: Negative for fever, activity change, appetite change and fatigue.       Mildly overweight.  Eyes: Negative.   Respiratory:       Aspiration pneumonia December 20867complicated by subsequent empyema. Appears fully recovered as of 07/17/13.  Cardiovascular: Negative for chest pain, palpitations and leg swelling.  Gastrointestinal:       Known achalasia and megaesophagus. Some dysphagia.   Endocrine: Negative.   Genitourinary:       Chronic urinary leakage. Wears protective pads for at least 10 years. She has seen urologist in the past. Sling surgery was proposed, but she did not want to go through this.  Musculoskeletal: Positive for myalgias. Negative for  back pain and gait problem.       Diffuse muscular pains, worse in the shoulders.  Skin: Negative.   Allergic/Immunologic: Negative.   Neurological: Negative.   Hematological:       History of anemia.  Psychiatric/Behavioral: Negative.     Filed Vitals:   04/08/15 0933  BP: 100/62  Pulse: 67  Temp: 97.4 F (36.3 C)  TempSrc: Oral  Resp: 20  Height: _0  (1.575 m)  Weight: 134 lb (60.782 kg)  SpO2: 97%   Body mass index is 24.5 kg/(m^2).  Physical Exam  Constitutional: She is oriented to person, place, and time. She appears well-developed and well-nourished. No distress.  HENT:  Head: Normocephalic and atraumatic.  Right Ear: External ear normal.  Left Ear: External ear normal.  Nose: Nose  normal.  Mouth/Throat: Oropharynx is clear and moist.  Eyes:  Corrective lenses worn.  Neck: Thyromegaly present.  Multinodular goiter with nodules palpable in all 4 lobes. Cyst in the right upper lobe of the thyroid is particularly large.  Cardiovascular: Normal rate, regular rhythm and intact distal pulses.  Exam reveals friction rub. Exam reveals no gallop.   No murmur heard. Pulmonary/Chest: Effort normal and breath sounds normal. No respiratory distress. She has no wheezes. She has no rales. She exhibits no tenderness.  Abdominal: Soft. Bowel sounds are normal. She exhibits no distension and no mass. There is no tenderness.  Musculoskeletal: Normal range of motion. She exhibits no edema or tenderness.  Left leg is shorter than the right. Left 2nd and 4th hammer toes. Left 3rd toe had corrective surgery for hammer toe.  Neurological: She is alert and oriented to person, place, and time. She has normal reflexes. No cranial nerve deficit. Coordination normal.  Skin: No rash noted. No erythema.  Psychiatric: She has a normal mood and affect. Her behavior is normal. Judgment and thought content normal.     Labs reviewed: Lab Summary Latest Ref Rng 03/31/2015 09/23/2014 03/25/2014  Hemoglobin 13.0-17.0 g/dL (None) (None) (None)  Hematocrit 39.0-52.0 % (None) (None) (None)  White count - (None) (None) (None)  Platelet count - (None) (None) (None)  Sodium 137 - 147 mmol/L 143 141 141  Potassium 3.4 - 5.3 mmol/L 3.9 3.9 3.7  Calcium - (None) (None) (None)  Phosphorus - (None) (None) (None)  Creatinine 0.5 - 1.1 mg/dL 0.6 0.7 0.6  AST 13 - 35 U/L 15 16 (None)  Alk Phos 25 - 125 U/L 76 83 (None)  Bilirubin - (None) (None) (None)  Glucose - 75 89 87  Cholesterol - (None) (None) (None)  HDL cholesterol - (None) (None) (None)  Triglycerides - (None) (None) (None)  LDL Direct - (None) (None) (None)  LDL Calc - (None) (None) (None)  Total protein - (None) (None) (None)  Albumin - (None)  (None) (None)   Lab Results  Component Value Date   TSH 0.97 03/31/2015   Lab Results  Component Value Date   BUN 16 03/31/2015   Lab Results  Component Value Date   HGBA1C 5.5 09/23/2014       Assessment/Plan   1. Essential hypertension Controlled  2. Hyperglycemia Controlled  3. Obesity Resolved. Losing weight because of her achalasia.  4. Achalasia of esophagus Contributing to weight loss  5. Long term current use of systemic steroids Try to reduce to 0.5 mg every other day  6. Paroxysmal ventricular tachycardia No episodes since last seen  7. Right inguinal hernia Painless. Does not want surgical referral at this time.  8. Obstructive sleep apnea Prescription given for tubing and air filters for Respironics CPAP device  9. Polymyalgia rheumatica Reduce prednisone to 0.5 mg every other day

## 2015-04-10 DIAGNOSIS — Z23 Encounter for immunization: Secondary | ICD-10-CM | POA: Diagnosis not present

## 2015-04-14 ENCOUNTER — Ambulatory Visit (INDEPENDENT_AMBULATORY_CARE_PROVIDER_SITE_OTHER): Payer: Medicare Other | Admitting: *Deleted

## 2015-04-14 DIAGNOSIS — I442 Atrioventricular block, complete: Secondary | ICD-10-CM | POA: Diagnosis not present

## 2015-04-15 NOTE — Progress Notes (Signed)
Remote pacemaker transmission.   

## 2015-04-16 DIAGNOSIS — Z23 Encounter for immunization: Secondary | ICD-10-CM | POA: Diagnosis not present

## 2015-04-17 LAB — CUP PACEART REMOTE DEVICE CHECK
Battery Impedance: 1519 Ohm
Battery Remaining Longevity: 46 mo
Battery Voltage: 2.76 V
Date Time Interrogation Session: 20161003125804
Lead Channel Impedance Value: 438 Ohm
Lead Channel Impedance Value: 536 Ohm
Lead Channel Pacing Threshold Amplitude: 0.5 V
Lead Channel Pacing Threshold Pulse Width: 0.4 ms
Lead Channel Sensing Intrinsic Amplitude: 0.7 mV
Lead Channel Setting Pacing Amplitude: 2 V
Lead Channel Setting Sensing Sensitivity: 2.8 mV
MDC IDC MSMT LEADCHNL RA PACING THRESHOLD PULSEWIDTH: 0.4 ms
MDC IDC MSMT LEADCHNL RV PACING THRESHOLD AMPLITUDE: 0.625 V
MDC IDC MSMT LEADCHNL RV SENSING INTR AMPL: 8 mV
MDC IDC SET LEADCHNL RV PACING AMPLITUDE: 2.5 V
MDC IDC SET LEADCHNL RV PACING PULSEWIDTH: 0.4 ms
MDC IDC STAT BRADY AP VP PERCENT: 1 %
MDC IDC STAT BRADY AP VS PERCENT: 17 %
MDC IDC STAT BRADY AS VP PERCENT: 0 %
MDC IDC STAT BRADY AS VS PERCENT: 82 %

## 2015-04-29 ENCOUNTER — Encounter: Payer: Self-pay | Admitting: Internal Medicine

## 2015-05-21 ENCOUNTER — Telehealth: Payer: Self-pay

## 2015-05-21 ENCOUNTER — Encounter: Payer: Self-pay | Admitting: Cardiology

## 2015-05-21 NOTE — Telephone Encounter (Signed)
Patient called triage line indicating that some paperwork was faxed to our office last week for CPAP supplies from [place]. I looked through pending papers for review and did not see anything on patient from [place]. Please advise if you recall seeing this paperwork last week.

## 2015-05-22 NOTE — Telephone Encounter (Signed)
I do not recall seeing anything about this.

## 2015-05-27 ENCOUNTER — Encounter: Payer: Self-pay | Admitting: Internal Medicine

## 2015-06-12 NOTE — Telephone Encounter (Signed)
Faxed release of records to [phone]

## 2015-06-12 NOTE — Telephone Encounter (Signed)
Release of records received back from patient via fax and sent to [place] @ [phone]

## 2015-06-12 NOTE — Telephone Encounter (Signed)
I called patient to see if she received her supplies. Patient states she never received supplies. Patient left message with [facility] to get a status update. Patient was informed that I will call [place] as well. I called [facility] at [phone] (option 7) then (option 6).   Spoke with Jeneen Rinks @ [facility]. Need OV notes, diagnostic sleep study and follow-up OV notes within 30 days of CPAP. I was re-directed to [phone], the department of [place] that [facility]. I was told by [facility] that patient never had a CPAP through them. I was told to follow-up with patient.

## 2015-06-12 NOTE — Telephone Encounter (Signed)
Patient got CPAP supplies originally in [place]. Patient moved to [place] 3 years ago, sleep study results from [facility]. Patient aware she will have to sign a release of records for [place] to get access to these results.

## 2015-07-15 ENCOUNTER — Telehealth: Payer: Self-pay | Admitting: Cardiology

## 2015-07-15 ENCOUNTER — Ambulatory Visit (INDEPENDENT_AMBULATORY_CARE_PROVIDER_SITE_OTHER): Payer: Medicare Other | Admitting: *Deleted

## 2015-07-15 DIAGNOSIS — I442 Atrioventricular block, complete: Secondary | ICD-10-CM | POA: Diagnosis not present

## 2015-07-15 NOTE — Telephone Encounter (Signed)
Attempted to confirm remote transmission with pt. No answer and was unable to leave a message.   

## 2015-07-16 NOTE — Progress Notes (Signed)
Remote pacemaker transmission.   

## 2015-07-21 LAB — CUP PACEART REMOTE DEVICE CHECK
Battery Impedance: 1663 Ohm
Battery Remaining Longevity: 42 mo
Battery Voltage: 2.76 V
Brady Statistic AP VP Percent: 1 %
Date Time Interrogation Session: 20170103190215
Implantable Lead Implant Date: 20001227
Implantable Lead Location: 753859
Implantable Lead Location: 753860
Implantable Lead Model: 5076
Implantable Lead Model: 5076
Lead Channel Pacing Threshold Amplitude: 0.5 V
Lead Channel Pacing Threshold Amplitude: 0.625 V
Lead Channel Sensing Intrinsic Amplitude: 1 mV
Lead Channel Setting Pacing Amplitude: 2.5 V
MDC IDC LEAD IMPLANT DT: 20001227
MDC IDC MSMT LEADCHNL RA IMPEDANCE VALUE: 450 Ohm
MDC IDC MSMT LEADCHNL RA PACING THRESHOLD PULSEWIDTH: 0.4 ms
MDC IDC MSMT LEADCHNL RV IMPEDANCE VALUE: 601 Ohm
MDC IDC MSMT LEADCHNL RV PACING THRESHOLD PULSEWIDTH: 0.4 ms
MDC IDC MSMT LEADCHNL RV SENSING INTR AMPL: 8 mV
MDC IDC SET LEADCHNL RA PACING AMPLITUDE: 2 V
MDC IDC SET LEADCHNL RV PACING PULSEWIDTH: 0.4 ms
MDC IDC SET LEADCHNL RV SENSING SENSITIVITY: 4 mV
MDC IDC STAT BRADY AP VS PERCENT: 16 %
MDC IDC STAT BRADY AS VP PERCENT: 0 %
MDC IDC STAT BRADY AS VS PERCENT: 83 %

## 2015-07-23 ENCOUNTER — Encounter: Payer: Self-pay | Admitting: Cardiology

## 2015-08-18 ENCOUNTER — Ambulatory Visit (INDEPENDENT_AMBULATORY_CARE_PROVIDER_SITE_OTHER): Payer: Medicare Other

## 2015-08-18 ENCOUNTER — Ambulatory Visit (INDEPENDENT_AMBULATORY_CARE_PROVIDER_SITE_OTHER): Payer: Medicare Other | Admitting: Family Medicine

## 2015-08-18 VITALS — BP 118/64 | HR 88 | Temp 97.9°F | Resp 14 | Ht 60.5 in | Wt 129.0 lb

## 2015-08-18 DIAGNOSIS — R0781 Pleurodynia: Secondary | ICD-10-CM

## 2015-08-18 DIAGNOSIS — M79675 Pain in left toe(s): Secondary | ICD-10-CM

## 2015-08-18 DIAGNOSIS — R9389 Abnormal findings on diagnostic imaging of other specified body structures: Secondary | ICD-10-CM | POA: Insufficient documentation

## 2015-08-18 DIAGNOSIS — Y92099 Unspecified place in other non-institutional residence as the place of occurrence of the external cause: Secondary | ICD-10-CM

## 2015-08-18 DIAGNOSIS — Y92009 Unspecified place in unspecified non-institutional (private) residence as the place of occurrence of the external cause: Principal | ICD-10-CM

## 2015-08-18 DIAGNOSIS — W19XXXA Unspecified fall, initial encounter: Secondary | ICD-10-CM | POA: Diagnosis not present

## 2015-08-18 NOTE — Patient Instructions (Addendum)
Because you received an x-ray today, you will receive an invoice from [facility]. Please contact [facility] at [phone] with questions or concerns regarding your invoice. Our billing staff will not be able to assist you with those questions.   Take Tylenol (acetaminophen) maximum of 2 pills 3 times daily for pain.   Take deep breaths frequently to keep your lungs fully expanded  Take caution to avoid falls.   Return if needed.

## 2015-08-18 NOTE — Progress Notes (Signed)
Patient ID: [name], female    DOB: July 27, 1928  Age: 82 y.o. MRN: [id number]  Chief Complaint  Patient presents with  . Fall    x 3 days  . chect pain    Left sided rib pain after fall    Subjective:   82 year old lady who tripped during the night there 4 days ago at home, landing on the left rib cage on a chair. She has been hurting there. She also hurts her fourth toe, is not sure how she hurt it when she fell. No fever. No coughing up blood. Does hurt when she takes a deep breath. No nausea or vomiting or GI complaints. Dr. Nelva Nay is her regular primary care physician.  Current allergies, medications, problem list, past/family and social histories reviewed.  Objective:  BP 118/64 mmHg  Pulse 88  Temp(Src) 97.9 F (36.6 C) (Oral)  Resp 14  Ht 5' 0.5" (1.537 m)  Wt 129 lb (58.514 kg)  BMI 24.77 kg/m2  SpO2 97%  No acute distress. Pleasant elderly lady accompanied by her husband. Chest is clear to auscultation. Heart regular without murmur. She is tender in the left chest wall anterior axillary line about the eighth and ninth rib levels. She is also tender left fourth toe at the MCP area.  Assessment & Plan:   Assessment: 1. Fall at home, initial encounter   2. Rib pain on left side   3. Toe pain, left       Plan: X-ray foot and rib cage  Orders Placed This Encounter  Procedures  . DG Ribs Unilateral W/Chest Left    Order Specific Question:  Reason for Exam (SYMPTOM  OR DIAGNOSIS REQUIRED)    Answer:  4th toe pain;  left ribs    Order Specific Question:  Preferred imaging location?    Answer:  External  . DG Toe 4th Left    Standing Status: Future     Number of Occurrences: 1     Standing Expiration Date: 08/17/2016    Order Specific Question:  Reason for Exam (SYMPTOM  OR DIAGNOSIS REQUIRED)    Answer:  4th toe pain;  left ribs    Order Specific Question:  Preferred imaging location?    Answer:  External   No fractures noted on the ribs or  toe.  No orders of the defined types were placed in this encounter.         Patient Instructions  Because you received an x-ray today, you will receive an invoice from [facility]. Please contact [facility] at [phone] with questions or concerns regarding your invoice. Our billing staff will not be able to assist you with those questions.   Take Tylenol (acetaminophen) maximum of 2 pills 3 times daily for pain.   Take deep breaths frequently to keep your lungs fully expanded  Take caution to avoid falls.   Return if needed.     Return if symptoms worsen or fail to improve.   Neymar Dowe, MD 08/18/2015

## 2015-08-26 ENCOUNTER — Non-Acute Institutional Stay: Payer: Medicare Other | Admitting: Internal Medicine

## 2015-08-26 VITALS — BP 170/88 | HR 84 | Temp 97.5°F | Resp 20 | Ht 65.0 in | Wt 128.0 lb

## 2015-08-26 DIAGNOSIS — R938 Abnormal findings on diagnostic imaging of other specified body structures: Secondary | ICD-10-CM

## 2015-08-26 DIAGNOSIS — R7303 Prediabetes: Secondary | ICD-10-CM | POA: Insufficient documentation

## 2015-08-26 DIAGNOSIS — R9389 Abnormal findings on diagnostic imaging of other specified body structures: Secondary | ICD-10-CM

## 2015-08-26 NOTE — Progress Notes (Signed)
Patient ID: [name], female   DOB: 07/23/1928, 82 y.o.   MRN: [id number]    [facility]     Place of Service: Clinic (12)     Allergies  Allergen Reactions  . Actonel [Risedronate Sodium] Other (See Comments)    Joint aches; rechallenged --caused joint aches  . Ivp Dye [Iodinated Diagnostic Agents] Nausea And Vomiting    Chief Complaint  Patient presents with  . Hospitalization Follow-up    pt fell on Friday evening, cough for about 3 weeks, has questions regarding results of x-ray. not filling will since she has had cough and runny nose.Marland Kitchen    HPI:  Patient fell August 18, 2015 at home. She was seen at [facility]. She had rib pains in the left side and the left toe pain. Chest x-ray was done. Atelectasis versus pneumonia was on the final report. She returns today for follow-up. She has not run any fever. She has had an upper respiratory acute illness that has created some congestion and a cough. There has been no hemoptysis. Sputum has been scanty. Previous pains in the chest and toe have improved.  Medications: Patient's Medications  New Prescriptions   No medications on file  Previous Medications   ACETAMINOPHEN (TYLENOL) 325 MG TABLET    Take 650 mg by mouth every 6 (six) hours as needed for moderate pain (arthritis pain). Reported on 08/18/2015   ASCORBIC ACID (VITAMIN C) 500 MG TABLET    Take 500 mg by mouth daily. Reported on 08/26/2015   B COMPLEX VITAMINS CAPSULE    Take 1 capsule by mouth daily.   CALCIUM CARBONATE-VIT D-MIN (CALCIUM 1200 PO)    Take 1,200 mg by mouth daily.    CHOLECALCIFEROL (VITAMIN D-3) 5000 UNITS TABS    Take 1 capsule by mouth daily.    IBUPROFEN (ADVIL,MOTRIN) 200 MG TABLET    Take 400 mg by mouth daily as needed for headache (headache). Reported on 08/26/2015   OMEGA 3 1200 MG CAPS    Take 1,200 mg by mouth at bedtime.    PREDNISONE (DELTASONE) 1 MG TABLET    Take 0.5 mg by mouth every morning. To help polymyalgia   PROBIOTIC PRODUCT (PROBIOTIC DAILY PO)    Take 1 capsule by mouth daily.    RANITIDINE (ZANTAC) 150 MG TABLET    Take 150-300 mg by mouth 2 (two) times daily. Take one tablet in morning and one in evening  Modified Medications   No medications on file  Discontinued Medications   VITAMIN A 8000 UNIT CAPSULE    Take 8,000 Units by mouth daily.     Review of Systems  Constitutional: Negative for fever, activity change, appetite change and fatigue.       Mildly overweight.  Eyes: Negative.   Respiratory:       Aspiration pneumonia December 4734 complicated by subsequent empyema. Appears fully recovered as of 07/17/13.  Cardiovascular: Negative for chest pain, palpitations and leg swelling.  Gastrointestinal:       Known achalasia and megaesophagus. Some dysphagia.   Endocrine: Negative.   Genitourinary:       Chronic urinary leakage. Wears protective pads for at least 10 years. She has seen urologist in the past. Sling surgery was proposed, but she did not want to go through this.  Musculoskeletal: Positive for myalgias. Negative for back pain and gait problem.       Diffuse muscular pains, worse in the shoulders.  Skin: Negative.  Allergic/Immunologic: Negative.   Neurological: Negative.   Hematological:       History of anemia.  Psychiatric/Behavioral: Negative.     Filed Vitals:   08/26/15 0922  BP: 170/88  Pulse: 84  Temp: 97.5 F (36.4 C)  TempSrc: Oral  Resp: 20  Height: _0  (1.651 m)  Weight: 128 lb (58.06 kg)  SpO2: 99%   Wt Readings from Last 3 Encounters:  08/26/15 128 lb (58.06 kg)  08/18/15 129 lb (58.514 kg)  04/08/15 134 lb (60.782 kg)    Body mass index is 21.3 kg/(m^2).  Physical Exam  Constitutional: She is oriented to person, place, and time. She appears well-developed and well-nourished. No distress.  HENT:  Head: Normocephalic and atraumatic.  Right Ear: External ear normal.  Left Ear: External ear normal.  Nose: Nose normal.  Mouth/Throat:  Oropharynx is clear and moist.  Eyes:  Corrective lenses worn.  Neck: Thyromegaly present.  Multinodular goiter with nodules palpable in all 4 lobes. Cyst in the right upper lobe of the thyroid is particularly large.  Cardiovascular: Normal rate, regular rhythm and intact distal pulses.  Exam reveals friction rub. Exam reveals no gallop.   No murmur heard. Pulmonary/Chest: Effort normal and breath sounds normal. No respiratory distress. She has no wheezes. She has no rales. She exhibits no tenderness.  Abdominal: Soft. Bowel sounds are normal. She exhibits no distension and no mass. There is no tenderness.  Musculoskeletal: Normal range of motion. She exhibits no edema or tenderness.  Left leg is shorter than the right. Left 2nd and 4th hammer toes. Left 3rd toe had corrective surgery for hammer toe.  Neurological: She is alert and oriented to person, place, and time. She has normal reflexes. No cranial nerve deficit. Coordination normal.  Skin: No rash noted. No erythema.  Psychiatric: She has a normal mood and affect. Her behavior is normal. Judgment and thought content normal.     Labs reviewed: Lab Summary Latest Ref Rng 03/31/2015 09/23/2014  Hemoglobin 13.0-17.0 g/dL (None) (None)  Hematocrit 39.0-52.0 % (None) (None)  White count - (None) (None)  Platelet count - (None) (None)  Sodium 137 - 147 mmol/L 143 141  Potassium 3.4 - 5.3 mmol/L 3.9 3.9  Calcium - (None) (None)  Phosphorus - (None) (None)  Creatinine 0.5 - 1.1 mg/dL 0.6 0.7  AST 13 - 35 U/L 15 16  Alk Phos 25 - 125 U/L 76 83  Bilirubin - (None) (None)  Glucose - 75 89  Cholesterol - (None) (None)  HDL cholesterol - (None) (None)  Triglycerides - (None) (None)  LDL Direct - (None) (None)  LDL Calc - (None) (None)  Total protein - (None) (None)  Albumin - (None) (None)   Lab Results  Component Value Date   TSH 0.97 03/31/2015   Lab Results  Component Value Date   BUN 16 03/31/2015   BUN 15 09/23/2014   BUN  17 03/25/2014   Lab Results  Component Value Date   CREATININE 0.6 03/31/2015   CREATININE 0.7 09/23/2014   CREATININE 0.6 03/25/2014   Lab Results  Component Value Date   HGBA1C 5.5 09/23/2014   HGBA1C 5.5 11/05/2013       Assessment/Plan  1. Abnormal chest x-ray There is no clinical evidence to correlate with pneumonia. Chest x-ray is atelectasis of the left side. Patient is doing well right now and a covering from recent upper respiratory infection. She is advised to call should she start to run higher fevers greater than  102 or cough up streaks of blood.

## 2015-09-08 ENCOUNTER — Encounter: Payer: Self-pay | Admitting: Physician Assistant

## 2015-09-08 ENCOUNTER — Ambulatory Visit (INDEPENDENT_AMBULATORY_CARE_PROVIDER_SITE_OTHER): Payer: Medicare Other | Admitting: Physician Assistant

## 2015-09-08 ENCOUNTER — Encounter: Payer: Self-pay | Admitting: Pulmonary Disease

## 2015-09-08 ENCOUNTER — Ambulatory Visit (INDEPENDENT_AMBULATORY_CARE_PROVIDER_SITE_OTHER)
Admission: RE | Admit: 2015-09-08 | Discharge: 2015-09-08 | Disposition: A | Discharge status: Home / Self Care | Payer: Medicare Other | Source: Ambulatory Visit | Attending: Pulmonary Disease | Admitting: Pulmonary Disease

## 2015-09-08 ENCOUNTER — Ambulatory Visit (INDEPENDENT_AMBULATORY_CARE_PROVIDER_SITE_OTHER): Payer: Medicare Other | Admitting: Pulmonary Disease

## 2015-09-08 VITALS — BP 110/62 | HR 72 | Ht 62.0 in | Wt 125.2 lb

## 2015-09-08 VITALS — BP 118/72 | HR 91 | Ht 62.0 in | Wt 126.4 lb

## 2015-09-08 DIAGNOSIS — R1310 Dysphagia, unspecified: Secondary | ICD-10-CM | POA: Diagnosis not present

## 2015-09-08 DIAGNOSIS — K224 Dyskinesia of esophagus: Secondary | ICD-10-CM | POA: Diagnosis not present

## 2015-09-08 DIAGNOSIS — R918 Other nonspecific abnormal finding of lung field: Secondary | ICD-10-CM | POA: Diagnosis not present

## 2015-09-08 DIAGNOSIS — R0602 Shortness of breath: Secondary | ICD-10-CM | POA: Diagnosis not present

## 2015-09-08 DIAGNOSIS — R0600 Dyspnea, unspecified: Secondary | ICD-10-CM | POA: Diagnosis not present

## 2015-09-08 NOTE — Progress Notes (Signed)
i agree with the above note, plan 

## 2015-09-08 NOTE — Patient Instructions (Addendum)
Add Boost or Ensure to your liquid diet. Drink these at least twice daily.  You will be established with Dr. Owens Loffler.  Follow up with Nicoletta Ba PA or Dr. Ardis Hughs as needed.   You have been scheduled for a Barium Esophogram ( swallowing test)  at [facility] (1st floor of the hospital) on 09-17-2015 at 11:30 am . Please arrive at 11:15 am prior to your appointment for registration. Make certain not to have anything to eat or drink 3 hours prior to your test. If you need to reschedule for any reason, please contact radiology at [phone] to do so. __________________________________________________________________ A barium swallow is an examination that concentrates on views of the esophagus. This tends to be a double contrast exam (barium and two liquids which, when combined, create a gas to distend the wall of the oesophagus) or single contrast (non-ionic iodine based). The study is usually tailored to your symptoms so a good history is essential. Attention is paid during the study to the form, structure and configuration of the esophagus, looking for functional disorders (such as aspiration, dysphagia, achalasia, motility and reflux) EXAMINATION You may be asked to change into a gown, depending on the type of swallow being performed. A radiologist and radiographer will perform the procedure. The radiologist will advise you of the type of contrast selected for your procedure and direct you during the exam. You will be asked to stand, sit or lie in several different positions and to hold a small amount of fluid in your mouth before being asked to swallow while the imaging is performed .In some instances you may be asked to swallow barium coated marshmallows to assess the motility of a solid food bolus. The exam can be recorded as a digital or video fluoroscopy procedure. POST PROCEDURE It will take 1-2 days for the barium to pass through your system. To facilitate this, it is important,  unless otherwise directed, to increase your fluids for the next 24-48hrs and to resume your normal diet.  This test typically takes about 30 minutes to perform. __________________________________________________________________________________

## 2015-09-08 NOTE — Progress Notes (Addendum)
Subjective:    Patient ID: [name], female    DOB: [birth year], 82 y.o.   MRN: [id number]  [facility].:  Acute visit for dyspnea.  HPI  Patient has a known history of pneumonia and empyema with a prolonged course of antibiotics from aspiration in April 2014.  Dyspnea:  She reports she started having dyspnea a month ago. She reports that at the time she had a respiratory illness that included a nonproductive cough without wheezing. She had hot & cold chills at the time. She had no sore throat at the time but did have significant sinus congestion & drainage. She reports the dyspnea is on exertion.   Review of Systems She reports intermittent "achy" pain in her anterior chest. No palpitations. She believes it can happen at rest as well as with exertion. She did fall about 3 weeks ago. No new nausea or diarrhea. Does have intermittent emesis. Did have transient near syncope yesterday.   Allergies  Allergen Reactions  . Actonel [Risedronate Sodium] Other (See Comments)    Joint aches; rechallenged --caused joint aches  . Ivp Dye [Iodinated Diagnostic Agents] Nausea And Vomiting    Current Outpatient Prescriptions on File Prior to Visit  Medication Sig Dispense Refill  . acetaminophen (TYLENOL) 325 MG tablet Take 650 mg by mouth every 6 (six) hours as needed for moderate pain (arthritis pain). Reported on 08/18/2015    . ascorbic acid (VITAMIN C) 500 MG tablet Take 500 mg by mouth daily. Reported on 08/26/2015    . b complex vitamins capsule Take 1 capsule by mouth daily.    . Calcium Carbonate-Vit D-Min (CALCIUM 1200 PO) Take 1,200 mg by mouth daily.     . Cholecalciferol (VITAMIN D-3) 5000 UNITS TABS Take 1 capsule by mouth daily.     Marland Kitchen ibuprofen (ADVIL,MOTRIN) 200 MG tablet Take 400 mg by mouth daily as needed for headache (headache). Reported on 08/26/2015    . Omega 3 1200 MG CAPS Take 1,200 mg by mouth at bedtime.     . predniSONE (DELTASONE) 1 MG tablet Take 0.5 mg by mouth every  morning. To help polymyalgia    . Probiotic Product (PROBIOTIC DAILY PO) Take 1 capsule by mouth daily.     . ranitidine (ZANTAC) 150 MG tablet Take 150-300 mg by mouth 2 (two) times daily. Take one tablet in morning and one in evening     No current facility-administered medications on file prior to visit.    Past Medical History  Diagnosis Date  . GERD (gastroesophageal reflux disease)   . Empyema, right ([place]) PULMOLOGIST-- DR CLANCE    VATS 06/23/2012 cultures negative to date CXR 07/19/12 persistent airfluid levels/  CXR 11-01-2012 IMPROVE RIGHT PLEURAL EFFUSION  . Polymyalgia rheumatica ([facility])     on chronic Prednisone 5mg  daily  . Arthritis     back  . Hypertension   . OSA on CPAP   . Intrinsic urethral sphincter deficiency   . Megaesophagus   . Multiple thyroid nodules     BILATERAL --  LARGE RIGHT UPPER LOBE (MONITORED BY PCP)  . RBBB   . Chronic steroid use   . History of aspiration pneumonitis     DEC 2013  . Mixed stress and urge urinary incontinence   . Pacemaker [employer]--  LAST PACER CHECK 05-09-2013  PER PT CHECK OUT OK    CARIOLOGIST--  DR RUTH GREENFIELD AT [facility]--  PT HAS HAD NORMAL STRESS TEST AND ECHO   . Complete heart  block ([place])     S/P PACEMAKER 2000 W/ GENERATOR CHANGE 2009  . Normal cardiac stress test     PER PT AT [facility]  . H/O echocardiogram     NORMAL PER PT DONE AT [facility]  . S/P dilatation of esophageal stricture   . Complete heart block [facility])     Past Surgical History  Procedure Laterality Date  . Video bronchoscopy  06/23/2012    Procedure: VIDEO BRONCHOSCOPY;  Surgeon: Grace Isaac, MD;  Location: [place];  Service: Thoracic;  Laterality: N/A;  . Video assisted thoracoscopy (vats)/decortication  06/23/2012    Procedure: VIDEO ASSISTED THORACOSCOPY (VATS)/DECORTICATION;  Surgeon: Grace Isaac, MD;  Location: [place];  Service: Thoracic;  Laterality: Right;  . Esophagogastroduodenoscopy (egd) with esophageal dilation  06/27/2012     Procedure: ESOPHAGOGASTRODUODENOSCOPY (EGD) WITH ESOPHAGEAL DILATION;  Surgeon: Ladene Artist, MD,FACG;  Location: [place];  Service: Endoscopy;  Laterality: N/A;  . Video assisted thoracoscopy (vats)/empyema      06/23/2012  . Esophagogastroduodenoscopy  07/24/2012    Procedure: ESOPHAGOGASTRODUODENOSCOPY (EGD);  Surgeon: Inda Castle, MD;  Location: [facility];  Service: Endoscopy;  Laterality: N/A;  . Balloon dilation  07/24/2012    Procedure: BALLOON DILATION;  Surgeon: Inda Castle, MD;  Location: [facility];  Service: Endoscopy;  Laterality: N/A;  . Esophagogastroduodenoscopy  08/07/2012    Procedure: ESOPHAGOGASTRODUODENOSCOPY (EGD);  Surgeon: Inda Castle, MD;  Location: [facility];  Service: Endoscopy;  Laterality: N/A;  . Botox injection  08/07/2012    Procedure: BOTOX INJECTION;  Surgeon: Inda Castle, MD;  Location: [facility];  Service: Endoscopy;  Laterality: N/A;  . Toe surgery  2013    left 3rd toe HAMMERTOE REPAIR  . Total hip arthroplasty Right 2001  . Tonsillectomy  AS CHILD  . Cataract extraction w/ intraocular lens  implant, bilateral  2005  . Vericose vein ligation    . Appendectomy  1953    w/ removal benign kidney tumor   . Botox injection N/A 05/24/2013    Procedure: MACROPLASTIQUE IMPLANT;  Surgeon: Irine Seal, MD;  Location: [facility];  Service: Urology;  Laterality: N/A;  . Joint replacement      right hip, 2001  . Esophagogastroduodenoscopy N/A 02/25/2014    Procedure: ESOPHAGOGASTRODUODENOSCOPY (EGD);  Surgeon: Inda Castle, MD;  Location: [facility];  Service: Endoscopy;  Laterality: N/A;  . Botox injection  02/25/2014    Procedure: BOTOX INJECTION;  Surgeon: Inda Castle, MD;  Location: [facility];  Service: Endoscopy;;  . Pacemaker generator change  12/13/2007    at [facility]  . Cardiac pacemaker placement  06/1999  DR RUTH GREENFIELD AT [facility]     [employer]  ( LAST PACER CHECK 05-09-2013) for CHB/   END-OF-LIFE  GENERATOR CHANGE  2009    Family History  Problem Relation Age of Onset  . Arthritis Father   . Heart disease Father   . Cancer Brother     ?spine  . Heart disease Brother   . Lung cancer Brother   . Diabetes Son   . Heart disease Mother     Congestive heart failure  . Heart disease Brother   . Arthritis Brother   . Hypertension Brother   . Colon cancer Neg Hx     Social History   Social History  . Marital Status: Married    Spouse Name: N/A  . Number of Children: 9  . Years of Education: N/A   Occupational  History  . Retired    Social History Main Topics  . Smoking status: Passive Smoke Exposure - Never Smoker  . Smokeless tobacco: Never Used     Comment: Exposure through father only.  . Alcohol Use: No     Comment: None currently  . Drug Use: No  . Sexual Activity: No     Comment: married   Other Topics Concern  . None   Social History Narrative   L[facility] at [facility] since 05/18/2012   Married   Pacemaker   Living Will   Exercise - exercise classes 5 days a week    [employer] Don[employer] at [facility]      [facility]:   Originally from [place]. Has lived in [place], [place], [place], & moved to [place] in 1961. 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 No bird exposure.       Objective:   Physical Exam BP 118/72 mmHg  Pulse 91  Ht 5\' 2"  (1.575 m)  Wt 126 lb 6.4 oz (57.335 kg)  BMI 23.11 kg/m2  SpO2 95% General:  Awake. Alert. No acute distress. Elderly female accompanied by husband and daughter today.  Integument:  Warm & dry. No rash on exposed skin. No bruising. HEENT:  Moist mucus membranes. No oral ulcers. No scleral injection or icterus. Cardiovascular:  Regular rate. Trace lower extremity edema. Normal S1 & S2.  Pulmonary:  Good aeration & clear to auscultation bilaterally. Symmetric chest wall expansion. No accessory muscle use. Abdomen: Soft. Normal bowel sounds. Nondistended. Grossly  nontender. Musculoskeletal:  Normal bulk and tone. Hand grip strength 5/5 bilaterally. No joint deformity or effusion appreciated.  IMAGING CXR 3 VIEW 08/18/15 (personally reviewed by me):  No rib fracture. Megaesophagus noted. No new focal opacity noted. Blunting of right costophrenic angle appears chronic. Heart normal in size.     Assessment & Plan:   82 year old female recovering from recent upper respiratory infection approximately 4 weeks ago. It's difficult to discern whether or not the patient's dyspnea could be secondary to ongoing recovery versus previous injury suffered from a fall around the same time. I reviewed the patient's imaging which shows no new focal opacity of concern but certainly with continued symptoms further investigation is reasonable. I did caution the patient that she may continue to clinically improve over the next few weeks and should notify my office if this is not the case or if she needs to be seen sooner than her next scheduled appointment.   1. Dyspnea: Likely secondary to recovery from recent upper respiratory illness. Checking 6 minute walk test and full body function testing at follow-up appointment. 2. Lung opacity: I do not appreciate any new focal opacity as noted by radiology. Checking chest x-ray PA/LAT today given ongoing symptoms. 3. Follow-up: Patient to return to clinic in 4-8 weeks or sooner if needed. Instructed her she could potentially cancel this appointment if her symptoms totally resolved before.   Sonia Baller Ashok Cordia, M.D. [facility] Pager:  [phone] After 3pm or if no response, call [phone] 3:36 PM 09/08/2015

## 2015-09-08 NOTE — Progress Notes (Signed)
Patient ID: [name], female   DOB: 1928-07-27, 82 y.o.   MRN: [id number]   Subjective:    Patient ID: [name], female    DOB: 09-21-1928, 82 y.o.   MRN: [id number]  HPI  Devette is a pleasant 82 year old white female former patient of Dr. Arlyce Dice who comes in today with complaints of progressive difficulty with dysphagia. She also has gradually lost 20-25 pounds over the past year and a half and feels this is due to decreased intake. Patient has previously documented megaesophagus and had undergone prior Botox injections 2 with last EGD done in August 2015. At that time she was found to have a megaesophagus with a large amount of retained food and a stenotic GE junction. Also noted multiple 1-2 mm polyps in the gastric fundic's. She underwent Botox injections 4. She had prior manometry done in 2013 showing absent peristalsis but normal relaxation of the LES and LES baseline pressure felt to be well this was more consistent with connective tissue disease. Patient had also had one prior EGD with dilation more remotely. Patient states that she's not sure that the Botox helped her much in the past may be helped somewhat for couple of months. Over the past 3-4 months she has noticed more difficulty with dysphagia. Also has had a decrease in appetite. She has just recently added boost to her regimen. She says for the most part liquids can be swallowed without difficulty and she has more difficulty with solid food and heavier foods "sitting" in her chest. Every couple of days she may have to regurgitate something. She does have periodic heartburn and indigestion. Her family states that she will fairly frequently have coughing with her meals as well. She is currently on Zantac 150 twice a day which for the most part controls her heartburn. She has no complaints of abdominal pain or changes in her bowel habits. Patient does have history of polymyalgia rheumatica and is on chronic steroids.  She also has history of complete heart block is status post pacemaker placement and had a VATS procedure in 2013 for an empyema.  Review of Systems Pertinent positive and negative review of systems were noted in the above HPI section.  All other review of systems was otherwise negative.  Outpatient Encounter Prescriptions as of 09/08/2015  Medication Sig  . acetaminophen (TYLENOL) 325 MG tablet Take 650 mg by mouth every 6 (six) hours as needed for moderate pain (arthritis pain). Reported on 08/18/2015  . ascorbic acid (VITAMIN C) 500 MG tablet Take 500 mg by mouth daily. Reported on 08/26/2015  . b complex vitamins capsule Take 1 capsule by mouth daily.  . Calcium Carbonate-Vit D-Min (CALCIUM 1200 PO) Take 1,200 mg by mouth daily.   . Cholecalciferol (VITAMIN D-3) 5000 UNITS TABS Take 1 capsule by mouth daily.   Marland Kitchen ibuprofen (ADVIL,MOTRIN) 200 MG tablet Take 400 mg by mouth daily as needed for headache (headache). Reported on 08/26/2015  . Omega 3 1200 MG CAPS Take 1,200 mg by mouth at bedtime.   . predniSONE (DELTASONE) 1 MG tablet Take 0.5 mg by mouth every morning. To help polymyalgia  . Probiotic Product (PROBIOTIC DAILY PO) Take 1 capsule by mouth daily.   . ranitidine (ZANTAC) 150 MG tablet Take 150-300 mg by mouth 2 (two) times daily. Take one tablet in morning and one in evening   No facility-administered encounter medications on file as of 09/08/2015.   Allergies  Allergen Reactions  . Actonel [Risedronate Sodium] Other (  See Comments)    Joint aches; rechallenged --caused joint aches  . Ivp Dye [Iodinated Diagnostic Agents] Nausea And Vomiting   Patient Active Problem List   Diagnosis Date Noted  . 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 Abnormal chest x-ray 08/18/2015  . Right inguinal hernia 04/08/2015  . Pain of right scapula 04/02/2014  . Fitting and adjustment of cardiac pacemaker 03/12/2014  . Osteopenia 11/13/2013  . Urinary incontinence 11/13/2013  . Idiopathic  angio-edema-urticaria 11/13/2013  . Anemia 07/17/2013  . Hyperglycemia 07/17/2013  . Intrinsic sphincter deficiency 05/24/2013  . Urge and stress incontinence 05/24/2013  . Reflux esophagitis 01/28/2013  . Sinoatrial node dysfunction (HCC) 01/05/2013  . Sick sinus syndrome (HCC) 01/05/2013  . Obstructive sleep apnea 11/21/2012  . Disorder of bone and cartilage 11/21/2012  . Obesity 11/21/2012  . Edema 11/21/2012  . Essential hypertension 11/21/2012  . Personal history of other diseases of circulatory system 11/21/2012  . Achalasia of esophagus 10/10/2012  . Empyema (HCC) 08/17/2012  . Gastric AVM 08/07/2012  . Other dysphagia 06/27/2012  . Multinodular thyroid 06/26/2012  . Megaesophagus 06/20/2012  . Polymyalgia rheumatica (HCC) 06/19/2012  . Mediastinal mass 06/19/2012  . Hammer toe 04/17/2012  . Encounter for long-term (current) use of steroids 01/27/2012  . Long term current use of systemic steroids 01/27/2012  . Paroxysmal ventricular tachycardia (HCC) 01/18/2012  . Cardiac pacemaker in situ 01/18/2012  . Non-sustained ventricular tachycardia (HCC) 01/18/2012  . Nonsustained ventricular tachycardia (HCC) 01/18/2012  . Sprain of wrist 05/10/2011  . Disorder of rotator cuff syndrome of shoulder and allied disorder 04/01/2011  . Bursitis of shoulder 04/01/2011   Social History   Social History  . Marital Status: Married    Spouse Name: N/A  . Number of Children: 9  . Years of Education: N/A   Occupational History  . Retired    Social History Main Topics  . Smoking status: Never Smoker   . Smokeless tobacco: Never Used  . Alcohol Use: 1.8 oz/week    3 Glasses of wine per week  . Drug Use: No  . Sexual Activity: No     Comment: married   Other Topics Concern  . Not on file   Social History Narrative   Lives at [facility] since 05/18/2012   Married   Pacemaker   Living Will   Exercise - exercise classes 5 days a week    [facility] Don[facility] at [facility]    Ms. Steveson's family history includes Arthritis in her brother and father; Cancer in her brother; Diabetes in her son; Heart disease in her brother, brother, father, and mother; Hypertension in her brother. There is no history of Colon cancer.      Objective:    Filed Vitals:   09/08/15 1008  BP: 110/62  Pulse: 72    Physical Exam  well-developed elderly white female in no acute distress, pleasant accompanied by her husband and daughter blood pressure 110/62 pulse 72 height 5 foot 2 weight 125. HEENT; nontraumatic, cephalic EOMI PERRLA sclera anicteric, Cardiovascular; regular rate and rhythm with S1-S2 pacemaker left chest wall, Pulmonary ;clear bilaterally, Abdomen; soft nontender nondistended bowel sounds are active no palpable mass or hepatosplenomegaly, Rectal; exam not done, Extremities ;no clubbing cyanosis or edema she is kyphotic, Neuropsych ;mood and affect appropriate     Assessment & Plan:   #1 82 yo female With chronic dysphagia and previously documented megaesophagus. Prior manometry showed absent peristalsis but normal relaxation of  the LES therefore not consistent with achalasia. Prior Botox injections with minimal benefit Increase in symptoms over the past 3-4 months with more frequent regurgitation and patient is also had a gradual weight loss of 20 pounds #2 polymyalgia rheumatica on chronic steroids #3 history of complete heart block status post pacemaker placement #4 hypertension #5 obstructive sleep apnea  Plan; long discussion with patient and her family. She is hesitant to undergo endoscopy at this time We will schedule for barium swallow with tablet. Advised increasing boost or an sure supplements to twice daily to help increase calorie intake and also suggested she stick to a very soft to full liquid diet with frequent small feedings. Continue Zantac 150 mg by mouth twice a day Upright positioning for meals and for 2 hours  postprandially. Patient will be established with Dr. Christella Hartigan, and will follow up with Dr. Christella Hartigan or myself as needed.  Annelise Mccoy Oswald Hillock PA-C 09/08/2015   Cc: [name], MD

## 2015-09-08 NOTE — Patient Instructions (Signed)
   Please call me if you have any worsening in her breathing before your next appointment  My office will contact you with the results of your chest x-ray  We will arrange for a walking and breathing test at your next appointment  He will have a follow-up in 4-8 weeks with my office but if her symptoms have totally resolved please feel free to cancel unless you would like to discuss further issues  TESTS ORDERED: 1. CXR PA/LAT today 2. 6 minute walk test on room air at next appointment 3. Full pulmonary function testing at next appointment

## 2015-09-17 ENCOUNTER — Ambulatory Visit (HOSPITAL_COMMUNITY)
Admission: RE | Admit: 2015-09-17 | Discharge: 2015-09-17 | Disposition: A | Discharge status: Home / Self Care | Payer: Medicare Other | Source: Ambulatory Visit | Attending: Physician Assistant | Admitting: Physician Assistant

## 2015-09-17 DIAGNOSIS — R1310 Dysphagia, unspecified: Secondary | ICD-10-CM | POA: Diagnosis not present

## 2015-09-17 DIAGNOSIS — K220 Achalasia of cardia: Secondary | ICD-10-CM | POA: Insufficient documentation

## 2015-09-19 ENCOUNTER — Encounter: Payer: Self-pay | Admitting: *Deleted

## 2015-09-22 ENCOUNTER — Ambulatory Visit (INDEPENDENT_AMBULATORY_CARE_PROVIDER_SITE_OTHER): Payer: Medicare Other | Admitting: Physician Assistant

## 2015-09-22 ENCOUNTER — Encounter: Payer: Self-pay | Admitting: Physician Assistant

## 2015-09-22 VITALS — BP 90/56 | HR 100 | Ht 60.25 in | Wt 123.1 lb

## 2015-09-22 DIAGNOSIS — R634 Abnormal weight loss: Secondary | ICD-10-CM

## 2015-09-22 DIAGNOSIS — K220 Achalasia of cardia: Secondary | ICD-10-CM | POA: Diagnosis not present

## 2015-09-22 NOTE — Progress Notes (Signed)
Patient ID: [name], female   DOB: 03/27/1929, 82 y.o.   MRN: [id number]   Subjective:    Patient ID: [name], female    DOB: February 12, 1929, 82 y.o.   MRN: [id number]  HPI  Jill is very nice 82 year old white female who was last seen in the office on 09/08/2015. She's a former patient of Dr. Kelby Fam who is now established with Dr. Ardis Hughs. In progressive difficulty with dysphagia and has lost about 25 pounds over the past year and half due to decreased oral intake. She has a previously documented megaesophagus and had undergone prior Botox injections 2 last in August 2015. She does not recall how well the Botox alleviated her symptoms. Manometry which had been done in 2013 showed absent peristalsis but normal relaxation of the LES and LES baseline pressure felt to be low more consistent with connective tissue disease. We obtained barium swallow which shows the chronic megaesophagus and absent motility, tortuous esophagus and 8 beaked appearance at the GE junction. There was abundant debris in the esophagus. She was brought back to the office today to discuss repeat EGD with Botox and also to begin discussions about possible PEG placement if she is unable to sustain herself orally. She has been primarily on liquids over the past week and has been drinking one boost per day. Her weight is down 2 pounds since last office visit. She usually has an episode of regurgitation of a meal at least every other day.  Review of Systems Pertinent positive and negative review of systems were noted in the above HPI section.  All other review of systems was otherwise negative.  Outpatient Encounter Prescriptions as of 09/22/2015  Medication Sig  . acetaminophen (TYLENOL) 325 MG tablet Take 650 mg by mouth every 6 (six) hours as needed for moderate pain (arthritis pain). Reported on 08/18/2015  . ascorbic acid (VITAMIN C) 500 MG tablet Take 500 mg by mouth daily. Reported on 08/26/2015  . b complex  vitamins capsule Take 1 capsule by mouth daily.  . Calcium Carbonate-Vit D-Min (CALCIUM 1200 PO) Take 1,200 mg by mouth daily.   . Cholecalciferol (VITAMIN D-3) 1000 units CAPS Take 1 capsule by mouth daily.  Marland Kitchen ibuprofen (ADVIL,MOTRIN) 200 MG tablet Take 400 mg by mouth daily as needed for headache (headache). Reported on 08/26/2015  . Omega 3 1200 MG CAPS Take 1,200 mg by mouth at bedtime.   . predniSONE (DELTASONE) 1 MG tablet Take 0.5 mg by mouth every morning. To help polymyalgia  . Probiotic Product (PROBIOTIC DAILY PO) Take 1 capsule by mouth daily.   . ranitidine (ZANTAC) 150 MG tablet Take 150-300 mg by mouth 2 (two) times daily. Take one tablet in morning and one in evening  . [DISCONTINUED] Cholecalciferol (VITAMIN D-3) 5000 UNITS TABS Take 1 capsule by mouth daily.    No facility-administered encounter medications on file as of 09/22/2015.   Allergies  Allergen Reactions  . Actonel [Risedronate Sodium] Other (See Comments)    Joint aches; rechallenged --caused joint aches  . Ivp Dye [Iodinated Diagnostic Agents] Nausea And Vomiting   Patient Active Problem List   Diagnosis Date Noted  . 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 Abnormal chest x-ray 08/18/2015  . Right inguinal hernia 04/08/2015  . Pain of right scapula 04/02/2014  . Fitting and adjustment of cardiac pacemaker 03/12/2014  . Osteopenia 11/13/2013  . Urinary incontinence 11/13/2013  . Idiopathic angio-edema-urticaria 11/13/2013  . Anemia 07/17/2013  . Hyperglycemia 07/17/2013  . Intrinsic  sphincter deficiency 05/24/2013  . Urge and stress incontinence 05/24/2013  . Reflux esophagitis 01/28/2013  . Sinoatrial node dysfunction ([place]) 01/05/2013  . Sick sinus syndrome ([place]) 01/05/2013  . Obstructive sleep apnea 11/21/2012  . Disorder of bone and cartilage 11/21/2012  . Obesity 11/21/2012  . Edema 11/21/2012  . Essential hypertension 11/21/2012  . Personal history of other diseases of circulatory system  11/21/2012  . Achalasia of esophagus 10/10/2012  . Gastric AVM 08/07/2012  . Other dysphagia 06/27/2012  . Multinodular thyroid 06/26/2012  . Megaesophagus 06/20/2012  . Polymyalgia rheumatica ([place]) 06/19/2012  . Mediastinal mass 06/19/2012  . Hammer toe 04/17/2012  . Encounter for long-term (current) use of steroids 01/27/2012  . Long term current use of systemic steroids 01/27/2012  . Paroxysmal ventricular tachycardia ([place]) 01/18/2012  . Cardiac pacemaker in situ 01/18/2012  . Non-sustained ventricular tachycardia ([place]) 01/18/2012  . Nonsustained ventricular tachycardia ([place]) 01/18/2012  . Sprain of wrist 05/10/2011  . Disorder of rotator cuff syndrome of shoulder and allied disorder 04/01/2011  . Bursitis of shoulder 04/01/2011   Social History   Social History  . Marital Status: Married    Spouse Name: N/A  . Number of Children: 68  . Years of Education: N/A   Occupational History  . Retired    Social History Main Topics  . Smoking status: Passive Smoke Exposure - Never Smoker  . Smokeless tobacco: Never Used     Comment: Exposure through father only.  . Alcohol Use: No     Comment: None currently  . Drug Use: No  . Sexual Activity: No     Comment: married   Other Topics Concern  . Not on file   Social History Narrative   Lives at [facility] since 05/18/2012   Married   Pacemaker   Living Will   Exercise - exercise classes 5 days a week    Whole Body Donation at [facility]      [facility]:   Originally from [place]. Has lived in [place], [place], [place], & moved to [place] in 1961. 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 No bird exposure.     Ms. Oneill's family history includes Arthritis in her brother and father; Cancer in her brother; Diabetes in her son; Heart disease in her brother, brother, father, and mother; Hypertension in her brother; Lung cancer in her brother. There is no history of Colon  cancer.      Objective:    Filed Vitals:   09/22/15 1402  BP: 90/56  Pulse: 100    Physical Exam  well-developed elderly white female in no acute distress, accompanied by her husband. Blood pressure 90/56 pulse 100 height 5 foot weight 123 and 2 pounds from last office visit 3 weeks ago. Not further examined today discussion only     Assessment & Plan:   #1 82 yo female  With probable achalasia with progressive difficulty with dysphagia and weight loss. Megaesophagus with absent motility and beaked appearance of the distal esophagus documented on recent barium swallow. Questionable benefit from previous Botox injection   #2 polymyalgia rheumatica   #3 sleep apnea #4 HTN #5 hx remote VT  Plan;  discussed options with patient and her husband. She would like to have repeat EGD with Botox injection to see if she gets any benefit. Procedure is scheduled with Dr. Ardis Hughs at [facility]. Risks and benefits discussed and she is agreeable to proceed We also discussed potential PEG  tube lace meant, obviously she would like to avoid this. She is able to swallow liquids better than solids and suggested that she increase boost supplements to at least 3 cans daily, she will also try to work on 5 small feedings per day in order to increase calorie intake and focus on thick liquid to very soft diet with goal of at least maintaining current weight  Amy Genia Harold PA-C 09/22/2015   Cc: Estill Dooms, MD

## 2015-09-22 NOTE — Patient Instructions (Signed)

## 2015-09-22 NOTE — Progress Notes (Signed)
[place], thanks.  I'll send to patty as well about timing of the EGD with Botox  Patty, She needs EGD next available EUS Thursday with botox, MAC preferable

## 2015-10-01 ENCOUNTER — Other Ambulatory Visit (HOSPITAL_COMMUNITY): Payer: Self-pay | Admitting: *Deleted

## 2015-10-01 ENCOUNTER — Encounter (HOSPITAL_COMMUNITY): Payer: Self-pay | Admitting: *Deleted

## 2015-10-01 NOTE — Progress Notes (Signed)
Spoke with dr Landry Dyke anesthesia and madea aware chest xray results 09-08-15, order received from dr ewell to repeat chest xray day of procedure 10-09-15

## 2015-10-09 ENCOUNTER — Encounter (HOSPITAL_COMMUNITY)
Admission: RE | Disposition: A | Discharge status: Home / Self Care | Payer: Self-pay | Source: Ambulatory Visit | Attending: Gastroenterology

## 2015-10-09 ENCOUNTER — Ambulatory Visit (HOSPITAL_COMMUNITY): Payer: Medicare Other | Admitting: Anesthesiology

## 2015-10-09 ENCOUNTER — Encounter (HOSPITAL_COMMUNITY): Payer: Self-pay | Admitting: *Deleted

## 2015-10-09 ENCOUNTER — Ambulatory Visit (HOSPITAL_COMMUNITY)
Admission: RE | Admit: 2015-10-09 | Discharge: 2015-10-09 | Disposition: A | Discharge status: Home / Self Care | Payer: Medicare Other | Source: Ambulatory Visit | Attending: Gastroenterology | Admitting: Gastroenterology

## 2015-10-09 ENCOUNTER — Telehealth: Payer: Self-pay

## 2015-10-09 ENCOUNTER — Ambulatory Visit (HOSPITAL_COMMUNITY): Payer: Medicare Other

## 2015-10-09 DIAGNOSIS — Z950 Presence of cardiac pacemaker: Secondary | ICD-10-CM | POA: Insufficient documentation

## 2015-10-09 DIAGNOSIS — X58XXXA Exposure to other specified factors, initial encounter: Secondary | ICD-10-CM | POA: Insufficient documentation

## 2015-10-09 DIAGNOSIS — T18128A Food in esophagus causing other injury, initial encounter: Secondary | ICD-10-CM | POA: Diagnosis not present

## 2015-10-09 DIAGNOSIS — R634 Abnormal weight loss: Secondary | ICD-10-CM | POA: Diagnosis not present

## 2015-10-09 DIAGNOSIS — R1310 Dysphagia, unspecified: Secondary | ICD-10-CM | POA: Diagnosis not present

## 2015-10-09 DIAGNOSIS — R05 Cough: Secondary | ICD-10-CM | POA: Diagnosis not present

## 2015-10-09 DIAGNOSIS — K220 Achalasia of cardia: Secondary | ICD-10-CM | POA: Insufficient documentation

## 2015-10-09 DIAGNOSIS — G4733 Obstructive sleep apnea (adult) (pediatric): Secondary | ICD-10-CM | POA: Insufficient documentation

## 2015-10-09 DIAGNOSIS — M353 Polymyalgia rheumatica: Secondary | ICD-10-CM | POA: Insufficient documentation

## 2015-10-09 DIAGNOSIS — K225 Diverticulum of esophagus, acquired: Secondary | ICD-10-CM | POA: Insufficient documentation

## 2015-10-09 DIAGNOSIS — Z7952 Long term (current) use of systemic steroids: Secondary | ICD-10-CM | POA: Insufficient documentation

## 2015-10-09 DIAGNOSIS — J449 Chronic obstructive pulmonary disease, unspecified: Secondary | ICD-10-CM | POA: Diagnosis not present

## 2015-10-09 DIAGNOSIS — K219 Gastro-esophageal reflux disease without esophagitis: Secondary | ICD-10-CM | POA: Insufficient documentation

## 2015-10-09 DIAGNOSIS — I10 Essential (primary) hypertension: Secondary | ICD-10-CM | POA: Insufficient documentation

## 2015-10-09 DIAGNOSIS — Z7722 Contact with and (suspected) exposure to environmental tobacco smoke (acute) (chronic): Secondary | ICD-10-CM | POA: Insufficient documentation

## 2015-10-09 DIAGNOSIS — Z79899 Other long term (current) drug therapy: Secondary | ICD-10-CM | POA: Insufficient documentation

## 2015-10-09 DIAGNOSIS — R9389 Abnormal findings on diagnostic imaging of other specified body structures: Secondary | ICD-10-CM

## 2015-10-09 DIAGNOSIS — Z6824 Body mass index (BMI) 24.0-24.9, adult: Secondary | ICD-10-CM | POA: Diagnosis not present

## 2015-10-09 DIAGNOSIS — R0602 Shortness of breath: Secondary | ICD-10-CM | POA: Diagnosis not present

## 2015-10-09 DIAGNOSIS — J439 Emphysema, unspecified: Secondary | ICD-10-CM | POA: Insufficient documentation

## 2015-10-09 HISTORY — DX: Anemia, unspecified: D64.9

## 2015-10-09 HISTORY — PX: ESOPHAGOGASTRODUODENOSCOPY (EGD) WITH PROPOFOL: SHX5813

## 2015-10-09 SURGERY — ESOPHAGOGASTRODUODENOSCOPY (EGD) WITH PROPOFOL
Anesthesia: General

## 2015-10-09 MED ORDER — ONDANSETRON HCL 4 MG/2ML IJ SOLN
INTRAMUSCULAR | Status: DC | PRN
  Administered 2015-10-09 (×4): 2 mg via INTRAVENOUS

## 2015-10-09 MED ORDER — SUCCINYLCHOLINE CHLORIDE 20 MG/ML IJ SOLN
INTRAMUSCULAR | Status: DC | PRN
  Administered 2015-10-09: 80 mg via INTRAVENOUS

## 2015-10-09 MED ORDER — SODIUM CHLORIDE 0.9 % IJ SOLN
100.0000 [IU] | Freq: Once | INTRAMUSCULAR | Status: DC
  Filled 2015-10-09: qty 100

## 2015-10-09 MED ORDER — PHENYLEPHRINE 40 MCG/ML (10ML) SYRINGE FOR IV PUSH (FOR BLOOD PRESSURE SUPPORT)
PREFILLED_SYRINGE | INTRAVENOUS | Status: AC
  Filled 2015-10-09: qty 10

## 2015-10-09 MED ORDER — PHENYLEPHRINE HCL 10 MG/ML IJ SOLN
INTRAMUSCULAR | Status: DC | PRN
  Administered 2015-10-09: 80 ug via INTRAVENOUS
  Administered 2015-10-09: 40 ug via INTRAVENOUS
  Administered 2015-10-09 (×2): 80 ug via INTRAVENOUS

## 2015-10-09 MED ORDER — LIDOCAINE HCL (CARDIAC) 20 MG/ML IV SOLN
INTRAVENOUS | Status: DC | PRN
  Administered 2015-10-09: 60 mg via INTRAVENOUS

## 2015-10-09 MED ORDER — SODIUM CHLORIDE 0.9 % IV SOLN
INTRAVENOUS | Status: DC
  Administered 2015-10-09: 11:00:00 via INTRAVENOUS

## 2015-10-09 MED ORDER — ONDANSETRON HCL 4 MG/2ML IJ SOLN
INTRAMUSCULAR | Status: AC
  Filled 2015-10-09: qty 2

## 2015-10-09 MED ORDER — LACTATED RINGERS IV SOLN
INTRAVENOUS | Status: DC
  Administered 2015-10-09: 1000 mL via INTRAVENOUS

## 2015-10-09 MED ORDER — LIDOCAINE HCL (CARDIAC) 20 MG/ML IV SOLN
INTRAVENOUS | Status: AC
  Filled 2015-10-09: qty 5

## 2015-10-09 MED ORDER — PROPOFOL 500 MG/50ML IV EMUL
INTRAVENOUS | Status: DC | PRN
  Administered 2015-10-09: 100 ug/kg/min via INTRAVENOUS

## 2015-10-09 MED ORDER — ONABOTULINUMTOXINA 100 UNITS IJ SOLR
INTRAMUSCULAR | Status: DC | PRN
  Administered 2015-10-09: 100 [IU]

## 2015-10-09 MED ORDER — PROPOFOL 10 MG/ML IV BOLUS
INTRAVENOUS | Status: AC
  Filled 2015-10-09: qty 40

## 2015-10-09 MED ORDER — PROPOFOL 10 MG/ML IV BOLUS
INTRAVENOUS | Status: DC | PRN
  Administered 2015-10-09: 60 mg via INTRAVENOUS
  Administered 2015-10-09: 50 mg via INTRAVENOUS

## 2015-10-09 SURGICAL SUPPLY — 14 items

## 2015-10-09 NOTE — Transfer of Care (Signed)
Immediate Anesthesia Transfer of Care Note  Patient: [name]  Procedure(s) Performed: Procedure(s): ESOPHAGOGASTRODUODENOSCOPY (EGD) WITH PROPOFOL ( WITH BOTOX) (N/A)  Patient Location: PACU  Anesthesia Type:General  Level of Consciousness:  sedated, patient cooperative and responds to stimulation  Airway & Oxygen Therapy:Patient Spontanous Breathing and Patient connected to nasal oxgen  Post-op Assessment:  Report given to PACU RN and Post -op Vital signs reviewed and stable  Post vital signs:  Reviewed and stable  Last Vitals:  Filed Vitals:   10/09/15 0936 10/09/15 1205  BP: 138/88 130/44  Pulse: 74 64  Temp: 37 C 37 C  Resp: 16 24    Complications: No apparent anesthesia complications

## 2015-10-09 NOTE — Anesthesia Preprocedure Evaluation (Addendum)
Anesthesia Evaluation  Patient identified by MRN, date of birth, ID band Patient awake    Reviewed: Allergy & Precautions, H&P , NPO status , Patient's Chart, lab work & pertinent test results  Airway Mallampati: II  TM Distance: >3 FB Neck ROM: full    Dental no notable dental hx. (+) Dental Advisory Given, Teeth Intact   Pulmonary sleep apnea and Continuous Positive Airway Pressure Ventilation , COPD,  Chronic aspiration   Pulmonary exam normal breath sounds clear to auscultation       Cardiovascular hypertension, + dysrhythmias Ventricular Tachycardia + pacemaker  Rhythm:regular Rate:Normal     Neuro/Psych negative neurological ROS  negative psych ROS   GI/Hepatic negative GI ROS, Neg liver ROS, GERD  Medicated and Controlled,  Endo/Other  negative endocrine ROS  Renal/GU negative Renal ROS  negative genitourinary   Musculoskeletal   Abdominal   Peds  Hematology negative hematology ROS (+)   Anesthesia Other Findings Chronic steroids  Reproductive/Obstetrics negative OB ROS                            Anesthesia Physical Anesthesia Plan  ASA: III  Anesthesia Plan: MAC   Post-op Pain Management:    Induction:   Airway Management Planned:   Additional Equipment:   Intra-op Plan:   Post-operative Plan:   Informed Consent: I have reviewed the patients History and Physical, chart, labs and discussed the procedure including the risks, benefits and alternatives for the proposed anesthesia with the patient or authorized representative who has indicated his/her understanding and acceptance.   Dental Advisory Given  Plan Discussed with: CRNA and Surgeon  Anesthesia Plan Comments:        Anesthesia Quick Evaluation

## 2015-10-09 NOTE — Op Note (Signed)
[facility] Patient Name: [name] Procedure Date: 10/09/2015 MRN: [id number] Attending MD: Milus Banister , MD Date of Birth: [birth year] CSN:  Age: 82 Admit Type: Outpatient Procedure:                Upper GI endoscopy Indications:              Dysphagia, presumed achalasia Providers:                Milus Banister, MD, Sarah Monday, RN, Elspeth Cho, Technician Referring MD:              Medicines:                General Anesthesia Complications:            No immediate complications. Estimated blood loss:                            None. Estimated Blood Loss:     Estimated blood loss: none. Procedure:                Pre-Anesthesia Assessment:                           - Prior to the procedure, a History and Physical                            was performed, and patient medications and                            allergies were reviewed. The patient's tolerance of                            previous anesthesia was also reviewed. The risks                            and benefits of the procedure and the sedation                            options and risks were discussed with the patient.                            All questions were answered, and informed consent                            was obtained. Prior Anticoagulants: The patient has                            taken no previous anticoagulant or antiplatelet                            agents. ASA Grade Assessment: III - A patient with                            severe systemic  disease. After reviewing the risks                            and benefits, the patient was deemed in                            satisfactory condition to undergo the procedure.                           After obtaining informed consent, the endoscope was                            passed under direct vision. Throughout the                            procedure, the patient's blood pressure, pulse,  and                            oxygen saturations were monitored continuously. The                            [id number] scope was introduced through the                            mouth, and advanced to the antrum of the stomach.                            The upper GI endoscopy was accomplished without                            difficulty. The patient tolerated the procedure                            well. Scope In: Scope Out: Findings:      She has a tortuous very wide diameter esophagus (mega-esophagus) that       was filled with solid and liquid food. After noting the extensive       retained food, I spoke with her daughter about elective ET intubation       for airway protection and she agreed. Over about 90 minutes I was able       to remove about 95% of the retained food using bio-vac suction, multiple       Roth nets. Eventually I was able to get down to the region of GE       junction, epiphrenic diverticulum. The GE junction was very snug with       normal mucosa. Views of stomach were normal including retroflex. I       proceed with Botox injection of the GE junction (100 units divided into       four quadrants). Impression:               - Food in the esophagus. Removal was successful. Moderate Sedation:      N/A- Per Anesthesia Care Recommendation:           - Patient has a contact number available for  emergencies. The signs and symptoms of potential                            delayed complications were discussed with the                            patient. Return to normal activities tomorrow.                            Written discharge instructions were provided to the                            patient.                           - Pureed diet.                           - Continue present medications.                           - [facility] office appointment with Dr. Lawerance Bach in the next 1-2 weeks to  discuss pneumatic                            dilation. Family and patient to also consider                            feeding tube placement. Procedure Code(s):        --- Professional ---                           [phone], 89, Esophagogastroduodenoscopy, flexible,                            transoral; with removal of foreign body(s) Diagnosis Code(s):        --- Professional ---                           [id number], Food in esophagus causing other injury,                            initial encounter                           R13.10, Dysphagia, unspecified CPT copyright 2016 American Medical Association. All rights reserved. The codes documented in this report are preliminary and upon coder review may  be revised to meet current compliance requirements. Milus Banister, MD 10/09/2015 12:19:40 PM This report has been signed electronically. Number of Addenda: 0

## 2015-10-09 NOTE — Anesthesia Postprocedure Evaluation (Signed)
Anesthesia Post Note  Patient: ABBIGAL CURLEE  Procedure(s) Performed: Procedure(s) (LRB): ESOPHAGOGASTRODUODENOSCOPY (EGD) WITH PROPOFOL ( WITH BOTOX) (N/A)  Patient location during evaluation: PACU Anesthesia Type: General Level of consciousness: awake and alert Pain management: pain level controlled Vital Signs Assessment: post-procedure vital signs reviewed and stable Respiratory status: spontaneous breathing, nonlabored ventilation, respiratory function stable and patient connected to nasal cannula oxygen Cardiovascular status: blood pressure returned to baseline and stable Postop Assessment: no signs of nausea or vomiting Anesthetic complications: no    Last Vitals:  Filed Vitals:   10/09/15 0936 10/09/15 1205  BP: 138/88 130/44  Pulse: 74 64  Temp: 37 C 37 C  Resp: 16 24    Last Pain: There were no vitals filed for this visit.               Ryann Leavitt L

## 2015-10-09 NOTE — Discharge Instructions (Signed)

## 2015-10-09 NOTE — Telephone Encounter (Signed)
Appt made for 10/20/15 145 pm letter mailed to the pt

## 2015-10-09 NOTE — Interval H&P Note (Signed)
History and Physical Interval Note:  10/09/2015 9:39 AM  [name]  has presented today for surgery, with the diagnosis of Achalasia Weight loss  The various methods of treatment have been discussed with the patient and family. After consideration of risks, benefits and other options for treatment, the patient has consented to  Procedure(s): ESOPHAGOGASTRODUODENOSCOPY (EGD) WITH PROPOFOL ( WITH BOTOX) (N/A) as a surgical intervention .  The patient's history has been reviewed, patient examined, no change in status, stable for surgery.  I have reviewed the patient's chart and labs.  Questions were answered to the patient's satisfaction.     Milus Banister

## 2015-10-09 NOTE — Anesthesia Procedure Notes (Signed)
Procedure Name: Intubation Date/Time: 10/09/2015 10:51 AM Performed by: Freddie Breech Pre-anesthesia Checklist: Patient identified, Emergency Drugs available, Suction available, Patient being monitored and Timeout performed Patient Re-evaluated:Patient Re-evaluated prior to inductionOxygen Delivery Method: Circle system utilized Preoxygenation: Pre-oxygenation with 100% oxygen Intubation Type: IV induction, Cricoid Pressure applied and Rapid sequence Laryngoscope Size: Mac and 3 Grade View: Grade I Tube type: Oral Tube size: 7.0 mm Number of attempts: 1 Airway Equipment and Method: Patient positioned with wedge pillow and Stylet Placement Confirmation: ETT inserted through vocal cords under direct vision,  positive ETCO2,  CO2 detector and breath sounds checked- equal and bilateral Secured at: 22 cm Tube secured with: Tape Dental Injury: Teeth and Oropharynx as per pre-operative assessment

## 2015-10-09 NOTE — H&P (View-Only) (Signed)
Patient ID: [name], female   DOB: 03/27/1929, 82 y.o.   MRN: [id number]   Subjective:    Patient ID: [name], female    DOB: February 12, 1929, 82 y.o.   MRN: [id number]  HPI  [name] is very nice 82 year old white female who was last seen in the office on 09/08/2015. She's a former patient of Dr. Kelby Fam who is now established with Dr. Ardis Hughs. In progressive difficulty with dysphagia and has lost about 25 pounds over the past year and half due to decreased oral intake. She has a previously documented megaesophagus and had undergone prior Botox injections 2 last in August 2015. She does not recall how well the Botox alleviated her symptoms. Manometry which had been done in 2013 showed absent peristalsis but normal relaxation of the LES and LES baseline pressure felt to be low more consistent with connective tissue disease. We obtained barium swallow which shows the chronic megaesophagus and absent motility, tortuous esophagus and 8 beaked appearance at the GE junction. There was abundant debris in the esophagus. She was brought back to the office today to discuss repeat EGD with Botox and also to begin discussions about possible PEG placement if she is unable to sustain herself orally. She has been primarily on liquids over the past week and has been drinking one boost per day. Her weight is down 2 pounds since last office visit. She usually has an episode of regurgitation of a meal at least every other day.  Review of Systems Pertinent positive and negative review of systems were noted in the above HPI section.  All other review of systems was otherwise negative.  Outpatient Encounter Prescriptions as of 09/22/2015  Medication Sig  . acetaminophen (TYLENOL) 325 MG tablet Take 650 mg by mouth every 6 (six) hours as needed for moderate pain (arthritis pain). Reported on 08/18/2015  . ascorbic acid (VITAMIN C) 500 MG tablet Take 500 mg by mouth daily. Reported on 08/26/2015  . b complex  vitamins capsule Take 1 capsule by mouth daily.  . Calcium Carbonate-Vit D-Min (CALCIUM 1200 PO) Take 1,200 mg by mouth daily.   . Cholecalciferol (VITAMIN D-3) 1000 units CAPS Take 1 capsule by mouth daily.  Marland Kitchen ibuprofen (ADVIL,MOTRIN) 200 MG tablet Take 400 mg by mouth daily as needed for headache (headache). Reported on 08/26/2015  . Omega 3 1200 MG CAPS Take 1,200 mg by mouth at bedtime.   . predniSONE (DELTASONE) 1 MG tablet Take 0.5 mg by mouth every morning. To help polymyalgia  . Probiotic Product (PROBIOTIC DAILY PO) Take 1 capsule by mouth daily.   . ranitidine (ZANTAC) 150 MG tablet Take 150-300 mg by mouth 2 (two) times daily. Take one tablet in morning and one in evening  . [DISCONTINUED] Cholecalciferol (VITAMIN D-3) 5000 UNITS TABS Take 1 capsule by mouth daily.    No facility-administered encounter medications on file as of 09/22/2015.   Allergies  Allergen Reactions  . Actonel [Risedronate Sodium] Other (See Comments)    Joint aches; rechallenged --caused joint aches  . Ivp Dye [Iodinated Diagnostic Agents] Nausea And Vomiting   Patient Active Problem List   Diagnosis Date Noted  . 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 Abnormal chest x-ray 08/18/2015  . Right inguinal hernia 04/08/2015  . Pain of right scapula 04/02/2014  . Fitting and adjustment of cardiac pacemaker 03/12/2014  . Osteopenia 11/13/2013  . Urinary incontinence 11/13/2013  . Idiopathic angio-edema-urticaria 11/13/2013  . Anemia 07/17/2013  . Hyperglycemia 07/17/2013  . Intrinsic  sphincter deficiency 05/24/2013  . Urge and stress incontinence 05/24/2013  . Reflux esophagitis 01/28/2013  . Sinoatrial node dysfunction ([place]) 01/05/2013  . Sick sinus syndrome ([place]) 01/05/2013  . Obstructive sleep apnea 11/21/2012  . Disorder of bone and cartilage 11/21/2012  . Obesity 11/21/2012  . Edema 11/21/2012  . Essential hypertension 11/21/2012  . Personal history of other diseases of circulatory system  11/21/2012  . Achalasia of esophagus 10/10/2012  . Gastric AVM 08/07/2012  . Other dysphagia 06/27/2012  . Multinodular thyroid 06/26/2012  . Megaesophagus 06/20/2012  . Polymyalgia rheumatica ([place]) 06/19/2012  . Mediastinal mass 06/19/2012  . Hammer toe 04/17/2012  . Encounter for long-term (current) use of steroids 01/27/2012  . Long term current use of systemic steroids 01/27/2012  . Paroxysmal ventricular tachycardia ([place]) 01/18/2012  . Cardiac pacemaker in situ 01/18/2012  . Non-sustained ventricular tachycardia ([place]) 01/18/2012  . Nonsustained ventricular tachycardia ([place]) 01/18/2012  . Sprain of wrist 05/10/2011  . Disorder of rotator cuff syndrome of shoulder and allied disorder 04/01/2011  . Bursitis of shoulder 04/01/2011   Social History   Social History  . Marital Status: Married    Spouse Name: N/A  . Number of Children: 68  . Years of Education: N/A   Occupational History  . Retired    Social History Main Topics  . Smoking status: Passive Smoke Exposure - Never Smoker  . Smokeless tobacco: Never Used     Comment: Exposure through father only.  . Alcohol Use: No     Comment: None currently  . Drug Use: No  . Sexual Activity: No     Comment: married   Other Topics Concern  . Not on file   Social History Narrative   Lives at [facility] since 05/18/2012   Married   Pacemaker   Living Will   Exercise - exercise classes 5 days a week    Whole Body Donation at [facility]      [place] [facility]:   Originally from [place]. Has lived in [place], [place], [place], & moved to [place] in 1961. 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 No bird exposure.     [name]'s family history includes Arthritis in her brother and father; Cancer in her brother; Diabetes in her son; Heart disease in her brother, brother, father, and mother; Hypertension in her brother; Lung cancer in her brother. There is no history of Colon  cancer.      Objective:    Filed Vitals:   09/22/15 1402  BP: 90/56  Pulse: 100    Physical Exam  well-developed elderly white female in no acute distress, accompanied by her husband. Blood pressure 90/56 pulse 100 height 5 foot weight 123 and 2 pounds from last office visit 3 weeks ago. Not further examined today discussion only     Assessment & Plan:   #1 82 yo female  With probable achalasia with progressive difficulty with dysphagia and weight loss. Megaesophagus with absent motility and beaked appearance of the distal esophagus documented on recent barium swallow. Questionable benefit from previous Botox injection   #2 polymyalgia rheumatica   #3 sleep apnea #4 HTN #5 hx remote VT  Plan;  discussed options with patient and her husband. She would like to have repeat EGD with Botox injection to see if she gets any benefit. Procedure is scheduled with Dr. Ardis Hughs at [facility]. Risks and benefits discussed and she is agreeable to proceed We also discussed potential PEG  tube lace meant, obviously she would like to avoid this. She is able to swallow liquids better than solids and suggested that she increase boost supplements to at least 3 cans daily, she will also try to work on 5 small feedings per day in order to increase calorie intake and focus on thick liquid to very soft diet with goal of at least maintaining current weight  Tannis Burstein Genia Harold PA-C 09/22/2015   Cc: Estill Dooms, MD

## 2015-10-09 NOTE — Telephone Encounter (Signed)
-----   Message from Milus Banister, MD sent at 10/09/2015 12:21 PM EDT ----- [name], She needs rov with Dr. Silverio Decamp in next 1-2 weeks or so.  Achalasia, discuss pneumatic dilation.  Thanks   [name], Thanks for your help with her.

## 2015-10-10 ENCOUNTER — Encounter (HOSPITAL_COMMUNITY): Payer: Self-pay | Admitting: Gastroenterology

## 2015-10-20 ENCOUNTER — Ambulatory Visit (INDEPENDENT_AMBULATORY_CARE_PROVIDER_SITE_OTHER): Payer: Medicare Other | Admitting: Gastroenterology

## 2015-10-20 ENCOUNTER — Encounter: Payer: Self-pay | Admitting: Gastroenterology

## 2015-10-20 VITALS — BP 74/40 | HR 84 | Ht 60.25 in | Wt 122.0 lb

## 2015-10-20 DIAGNOSIS — K220 Achalasia of cardia: Secondary | ICD-10-CM

## 2015-10-20 DIAGNOSIS — R1310 Dysphagia, unspecified: Secondary | ICD-10-CM

## 2015-10-20 NOTE — Progress Notes (Signed)
[name]    [id number]    Feb 13, 1929  Primary Care Physician:GREEN, Viviann Spare, MD  Referring Physician: Estill Dooms, MD [address] [place], [place] [address]  Chief complaint:  Dysphagia  HPI: 82 year old female with chronic dysphagia, findings of dilated and tortuous esophagus suggestive of achalasia here to discuss management options. She is able to swallow better after EGD with Botox. His currently only on liquid diet, is continued to lose weight. No longer vomiting. Denies any abdominal pain.    Outpatient Encounter Prescriptions as of 10/20/2015  Medication Sig  . acetaminophen (TYLENOL) 325 MG tablet Take 650 mg by mouth every 6 (six) hours as needed for moderate pain (arthritis pain). Reported on 08/18/2015  . ascorbic acid (VITAMIN C) 500 MG tablet Take 500 mg by mouth daily. Reported on 08/26/2015  . b complex vitamins capsule Take 1 capsule by mouth daily.  . Calcium Carbonate (CALCIUM 600 PO) Take 600 mg by mouth daily.  . Cholecalciferol (VITAMIN D-3) 1000 units CAPS Take 1 capsule by mouth daily.  Marland Kitchen ibuprofen (ADVIL,MOTRIN) 200 MG tablet Take 400 mg by mouth daily as needed for headache (headache). Reported on 08/26/2015  . Omega 3 1200 MG CAPS Take 1,200 mg by mouth at bedtime.   . predniSONE (DELTASONE) 1 MG tablet Take 0.5 mg by mouth every morning. To help polymyalgia  . Probiotic Product (PROBIOTIC DAILY PO) Take 1 capsule by mouth daily.   . ranitidine (ZANTAC) 150 MG tablet Take 150 mg by mouth 2 (two) times daily. Take one tablet in morning and one in evening   No facility-administered encounter medications on file as of 10/20/2015.    Allergies as of 10/20/2015 - Review Complete 10/09/2015  Allergen Reaction Noted  . Actonel [risedronate sodium] Other (See Comments) 06/19/2012  . Ivp dye [iodinated diagnostic agents] Nausea And Vomiting 06/19/2012    Past Medical History  Diagnosis Date  . GERD (gastroesophageal reflux disease)   .  Empyema, right ([place]) PULMOLOGIST-- DR CLANCE    VATS 06/23/2012 cultures negative to date CXR 07/19/12 persistent airfluid levels/  CXR 11-01-2012 IMPROVE RIGHT PLEURAL EFFUSION  . Polymyalgia rheumatica ([facility])     on chronic Prednisone 5mg  daily  . Arthritis     back  . Hypertension   . Intrinsic urethral sphincter deficiency   . Megaesophagus   . RBBB   . Chronic steroid use   . History of aspiration pneumonitis     DEC 2013  . Mixed stress and urge urinary incontinence   . Pacemaker MEDTRONIC--  LAST PACER CHECK 05-09-2013  PER PT CHECK OUT OK    CARIOLOGIST--  DR RUTH GREENFIELD AT [facility]--  PT HAS HAD NORMAL STRESS TEST AND ECHO   . Complete heart block ([place])     S/P PACEMAKER 2000 W/ GENERATOR CHANGE 2009  . Normal cardiac stress test     PER PT AT [facility]  . H/O echocardiogram     NORMAL PER PT DONE AT [facility]  . S/P dilatation of esophageal stricture   . Complete heart block ([place])   . OSA on CPAP     cpap, doees not knoe settings  . Anemia     Past Surgical History  Procedure Laterality Date  . Video bronchoscopy  06/23/2012    Procedure: VIDEO BRONCHOSCOPY;  Surgeon: Grace Isaac, MD;  Location: [facility] OR;  Service: Thoracic;  Laterality: N/A;  . Video assisted thoracoscopy (vats)/decortication  06/23/2012    Procedure: VIDEO ASSISTED THORACOSCOPY (VATS)/DECORTICATION;  Surgeon: Grace Isaac, MD;  Location: [facility] OR;  Service: Thoracic;  Laterality: Right;  . Esophagogastroduodenoscopy (egd) with esophageal dilation  06/27/2012    Procedure: ESOPHAGOGASTRODUODENOSCOPY (EGD) WITH ESOPHAGEAL DILATION;  Surgeon: Ladene Artist, MD,FACG;  Location: [facility];  Service: Endoscopy;  Laterality: N/A;  . Video assisted thoracoscopy (vats)/empyema      06/23/2012  . Esophagogastroduodenoscopy  07/24/2012    Procedure: ESOPHAGOGASTRODUODENOSCOPY (EGD);  Surgeon: Inda Castle, MD;  Location: [facility];  Service: Endoscopy;  Laterality: N/A;  . Balloon dilation  07/24/2012     Procedure: BALLOON DILATION;  Surgeon: Inda Castle, MD;  Location: [facility];  Service: Endoscopy;  Laterality: N/A;  . Esophagogastroduodenoscopy  08/07/2012    Procedure: ESOPHAGOGASTRODUODENOSCOPY (EGD);  Surgeon: Inda Castle, MD;  Location: [facility];  Service: Endoscopy;  Laterality: N/A;  . Botox injection  08/07/2012    Procedure: BOTOX INJECTION;  Surgeon: Inda Castle, MD;  Location: [facility];  Service: Endoscopy;  Laterality: N/A;  . Toe surgery  2013    left 3rd toe HAMMERTOE REPAIR  . Total hip arthroplasty Right 2001  . Tonsillectomy  AS CHILD  . Cataract extraction w/ intraocular lens  implant, bilateral  2005  . Vericose vein ligation    . Appendectomy  1953    w/ removal benign kidney tumor   . Botox injection N/A 05/24/2013    Procedure: MACROPLASTIQUE IMPLANT;  Surgeon: Irine Seal, MD;  Location: [facility];  Service: Urology;  Laterality: N/A;  . Joint replacement      right hip, 2001  . Esophagogastroduodenoscopy N/A 02/25/2014    Procedure: ESOPHAGOGASTRODUODENOSCOPY (EGD);  Surgeon: Inda Castle, MD;  Location: [facility];  Service: Endoscopy;  Laterality: N/A;  . Botox injection  02/25/2014    Procedure: BOTOX INJECTION;  Surgeon: Inda Castle, MD;  Location: [facility];  Service: Endoscopy;;  . Pacemaker generator change  12/13/2007    at [facility]  . Cardiac pacemaker placement  06/1999  DR RUTH GREENFIELD AT [facility]     MEDTRONIC  ( LAST PACER CHECK 05-09-2013) for CHB/   END-OF-LIFE GENERATOR CHANGE  2009  . Esophagogastroduodenoscopy (egd) with propofol N/A 10/09/2015    Procedure: ESOPHAGOGASTRODUODENOSCOPY (EGD) WITH PROPOFOL ( WITH BOTOX);  Surgeon: Milus Banister, MD;  Location: [facility];  Service: Endoscopy;  Laterality: N/A;    Family History  Problem Relation Age of Onset  . Arthritis Father   . Heart disease Father   . Cancer Brother     ?spine  . Heart disease Brother   . Lung cancer Brother   . Diabetes  Son   . Heart disease Mother     Congestive heart failure  . Heart disease Brother   . Arthritis Brother   . Hypertension Brother   . Colon cancer Neg Hx     Social History   Social History  . Marital Status: Married    Spouse Name: N/A  . Number of Children: 71  . Years of Education: N/A   Occupational History  . Retired    Social History Main Topics  . Smoking status: Passive Smoke Exposure - Never Smoker  . Smokeless tobacco: Never Used     Comment: Exposure through father only.  . Alcohol Use: No     Comment: None currently  . Drug Use: No  . Sexual Activity: No     Comment: married  Other Topics Concern  . Not on file   Social History Narrative   [facility]ives at [facility] since 05/18/2012   Married   Pacemaker   Living Will   Exercise - exercise classes 5 days a week    Whole Body Donation at [facility]      [place] [facility]:   Originally from [place]. Has lived in [place], [place], [place], & moved to [place] in 1961. 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 No bird exposure.       Review of systems: Review of Systems  Constitutional: Negative for fever and chills.  HENT: Negative.   Eyes: Negative for blurred vision.  Respiratory: Negative for cough, shortness of breath and wheezing.   Cardiovascular: Negative for chest pain and palpitations.  Gastrointestinal: as per HPI Genitourinary: Negative for dysuria, urgency, frequency and hematuria.  Musculoskeletal: Negative for myalgias, back pain and joint pain.  Skin: Negative for itching and rash.  Neurological: Negative for dizziness, tremors, focal weakness, seizures and loss of consciousness.  Endo/Heme/Allergies: Negative for environmental allergies.  Psychiatric/Behavioral: Negative for depression, suicidal ideas and hallucinations.  All other systems reviewed and are negative.   Physical Exam: There were no vitals filed for this visit. Gen:      No acute  distress HEENT:  EOMI, sclera anicteric Neck:     No masses; no thyromegaly Lungs:    Clear to auscultation bilaterally; normal respiratory effort CV:         Regular rate and rhythm; no murmurs Abd:      + bowel sounds; soft, non-tender; no palpable masses, no distension Ext:    2+ edema; adequate peripheral perfusion Skin:      Warm and dry; no rash Neuro: alert and oriented x 3 Psych: normal mood and affect  Data Reviewed: EGD 10/09/15  Dilated tortuous esophagus with large epiphrenic diverticulum, entire esophagus was filled with food and liquid column s/p removal with roth and extensive suctioning was performed with bio vac. Subsequently had a 4 quadrant 100 U Botox injection at EG junction that was snug.   Barium esophagogram 09/17/15  1. Chronic megaesophagus, severe with absent esophageal motility and a beaked appearance of the distal most esophagus. The appearance strongly favors chronic achalasia (primary or due to connective tissue disease, etc). 2. Abundant retained food debris in the proximal thoracic esophagus. No complete obstruction however, with roughly 1/2 to 2/3 of the ingested barium reaching the stomach in a timely fashion.  Assessment and Plan/Recommendations:  82 year old female with chronic dysphagia, findings on barium esophagram and EGD consistent with achalasia. She had an esophageal manometry done at [facility] in 2013 which did show relaxation of lower esophageal sphincter but its possible that the probe never traversed GE junction given the tortuosity of her esophagus and EG junction relaxation was erroneous. Discussed in detail the benefits and risks involved with pneumatic balloon dilation of the esophagus. Also  alternatives of endoscopic procedure(s) have been discussed and reviewed. All questions answered. The patient agrees to proceed with pneumatic balloon dilation. Continue full liquid diet and follow antireflux measures Return after the procedure  K.  Denzil Magnuson , MD [phone] Mon-Fri 8a-5p [phone] after 5p, weekends, holidays

## 2015-10-20 NOTE — Patient Instructions (Addendum)
Your Endoscopy is scheduled at [facility] on 12/03/2015 at 10am, you will need to go for an esophagus study at 1pm at [facility] after your procedure 5/24   Separate instructions have been given

## 2015-10-21 ENCOUNTER — Encounter: Payer: Self-pay | Admitting: Internal Medicine

## 2015-10-21 ENCOUNTER — Other Ambulatory Visit: Payer: Self-pay | Admitting: *Deleted

## 2015-10-21 ENCOUNTER — Ambulatory Visit (INDEPENDENT_AMBULATORY_CARE_PROVIDER_SITE_OTHER): Payer: Medicare Other | Admitting: Internal Medicine

## 2015-10-21 VITALS — BP 125/67 | HR 74 | Ht 60.25 in | Wt 122.0 lb

## 2015-10-21 DIAGNOSIS — I495 Sick sinus syndrome: Secondary | ICD-10-CM | POA: Diagnosis not present

## 2015-10-21 DIAGNOSIS — I442 Atrioventricular block, complete: Secondary | ICD-10-CM | POA: Diagnosis not present

## 2015-10-21 DIAGNOSIS — K220 Achalasia of cardia: Secondary | ICD-10-CM

## 2015-10-21 DIAGNOSIS — Z950 Presence of cardiac pacemaker: Secondary | ICD-10-CM

## 2015-10-21 LAB — CUP PACEART INCLINIC DEVICE CHECK
Battery Impedance: 1780 Ohm
Brady Statistic AP VS Percent: 14 %
Brady Statistic AS VS Percent: 85 %
Date Time Interrogation Session: 20170411113741
Implantable Lead Location: 753859
Implantable Lead Location: 753860
Implantable Lead Model: 5076
Lead Channel Impedance Value: 426 Ohm
Lead Channel Pacing Threshold Amplitude: 0.5 V
Lead Channel Pacing Threshold Amplitude: 0.625 V
Lead Channel Pacing Threshold Amplitude: 0.75 V
Lead Channel Pacing Threshold Pulse Width: 0.4 ms
Lead Channel Pacing Threshold Pulse Width: 0.4 ms
Lead Channel Sensing Intrinsic Amplitude: 1.4 mV
Lead Channel Setting Pacing Pulse Width: 0.4 ms
MDC IDC LEAD IMPLANT DT: 20001227
MDC IDC LEAD IMPLANT DT: 20001227
MDC IDC MSMT BATTERY REMAINING LONGEVITY: 40 mo
MDC IDC MSMT BATTERY VOLTAGE: 2.76 V
MDC IDC MSMT LEADCHNL RA PACING THRESHOLD AMPLITUDE: 0.5 V
MDC IDC MSMT LEADCHNL RA PACING THRESHOLD PULSEWIDTH: 0.4 ms
MDC IDC MSMT LEADCHNL RA PACING THRESHOLD PULSEWIDTH: 0.4 ms
MDC IDC MSMT LEADCHNL RV IMPEDANCE VALUE: 517 Ohm
MDC IDC MSMT LEADCHNL RV SENSING INTR AMPL: 8 mV
MDC IDC SET LEADCHNL RA PACING AMPLITUDE: 2 V
MDC IDC SET LEADCHNL RV PACING AMPLITUDE: 2.5 V
MDC IDC SET LEADCHNL RV SENSING SENSITIVITY: 2.8 mV
MDC IDC STAT BRADY AP VP PERCENT: 0 %
MDC IDC STAT BRADY AS VP PERCENT: 0 %

## 2015-10-21 NOTE — Progress Notes (Signed)
Patient Care Team: Estill Dooms, MD as PCP - General (Internal Medicine) [facility] [name], MD as Consulting Physician (Cardiology) Irine Seal, MD as Attending Physician (Urology) Inda Castle, MD as Consulting Physician (Gastroenterology) Kathee Delton, MD as Consulting Physician (Pulmonary Disease) Grace Isaac, MD as Consulting Physician (Cardiothoracic Surgery)   HPI  [name] is a 82 y.o. female Seen in followup for pacemaker inserted for complete heart block and prior syncope. She underwent pacemaker implantation originally 06/1999 and underwent device generator replacement 5/09.  It is Medtronic device with 5076 leads.    She has hx of achalasia; treated reecently with botox with some improvement; is scheduled for balloon procedure  Office visit yesterday BP was 74/40;  She has no assoc symptoms Denies LH; sob or CP; no syncope   Records and Results Reviewed As above   Past Medical History  Diagnosis Date  . GERD (gastroesophageal reflux disease)   . Empyema, right ([place]) PULMOLOGIST-- DR CLANCE    VATS 06/23/2012 cultures negative to date CXR 07/19/12 persistent airfluid levels/  CXR 11-01-2012 IMPROVE RIGHT PLEURAL EFFUSION  . Polymyalgia rheumatica (HCC)     on chronic Prednisone 5mg  daily  . Arthritis     back  . Hypertension   . Intrinsic urethral sphincter deficiency   . Megaesophagus   . RBBB   . Chronic steroid use   . History of aspiration pneumonitis     DEC 2013  . Mixed stress and urge urinary incontinence   . Pacemaker MEDTRONIC--  LAST PACER CHECK 05-09-2013  PER PT CHECK OUT OK    CARIOLOGIST--  DR RUTH GREENFIELD AT [facility]--  PT HAS HAD NORMAL STRESS TEST AND ECHO   . Complete heart block ([place])     S/P PACEMAKER 2000 W/ GENERATOR CHANGE 2009  . Normal cardiac stress test     PER PT AT [facility]  . H/O echocardiogram     NORMAL PER PT DONE AT [facility]  . S/P dilatation of esophageal stricture   . Complete heart  block ([place])   . OSA on CPAP     cpap, doees not knoe settings  . Anemia     Past Surgical History  Procedure Laterality Date  . Video bronchoscopy  06/23/2012    Procedure: VIDEO BRONCHOSCOPY;  Surgeon: Grace Isaac, MD;  Location: [place];  Service: Thoracic;  Laterality: N/A;  . Video assisted thoracoscopy (vats)/decortication  06/23/2012    Procedure: VIDEO ASSISTED THORACOSCOPY (VATS)/DECORTICATION;  Surgeon: Grace Isaac, MD;  Location: [place];  Service: Thoracic;  Laterality: Right;  . Esophagogastroduodenoscopy (egd) with esophageal dilation  06/27/2012    Procedure: ESOPHAGOGASTRODUODENOSCOPY (EGD) WITH ESOPHAGEAL DILATION;  Surgeon: Ladene Artist, MD,FACG;  Location: [place];  Service: Endoscopy;  Laterality: N/A;  . Video assisted thoracoscopy (vats)/empyema      06/23/2012  . Esophagogastroduodenoscopy  07/24/2012    Procedure: ESOPHAGOGASTRODUODENOSCOPY (EGD);  Surgeon: Inda Castle, MD;  Location[facility];  Service: Endoscopy;  Laterality: N/A;  . Balloon dilation  07/24/2012    Procedure: BALLOON DILATION;  Surgeon: Inda Castle, MD;  Location: [facility];  Service: Endoscopy;  Laterality: N/A;  . Esophagogastroduodenoscopy  08/07/2012    Procedure: ESOPHAGOGASTRODUODENOSCOPY (EGD);  Surgeon: Inda Castle, MD;  Location: [facility];  Service: Endoscopy;  Laterality: N/A;  . Botox injection  08/07/2012    Procedure: BOTOX INJECTION;  Surgeon: Inda Castle, MD;  Location: [facility];  Service: Endoscopy;  Laterality: N/A;  . Toe surgery  2013    left 3rd toe HAMMERTOE REPAIR  . Total hip arthroplasty Right 2001  . Tonsillectomy  AS CHILD  . Cataract extraction w/ intraocular lens  implant, bilateral  2005  . Vericose vein ligation    . Appendectomy  1953    w/ removal benign kidney tumor   . Botox injection N/A 05/24/2013    Procedure: MACROPLASTIQUE IMPLANT;  Surgeon: Irine Seal, MD;  Location: [facility];  Service:  Urology;  Laterality: N/A;  . Joint replacement      right hip, 2001  . Esophagogastroduodenoscopy N/A 02/25/2014    Procedure: ESOPHAGOGASTRODUODENOSCOPY (EGD);  Surgeon: Inda Castle, MD;  Location: [facility];  Service: Endoscopy;  Laterality: N/A;  . Botox injection  02/25/2014    Procedure: BOTOX INJECTION;  Surgeon: Inda Castle, MD;  Location: [facility];  Service: Endoscopy;;  . Pacemaker generator change  12/13/2007    at [facility]  . Cardiac pacemaker placement  06/1999  DR RUTH GREENFIELD AT [facility]     MEDTRONIC  ( LAST PACER CHECK 05-09-2013) for CHB/   END-OF-LIFE GENERATOR CHANGE  2009  . Esophagogastroduodenoscopy (egd) with propofol N/A 10/09/2015    Procedure: ESOPHAGOGASTRODUODENOSCOPY (EGD) WITH PROPOFOL ( WITH BOTOX);  Surgeon: Milus Banister, MD;  Location: [facility];  Service: Endoscopy;  Laterality: N/A;    Current Outpatient Prescriptions  Medication Sig Dispense Refill  . acetaminophen (TYLENOL) 325 MG tablet Take 650 mg by mouth every 6 (six) hours as needed for moderate pain (arthritis pain). Reported on 08/18/2015    . ascorbic acid (VITAMIN C) 500 MG tablet Take 500 mg by mouth daily. Reported on 08/26/2015    . b complex vitamins capsule Take 1 capsule by mouth daily.    . Calcium Carbonate (CALCIUM 600 PO) Take 600 mg by mouth daily.    . Cholecalciferol (VITAMIN D-3) 1000 units CAPS Take 1 capsule by mouth daily.    Marland Kitchen ibuprofen (ADVIL,MOTRIN) 200 MG tablet Take 400 mg by mouth daily as needed for headache (headache). Reported on 08/26/2015    . Omega 3 1200 MG CAPS Take 1,200 mg by mouth at bedtime.     . predniSONE (DELTASONE) 1 MG tablet Take 0.5 mg by mouth every morning. To help polymyalgia    . Probiotic Product (PROBIOTIC DAILY PO) Take 1 capsule by mouth daily.     . ranitidine (ZANTAC) 150 MG tablet Take 150 mg by mouth 2 (two) times daily. Take one tablet in morning and one in evening     No current facility-administered medications for this visit.      Allergies  Allergen Reactions  . Actonel [Risedronate Sodium] Other (See Comments)    Joint aches; rechallenged --caused joint aches  . Ivp Dye [Iodinated Diagnostic Agents] Nausea And Vomiting      Review of Systems negative except from HPI and PMH  Physical Exam BP 125/67 mmHg  Pulse 74  Ht 5' 0.25" (1.53 m)  Wt 122 lb (55.339 kg)  BMI 23.64 kg/m2 Well developed and well nourished in no acute distress HENT normal E scleral and icterus clear Neck Supple JVP flat; carotids brisk and full kyphosis Clear to ausculation  *Regular rate and rhythm, no murmurs gallops or rub Soft with active bowel sounds No clubbing cyanosis  Edema Alert and oriented, grossly normal motor and sensory function Skin Warm and Dry  ECG sinus 74 22/13/40 RBBB   Assessment and  Plan  Complete Heart Block-intermittent  Atrial fibrillation detected by her device  Pacemaker Medtronic The patient's device was interrogated.  The information was reviewed. No changes were made in the programming.     BP well controlled  No syncope  The afib is confirmed by egrams, but the duration for triggering therapy is prob > 6-12 hrs so will follow;  Have reviewed

## 2015-10-21 NOTE — Patient Instructions (Addendum)
Medication Instructions: - Your physician recommends that you continue on your current medications as directed. Please refer to the Current Medication list given to you today.  Labwork: - none  Procedures/Testing: - none  Follow-Up: - Remote monitoring is used to monitor your Pacemaker of ICD from home. This monitoring reduces the number of office visits required to check your device to one time per year. It allows [place] to keep an eye on the functioning of your device to ensure it is working properly. You are scheduled for a device check from home on 01/20/16. You may send your transmission at any time that day. If you have a wireless device, the transmission will be sent automatically. After your physician reviews your transmission, you will receive a postcard with your next transmission date.  - Your physician wants you to follow-up in: 1 year with Dr. Caryl Comes. You will receive a reminder letter in the mail two months in advance. If you don't receive a letter, please call our office to schedule the follow-up appointment.  Any Additional Special Instructions Will Be Listed Below (If Applicable).     If you need a refill on your cardiac medications before your next appointment, please call your pharmacy.

## 2015-11-04 ENCOUNTER — Encounter (HOSPITAL_COMMUNITY): Payer: Self-pay | Admitting: *Deleted

## 2015-11-04 ENCOUNTER — Emergency Department (HOSPITAL_COMMUNITY): Payer: Medicare Other

## 2015-11-04 ENCOUNTER — Emergency Department (HOSPITAL_COMMUNITY)
Admission: EM | Admit: 2015-11-04 | Discharge: 2015-11-05 | Disposition: A | Discharge status: Home / Self Care | Payer: Medicare Other | Attending: Emergency Medicine | Admitting: Emergency Medicine

## 2015-11-04 DIAGNOSIS — Z23 Encounter for immunization: Secondary | ICD-10-CM | POA: Diagnosis not present

## 2015-11-04 DIAGNOSIS — Y998 Other external cause status: Secondary | ICD-10-CM | POA: Insufficient documentation

## 2015-11-04 DIAGNOSIS — S62631B Displaced fracture of distal phalanx of left index finger, initial encounter for open fracture: Secondary | ICD-10-CM | POA: Diagnosis not present

## 2015-11-04 DIAGNOSIS — M47896 Other spondylosis, lumbar region: Secondary | ICD-10-CM | POA: Insufficient documentation

## 2015-11-04 DIAGNOSIS — K219 Gastro-esophageal reflux disease without esophagitis: Secondary | ICD-10-CM | POA: Insufficient documentation

## 2015-11-04 DIAGNOSIS — S62609B Fracture of unspecified phalanx of unspecified finger, initial encounter for open fracture: Secondary | ICD-10-CM

## 2015-11-04 DIAGNOSIS — S61311A Laceration without foreign body of left index finger with damage to nail, initial encounter: Secondary | ICD-10-CM | POA: Diagnosis not present

## 2015-11-04 DIAGNOSIS — Y288XXA Contact with other sharp object, undetermined intent, initial encounter: Secondary | ICD-10-CM | POA: Diagnosis not present

## 2015-11-04 DIAGNOSIS — Z862 Personal history of diseases of the blood and blood-forming organs and certain disorders involving the immune mechanism: Secondary | ICD-10-CM | POA: Diagnosis not present

## 2015-11-04 DIAGNOSIS — Y9389 Activity, other specified: Secondary | ICD-10-CM | POA: Insufficient documentation

## 2015-11-04 DIAGNOSIS — Z950 Presence of cardiac pacemaker: Secondary | ICD-10-CM | POA: Insufficient documentation

## 2015-11-04 DIAGNOSIS — Y9289 Other specified places as the place of occurrence of the external cause: Secondary | ICD-10-CM | POA: Insufficient documentation

## 2015-11-04 DIAGNOSIS — Z7952 Long term (current) use of systemic steroids: Secondary | ICD-10-CM | POA: Insufficient documentation

## 2015-11-04 DIAGNOSIS — G4733 Obstructive sleep apnea (adult) (pediatric): Secondary | ICD-10-CM | POA: Insufficient documentation

## 2015-11-04 DIAGNOSIS — Z8709 Personal history of other diseases of the respiratory system: Secondary | ICD-10-CM | POA: Diagnosis not present

## 2015-11-04 DIAGNOSIS — I10 Essential (primary) hypertension: Secondary | ICD-10-CM | POA: Diagnosis not present

## 2015-11-04 DIAGNOSIS — Z79899 Other long term (current) drug therapy: Secondary | ICD-10-CM | POA: Diagnosis not present

## 2015-11-04 DIAGNOSIS — S6992XA Unspecified injury of left wrist, hand and finger(s), initial encounter: Secondary | ICD-10-CM | POA: Diagnosis present

## 2015-11-04 DIAGNOSIS — S62631A Displaced fracture of distal phalanx of left index finger, initial encounter for closed fracture: Secondary | ICD-10-CM | POA: Diagnosis not present

## 2015-11-04 MED ORDER — LIDOCAINE HCL (PF) 1 % IJ SOLN
10.0000 mL | Freq: Once | INTRAMUSCULAR | Status: DC
  Filled 2015-11-04: qty 10

## 2015-11-04 NOTE — ED Provider Notes (Signed)
By signing my name below, I, Altamease Oiler, attest that this documentation has been prepared under the direction and in the presence of Rolland Porter, MD at 23:51 PM. Electronically Signed: Altamease Oiler, ED Scribe. 11/04/2015. 11:58 PM  [name] is a 82 y.o. right-hand dominant female who presents to the Emergency Department complaining of a laceration at the left first finger tonight. Pt states that she was using an immersion blender and mistakenly turned it on while her hand was near the blade. She is not on a blood thinner. Last tetanus is unknown.   On examination pt is alert and cooperative sitting at the bedside with her finger in saline cup./ Pt  has obvious deformity of the distal portion of the left index finger with the distal phalanx deviated radially and several lacerations on the volar aspect of the finger.         Medical screening examination/treatment/procedure(s) were conducted as a shared visit with non-physician practitioner(s) and myself.  I personally evaluated the patient during the encounter.   Rolland Porter, MD, FACEP  I personally performed the services described in this documentation, which was scribed in my presence. The recorded information has been reviewed and considered.  Rolland Porter, MD, Barbette Or, MD 11/05/15 [phone]

## 2015-11-04 NOTE — ED Provider Notes (Signed)
CSN: [id number]     Arrival date & time 11/04/15  2126 History  By signing my name below, I, Emmanuella Mensah, attest that this documentation has been prepared under the direction and in the presence of Delos Haring, PA-C. Electronically Signed: Judithann Sauger, ED Scribe. 11/04/2015. 11:50 PM.    Chief Complaint  Patient presents with  . Laceration   The history is provided by the patient. No language interpreter was used.   HPI Comments: [name] is a 82 y.o. female who presents to the Emergency Department complaining of an actively bleeding laceration to the left first finger s/p cutting her finger with an immersion stick that occurred at approx. 9pm. She explains that she was grinding meat in a blender (as she is on a pureed diet) when she cut her finger. Pt denies that she is currently on anticoagulants. No alleviating factors noted. Pt has not tried any medications PTA. She is unsure if her tetanus vaccine is UTD. No fever or chills.    Past Medical History  Diagnosis Date  . GERD (gastroesophageal reflux disease)   . Empyema, right ([place]) PULMOLOGIST-- DR CLANCE    VATS 06/23/2012 cultures negative to date CXR 07/19/12 persistent airfluid levels/  CXR 11-01-2012 IMPROVE RIGHT PLEURAL EFFUSION  . Polymyalgia rheumatica (HCC)     on chronic Prednisone 5mg  daily  . Arthritis     back  . Hypertension   . Intrinsic urethral sphincter deficiency   . Megaesophagus   . RBBB   . Chronic steroid use   . History of aspiration pneumonitis     DEC 2013  . Mixed stress and urge urinary incontinence   . Pacemaker MEDTRONIC--  LAST PACER CHECK 05-09-2013  PER PT CHECK OUT OK    CARIOLOGIST--  DR RUTH GREENFIELD AT [facility]--  PT HAS HAD NORMAL STRESS TEST AND ECHO   . Complete heart block ([place])     S/P PACEMAKER 2000 W/ GENERATOR CHANGE 2009  . Normal cardiac stress test     PER PT AT [facility]  . H/O echocardiogram     NORMAL PER PT DONE AT [facility]  . S/P dilatation of esophageal  stricture   . Complete heart block ([place])   . OSA on CPAP     cpap, doees not knoe settings  . Anemia    Past Surgical History  Procedure Laterality Date  . Video bronchoscopy  06/23/2012    Procedure: VIDEO BRONCHOSCOPY;  Surgeon: Grace Isaac, MD;  Location: [place];  Service: Thoracic;  Laterality: N/A;  . Video assisted thoracoscopy (vats)/decortication  06/23/2012    Procedure: VIDEO ASSISTED THORACOSCOPY (VATS)/DECORTICATION;  Surgeon: Grace Isaac, MD;  Location: [place];  Service: Thoracic;  Laterality: Right;  . Esophagogastroduodenoscopy (egd) with esophageal dilation  06/27/2012    Procedure: ESOPHAGOGASTRODUODENOSCOPY (EGD) WITH ESOPHAGEAL DILATION;  Surgeon: Ladene Artist, MD,FACG;  Location: [place];  Service: Endoscopy;  Laterality: N/A;  . Video assisted thoracoscopy (vats)/empyema      06/23/2012  . Esophagogastroduodenoscopy  07/24/2012    Procedure: ESOPHAGOGASTRODUODENOSCOPY (EGD);  Surgeon: Inda Castle, MD;  Location: [facility];  Service: Endoscopy;  Laterality: N/A;  . Balloon dilation  07/24/2012    Procedure: BALLOON DILATION;  Surgeon: Inda Castle, MD;  [facility];  Service: Endoscopy;  Laterality: N/A;  . Esophagogastroduodenoscopy  08/07/2012    Procedure: ESOPHAGOGASTRODUODENOSCOPY (EGD);  Surgeon: Inda Castle, MD;  Location: [facility];  Service: Endoscopy;  Laterality: N/A;  . Botox injection  08/07/2012    Procedure: BOTOX INJECTION;  Surgeon: Inda Castle, MD;  Location[facility];  Service: Endoscopy;  Laterality: N/A;  . Toe surgery  2013    left 3rd toe HAMMERTOE REPAIR  . Total hip arthroplasty Right 2001  . Tonsillectomy  AS CHILD  . Cataract extraction w/ intraocular lens  implant, bilateral  2005  . Vericose vein ligation    . Appendectomy  1953    w/ removal benign kidney tumor   . Botox injection N/A 05/24/2013    Procedure: MACROPLASTIQUE IMPLANT;  Surgeon: Irine Seal, MD;  Location: [facility];  Service: Urology;  Laterality: N/A;  . Joint replacement      right hip, 2001  . Esophagogastroduodenoscopy N/A 02/25/2014    Procedure: ESOPHAGOGASTRODUODENOSCOPY (EGD);  Surgeon: Inda Castle, MD;  Location: [facility];  Service: Endoscopy;  Laterality: N/A;  . Botox injection  02/25/2014    Procedure: BOTOX INJECTION;  Surgeon: Inda Castle, MD;  Location[facility];  Service: Endoscopy;;  . Pacemaker generator change  12/13/2007    at [facility]  . Cardiac pacemaker placement  06/1999  DR RUTH GREENFIELD AT [facility]     MEDTRONIC  ( LAST PACER CHECK 05-09-2013) for CHB/   END-OF-LIFE GENERATOR CHANGE  2009  . Esophagogastroduodenoscopy (egd) with propofol N/A 10/09/2015    Procedure: ESOPHAGOGASTRODUODENOSCOPY (EGD) WITH PROPOFOL ( WITH BOTOX);  Surgeon: Milus Banister, MD;  Location: [facility];  Service: Endoscopy;  Laterality: N/A;   Family History  Problem Relation Age of Onset  . Arthritis Father   . Heart disease Father   . Cancer Brother     ?spine  . Heart disease Brother   . Lung cancer Brother   . Diabetes Son   . Heart disease Mother     Congestive heart failure  . Heart disease Brother   . Arthritis Brother   . Hypertension Brother   . Colon cancer Neg Hx    Social History  Substance Use Topics  . Smoking status: Passive Smoke Exposure - Never Smoker  . Smokeless tobacco: Never Used     Comment: Exposure through father only.  . Alcohol Use: No     Comment: None currently   OB History    No data available     Review of Systems  Constitutional: Negative for fever and chills.  Skin: Positive for wound.      Allergies  Actonel and Ivp dye  Home Medications   Prior to Admission medications   Medication Sig Start Date End Date Taking? Authorizing Provider  acetaminophen (TYLENOL) 325 MG tablet Take 650 mg by mouth every 6 (six) hours as needed for moderate pain (arthritis pain). Reported on 08/18/2015   Yes Historical Provider, MD   ascorbic acid (VITAMIN C) 500 MG tablet Take 500 mg by mouth daily. Reported on 08/26/2015   Yes Historical Provider, MD  b complex vitamins capsule Take 1 capsule by mouth daily.   Yes Historical Provider, MD  Calcium Carbonate (CALCIUM 600 PO) Take 600 mg by mouth daily.   Yes Historical Provider, MD  Cholecalciferol (VITAMIN D-3) 1000 units CAPS Take 1,000 Units by mouth daily.    Yes Historical Provider, MD  ibuprofen (ADVIL,MOTRIN) 200 MG tablet Take 400 mg by mouth daily as needed for headache (headache). Reported on 08/26/2015   Yes Historical Provider, MD  Omega 3 1200 MG CAPS Take 1,200 mg by mouth at bedtime.    Yes Historical Provider, MD  predniSONE (DELTASONE) 1 MG tablet Take 0.5 mg by mouth every morning. To help polymyalgia   Yes Historical Provider, MD  Probiotic Product (PROBIOTIC DAILY PO) Take 1 capsule by mouth daily.    Yes Historical Provider, MD  ranitidine (ZANTAC) 150 MG tablet Take 150 mg by mouth 2 (two) times daily. Take one tablet in morning and one in evening   Yes Historical Provider, MD  Simethicone (PHAZYME PO) Take 1 tablet by mouth daily.   Yes Historical Provider, MD   BP 176/84 mmHg  Pulse 92  Temp(Src) 97.5 F (36.4 C) (Oral)  Resp 16  SpO2 97% Physical Exam  Constitutional: She is oriented to person, place, and time. She appears well-developed and well-nourished. No distress.  HENT:  Head: Normocephalic and atraumatic.  Eyes: Conjunctivae and EOM are normal.  Neck: Neck supple. No tracheal deviation present.  Cardiovascular: Normal rate.   Pulmonary/Chest: Effort normal. No respiratory distress.  Musculoskeletal:       Left hand: She exhibits decreased range of motion, deformity and laceration. She exhibits normal capillary refill.  Neurological: She is alert and oriented to person, place, and time.  Skin: Skin is warm and dry.  deformity of the distal portion of the left index finger with the distal phalanx deviated radially and several  lacerations on the volar aspect of the finger.   Psychiatric: She has a normal mood and affect. Her behavior is normal.  Nursing note and vitals reviewed.   ED Course  Procedures (including critical care time) DIAGNOSTIC STUDIES: Oxygen Saturation is 97% on RA, normal by my interpretation.    COORDINATION OF CARE: 11:25 PM- Pt advised of plan for treatment and pt agrees. Pt informed of her x-ray results. Pt denies any pain medication. Will consult attending for further evaluation.    Labs Review Labs Reviewed - No data to display  Imaging Review Dg Finger Index Left  11/04/2015  CLINICAL DATA:  Injury grinding food. Initial encounter. EXAM: LEFT INDEX FINGER 2+V COMPARISON:  None. FINDINGS: Comminuted fracture of the index finger distal phalanx mainly involving the tuft and shaft. No intra-articular involvement. No dislocation. No opaque foreign body. Osteopenia. IMPRESSION: Comminuted extra-articular distal phalanx fracture. Electronically Signed   By: Monte Fantasia M.D.   On: 11/04/2015 22:04   Delos Haring, PA-C has personally reviewed and evaluated these images and lab results as part of her medical decision-making.   EKG Interpretation None      MDM   Final diagnoses:  Finger fracture, open, initial encounter   12: 43 am Patient with significant distal laceration with associated comminuted fracture. Dr. Tomi Bamberger, [place] saw patient as well and recommends calling [facility] for further recommendations. Dr. Amedeo Plenty has agreed to come to the ER and repair finger injury- family has been updated on plan. Will need Tetanus and abx.  2:00 am - Dr. Amedeo Plenty has seen and repaired patients finger. He has written her for Vicodin and Keflex. She has been given a dose of Tetanus and Keflex in the ED. He has written out instructions for her and return precautions, they have arranged f/u.  Medications  lidocaine (PF) (XYLOCAINE) 1 % injection 10 mL (not administered)  lidocaine (XYLOCAINE) 2 %  (with pres) injection 400 mg (not administered)  Tdap (BOOSTRIX) injection 0.5 mL (not administered)  cephALEXin (KEFLEX) capsule 500 mg (not administered)    I discussed results, diagnoses and plan with [name]. They voice there understanding and questions were answered. We discussed follow-up recommendations and return  precautions.   I personally performed the services described in this documentation, which was scribed in my presence. The recorded information has been reviewed and is accurate.   Delos Haring, PA-C 11/05/15 [id number]  Rolland Porter, MD 11/05/15 [phone]

## 2015-11-04 NOTE — ED Notes (Signed)
Pt states that she was messing with her immersion stick and it came apart and lacerated her left first finger; wound continues to bleed in triage

## 2015-11-05 DIAGNOSIS — S62631B Displaced fracture of distal phalanx of left index finger, initial encounter for open fracture: Secondary | ICD-10-CM | POA: Diagnosis not present

## 2015-11-05 MED ORDER — TETANUS-DIPHTH-ACELL PERTUSSIS 5-2.5-18.5 LF-MCG/0.5 IM SUSP
0.5000 mL | Freq: Once | INTRAMUSCULAR | Status: AC
  Administered 2015-11-05: 0.5 mL via INTRAMUSCULAR
  Filled 2015-11-05: qty 0.5

## 2015-11-05 MED ORDER — LIDOCAINE HCL 2 % IJ SOLN
20.0000 mL | Freq: Once | INTRAMUSCULAR | Status: DC
  Filled 2015-11-05: qty 20

## 2015-11-05 MED ORDER — CEPHALEXIN 500 MG PO CAPS
500.0000 mg | ORAL_CAPSULE | Freq: Once | ORAL | Status: AC
  Administered 2015-11-05: 500 mg via ORAL
  Filled 2015-11-05: qty 1

## 2015-11-05 NOTE — Consult Note (Signed)
Reason for Consult:ER Staff for left index finger open fracture Referring Physician: ER staff  [name] is an 82 y.o. female.  HPI: 82 year old female who has recently began a only pured diet got her left index finger caught in a blender tonight.  She was seen at the home transferred to the hospital around 9 and I was called at midnight to assist in her care  She notes no prior injury to left index finger. She has an open fracture to left index finger with multiple lacerations throughout  She is with her husband she is alert and oriented. She is pleasant. She denies right upper extremity pain complaints she denies lower extremity pain complaints she denies neck or back pain  Past Medical History  Diagnosis Date  . GERD (gastroesophageal reflux disease)   . Empyema, right ([place]) PULMOLOGIST-- DR CLANCE    VATS 06/23/2012 cultures negative to date CXR 07/19/12 persistent airfluid levels/  CXR 11-01-2012 IMPROVE RIGHT PLEURAL EFFUSION  . Polymyalgia rheumatica ([facility])     on chronic Prednisone 5mg  daily  . Arthritis     back  . Hypertension   . Intrinsic urethral sphincter deficiency   . Megaesophagus   . RBBB   . Chronic steroid use   . History of aspiration pneumonitis     DEC 2013  . Mixed stress and urge urinary incontinence   . Pacemaker MEDTRONIC--  LAST PACER CHECK 05-09-2013  PER PT CHECK OUT OK    CARIOLOGIST--  DR RUTH GREENFIELD AT [facility]--  PT HAS HAD NORMAL STRESS TEST AND ECHO   . Complete heart block ([place])     S/P PACEMAKER 2000 W/ GENERATOR CHANGE 2009  . Normal cardiac stress test     PER PT AT [facility]  . H/O echocardiogram     NORMAL PER PT DONE AT [facility]  . S/P dilatation of esophageal stricture   . Complete heart block ([place])   . OSA on CPAP     cpap, doees not knoe settings  . Anemia     Past Surgical History  Procedure Laterality Date  . Video bronchoscopy  06/23/2012    Procedure: VIDEO BRONCHOSCOPY;  Surgeon: Grace Isaac, MD;  Location: [facility];   Service: Thoracic;  Laterality: N/A;  . Video assisted thoracoscopy (vats)/decortication  06/23/2012    Procedure: VIDEO ASSISTED THORACOSCOPY (VATS)/DECORTICATION;  Surgeon: Grace Isaac, MD;  Location: [facility];  Service: Thoracic;  Laterality: Right;  . Esophagogastroduodenoscopy (egd) with esophageal dilation  06/27/2012    Procedure: ESOPHAGOGASTRODUODENOSCOPY (EGD) WITH ESOPHAGEAL DILATION;  Surgeon: Ladene Artist, MD,FACG;  Location: [facility];  Service: Endoscopy;  Laterality: N/A;  . Video assisted thoracoscopy (vats)/empyema      06/23/2012  . Esophagogastroduodenoscopy  07/24/2012    Procedure: ESOPHAGOGASTRODUODENOSCOPY (EGD);  Surgeon: Inda Castle, MD;  Location: [facility];  Service: Endoscopy;  Laterality: N/A;  . Balloon dilation  07/24/2012    Procedure: BALLOON DILATION;  Surgeon: Inda Castle, MD;  Location: [facility];  Service: Endoscopy;  Laterality: N/A;  . Esophagogastroduodenoscopy  08/07/2012    Procedure: ESOPHAGOGASTRODUODENOSCOPY (EGD);  Surgeon: Inda Castle, MD;  Location: [facility];  Service: Endoscopy;  Laterality: N/A;  . Botox injection  08/07/2012    Procedure: BOTOX INJECTION;  Surgeon: Inda Castle, MD;  Location: [facility];  Service: Endoscopy;  Laterality: N/A;  . Toe surgery  2013    left 3rd toe HAMMERTOE REPAIR  . Total hip arthroplasty Right 2001  . Tonsillectomy  AS CHILD  . Cataract extraction w/ intraocular lens  implant, bilateral  2005  . Vericose vein ligation    . Appendectomy  1953    w/ removal benign kidney tumor   . Botox injection N/A 05/24/2013    Procedure: MACROPLASTIQUE IMPLANT;  Surgeon: Irine Seal, MD;  Location: [facility];  Service: Urology;  Laterality: N/A;  . Joint replacement      right hip, 2001  . Esophagogastroduodenoscopy N/A 02/25/2014    Procedure: ESOPHAGOGASTRODUODENOSCOPY (EGD);  Surgeon: Inda Castle, MD;  Location: [facility];  Service: Endoscopy;  Laterality: N/A;   . Botox injection  02/25/2014    Procedure: BOTOX INJECTION;  Surgeon: Inda Castle, MD;  Location: [facility];  Service: Endoscopy;;  . Pacemaker generator change  12/13/2007    at [facility]  . Cardiac pacemaker placement  06/1999  DR RUTH GREENFIELD AT [facility]     MEDTRONIC  ( LAST PACER CHECK 05-09-2013) for CHB/   END-OF-LIFE GENERATOR CHANGE  2009  . Esophagogastroduodenoscopy (egd) with propofol N/A 10/09/2015    Procedure: ESOPHAGOGASTRODUODENOSCOPY (EGD) WITH PROPOFOL ( WITH BOTOX);  Surgeon: Milus Banister, MD;  Location: [facility];  Service: Endoscopy;  Laterality: N/A;    Family History  Problem Relation Age of Onset  . Arthritis Father   . Heart disease Father   . Cancer Brother     ?spine  . Heart disease Brother   . Lung cancer Brother   . Diabetes Son   . Heart disease Mother     Congestive heart failure  . Heart disease Brother   . Arthritis Brother   . Hypertension Brother   . Colon cancer Neg Hx     Social History:  reports that she has been passively smoking.  She has never used smokeless tobacco. She reports that she does not drink alcohol or use illicit drugs.  Allergies:  Allergies  Allergen Reactions  . Actonel [Risedronate Sodium] Other (See Comments)    Joint aches; rechallenged --caused joint aches  . Ivp Dye [Iodinated Diagnostic Agents] Nausea And Vomiting    Medications: I have reviewed the patient's current medications.  No results found for this or any previous visit (from the past 48 hour(s)).  Dg Finger Index Left  11/04/2015  CLINICAL DATA:  Injury grinding food. Initial encounter. EXAM: LEFT INDEX FINGER 2+V COMPARISON:  None. FINDINGS: Comminuted fracture of the index finger distal phalanx mainly involving the tuft and shaft. No intra-articular involvement. No dislocation. No opaque foreign body. Osteopenia. IMPRESSION: Comminuted extra-articular distal phalanx fracture. Electronically Signed   By: Monte Fantasia M.D.   On: 11/04/2015  22:04    Review of Systems  Constitutional: Negative.   HENT: Negative.   Eyes: Negative.   Respiratory: Negative.   Cardiovascular: Negative.        History of pacemaker  Genitourinary: Negative.   Skin: Negative.   Psychiatric/Behavioral: Negative.    Blood pressure 176/84, pulse 92, temperature 97.5 F (36.4 C), temperature source Oral, resp. rate 16, SpO2 97 %. Physical Exam Left index finger multiple lacerations with open fracture and nail plate injury. She has x-rays revealing an open fracture to the distal phalanx. She is no evidence of compartment syndrome or injury proximal to the PIP joint. I reviewed this with her at length and the findings The patient is alert and oriented in no acute distress. The patient complains of pain in the affected upper extremity.  The patient is noted to have a normal  HEENT exam. Lung fields show equal chest expansion and no shortness of breath. Abdomen exam is nontender without distention. Lower extremity examination does not show any fracture dislocation or blood clot symptoms. Pelvis is stable and the neck and back are stable and nontender. Assessment/Plan:  Open left index finger fracture with nail plate injury nail bed injury and opened phalanx fracture.  I've consented her for irrigation debridement and repair.  Patient was taken to the procedure suite and underwent a smooth technique of intermetacarpal block followed by 2% Betadine scrub and pains.  Following this patient underwent nail plate removal. This was performed with Valora Corporal following sterile fields being secured and nail plate being removed we then performed irrigation debridement with scalpel and scissor and curette of the scans obtained tissue and bone as well as nail bed tissue. This was an excisional debridement.  Following this patient underwent him setting and open treatment of the distal phalanx fracture.  Following this with Careful dissection I performed multiple  laceration repairs with 5-0 chromic.  Following this we performed multiple sutures in the nailbed. This is nailbed without difficulty. Following this tourniquet was deflated Adaptic was placed under the eponychial fold and the finger had fairly decent refill despite the mutilating injury with a blender.  Patient was instructed on postop care.  Thus the patient underwent #1 irrigation debridement open fracture #2 open treatment distal phalanx fracture #3 nail plate removal #4 nailbed repair #5 multiple lacerations repaired simple in nature with chromic less than 10 cm  Should be discharged on Keflex 500 mg 1 by mouth 4 times a day 10 days and Norco when necessary pain 1-2 every 4-6 hours when necessary pain by mouth  I've instructed her on postop care plans.  Keep bandage clean and dry.  Call for any problems.  No smoking.  Criteria for driving a car: you should be off your pain medicine for 7-8 hours, able to drive one handed(confident), thinking clearly and feeling able in your judgement to drive. Continue elevation as it will decrease swelling.  If instructed by MD move your fingers within the confines of the bandage/splint.  Use ice if instructed by your MD. Call immediately for any sudden loss of feeling in your hand/arm or change in functional abilities of the extremity.We recommend that you to take vitamin C 1000 mg a day to promote healing. We also recommend that if you require  pain medicine that you take a stool softener to prevent constipation as most pain medicines will have constipation side effects. We recommend either Peri-Colace or Senokot and recommend that you also consider adding MiraLAX as well to prevent the constipation affects from pain medicine if you are required to use them. These medicines are over the counter and may be purchased at a local pharmacy. A cup of yogurt and a probiotic can also be helpful during the recovery process as the medicines can disrupt your intestinal  environment.  Chandy Tarman III,Anselma Herbel M 11/05/2015, 1:45 AM

## 2015-11-05 NOTE — Discharge Instructions (Signed)
Please follow Dr. Carmelia Bake instructions

## 2015-11-19 ENCOUNTER — Ambulatory Visit: Payer: Medicare Other

## 2015-11-19 ENCOUNTER — Ambulatory Visit: Payer: Medicare Other | Admitting: Pulmonary Disease

## 2015-11-20 DIAGNOSIS — R938 Abnormal findings on diagnostic imaging of other specified body structures: Secondary | ICD-10-CM | POA: Diagnosis not present

## 2015-11-20 DIAGNOSIS — I10 Essential (primary) hypertension: Secondary | ICD-10-CM | POA: Diagnosis not present

## 2015-11-20 LAB — BASIC METABOLIC PANEL
BUN: 23 mg/dL — AB (ref 4–21)
CREATININE: 0.6 mg/dL (ref 0.5–1.1)
Glucose: 99 mg/dL
POTASSIUM: 4.1 mmol/L (ref 3.4–5.3)
SODIUM: 138 mmol/L (ref 137–147)

## 2015-11-20 LAB — TSH: TSH: 0.84 u[IU]/mL (ref 0.41–5.90)

## 2015-11-20 LAB — HEPATIC FUNCTION PANEL
ALK PHOS: 70 U/L (ref 25–125)
ALT: 8 U/L (ref 7–35)
AST: 17 U/L (ref 13–35)
Bilirubin, Total: 0.5 mg/dL

## 2015-11-21 ENCOUNTER — Encounter: Payer: Self-pay | Admitting: *Deleted

## 2015-11-21 DIAGNOSIS — Z4789 Encounter for other orthopedic aftercare: Secondary | ICD-10-CM | POA: Diagnosis not present

## 2015-11-21 DIAGNOSIS — S68012D Complete traumatic metacarpophalangeal amputation of left thumb, subsequent encounter: Secondary | ICD-10-CM | POA: Diagnosis not present

## 2015-11-25 ENCOUNTER — Encounter: Payer: Self-pay | Admitting: Internal Medicine

## 2015-11-25 ENCOUNTER — Non-Acute Institutional Stay: Payer: Medicare Other | Admitting: Internal Medicine

## 2015-11-25 VITALS — BP 126/64 | HR 77 | Temp 97.4°F | Ht 60.0 in | Wt 125.0 lb

## 2015-11-25 DIAGNOSIS — E042 Nontoxic multinodular goiter: Secondary | ICD-10-CM | POA: Diagnosis not present

## 2015-11-25 DIAGNOSIS — I10 Essential (primary) hypertension: Secondary | ICD-10-CM | POA: Diagnosis not present

## 2015-11-25 DIAGNOSIS — S61209A Unspecified open wound of unspecified finger without damage to nail, initial encounter: Secondary | ICD-10-CM | POA: Insufficient documentation

## 2015-11-25 DIAGNOSIS — R609 Edema, unspecified: Secondary | ICD-10-CM | POA: Diagnosis not present

## 2015-11-25 DIAGNOSIS — S62209A Unspecified fracture of first metacarpal bone, unspecified hand, initial encounter for closed fracture: Secondary | ICD-10-CM | POA: Diagnosis not present

## 2015-11-25 DIAGNOSIS — K220 Achalasia of cardia: Secondary | ICD-10-CM | POA: Diagnosis not present

## 2015-11-25 DIAGNOSIS — R739 Hyperglycemia, unspecified: Secondary | ICD-10-CM

## 2015-11-25 NOTE — Progress Notes (Signed)
Patient ID: [name], female   DOB: 07-09-1929, 82 y.o.   MRN: [id number]    [facility]     Place of Service: Clinic (12)     Allergies  Allergen Reactions  . Actonel [Risedronate Sodium] Other (See Comments)    Joint aches; rechallenged --caused joint aches  . Ivp Dye [Iodinated Diagnostic Agents] Nausea And Vomiting    Chief Complaint  Patient presents with  . Medical Management of Chronic Issues    3 month medication management blood pressure  . Hospitalization Follow-up    11/04/15 to ED for laceration left first finger, cut on blade of blender, fracture distal phalanx.    HPI:  Laceration and fracture of the left index finger on 11/04/15. Seeing Dr. Sudie Bailey.  EGD scheduled for 12/03/15. Hx achalasia and distal Esophageal stenosis. Patient says she is to be dilated during this procedure.  Some episodes of reflux esophagitis, but generally this is well-controlled with the ranitidine that she takes.  Patient tried stopping the prednisone for polymyalgia rheumatica, began getting uncomfortable all over so she resumed at a very low dose.  Denies any palpitations or rapid heartbeat.  Asks if there is anything she should do about her goiter in the right lobe of the thyroid. She does not want surgery.   Medications: Patient's Medications  New Prescriptions   No medications on file  Previous Medications   ACETAMINOPHEN (TYLENOL) 325 MG TABLET    Take 650 mg by mouth every 6 (six) hours as needed for moderate pain (arthritis pain). Reported on 08/18/2015   ASCORBIC ACID (VITAMIN C) 500 MG TABLET    Take 500 mg by mouth daily. Reported on 08/26/2015   B COMPLEX VITAMINS CAPSULE    Take 1 capsule by mouth daily.   CALCIUM CARBONATE (CALCIUM 600 PO)    Take 600 mg by mouth daily.   CHOLECALCIFEROL (VITAMIN D-3) 1000 UNITS CAPS    Take 1,000 Units by mouth daily.    IBUPROFEN (ADVIL,MOTRIN) 200 MG TABLET    Take 400 mg by mouth daily as needed for headache  (headache). Reported on 08/26/2015   OMEGA 3 1200 MG CAPS    Take 1,200 mg by mouth at bedtime.    PREDNISONE (DELTASONE) 1 MG TABLET    Take 0.5 mg by mouth every morning. To help polymyalgia   PROBIOTIC PRODUCT (PROBIOTIC DAILY PO)    Take 1 capsule by mouth daily.    RANITIDINE (ZANTAC) 150 MG TABLET    Take 150 mg by mouth 2 (two) times daily. Take one tablet in morning and one in evening   SIMETHICONE (PHAZYME PO)    Take 1 tablet by mouth daily.  Modified Medications   No medications on file  Discontinued Medications   No medications on file     Review of Systems  Constitutional: Negative for fever, activity change, appetite change and fatigue.       Mildly overweight.  Eyes: Negative.   Respiratory:       Aspiration pneumonia December 3382 complicated by subsequent empyema. Appears fully recovered as of 07/17/13.  Cardiovascular: Negative for chest pain, palpitations and leg swelling.  Gastrointestinal:       Known achalasia and megaesophagus. Some dysphagia.   Endocrine: Negative.   Genitourinary:       Chronic urinary leakage. Wears protective pads for at least 10 years. She has seen urologist in the past. Sling surgery was proposed, but she did not want to go through this.  Musculoskeletal: Positive for myalgias. Negative for back pain and gait problem.       Diffuse muscular pains, worse in the shoulders.  Skin: Negative.   Allergic/Immunologic: Negative.   Neurological: Negative.   Hematological:       History of anemia.  Psychiatric/Behavioral: Negative.     Filed Vitals:   11/25/15 0941  BP: 126/64  Pulse: 77  Temp: 97.4 F (36.3 C)  TempSrc: Oral  Height: 5' (1.524 m)  Weight: 125 lb (56.7 kg)  SpO2: 97%   Wt Readings from Last 3 Encounters:  11/25/15 125 lb (56.7 kg)  10/21/15 122 lb (55.339 kg)  10/20/15 122 lb (55.339 kg)    Body mass index is 24.41 kg/(m^2).  Physical Exam  Constitutional: She is oriented to person, place, and time. She appears  well-developed and well-nourished. No distress.  HENT:  Head: Normocephalic and atraumatic.  Right Ear: External ear normal.  Left Ear: External ear normal.  Nose: Nose normal.  Mouth/Throat: Oropharynx is clear and moist.  Eyes:  Corrective lenses worn.  Neck: Thyromegaly present.  Multinodular goiter with nodules palpable in all 4 lobes. Cyst in the right upper lobe of the thyroid is particularly large.  Cardiovascular: Normal rate, regular rhythm and intact distal pulses.  Exam reveals friction rub. Exam reveals no gallop.   No murmur heard. Pulmonary/Chest: Effort normal and breath sounds normal. No respiratory distress. She has no wheezes. She has no rales. She exhibits no tenderness.  Abdominal: Soft. Bowel sounds are normal. She exhibits no distension and no mass. There is no tenderness.  Musculoskeletal: Normal range of motion. She exhibits no edema or tenderness.  Left leg is shorter than the right. Left 2nd and 4th hammer toes. Left 3rd toe had corrective surgery for hammer toe.  Neurological: She is alert and oriented to person, place, and time. She has normal reflexes. No cranial nerve deficit. Coordination normal.  Skin: No rash noted. No erythema.  Psychiatric: She has a normal mood and affect. Her behavior is normal. Judgment and thought content normal.     Labs reviewed: Lab Summary Latest Ref Rng 11/20/2015 03/31/2015  Hemoglobin 13.0-17.0 g/dL (None) (None)  Hematocrit 39.0-52.0 % (None) (None)  White count - (None) (None)  Platelet count - (None) (None)  Sodium 137 - 147 mmol/L 138 143  Potassium 3.4 - 5.3 mmol/L 4.1 3.9  Calcium - (None) (None)  Phosphorus - (None) (None)  Creatinine 0.5 - 1.1 mg/dL 0.6 0.6  AST 13 - 35 U/L 17 15  Alk Phos 25 - 125 U/L 70 76  Bilirubin - (None) (None)  Glucose - 99 75  Cholesterol - (None) (None)  HDL cholesterol - (None) (None)  Triglycerides - (None) (None)  LDL Direct - (None) (None)  LDL Calc - (None) (None)  Total  protein - (None) (None)  Albumin - (None) (None)   Lab Results  Component Value Date   TSH 0.84 11/20/2015   Lab Results  Component Value Date   BUN 23* 11/20/2015   BUN 16 03/31/2015   BUN 15 09/23/2014   Lab Results  Component Value Date   CREATININE 0.6 11/20/2015   CREATININE 0.6 03/31/2015   CREATININE 0.7 09/23/2014   Lab Results  Component Value Date   HGBA1C 5.5 09/23/2014   HGBA1C 5.5 11/05/2013       Assessment/Plan  1. Wound, open, finger, initial encounter Continue to see Dr. Sudie Bailey  2. Achalasia of esophagus EGD and dilation as planned  3. Edema,  unspecified type unchanged  4. Essential hypertension controlled  5. Hyperglycemia Controlled. Remains on a low dose of prednisone for her PMR.  6. Multinodular thyroid Continue to observe. Not tender.

## 2015-11-27 DIAGNOSIS — S68012D Complete traumatic metacarpophalangeal amputation of left thumb, subsequent encounter: Secondary | ICD-10-CM | POA: Diagnosis not present

## 2015-12-02 ENCOUNTER — Encounter (HOSPITAL_COMMUNITY): Payer: Self-pay | Admitting: *Deleted

## 2015-12-02 NOTE — Progress Notes (Signed)
Pt denies SOB and chest pain. Pt made aware to stop taking Aspirin, vitamins, fish oil and herbal medications. Do not take any NSAIDs ie: Ibuprofen, Advil, Naproxen, BC and Goody Powder or any medication containing Aspirin. Pt stated that she was instructed to nat take any medication on the DOS. Pt verbalized understanding of all pre-op instructions.

## 2015-12-03 ENCOUNTER — Encounter (HOSPITAL_COMMUNITY)
Admission: RE | Disposition: A | Discharge status: Home / Self Care | Payer: Self-pay | Source: Ambulatory Visit | Attending: Gastroenterology

## 2015-12-03 ENCOUNTER — Ambulatory Visit (HOSPITAL_COMMUNITY): Payer: Medicare Other | Admitting: Certified Registered Nurse Anesthetist

## 2015-12-03 ENCOUNTER — Ambulatory Visit (HOSPITAL_COMMUNITY)
Admission: RE | Admit: 2015-12-03 | Discharge: 2015-12-03 | Disposition: A | Discharge status: Home / Self Care | Payer: Medicare Other | Source: Ambulatory Visit | Attending: Gastroenterology | Admitting: Gastroenterology

## 2015-12-03 ENCOUNTER — Encounter (HOSPITAL_COMMUNITY): Payer: Self-pay | Admitting: Certified Registered Nurse Anesthetist

## 2015-12-03 ENCOUNTER — Ambulatory Visit (HOSPITAL_COMMUNITY): Payer: Medicare Other

## 2015-12-03 DIAGNOSIS — Z96641 Presence of right artificial hip joint: Secondary | ICD-10-CM | POA: Insufficient documentation

## 2015-12-03 DIAGNOSIS — Z888 Allergy status to other drugs, medicaments and biological substances status: Secondary | ICD-10-CM | POA: Insufficient documentation

## 2015-12-03 DIAGNOSIS — K2971 Gastritis, unspecified, with bleeding: Secondary | ICD-10-CM | POA: Diagnosis not present

## 2015-12-03 DIAGNOSIS — G4733 Obstructive sleep apnea (adult) (pediatric): Secondary | ICD-10-CM | POA: Diagnosis not present

## 2015-12-03 DIAGNOSIS — Z950 Presence of cardiac pacemaker: Secondary | ICD-10-CM | POA: Diagnosis not present

## 2015-12-03 DIAGNOSIS — I4510 Unspecified right bundle-branch block: Secondary | ICD-10-CM | POA: Diagnosis not present

## 2015-12-03 DIAGNOSIS — I499 Cardiac arrhythmia, unspecified: Secondary | ICD-10-CM | POA: Diagnosis not present

## 2015-12-03 DIAGNOSIS — R1310 Dysphagia, unspecified: Secondary | ICD-10-CM | POA: Diagnosis not present

## 2015-12-03 DIAGNOSIS — K225 Diverticulum of esophagus, acquired: Secondary | ICD-10-CM | POA: Insufficient documentation

## 2015-12-03 DIAGNOSIS — K219 Gastro-esophageal reflux disease without esophagitis: Secondary | ICD-10-CM | POA: Insufficient documentation

## 2015-12-03 DIAGNOSIS — K220 Achalasia of cardia: Secondary | ICD-10-CM | POA: Insufficient documentation

## 2015-12-03 DIAGNOSIS — Z79899 Other long term (current) drug therapy: Secondary | ICD-10-CM | POA: Diagnosis not present

## 2015-12-03 DIAGNOSIS — K228 Other specified diseases of esophagus: Secondary | ICD-10-CM | POA: Diagnosis not present

## 2015-12-03 DIAGNOSIS — T18128A Food in esophagus causing other injury, initial encounter: Secondary | ICD-10-CM | POA: Diagnosis not present

## 2015-12-03 DIAGNOSIS — I10 Essential (primary) hypertension: Secondary | ICD-10-CM | POA: Diagnosis not present

## 2015-12-03 DIAGNOSIS — Z91041 Radiographic dye allergy status: Secondary | ICD-10-CM | POA: Insufficient documentation

## 2015-12-03 HISTORY — DX: Unilateral inguinal hernia, without obstruction or gangrene, not specified as recurrent: K40.90

## 2015-12-03 HISTORY — PX: ESOPHAGOGASTRODUODENOSCOPY (EGD) WITH PROPOFOL: SHX5813

## 2015-12-03 HISTORY — DX: Achalasia of cardia: K22.0

## 2015-12-03 HISTORY — PX: BALLOON DILATION: SHX5330

## 2015-12-03 SURGERY — ESOPHAGOGASTRODUODENOSCOPY (EGD) WITH PROPOFOL
Anesthesia: Monitor Anesthesia Care

## 2015-12-03 MED ORDER — PROPOFOL 10 MG/ML IV BOLUS
INTRAVENOUS | Status: DC | PRN
  Administered 2015-12-03: 100 mg via INTRAVENOUS

## 2015-12-03 MED ORDER — DIATRIZOATE MEGLUMINE & SODIUM 66-10 % PO SOLN
90.0000 mL | Freq: Once | ORAL | Status: AC
  Administered 2015-12-03: 90 mL via ORAL

## 2015-12-03 MED ORDER — SODIUM CHLORIDE 0.9 % IV SOLN: INTRAVENOUS | Status: DC

## 2015-12-03 MED ORDER — LIDOCAINE HCL (CARDIAC) 20 MG/ML IV SOLN
INTRAVENOUS | Status: DC | PRN
  Administered 2015-12-03: 60 mg via INTRAVENOUS

## 2015-12-03 MED ORDER — DIATRIZOATE MEGLUMINE & SODIUM 66-10 % PO SOLN: 90.0000 mL | Freq: Once | ORAL | Status: DC

## 2015-12-03 MED ORDER — SUCCINYLCHOLINE CHLORIDE 20 MG/ML IJ SOLN
INTRAMUSCULAR | Status: DC | PRN
  Administered 2015-12-03: 80 mg via INTRAVENOUS

## 2015-12-03 MED ORDER — DIATRIZOATE MEGLUMINE & SODIUM 66-10 % PO SOLN
ORAL | Status: AC
  Filled 2015-12-03: qty 90

## 2015-12-03 MED ORDER — FENTANYL CITRATE (PF) 100 MCG/2ML IJ SOLN: 25.0000 ug | INTRAMUSCULAR | Status: DC | PRN

## 2015-12-03 MED ORDER — GLYCOPYRROLATE 0.2 MG/ML IJ SOLN
INTRAMUSCULAR | Status: DC | PRN
  Administered 2015-12-03: .2 mg via INTRAVENOUS

## 2015-12-03 MED ORDER — ONDANSETRON HCL 4 MG/2ML IJ SOLN
INTRAMUSCULAR | Status: DC | PRN
  Administered 2015-12-03: 4 mg via INTRAVENOUS

## 2015-12-03 MED ORDER — LACTATED RINGERS IV SOLN
INTRAVENOUS | Status: DC
  Administered 2015-12-03: 1000 mL via INTRAVENOUS

## 2015-12-03 MED ORDER — FENTANYL CITRATE (PF) 100 MCG/2ML IJ SOLN
INTRAMUSCULAR | Status: DC | PRN
  Administered 2015-12-03: 50 ug via INTRAVENOUS

## 2015-12-03 MED ORDER — IOPAMIDOL (ISOVUE-300) INJECTION 61%
INTRAVENOUS | Status: AC
  Filled 2015-12-03: qty 50

## 2015-12-03 NOTE — Discharge Instructions (Signed)
Esophagogastroduodenoscopy, Care After Refer to this sheet in the next few weeks. These instructions provide you with information about caring for yourself after your procedure. Your health care provider may also give you more specific instructions. Your treatment has been planned according to current medical practices, but problems sometimes occur. Call your health care provider if you have any problems or questions after your procedure. WHAT TO EXPECT AFTER THE PROCEDURE After your procedure, it is typical to feel:  Soreness in your throat.  Pain with swallowing.  Sick to your stomach (nauseous).  Bloated.  Dizzy.  Fatigued. HOME CARE INSTRUCTIONS  Do not eat or drink anything until the numbing medicine (local anesthetic) has worn off and your gag reflex has returned. You will know that the local anesthetic has worn off when you can swallow comfortably.  Do not drive or operate machinery until directed by your health care provider.  Take medicines only as directed by your health care provider. SEEK MEDICAL CARE IF:   You cannot stop coughing.  You are not urinating at all or less than usual. SEEK IMMEDIATE MEDICAL CARE IF:  You have difficulty swallowing.  You cannot eat or drink.  You have worsening throat or chest pain.  You have dizziness or lightheadedness or you faint.  You have nausea or vomiting.  You have chills.  You have a fever.  You have severe abdominal pain.  You have black, tarry, or bloody stools.   This information is not intended to replace advice given to you by your health care provider. Make sure you discuss any questions you have with your health care provider.   Document Released: 06/14/2012 Document Revised: 07/19/2014 Document Reviewed: 06/14/2012 Elsevier Interactive Patient Education 2016 Else[employer]   Esophageal Dilatation Esophageal dilatation is a procedure to open a blocked or narrowed part of the esophagus. The esophagus is  the long tube in your throat that carries food and liquid from your mouth to your stomach. The procedure is also called esophageal dilation.  You may need this procedure if you have a buildup of scar tissue in your esophagus that makes it difficult, painful, or even impossible to swallow. This can be caused by gastroesophageal reflux disease (GERD). In rare cases, people need this procedure because they have cancer of the esophagus or a problem with the way food moves through the esophagus. Sometimes you may need to have another dilatation to enlarge the opening of the esophagus gradually. LET [facility] CARE PROVIDER KNOW ABOUT:   Any allergies you have.  All medicines you are taking, including vitamins, herbs, eye drops, creams, and over-the-counter medicines.  Previous problems you or members of your family have had with the use of anesthetics.  Any blood disorders you have.  Previous surgeries you have had.  Medical conditions you have.  Any antibiotic medicines you are required to take before dental procedures. RISKS AND COMPLICATIONS Generally, this is a safe procedure. However, problems can occur and include:  Bleeding from a tear in the lining of the esophagus.  A hole (perforation) in the esophagus. BEFORE THE PROCEDURE  Do not eat or drink anything after midnight on the night before the procedure or as directed by your health care provider.  Ask your health care provider about changing or stopping your regular medicines. This is especially important if you are taking diabetes medicines or blood thinners.  Plan to have someone take you home after the procedure. PROCEDURE   You will be given a medicine that  makes you relaxed and sleepy (sedative).  A medicine may be sprayed or gargled to numb the back of the throat.  Your health care provider can use various instruments to do an esophageal dilatation. During the procedure, the instrument used will be placed in your mouth  and passed down into your esophagus. Options include:  Simple dilators. This instrument is carefully placed in the esophagus to stretch it.  Guided wire bougies. In this method, a flexible tube (endoscope) is used to insert a wire into the esophagus. The dilator is passed over this wire to enlarge the esophagus. Then the wire is removed.  Balloon dilators. An endoscope with a small balloon at the end is passed down into the esophagus. Inflating the balloon gently stretches the esophagus and opens it up. AFTER THE PROCEDURE  Your blood pressure, heart rate, breathing rate, and blood oxygen level will be monitored often until the medicines you were given have worn off.  Your throat may feel slightly sore and will probably still feel numb. This will improve slowly over time.  You will not be allowed to eat or drink until the throat numbness has resolved.  If this is a same-day procedure, you may be allowed to go home once you have been able to drink, urinate, and sit on the edge of the bed without nausea or dizziness.  If this is a same-day procedure, you should have a friend or family member with you for the next 24 hours after the procedure.   This information is not intended to replace advice given to you by your health care provider. Make sure you discuss any questions you have with your health care provider.   Document Released: 08/19/2005 Document Revised: 07/19/2014 Document Reviewed: 11/07/2013 Elsevier Interactive Patient Education 2016 [place] Anesthesia, Adult, Care After Refer to this sheet in the next few weeks. These instructions provide you with information on caring for yourself after your procedure. Your health care provider may also give you more specific instructions. Your treatment has been planned according to current medical practices, but problems sometimes occur. Call your health care provider if you have any problems or questions after your  procedure. WHAT TO EXPECT AFTER THE PROCEDURE After the procedure, it is typical to experience:  Sleepiness.  Nausea and vomiting. HOME CARE INSTRUCTIONS  For the first 24 hours after general anesthesia:  Have a responsible person with you.  Do not drive a car. If you are alone, do not take public transportation.  Do not drink alcohol.  Do not take medicine that has not been prescribed by your health care provider.  Do not sign important papers or make important decisions.  You may resume a normal diet and activities as directed by your health care provider.  Change bandages (dressings) as directed.  If you have questions or problems that seem related to general anesthesia, call the hospital and ask for the anesthetist or anesthesiologist on call. SEEK MEDICAL CARE IF:  You have nausea and vomiting that continue the day after anesthesia.  You develop a rash. SEEK IMMEDIATE MEDICAL CARE IF:   You have difficulty breathing.  You have chest pain.  You have any allergic problems.   This information is not intended to replace advice given to you by your health care provider. Make sure you discuss any questions you have with your health care provider.   Document Released: 10/04/2000 Document Revised: 07/19/2014 Document Reviewed: 10/27/2011 Elsevier Interactive Patient Education [employer].

## 2015-12-03 NOTE — Transfer of Care (Signed)
Immediate Anesthesia Transfer of Care Note  Patient: [name]  Procedure(s) Performed: Procedure(s) with comments: ESOPHAGOGASTRODUODENOSCOPY (EGD) WITH PROPOFOL (N/A) BALLOON DILATION (N/A) - pnuematic balloon  Patient Location: Endoscopy Unit  Anesthesia Type:General  Level of Consciousness: awake, alert , oriented and patient cooperative  Airway & Oxygen Therapy: Patient Spontanous Breathing and Patient connected to nasal cannula oxygen  Post-op Assessment: Report given to RN and Post -op Vital signs reviewed and stable  Post vital signs: Reviewed and stable  Last Vitals:  Filed Vitals:   12/03/15 0900  BP: 160/76  Pulse: 72  Temp: 36.5 C  Resp: 17    Last Pain: There were no vitals filed for this visit.       Complications: No apparent anesthesia complications

## 2015-12-03 NOTE — Anesthesia Procedure Notes (Signed)
Procedure Name: Intubation Date/Time: 12/03/2015 10:17 AM Performed by: Salli Quarry Eldean Nanna Pre-anesthesia Checklist: Patient identified, Emergency Drugs available, Suction available and Patient being monitored Patient Re-evaluated:Patient Re-evaluated prior to inductionOxygen Delivery Method: Circle System Utilized Preoxygenation: Pre-oxygenation with 100% oxygen Intubation Type: IV induction Ventilation: Mask ventilation without difficulty Tube type: Oral Tube size: 7.0 mm Number of attempts: 1 Airway Equipment and Method: Stylet Placement Confirmation: ETT inserted through vocal cords under direct vision,  positive ETCO2 and breath sounds checked- equal and bilateral Secured at: 21 cm Tube secured with: Tape Dental Injury: Teeth and Oropharynx as per pre-operative assessment

## 2015-12-03 NOTE — Anesthesia Preprocedure Evaluation (Addendum)
Anesthesia Evaluation  Patient identified by MRN, date of birth, ID band Patient awake    Reviewed: Allergy & Precautions, NPO status , Patient's Chart, lab work & pertinent test results  Airway Mallampati: II  TM Distance: >3 FB Neck ROM: Full    Dental   Pulmonary sleep apnea ,    breath sounds clear to auscultation       Cardiovascular hypertension, + dysrhythmias + pacemaker  Rhythm:Regular Rate:Normal     Neuro/Psych    GI/Hepatic Neg liver ROS, GERD  ,  Endo/Other  negative endocrine ROS  Renal/GU negative Renal ROS     Musculoskeletal   Abdominal   Peds  Hematology   Anesthesia Other Findings   Reproductive/Obstetrics                            Anesthesia Physical Anesthesia Plan  ASA: III  Anesthesia Plan: MAC   Post-op Pain Management:    Induction: Intravenous  Airway Management Planned: Simple Face Mask  Additional Equipment:   Intra-op Plan:   Post-operative Plan:   Informed Consent: I have reviewed the patients History and Physical, chart, labs and discussed the procedure including the risks, benefits and alternatives for the proposed anesthesia with the patient or authorized representative who has indicated his/her understanding and acceptance.   Dental advisory given  Plan Discussed with: CRNA and Anesthesiologist  Anesthesia Plan Comments:         Anesthesia Quick Evaluation

## 2015-12-03 NOTE — Anesthesia Postprocedure Evaluation (Signed)
Anesthesia Post Note  Patient: SEIRRA GUTIERRES  Procedure(s) Performed: Procedure(s) (LRB): ESOPHAGOGASTRODUODENOSCOPY (EGD) WITH PROPOFOL (N/A) BALLOON DILATION (N/A)  Patient location during evaluation: Endoscopy Anesthesia Type: MAC Pain management: pain level controlled Vital Signs Assessment: post-procedure vital signs reviewed and stable Respiratory status: spontaneous breathing Cardiovascular status: stable Anesthetic complications: no    Last Vitals:  Filed Vitals:   12/03/15 1315 12/03/15 1320  BP: 158/104   Pulse: 68 65  Temp:    Resp: 18 17    Last Pain: There were no vitals filed for this visit.               EDWARDS,[place]ato [place]

## 2015-12-03 NOTE — H&P (Addendum)
[facility] History and Physical   Primary Care Physician:  Estill Dooms, MD   Reason for Procedure:  Achalasia  Plan:    EGD with pneumatic balloon dilation under fluoroscopy    HPI: [name] is a 82 y.o. female with chronic dysphagia and recent diagnosis of achalasia is here for EGD with pneumatic balloon dilation. The risks and benefits as well as alternatives of endoscopic procedure(s) have been discussed and reviewed. All questions answered. The patient agrees to proceed.    Past Medical History  Diagnosis Date  . GERD (gastroesophageal reflux disease)   . Empyema, right ([place]) PULMOLOGIST-- DR CLANCE    VATS 06/23/2012 cultures negative to date CXR 07/19/12 persistent airfluid levels/  CXR 11-01-2012 IMPROVE RIGHT PLEURAL EFFUSION  . Polymyalgia rheumatica ([facility])     on chronic Prednisone 5mg  daily  . Arthritis     back  . Hypertension   . Intrinsic urethral sphincter deficiency   . Megaesophagus   . RBBB   . Chronic steroid use   . History of aspiration pneumonitis     DEC 2013  . Mixed stress and urge urinary incontinence   . Pacemaker [employer]--  LAST PACER CHECK 05-09-2013  PER PT CHECK OUT OK    CARIOLOGIST--  DR RUTH GREENFIELD AT [facility]--  PT HAS HAD NORMAL STRESS TEST AND ECHO   . Complete heart block ([place])     S/P PACEMAKER 2000 W/ GENERATOR CHANGE 2009  . Normal cardiac stress test     PER PT AT [facility]  . H/O echocardiogram     NORMAL PER PT DONE AT [facility]  . S/P dilatation of esophageal stricture   . Complete heart block ([place])   . Anemia   . Hyperglycemia 07/17/2013  . OSA on CPAP     cpap, doees not know settings  . Achalasia   . Inguinal hernia     right    Past Surgical History  Procedure Laterality Date  . Video bronchoscopy  06/23/2012    Procedure: VIDEO BRONCHOSCOPY;  Surgeon: Grace Isaac, MD;  Location: [facility];  Service: Thoracic;  Laterality: N/A;  . Video assisted thoracoscopy (vats)/decortication  06/23/2012   Procedure: VIDEO ASSISTED THORACOSCOPY (VATS)/DECORTICATION;  Surgeon: Grace Isaac, MD;  Location: [facility];  Service: Thoracic;  Laterality: Right;  . Esophagogastroduodenoscopy (egd) with esophageal dilation  06/27/2012    Procedure: ESOPHAGOGASTRODUODENOSCOPY (EGD) WITH ESOPHAGEAL DILATION;  Surgeon: Ladene Artist, MD,FACG;  Location: [facility];  Service: Endoscopy;  Laterality: N/A;  . Video assisted thoracoscopy (vats)/empyema      06/23/2012  . Esophagogastroduodenoscopy  07/24/2012    Procedure: ESOPHAGOGASTRODUODENOSCOPY (EGD);  Surgeon: Inda Castle, MD;  Location: [facility];  Service: Endoscopy;  Laterality: N/A;  . Balloon dilation  07/24/2012    Procedure: BALLOON DILATION;  Surgeon: Inda Castle, MD;  Location: [facility];  Service: Endoscopy;  Laterality: N/A;  . Esophagogastroduodenoscopy  08/07/2012    Procedure: ESOPHAGOGASTRODUODENOSCOPY (EGD);  Surgeon: Inda Castle, MD;  Location: [facility];  Service: Endoscopy;  Laterality: N/A;  . Botox injection  08/07/2012    Procedure: BOTOX INJECTION;  Surgeon: Inda Castle, MD;  Location: [facility];  Service: Endoscopy;  Laterality: N/A;  . Toe surgery  2013    left 3rd toe HAMMERTOE REPAIR  . Total hip arthroplasty Right 2001  . Tonsillectomy  AS CHILD  . Cataract extraction w/ intraocular lens  implant, bilateral  2005  . Vericose vein ligation    .  Appendectomy  1953    w/ removal benign kidney tumor   . Botox injection N/A 05/24/2013    Procedure: MACROPLASTIQUE IMPLANT;  Surgeon: Irine Seal, MD;  Location: [facility];  Service: Urology;  Laterality: N/A;  . Joint replacement      right hip, 2001  . Esophagogastroduodenoscopy N/A 02/25/2014    Procedure: ESOPHAGOGASTRODUODENOSCOPY (EGD);  Surgeon: Inda Castle, MD;  Location: [facility];  Service: Endoscopy;  Laterality: N/A;  . Botox injection  02/25/2014    Procedure: BOTOX INJECTION;  Surgeon: Inda Castle, MD;  Location:  [facility];  Service: Endoscopy;;  . Pacemaker generator change  12/13/2007    at [facility]  . Cardiac pacemaker placement  06/1999  DR RUTH GREENFIELD AT [facility]     [employer]  ( LAST PACER CHECK 05-09-2013) for CHB/   END-OF-LIFE GENERATOR CHANGE  2009  . Esophagogastroduodenoscopy (egd) with propofol N/A 10/09/2015    Procedure: ESOPHAGOGASTRODUODENOSCOPY (EGD) WITH PROPOFOL ( WITH BOTOX);  Surgeon: Milus Banister, MD;  Location: [facility];  Service: Endoscopy;  Laterality: N/A;  . Cataract extraction w/ intraocular lens  implant, bilateral    . Dilation and curettage of uterus      Prior to Admission medications   Medication Sig Start Date End Date Taking? Authorizing Provider  acetaminophen (TYLENOL) 325 MG tablet Take 650 mg by mouth every 6 (six) hours as needed for moderate pain (arthritis pain). Reported on 08/18/2015   Yes Historical Provider, MD  ascorbic acid (VITAMIN C) 500 MG tablet Take 500 mg by mouth daily. Reported on 08/26/2015   Yes Historical Provider, MD  b complex vitamins capsule Take 1 capsule by mouth daily.   Yes Historical Provider, MD  Calcium Carbonate (CALCIUM 600 PO) Take 600 mg by mouth daily.   Yes Historical Provider, MD  Cholecalciferol (VITAMIN D-3) 1000 units CAPS Take 1,000 Units by mouth daily.    Yes Historical Provider, MD  ibuprofen (ADVIL,MOTRIN) 200 MG tablet Take 400 mg by mouth daily as needed for headache (headache). Reported on 08/26/2015   Yes Historical Provider, MD  Omega 3 1200 MG CAPS Take 1,200 mg by mouth at bedtime.    Yes Historical Provider, MD  predniSONE (DELTASONE) 1 MG tablet Take 0.5 mg by mouth every morning. To help polymyalgia   Yes Historical Provider, MD  Probiotic Product (PROBIOTIC DAILY PO) Take 1 capsule by mouth daily.    Yes Historical Provider, MD  ranitidine (ZANTAC) 150 MG tablet Take 150 mg by mouth 2 (two) times daily. Take one tablet in morning and one in evening   Yes Historical Provider, MD  Simethicone (PHAZYME PO) Take  1 tablet by mouth daily.    Historical Provider, MD    Current Facility-Administered Medications  Medication Dose Route Frequency Provider Last Rate Last Dose  . 0.9 %  sodium chloride infusion   Intravenous Continuous Anai Lipson V, MD      . 0.9 %  sodium chloride infusion   Intravenous Continuous Jaidalyn Schillo V, MD      . lactated ringers infusion   Intravenous Continuous Harl Bowie V, MD 10 mL/hr at 12/03/15 0903 1,000 mL at 12/03/15 0903    Allergies as of 10/20/2015 - Review Complete 10/09/2015  Allergen Reaction Noted  . Actonel [risedronate sodium] Other (See Comments) 06/19/2012  . Ivp dye [iodinated diagnostic agents] Nausea And Vomiting 06/19/2012    Family History  Problem Relation Age of Onset  . Arthritis Father   .  Heart disease Father   . Cancer Brother     ?spine  . Heart disease Brother   . Lung cancer Brother   . Diabetes Son   . Heart disease Mother     Congestive heart failure  . Heart disease Brother   . Arthritis Brother   . Hypertension Brother   . Colon cancer Neg Hx     Social History   Social History  . Marital Status: Married    Spouse Name: N/A  . Number of Children: 78  . Years of Education: N/A   Occupational History  . Retired    Social History Main Topics  . Smoking status: Passive Smoke Exposure - Never Smoker  . Smokeless tobacco: Never Used     Comment: Exposure through father only.  . Alcohol Use: 2.4 oz/week    4 Glasses of wine per week     Comment: 1 drink x 4 days/week  . Drug Use: No  . Sexual Activity: No     Comment: married   Other Topics Concern  . Not on file   Social History Narrative   [facility] since 05/18/2012   Married   Pacemaker   Living Will   Exercise - exercise classes 5 days a week    Whole Body Donation at [facility]      [facility]:   Originally from [place]. Has lived in [place], [place], [place], & moved to [place] in 1961. Previously worked doing  Web designer work. No pets currently. No bird exposure.     Review of Systems:  All other review of systems negative except as mentioned in the HPI.  Physical Exam: Vital signs in last 24 hours: Temp:  [97.7 F (36.5 C)] 97.7 F (36.5 C) (05/24 0900) Pulse Rate:  [72] 72 (05/24 0900) Resp:  [17] 17 (05/24 0900) BP: (160)/(76) 160/76 mmHg (05/24 0900) SpO2:  [96 %] 96 % (05/24 0900) Weight:  [125 lb (56.7 kg)] 125 lb (56.7 kg) (05/24 0900)   General:   Alert,  Well-developed, well-nourished, pleasant and cooperative in NAD Lungs:  Clear throughout to auscultation.   Heart:  Regular rate and rhythm; no murmurs, clicks, rubs,  or gallops. Abdomen:  Soft, nontender and nondistended. Normal bowel sounds.   Neuro/Psych:  Alert and cooperative. Normal mood and affect. A and O x 3   @K .Denzil Magnuson, MD [facility] [phone] (pager) 12/03/2015 9:42 AM@

## 2015-12-03 NOTE — Op Note (Signed)
[facility] Patient Name: [name] Procedure Date : 12/03/2015 MRN: [id number] Attending MD: Mauri Pole , MD Date of Birth: 1928/08/02 CSN: [id number] Age: 82 Admit Type: Outpatient Procedure:                Upper GI endoscopy Indications:              Dysphagia, Achalasia Providers:                Mauri Pole, MD, Cleda Daub, RN, Alfonso Patten, Technician, Lavona Mound, CRNA Referring MD:              Medicines:                Monitored Anesthesia Care Complications:            No immediate complications. Estimated Blood Loss:     Estimated blood loss was minimal. Procedure:                Pre-Anesthesia Assessment:                           - Prior to the procedure, a History and Physical                            was performed, and patient medications and                            allergies were reviewed. The patient's tolerance of                            previous anesthesia was also reviewed. The risks                            and benefits of the procedure and the sedation                            options and risks were discussed with the patient.                            All questions were answered, and informed consent                            was obtained. Prior Anticoagulants: The patient has                            taken no previous anticoagulant or antiplatelet                            agents. ASA Grade Assessment: III - A patient with                            severe systemic disease. After reviewing the risks  and benefits, the patient was deemed in                            satisfactory condition to undergo the procedure.                           After obtaining informed consent, the endoscope was                            passed under direct vision. Throughout the                            procedure, the patient's blood pressure, pulse, and            oxygen saturations were monitored continuously. The                            [id number]) scope was introduced through the                            mouth, and advanced to the second part of duodenum.                            The upper GI endoscopy was somewhat difficult due                            to presence of food. The patient tolerated the                            procedure well. Scope In: Scope Out: Findings:      The lumen of the esophagus was severely dilated and tortous with       residual food. large epiphrenic diverticula with tight EG junction.      Scattered moderate inflammation with hemorrhage characterized by       erosions and erythema was found in the stomach.      The duodenal bulb and second portion of the duodenum were normal.      Tight EG junction consistent with diagnosis of achalasia. A guide wire       was placed, then the scope was withdrawn. Using the wire as a guide,       dilation with a 30 mm pneumatic balloon was performed under fluoroscopic       guidance. The balloon was inflated to 8 PSI with complete effacement of       the balloon waist. Pressure was held for 60 seconds before balloon       deflation and withdrawal. There was a small amount of blood on the       balloon. Impression:               - Dilated and tortous esophagus                           - Gastritis with hemorrhage.                           - Normal duodenal bulb and second portion of  the                            duodenum.                           - s/p pneumatic dilation with 62mm                           - No specimens collected. Moderate Sedation:      N/A Recommendation:           - Patient has a contact number available for                            emergencies. The signs and symptoms of potential                            delayed complications were discussed with the                            patient. Return to normal activities tomorrow.                             Written discharge instructions were provided to the                            patient.                           - Resume previous diet.                           - Continue present medications. Procedure Code(s):        --- Professional ---                           [phone], Esophagogastroduodenoscopy, flexible,                            transoral; with dilation of esophagus with balloon                            (30 mm diameter or larger) (includes fluoroscopic                            guidance, when performed) Diagnosis Code(s):        --- Professional ---                           K22.8, Other specified diseases of esophagus                           K29.71, Gastritis, unspecified, with bleeding                           K22.2, Esophageal obstruction  R13.10, Dysphagia, unspecified CPT copyright 2016 [employer]. All rights reserved. The codes documented in this report are preliminary and upon coder review may  be revised to meet current compliance requirements. Mauri Pole, MD 12/03/2015 12:39:06 PM This report has been signed electronically. Number of Addenda: 0

## 2015-12-04 ENCOUNTER — Encounter (HOSPITAL_COMMUNITY): Payer: Self-pay | Admitting: Gastroenterology

## 2015-12-11 DIAGNOSIS — S68118D Complete traumatic metacarpophalangeal amputation of other finger, subsequent encounter: Secondary | ICD-10-CM | POA: Diagnosis not present

## 2015-12-11 DIAGNOSIS — Z4789 Encounter for other orthopedic aftercare: Secondary | ICD-10-CM | POA: Diagnosis not present

## 2015-12-16 ENCOUNTER — Encounter: Payer: Self-pay | Admitting: Internal Medicine

## 2016-01-01 DIAGNOSIS — S62631D Displaced fracture of distal phalanx of left index finger, subsequent encounter for fracture with routine healing: Secondary | ICD-10-CM | POA: Diagnosis not present

## 2016-01-01 DIAGNOSIS — S68121D Partial traumatic metacarpophalangeal amputation of left index finger, subsequent encounter: Secondary | ICD-10-CM | POA: Diagnosis not present

## 2016-01-06 ENCOUNTER — Encounter: Payer: Self-pay | Admitting: Internal Medicine

## 2016-01-06 ENCOUNTER — Non-Acute Institutional Stay: Payer: Medicare Other | Admitting: Internal Medicine

## 2016-01-06 VITALS — BP 138/72 | HR 66 | Temp 97.8°F | Ht 60.0 in | Wt 128.0 lb

## 2016-01-06 DIAGNOSIS — M353 Polymyalgia rheumatica: Secondary | ICD-10-CM | POA: Diagnosis not present

## 2016-01-06 DIAGNOSIS — E042 Nontoxic multinodular goiter: Secondary | ICD-10-CM

## 2016-01-06 DIAGNOSIS — K4090 Unilateral inguinal hernia, without obstruction or gangrene, not specified as recurrent: Secondary | ICD-10-CM | POA: Diagnosis not present

## 2016-01-06 DIAGNOSIS — R609 Edema, unspecified: Secondary | ICD-10-CM | POA: Diagnosis not present

## 2016-01-06 DIAGNOSIS — R739 Hyperglycemia, unspecified: Secondary | ICD-10-CM

## 2016-01-06 DIAGNOSIS — K220 Achalasia of cardia: Secondary | ICD-10-CM | POA: Diagnosis not present

## 2016-01-06 DIAGNOSIS — I10 Essential (primary) hypertension: Secondary | ICD-10-CM

## 2016-01-06 DIAGNOSIS — S61209D Unspecified open wound of unspecified finger without damage to nail, subsequent encounter: Secondary | ICD-10-CM | POA: Diagnosis not present

## 2016-01-06 NOTE — Progress Notes (Signed)
Patient ID: [name], female   DOB: [birth year], 81 y.o.   MRN: [id number]    Facility      Place of Service: Clinic (12)     Allergies  Allergen Reactions  . Actonel [Risedronate Sodium] Other (See Comments)    Joint aches; rechallenged --caused joint aches  . Ivp Dye [Iodinated Diagnostic Agents] Nausea And Vomiting    Chief Complaint  Patient presents with  . Medical Management of Chronic Issues    3 week follow-up polymyalgia rheumatica.  12/03/15 EGD with ballon dilation  Dr. Silverio Decamp.    HPI:  EGD was done 12/03/2015 by Dr. Silverio Decamp. It showed a severely dilated and tortuous lumen of the esophagus with physical. Stomach showed scattered moderate inflammation with hemorrhage characterized by erosions and erythema in the stomach. Tight EEG junction was noted with diagnosis of achalasia. The tight junction was dilated with balloon.  Edema, unspecified type - unchanged. +1 bipedal.  Essential hypertension - controlled  Hyperglycemia - no recent lab. Hyperglycemia was felt induced by her prednisone.  Multinodular thyroid - unchanged  Polymyalgia rheumatica (HCC) - denies myalgias or arthralgias. Willing to try to decrease her very low dose of prednisone (currently 0.5 mg daily).   Right inguinal hernia - patient knows her hernia is present, but it does not create any distress or discomfort..  Wound, open, finger, subsequent encounter - incomplete healing of the tip of the left index finger that was injured by a immersion blender.     Medications: Patient's Medications  New Prescriptions   No medications on file  Previous Medications   ACETAMINOPHEN (TYLENOL) 325 MG TABLET    Take 650 mg by mouth every 6 (six) hours as needed for moderate pain (arthritis pain). Reported on 08/18/2015   ASCORBIC ACID (VITAMIN C) 500 MG TABLET    Take 500 mg by mouth daily. Reported on 08/26/2015   B COMPLEX VITAMINS CAPSULE    Take 1 capsule by mouth daily.   CALCIUM CARBONATE  (CALCIUM 600 PO)    Take 600 mg by mouth daily.   CHOLECALCIFEROL (VITAMIN D-3) 1000 UNITS CAPS    Take 1,000 Units by mouth daily.    OMEGA 3 1200 MG CAPS    Take 1,200 mg by mouth at bedtime.    PREDNISONE (DELTASONE) 1 MG TABLET    Take 0.5 mg by mouth every morning. To help polymyalgia   PROBIOTIC PRODUCT (PROBIOTIC DAILY PO)    Take 1 capsule by mouth daily.    RANITIDINE (ZANTAC) 150 MG TABLET    Take 150 mg by mouth 2 (two) times daily. Take one tablet in morning and one in evening  Modified Medications   No medications on file  Discontinued Medications   SIMETHICONE (PHAZYME PO)    Take 1 tablet by mouth daily.     Review of Systems  Constitutional: Negative for fever, activity change, appetite change and fatigue.       Mildly overweight.  Eyes: Negative.   Respiratory:       Aspiration pneumonia December 4650 complicated by subsequent empyema. Appears fully recovered as of 07/17/13.  Cardiovascular: Negative for chest pain, palpitations and leg swelling.  Gastrointestinal:       Known achalasia and megaesophagus. Some dysphagia.   Endocrine: Negative.   Genitourinary:       Chronic urinary leakage. Wears protective pads for at least 10 years. She has seen urologist in the past. Sling surgery was proposed, but she did not want to go  through this.  Musculoskeletal: Positive for myalgias. Negative for back pain and gait problem.       History of polymyalgia rheumatica. No pains at present. Continues on prednisone 0.5 mg daily. We tried to take her off of this medication tapering it a year ago, but she could not tolerate the resumption of myalgias.  Skin: Negative.   Allergic/Immunologic: Negative.   Neurological: Negative.   Hematological:       History of anemia.  Psychiatric/Behavioral: Negative.     Filed Vitals:   01/06/16 0948  BP: 138/72  Pulse: 66  Temp: 97.8 F (36.6 C)  Height: 5' (1.524 m)  Weight: 128 lb (58.06 kg)  SpO2: 97%   Wt Readings from Last 3  Encounters:  01/06/16 128 lb (58.06 kg)  12/03/15 125 lb (56.7 kg)  11/25/15 125 lb (56.7 kg)    Body mass index is 25 kg/(m^2).  Physical Exam  Constitutional: She is oriented to person, place, and time. She appears well-developed and well-nourished. No distress.  HENT:  Head: Normocephalic and atraumatic.  Right Ear: External ear normal.  Left Ear: External ear normal.  Nose: Nose normal.  Mouth/Throat: Oropharynx is clear and moist.  Eyes:  Corrective lenses worn.  Neck: Thyromegaly present.  Multinodular goiter with nodules palpable in all 4 lobes. Cyst in the right upper lobe of the thyroid is particularly large.  Cardiovascular: Normal rate, regular rhythm and intact distal pulses.  Exam reveals friction rub. Exam reveals no gallop.   No murmur heard. Pulmonary/Chest: Effort normal and breath sounds normal. No respiratory distress. She has no wheezes. She has no rales. She exhibits no tenderness.  Abdominal: Soft. Bowel sounds are normal. She exhibits no distension and no mass. There is no tenderness.  Musculoskeletal: Normal range of motion. She exhibits no edema or tenderness.  Left leg is shorter than the right. Left 2nd and 4th hammer toes. Left 3rd toe had corrective surgery for hammer toe. History of entry of the left index finger by an immersion lender. Complete healing, but the tip of the finger is numb and exquisitely tender at times.  Neurological: She is alert and oriented to person, place, and time. She has normal reflexes. No cranial nerve deficit. Coordination normal.  Skin: No rash noted. No erythema.  Psychiatric: She has a normal mood and affect. Her behavior is normal. Judgment and thought content normal.     Labs reviewed: Lab Summary Latest Ref Rng 11/20/2015  Hemoglobin 13.0-17.0 g/dL (None)  Hematocrit 39.0-52.0 % (None)  White count - (None)  Platelet count - (None)  Sodium 137 - 147 mmol/L 138  Potassium 3.4 - 5.3 mmol/L 4.1  Calcium - (None)    Phosphorus - (None)  Creatinine 0.5 - 1.1 mg/dL 0.6  AST 13 - 35 U/L 17  Alk Phos 25 - 125 U/L 70  Bilirubin - (None)  Glucose - 99  Cholesterol - (None)  HDL cholesterol - (None)  Triglycerides - (None)  LDL Direct - (None)  LDL Calc - (None)  Total protein - (None)  Albumin - (None)   Lab Results  Component Value Date   TSH 0.84 11/20/2015   Lab Results  Component Value Date   BUN 23* 11/20/2015   BUN 16 03/31/2015   BUN 15 09/23/2014   Lab Results  Component Value Date   CREATININE 0.6 11/20/2015   CREATININE 0.6 03/31/2015   CREATININE 0.7 09/23/2014   Lab Results  Component Value Date   HGBA1C 5.5 09/23/2014  HGBA1C 5.5 11/05/2013       Assessment/Plan  1. Achalasia of esophagus Much improved since dilation. Swallowing better. Weight is stabilized.  2. Edema, unspecified type Chronic and unchanged  3. Essential hypertension Controlled  4. Hyperglycemia -CMP, A1c, future  5. Multinodular thyroid -TSH, future  6. Polymyalgia rheumatica (HCC) -Try skipping one day per week of the prednisone  7. Right inguinal hernia -Observe  8. Wound, open, finger, subsequent encounter Residual numbness and discomfort at the tip of the left index finger.

## 2016-01-08 NOTE — Addendum Note (Signed)
Addended by: Estill Dooms on: 01/08/2016 12:36 PM   Modules accepted: Level of Service

## 2016-01-20 ENCOUNTER — Telehealth: Payer: Self-pay | Admitting: Cardiology

## 2016-01-20 ENCOUNTER — Telehealth: Payer: Self-pay | Admitting: *Deleted

## 2016-01-20 ENCOUNTER — Ambulatory Visit (INDEPENDENT_AMBULATORY_CARE_PROVIDER_SITE_OTHER): Payer: Medicare Other | Admitting: *Deleted

## 2016-01-20 DIAGNOSIS — I442 Atrioventricular block, complete: Secondary | ICD-10-CM | POA: Diagnosis not present

## 2016-01-20 NOTE — Telephone Encounter (Signed)
LMOVM reminding pt to send remote transmission.   

## 2016-01-20 NOTE — Telephone Encounter (Signed)
[name] calling for instructions to send transmission for PPM. She cannot find wireless adapter and was trying to use landline telephone. I will order her a new adapter and she has been instructed to send transmission once she receives the new adapter.

## 2016-01-23 ENCOUNTER — Encounter: Payer: Self-pay | Admitting: Cardiology

## 2016-01-27 LAB — CUP PACEART REMOTE DEVICE CHECK
Battery Impedance: 1993 Ohm
Brady Statistic AP VP Percent: 1 %
Brady Statistic AP VS Percent: 21 %
Brady Statistic AS VP Percent: 0 %
Brady Statistic AS VS Percent: 78 %
Date Time Interrogation Session: 20170714174523
Implantable Lead Implant Date: 20001227
Implantable Lead Implant Date: 20001227
Implantable Lead Location: 753860
Lead Channel Impedance Value: 576 Ohm
Lead Channel Pacing Threshold Amplitude: 0.5 V
Lead Channel Sensing Intrinsic Amplitude: 1 mV
Lead Channel Sensing Intrinsic Amplitude: 8 mV
Lead Channel Setting Pacing Pulse Width: 0.4 ms
MDC IDC LEAD LOCATION: 753859
MDC IDC MSMT BATTERY REMAINING LONGEVITY: 35 mo
MDC IDC MSMT BATTERY VOLTAGE: 2.75 V
MDC IDC MSMT LEADCHNL RA IMPEDANCE VALUE: 442 Ohm
MDC IDC MSMT LEADCHNL RA PACING THRESHOLD PULSEWIDTH: 0.4 ms
MDC IDC MSMT LEADCHNL RV PACING THRESHOLD AMPLITUDE: 0.625 V
MDC IDC MSMT LEADCHNL RV PACING THRESHOLD PULSEWIDTH: 0.4 ms
MDC IDC SET LEADCHNL RA PACING AMPLITUDE: 2 V
MDC IDC SET LEADCHNL RV PACING AMPLITUDE: 2.5 V
MDC IDC SET LEADCHNL RV SENSING SENSITIVITY: 4 mV

## 2016-01-27 NOTE — Progress Notes (Signed)
Remote pacemaker transmission.   

## 2016-01-28 ENCOUNTER — Encounter: Payer: Self-pay | Admitting: Cardiology

## 2016-01-29 DIAGNOSIS — Z4789 Encounter for other orthopedic aftercare: Secondary | ICD-10-CM | POA: Diagnosis not present

## 2016-01-29 DIAGNOSIS — S62631D Displaced fracture of distal phalanx of left index finger, subsequent encounter for fracture with routine healing: Secondary | ICD-10-CM | POA: Diagnosis not present

## 2016-01-29 DIAGNOSIS — S68121D Partial traumatic metacarpophalangeal amputation of left index finger, subsequent encounter: Secondary | ICD-10-CM | POA: Diagnosis not present

## 2016-02-06 ENCOUNTER — Ambulatory Visit (INDEPENDENT_AMBULATORY_CARE_PROVIDER_SITE_OTHER): Payer: Medicare Other | Admitting: Gastroenterology

## 2016-02-06 ENCOUNTER — Encounter: Payer: Self-pay | Admitting: Gastroenterology

## 2016-02-06 VITALS — BP 86/40 | HR 87 | Ht 61.0 in | Wt 133.0 lb

## 2016-02-06 DIAGNOSIS — Z8719 Personal history of other diseases of the digestive system: Secondary | ICD-10-CM | POA: Diagnosis not present

## 2016-02-06 DIAGNOSIS — K220 Achalasia of cardia: Secondary | ICD-10-CM

## 2016-02-06 DIAGNOSIS — Z9889 Other specified postprocedural states: Secondary | ICD-10-CM | POA: Diagnosis not present

## 2016-02-06 NOTE — Patient Instructions (Signed)
Follow up as needed

## 2016-02-09 NOTE — Progress Notes (Signed)
[name]    YS:[id number]    [birth year]  Primary Care Physician:Arthur Nyoka Cowden, MD  Referring Physician: Estill Dooms, MD [address] [place],  [address]  Chief complaint:  Achalasia  HPI:  82 yr F with chronic dysphagia, findings of dilated and tortuous esophagus suggestive of achalasia here for follow-up visit after pneumatic 30 mm balloon dilation. Patient reports significant improvement in her symptoms status post dilation, she no longer has any heartburn and is able to swallow much better. She has advanced her diet from pured to regular food. Denies any episodes of food impaction or regurgitation. She has gained some weight since dilation.   Outpatient Encounter Prescriptions as of 02/06/2016  Medication Sig  . acetaminophen (TYLENOL) 325 MG tablet Take 650 mg by mouth every 6 (six) hours as needed for moderate pain (arthritis pain). Reported on 08/18/2015  . ascorbic acid (VITAMIN C) 500 MG tablet Take 500 mg by mouth daily. Reported on 08/26/2015  . b complex vitamins capsule Take 1 capsule by mouth daily.  . Calcium Carbonate (CALCIUM 600 PO) Take 600 mg by mouth daily.  . Cholecalciferol (VITAMIN D-3) 1000 units CAPS Take 1,000 Units by mouth daily.   . Omega 3 1200 MG CAPS Take 1,200 mg by mouth at bedtime.   . predniSONE (DELTASONE) 1 MG tablet Take 0.5 mg by mouth every morning. To help polymyalgia  . Probiotic Product (PROBIOTIC DAILY PO) Take 1 capsule by mouth daily.   . ranitidine (ZANTAC) 150 MG tablet Take 150 mg by mouth 2 (two) times daily. Take one tablet in morning and one in evening   No facility-administered encounter medications on file as of 02/06/2016.     Allergies as of 02/06/2016 - Review Complete 02/06/2016  Allergen Reaction Noted  . Actonel [risedronate sodium] Other (See Comments) 06/19/2012  . Ivp dye [iodinated diagnostic agents] Nausea And Vomiting 06/19/2012    Past Medical History:  Diagnosis Date  . Achalasia     . Anemia   . Arthritis    back  . Chronic steroid use   . Complete heart block ([place])    S/P PACEMAKER 2000 W/ GENERATOR CHANGE 2009  . Complete heart block ([place])   . Empyema, right ([place]) PULMOLOGIST-- DR CLANCE   VATS 06/23/2012 cultures negative to date CXR 07/19/12 persistent airfluid levels/  CXR 11-01-2012 IMPROVE RIGHT PLEURAL EFFUSION  . GERD (gastroesophageal reflux disease)   . H/O echocardiogram    NORMAL PER PT DONE AT [facility]  . History of aspiration pneumonitis    DEC 2013  . Hyperglycemia 07/17/2013  . Hypertension   . Inguinal hernia    right  . Intrinsic urethral sphincter deficiency   . Megaesophagus   . Mixed stress and urge urinary incontinence   . Multinodular thyroid 06/26/2012   Multi nodular goiter. Large nodules in both lobes of the gland.  These nodules fit national criteria for fine needle aspiration  biopsy if not previously assessed.    . Normal cardiac stress test    PER PT AT [facility]  . OSA on CPAP    cpap, doees not know settings  . Pacemaker [employer]--  LAST PACER CHECK 05-09-2013  PER PT CHECK OUT OK   CARIOLOGIST--  DR RUTH GREENFIELD AT [facility]--  PT HAS HAD NORMAL STRESS TEST AND ECHO   . Polymyalgia rheumatica (HCC)    on chronic Prednisone 5mg  daily  . RBBB   . S/P  dilatation of esophageal stricture     Past Surgical History:  Procedure Laterality Date  . APPENDECTOMY  1953   w/ removal benign kidney tumor   . BALLOON DILATION  07/24/2012   Procedure: BALLOON DILATION;  Surgeon: Inda Castle, MD;  Location: [facility];  Service: Endoscopy;  Laterality: N/A;  . BALLOON DILATION N/A 12/03/2015   Procedure: BALLOON DILATION;  Surgeon: Mauri Pole, MD;  Location: [facility];  Service: Endoscopy;  Laterality: N/A;  pnuematic balloon  . BOTOX INJECTION  08/07/2012   Procedure: BOTOX INJECTION;  Surgeon: Inda Castle, MD;  Location: [facility];  Service: Endoscopy;  Laterality: N/A;  . BOTOX INJECTION N/A 05/24/2013   Procedure:  MACROPLASTIQUE IMPLANT;  Surgeon: Irine Seal, MD;  Location: [facility];  Service: Urology;  Laterality: N/A;  . BOTOX INJECTION  02/25/2014   Procedure: BOTOX INJECTION;  Surgeon: Inda Castle, MD;  Location: [facility];  Service: Endoscopy;;  . CARDIAC PACEMAKER PLACEMENT  06/1999  DR RUTH GREENFIELD AT [facility]  ( LAST PACER CHECK 05-09-2013) for CHB/   END-OF-LIFE GENERATOR CHANGE  2009  . CATARACT EXTRACTION W/ INTRAOCULAR LENS  IMPLANT, BILATERAL  2005  . CATARACT EXTRACTION W/ INTRAOCULAR LENS  IMPLANT, BILATERAL    . DILATION AND CURETTAGE OF UTERUS    . ESOPHAGOGASTRODUODENOSCOPY  07/24/2012   Procedure: ESOPHAGOGASTRODUODENOSCOPY (EGD);  Surgeon: Inda Castle, MD;  Location: [facility];  Service: Endoscopy;  Laterality: N/A;  . ESOPHAGOGASTRODUODENOSCOPY  08/07/2012   Procedure: ESOPHAGOGASTRODUODENOSCOPY (EGD);  Surgeon: Inda Castle, MD;  Location: [facility];  Service: Endoscopy;  Laterality: N/A;  . ESOPHAGOGASTRODUODENOSCOPY N/A 02/25/2014   Procedure: ESOPHAGOGASTRODUODENOSCOPY (EGD);  Surgeon: Inda Castle, MD;  Location: [facility];  Service: Endoscopy;  Laterality: N/A;  . ESOPHAGOGASTRODUODENOSCOPY (EGD) WITH ESOPHAGEAL DILATION  06/27/2012   Procedure: ESOPHAGOGASTRODUODENOSCOPY (EGD) WITH ESOPHAGEAL DILATION;  Surgeon: Ladene Artist, MD,FACG;  Location: [facility];  Service: Endoscopy;  Laterality: N/A;  . ESOPHAGOGASTRODUODENOSCOPY (EGD) WITH PROPOFOL N/A 10/09/2015   Procedure: ESOPHAGOGASTRODUODENOSCOPY (EGD) WITH PROPOFOL ( WITH BOTOX);  Surgeon: Milus Banister, MD;  Location: [facility];  Service: Endoscopy;  Laterality: N/A;  . ESOPHAGOGASTRODUODENOSCOPY (EGD) WITH PROPOFOL N/A 12/03/2015   Procedure: ESOPHAGOGASTRODUODENOSCOPY (EGD) WITH PROPOFOL;  Surgeon: Mauri Pole, MD;  Location: [facility];  Service: Endoscopy;  Laterality: N/A;  . JOINT REPLACEMENT     right hip, 2001  . PACEMAKER GENERATOR CHANGE   12/13/2007   at [facility]  . TOE SURGERY  2013   left 3rd toe HAMMERTOE REPAIR  . TONSILLECTOMY  AS CHILD  . TOTAL HIP ARTHROPLASTY Right 2001  . VERICOSE VEIN LIGATION    . VIDEO ASSISTED THORACOSCOPY (VATS)/DECORTICATION  06/23/2012   Procedure: VIDEO ASSISTED THORACOSCOPY (VATS)/DECORTICATION;  Surgeon: Grace Isaac, MD;  Location: [facility];  Service: Thoracic;  Laterality: Right;  Marland Kitchen VIDEO ASSISTED THORACOSCOPY (VATS)/EMPYEMA     06/23/2012  . VIDEO BRONCHOSCOPY  06/23/2012   Procedure: VIDEO BRONCHOSCOPY;  Surgeon: Grace Isaac, MD;  Location: [facility] OR;  Service: Thoracic;  Laterality: N/A;    Family History  Problem Relation Age of Onset  . Arthritis Father   . Heart disease Father   . Cancer Brother     ?spine  . Heart disease Brother   . Lung cancer Brother   . Diabetes Son   . Heart disease Mother     Congestive heart failure  . Heart disease Brother   . Arthritis  Brother   . Hypertension Brother   . Colon cancer Neg Hx     Social History   Social History  . Marital status: Married    Spouse name: N/A  . Number of children: 89  . Years of education: N/A   Occupational History  . Retired    Social History Main Topics  . Smoking status: Passive Smoke Exposure - Never Smoker  . Smokeless tobacco: Never Used     Comment: Exposure through father only.  . Alcohol use 2.4 oz/week    4 Glasses of wine per week     Comment: 1 drink x 4 days/week  . Drug use: No  . Sexual activity: No     Comment: married   Other Topics Concern  . Not on file   Social History Narrative   [facility] at [facility] since 05/18/2012   Married   Pacemaker   Living Will   Exercise - exercise classes 5 days a week    Whole Body Donation at [facility]      [facility]:   Originally from [place]. Has lived in [place], [place], [place], & moved to [place] in 1961. 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 No bird exposure.       Review of  systems: Review of Systems  Constitutional: Negative for fever and chills.  HENT: Negative.   Eyes: Negative for blurred vision.  Respiratory: Negative for cough, shortness of breath and wheezing.   Cardiovascular: Negative for chest pain and palpitations.  Gastrointestinal: as per HPI Genitourinary: Negative for dysuria, urgency, frequency and hematuria.  Musculoskeletal: Positive for myalgias, back pain and joint pain.  Skin: Negative for itching and rash.  Neurological: Negative for dizziness, tremors, focal weakness, seizures and loss of consciousness.  Endo/Heme/Allergies: Negative for environmental allergies.  Psychiatric/Behavioral: Negative for depression, suicidal ideas and hallucinations.  All other systems reviewed and are negative.   Physical Exam: Vitals:   02/06/16 1350  BP: (!) 86/40  Pulse: 87   Gen:      No acute distress HEENT:  EOMI, sclera anicteric Neck:     No masses; no thyromegaly Lungs:    Clear to auscultation bilaterally; normal respiratory effort CV:         Regular rate and rhythm; no murmurs Abd:      + bowel sounds; soft, non-tender; no palpable masses, no distension Ext:    No edema; adequate peripheral perfusion Skin:      Warm and dry; no rash Neuro: alert and oriented x 3 Psych: normal mood and affect  Data Reviewed: Reviewed chart in epic   Assessment and Plan/Recommendations:  82 year old female with history of chronic dysphagia and achalasia type II is here for follow-up status post 48mm pneumatic balloon dilation with significant improvement of symptoms Advised patient to continue to follow antireflux measures and to be upright for at least 2-3 hours after every meal Continue to avoid raw vegetables, salads or chunky meat Return as needed 25 minutes was spent face-to-face with the patient. Greater than 50% of the time used for counseling as well as treatment plan and follow-up.    Damaris Hippo , MD [phone] Mon-Fri  8a-5p [phone] after 5p, weekends, holidays  CC: Estill Dooms, MD

## 2016-04-22 DIAGNOSIS — Z23 Encounter for immunization: Secondary | ICD-10-CM | POA: Diagnosis not present

## 2016-04-27 ENCOUNTER — Ambulatory Visit (INDEPENDENT_AMBULATORY_CARE_PROVIDER_SITE_OTHER): Payer: Medicare Other | Admitting: *Deleted

## 2016-04-27 ENCOUNTER — Telehealth: Payer: Self-pay | Admitting: Cardiology

## 2016-04-27 DIAGNOSIS — I495 Sick sinus syndrome: Secondary | ICD-10-CM

## 2016-04-27 NOTE — Telephone Encounter (Signed)
LMOVM reminding pt to send remote transmission.   

## 2016-04-28 ENCOUNTER — Encounter: Payer: Self-pay | Admitting: Cardiology

## 2016-04-28 NOTE — Progress Notes (Signed)
Remote pacemaker transmission.   

## 2016-05-05 ENCOUNTER — Other Ambulatory Visit: Payer: Self-pay

## 2016-05-05 DIAGNOSIS — D508 Other iron deficiency anemias: Secondary | ICD-10-CM

## 2016-05-05 DIAGNOSIS — R739 Hyperglycemia, unspecified: Secondary | ICD-10-CM

## 2016-05-05 DIAGNOSIS — I10 Essential (primary) hypertension: Secondary | ICD-10-CM

## 2016-05-05 DIAGNOSIS — I442 Atrioventricular block, complete: Secondary | ICD-10-CM

## 2016-05-05 DIAGNOSIS — E042 Nontoxic multinodular goiter: Secondary | ICD-10-CM

## 2016-05-05 NOTE — Addendum Note (Signed)
Addended by: Logan Bores on: 05/05/2016 10:17 AM   Modules accepted: Orders

## 2016-05-14 LAB — CUP PACEART REMOTE DEVICE CHECK
Battery Impedance: 2055 Ohm
Battery Remaining Longevity: 33 mo
Brady Statistic AP VP Percent: 2 %
Brady Statistic AS VS Percent: 79 %
Date Time Interrogation Session: 20171017164857
Implantable Lead Implant Date: 20001227
Implantable Lead Location: 753859
Implantable Lead Location: 753860
Implantable Pulse Generator Implant Date: 20090603
Lead Channel Pacing Threshold Amplitude: 0.625 V
Lead Channel Pacing Threshold Pulse Width: 0.4 ms
Lead Channel Sensing Intrinsic Amplitude: 8 mV
Lead Channel Setting Pacing Amplitude: 2 V
Lead Channel Setting Pacing Amplitude: 2.5 V
Lead Channel Setting Pacing Pulse Width: 0.4 ms
MDC IDC LEAD IMPLANT DT: 20001227
MDC IDC MSMT BATTERY VOLTAGE: 2.75 V
MDC IDC MSMT LEADCHNL RA IMPEDANCE VALUE: 454 Ohm
MDC IDC MSMT LEADCHNL RA PACING THRESHOLD AMPLITUDE: 0.5 V
MDC IDC MSMT LEADCHNL RA PACING THRESHOLD PULSEWIDTH: 0.4 ms
MDC IDC MSMT LEADCHNL RA SENSING INTR AMPL: 1 mV
MDC IDC MSMT LEADCHNL RV IMPEDANCE VALUE: 590 Ohm
MDC IDC SET LEADCHNL RV SENSING SENSITIVITY: 4 mV
MDC IDC STAT BRADY AP VS PERCENT: 19 %
MDC IDC STAT BRADY AS VP PERCENT: 0 %

## 2016-06-01 ENCOUNTER — Encounter: Payer: Self-pay | Admitting: Internal Medicine

## 2016-06-24 ENCOUNTER — Other Ambulatory Visit: Payer: Self-pay

## 2016-06-24 DIAGNOSIS — E042 Nontoxic multinodular goiter: Secondary | ICD-10-CM

## 2016-06-24 DIAGNOSIS — R739 Hyperglycemia, unspecified: Secondary | ICD-10-CM

## 2016-06-24 DIAGNOSIS — I442 Atrioventricular block, complete: Secondary | ICD-10-CM

## 2016-06-24 DIAGNOSIS — D508 Other iron deficiency anemias: Secondary | ICD-10-CM

## 2016-06-24 DIAGNOSIS — I10 Essential (primary) hypertension: Secondary | ICD-10-CM

## 2016-07-01 DIAGNOSIS — R739 Hyperglycemia, unspecified: Secondary | ICD-10-CM | POA: Diagnosis not present

## 2016-07-01 DIAGNOSIS — E042 Nontoxic multinodular goiter: Secondary | ICD-10-CM | POA: Diagnosis not present

## 2016-07-01 DIAGNOSIS — D508 Other iron deficiency anemias: Secondary | ICD-10-CM | POA: Diagnosis not present

## 2016-07-01 DIAGNOSIS — I10 Essential (primary) hypertension: Secondary | ICD-10-CM | POA: Diagnosis not present

## 2016-07-01 DIAGNOSIS — I442 Atrioventricular block, complete: Secondary | ICD-10-CM | POA: Diagnosis not present

## 2016-07-01 LAB — COMPLETE METABOLIC PANEL WITH GFR
ALBUMIN: 3.8 g/dL (ref 3.6–5.1)
ALT: 11 U/L (ref 6–29)
AST: 16 U/L (ref 10–35)
Alkaline Phosphatase: 75 U/L (ref 33–130)
BUN: 25 mg/dL (ref 7–25)
CO2: 29 mmol/L (ref 20–31)
Calcium: 9.4 mg/dL (ref 8.6–10.4)
Chloride: 104 mmol/L (ref 98–110)
Creat: 0.7 mg/dL (ref 0.60–0.88)
GFR, EST NON AFRICAN AMERICAN: 79 mL/min (ref >=60)
GFR, Est African American: >89 mL/min (ref >=60)
GLUCOSE: 105 mg/dL — AB (ref 65–99)
Potassium: 4.4 mmol/L (ref 3.5–5.3)
SODIUM: 141 mmol/L (ref 135–146)
TOTAL PROTEIN: 6.8 g/dL (ref 6.1–8.1)
Total Bilirubin: 0.5 mg/dL (ref 0.2–1.2)

## 2016-07-01 LAB — CBC WITH DIFFERENTIAL/PLATELET
BASOS PCT: 0 %
Basophils Absolute: 0 cells/uL (ref 0–200)
EOS PCT: 3 %
Eosinophils Absolute: 153 cells/uL (ref 15–500)
HCT: 39.8 % (ref 35.0–45.0)
Hemoglobin: 13 g/dL (ref 11.7–15.5)
LYMPHS ABS: 1938 {cells}/uL (ref 850–3900)
LYMPHS PCT: 38 %
MCH: 32.4 pg (ref 27.0–33.0)
MCHC: 32.7 g/dL (ref 32.0–36.0)
MCV: 99.3 fL (ref 80.0–100.0)
MPV: 10 fL (ref 7.5–12.5)
Monocytes Absolute: 459 cells/uL (ref 200–950)
Monocytes Relative: 9 %
NEUTROS PCT: 50 %
Neutro Abs: 2550 cells/uL (ref 1500–7800)
Platelets: 117 10*3/uL — ABNORMAL LOW (ref 140–400)
RBC: 4.01 MIL/uL (ref 3.80–5.10)
RDW: 14 % (ref 11.0–15.0)
WBC: 5.1 10*3/uL (ref 3.8–10.8)

## 2016-07-01 LAB — LIPID PANEL
CHOL/HDL RATIO: 2.2 ratio (ref <5.0)
Cholesterol: 158 mg/dL (ref <200)
HDL: 71 mg/dL (ref >50)
LDL CALC: 76 mg/dL (ref <100)
Triglycerides: 57 mg/dL (ref <150)
VLDL: 11 mg/dL (ref <30)

## 2016-07-01 LAB — TSH: TSH: 0.67 mIU/L

## 2016-07-01 LAB — HEMOGLOBIN A1C
HEMOGLOBIN A1C: 5.1 % (ref <5.7)
MEAN PLASMA GLUCOSE: 100 mg/dL

## 2016-07-06 ENCOUNTER — Encounter: Payer: Self-pay | Admitting: Internal Medicine

## 2016-07-06 ENCOUNTER — Non-Acute Institutional Stay: Payer: Medicare Other | Admitting: Internal Medicine

## 2016-07-06 VITALS — BP 142/72 | HR 73 | Temp 97.6°F | Ht 61.0 in | Wt 143.0 lb

## 2016-07-06 DIAGNOSIS — R739 Hyperglycemia, unspecified: Secondary | ICD-10-CM

## 2016-07-06 DIAGNOSIS — I10 Essential (primary) hypertension: Secondary | ICD-10-CM | POA: Diagnosis not present

## 2016-07-06 DIAGNOSIS — L570 Actinic keratosis: Secondary | ICD-10-CM | POA: Diagnosis not present

## 2016-07-06 DIAGNOSIS — M353 Polymyalgia rheumatica: Secondary | ICD-10-CM

## 2016-07-06 DIAGNOSIS — K220 Achalasia of cardia: Secondary | ICD-10-CM | POA: Diagnosis not present

## 2016-07-06 DIAGNOSIS — Z7952 Long term (current) use of systemic steroids: Secondary | ICD-10-CM | POA: Diagnosis not present

## 2016-07-06 DIAGNOSIS — E042 Nontoxic multinodular goiter: Secondary | ICD-10-CM

## 2016-07-06 NOTE — Progress Notes (Signed)
Facility  [facility]    Place of Service: Clinic (12)     Allergies  Allergen Reactions  . Actonel [Risedronate Sodium] Other (See Comments)    Joint aches; rechallenged --caused joint aches  . Ivp Dye [Iodinated Diagnostic Agents] Nausea And Vomiting    Chief Complaint  Patient presents with  . Annual Exam    physical  . Medical Management of Chronic Issues    medication management blood pressure, hyperglycemia, thyroid, review labs.  Marland Kitchen MMSE    30/30 passed clock drawing    HPI:  Essential hypertension - controlled  Hyperglycemia - controlled  Multinodular thyroid - normal TSH on 07/01/16. She thinks her thyroid is smaller in the last few months.  Achalasia of esophagus - chronic dysphagia, findings of dilated and tortuous esophagus suggestive of achalasia. HX of 30 mm balloon dilation. Patient reports significant improvement in her symptoms status post dilation, she no longer has any heartburn and is able to swallow much better. She has advanced her diet from pured to regular food. Denies any episodes of food impaction or regurgitation. She has gained some weight since dilation.  Long term current use of systemic steroids - has been cutting back on prednisone and has been successful in getting it down to about 2 days per week.  Polymyalgia rheumatica (HCC) - stable and without increase in symptoms when cut back the prednisone.  Actinic keratoses - facial for the most part. Would like to get something done about them.     Medications: Patient's Medications  New Prescriptions   No medications on file  Previous Medications   ACETAMINOPHEN (TYLENOL) 325 MG TABLET    Take 650 mg by mouth every 6 (six) hours as needed for moderate pain (arthritis pain). Reported on 08/18/2015   ASCORBIC ACID (VITAMIN C) 500 MG TABLET    Take 500 mg by mouth daily. Reported on 08/26/2015   B COMPLEX VITAMINS CAPSULE    Take 1 capsule by mouth daily.   CALCIUM CARBONATE (CALCIUM 600 PO)     Take 600 mg by mouth daily.   CHOLECALCIFEROL (VITAMIN D-3) 1000 UNITS CAPS    Take 1,000 Units by mouth daily.    OMEGA 3 1200 MG CAPS    Take 1,200 mg by mouth at bedtime.    PREDNISONE (DELTASONE) 1 MG TABLET    Take 0.5 mg by mouth. Takes 0.5 tablet as needed about every 4 days. To help polymyalgia   PROBIOTIC PRODUCT (PROBIOTIC DAILY PO)    Take 1 capsule by mouth daily.    RANITIDINE (ZANTAC) 150 MG TABLET    Take 150 mg by mouth 2 (two) times daily. Take one tablet in morning and one in evening  Modified Medications   No medications on file  Discontinued Medications   No medications on file     Review of Systems  Constitutional: Negative for activity change, appetite change, fatigue and fever.       Mildly overweight.  Eyes: Negative.   Respiratory:       Aspiration pneumonia December 8341 complicated by subsequent empyema. Appears fully recovered as of 07/17/13.  Cardiovascular: Negative for chest pain, palpitations and leg swelling.  Gastrointestinal:       Known achalasia and megaesophagus. Some dysphagia.   Endocrine: Negative.   Genitourinary:       Chronic urinary leakage. Wears protective pads for at least 10 years. She has seen urologist in the past. Sling surgery was proposed, but she did not want  to go through this.  Musculoskeletal: Positive for myalgias. Negative for back pain and gait problem.       History of polymyalgia rheumatica. No pains at present. Continues on prednisone 0.5 mg about twice weekly. We tried to take her off of this medication tapering it a year ago, but she could not tolerate the resumption of myalgias.  Skin: Negative.   Allergic/Immunologic: Negative.   Neurological: Negative.   Hematological:       History of anemia.  Psychiatric/Behavioral: Negative.     Vitals:   07/06/16 1021  BP: (!) 142/72  Pulse: 73  Temp: 97.6 F (36.4 C)  TempSrc: Oral  SpO2: 99%  Weight: 143 lb (64.9 kg)  Height: _0  (1.549 m)   Wt Readings from Last  3 Encounters:  07/06/16 143 lb (64.9 kg)  02/06/16 133 lb (60.3 kg)  01/06/16 128 lb (58.1 kg)    Body mass index is 27.02 kg/m.  Physical Exam  Constitutional: She is oriented to person, place, and time. She appears well-developed and well-nourished. No distress.  HENT:  Head: Normocephalic and atraumatic.  Right Ear: External ear normal.  Left Ear: External ear normal.  Nose: Nose normal.  Mouth/Throat: Oropharynx is clear and moist.  Eyes:  Corrective lenses worn.  Neck: Thyromegaly present.  Multinodular goiter with nodules palpable in all 4 lobes. Cyst in the right upper lobe of the thyroid is particularly large.  Cardiovascular: Normal rate, regular rhythm and intact distal pulses.  Exam reveals friction rub. Exam reveals no gallop.   No murmur heard. Pulmonary/Chest: Effort normal and breath sounds normal. No respiratory distress. She has no wheezes. She has no rales. She exhibits no tenderness.  Abdominal: Soft. Bowel sounds are normal. She exhibits no distension and no mass. There is no tenderness.  Musculoskeletal: Normal range of motion. She exhibits no edema or tenderness.  Left leg is shorter than the right. Left 2nd and 4th hammer toes. Left 3rd toe had corrective surgery for hammer toe. History of entry of the left index finger by an immersion lender. Complete healing, but the tip of the finger is numb and exquisitely tender at times.  Neurological: She is alert and oriented to person, place, and time. She has normal reflexes. No cranial nerve deficit. Coordination normal.  07/06/16 MMSE 30/30. Passed clock drawing/  Skin: No rash noted. No erythema.  Psychiatric: She has a normal mood and affect. Her behavior is normal. Judgment and thought content normal.     Labs reviewed: Lab Summary Latest Ref Rng & Units 07/01/2016 11/20/2015  Hemoglobin 11.7 - 15.5 g/dL 13.0 (None)  Hematocrit 35.0 - 45.0 % 39.8 (None)  White count 3.8 - 10.8 K/uL 5.1 (None)  Platelet  count 140 - 400 K/uL 117(L) (None)  Sodium 135 - 146 mmol/L 141 138  Potassium 3.5 - 5.3 mmol/L 4.4 4.1  Calcium 8.6 - 10.4 mg/dL 9.4 (None)  Phosphorus - (None) (None)  Creatinine 0.60 - 0.88 mg/dL 0.70 0.6  AST 10 - 35 U/L 16 17  Alk Phos 33 - 130 U/L 75 70  Bilirubin 0.2 - 1.2 mg/dL 0.5 (None)  Glucose 65 - 99 mg/dL 105(H) 99  Cholesterol <200 mg/dL 158 (None)  HDL cholesterol >50 mg/dL 71 (None)  Triglycerides <150 mg/dL 57 (None)  LDL Direct - (None) (None)  LDL Calc <100 mg/dL 76 (None)  Total protein 6.1 - 8.1 g/dL 6.8 (None)  Albumin 3.6 - 5.1 g/dL 3.8 (None)  Some recent data might be  hidden   Lab Results  Component Value Date   TSH 0.67 07/01/2016   Lab Results  Component Value Date   BUN 25 07/01/2016   BUN 23 (A) 11/20/2015   BUN 16 03/31/2015   Lab Results  Component Value Date   CREATININE 0.70 07/01/2016   CREATININE 0.6 11/20/2015   CREATININE 0.6 03/31/2015   Lab Results  Component Value Date   HGBA1C 5.1 07/01/2016   HGBA1C 5.5 09/23/2014   HGBA1C 5.5 11/05/2013       Assessment/Plan  1. Essential hypertension Controlled.CMP, future  2. Hyperglycemia Controlled -CMP, future -A1c, future  3. Multinodular thyroid Appears about the same size to me. TSH, future  4. Achalasia of esophagus improved  5. Long term current use of systemic steroids continue to reduce pednisone  6. Polymyalgia rheumatica (HCC) No pain  7. Actinic keratoses -Derm referral

## 2016-07-27 ENCOUNTER — Ambulatory Visit (INDEPENDENT_AMBULATORY_CARE_PROVIDER_SITE_OTHER): Payer: Medicare Other | Admitting: *Deleted

## 2016-07-27 ENCOUNTER — Telehealth: Payer: Self-pay | Admitting: Cardiology

## 2016-07-27 DIAGNOSIS — I495 Sick sinus syndrome: Secondary | ICD-10-CM

## 2016-07-27 NOTE — Progress Notes (Signed)
Remote pacemaker transmission.   

## 2016-07-27 NOTE — Telephone Encounter (Signed)
Spoke with pt and reminded pt of remote transmission that is due today. Pt verbalized understanding.   

## 2016-08-02 DIAGNOSIS — L821 Other seborrheic keratosis: Secondary | ICD-10-CM | POA: Diagnosis not present

## 2016-08-02 DIAGNOSIS — L570 Actinic keratosis: Secondary | ICD-10-CM | POA: Diagnosis not present

## 2016-08-02 LAB — CUP PACEART REMOTE DEVICE CHECK
Battery Remaining Longevity: 32 mo
Brady Statistic AP VP Percent: 2 %
Brady Statistic AS VS Percent: 80 %
Date Time Interrogation Session: 20180116191556
Implantable Lead Implant Date: 20001227
Implantable Lead Location: 753859
Implantable Lead Location: 753860
Implantable Pulse Generator Implant Date: 20090603
Lead Channel Pacing Threshold Amplitude: 0.5 V
Lead Channel Pacing Threshold Amplitude: 0.625 V
Lead Channel Pacing Threshold Pulse Width: 0.4 ms
Lead Channel Sensing Intrinsic Amplitude: 8 mV
Lead Channel Setting Pacing Amplitude: 2 V
Lead Channel Setting Pacing Amplitude: 2.5 V
Lead Channel Setting Pacing Pulse Width: 0.4 ms
MDC IDC LEAD IMPLANT DT: 20001227
MDC IDC MSMT BATTERY IMPEDANCE: 2151 Ohm
MDC IDC MSMT BATTERY VOLTAGE: 2.75 V
MDC IDC MSMT LEADCHNL RA IMPEDANCE VALUE: 424 Ohm
MDC IDC MSMT LEADCHNL RA PACING THRESHOLD PULSEWIDTH: 0.4 ms
MDC IDC MSMT LEADCHNL RA SENSING INTR AMPL: 1 mV
MDC IDC MSMT LEADCHNL RV IMPEDANCE VALUE: 649 Ohm
MDC IDC SET LEADCHNL RV SENSING SENSITIVITY: 4 mV
MDC IDC STAT BRADY AP VS PERCENT: 18 %
MDC IDC STAT BRADY AS VP PERCENT: 0 %

## 2016-08-04 ENCOUNTER — Encounter: Payer: Self-pay | Admitting: Cardiology

## 2016-08-18 ENCOUNTER — Encounter: Payer: Self-pay | Admitting: Cardiology

## 2016-10-04 DIAGNOSIS — L579 Skin changes due to chronic exposure to nonionizing radiation, unspecified: Secondary | ICD-10-CM | POA: Diagnosis not present

## 2016-10-25 ENCOUNTER — Encounter: Payer: Self-pay | Admitting: Internal Medicine

## 2016-10-25 ENCOUNTER — Ambulatory Visit (INDEPENDENT_AMBULATORY_CARE_PROVIDER_SITE_OTHER): Payer: Medicare Other | Admitting: Internal Medicine

## 2016-10-25 VITALS — BP 142/76 | HR 87 | Ht 61.0 in | Wt 147.0 lb

## 2016-10-25 DIAGNOSIS — I495 Sick sinus syndrome: Secondary | ICD-10-CM | POA: Diagnosis not present

## 2016-10-25 DIAGNOSIS — Z950 Presence of cardiac pacemaker: Secondary | ICD-10-CM

## 2016-10-25 DIAGNOSIS — I442 Atrioventricular block, complete: Secondary | ICD-10-CM

## 2016-10-25 MED ORDER — FUROSEMIDE 20 MG PO TABS: ORAL_TABLET | ORAL | 3 refills | Status: DC

## 2016-10-25 NOTE — Patient Instructions (Signed)
Medication Instructions: - Your physician has recommended you make the following change in your medication:  1) start lasix (furosemide) 20 mg- take one tablet by mouth once daily as needed for swelling  Labwork: - none ordered  Procedures/Testing: - none ordered  Follow-Up: - Remote monitoring is used to monitor your Pacemaker of ICD from home. This monitoring reduces the number of office visits required to check your device to one time per year. It allows [place] to keep an eye on the functioning of your device to ensure it is working properly. You are scheduled for a device check from home on 01/24/17. You may send your transmission at any time that day. If you have a wireless device, the transmission will be sent automatically. After your physician reviews your transmission, you will receive a postcard with your next transmission date.  - Your physician wants you to follow-up in: 1 year with Chanetta Marshall, NP for Dr. Caryl Comes. You will receive a reminder letter in the mail two months in advance. If you don't receive a letter, please call our office to schedule the follow-up appointment.   Any Additional Special Instructions Will Be Listed Below (If Applicable).     If you need a refill on your cardiac medications before your next appointment, please call your pharmacy.

## 2016-10-25 NOTE — Progress Notes (Signed)
Patient Care Team: Estill Dooms, MD as PCP - General (Internal Medicine) [facility] [name], MD as Consulting Physician (Cardiology) Irine Seal, MD as Attending Physician (Urology) Inda Castle, MD as Consulting Physician (Gastroenterology) Kathee Delton, MD as Consulting Physician (Pulmonary Disease) Grace Isaac, MD as Consulting Physician (Cardiothoracic Surgery)   HPI  [name] is a 83 y.o. female Seen in followup for pacemaker inserted for complete heart block and prior syncope. She underwent pacemaker implantation originally 06/1999 and underwent device generator replacement 5/09.  It is Medtronic device with 5076 leads.    She has hx of achalasia; treated reecently with botox with some improvement; is scheduled for balloon procedure  Office visit yesterday BP was 74/40;  She has no assoc symptoms Denies LH; sob or CP; no syncope   Records and Results Reviewed As above   Past Medical History:  Diagnosis Date  . Achalasia   . Anemia   . Arthritis    back  . Chronic steroid use   . Complete heart block ([place])    S/P PACEMAKER 2000 W/ GENERATOR CHANGE 2009  . Complete heart block ([place])   . Empyema, right ([place]) PULMOLOGIST-- DR CLANCE   VATS 06/23/2012 cultures negative to date CXR 07/19/12 persistent airfluid levels/  CXR 11-01-2012 IMPROVE RIGHT PLEURAL EFFUSION  . GERD (gastroesophageal reflux disease)   . H/O echocardiogram    NORMAL PER PT DONE AT [facility]  . History of aspiration pneumonitis    DEC 2013  . Hyperglycemia 07/17/2013  . Hypertension   . Inguinal hernia    right  . Intrinsic urethral sphincter deficiency   . Megaesophagus   . Mixed stress and urge urinary incontinence   . Multinodular thyroid 06/26/2012   Multi nodular goiter. Large nodules in both lobes of the gland.  These nodules fit national criteria for fine needle aspiration  biopsy if not previously assessed.    . Normal cardiac stress test    PER PT AT  [facility]  . OSA on CPAP    cpap, doees not know settings  . Pacemaker MEDTRONIC--  LAST PACER CHECK 05-09-2013  PER PT CHECK OUT OK   CARIOLOGIST--  DR RUTH [facility]FIELD AT [facility]--  PT HAS HAD NORMAL STRESS TEST AND ECHO   . Polymyalgia rheumatica (HCC)    on chronic Prednisone 5mg  daily  . RBBB   . S/P dilatation of esophageal stricture     Past Surgical History:  Procedure Laterality Date  . APPENDECTOMY  1953   w/ removal benign kidney tumor   . BALLOON DILATION  07/24/2012   Procedure: BALLOON DILATION;  Surgeon: Inda Castle, MD;  Location: [facility];  Service: Endoscopy;  Laterality: N/A;  . BALLOON DILATION N/A 12/03/2015   Procedure: BALLOON DILATION;  Surgeon: Mauri Pole, MD;  Location: [facility];  Service: Endoscopy;  Laterality: N/A;  pnuematic balloon  . BOTOX INJECTION  08/07/2012   Procedure: BOTOX INJECTION;  Surgeon: Inda Castle, MD;  Location: [facility];  Service: Endoscopy;  Laterality: N/A;  . BOTOX INJECTION N/A 05/24/2013   Procedure: MACROPLASTIQUE IMPLANT;  Surgeon: Irine Seal, MD;  Location: [facility];  Service: Urology;  Laterality: N/A;  . BOTOX INJECTION  02/25/2014   Procedure: BOTOX INJECTION;  Surgeon: Inda Castle, MD;  Location: [facility];  Service: Endoscopy;;  . CARDIAC PACEMAKER PLACEMENT  06/1999  DR Rod Holler [facility] AT [place]  (  LAST PACER CHECK 05-09-2013) for CHB/   END-OF-LIFE GENERATOR CHANGE  2009  . CATARACT EXTRACTION W/ INTRAOCULAR LENS  IMPLANT, BILATERAL  2005  . CATARACT EXTRACTION W/ INTRAOCULAR LENS  IMPLANT, BILATERAL    . DILATION AND CURETTAGE OF UTERUS    . ESOPHAGOGASTRODUODENOSCOPY  07/24/2012   Procedure: ESOPHAGOGASTRODUODENOSCOPY (EGD);  Surgeon: Inda Castle, MD;  Location: [facility];  Service: Endoscopy;  Laterality: N/A;  . ESOPHAGOGASTRODUODENOSCOPY  08/07/2012   Procedure: ESOPHAGOGASTRODUODENOSCOPY (EGD);  Surgeon: Inda Castle, MD;  Location: [facility];  Service:  Endoscopy;  Laterality: N/A;  . ESOPHAGOGASTRODUODENOSCOPY N/A 02/25/2014   Procedure: ESOPHAGOGASTRODUODENOSCOPY (EGD);  Surgeon: Inda Castle, MD;  Location: [facility];  Service: Endoscopy;  Laterality: N/A;  . ESOPHAGOGASTRODUODENOSCOPY (EGD) WITH ESOPHAGEAL DILATION  06/27/2012   Procedure: ESOPHAGOGASTRODUODENOSCOPY (EGD) WITH ESOPHAGEAL DILATION;  Surgeon: Ladene Artist, MD,FACG;  Location: [facility];  Service: Endoscopy;  Laterality: N/A;  . ESOPHAGOGASTRODUODENOSCOPY (EGD) WITH PROPOFOL N/A 10/09/2015   Procedure: ESOPHAGOGASTRODUODENOSCOPY (EGD) WITH PROPOFOL ( WITH BOTOX);  Surgeon: Milus Banister, MD;  Location: [facility];  Service: Endoscopy;  Laterality: N/A;  . ESOPHAGOGASTRODUODENOSCOPY (EGD) WITH PROPOFOL N/A 12/03/2015   Procedure: ESOPHAGOGASTRODUODENOSCOPY (EGD) WITH PROPOFOL;  Surgeon: Mauri Pole, MD;  Location: [facility];  Service: Endoscopy;  Laterality: N/A;  . JOINT REPLACEMENT     right hip, 2001  . PACEMAKER GENERATOR CHANGE  12/13/2007   at [facility]  . TOE SURGERY  2013   left 3rd toe HAMMERTOE REPAIR  . TONSILLECTOMY  AS CHILD  . TOTAL HIP ARTHROPLASTY Right 2001  . VERICOSE VEIN LIGATION    . VIDEO ASSISTED THORACOSCOPY (VATS)/DECORTICATION  06/23/2012   Procedure: VIDEO ASSISTED THORACOSCOPY (VATS)/DECORTICATION;  Surgeon: Grace Isaac, MD;  Location: [facility];  Service: Thoracic;  Laterality: Right;  Marland Kitchen VIDEO ASSISTED THORACOSCOPY (VATS)/EMPYEMA     06/23/2012  . VIDEO BRONCHOSCOPY  06/23/2012   Procedure: VIDEO BRONCHOSCOPY;  Surgeon: Grace Isaac, MD;  Location: [facility] OR;  Service: Thoracic;  Laterality: N/A;    Current Outpatient Prescriptions  Medication Sig Dispense Refill  . acetaminophen (TYLENOL) 325 MG tablet Take 650 mg by mouth every 6 (six) hours as needed for moderate pain (arthritis pain). Reported on 08/18/2015    . ascorbic acid (VITAMIN C) 500 MG tablet Take 500 mg by mouth daily. Reported on 08/26/2015    . b complex  vitamins capsule Take 1 capsule by mouth daily.    . Calcium Carbonate (CALCIUM 600 PO) Take 600 mg by mouth daily.    . Cholecalciferol (VITAMIN D-3) 1000 units CAPS Take 1,000 Units by mouth daily.     . Omega 3 1200 MG CAPS Take 1,200 mg by mouth at bedtime.     . Probiotic Product (PROBIOTIC DAILY PO) Take 1 capsule by mouth daily.     . ranitidine (ZANTAC) 150 MG tablet Take 150 mg by mouth 2 (two) times daily. Take one tablet in morning and one in evening     No current facility-administered medications for this visit.     Allergies  Allergen Reactions  . Actonel [Risedronate Sodium] Other (See Comments)    Joint aches; rechallenged --caused joint aches  . Ivp Dye [Iodinated Diagnostic Agents] Nausea And Vomiting      Review of Systems negative except from HPI and PMH  Physical Exam BP (!) 142/76   Pulse 87   Ht 5\' 1"  (1.549 m)   Wt 147 lb (66.7 kg)   SpO2 93%  BMI 27.78 kg/m  Well developed and well nourished in no acute distress HENT normal E scleral and icterus clear Neck Supple JVP flat; carotids brisk and full kyphosis Clear to ausculation Regular rate and rhythm, 2/6 systolic with preserved S2  Soft with active bowel sounds No clubbing cyanosis tr Edema Alert and oriented, grossly normal motor and sensory function Skin Warm and Dry  Personally reviewed   ECG sinus 79 23/14/40 RBBB Axis 92   Assessment and  Plan  Complete Heart Block-intermittent  Sick Sinus syndrome   Atrial fibrillation   Edema  Aortic stenosis  Pacemaker Medtronic The patient's device was interrogated.  The information was reviewed. No changes were made in the programming.     BP well controlled  No syncope  No intercurrent atrial fibrillation or flutter  On examination she has aortic stenosis/sclerosis.  She has preserved S2 and a reasonable carotid upstroke. The absence of further symptoms will not pursue this.  She has a little bit of edema. We will put her on a  when necessary diuretic. Her blood pressure is elevated but not so much but if persistently at [facility] possibly chlorthalidone would be reasonable

## 2016-10-28 ENCOUNTER — Encounter: Payer: Medicare Other | Admitting: Internal Medicine

## 2016-10-29 ENCOUNTER — Encounter: Payer: Medicare Other | Admitting: Internal Medicine

## 2016-11-15 ENCOUNTER — Other Ambulatory Visit: Payer: Self-pay | Admitting: Internal Medicine

## 2016-12-15 DIAGNOSIS — E042 Nontoxic multinodular goiter: Secondary | ICD-10-CM | POA: Diagnosis not present

## 2016-12-15 DIAGNOSIS — R739 Hyperglycemia, unspecified: Secondary | ICD-10-CM | POA: Diagnosis not present

## 2016-12-15 DIAGNOSIS — I10 Essential (primary) hypertension: Secondary | ICD-10-CM | POA: Diagnosis not present

## 2016-12-16 LAB — COMPREHENSIVE METABOLIC PANEL
ALBUMIN: 3.6 g/dL (ref 3.6–5.1)
ALT: 7 U/L (ref 6–29)
AST: 15 U/L (ref 10–35)
Alkaline Phosphatase: 68 U/L (ref 33–130)
BUN: 20 mg/dL (ref 7–25)
CHLORIDE: 107 mmol/L (ref 98–110)
CO2: 26 mmol/L (ref 20–31)
Calcium: 9.3 mg/dL (ref 8.6–10.4)
Creat: 0.74 mg/dL (ref 0.60–0.88)
Glucose, Bld: 95 mg/dL (ref 65–99)
POTASSIUM: 3.9 mmol/L (ref 3.5–5.3)
SODIUM: 143 mmol/L (ref 135–146)
TOTAL PROTEIN: 6.2 g/dL (ref 6.1–8.1)
Total Bilirubin: 0.4 mg/dL (ref 0.2–1.2)

## 2016-12-16 LAB — TSH: TSH: 0.8 m[IU]/L

## 2016-12-17 LAB — HEMOGLOBIN A1C
HEMOGLOBIN A1C: 5.2 % (ref <5.7)
MEAN PLASMA GLUCOSE: 103 mg/dL

## 2016-12-21 ENCOUNTER — Non-Acute Institutional Stay: Payer: Medicare Other | Admitting: Internal Medicine

## 2016-12-21 ENCOUNTER — Encounter: Payer: Self-pay | Admitting: Internal Medicine

## 2016-12-21 VITALS — BP 150/80 | HR 80 | Temp 97.7°F | Ht 61.0 in | Wt 146.0 lb

## 2016-12-21 DIAGNOSIS — N3946 Mixed incontinence: Secondary | ICD-10-CM

## 2016-12-21 DIAGNOSIS — K220 Achalasia of cardia: Secondary | ICD-10-CM

## 2016-12-21 DIAGNOSIS — R739 Hyperglycemia, unspecified: Secondary | ICD-10-CM

## 2016-12-21 DIAGNOSIS — I10 Essential (primary) hypertension: Secondary | ICD-10-CM

## 2016-12-21 NOTE — Progress Notes (Signed)
Facility  [facility]    Place of Service: Clinic (12)     Allergies  Allergen Reactions  . Actonel [Risedronate Sodium] Other (See Comments)    Joint aches; rechallenged --caused joint aches  . Ivp Dye [Iodinated Diagnostic Agents] Nausea And Vomiting    Chief Complaint  Patient presents with  . Medical Management of Chronic Issues    6 month medication management of blood pressure, hyperglycemia, thyroid, labs.     HPI:  Achalasia of esophagus - having more episodes of food hanging in esophagus. No pain.  Essential hypertension - initial BP high, but she had rushed to get to clinic. 2ND Bp better. No headache, chest pain, or palpitations  Hyperglycemia - controlled  Urge and stress incontinence - continues to have problems with urgency and incontinence.  Medications: Patient's Medications  New Prescriptions   No medications on file  Previous Medications   ACETAMINOPHEN (TYLENOL) 325 MG TABLET    Take 650 mg by mouth every 6 (six) hours as needed for moderate pain (arthritis pain). Reported on 08/18/2015   ASCORBIC ACID (VITAMIN C) 500 MG TABLET    Take 500 mg by mouth daily. Reported on 08/26/2015   B COMPLEX VITAMINS CAPSULE    Take 1 capsule by mouth daily.   CALCIUM CARBONATE (CALCIUM 600 PO)    Take 600 mg by mouth daily.   CHOLECALCIFEROL (VITAMIN D-3) 1000 UNITS CAPS    Take 1,000 Units by mouth daily.    FUROSEMIDE (LASIX) 20 MG TABLET    Take one tablet (20 mg) by mouth once daily AS NEEDED for swelling   OMEGA 3 1200 MG CAPS    Take 1,200 mg by mouth at bedtime.    PROBIOTIC PRODUCT (PROBIOTIC DAILY PO)    Take 1 capsule by mouth daily.    RANITIDINE (ZANTAC) 150 MG TABLET    Take 150 mg by mouth 2 (two) times daily. Take one tablet in morning and one in evening  Modified Medications   No medications on file  Discontinued Medications   No medications on file     Review of Systems  Constitutional: Negative for activity change, appetite change, fatigue and fever.        Mildly overweight.  Eyes: Negative.   Respiratory:       Aspiration pneumonia December 3491 complicated by subsequent empyema. Appears fully recovered as of 07/17/13.  Cardiovascular: Negative for chest pain, palpitations and leg swelling.  Gastrointestinal:       Known achalasia and megaesophagus. Some dysphagia.   Endocrine: Negative.   Genitourinary:       Chronic urinary leakage. Wears protective pads for at least 10 years. She has seen urologist in the past. Sling surgery was proposed, but she did not want to go through this.  Musculoskeletal: Positive for myalgias. Negative for back pain and gait problem.       History of polymyalgia rheumatica. No pains at present. Continues on prednisone 0.5 mg about twice weekly. We tried to take her off of this medication tapering it a year ago, but she could not tolerate the resumption of myalgias.  Skin: Negative.   Allergic/Immunologic: Negative.   Neurological: Negative.   Hematological:       History of anemia.  Psychiatric/Behavioral: Negative.     Vitals:   12/21/16 1010  BP: (!) 168/92  150/80  Pulse: 80                                                80  Temp: 97.7 F (36.5 C)  TempSrc: Oral  SpO2: 99%  Weight: 146 lb (66.2 kg)  Height: _0  (1.549 m)   Wt Readings from Last 3 Encounters:  12/21/16 146 lb (66.2 kg)  10/25/16 147 lb (66.7 kg)  07/06/16 143 lb (64.9 kg)    Body mass index is 27.59 kg/m.  Physical Exam  Constitutional: She is oriented to person, place, and time. She appears well-developed and well-nourished. No distress.  HENT:  Head: Normocephalic and atraumatic.  Right Ear: External ear normal.  Left Ear: External ear normal.  Nose: Nose normal.  Mouth/Throat: Oropharynx is clear and moist.  Eyes:  Corrective lenses worn.  Neck: Thyromegaly present.  Multinodular goiter with nodules palpable in all 4 lobes. Cyst in the right upper lobe of the thyroid is  particularly large.  Cardiovascular: Normal rate, regular rhythm and intact distal pulses.  Exam reveals friction rub. Exam reveals no gallop.   No murmur heard. Pulmonary/Chest: Effort normal and breath sounds normal. No respiratory distress. She has no wheezes. She has no rales. She exhibits no tenderness.  Abdominal: Soft. Bowel sounds are normal. She exhibits no distension and no mass. There is no tenderness.  Musculoskeletal: Normal range of motion. She exhibits no edema or tenderness.  Left leg is shorter than the right. Left 2nd and 4th hammer toes. Left 3rd toe had corrective surgery for hammer toe. History of entry of the left index finger by an immersion lender. Complete healing, but the tip of the finger is numb and exquisitely tender at times.  Neurological: She is alert and oriented to person, place, and time. She has normal reflexes. No cranial nerve deficit. Coordination normal.  07/06/16 MMSE 30/30. Passed clock drawing/  Skin: No rash noted. No erythema.  Psychiatric: She has a normal mood and affect. Her behavior is normal. Judgment and thought content normal.     Labs reviewed: Lab Summary Latest Ref Rng & Units 12/15/2016 07/01/2016 11/20/2015  Hemoglobin 11.7 - 15.5 g/dL (None) 13.0 (None)  Hematocrit 35.0 - 45.0 % (None) 39.8 (None)  White count 3.8 - 10.8 K/uL (None) 5.1 (None)  Platelet count 140 - 400 K/uL (None) 117(L) (None)  Sodium 135 - 146 mmol/L 143 141 138  Potassium 3.5 - 5.3 mmol/L 3.9 4.4 4.1  Calcium 8.6 - 10.4 mg/dL 9.3 9.4 (None)  Phosphorus - (None) (None) (None)  Creatinine 0.60 - 0.88 mg/dL 0.74 0.70 0.6  AST 10 - 35 U/L _1 Alk Phos 33 - 130 U/L 68 75 70  Bilirubin 0.2 - 1.2 mg/dL 0.4 0.5 (None)  Glucose 65 - 99 mg/dL 95 105(H) 99  Cholesterol <200 mg/dL (None) 158 (None)  HDL cholesterol >50 mg/dL (None) 71 (None)  Triglycerides <150 mg/dL (None) 57 (None)  LDL Direct - (None) (None) (None)  LDL Calc <100 mg/dL (None) 76 (None)  Total  protein 6.1 - 8.1 g/dL 6.2 6.8 (None)  Albumin 3.6 - 5.1 g/dL 3.6 3.8 (None)  Some recent data might be hidden   Lab Results  Component Value Date   TSH 0.80 12/15/2016   Lab Results  Component Value Date   BUN 20 12/15/2016   BUN  25 07/01/2016   BUN 23 (A) 11/20/2015   Lab Results  Component Value Date   CREATININE 0.74 12/15/2016   CREATININE 0.70 07/01/2016   CREATININE 0.6 11/20/2015   Lab Results  Component Value Date   HGBA1C 5.2 12/15/2016   HGBA1C 5.1 07/01/2016   HGBA1C 5.5 09/23/2014       Assessment/Plan  1. Achalasia of esophagus - Ambulatory referral to Gastroenterology  2. Essential hypertension The current medical regimen is effective;  continue present plan and medications.  3. Hyperglycemia stable  4. Urge and stress incontinence - Ambulatory referral to Urology

## 2017-01-05 DIAGNOSIS — N3946 Mixed incontinence: Secondary | ICD-10-CM | POA: Diagnosis not present

## 2017-01-05 DIAGNOSIS — R3915 Urgency of urination: Secondary | ICD-10-CM | POA: Diagnosis not present

## 2017-01-24 ENCOUNTER — Telehealth: Payer: Self-pay | Admitting: Cardiology

## 2017-01-24 ENCOUNTER — Ambulatory Visit (INDEPENDENT_AMBULATORY_CARE_PROVIDER_SITE_OTHER): Payer: Medicare Other | Admitting: *Deleted

## 2017-01-24 DIAGNOSIS — I442 Atrioventricular block, complete: Secondary | ICD-10-CM

## 2017-01-24 NOTE — Telephone Encounter (Signed)
LMOVM reminding pt to send remote transmission.   

## 2017-01-25 LAB — CUP PACEART REMOTE DEVICE CHECK
Battery Impedance: 2298 Ohm
Brady Statistic AS VP Percent: 0 %
Brady Statistic AS VS Percent: 73 %
Date Time Interrogation Session: 20180717142419
Implantable Lead Implant Date: 20001227
Implantable Lead Implant Date: 20001227
Implantable Lead Location: 753859
Implantable Lead Model: 5076
Implantable Lead Model: 5076
Lead Channel Impedance Value: 621 Ohm
Lead Channel Pacing Threshold Amplitude: 0.5 V
Lead Channel Setting Pacing Amplitude: 2.5 V
Lead Channel Setting Pacing Pulse Width: 0.4 ms
Lead Channel Setting Sensing Sensitivity: 4 mV
MDC IDC LEAD LOCATION: 753860
MDC IDC MSMT BATTERY REMAINING LONGEVITY: 29 mo
MDC IDC MSMT BATTERY VOLTAGE: 2.73 V
MDC IDC MSMT LEADCHNL RA IMPEDANCE VALUE: 436 Ohm
MDC IDC MSMT LEADCHNL RA PACING THRESHOLD AMPLITUDE: 0.5 V
MDC IDC MSMT LEADCHNL RA PACING THRESHOLD PULSEWIDTH: 0.4 ms
MDC IDC MSMT LEADCHNL RV PACING THRESHOLD PULSEWIDTH: 0.4 ms
MDC IDC PG IMPLANT DT: 20090603
MDC IDC SET LEADCHNL RA PACING AMPLITUDE: 2 V
MDC IDC STAT BRADY AP VP PERCENT: 8 %
MDC IDC STAT BRADY AP VS PERCENT: 18 %

## 2017-01-25 NOTE — Progress Notes (Signed)
Remote pacemaker transmission.   

## 2017-01-26 ENCOUNTER — Encounter: Payer: Self-pay | Admitting: Cardiology

## 2017-01-31 ENCOUNTER — Encounter: Payer: Self-pay | Admitting: Internal Medicine

## 2017-02-07 DIAGNOSIS — R3915 Urgency of urination: Secondary | ICD-10-CM | POA: Diagnosis not present

## 2017-02-07 DIAGNOSIS — N3946 Mixed incontinence: Secondary | ICD-10-CM | POA: Diagnosis not present

## 2017-02-22 ENCOUNTER — Encounter: Payer: Self-pay | Admitting: Gastroenterology

## 2017-02-22 ENCOUNTER — Ambulatory Visit (INDEPENDENT_AMBULATORY_CARE_PROVIDER_SITE_OTHER): Payer: Medicare Other | Admitting: Gastroenterology

## 2017-02-22 VITALS — BP 130/76 | HR 72 | Ht 61.0 in | Wt 149.5 lb

## 2017-02-22 DIAGNOSIS — R1319 Other dysphagia: Secondary | ICD-10-CM

## 2017-02-22 DIAGNOSIS — R1310 Dysphagia, unspecified: Secondary | ICD-10-CM

## 2017-02-22 DIAGNOSIS — K220 Achalasia of cardia: Secondary | ICD-10-CM | POA: Diagnosis not present

## 2017-02-22 NOTE — Progress Notes (Signed)
         [name]    [id number]    04/16/1929  Primary Care Physician:Pandey, Mahima, MD  Referring Physician: Green, Arthur G, MD [address] [place], [place] [address]  Chief complaint:  Dysphagia  HPI:  83-year-old female with history of achalasia status post Botox injection March 2017 , pneumatic balloon dilation with 30 mm Rigiflex balloon in May 2017 here for follow-up visit with complaints of worsening dysphagia. She had significant improvement in her symptoms after pneumatic balloon dilation, was last seen in office in July 2017. She feels over the past 1-2 months she has having more difficulty swallowing intermittently and also is regurgitating food back. She has gained weight ~ 25 lbs in the past 1 year. Denies any change in bowel habits, melena or blood per rectum.    Outpatient Encounter Prescriptions as of 02/22/2017  Medication Sig  . acetaminophen (TYLENOL) 325 MG tablet Take 650 mg by mouth every 6 (six) hours as needed for moderate pain (arthritis pain). Reported on 08/18/2015  . ascorbic acid (VITAMIN C) 500 MG tablet Take 500 mg by mouth daily. Reported on 08/26/2015  . b complex vitamins capsule Take 1 capsule by mouth daily.  . Calcium Carbonate (CALCIUM 600 PO) Take 600 mg by mouth daily.  . Cholecalciferol (VITAMIN D-3) 1000 units CAPS Take 1,000 Units by mouth daily.   . furosemide (LASIX) 20 MG tablet Take one tablet (20 mg) by mouth once daily AS NEEDED for swelling  . Multiple Vitamin (MULTIVITAMIN) tablet Take 1 tablet by mouth daily.  . Omega 3 1200 MG CAPS Take 1,200 mg by mouth at bedtime.   . Probiotic Product (PROBIOTIC DAILY PO) Take 1 capsule by mouth daily.   . ranitidine (ZANTAC) 150 MG tablet Take 150 mg by mouth 2 (two) times daily. Take one tablet in morning and one in evening   No facility-administered encounter medications on file as of 02/22/2017.     Allergies as of 02/22/2017 - Review Complete 02/22/2017  Allergen Reaction  Noted  . Actonel [risedronate sodium] Other (See Comments) 06/19/2012  . Ivp dye [iodinated diagnostic agents] Nausea And Vomiting 06/19/2012    Past Medical History:  Diagnosis Date  . Achalasia   . Anemia   . Arthritis    back  . Chronic steroid use   . Complete heart block (HCC)    S/P PACEMAKER 2000 W/ GENERATOR CHANGE 2009  . Complete heart block (HCC)   . Empyema, right (HCC) PULMOLOGIST-- DR CLANCE   VATS 06/23/2012 cultures negative to date CXR 07/19/12 persistent airfluid levels/  CXR 11-01-2012 IMPROVE RIGHT PLEURAL EFFUSION  . GERD (gastroesophageal reflux disease)   . H/O echocardiogram    NORMAL PER PT DONE AT [facility]  . History of aspiration pneumonitis    DEC 2013  . Hyperglycemia 07/17/2013  . Hypertension   . Inguinal hernia    right  . Intrinsic urethral sphincter deficiency   . Megaesophagus   . Mixed stress and urge urinary incontinence   . Multinodular thyroid 06/26/2012   Multi nodular goiter. Large nodules in both lobes of the gland.  These nodules fit national criteria for fine needle aspiration  biopsy if not previously assessed.    . Normal cardiac stress test    PER PT AT [facility]  . OSA on CPAP    cpap, doees not know settings  . Pacemaker [employer]ONIC--  LAST PACER CHECK 05-09-2013  PER PT CHECK OUT OK     CARIOLOGIST--  DR RUTH GREENFIELD AT [facility]--  PT HAS HAD NORMAL STRESS TEST AND ECHO   . Polymyalgia rheumatica (HCC)    on chronic Prednisone 5mg daily  . RBBB   . S/P dilatation of esophageal stricture     Past Surgical History:  Procedure Laterality Date  . APPENDECTOMY  1953   w/ removal benign kidney tumor   . BALLOON DILATION  07/24/2012   Procedure: BALLOON DILATION;  Surgeon: Robert D Kaplan, MD;  Location: [facility];  Service: Endoscopy;  Laterality: N/A;  . BALLOON DILATION N/A 12/03/2015   Procedure: BALLOON DILATION;  Surgeon: Kavitha Nandigam V, MD;  Location: [facility];  Service: Endoscopy;  Laterality: N/A;  pnuematic balloon  .  BOTOX INJECTION  08/07/2012   Procedure: BOTOX INJECTION;  Surgeon: Robert D Kaplan, MD;  Location: [facility];  Service: Endoscopy;  Laterality: N/A;  . BOTOX INJECTION N/A 05/24/2013   Procedure: MACROPLASTIQUE IMPLANT;  Surgeon: John Wrenn, MD;  Location: [facility];  Service: Urology;  Laterality: N/A;  . BOTOX INJECTION  02/25/2014   Procedure: BOTOX INJECTION;  Surgeon: Robert D Kaplan, MD;  Location: [facility];  Service: Endoscopy;;  . CARDIAC PACEMAKER PLACEMENT  06/1999  DR RUTH GREENFIELD AT [facility]    [employer]  ( LAST PACER CHECK 05-09-2013) for CHB/   END-OF-LIFE GENERATOR CHANGE  2009  . CATARACT EXTRACTION W/ INTRAOCULAR LENS  IMPLANT, BILATERAL  2005  . CATARACT EXTRACTION W/ INTRAOCULAR LENS  IMPLANT, BILATERAL    . DILATION AND CURETTAGE OF UTERUS    . ESOPHAGOGASTRODUODENOSCOPY  07/24/2012   Procedure: ESOPHAGOGASTRODUODENOSCOPY (EGD);  Surgeon: Robert D Kaplan, MD;  Location: [facility];  Service: Endoscopy;  Laterality: N/A;  . ESOPHAGOGASTRODUODENOSCOPY  08/07/2012   Procedure: ESOPHAGOGASTRODUODENOSCOPY (EGD);  Surgeon: Robert D Kaplan, MD;  [facility]: [facility];  Service: Endoscopy;  Laterality: N/A;  . ESOPHAGOGASTRODUODENOSCOPY N/A 02/25/2014   Procedure: ESOPHAGOGASTRODUODENOSCOPY (EGD);  Surgeon: Robert D Kaplan, MD;  Location: [facility];  Service: Endoscopy;  Laterality: N/A;  . ESOPHAGOGASTRODUODENOSCOPY (EGD) WITH ESOPHAGEAL DILATION  06/27/2012   Procedure: ESOPHAGOGASTRODUODENOSCOPY (EGD) WITH ESOPHAGEAL DILATION;  Surgeon: Malcolm T Stark, MD,FACG;  Location: [facility];  Service: Endoscopy;  Laterality: N/A;  . ESOPHAGOGASTRODUODENOSCOPY (EGD) WITH PROPOFOL N/A 10/09/2015   Procedure: ESOPHAGOGASTRODUODENOSCOPY (EGD) WITH PROPOFOL ( WITH BOTOX);  Surgeon: Daniel P Jacobs, MD;  Location: [facility];  Service: Endoscopy;  Laterality: N/A;  . ESOPHAGOGASTRODUODENOSCOPY (EGD) WITH PROPOFOL N/A 12/03/2015   Procedure:  ESOPHAGOGASTRODUODENOSCOPY (EGD) WITH PROPOFOL;  Surgeon: Kavitha Nandigam V, MD;  Location: [facility];  Service: Endoscopy;  Laterality: N/A;  . JOINT REPLACEMENT     right hip, 2001  . PACEMAKER GENERATOR CHANGE  12/13/2007   at [facility]  . TOE SURGERY  2013   left 3rd toe HAMMERTOE REPAIR  . TONSILLECTOMY  AS CHILD  . TOTAL HIP ARTHROPLASTY Right 2001  . VERICOSE VEIN LIGATION    . VIDEO ASSISTED THORACOSCOPY (VATS)/DECORTICATION  06/23/2012   Procedure: VIDEO ASSISTED THORACOSCOPY (VATS)/DECORTICATION;  Surgeon: Edward B Gerhardt, MD;  Location: [facility];  Service: Thoracic;  Laterality: Right;  . VIDEO ASSISTED THORACOSCOPY (VATS)/EMPYEMA     06/23/2012  . VIDEO BRONCHOSCOPY  06/23/2012   Procedure: VIDEO BRONCHOSCOPY;  Surgeon: Edward B Gerhardt, MD;  Location: [facility];  Service: Thoracic;  Laterality: N/A;    Family History  Problem Relation Age of Onset  . Arthritis Father   . Heart disease Father   . Cancer Brother        ?  spine  . Heart disease Brother   . Lung cancer Brother   . Diabetes Son   . Heart disease Mother        Congestive heart failure  . Heart disease Brother   . Arthritis Brother   . Hypertension Brother   . Colon cancer Neg Hx     Social History   Social History  . Marital status: Married    Spouse name: N/A  . Number of children: 9  . Years of education: N/A   Occupational History  . Retired Adminstrated assistance    Social History Main Topics  . Smoking status: Passive Smoke Exposure - Never Smoker  . Smokeless tobacco: Never Used     Comment: Exposure through father only.  . Alcohol use 1.8 oz/week    3 Glasses of wine per week     Comment: 1 drink x 3 days/week  . Drug use: No  . Sexual activity: No     Comment: married   Other Topics Concern  . Not on file   Social History Narrative   Lives at [facility] since 05/18/2012   Married   Pacemaker   POA   Never smoked   Alcohol-wine 2-3 nights a week    Exercise - exercise  classes 3 days a week    [employer] at [employer]      [facility]:   Originally from [place]. Has lived in [place], [place], [place], & moved to [place] in 1961. Previously worked doing administrative assistant work. No pets currently. No bird exposure.       Review of systems: Review of Systems  Constitutional: Negative for fever and chills.  HENT: Negative.   Eyes: Negative for blurred vision.  Respiratory: Negative for cough, shortness of breath and wheezing.   Cardiovascular: Negative for chest pain and palpitations.  Gastrointestinal: as per HPI Genitourinary: Negative for dysuria, urgency, frequency and hematuria.  Musculoskeletal: Positive for myalgias, back pain and joint pain.  Skin: Negative for itching and rash.  Neurological: Negative for dizziness, tremors, focal weakness, seizures and loss of consciousness.  Endo/Heme/Allergies: Positive for seasonal allergies.  Psychiatric/Behavioral: Negative for depression, suicidal ideas and hallucinations.  All other systems reviewed and are negative.   Physical Exam: Vitals:   02/22/17 0849  BP: 130/76  Pulse: 72   Body mass index is 28.25 kg/m. Gen:      No acute distress HEENT:  EOMI, sclera anicteric Neck:     No masses; no thyromegaly Lungs:    Clear to auscultation bilaterally; normal respiratory effort CV:         Regular rate and rhythm; no murmurs Abd:      + bowel sounds; soft, non-tender; no palpable masses, no distension Ext:   1+ edema; adequate peripheral perfusion Skin:      Warm and dry; no rash Neuro: alert and oriented x 3 Psych: normal mood and affect  Data Reviewed:   Reviewed labs, radiology imaging, old records and pertinent past GI work up   Assessment and Plan/Recommendations:  83-year-old female with type II achalasia status post Botox injection and pneumatic balloon dilation here with complaints of worsening dysphagia with regurgitation which had improved after  pneumatic dilation a year ago We will schedule barium esophagram to evaluate flow of barium through LES Schedule for repeat EGD with possible pneumatic dilation at [facility], patient will need to be intubated for the procedure under general anesthesia given significant esophageal   dilation with retained secretions due to achalasia The risks and benefits as well as alternatives of endoscopic procedure(s) have been discussed and reviewed. All questions answered. The patient agrees to proceed.   K. Veena Nandigam , MD [phone] Mon-Fri 8a-5p [phone] after 5p, weekends, holidays  CC: Green, Arthur G, MD   

## 2017-02-22 NOTE — Patient Instructions (Addendum)
You have been scheduled for a Barium Esophogram at [facility] (1st floor of the hospital) on 02/25/2017 at [place]. Please arrive 15 minutes prior to your appointment for registration. Make certain not to have anything to eat or drink 6 hours prior to your test. If you need to reschedule for any reason, please contact radiology at [phone] to do so. __________________________________________________________________ A barium swallow is an examination that concentrates on views of the esophagus. This tends to be a double contrast exam (barium and two liquids which, when combined, create a gas to distend the wall of the oesophagus) or single contrast (non-ionic iodine based). The study is usually tailored to your symptoms so a good history is essential. Attention is paid during the study to the form, structure and configuration of the esophagus, looking for functional disorders (such as aspiration, dysphagia, achalasia, motility and reflux) EXAMINATION You may be asked to change into a gown, depending on the type of swallow being performed. A radiologist and radiographer will perform the procedure. The radiologist will advise you of the type of contrast selected for your procedure and direct you during the exam. You will be asked to stand, sit or lie in several different positions and to hold a small amount of fluid in your mouth before being asked to swallow while the imaging is performed .In some instances you may be asked to swallow barium coated marshmallows to assess the motility of a solid food bolus. The exam can be recorded as a digital or video fluoroscopy procedure. POST PROCEDURE It will take 1-2 days for the barium to pass through your system. To facilitate this, it is important, unless otherwise directed, to increase your fluids for the next 24-48hrs and to resume your normal diet.  This test typically takes about 30 minutes to  perform. __________________________________________________________________________________

## 2017-02-23 ENCOUNTER — Telehealth: Payer: Self-pay | Admitting: *Deleted

## 2017-02-23 NOTE — Telephone Encounter (Signed)
Patients procedure is scheduled at [facility] for EGD with Botox injection, Pneumatics and Rigiflex dilation  9-5 at 10:15am to arrive by 8:45am  Patient is to have a DG Esophagus with water two hours after her procedure patient is aware that she will need to be at [facility] for about 4-5 hours that day

## 2017-02-25 ENCOUNTER — Ambulatory Visit (HOSPITAL_COMMUNITY): Admission: RE | Admit: 2017-02-25 | Payer: Medicare Other | Source: Ambulatory Visit

## 2017-03-07 DIAGNOSIS — N952 Postmenopausal atrophic vaginitis: Secondary | ICD-10-CM | POA: Diagnosis not present

## 2017-03-07 DIAGNOSIS — R3915 Urgency of urination: Secondary | ICD-10-CM | POA: Diagnosis not present

## 2017-03-15 ENCOUNTER — Encounter (HOSPITAL_COMMUNITY): Payer: Self-pay | Admitting: *Deleted

## 2017-03-15 NOTE — Progress Notes (Addendum)
Spoke with pt for pre-op call. Pt has a pacemaker due to complete heart block. Pt is followed by Dr. Caryl Comes. She denies any recent chest pain or sob. Pt is not diabetic.  EKG - 10/15/16 - in EPIC Echo - 2010 - in [place]

## 2017-03-16 ENCOUNTER — Ambulatory Visit (HOSPITAL_COMMUNITY)
Admission: RE | Admit: 2017-03-16 | Discharge: 2017-03-16 | Disposition: A | Discharge status: Home / Self Care | Payer: Medicare Other | Source: Ambulatory Visit | Attending: Gastroenterology | Admitting: Gastroenterology

## 2017-03-16 ENCOUNTER — Ambulatory Visit (HOSPITAL_COMMUNITY): Payer: Medicare Other | Admitting: Anesthesiology

## 2017-03-16 ENCOUNTER — Telehealth: Payer: Self-pay

## 2017-03-16 ENCOUNTER — Encounter (HOSPITAL_COMMUNITY)
Admission: RE | Disposition: A | Discharge status: Home / Self Care | Payer: Self-pay | Source: Ambulatory Visit | Attending: Gastroenterology

## 2017-03-16 ENCOUNTER — Ambulatory Visit (HOSPITAL_COMMUNITY): Payer: Medicare Other

## 2017-03-16 ENCOUNTER — Encounter (HOSPITAL_COMMUNITY): Payer: Self-pay | Admitting: Gastroenterology

## 2017-03-16 DIAGNOSIS — K2970 Gastritis, unspecified, without bleeding: Secondary | ICD-10-CM | POA: Insufficient documentation

## 2017-03-16 DIAGNOSIS — T189XXA Foreign body of alimentary tract, part unspecified, initial encounter: Secondary | ICD-10-CM | POA: Diagnosis not present

## 2017-03-16 DIAGNOSIS — R1319 Other dysphagia: Secondary | ICD-10-CM

## 2017-03-16 DIAGNOSIS — K220 Achalasia of cardia: Secondary | ICD-10-CM

## 2017-03-16 DIAGNOSIS — Q399 Congenital malformation of esophagus, unspecified: Secondary | ICD-10-CM | POA: Diagnosis not present

## 2017-03-16 DIAGNOSIS — Z7952 Long term (current) use of systemic steroids: Secondary | ICD-10-CM | POA: Insufficient documentation

## 2017-03-16 DIAGNOSIS — K228 Other specified diseases of esophagus: Secondary | ICD-10-CM | POA: Insufficient documentation

## 2017-03-16 DIAGNOSIS — K31819 Angiodysplasia of stomach and duodenum without bleeding: Secondary | ICD-10-CM | POA: Diagnosis not present

## 2017-03-16 DIAGNOSIS — K219 Gastro-esophageal reflux disease without esophagitis: Secondary | ICD-10-CM | POA: Insufficient documentation

## 2017-03-16 DIAGNOSIS — Z7722 Contact with and (suspected) exposure to environmental tobacco smoke (acute) (chronic): Secondary | ICD-10-CM | POA: Insufficient documentation

## 2017-03-16 DIAGNOSIS — I442 Atrioventricular block, complete: Secondary | ICD-10-CM | POA: Diagnosis not present

## 2017-03-16 DIAGNOSIS — M353 Polymyalgia rheumatica: Secondary | ICD-10-CM | POA: Diagnosis not present

## 2017-03-16 DIAGNOSIS — R1310 Dysphagia, unspecified: Secondary | ICD-10-CM | POA: Diagnosis not present

## 2017-03-16 DIAGNOSIS — K222 Esophageal obstruction: Secondary | ICD-10-CM | POA: Insufficient documentation

## 2017-03-16 DIAGNOSIS — T18128A Food in esophagus causing other injury, initial encounter: Secondary | ICD-10-CM | POA: Diagnosis not present

## 2017-03-16 DIAGNOSIS — G4733 Obstructive sleep apnea (adult) (pediatric): Secondary | ICD-10-CM | POA: Diagnosis not present

## 2017-03-16 DIAGNOSIS — Z950 Presence of cardiac pacemaker: Secondary | ICD-10-CM | POA: Insufficient documentation

## 2017-03-16 DIAGNOSIS — I10 Essential (primary) hypertension: Secondary | ICD-10-CM | POA: Insufficient documentation

## 2017-03-16 DIAGNOSIS — Z9841 Cataract extraction status, right eye: Secondary | ICD-10-CM | POA: Insufficient documentation

## 2017-03-16 DIAGNOSIS — I4510 Unspecified right bundle-branch block: Secondary | ICD-10-CM | POA: Insufficient documentation

## 2017-03-16 DIAGNOSIS — Z79899 Other long term (current) drug therapy: Secondary | ICD-10-CM | POA: Diagnosis not present

## 2017-03-16 DIAGNOSIS — X58XXXA Exposure to other specified factors, initial encounter: Secondary | ICD-10-CM | POA: Insufficient documentation

## 2017-03-16 DIAGNOSIS — K210 Gastro-esophageal reflux disease with esophagitis: Secondary | ICD-10-CM | POA: Diagnosis not present

## 2017-03-16 DIAGNOSIS — R1314 Dysphagia, pharyngoesophageal phase: Secondary | ICD-10-CM | POA: Diagnosis not present

## 2017-03-16 HISTORY — PX: BALLOON DILATION: SHX5330

## 2017-03-16 HISTORY — PX: ESOPHAGOGASTRODUODENOSCOPY: SHX5428

## 2017-03-16 HISTORY — DX: Personal history of other diseases of the digestive system: Z87.19

## 2017-03-16 SURGERY — EGD (ESOPHAGOGASTRODUODENOSCOPY)
Anesthesia: General

## 2017-03-16 MED ORDER — SODIUM CHLORIDE 0.9 % IJ SOLN
100.0000 [IU] | Freq: Once | INTRAMUSCULAR | Status: DC
  Filled 2017-03-16: qty 100

## 2017-03-16 MED ORDER — PHENYLEPHRINE HCL 10 MG/ML IJ SOLN
INTRAMUSCULAR | Status: DC | PRN
  Administered 2017-03-16 (×2): 80 ug via INTRAVENOUS

## 2017-03-16 MED ORDER — SODIUM CHLORIDE 0.9 % IV SOLN: INTRAVENOUS | Status: DC

## 2017-03-16 MED ORDER — SODIUM CHLORIDE 0.9 % IV SOLN
INTRAVENOUS | Status: AC | PRN
  Administered 2017-03-16: 1000 mL via INTRAVENOUS

## 2017-03-16 MED ORDER — ONDANSETRON HCL 4 MG/2ML IJ SOLN
INTRAMUSCULAR | Status: DC | PRN
  Administered 2017-03-16: 4 mg via INTRAVENOUS

## 2017-03-16 MED ORDER — PROPOFOL 10 MG/ML IV BOLUS
INTRAVENOUS | Status: DC | PRN
  Administered 2017-03-16: 150 mg via INTRAVENOUS

## 2017-03-16 MED ORDER — IOPAMIDOL (ISOVUE-300) INJECTION 61%
INTRAVENOUS | Status: AC
  Filled 2017-03-16: qty 150

## 2017-03-16 MED ORDER — LIDOCAINE HCL (CARDIAC) 20 MG/ML IV SOLN
INTRAVENOUS | Status: DC | PRN
  Administered 2017-03-16: 100 mg via INTRATRACHEAL

## 2017-03-16 MED ORDER — IOPAMIDOL (ISOVUE-300) INJECTION 61%
150.0000 mL | Freq: Once | INTRAVENOUS | Status: AC | PRN
  Administered 2017-03-16: 100 mL via ORAL

## 2017-03-16 MED ORDER — SUGAMMADEX SODIUM 200 MG/2ML IV SOLN
INTRAVENOUS | Status: DC | PRN
  Administered 2017-03-16: 200 mg via INTRAVENOUS

## 2017-03-16 MED ORDER — FENTANYL CITRATE (PF) 250 MCG/5ML IJ SOLN
INTRAMUSCULAR | Status: DC | PRN
  Administered 2017-03-16: 100 ug via INTRAVENOUS

## 2017-03-16 MED ORDER — ROCURONIUM BROMIDE 100 MG/10ML IV SOLN
INTRAVENOUS | Status: DC | PRN
  Administered 2017-03-16: 40 mg via INTRAVENOUS

## 2017-03-16 MED ORDER — IOPAMIDOL (ISOVUE-300) INJECTION 61%
INTRAVENOUS | Status: AC
  Filled 2017-03-16: qty 50

## 2017-03-16 NOTE — Anesthesia Procedure Notes (Signed)
Procedure Name: Intubation Date/Time: 03/16/2017 10:17 AM Performed by: Mariea Clonts Pre-anesthesia Checklist: Patient identified, Emergency Drugs available, Suction available and Patient being monitored Patient Re-evaluated:Patient Re-evaluated prior to induction Oxygen Delivery Method: Circle System Utilized Preoxygenation: Pre-oxygenation with 100% oxygen Induction Type: IV induction Ventilation: Mask ventilation without difficulty Laryngoscope Size: Miller and 2 Grade View: Grade II Tube type: Oral Tube size: 7.0 mm Number of attempts: 1 Airway Equipment and Method: Stylet and Oral airway Placement Confirmation: ETT inserted through vocal cords under direct vision,  positive ETCO2 and breath sounds checked- equal and bilateral Tube secured with: Tape Dental Injury: Teeth and Oropharynx as per pre-operative assessment

## 2017-03-16 NOTE — Transfer of Care (Signed)
Immediate Anesthesia Transfer of Care Note  Patient: [name]  Procedure(s) Performed: Procedure(s) with comments: ESOPHAGOGASTRODUODENOSCOPY (EGD) (N/A) BALLOON DILATION (N/A) - PNUEMATIC BALLOONS  Patient Location: Endoscopy Unit  Anesthesia Type:General  Level of Consciousness: awake, alert  and oriented  Airway & Oxygen Therapy: Patient Spontanous Breathing and Patient connected to nasal cannula oxygen  Post-op Assessment: Report given to RN, Post -op Vital signs reviewed and stable and Patient moving all extremities X 4  Post vital signs: Reviewed and stable  Last Vitals:  Vitals:   03/16/17 0907 03/16/17 1217  BP: 137/71 (!) 158/41  Pulse:  72  Resp: 18 14  Temp:  36.6 C  SpO2: 98% 99%    Last Pain:  Vitals:   03/16/17 1217  TempSrc: Oral         Complications: No apparent anesthesia complications

## 2017-03-16 NOTE — H&P (View-Only) (Signed)
[name]    [id number]    [birth year]  Primary Care Physician:Pandey, Karalee Height, MD  Referring Physician: Estill Dooms, [address] [place], [place] [address]  Chief complaint:  Dysphagia  HPI:  83 year old female with history of achalasia status post Botox injection March 2017 , pneumatic balloon dilation with 30 mm Rigiflex balloon in May 2017 here for follow-up visit with complaints of worsening dysphagia. She had significant improvement in her symptoms after pneumatic balloon dilation, was last seen in office in July 2017. She feels over the past 1-2 months she has having more difficulty swallowing intermittently and also is regurgitating food back. She has gained weight ~ 25 lbs in the past 1 year. Denies any change in bowel habits, melena or blood per rectum.    Outpatient Encounter Prescriptions as of 02/22/2017  Medication Sig  . acetaminophen (TYLENOL) 325 MG tablet Take 650 mg by mouth every 6 (six) hours as needed for moderate pain (arthritis pain). Reported on 08/18/2015  . ascorbic acid (VITAMIN C) 500 MG tablet Take 500 mg by mouth daily. Reported on 08/26/2015  . b complex vitamins capsule Take 1 capsule by mouth daily.  . Calcium Carbonate (CALCIUM 600 PO) Take 600 mg by mouth daily.  . Cholecalciferol (VITAMIN D-3) 1000 units CAPS Take 1,000 Units by mouth daily.   . furosemide (LASIX) 20 MG tablet Take one tablet (20 mg) by mouth once daily AS NEEDED for swelling  . Multiple Vitamin (MULTIVITAMIN) tablet Take 1 tablet by mouth daily.  . Omega 3 1200 MG CAPS Take 1,200 mg by mouth at bedtime.   . Probiotic Product (PROBIOTIC DAILY PO) Take 1 capsule by mouth daily.   . ranitidine (ZANTAC) 150 MG tablet Take 150 mg by mouth 2 (two) times daily. Take one tablet in morning and one in evening   No facility-administered encounter medications on file as of 02/22/2017.     Allergies as of 02/22/2017 - Review Complete 02/22/2017  Allergen Reaction  Noted  . Actonel [risedronate sodium] Other (See Comments) 06/19/2012  . Ivp dye [iodinated diagnostic agents] Nausea And Vomiting 06/19/2012    Past Medical History:  Diagnosis Date  . Achalasia   . Anemia   . Arthritis    back  . Chronic steroid use   . Complete heart block ([place])    S/P PACEMAKER 2000 W/ GENERATOR CHANGE 2009  . Complete heart block ([place])   . Empyema, right ([place]) PULMOLOGIST-- DR CLANCE   VATS 06/23/2012 cultures negative to date CXR 07/19/12 persistent airfluid levels/  CXR 11-01-2012 IMPROVE RIGHT PLEURAL EFFUSION  . GERD (gastroesophageal reflux disease)   . H/O echocardiogram    NORMAL PER PT DONE AT [facility]  . History of aspiration pneumonitis    DEC 2013  . Hyperglycemia 07/17/2013  . Hypertension   . Inguinal hernia    right  . Intrinsic urethral sphincter deficiency   . Megaesophagus   . Mixed stress and urge urinary incontinence   . Multinodular thyroid 06/26/2012   Multi nodular goiter. Large nodules in both lobes of the gland.  These nodules fit national criteria for fine needle aspiration  biopsy if not previously assessed.    . Normal cardiac stress test    PER PT AT [facility]  . OSA on CPAP    cpap, doees not know settings  . Pacemaker [employer]IC--  LAST PACER CHECK 05-09-2013  PER PT CHECK OUT OK  CARIOLOGIST--  DR Rod Holler GREENFIELD AT [facility]--  PT HAS HAD NORMAL STRESS TEST AND ECHO   . Polymyalgia rheumatica ([facility])    on chronic Prednisone 5mg  daily  . RBBB   . S/P dilatation of esophageal stricture     Past Surgical History:  Procedure Laterality Date  . APPENDECTOMY  1953   w/ removal benign kidney tumor   . BALLOON DILATION  07/24/2012   Procedure: BALLOON DILATION;  Surgeon: Inda Castle, MD;  Location: [facility];  Service: Endoscopy;  Laterality: N/A;  . BALLOON DILATION N/A 12/03/2015   Procedure: BALLOON DILATION;  Surgeon: Mauri Pole, MD;  Location: [facility];  Service: Endoscopy;  Laterality: N/A;  pnuematic balloon  .  BOTOX INJECTION  08/07/2012   Procedure: BOTOX INJECTION;  Surgeon: Inda Castle, MD;  Location: [facility];  Service: Endoscopy;  Laterality: N/A;  . BOTOX INJECTION N/A 05/24/2013   Procedure: MACROPLASTIQUE IMPLANT;  Surgeon: Irine Seal, MD;  Location: [facility];  Service: Urology;  Laterality: N/A;  . BOTOX INJECTION  02/25/2014   Procedure: BOTOX INJECTION;  Surgeon: Inda Castle, MD;  Location: [facility];  Service: Endoscopy;;  . CARDIAC PACEMAKER PLACEMENT  06/1999  DR RUTH GREENFIELD AT [facility]  ( LAST PACER CHECK 05-09-2013) for CHB/   END-OF-LIFE GENERATOR CHANGE  2009  . CATARACT EXTRACTION W/ INTRAOCULAR LENS  IMPLANT, BILATERAL  2005  . CATARACT EXTRACTION W/ INTRAOCULAR LENS  IMPLANT, BILATERAL    . DILATION AND CURETTAGE OF UTERUS    . ESOPHAGOGASTRODUODENOSCOPY  07/24/2012   Procedure: ESOPHAGOGASTRODUODENOSCOPY (EGD);  Surgeon: Inda Castle, MD;  Location: [facility];  Service: Endoscopy;  Laterality: N/A;  . ESOPHAGOGASTRODUODENOSCOPY  08/07/2012   Procedure: ESOPHAGOGASTRODUODENOSCOPY (EGD);  Surgeon: Inda Castle, MD;  Location: [facility];  Service: Endoscopy;  Laterality: N/A;  . ESOPHAGOGASTRODUODENOSCOPY N/A 02/25/2014   Procedure: ESOPHAGOGASTRODUODENOSCOPY (EGD);  Surgeon: Inda Castle, MD;  Location: [facility];  Service: Endoscopy;  Laterality: N/A;  . ESOPHAGOGASTRODUODENOSCOPY (EGD) WITH ESOPHAGEAL DILATION  06/27/2012   Procedure: ESOPHAGOGASTRODUODENOSCOPY (EGD) WITH ESOPHAGEAL DILATION;  Surgeon: Ladene Artist, MD,FACG;  Location: [facility];  Service: Endoscopy;  Laterality: N/A;  . ESOPHAGOGASTRODUODENOSCOPY (EGD) WITH PROPOFOL N/A 10/09/2015   Procedure: ESOPHAGOGASTRODUODENOSCOPY (EGD) WITH PROPOFOL ( WITH BOTOX);  Surgeon: Milus Banister, MD;  Location: [facility];  Service: Endoscopy;  Laterality: N/A;  . ESOPHAGOGASTRODUODENOSCOPY (EGD) WITH PROPOFOL N/A 12/03/2015   Procedure:  ESOPHAGOGASTRODUODENOSCOPY (EGD) WITH PROPOFOL;  Surgeon: Mauri Pole, MD;  Location: [facility];  Service: Endoscopy;  Laterality: N/A;  . JOINT REPLACEMENT     right hip, 2001  . PACEMAKER GENERATOR CHANGE  12/13/2007   at [facility]  . TOE SURGERY  2013   left 3rd toe HAMMERTOE REPAIR  . TONSILLECTOMY  AS CHILD  . TOTAL HIP ARTHROPLASTY Right 2001  . VERICOSE VEIN LIGATION    . VIDEO ASSISTED THORACOSCOPY (VATS)/DECORTICATION  06/23/2012   Procedure: VIDEO ASSISTED THORACOSCOPY (VATS)/DECORTICATION;  Surgeon: Grace Isaac, MD;  Location: [facility];  Service: Thoracic;  Laterality: Right;  Marland Kitchen VIDEO ASSISTED THORACOSCOPY (VATS)/EMPYEMA     06/23/2012  . VIDEO BRONCHOSCOPY  06/23/2012   Procedure: VIDEO BRONCHOSCOPY;  Surgeon: Grace Isaac, MD;  Location: [facility];  Service: Thoracic;  Laterality: N/A;    Family History  Problem Relation Age of Onset  . Arthritis Father   . Heart disease Father   . Cancer Brother        ?  spine  . Heart disease Brother   . Lung cancer Brother   . Diabetes Son   . Heart disease Mother        Congestive heart failure  . Heart disease Brother   . Arthritis Brother   . Hypertension Brother   . Colon cancer Neg Hx     Social History   Social History  . Marital status: Married    Spouse name: N/A  . Number of children: 42  . Years of education: N/A   Occupational History  . Retired Surveyor, [employer]    Social History Main Topics  . Smoking status: Passive Smoke Exposure - Never Smoker  . Smokeless tobacco: Never Used     Comment: Exposure through father only.  . Alcohol use 1.8 oz/week    3 Glasses of wine per week     Comment: 1 drink x 3 days/week  . Drug use: No  . Sexual activity: No     Comment: married   Other Topics Concern  . Not on file   Social History Narrative   Lives at [facility] since 05/18/2012   Married   Pacemaker   POA   Never smoked   Alcohol-wine 2-3 nights a week    Exercise - exercise  classes 3 days a week    Whole Body Donation at [facility]      [facility]:   Originally from [place]. Has lived in [place], [place], [place], & moved to [place] in 1961. 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 No bird exposure.       Review of systems: Review of Systems  Constitutional: Negative for fever and chills.  HENT: Negative.   Eyes: Negative for blurred vision.  Respiratory: Negative for cough, shortness of breath and wheezing.   Cardiovascular: Negative for chest pain and palpitations.  Gastrointestinal: as per HPI Genitourinary: Negative for dysuria, urgency, frequency and hematuria.  Musculoskeletal: Positive for myalgias, back pain and joint pain.  Skin: Negative for itching and rash.  Neurological: Negative for dizziness, tremors, focal weakness, seizures and loss of consciousness.  Endo/Heme/Allergies: Positive for seasonal allergies.  Psychiatric/Behavioral: Negative for depression, suicidal ideas and hallucinations.  All other systems reviewed and are negative.   Physical Exam: Vitals:   02/22/17 0849  BP: 130/76  Pulse: 72   Body mass index is 28.25 kg/m. Gen:      No acute distress HEENT:  EOMI, sclera anicteric Neck:     No masses; no thyromegaly Lungs:    Clear to auscultation bilaterally; normal respiratory effort CV:         Regular rate and rhythm; no murmurs Abd:      + bowel sounds; soft, non-tender; no palpable masses, no distension Ext:   1+ edema; adequate peripheral perfusion Skin:      Warm and dry; no rash Neuro: alert and oriented x 3 Psych: normal mood and affect  Data Reviewed:   Reviewed labs, radiology imaging, old records and pertinent past GI work up   Assessment and Plan/Recommendations:  83 year old female with type II achalasia status post Botox injection and pneumatic balloon dilation here with complaints of worsening dysphagia with regurgitation which had improved after  pneumatic dilation a year ago We will schedule barium esophagram to evaluate flow of barium through LES Schedule for repeat EGD with possible pneumatic dilation at [facility], patient will need to be intubated for the procedure under general anesthesia given significant esophageal  dilation with retained secretions due to achalasia The risks and benefits as well as alternatives of endoscopic procedure(s) have been discussed and reviewed. All questions answered. The patient agrees to proceed.   Damaris Hippo , MD [phone] Mon-Fri 8a-5p [phone] after 5p, weekends, holidays  CC: Estill Dooms, MD

## 2017-03-16 NOTE — Telephone Encounter (Signed)
There is an opening 04/13/17 if I use a "banding" appointment. Okay to do this?

## 2017-03-16 NOTE — Interval H&P Note (Signed)
History and Physical Interval Note:  03/16/2017 9:49 AM  [name]  has presented today for surgery, with the diagnosis of Dysphagia / Achalasia   The various methods of treatment have been discussed with the patient and family. After consideration of risks, benefits and other options for treatment, the patient has consented to  Procedure(s) with comments: ESOPHAGOGASTRODUODENOSCOPY (EGD) (N/A) BOTOX INJECTION (N/A) BALLOON DILATION (N/A) - PNUEMATIC BALLOONS under fluoroscopy and general anaesthesia as a surgical intervention .  The patient's history has been reviewed, patient examined, no change in status, stable for surgery.  I have reviewed the patient's chart and labs.  Questions were answered to the patient's satisfaction.     Kavitha Nandigam

## 2017-03-16 NOTE — Op Note (Signed)
[facility] Patient Name: [name] Procedure Date : 03/16/2017 MRN: [id number] Attending MD: Mauri Pole , MD Date of Birth: 06/02/1929 CSN: [id number] Age: 83 Admit Type: Outpatient Procedure:                Upper GI endoscopy Indications:              Dysphagia, For management of achalasia, For balloon                            dilation of achalasia Providers:                Mauri Pole, MD, Carolynn Comment RN, RN,                            Elspeth Cho Tech., Technician, Virgilio Belling.                            Beckner, CRNA Referring MD:              Medicines:                Monitored Anesthesia Care Complications:            No immediate complications. Estimated Blood Loss:     Estimated blood loss was minimal. Procedure:                Pre-Anesthesia Assessment:                           - Prior to the procedure, a History and Physical                            was performed, and patient medications and                            allergies were reviewed. The patient's tolerance of                            previous anesthesia was also reviewed. The risks                            and benefits of the procedure and the sedation                            options and risks were discussed with the patient.                            All questions were answered, and informed consent                            was obtained. Prior Anticoagulants: The patient has                            taken no previous anticoagulant or antiplatelet  agents. ASA Grade Assessment: III - A patient with                            severe systemic disease. After reviewing the risks                            and benefits, the patient was deemed in                            satisfactory condition to undergo the procedure.                           After obtaining informed consent, the endoscope was                            passed  under direct vision. Throughout the                            procedure, the patient's blood pressure, pulse, and                            oxygen saturations were monitored continuously. The                            [id number]) scope was introduced through the                            mouth, and advanced to the second part of duodenum.                            The upper GI endoscopy was technically difficult                            and complex due to presence of bezoar. The patient                            tolerated the procedure well. Scope In: Scope Out: Findings:      The examined esophagus was significantly tortuous and severely dilated.       Large diverticula in the lower esophagus.      Large amount of food bezoar was found in the entire esophagus. Removal       of food was accomplished with roth net and lavage. Small linear ulcer       noted in the proximal ulcer, clean base superficial.      Tight EG junction at 40cm from the incisors, was traversed with pressure       from scope. A guide wire was placed, then the scope was withdrawn. Using       the wire as a guide, initial dilation with 47mm followed by dilation       with a 35 mm pneumatic balloon was performed under fluoroscopic       guidance. The balloon was inflated to 10 PSI with complete effacement of       the balloon waist. Pressure was held for 60 seconds before balloon  deflation and withdrawal. There was a small amount of blood on the 43mm       balloon.      Scattered moderate inflammation characterized by congestion (edema),       erythema and friability was found in the entire examined stomach.      The examined duodenum was normal. Impression:               - Tortuous dilated esophagus filled with food                            bezoar and secretions.                           - Food in the esophagus. Removal was successful.                           - Esophageal achalasia. Dilated  with 23mm Rigiflex                            balloon                           - Gastritis.                           - Normal examined duodenum. Moderate Sedation:      N/A Recommendation:           - Patient has a contact number available for                            emergencies. The signs and symptoms of potential                            delayed complications were discussed with the                            patient. Return to normal activities tomorrow.                            Written discharge instructions were provided to the                            patient.                           - Follow up esophagogram with water soluble                            contrast to evaluate for any leaks or large tears                            once patient is awake and able to follow                            instrctions post procedure                           -  Full liquid diet for 3 days, then advance as                            tolerated to soft diet for 1 week.                           - Continue present medications.                           - No ibuprofen, naproxen, or other non-steroidal                            anti-inflammatory drugs for 4 weeks.                           - Return to GI office in 2 months. Procedure Code(s):        --- Professional ---                           [id number], 71, Esophagogastroduodenoscopy, flexible,                            transoral; with dilation of esophagus with balloon                            (30 mm diameter or larger) (includes fluoroscopic                            guidance, when performed)                           43247, 22, Esophagogastroduodenoscopy, flexible,                            transoral; with removal of foreign body(s) Diagnosis Code(s):        --- Professional ---                           Q39.9, Congenital malformation of esophagus,                            unspecified                           T18.128A,  Food in esophagus causing other injury,                            initial encounter                           K22.2, Esophageal obstruction                           K29.70, Gastritis, unspecified, without bleeding                           R13.10, Dysphagia, unspecified  K22.0, Achalasia of cardia CPT copyright 2016 American Medical Association. All rights reserved. The codes documented in this report are preliminary and upon coder review may  be revised to meet current compliance requirements. Mauri Pole, MD 03/16/2017 12:19:23 PM This report has been signed electronically. Number of Addenda: 0

## 2017-03-16 NOTE — Discharge Instructions (Signed)
General Anesthesia, Adult, Care After °These instructions provide you with information about caring for yourself after your procedure. Your health care provider may also give you more specific instructions. Your treatment has been planned according to current medical practices, but problems sometimes occur. Call your health care provider if you have any problems or questions after your procedure. °What can I expect after the procedure? °After the procedure, it is common to have: °· Vomiting. °· A sore throat. °· Mental slowness. ° °It is common to feel: °· Nauseous. °· Cold or shivery. °· Sleepy. °· Tired. °· Sore or achy, even in parts of your body where you did not have surgery. ° °Follow these instructions at home: °For at least 24 hours after the procedure: °· Do not: °? Participate in activities where you could fall or become injured. °? Drive. °? Use heavy machinery. °? Drink alcohol. °? Take sleeping pills or medicines that cause drowsiness. °? Make important decisions or sign legal documents. °? Take care of children on your own. °· Rest. °Eating and drinking °· If you vomit, drink water, juice, or soup when you can drink without vomiting. °· Drink enough fluid to keep your urine clear or pale yellow. °· Make sure you have little or no nausea before eating solid foods. °· Follow the diet recommended by your health care provider. °General instructions °· Have a responsible adult stay with you until you are awake and alert. °· Return to your normal activities as told by your health care provider. Ask your health care provider what activities are safe for you. °· Take over-the-counter and prescription medicines only as told by your health care provider. °· If you smoke, do not smoke without supervision. °· Keep all follow-up visits as told by your health care provider. This is important. °Contact a health care provider if: °· You continue to have nausea or vomiting at home, and medicines are not helpful. °· You  cannot drink fluids or start eating again. °· You cannot urinate after 8-12 hours. °· You develop a skin rash. °· You have fever. °· You have increasing redness at the site of your procedure. °Get help right away if: °· You have difficulty breathing. °· You have chest pain. °· You have unexpected bleeding. °· You feel that you are having a life-threatening or urgent problem. °This information is not intended to replace advice given to you by your health care provider. Make sure you discuss any questions you have with your health care provider. °Document Released: 10/04/2000 Document Revised: 12/01/2015 Document Reviewed: 06/12/2015 °Elsevier Interactive Patient Education © 2018 Elsevier Inc. ° °

## 2017-03-16 NOTE — Anesthesia Postprocedure Evaluation (Signed)
Anesthesia Post Note  Patient: TERRILYNN POSTELL  Procedure(s) Performed: Procedure(s) (LRB): ESOPHAGOGASTRODUODENOSCOPY (EGD) (N/A) BALLOON DILATION (N/A)     Patient location during evaluation: PACU Anesthesia Type: General Level of consciousness: awake and alert Pain management: pain level controlled Vital Signs Assessment: post-procedure vital signs reviewed and stable Respiratory status: spontaneous breathing, nonlabored ventilation, respiratory function stable and patient connected to nasal cannula oxygen Cardiovascular status: blood pressure returned to baseline and stable Postop Assessment: no signs of nausea or vomiting Anesthetic complications: no    Last Vitals:  Vitals:   03/16/17 1330 03/16/17 1340  BP: (!) 141/46 (!) 141/53  Pulse: 78 70  Resp: 16 19  Temp:    SpO2: 93% 93%    Last Pain:  Vitals:   03/16/17 1217  TempSrc: Oral                 Surafel Hilleary DAVID

## 2017-03-16 NOTE — Telephone Encounter (Signed)
-----   Message from Mauri Pole, MD sent at 03/16/2017  3:12 PM EDT ----- Informed patient and family. Beth, can you please schedule a post procedure follow up appt in next few weeks with me. Thanks

## 2017-03-16 NOTE — Anesthesia Preprocedure Evaluation (Signed)
Anesthesia Evaluation  Patient identified by MRN, date of birth, ID band Patient awake    Reviewed: Allergy & Precautions, NPO status , Patient's Chart, lab work & pertinent test results  Airway Mallampati: I  TM Distance: >3 FB Neck ROM: Full    Dental   Pulmonary sleep apnea ,    Pulmonary exam normal        Cardiovascular hypertension, Pt. on medications Normal cardiovascular exam+ pacemaker      Neuro/Psych    GI/Hepatic GERD  Controlled,  Endo/Other    Renal/GU      Musculoskeletal   Abdominal   Peds  Hematology   Anesthesia Other Findings   Reproductive/Obstetrics                             Anesthesia Physical Anesthesia Plan  ASA: III  Anesthesia Plan: General   Post-op Pain Management:    Induction:   PONV Risk Score and Plan: 3 and Ondansetron, Dexamethasone, Midazolam and Treatment may vary due to age or medical condition  Airway Management Planned: Oral ETT  Additional Equipment:   Intra-op Plan:   Post-operative Plan: Extubation in OR  Informed Consent: I have reviewed the patients History and Physical, chart, labs and discussed the procedure including the risks, benefits and alternatives for the proposed anesthesia with the patient or authorized representative who has indicated his/her understanding and acceptance.     Plan Discussed with: CRNA and Surgeon  Anesthesia Plan Comments:         Anesthesia Quick Evaluation

## 2017-03-17 ENCOUNTER — Ambulatory Visit (HOSPITAL_COMMUNITY): Payer: Medicare Other

## 2017-03-17 NOTE — Telephone Encounter (Signed)
Ok, thanks.

## 2017-03-17 NOTE — Telephone Encounter (Signed)
Glad she is doing better. Thanks

## 2017-03-17 NOTE — Telephone Encounter (Signed)
Spoke with the patient about an appointment. She says she is doing well and wants to thank Dr Silverio Decamp for her care.  Appointment scheduled for 04/13/17 at 3:30 pm.

## 2017-04-13 ENCOUNTER — Encounter: Payer: Self-pay | Admitting: Gastroenterology

## 2017-04-13 ENCOUNTER — Ambulatory Visit (INDEPENDENT_AMBULATORY_CARE_PROVIDER_SITE_OTHER): Payer: Medicare Other | Admitting: Gastroenterology

## 2017-04-13 VITALS — BP 110/60 | HR 76 | Ht 61.0 in | Wt 149.0 lb

## 2017-04-13 DIAGNOSIS — K225 Diverticulum of esophagus, acquired: Secondary | ICD-10-CM

## 2017-04-13 DIAGNOSIS — R1310 Dysphagia, unspecified: Secondary | ICD-10-CM

## 2017-04-13 DIAGNOSIS — K220 Achalasia of cardia: Secondary | ICD-10-CM | POA: Diagnosis not present

## 2017-04-13 DIAGNOSIS — R1319 Other dysphagia: Secondary | ICD-10-CM

## 2017-04-13 DIAGNOSIS — Q396 Congenital diverticulum of esophagus: Secondary | ICD-10-CM

## 2017-04-13 NOTE — Progress Notes (Signed)
NORRINE BALLESTER    [id number]    16-Feb-1929  Primary Care Physician:Pandey, Karalee Height, MD  Referring Physician: Blanchie Serve, MD [address] [place], [place] [address]  Chief complaint: Achalasia HPI: 83 year old female with achalasia status post dilation with 35 mm pneumatic balloon here for follow-up visit. Patient reports improvement of swallowing status posttraumatic balloon dilation but she continues to choke during her meals intermittently. Patient is currently on regular diet and she noticed some difficulty while eating salad yesterday. She hasn't noticed having any difficulty with drinking liquids. Patient says she is burping more after the procedure. Weight is stable. Denies any nausea, vomiting, abdominal pain, melena or bright red blood per rectum    Outpatient Encounter Prescriptions as of 04/13/2017  Medication Sig  . acetaminophen (TYLENOL) 325 MG tablet Take 650 mg by mouth every 6 (six) hours as needed for moderate pain (arthritis pain). Reported on 08/18/2015  . ascorbic acid (VITAMIN C) 500 MG tablet Take 500 mg by mouth daily. Reported on 08/26/2015  . b complex vitamins capsule Take 1 capsule by mouth daily.  . Calcium Carbonate (CALCIUM 600 PO) Take 600 mg by mouth daily.  . Cholecalciferol (VITAMIN D-3) 1000 units CAPS Take 1,000 Units by mouth daily.   Marland Kitchen co-enzyme Q-10 30 MG capsule Take 30 mg by mouth daily.  . furosemide (LASIX) 20 MG tablet Take one tablet (20 mg) by mouth once daily AS NEEDED for swelling  . Multiple Vitamin (MULTIVITAMIN) tablet Take 1 tablet by mouth daily.  . Omega 3 1200 MG CAPS Take 1,200 mg by mouth at bedtime.   Marland Kitchen PREMARIN vaginal cream Apply topically as directed.  . Probiotic Product (PROBIOTIC DAILY PO) Take 1 capsule by mouth daily.   . ranitidine (ZANTAC) 150 MG tablet Take 150 mg by mouth 2 (two) times daily. Take one tablet in morning and one in evening   No facility-administered encounter medications on file as of  04/13/2017.     Allergies as of 04/13/2017 - Review Complete 04/13/2017  Allergen Reaction Noted  . Actonel [risedronate sodium] Other (See Comments) 06/19/2012  . Ivp dye [iodinated diagnostic agents] Nausea And Vomiting 06/19/2012    Past Medical History:  Diagnosis Date  . Achalasia   . Anemia   . Arthritis    back  . Chronic steroid use   . Complete heart block ([place])    S/P PACEMAKER 2000 W/ GENERATOR CHANGE 2009  . Complete heart block ([place])   . Empyema, right ([place]) PULMOLOGIST-- DR CLANCE   VATS 06/23/2012 cultures negative to date CXR 07/19/12 persistent airfluid levels/  CXR 11-01-2012 IMPROVE RIGHT PLEURAL EFFUSION  . GERD (gastroesophageal reflux disease)   . H/O echocardiogram    NORMAL PER PT DONE AT [facility]  . History of aspiration pneumonitis    DEC 2013  . History of hiatal hernia   . Hyperglycemia 07/17/2013  . Hypertension   . Inguinal hernia    right  . Intrinsic urethral sphincter deficiency   . Megaesophagus   . Mixed stress and urge urinary incontinence   . Multinodular thyroid 06/26/2012   Multi nodular goiter. Large nodules in both lobes of the gland.  These nodules fit national criteria for fine needle aspiration  biopsy if not previously assessed.    . Normal cardiac stress test    PER PT AT [facility]  . OSA on CPAP    cpap, doees not know settings  . Pacemaker MEDTRONIC--  LAST PACER CHECK 05-09-2013  PER PT CHECK OUT OK   CARIOLOGIST--  DR RUTH GREENFIELD AT [facility]--  PT HAS HAD NORMAL STRESS TEST AND ECHO   . Polymyalgia rheumatica ([facility])    on chronic Prednisone 5mg  daily  . RBBB   . S/P dilatation of esophageal stricture     Past Surgical History:  Procedure Laterality Date  . APPENDECTOMY  1953   w/ removal benign kidney tumor   . BALLOON DILATION  07/24/2012   Procedure: BALLOON DILATION;  Surgeon: Inda Castle, MD;  Location: [facility];  Service: Endoscopy;  Laterality: N/A;  . BALLOON DILATION N/A 12/03/2015   Procedure: BALLOON DILATION;   Surgeon: Mauri Pole, MD;  Location: [facility];  Service: Endoscopy;  Laterality: N/A;  pnuematic balloon  . BALLOON DILATION N/A 03/16/2017   Procedure: BALLOON DILATION;  Surgeon: Mauri Pole, MD;  Location: [facility];  Service: Endoscopy;  Laterality: N/A;  PNUEMATIC BALLOONS  . BOTOX INJECTION  08/07/2012   Procedure: BOTOX INJECTION;  Surgeon: Inda Castle, MD;  Location: [facility];  Service: Endoscopy;  Laterality: N/A;  . BOTOX INJECTION N/A 05/24/2013   Procedure: MACROPLASTIQUE IMPLANT;  Surgeon: Irine Seal, MD;  Location: [facility];  Service: Urology;  Laterality: N/A;  . BOTOX INJECTION  02/25/2014   Procedure: BOTOX INJECTION;  Surgeon: Inda Castle, MD;  Location: [facility];  Service: Endoscopy;;  . CARDIAC PACEMAKER PLACEMENT  06/1999  DR RUTH GREENFIELD AT [facility]  ( LAST PACER CHECK 05-09-2013) for CHB/   END-OF-LIFE GENERATOR CHANGE  2009  . CATARACT EXTRACTION W/ INTRAOCULAR LENS  IMPLANT, BILATERAL  2005  . CATARACT EXTRACTION W/ INTRAOCULAR LENS  IMPLANT, BILATERAL    . DILATION AND CURETTAGE OF UTERUS    . ESOPHAGOGASTRODUODENOSCOPY  07/24/2012   Procedure: ESOPHAGOGASTRODUODENOSCOPY (EGD);  Surgeon: Inda Castle, MD;  Location: [facility];  Service: Endoscopy;  Laterality: N/A;  . ESOPHAGOGASTRODUODENOSCOPY  08/07/2012   Procedure: ESOPHAGOGASTRODUODENOSCOPY (EGD);  Surgeon: Inda Castle, MD;  Location: [facility];  Service: Endoscopy;  Laterality: N/A;  . ESOPHAGOGASTRODUODENOSCOPY N/A 02/25/2014   Procedure: ESOPHAGOGASTRODUODENOSCOPY (EGD);  Surgeon: Inda Castle, MD;  Location: [facility];  Service: Endoscopy;  Laterality: N/A;  . ESOPHAGOGASTRODUODENOSCOPY N/A 03/16/2017   Procedure: ESOPHAGOGASTRODUODENOSCOPY (EGD);  Surgeon: Mauri Pole, MD;  Location: [facility] END[facility]COPY;  Service: Endoscopy;  Laterality: N/A;  . ESOPHAGOGASTRODUODENOSCOPY (EGD) WITH ESOPHAGEAL DILATION  06/27/2012   Procedure:  ESOPHAGOGASTRODUODENOSCOPY (EGD) WITH ESOPHAGEAL DILATION;  Surgeon: Ladene Artist, MD,FACG;  Location: [facility];  Service: Endoscopy;  Laterality: N/A;  . ESOPHAGOGASTRODUODENOSCOPY (EGD) WITH PROPOFOL N/A 10/09/2015   Procedure: ESOPHAGOGASTRODUODENOSCOPY (EGD) WITH PROPOFOL ( WITH BOTOX);  Surgeon: Milus Banister, MD;  Location: [facility];  Service: Endoscopy;  Laterality: N/A;  . ESOPHAGOGASTRODUODENOSCOPY (EGD) WITH PROPOFOL N/A 12/03/2015   Procedure: ESOPHAGOGASTRODUODENOSCOPY (EGD) WITH PROPOFOL;  Surgeon: Mauri Pole, MD;  Location: [facility];  Service: Endoscopy;  Laterality: N/A;  . JOINT REPLACEMENT     right hip, 2001  . PACEMAKER GENERATOR CHANGE  12/13/2007   at [facility]  . TOE SURGERY  2013   left 3rd toe HAMMERTOE REPAIR  . TONSILLECTOMY  AS CHILD  . TOTAL HIP ARTHROPLASTY Right 2001  . VERICOSE VEIN LIGATION    . VIDEO ASSISTED THORACOSCOPY (VATS)/DECORTICATION  06/23/2012   Procedure: VIDEO ASSISTED THORACOSCOPY (VATS)/DECORTICATION;  Surgeon: Grace Isaac, MD;  Location: [facility];  Service: Thoracic;  Laterality: Right;  .  VIDEO ASSISTED THORACOSCOPY (VATS)/EMPYEMA     06/23/2012  . VIDEO BRONCHOSCOPY  06/23/2012   Procedure: VIDEO BRONCHOSCOPY;  Surgeon: Grace Isaac, MD;  Location: [facility];  Service: Thoracic;  Laterality: N/A;    Family History  Problem Relation Age of Onset  . Arthritis Father   . Heart disease Father   . Cancer Brother        ?spine  . Heart disease Brother   . Lung cancer Brother   . Diabetes Son   . Heart disease Mother        Congestive heart failure  . Heart disease Brother   . Arthritis Brother   . Hypertension Brother   . Colon cancer Neg Hx     Social History   Social History  . Marital status: Married    Spouse name: N/A  . Number of children: 39  . Years of education: N/A   Occupational History  . Retired Surveyor, [employer]    Social History Main Topics  . Smoking status: Passive Smoke Exposure  - Never Smoker  . Smokeless tobacco: Never Used     Comment: Exposure through father only.  . Alcohol use 1.8 oz/week    3 Glasses of wine per week     Comment: 1 drink x 3 days/week  . Drug use: No  . Sexual activity: No     Comment: married   Other Topics Concern  . Not on file   Social History Narrative   Lives at [facility] since 05/18/2012   Married   Pacemaker   POA   Never smoked   Alcohol-wine 2-3 nights a week    Exercise - exercise classes 3 days a week    [employer] Donation at [facility]      [facility]:   Originally from [place]. Has lived in [place], [place], [place], & moved to [place] in 1961. 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 No bird exposure.       Review of systems: Review of Systems  Constitutional: Negative for fever and chills.  HENT: Negative.   Eyes: Negative for blurred vision.  Respiratory: Negative for cough, shortness of breath and wheezing.   Cardiovascular: Negative for chest pain and palpitations.  Gastrointestinal: as per HPI Genitourinary: Negative for dysuria, urgency, frequency and hematuria.  Musculoskeletal: Negative for myalgias, back pain and joint pain.  Skin: Negative for itching and rash.  Neurological: Negative for dizziness, tremors, focal weakness, seizures and loss of consciousness.  Endo/Heme/Allergies: Positive for seasonal allergies.  Psychiatric/Behavioral: Negative for depression, suicidal ideas and hallucinations.  All other systems reviewed and are negative.   Physical Exam: Vitals:   04/13/17 1542  BP: 110/60  Pulse: 76   Body mass index is 28.15 kg/m. Gen:      No acute distress HEENT:  EOMI, sclera anicteric Neck:     No masses; no thyromegaly Lungs:    Clear to auscultation bilaterally; normal respiratory effort CV:         Regular rate and rhythm; no murmurs Abd:      + bowel sounds; soft, non-tender; no palpable masses, no distension Ext:    No  edema; adequate peripheral perfusion Skin:      Warm and dry; no rash Neuro: alert and oriented x 3 Psych: normal mood and affect  Data Reviewed:  Reviewed labs, radiology imaging, old records and pertinent past GI work up   Assessment and Plan/Recommendations:  83 year old  female with achalasia, significantly dilated tortuous esophagus with large epiphrenic diverticula Status post pneumatic balloon dilation with 35 mm Rigiflex balloon Explained to patient that even though EG junction was dilated with a pneumatic balloon she has a large epiphrenic diverticula and also tortuous esophagus which can hold significant food residue Advised her to take sips of water during her meal and avoid raw vegetables or salads with high fiber content as likely to be held up in the diverticula given lack of peristalsis Follow antireflux measures Return in 6 months   25 minutes was spent face-to-face with the patient. Greater than 50% of the time used for counseling as well as treatment plan and follow-up. She had multiple questions which were answered to her satisfaction  K. Denzil Magnuson , MD [phone] Mon-Fri 8a-5p [phone] after 5p, weekends, holidays  CC: Blanchie Serve, MD

## 2017-04-13 NOTE — Patient Instructions (Signed)
Follow up in 3 months, sooner if needed.

## 2017-04-20 DIAGNOSIS — Z23 Encounter for immunization: Secondary | ICD-10-CM | POA: Diagnosis not present

## 2017-04-25 ENCOUNTER — Ambulatory Visit (INDEPENDENT_AMBULATORY_CARE_PROVIDER_SITE_OTHER): Payer: Medicare Other | Admitting: *Deleted

## 2017-04-25 DIAGNOSIS — I495 Sick sinus syndrome: Secondary | ICD-10-CM

## 2017-04-25 NOTE — Progress Notes (Signed)
Remote pacemaker transmission.   

## 2017-04-27 ENCOUNTER — Non-Acute Institutional Stay: Payer: Medicare Other | Admitting: Internal Medicine

## 2017-04-27 ENCOUNTER — Encounter: Payer: Self-pay | Admitting: Internal Medicine

## 2017-04-27 VITALS — BP 114/62 | HR 84 | Temp 97.5°F | Resp 16 | Ht 61.0 in | Wt 150.2 lb

## 2017-04-27 DIAGNOSIS — Z950 Presence of cardiac pacemaker: Secondary | ICD-10-CM

## 2017-04-27 DIAGNOSIS — K4090 Unilateral inguinal hernia, without obstruction or gangrene, not specified as recurrent: Secondary | ICD-10-CM

## 2017-04-27 DIAGNOSIS — N3946 Mixed incontinence: Secondary | ICD-10-CM | POA: Diagnosis not present

## 2017-04-27 DIAGNOSIS — E2839 Other primary ovarian failure: Secondary | ICD-10-CM

## 2017-04-27 DIAGNOSIS — M2042 Other hammer toe(s) (acquired), left foot: Secondary | ICD-10-CM

## 2017-04-27 DIAGNOSIS — N952 Postmenopausal atrophic vaginitis: Secondary | ICD-10-CM

## 2017-04-27 DIAGNOSIS — M8580 Other specified disorders of bone density and structure, unspecified site: Secondary | ICD-10-CM | POA: Diagnosis not present

## 2017-04-27 DIAGNOSIS — R609 Edema, unspecified: Secondary | ICD-10-CM | POA: Diagnosis not present

## 2017-04-27 DIAGNOSIS — K220 Achalasia of cardia: Secondary | ICD-10-CM | POA: Diagnosis not present

## 2017-04-27 DIAGNOSIS — E785 Hyperlipidemia, unspecified: Secondary | ICD-10-CM

## 2017-04-27 DIAGNOSIS — G4733 Obstructive sleep apnea (adult) (pediatric): Secondary | ICD-10-CM | POA: Diagnosis not present

## 2017-04-27 LAB — CUP PACEART REMOTE DEVICE CHECK
Battery Impedance: 2427 Ohm
Battery Remaining Longevity: 28 mo
Implantable Lead Implant Date: 20001227
Implantable Lead Location: 753860
Implantable Pulse Generator Implant Date: 20090603
Lead Channel Pacing Threshold Amplitude: 0.5 V
Lead Channel Pacing Threshold Pulse Width: 0.4 ms
Lead Channel Setting Pacing Amplitude: 2 V
Lead Channel Setting Sensing Sensitivity: 4 mV
MDC IDC LEAD IMPLANT DT: 20001227
MDC IDC LEAD LOCATION: 753859
MDC IDC MSMT BATTERY VOLTAGE: 2.73 V
MDC IDC MSMT LEADCHNL RA IMPEDANCE VALUE: 453 Ohm
MDC IDC MSMT LEADCHNL RA PACING THRESHOLD AMPLITUDE: 0.5 V
MDC IDC MSMT LEADCHNL RA PACING THRESHOLD PULSEWIDTH: 0.4 ms
MDC IDC MSMT LEADCHNL RV IMPEDANCE VALUE: 619 Ohm
MDC IDC SESS DTM: 20181015141017
MDC IDC SET LEADCHNL RV PACING AMPLITUDE: 2.5 V
MDC IDC SET LEADCHNL RV PACING PULSEWIDTH: 0.4 ms
MDC IDC STAT BRADY AP VP PERCENT: 11 %
MDC IDC STAT BRADY AP VS PERCENT: 16 %
MDC IDC STAT BRADY AS VP PERCENT: 0 %
MDC IDC STAT BRADY AS VS PERCENT: 72 %

## 2017-04-27 NOTE — Progress Notes (Signed)
[facility]  Provider: Blanchie Serve MD   Location:  [place] of Service:  Clinic (12)  PCP: Blanchie Serve, MD Patient Care Team: Blanchie Serve, MD as PCP - General (Internal Medicine) [place], [facility] [name], MD as Consulting Physician (Cardiology) Irine Seal, MD as Attending Physician (Urology) Clance, Armando Reichert, MD as Consulting Physician (Pulmonary Disease) Grace Isaac, MD as Consulting Physician (Cardiothoracic Surgery) Mauri Pole, MD as Consulting Physician (Gastroenterology)  Extended Emergency Contact Information Primary Emergency Contact: Kurihara,Francis Address: [phone], [place] [address] Johnnette [place]itter of [place] Phone: [phone] Relation: Spouse Secondary Emergency Contact: Maclay,Dorothy Address: [address]          [place], [place] [address] [address] [place]itter of [place] Phone: [phone] Mobile Phone: [phone] Relation: Daughter  Goals of Care: Advanced Directive information Advanced Directives 03/16/2017  Does Patient Have a Medical Advance Directive? Yes  Type of Paramedic of [place];Living will  Does patient want to make changes to medical advance directive? -  Copy of [place] in Chart? No - copy requested  Would patient like information on creating a medical advance directive? -  Pre-existing out of facility DNR order (yellow form or pink MOST form) -    Chief Complaint  Patient presents with  . Medical Management of Chronic Issues    4 month follow up. Patient stated that she has a hammer toe on her left foot that's bothering. She also mentioned that she has had her esophagus stetched. She also mentioned that her hernia has recently started bothering her.   . Medication Refill    No refills needed at this time.     HPI: Patient is a 83 y.o. female seen today for routine visit. Had flu vaccine 04/20/17.  Followed by cardiology, GI and urology service.   Leg swelling- chronic, right > left, currently on Lasix 20 mg daily as needed but has not required it recently. Denies worsening of her swelling or leg pain.   Hammer toe- to left 2nd toe with discomfort in front of toe. No drainage or redness.   Achalasia- s/p dilation on 03/16/17. Note from GI reviewed. She has had improvement of swallowing post dilation but has occasional choking mainly with solid food. Currently on ranitidine. Denies any reflux symptom.  Complete heart block- s/p pacemaker 2000 with device generator replacement in 5/09. Device was interrogated in 4/18 and working well.   Right inguinal Hernia- more aware of it lately. Denies noticing increase in size or pain. Mentions gaining weight recently.   Mixed incontinence- followed by urology. S/p macroplastique placed in 2014. Has tried myrbetriq with success.   Atrophic vaginitis- currently on premarin cream twice a week. Denies any itching or redness. Followed by urology.   Osteopenia- On calcium and vitamin d supplement, no dexa scan on review.   Hyperlipidemia- takes fish oil and coenzyme. tolerating well. Tries to eat healthy.      Past Medical History:  Diagnosis Date  . Achalasia   . Anemia   . Arthritis    back  . Chronic steroid use   . Complete heart block ([place])    S/P PACEMAKER 2000 W/ GENERATOR CHANGE 2009  . Complete heart block ([place])   . Empyema, right ([place]) PULMOLOGIST-- DR CLANCE   VATS 06/23/2012 cultures negative to date CXR 07/19/12 persistent airfluid levels/  CXR  11-01-2012 IMPROVE RIGHT PLEURAL EFFUSION  . GERD (gastroesophageal reflux disease)   . H/O echocardiogram    NORMAL PER PT DONE AT [facility]  . History of aspiration pneumonitis    DEC 2013  . History of hiatal hernia   . Hyperglycemia 07/17/2013  . Hypertension   . Inguinal hernia    right  . Intrinsic urethral sphincter deficiency   . Megaesophagus   . Mixed stress and urge urinary  incontinence   . Multinodular thyroid 06/26/2012   Multi nodular goiter. Large nodules in both lobes of the gland.  These nodules fit national criteria for fine needle aspiration  biopsy if not previously assessed.    . Normal cardiac stress test    PER PT AT [facility]  . OSA on CPAP    cpap, doees not know settings  . Pacemaker MED[employer]ONIC--  LAST PACER CHECK 05-09-2013  PER PT CHECK OUT OK   CARIOLOGIST--  DR RUTH GREENFIELD AT [facility]--  PT HAS HAD NORMAL STRESS TEST AND ECHO   . Polymyalgia rheumatica (HCC)    on chronic Prednisone 55m daily  . RBBB   . S/P dilatation of esophageal stricture    Past Surgical History:  Procedure Laterality Date  . APPENDECTOMY  1953   w/ removal benign kidney tumor   . BALLOON DILATION  07/24/2012   Procedure: BALLOON DILATION;  Surgeon: RInda Castle MD;  [facility];  Service: Endoscopy;  Laterality: N/A;  . BALLOON DILATION N/A 12/03/2015   Procedure: BALLOON DILATION;  Surgeon: KMauri Pole MD;  [facility];  Service: Endoscopy;  Laterality: N/A;  pnuematic balloon  . BALLOON DILATION N/A 03/16/2017   Procedure: BALLOON DILATION;  Surgeon: NMauri Pole MD;  [facility];  Service: Endoscopy;  Laterality: N/A;  PNUEMATIC BALLOONS  . BOTOX INJECTION  08/07/2012   Procedure: BOTOX INJECTION;  Surgeon: RInda Castle MD;  [facility];  Service: Endoscopy;  Laterality: N/A;  . BOTOX INJECTION N/A 05/24/2013   Procedure: MACROPLASTIQUE IMPLANT;  Surgeon: JIrine Seal MD;  [facility]  Service: Urology;  Laterality: N/A;  . BOTOX INJECTION  02/25/2014   Procedure: BOTOX INJECTION;  Surgeon: RInda Castle MD;  [facility];  Service: Endoscopy;;  . CARDIAC PACEMAKER PLACEMENT  06/1999  [facility] ( LAST PACER CHECK 05-09-2013) for CHB/   END-OF-LIFE GENERATOR CHANGE  2009  . CATARACT EXTRACTION W/ INTRAOCULAR LENS  IMPLANT, BILATERAL  2005   . CATARACT EXTRACTION W/ INTRAOCULAR LENS  IMPLANT, BILATERAL    . DILATION AND CURETTAGE OF UTERUS    . ESOPHAGOGASTRODUODENOSCOPY  07/24/2012   Procedure: ESOPHAGOGASTRODUODENOSCOPY (EGD);  Surgeon: RInda Castle MD;  [facility];  Service: Endoscopy;  Laterality: N/A;  . ESOPHAGOGASTRODUODENOSCOPY  08/07/2012   Procedure: ESOPHAGOGASTRODUODENOSCOPY (EGD);  Surgeon: RInda Castle MD;  [facility];  Service: Endoscopy;  Laterality: N/A;  . ESOPHAGOGASTRODUODENOSCOPY N/A 02/25/2014   Procedure: ESOPHAGOGASTRODUODENOSCOPY (EGD);  Surgeon: RInda Castle MD;  [facility];  Service: Endoscopy;  Laterality: N/A;  . ESOPHAGOGASTRODUODENOSCOPY N/A 03/16/2017   Procedure: ESOPHAGOGASTRODUODENOSCOPY (EGD);  Surgeon: NMauri Pole MD;  [facility];  Service: Endoscopy;  Laterality: N/A;  . ESOPHAGOGASTRODUODENOSCOPY (EGD) WITH ESOPHAGEAL DILATION  06/27/2012   Procedure: ESOPHAGOGASTRODUODENOSCOPY (EGD) WITH ESOPHAGEAL DILATION;  Surgeon: MLadene Artist MD,FACG;  [facility]  Service: Endoscopy;  Laterality: N/A;  . ESOPHAGOGASTRODUODENOSCOPY (EGD) WITH PROPOFOL N/A 10/09/2015  Procedure: ESOPHAGOGASTRODUODENOSCOPY (EGD) WITH PROPOFOL ( WITH BOTOX);  Surgeon: Milus Banister, MD;  [facility];  Service: Endoscopy;  Laterality: N/A;  . ESOPHAGOGASTRODUODENOSCOPY (EGD) WITH PROPOFOL N/A 12/03/2015   Procedure: ESOPHAGOGASTRODUODENOSCOPY (EGD) WITH PROPOFOL;  Surgeon: Mauri Pole, MD;  Location: [facility];  Service: Endoscopy;  Laterality: N/A;  . JOINT REPLACEMENT     right hip, 2001  . PACEMAKER GENERATOR CHANGE  12/13/2007   at [facility]  . TOE SURGERY  2013   left 3rd toe HAMMERTOE REPAIR  . TONSILLECTOMY  AS CHILD  . TOTAL HIP ARTHROPLASTY Right 2001  . VERICOSE VEIN LIGATION    . VIDEO ASSISTED THORACOSCOPY (VATS)/DECORTICATION  06/23/2012   Procedure: VIDEO ASSISTED THORACOSCOPY (VATS)/DECORTICATION;  Surgeon:  Grace Isaac, MD;  Location: [facility];  Service: Thoracic;  Laterality: Right;  Marland Kitchen VIDEO ASSISTED THORACOSCOPY (VATS)/EMPYEMA     06/23/2012  . VIDEO BRONCHOSCOPY  06/23/2012   Procedure: VIDEO BRONCHOSCOPY;  Surgeon: Grace Isaac, MD;  Location: [facility];  Service: Thoracic;  Laterality: N/A;    reports that she is a non-smoker but has been exposed to tobacco smoke. She has never used smokeless tobacco. She reports that she drinks about 1.8 oz of alcohol per week . She reports that she does not use drugs. Social History   Social History  . Marital status: Married    Spouse name: N/A  . Number of children: 67  . Years of education: N/A   Occupational History  . Retired Surveyor, [employer]    Social History Main Topics  . Smoking status: Passive Smoke Exposure - Never Smoker  . Smokeless tobacco: Never Used     Comment: Exposure through father only.  . Alcohol use 1.8 oz/week    3 Glasses of wine per week     Comment: 1 drink x 3 days/week  . Drug use: No  . Sexual activity: No     Comment: married   Other Topics Concern  . Not on file   Social History Narrative   [facility] since 05/18/2012   Married   Pacemaker   POA   Never smoked   Alcohol-wine 2-3 nights a week    Exercise - exercise classes 3 days a week    Whole [facility] Donation at [facility]      [facility]:   Originally from [place]. Has lived in [place], [place], [place], & moved to [place] in 1961. 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 No bird exposure.     Functional Status Survey:    Family History  Problem Relation Age of Onset  . Arthritis Father   . Heart disease Father   . Cancer Brother        ?spine  . Heart disease Brother   . Lung cancer Brother   . Diabetes Son   . Heart disease Mother        Congestive heart failure  . Heart disease Brother   . Arthritis Brother   . Hypertension Brother   . Colon cancer Neg Hx      Health Maintenance  Topic Date Due  . DEXA SCAN  11/18/1995  . TETANUS/TDAP  11/04/2025  . INFLUENZA VACCINE  Completed  . PNA vac Low Risk Adult  Completed    Allergies  Allergen Reactions  . Actonel [Risedronate Sodium] Other (See Comments)    Joint aches; rechallenged --caused joint aches  . Ivp Dye [Iodinated Diagnostic Agents]  Nausea And Vomiting    Outpatient Encounter Prescriptions as of 04/27/2017  Medication Sig  . acetaminophen (TYLENOL) 325 MG tablet Take 650 mg by mouth as needed for moderate pain (arthritis pain). Reported on 08/18/2015  . ascorbic acid (VITAMIN C) 500 MG tablet Take 500 mg by mouth daily. Reported on 08/26/2015  . b complex vitamins capsule Take 1 capsule by mouth daily.  . Calcium Carbonate (CALCIUM 600 PO) Take 600 mg by mouth daily.  . Cholecalciferol (VITAMIN D-3) 1000 units CAPS Take 1,000 Units by mouth daily.   Marland Kitchen co-enzyme Q-10 30 MG capsule Take 30 mg by mouth daily.  . furosemide (LASIX) 20 MG tablet Take 20 mg by mouth as needed (swelling).  . Multiple Vitamin (MULTIVITAMIN) tablet Take 1 tablet by mouth daily.  . Omega 3 1200 MG CAPS Take 1,200 mg by mouth at bedtime.   Marland Kitchen PREMARIN vaginal cream Apply 1 Applicatorful topically 2 (two) times a week.   . ranitidine (ZANTAC) 150 MG tablet Take 150 mg by mouth 2 (two) times daily. Take one tablet in morning and one in evening  . Probiotic Product (PROBIOTIC DAILY PO) Take 1 capsule by mouth daily.   . [DISCONTINUED] furosemide (LASIX) 20 MG tablet Take one tablet (20 mg) by mouth once daily AS NEEDED for swelling   No facility-administered encounter medications on file as of 04/27/2017.     Review of Systems  Constitutional: Negative for appetite change, chills, diaphoresis, fatigue and fever.  HENT: Positive for trouble swallowing. Negative for congestion, ear discharge, ear pain, hearing loss, nosebleeds, rhinorrhea, sinus pain, sinus pressure and sore throat.   Eyes: Positive for visual  disturbance.       Wears corrective glasses. S/p cataract surgery  Respiratory: Positive for apnea and choking. Negative for cough, shortness of breath and wheezing.        Uses CPAP machine for sleep apnea  Cardiovascular: Positive for leg swelling. Negative for chest pain and palpitations.       Followed by cardiology, has a pacemaker with history of complete heart block.  Gastrointestinal: Negative for abdominal pain, blood in stool, constipation, diarrhea, nausea and vomiting.  Genitourinary: Positive for frequency and urgency. Negative for dysuria and hematuria.       Wakes upto 3 times at night to urinate  Musculoskeletal: Positive for arthralgias. Negative for gait problem.  Skin: Negative for rash and wound.  Neurological: Negative for dizziness, tremors, numbness and headaches.  Psychiatric/Behavioral: Negative for behavioral problems, confusion and sleep disturbance. The patient is not nervous/anxious.     Vitals:   04/27/17 0929  BP: 114/62  Pulse: 84  Resp: 16  Temp: (!) 97.5 F (36.4 C)  TempSrc: Oral  SpO2: 96%  Weight: 150 lb 3.2 oz (68.1 kg)  Height: '5\' 1"'  (1.549 m)   Body mass index is 28.38 kg/m.   Wt Readings from Last 3 Encounters:  04/27/17 150 lb 3.2 oz (68.1 kg)  04/13/17 149 lb (67.6 kg)  03/16/17 147 lb (66.7 kg)   Physical Exam  Constitutional: She is oriented to person, place, and time. She appears well-developed and well-nourished. No distress.  HENT:  Head: Normocephalic and atraumatic.  Right Ear: External ear normal.  Left Ear: External ear normal.  Nose: Nose normal.  Mouth/Throat: Oropharynx is clear and moist. No oropharyngeal exudate.  Eyes: Pupils are equal, round, and reactive to light. Conjunctivae and EOM are normal. Right eye exhibits no discharge. Left eye exhibits no discharge. No scleral icterus.  Wears corrective glasses  Neck: Normal range of motion. Neck supple. No thyromegaly present.  Soft tissue swelling to anterior neck,  non tender, no erythema, has been there for several years per patient  Cardiovascular: Normal rate, regular rhythm and intact distal pulses.   Murmur heard. Pulmonary/Chest: Effort normal and breath sounds normal. No respiratory distress. She has no wheezes. She has no rales.  Pacemaker to left chest wall  Abdominal: Soft. Bowel sounds are normal. She exhibits no distension. There is no tenderness. There is no rebound and no guarding.  Right inguinal hernia, reducible, size slightly bigger than golf ball.   Musculoskeletal: She exhibits edema and deformity.  Can move all 4 extremities, arthritis changes to her fingers. Left 2 nd toe hammer toe, callus to front of her toe. No signs of infection,   Lymphadenopathy:    She has no cervical adenopathy.  Neurological: She is alert and oriented to person, place, and time.  Skin: Skin is warm and dry. No rash noted. She is not diaphoretic. No erythema. No pallor.  Psychiatric: She has a normal mood and affect. Her behavior is normal.    Labs reviewed: Basic Metabolic Panel:  Recent Labs  07/01/16 0755 12/15/16 0807  NA 141 143  K 4.4 3.9  CL 104 107  CO2 29 26  GLUCOSE 105* 95  BUN 25 20  CREATININE 0.70 0.74  CALCIUM 9.4 9.3   Liver Function Tests:  Recent Labs  07/01/16 0755 12/15/16 0807  AST 16 15  ALT 11 7  ALKPHOS 75 68  BILITOT 0.5 0.4  PROT 6.8 6.2  ALBUMIN 3.8 3.6   No results for input(s): LIPASE, AMYLASE in the last 8760 hours. No results for input(s): AMMONIA in the last 8760 hours. CBC:  Recent Labs  07/01/16 0755  WBC 5.1  NEUTROABS 2,550  HGB 13.0  HCT 39.8  MCV 99.3  PLT 117*   Cardiac Enzymes: No results for input(s): CKTOTAL, CKMB, CKMBINDEX, TROPONINI in the last 8760 hours. BNP: Invalid input(s): POCBNP Lab Results  Component Value Date   HGBA1C 5.2 12/15/2016   Lab Results  Component Value Date   TSH 0.80 12/15/2016   No results found for: VITAMINB12 No results found for: FOLATE No  results found for: IRON, TIBC, FERRITIN  Lipid Panel:  Recent Labs  07/01/16 0755  CHOL 158  HDL 71  LDLCALC 76  TRIG 57  CHOLHDL 2.2   Lab Results  Component Value Date   HGBA1C 5.2 12/15/2016    Procedures since last visit: No results found.  Assessment/Plan  1. Obstructive sleep apnea Continue CPAP.  - CBC with Differential/Platelets; Future  2. Achalasia of esophagus S/p dilation. Continue H2 blocker. Small bites with sip of water if needed.  - CBC with Differential/Platelets; Future  3. Osteopenia, unspecified location Continue calcium and vit d supplement - DG Bone Density; Future - CMP with eGFR; Future - CBC with Differential/Platelets; Future  4. Hammer toe of left foot Podiatry referral at the facility.   5. Edema, unspecified type Chronic, stable, continue prn lasix.   6. Right inguinal hernia Stable, monitor for pain, increase in size.   7. S/P placement of cardiac pacemaker With complete heart blokc. Followed by cardiology service.   8. Atrophic vaginitis C/w premarin cream  9. Hyperlipidemia, unspecified hyperlipidemia type continue fish oil and coenzyme - CMP with eGFR; Future - Lipid Panel; Future - CBC with Differential/Platelets; Future  10. Mixed incontinence urge and stress Seen by urology in  past. Failed myrbetriq. Supportive care for now.   11. Estrogen deficiency Continue premarin cream twice a week. Obtain dexa.  - Lipid Panel; Future  Labs/tests ordered:  As above  Next appointment: 4 months for physical exam with [facility]  Communication: reviewed care plan with patient    Blanchie Serve, MD Internal Medicine [place], [place] [address] Cell Phone (Monday-Friday 8 am - 5 pm): [phone] On Call: [phone] and follow prompts after 5 pm and on weekends Office Phone: [phone] Office Fax: [phone]

## 2017-04-29 ENCOUNTER — Encounter: Payer: Self-pay | Admitting: Cardiology

## 2017-05-03 ENCOUNTER — Telehealth: Payer: Self-pay | Admitting: Internal Medicine

## 2017-05-03 NOTE — Telephone Encounter (Signed)
Left msg asking pt to schedule AWV-I at [facility] clinic on Friday, 05/13/17 if available. VDM (DD)

## 2017-05-06 ENCOUNTER — Encounter: Payer: Self-pay | Admitting: Internal Medicine

## 2017-05-09 ENCOUNTER — Other Ambulatory Visit: Payer: Medicare Other

## 2017-05-13 ENCOUNTER — Non-Acute Institutional Stay: Payer: Medicare Other

## 2017-05-13 VITALS — BP 132/86 | HR 70 | Temp 97.5°F | Ht 61.0 in | Wt 150.0 lb

## 2017-05-13 DIAGNOSIS — Z0000 Encounter for general adult medical examination without abnormal findings: Secondary | ICD-10-CM

## 2017-05-13 MED ORDER — ZOSTER VAC RECOMB ADJUVANTED 50 MCG/0.5ML IM SUSR: 0.5000 mL | Freq: Once | INTRAMUSCULAR | 1 refills | Status: AC

## 2017-05-13 NOTE — Progress Notes (Signed)
Subjective:   [name] is a 83 y.o. female who presents for an Initial Medicare Annual Wellness Visit at [facility]       Objective:    Today's Vitals   05/13/17 1050  BP: 132/86  Pulse: 70  SpO2: 97%  Weight: 150 lb (68 kg)  Height: 5\' 1"  (1.549 m)   Body mass index is 28.34 kg/m.   Current Medications (verified) Outpatient Encounter Prescriptions as of 05/13/2017  Medication Sig  . acetaminophen (TYLENOL) 325 MG tablet Take 650 mg by mouth as needed for moderate pain (arthritis pain). Reported on 08/18/2015  . ascorbic acid (VITAMIN C) 500 MG tablet Take 500 mg by mouth daily. Reported on 08/26/2015  . b complex vitamins capsule Take 1 capsule by mouth daily.  . Calcium Carbonate (CALCIUM 600 PO) Take 600 mg by mouth daily.  . Cholecalciferol (VITAMIN D-3) 1000 units CAPS Take 1,000 Units by mouth daily.   Marland Kitchen co-enzyme Q-10 30 MG capsule Take 30 mg by mouth daily.  . furosemide (LASIX) 20 MG tablet Take 20 mg by mouth as needed (swelling).  . Multiple Vitamin (MULTIVITAMIN) tablet Take 1 tablet by mouth daily.  . Omega 3 1200 MG CAPS Take 1,200 mg by mouth at bedtime.   Marland Kitchen PREMARIN vaginal cream Apply 1 Applicatorful topically 2 (two) times a week.   . Probiotic Product (PROBIOTIC DAILY PO) Take 1 capsule by mouth daily.   . ranitidine (ZANTAC) 150 MG tablet Take 150 mg by mouth 2 (two) times daily. Take one tablet in morning and one in evening   No facility-administered encounter medications on file as of 05/13/2017.     Allergies (verified) Actonel [risedronate sodium] and Ivp dye [iodinated diagnostic agents]   History: Past Medical History:  Diagnosis Date  . Achalasia   . Anemia   . Arthritis    back  . Chronic steroid use   . Complete heart block ([place])    S/P PACEMAKER 2000 W/ GENERATOR CHANGE 2009  . Complete heart block ([place])   . Empyema, right ([place]) PULMOLOGIST-- DR CLANCE   VATS 06/23/2012 cultures negative to  date CXR 07/19/12 persistent airfluid levels/  CXR 11-01-2012 IMPROVE RIGHT PLEURAL EFFUSION  . GERD (gastroesophageal reflux disease)   . H/O echocardiogram    NORMAL PER PT DONE AT [facility]  . History of aspiration pneumonitis    DEC 2013  . History of hiatal hernia   . Hyperglycemia 07/17/2013  . Hypertension   . Inguinal hernia    right  . Intrinsic urethral sphincter deficiency   . Megaesophagus   . Mixed stress and urge urinary incontinence   . Multinodular thyroid 06/26/2012   Multi nodular goiter. Large nodules in both lobes of the gland.  These nodules fit national criteria for fine needle aspiration  biopsy if not previously assessed.    . Normal cardiac stress test    PER PT AT [facility]  . OSA on CPAP    cpap, doees not know settings  . Pacemaker MEDTRONIC--  LAST PACER CHECK 05-09-2013  PER PT CHECK OUT OK   CARIOLOGIST--  DR RUTH GREENFIELD AT [facility]--  PT HAS HAD NORMAL STRESS TEST AND ECHO   . Polymyalgia rheumatica (H[facility])    on chronic Prednisone 5mg  daily  . RBBB   . S/P dilatation of esophageal stricture    Past Surgical History:  Procedure Laterality Date  . APPENDECTOMY  1953   w/ removal benign kidney tumor   .  BALLOON DILATION  07/24/2012   Procedure: BALLOON DILATION;  Surgeon: Inda Castle, MD;  Location: [facility];  Service: Endoscopy;  Laterality: N/A;  . BALLOON DILATION N/A 12/03/2015   Procedure: BALLOON DILATION;  Surgeon: Mauri Pole, MD;  Location: [facility];  Service: Endoscopy;  Laterality: N/A;  pnuematic balloon  . BALLOON DILATION N/A 03/16/2017   Procedure: BALLOON DILATION;  Surgeon: Mauri Pole, MD;  Location: [facility];  Service: Endoscopy;  Laterality: N/A;  PNUEMATIC BALLOONS  . BOTOX INJECTION  08/07/2012   Procedure: BOTOX INJECTION;  Surgeon: Inda Castle, MD;  Location: [facility];  Service: Endoscopy;  Laterality: N/A;  . BOTOX INJECTION N/A 05/24/2013   Procedure: MACROPLASTIQUE IMPLANT;  Surgeon: Irine Seal, MD;   Location: [facility];  Service: Urology;  Laterality: N/A;  . BOTOX INJECTION  02/25/2014   Procedure: BOTOX INJECTION;  Surgeon: Inda Castle, MD;  Location: [facility];  Service: Endoscopy;;  . CARDIAC PACEMAKER PLACEMENT  06/1999  DR RUTH GREENFIELD AT [facility]  ( LAST PACER CHECK 05-09-2013) for CHB/   END-OF-LIFE GENERATOR CHANGE  2009  . CATARACT EXTRACTION W/ INTRAOCULAR LENS  IMPLANT, BILATERAL  2005  . CATARACT EXTRACTION W/ INTRAOCULAR LENS  IMPLANT, BILATERAL    . DILATION AND CURETTAGE OF UTERUS    . ESOPHAGOGASTRODUODENOSCOPY  07/24/2012   Procedure: ESOPHAGOGASTRODUODENOSCOPY (EGD);  Surgeon: Inda Castle, MD;  Location: [facility];  Service: Endoscopy;  Laterality: N/A;  . ESOPHAGOGASTRODUODENOSCOPY  08/07/2012   Procedure: ESOPHAGOGASTRODUODENOSCOPY (EGD);  Surgeon: Inda Castle, MD;  Location: [facility];  Service: Endoscopy;  Laterality: N/A;  . ESOPHAGOGASTRODUODENOSCOPY N/A 02/25/2014   Procedure: ESOPHAGOGASTRODUODENOSCOPY (EGD);  Surgeon: Inda Castle, MD;  Location: [facility];  Service: Endoscopy;  Laterality: N/A;  . ESOPHAGOGASTRODUODENOSCOPY N/A 03/16/2017   Procedure: ESOPHAGOGASTRODUODENOSCOPY (EGD);  Surgeon: Mauri Pole, MD;  Location: [facility] ENDOSCOPY;  Service: Endoscopy;  Laterality: N/A;  . ESOPHAGOGASTRODUODENOSCOPY (EGD) WITH ESOPHAGEAL DILATION  06/27/2012   Procedure: ESOPHAGOGASTRODUODENOSCOPY (EGD) WITH ESOPHAGEAL DILATION;  Surgeon: Ladene Artist, MD,FACG;  Location: [facility];  Service: Endoscopy;  Laterality: N/A;  . ESOPHAGOGASTRODUODENOSCOPY (EGD) WITH PROPOFOL N/A 10/09/2015   Procedure: ESOPHAGOGASTRODUODENOSCOPY (EGD) WITH PROPOFOL ( WITH BOTOX);  Surgeon: Milus Banister, MD;  Location: [facility];  Service: Endoscopy;  Laterality: N/A;  . ESOPHAGOGASTRODUODENOSCOPY (EGD) WITH PROPOFOL N/A 12/03/2015   Procedure: ESOPHAGOGASTRODUODENOSCOPY (EGD) WITH PROPOFOL;  Surgeon: Mauri Pole, MD;   Location: [facility]COPY;  Service: Endoscopy;  Laterality: N/A;  . JOINT REPLACEMENT     right hip, 2001  . PACEMAKER GENERATOR CHANGE  12/13/2007   at [facility]  . TOE SURGERY  2013   left 3rd toe HAMMERTOE REPAIR  . TONSILLECTOMY  AS CHILD  . TOTAL HIP ARTHROPLASTY Right 2001  . VERICOSE VEIN LIGATION    . VIDEO ASSISTED THORACOSCOPY (VATS)/DECORTICATION  06/23/2012   Procedure: VIDEO ASSISTED THORACOSCOPY (VATS)/DECORTICATION;  Surgeon: Grace Isaac, MD;  Location: [facility];  Service: Thoracic;  Laterality: Right;  Marland Kitchen VIDEO ASSISTED THORACOSCOPY (VATS)/EMPYEMA     06/23/2012  . VIDEO BRONCHOSCOPY  06/23/2012   Procedure: VIDEO BRONCHOSCOPY;  Surgeon: Grace Isaac, MD;  Location: [facility] OR;  Service: Thoracic;  Laterality: N/A;   Family History  Problem Relation Age of Onset  . Arthritis Father   . Heart disease Father   . Cancer Brother        ?spine  . Heart disease Brother   . Lung cancer  Brother   . Diabetes Son   . Heart disease Mother        Congestive heart failure  . Heart disease Brother   . Arthritis Brother   . Hypertension Brother   . Colon cancer Neg Hx    Social History   Occupational History  . Retired Surveyor, quantity    Social History Main Topics  . Smoking status: Passive Smoke Exposure - Never Smoker  . Smokeless tobacco: Never Used     Comment: Exposure through father only.  . Alcohol use 1.8 oz/week    3 Glasses of wine per week     Comment: 1 drink x 3 days/week  . Drug use: No  . Sexual activity: No     Comment: married    Tobacco Counseling Counseling given: Not Answered   Activities of Daily Living In your present state of health, do you have any difficulty performing the following activities: 05/13/2017  Hearing? N  Vision? N  Difficulty concentrating or making decisions? Y  Walking or climbing stairs? N  Dressing or bathing? N  Doing errands, shopping? N  Preparing Food and eating ? N  Using the Toilet? N  In the past six  months, have you accidently leaked urine? Y  Do you have problems with loss of bowel control? N  Managing your Medications? N  Managing your Finances? N  Housekeeping or managing your Housekeeping? N  Some recent data might be hidden    Immunizations and Health Maintenance Immunization History  Administered Date(s) Administered  . Influenza Split 09/11/2012, 04/12/2013, 04/30/2015  . Influenza, High Dose Seasonal PF 04/18/2017  . Influenza-Unspecified 05/03/2014, 04/10/2015, 04/22/2016, 04/20/2017  . Pneumococcal Conjugate-13 04/16/2015  . Pneumococcal Polysaccharide-23 07/12/2004  . Td 01/10/2012  . Tdap 11/05/2015   Health Maintenance Due  Topic Date Due  . DEXA SCAN  11/18/1995    Patient Care Team: Blanchie Serve, MD as PCP - General (Internal Medicine) [place], [facility] [name], MD as Consulting Physician (Cardiology) Irine Seal, MD as Attending Physician (Urology) Clance, Armando Reichert, MD as Consulting Physician (Pulmonary Disease) Grace Isaac, MD as Consulting Physician (Cardiothoracic Surgery) Mauri Pole, MD as Consulting Physician (Gastroenterology)  Indicate any recent Medical Services you may have received from other than [facility] providers in the past year (date may be approximate).     Assessment:   This is a routine wellness examination for [name].    Hearing/Vision screen No exam data present  Dietary issues and exercise activities discussed: Current Exercise Habits: Home exercise routine;Structured exercise class, Type of exercise: Other - see comments (FHW classes), Time (Minutes): 30, Frequency (Times/Week): 3, Weekly Exercise (Minutes/Week): 90, Intensity: Mild  Goals    . Maintain LIfestyle          Starting today pt will maintain lifestyle.       Depression Screen PHQ 2/9 Scores 05/13/2017 07/06/2016 08/18/2015 04/08/2015  PHQ - 2 Score 0 0 0 0    Fall Risk Fall Risk  05/13/2017 07/06/2016 11/25/2015 08/26/2015 04/08/2015    Falls in the past year? Yes Yes No Yes No  Number falls in past yr: 1 2 or more - 1 -  Injury with Fall? No No - No -    Cognitive Function: MMSE - Mini Mental State Exam 05/13/2017 07/06/2016  Orientation to time 5 5  Orientation to Place 5 5  Registration 3 3  Attention/ Calculation 5 5  Recall 3 3  Language- name 2 objects 2  2  Language- repeat 1 1  Language- follow 3 step command 3 3  Language- read & follow direction 1 1  Write a sentence 1 1  Copy design 1 1  Total score 30 30        Screening Tests Health Maintenance  Topic Date Due  . DEXA SCAN  11/18/1995  . TETANUS/TDAP  11/04/2025  . INFLUENZA VACCINE  Completed  . PNA vac Low Risk Adult  Completed      Plan:    I have personally reviewed and addressed the Medicare Annual Wellness questionnaire and have noted the following in the patient's chart:  A. Medical and social history B. Use of alcohol, tobacco or illicit drugs  C. Current medications and supplements D. Functional ability and status E.  Nutritional status F.  Physical activity G. Advance directives H. List of other physicians I.  Hospitalizations, surgeries, and ER visits in previous 12 months J.  [place] to include hearing, vision, cognitive, depression L. Referrals and appointments - none  In addition, I have reviewed and discussed with patient certain preventive protocols, quality metrics, and best practice recommendations. A written personalized care plan for preventive services as well as general preventive health recommendations were provided to patient.  See attached scanned questionnaire for additional information.   Signed,   Rich Reining, RN Nurse Health Advisor   Quick Notes   Health Maintenance: DEXA is scheduled for 05/26/2017. Shingrix prescription sent to pharmacy.     Abnormal Screen: MMSE 30/30. Passed clock drawing.     Patient Concerns: slight lower back pain starting a week ago, she will keep  an eye on it and let us know if it gets worse.     Nurse Concerns: None

## 2017-05-13 NOTE — Patient Instructions (Signed)
Ms. Boom , Thank you for taking time to come for your Medicare Wellness Visit. I appreciate your ongoing commitment to your health goals. Please review the following plan we discussed and let me know if I can assist you in the future.   Screening recommendations/referrals: Colonoscopy excluded, you are over age 81 Mammogram excluded, you are over age 90 Bone Density due, you have this appointment on 05/26/2017 Recommended yearly ophthalmology/optometry visit for glaucoma screening and checkup Recommended yearly dental visit for hygiene and checkup  Vaccinations: Influenza vaccine up to date. Due 2019 Fall season Pneumococcal vaccine up to date Tdap vaccine up to date. Due 11/04/2025 Shingles vaccine due, prescription sent to pharmacy  Advanced directives: In Chart  Conditions/risks identified: None  Next appointment: Dr. Bubba Camp 08/24/2017 @ 11am   Preventive Care 65 Years and Older, Female Preventive care refers to lifestyle choices and visits with your health care provider that can promote health and wellness. What does preventive care include?  A yearly physical exam. This is also called an annual well check.  Dental exams once or twice a year.  Routine eye exams. Ask your health care provider how often you should have your eyes checked.  Personal lifestyle choices, including:  Daily care of your teeth and gums.  Regular physical activity.  Eating a healthy diet.  Avoiding tobacco and drug use.  Limiting alcohol use.  Practicing safe sex.  Taking low-dose aspirin every day.  Taking vitamin and mineral supplements as recommended by your health care provider. What happens during an annual well check? The services and screenings done by your health care provider during your annual well check will depend on your age, overall health, lifestyle risk factors, and family history of disease. Counseling  Your health care provider may ask you questions about  your:  Alcohol use.  Tobacco use.  Drug use.  Emotional well-being.  Home and relationship well-being.  Sexual activity.  Eating habits.  History of falls.  Memory and ability to understand (cognition).  Work and work Statistician.  Reproductive health. Screening  You may have the following tests or measurements:  Height, weight, and BMI.  Blood pressure.  Lipid and cholesterol levels. These may be checked every 5 years, or more frequently if you are over 51 years old.  Skin check.  Lung cancer screening. You may have this screening every year starting at age 25 if you have a 30-pack-year history of smoking and currently smoke or have quit within the past 15 years.  Fecal occult blood test (FOBT) of the stool. You may have this test every year starting at age 81.  Flexible sigmoidoscopy or colonoscopy. You may have a sigmoidoscopy every 5 years or a colonoscopy every 10 years starting at age 51.  Hepatitis C blood test.  Hepatitis B blood test.  Sexually transmitted disease (STD) testing.  Diabetes screening. This is done by checking your blood sugar (glucose) after you have not eaten for a while (fasting). You may have this done every 1-3 years.  Bone density scan. This is done to screen for osteoporosis. You may have this done starting at age 4.  Mammogram. This may be done every 1-2 years. Talk to your health care provider about how often you should have regular mammograms. Talk with your health care provider about your test results, treatment options, and if necessary, the need for more tests. Vaccines  Your health care provider may recommend certain vaccines, such as:  Influenza vaccine. This is recommended every year.  Tetanus, diphtheria, and acellular pertussis (Tdap, Td) vaccine. You may need a Td booster every 10 years.  Zoster vaccine. You may need this after age 82.  Pneumococcal 13-valent conjugate (PCV13) vaccine. One dose is recommended  after age 74.  Pneumococcal polysaccharide (PPSV23) vaccine. One dose is recommended after age 59. Talk to your health care provider about which screenings and vaccines you need and how often you need them. This information is not intended to replace advice given to you by your health care provider. Make sure you discuss any questions you have with your health care provider. Document Released: 07/25/2015 Document Revised: 03/17/2016 Document Reviewed: 04/29/2015 Elsevier Interactive Patient Education  2017 [place] Prevention in the Home Falls can cause injuries. They can happen to people of all ages. There are many things you can do to make your home safe and to help prevent falls. What can I do on the outside of my home?  Regularly fix the edges of walkways and driveways and fix any cracks.  Remove anything that might make you trip as you walk through a door, such as a raised step or threshold.  Trim any bushes or trees on the path to your home.  Use bright outdoor lighting.  Clear any walking paths of anything that might make someone trip, such as rocks or tools.  Regularly check to see if handrails are loose or broken. Make sure that both sides of any steps have handrails.  Any raised decks and porches should have guardrails on the edges.  Have any leaves, snow, or ice cleared regularly.  Use sand or salt on walking paths during winter.  Clean up any spills in your garage right away. This includes oil or grease spills. What can I do in the bathroom?  Use night lights.  Install grab bars by the toilet and in the tub and shower. Do not use towel bars as grab bars.  Use non-skid mats or decals in the tub or shower.  If you need to sit down in the shower, use a plastic, non-slip stool.  Keep the floor dry. Clean up any water that spills on the floor as soon as it happens.  Remove soap buildup in the tub or shower regularly.  Attach bath mats securely with  double-sided non-slip rug tape.  Do not have throw rugs and other things on the floor that can make you trip. What can I do in the bedroom?  Use night lights.  Make sure that you have a light by your bed that is easy to reach.  Do not use any sheets or blankets that are too big for your bed. They should not hang down onto the floor.  Have a firm chair that has side arms. You can use this for support while you get dressed.  Do not have throw rugs and other things on the floor that can make you trip. What can I do in the kitchen?  Clean up any spills right away.  Avoid walking on wet floors.  Keep items that you use a lot in easy-to-reach places.  If you need to reach something above you, use a strong step stool that has a grab bar.  Keep electrical cords out of the way.  Do not use floor polish or wax that makes floors slippery. If you must use wax, use non-skid floor wax.  Do not have throw rugs and other things on the floor that can make you trip. What can I do  with my stairs?  Do not leave any items on the stairs.  Make sure that there are handrails on both sides of the stairs and use them. Fix handrails that are broken or loose. Make sure that handrails are as long as the stairways.  Check any carpeting to make sure that it is firmly attached to the stairs. Fix any carpet that is loose or worn.  Avoid having throw rugs at the top or bottom of the stairs. If you do have throw rugs, attach them to the floor with carpet tape.  Make sure that you have a light switch at the top of the stairs and the bottom of the stairs. If you do not have them, ask someone to add them for you. What else can I do to help prevent falls?  Wear shoes that:  Do not have high heels.  Have rubber bottoms.  Are comfortable and fit you well.  Are closed at the toe. Do not wear sandals.  If you use a stepladder:  Make sure that it is fully opened. Do not climb a closed stepladder.  Make  sure that both sides of the stepladder are locked into place.  Ask someone to hold it for you, if possible.  Clearly mark and make sure that you can see:  Any grab bars or handrails.  First and last steps.  Where the edge of each step is.  Use tools that help you move around (mobility aids) if they are needed. These include:  Canes.  Walkers.  Scooters.  Crutches.  Turn on the lights when you go into a dark area. Replace any light bulbs as soon as they burn out.  Set up your furniture so you have a clear path. Avoid moving your furniture around.  If any of your floors are uneven, fix them.  If there are any pets around you, be aware of where they are.  Review your medicines with your doctor. Some medicines can make you feel dizzy. This can increase your chance of falling. Ask your doctor what other things that you can do to help prevent falls. This information is not intended to replace advice given to you by your health care provider. Make sure you discuss any questions you have with your health care provider. Document Released: 04/24/2009 Document Revised: 12/04/2015 Document Reviewed: 08/02/2014 Elsevier Interactive Patient Education  2017 [employer].

## 2017-05-22 LAB — HM DEXA SCAN

## 2017-05-26 ENCOUNTER — Ambulatory Visit
Admission: RE | Admit: 2017-05-26 | Discharge: 2017-05-26 | Disposition: A | Discharge status: Home / Self Care | Payer: Medicare Other | Source: Ambulatory Visit | Attending: Internal Medicine | Admitting: Internal Medicine

## 2017-05-26 DIAGNOSIS — Z780 Asymptomatic menopausal state: Secondary | ICD-10-CM | POA: Diagnosis not present

## 2017-05-26 DIAGNOSIS — M8580 Other specified disorders of bone density and structure, unspecified site: Secondary | ICD-10-CM

## 2017-05-26 DIAGNOSIS — M810 Age-related osteoporosis without current pathological fracture: Secondary | ICD-10-CM | POA: Diagnosis not present

## 2017-06-13 DIAGNOSIS — N952 Postmenopausal atrophic vaginitis: Secondary | ICD-10-CM | POA: Diagnosis not present

## 2017-06-13 DIAGNOSIS — N3946 Mixed incontinence: Secondary | ICD-10-CM | POA: Diagnosis not present

## 2017-06-16 ENCOUNTER — Other Ambulatory Visit: Payer: Self-pay

## 2017-07-25 ENCOUNTER — Ambulatory Visit (INDEPENDENT_AMBULATORY_CARE_PROVIDER_SITE_OTHER): Payer: Medicare Other | Admitting: *Deleted

## 2017-07-25 DIAGNOSIS — I495 Sick sinus syndrome: Secondary | ICD-10-CM | POA: Diagnosis not present

## 2017-07-26 NOTE — Progress Notes (Signed)
Remote pacemaker transmission.   

## 2017-07-27 LAB — CUP PACEART REMOTE DEVICE CHECK
Battery Impedance: 2606 Ohm
Battery Remaining Longevity: 26 mo
Battery Voltage: 2.73 V
Brady Statistic AP VS Percent: 17 %
Date Time Interrogation Session: 20190114151809
Implantable Lead Implant Date: 20001227
Implantable Lead Location: 753859
Implantable Lead Model: 5076
Implantable Lead Model: 5076
Implantable Pulse Generator Implant Date: 20090603
Lead Channel Impedance Value: 655 Ohm
Lead Channel Pacing Threshold Pulse Width: 0.4 ms
Lead Channel Pacing Threshold Pulse Width: 0.4 ms
Lead Channel Setting Pacing Amplitude: 2.5 V
Lead Channel Setting Pacing Pulse Width: 0.4 ms
MDC IDC LEAD IMPLANT DT: 20001227
MDC IDC LEAD LOCATION: 753860
MDC IDC MSMT LEADCHNL RA IMPEDANCE VALUE: 440 Ohm
MDC IDC MSMT LEADCHNL RA PACING THRESHOLD AMPLITUDE: 0.5 V
MDC IDC MSMT LEADCHNL RV PACING THRESHOLD AMPLITUDE: 0.5 V
MDC IDC SET LEADCHNL RA PACING AMPLITUDE: 2 V
MDC IDC SET LEADCHNL RV SENSING SENSITIVITY: 4 mV
MDC IDC STAT BRADY AP VP PERCENT: 9 %
MDC IDC STAT BRADY AS VP PERCENT: 0 %
MDC IDC STAT BRADY AS VS PERCENT: 73 %

## 2017-07-29 ENCOUNTER — Encounter: Payer: Self-pay | Admitting: Cardiology

## 2017-08-17 ENCOUNTER — Encounter: Payer: Self-pay | Admitting: Internal Medicine

## 2017-08-24 ENCOUNTER — Encounter: Payer: Medicare Other | Admitting: Internal Medicine

## 2017-08-31 ENCOUNTER — Encounter: Payer: Self-pay | Admitting: Internal Medicine

## 2017-08-31 ENCOUNTER — Non-Acute Institutional Stay: Payer: Medicare Other | Admitting: Internal Medicine

## 2017-08-31 VITALS — BP 140/74 | HR 77 | Temp 97.5°F | Resp 16 | Ht 60.0 in | Wt 152.4 lb

## 2017-08-31 DIAGNOSIS — N3946 Mixed incontinence: Secondary | ICD-10-CM | POA: Diagnosis not present

## 2017-08-31 DIAGNOSIS — M810 Age-related osteoporosis without current pathological fracture: Secondary | ICD-10-CM | POA: Diagnosis not present

## 2017-08-31 DIAGNOSIS — G4733 Obstructive sleep apnea (adult) (pediatric): Secondary | ICD-10-CM

## 2017-08-31 DIAGNOSIS — E663 Overweight: Secondary | ICD-10-CM

## 2017-08-31 DIAGNOSIS — I495 Sick sinus syndrome: Secondary | ICD-10-CM | POA: Diagnosis not present

## 2017-08-31 DIAGNOSIS — K220 Achalasia of cardia: Secondary | ICD-10-CM | POA: Diagnosis not present

## 2017-08-31 DIAGNOSIS — L84 Corns and callosities: Secondary | ICD-10-CM

## 2017-08-31 NOTE — Progress Notes (Signed)
[facility]  Provider: Blanchie Serve MD   Location:  [place] Service:  Clinic (12)  PCP: Blanchie Serve, MD Patient Care Team: Blanchie Serve, MD as PCP - General (Internal Medicine) [place], [facility] [name], MD as Consulting Physician (Cardiology) Irine Seal, MD as Attending Physician (Urology) Clance, Armando Reichert, MD as Consulting Physician (Pulmonary Disease) Grace Isaac, MD as Consulting Physician (Cardiothoracic Surgery) Mauri Pole, MD as Consulting Physician (Gastroenterology)  Extended Emergency Contact Information Primary Emergency Contact: Schmale,Francis Address: [phone], [place] [address] Johnnette Litter of [place] Phone: [phone] Relation: Spouse Secondary Emergency Contact: Westby,Dorothy Address: [address]          [place], [place] [address] Johnnette Litter of [place] Phone: [phone] Mobile Phone: [phone] Relation: Daughter  Goals of Care: Advanced Directive information Advanced Directives 05/13/2017  Does Patient Have a Medical Advance Directive? Yes  Type of Advance Directive [place]  Does patient want to make changes to medical advance directive? No - Patient declined  Copy of [place] in Chart? Yes  Would patient like information on creating a medical advance directive? -  Pre-existing out of facility DNR order (yellow form or pink MOST form) -      Chief Complaint  Patient presents with  . Medical Management of Chronic Issues    4 month follow up  . Medication Refill    No refills needed at this time  . MMSE    30/30. Passed clock drawing    HPI: Patient is a 84 y.o. female seen today for routine visit.   achalasia- has been still having trouble with food being stuck in her food pipe and sometimes she has to induce vomiting to relief herself. Currently on ranitidine and recently underwent dilatation.    UI- with episodes of incontinence. currently on premarin cream twice a week with some help. Not on myrbetriq at present for unclear reason. Reviewed urology note from 02/2017. No recent note for review. S/p macroplastique in 2014.  Sick sinus syndrome- s/p pacemaker 2000, occasional palpitation. Has follow up in April 2019  Sleep apnea- uses CPAP machine at bedtime, tolerating well, feels rested in am.   Leg edema- increases towards end of day, has not required lasix for sometime now.   HLD- on fish oil and co enzyme  Osteoporosis- taking calcium and vitamin d supplement. No fall reported.   Hammer toe- to left  middle toe bothering her  Past Medical History:  Diagnosis Date  . Achalasia   . Anemia   . Arthritis    back  . Chronic steroid use   . Complete heart block ([place])    S/P PACEMAKER 2000 W/ GENERATOR CHANGE 2009  . Complete heart block ([place])   . Empyema, right ([place]) PULMOLOGIST-- DR CLANCE   VATS 06/23/2012 cultures negative to date CXR 07/19/12 persistent airfluid levels/  CXR 11-01-2012 IMPROVE RIGHT PLEURAL EFFUSION  . GERD (gastroesophageal reflux disease)   . H/O echocardiogram    NORMAL PER PT DONE AT [facility]  . History of aspiration pneumonitis    DEC 2013  . History of hiatal hernia   . Hyperglycemia 07/17/2013  . Hypertension   . Inguinal hernia    right  . Intrinsic urethral sphincter deficiency   . Megaesophagus   . Mixed stress and urge urinary incontinence   .  Multinodular thyroid 06/26/2012   Multi nodular goiter. Large nodules in both lobes of the gland.  These nodules fit national criteria for fine needle aspiration  biopsy if not previously assessed.    . Normal cardiac stress test    PER PT AT [facility]  . OSA on CPAP    cpap, doees not know settings  . Pacemaker [employer]IC--  LAST PACER CHECK 05-09-2013  PER PT CHECK OUT OK   CARIOLOGIST--  DR RUTH GREENFIELD AT [facility]--  PT HAS HAD NORMAL STRESS TEST AND ECHO   . Polymyalgia rheumatica (HCC)    on  chronic Prednisone '5mg'$  daily  . RBBB   . S/P dilatation of esophageal stricture    Past Surgical History:  Procedure Laterality Date  . APPENDECTOMY  1953   w/ removal benign kidney tumor   . BALLOON DILATION  07/24/2012   Procedure: BALLOON DILATION;  Surgeon: Inda Castle, MD;  Location: [facility];  Service: Endoscopy;  Laterality: N/A;  . BALLOON DILATION N/A 12/03/2015   Procedure: BALLOON DILATION;  Surgeon: Mauri Pole, MD;  Location: [facility];  Service: Endoscopy;  Laterality: N/A;  pnuematic balloon  . BALLOON DILATION N/A 03/16/2017   Procedure: BALLOON DILATION;  Surgeon: Mauri Pole, MD;  Location: [facility];  Service: Endoscopy;  Laterality: N/A;  PNUEMATIC BALLOONS  . BOTOX INJECTION  08/07/2012   Procedure: BOTOX INJECTION;  Surgeon: Inda Castle, MD;  Location: [facility];  Service: Endoscopy;  Laterality: N/A;  . BOTOX INJECTION N/A 05/24/2013   Procedure: MACROPLASTIQUE IMPLANT;  Surgeon: Irine Seal, MD;  Location: [facility];  Service: Urology;  Laterality: N/A;  . BOTOX INJECTION  02/25/2014   Procedure: BOTOX INJECTION;  Surgeon: Inda Castle, MD;  Location: [facility];  Service: Endoscopy;;  . CARDIAC PACEMAKER PLACEMENT  06/1999  DR RUTH GREENFIELD AT [facility]  ( LAST PACER CHECK 05-09-2013) for CHB/   END-OF-LIFE GENERATOR CHANGE  2009  . CATARACT EXTRACTION W/ INTRAOCULAR LENS  IMPLANT, BILATERAL  2005  . CATARACT EXTRACTION W/ INTRAOCULAR LENS  IMPLANT, BILATERAL    . DILATION AND CURETTAGE OF UTERUS    . ESOPHAGOGASTRODUODENOSCOPY  07/24/2012   Procedure: ESOPHAGOGASTRODUODENOSCOPY (EGD);  Surgeon: Inda Castle, MD;  Location: [facility];  Service: Endoscopy;  Laterality: N/A;  . ESOPHAGOGASTRODUODENOSCOPY  08/07/2012   Procedure: ESOPHAGOGASTRODUODENOSCOPY (EGD);  Surgeon: Inda Castle, MD;  Location: [facility];  Service: Endoscopy;  Laterality: N/A;  . ESOPHAGOGASTRODUODENOSCOPY N/A 02/25/2014    Procedure: ESOPHAGOGASTRODUODENOSCOPY (EGD);  Surgeon: Inda Castle, MD;  Location: [facility];  Service: Endoscopy;  Laterality: N/A;  . ESOPHAGOGASTRODUODENOSCOPY N/A 03/16/2017   Procedure: ESOPHAGOGASTRODUODENOSCOPY (EGD);  Surgeon: Mauri Pole, MD;  Location[facility];  Service: Endoscopy;  Laterality: N/A;  . ESOPHAGOGASTRODUODENOSCOPY (EGD) WITH ESOPHAGEAL DILATION  06/27/2012   Procedure: ESOPHAGOGASTRODUODENOSCOPY (EGD) WITH ESOPHAGEAL DILATION;  Surgeon: Ladene Artist, MD,FACG;  Location: [facility];  Service: Endoscopy;  Laterality: N/A;  . ESOPHAGOGASTRODUODENOSCOPY (EGD) WITH PROPOFOL N/A 10/09/2015   Procedure: ESOPHAGOGASTRODUODENOSCOPY (EGD) WITH PROPOFOL ( WITH BOTOX);  Surgeon: Milus Banister, MD;  Location: [facility];  Service: Endoscopy;  Laterality: N/A;  . ESOPHAGOGASTRODUODENOSCOPY (EGD) WITH PROPOFOL N/A 12/03/2015   Procedure: ESOPHAGOGASTRODUODENOSCOPY (EGD) WITH PROPOFOL;  Surgeon: Mauri Pole, MD;  Location: [facility];  Service: Endoscopy;  Laterality: N/A;  . JOINT REPLACEMENT     right hip, 2001  . PACEMAKER GENERATOR CHANGE  12/13/2007   at [facility]  .  TOE SURGERY  2013   left 3rd toe HAMMERTOE REPAIR  . TONSILLECTOMY  AS CHILD  . TOTAL HIP ARTHROPLASTY Right 2001  . VERICOSE VEIN LIGATION    . VIDEO ASSISTED THORACOSCOPY (VATS)/DECORTICATION  06/23/2012   Procedure: VIDEO ASSISTED THORACOSCOPY (VATS)/DECORTICATION;  Surgeon: Grace Isaac, MD;  Location: [facility];  Service: Thoracic;  Laterality: Right;  Marland Kitchen VIDEO ASSISTED THORACOSCOPY (VATS)/EMPYEMA     06/23/2012  . VIDEO BRONCHOSCOPY  06/23/2012   Procedure: VIDEO BRONCHOSCOPY;  Surgeon: Grace Isaac, MD;  Location: [facility];  Service: Thoracic;  Laterality: N/A;    reports that she is a non-smoker but has been exposed to tobacco smoke. she has never used smokeless tobacco. She reports that she drinks about 1.8 oz of alcohol per week. She reports that she does not use  drugs. Social History   Socioeconomic History  . Marital status: Married    Spouse name: Not on file  . Number of children: 50  . Years of education: Not on file  . Highest education level: Not on file  Social Needs  . Financial resource strain: Not on file  . Food insecurity - worry: Not on file  . Food insecurity - inability: Not on file  . Transportation needs - medical: Not on file  . Transportation needs - non-medical: Not on file  Occupational History  . Occupation: Retired Surveyor, quantity  Tobacco Use  . Smoking status: Passive Smoke Exposure - Never Smoker  . Smokeless tobacco: Never Used  . Tobacco comment: Exposure through father only.  Substance and Sexual Activity  . Alcohol use: Yes    Alcohol/week: 1.8 oz    Types: 3 Glasses of wine per week    Comment: 1 drink x 3 days/week  . Drug use: No  . Sexual activity: No    Comment: married  Other Topics Concern  . Not on file  Social History Narrative   Lives at [facility] since 05/18/2012   Married   Pacemaker   POA   Never smoked   Alcohol-wine 2-3 nights a week    Exercise - exercise classes 3 days a week    [employer] [facility] at [facility]      [facility]:   Originally from [place]. Has lived in [place], [place], [place], & moved to [place] in 1961. 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 No bird exposure.     Functional Status Survey:    Family History  Problem Relation Age of Onset  . Arthritis Father   . Heart disease Father   . Cancer Brother        ?spine  . Heart disease Brother   . Lung cancer Brother   . Diabetes Son   . Heart disease Mother        Congestive heart failure  . Heart disease Brother   . Arthritis Brother   . Hypertension Brother   . Colon cancer Neg Hx     Health Maintenance  Topic Date Due  . TETANUS/TDAP  11/04/2025  . INFLUENZA VACCINE  Completed  . DEXA SCAN  Completed  . PNA vac Low Risk Adult   Completed    Allergies  Allergen Reactions  . Actonel [Risedronate Sodium] Other (See Comments)    Joint aches; rechallenged --caused joint aches  . Ivp Dye [Iodinated Diagnostic Agents] Nausea And Vomiting    Outpatient Encounter Medications as of 08/31/2017  Medication Sig  . acetaminophen (TYLENOL)  325 MG tablet Take 650 mg by mouth as needed for moderate pain (arthritis pain). Reported on 08/18/2015  . ascorbic acid (VITAMIN C) 500 MG tablet Take 500 mg by mouth daily. Reported on 08/26/2015  . b complex vitamins capsule Take 1 capsule by mouth daily.  . Calcium Carbonate (CALCIUM 600 PO) Take 600 mg by mouth daily.  . Cholecalciferol (VITAMIN D-3) 1000 units CAPS Take 1,000 Units by mouth daily.   Marland Kitchen co-enzyme Q-10 30 MG capsule Take 30 mg by mouth daily.  . furosemide (LASIX) 20 MG tablet Take 20 mg by mouth as needed (swelling).  . Omega 3 1200 MG CAPS Take 1,200 mg by mouth at bedtime.   Marland Kitchen PREMARIN vaginal cream Apply 1 Applicatorful topically 2 (two) times a week.   . ranitidine (ZANTAC) 150 MG tablet Take 150 mg by mouth 2 (two) times daily. Take one tablet in morning and one in evening  . Multiple Vitamin (MULTIVITAMIN) tablet Take 1 tablet by mouth daily.  . Probiotic Product (PROBIOTIC DAILY PO) Take 1 capsule by mouth daily.    No facility-administered encounter medications on file as of 08/31/2017.     Review of Systems  Constitutional: Negative for appetite change, chills, fatigue and fever.  HENT: Positive for trouble swallowing. Negative for congestion, ear discharge, ear pain, mouth sores, postnasal drip, rhinorrhea, sinus pressure, sinus pain and sore throat.   Eyes: Negative for visual disturbance.       Reading glasses  Respiratory: Positive for choking. Negative for cough, shortness of breath and wheezing.   Cardiovascular: Positive for palpitations. Negative for chest pain and leg swelling.  Gastrointestinal: Negative for abdominal pain, blood in stool,  constipation, nausea and vomiting.  Genitourinary: Positive for frequency and urgency. Negative for dysuria, flank pain and hematuria.  Musculoskeletal: Positive for gait problem. Negative for back pain.  Neurological: Negative for dizziness and headaches.  Psychiatric/Behavioral: Negative for agitation, behavioral problems, confusion and sleep disturbance. The patient is not nervous/anxious.     Vitals:   08/31/17 1102  BP: 140/74  Pulse: 77  Resp: 16  Temp: (!) 97.5 F (36.4 C)  TempSrc: Oral  SpO2: 98%  Weight: 152 lb 6.4 oz (69.1 kg)  Height: 5' (1.524 m)   Body mass index is 29.76 kg/m.   Wt Readings from Last 3 Encounters:  08/31/17 152 lb 6.4 oz (69.1 kg)  05/13/17 150 lb (68 kg)  04/27/17 150 lb 3.2 oz (68.1 kg)   Physical Exam  Constitutional: She is oriented to person, place, and time.  Overweight, elderly female in no acute distress  HENT:  Head: Normocephalic and atraumatic.  Right Ear: External ear normal.  Left Ear: External ear normal.  Nose: Nose normal.  Mouth/Throat: Oropharynx is clear and moist. No oropharyngeal exudate.  Eyes: Conjunctivae and EOM are normal. Pupils are equal, round, and reactive to light. Right eye exhibits no discharge. Left eye exhibits no discharge.  Neck: Normal range of motion. Neck supple.  Cardiovascular: Normal rate and regular rhythm.  Murmur heard. Pacemaker to left chest wall  Pulmonary/Chest: Effort normal and breath sounds normal. She has no wheezes. She has no rales.  Abdominal: Soft. Bowel sounds are normal. She exhibits no distension. There is no tenderness. There is no rebound and no guarding.  Reducible right inguinal hernia  Musculoskeletal: She exhibits edema.  Left 2nd toe toe callus to base, hammer toe   Lymphadenopathy:    She has no cervical adenopathy.  Neurological: She is alert and  oriented to person, place, and time. No cranial nerve deficit. She exhibits normal muscle tone.  08/31/17 MMSE 30/30, passed  clock draw  Skin: Skin is warm and dry. She is not diaphoretic.  Psychiatric: She has a normal mood and affect. Her behavior is normal.    Labs reviewed: Basic Metabolic Panel: Recent Labs    12/15/16 0807  NA 143  K 3.9  CL 107  CO2 26  GLUCOSE 95  BUN 20  CREATININE 0.74  CALCIUM 9.3   Liver Function Tests: Recent Labs    12/15/16 0807  AST 15  ALT 7  ALKPHOS 68  BILITOT 0.4  PROT 6.2  ALBUMIN 3.6   No results for input(s): LIPASE, AMYLASE in the last 8760 hours. No results for input(s): AMMONIA in the last 8760 hours. CBC: No results for input(s): WBC, NEUTROABS, HGB, HCT, MCV, PLT in the last 8760 hours. Cardiac Enzymes: No results for input(s): CKTOTAL, CKMB, CKMBINDEX, TROPONINI in the last 8760 hours. BNP: Invalid input(s): POCBNP Lab Results  Component Value Date   HGBA1C 5.2 12/15/2016   Lab Results  Component Value Date   TSH 0.80 12/15/2016   No results found for: VITAMINB12 No results found for: FOLATE No results found for: IRON, TIBC, FERRITIN  Lipid Panel: No results for input(s): CHOL, HDL, LDLCALC, TRIG, CHOLHDL, LDLDIRECT in the last 8760 hours. Lab Results  Component Value Date   HGBA1C 5.2 12/15/2016    Procedures since last visit: No results found.  Assessment/Plan  1. Sick sinus syndrome (HCC) With pacemaker, monitor clinically - CMP with eGFR([facility]); Future - Lipid Panel; Future - CBC with Differential/Platelets; Future  2. Achalasia of esophagus Continue ranitidine, followed by GI   3. Overweight Dietary counselling. Exercise as tolerated. Labs as below - CMP with eGFR([facility]); Future - Lipid Panel; Future - CBC with Differential/Platelets; Future  4. Age-related osteoporosis without current pathological fracture T score -2.9 at forearm radius. With her achlasia and GERD, defer fosamax. Consider prolia injection after reviewing her lab result. Continue calcium and vitamin d supplement. Pt voices agreement.   5.  Callus With hammer toe, podiatry referral within the facility  6. Obstructive sleep apnea syndrome Continue using CPAP, weight loss encouraged  7. Mixed stress and urge urinary incontinence Off myrbetriq due to elevated BP, mentions premarin to be helpful. Monitor. Urology follow up   Labs/tests ordered:   Lab Orders     CMP with eGFR([facility])     Lipid Panel     CBC with Differential/Platelets  Next appointment: 4 months for physical  Communication: reviewed care plan with patient     Blanchie Serve, MD Internal Medicine [place], [place] [address] Cell Phone (Monday-Friday 8 am - 5 pm): [phone] On Call: [phone] and follow prompts after 5 pm and on weekends Office Phone: [phone] Office Fax: [phone]

## 2017-08-31 NOTE — Patient Instructions (Signed)
  Take your calcium and vitamin d supplement. You need to see a foot specialist for your hammer toe.

## 2017-09-05 DIAGNOSIS — I495 Sick sinus syndrome: Secondary | ICD-10-CM | POA: Diagnosis not present

## 2017-09-05 DIAGNOSIS — E663 Overweight: Secondary | ICD-10-CM | POA: Diagnosis not present

## 2017-09-05 LAB — COMPLETE METABOLIC PANEL WITH GFR
AG Ratio: 1.4 (calc) (ref 1.0–2.5)
ALBUMIN MSPROF: 3.9 g/dL (ref 3.6–5.1)
ALT: 9 U/L (ref 6–29)
AST: 15 U/L (ref 10–35)
Alkaline phosphatase (APISO): 74 U/L (ref 33–130)
BUN/Creatinine Ratio: 42 (calc) — ABNORMAL HIGH (ref 6–22)
BUN: 28 mg/dL — ABNORMAL HIGH (ref 7–25)
CALCIUM: 9.9 mg/dL (ref 8.6–10.4)
CO2: 30 mmol/L (ref 20–32)
CREATININE: 0.67 mg/dL (ref 0.60–0.88)
Chloride: 105 mmol/L (ref 98–110)
GFR, EST NON AFRICAN AMERICAN: 80 mL/min/{1.73_m2} (ref >=60)
GFR, Est African American: 92 mL/min/{1.73_m2} (ref >=60)
GLOBULIN: 2.8 g/dL (ref 1.9–3.7)
Glucose, Bld: 108 mg/dL — ABNORMAL HIGH (ref 65–99)
Potassium: 4 mmol/L (ref 3.5–5.3)
SODIUM: 142 mmol/L (ref 135–146)
Total Bilirubin: 0.5 mg/dL (ref 0.2–1.2)
Total Protein: 6.7 g/dL (ref 6.1–8.1)

## 2017-09-05 LAB — LIPID PANEL
CHOL/HDL RATIO: 3 (calc) (ref <5.0)
CHOLESTEROL: 162 mg/dL (ref <200)
HDL: 54 mg/dL (ref >50)
LDL Cholesterol (Calc): 93 mg/dL (calc)
Non-HDL Cholesterol (Calc): 108 mg/dL (calc) (ref <130)
Triglycerides: 67 mg/dL (ref <150)

## 2017-09-05 LAB — CBC WITH DIFFERENTIAL/PLATELET
BASOS ABS: 28 {cells}/uL (ref 0–200)
Basophils Relative: 0.6 %
EOS PCT: 1.5 %
Eosinophils Absolute: 71 cells/uL (ref 15–500)
HCT: 37.4 % (ref 35.0–45.0)
Hemoglobin: 12.9 g/dL (ref 11.7–15.5)
Lymphs Abs: 1795 cells/uL (ref 850–3900)
MCH: 33.3 pg — AB (ref 27.0–33.0)
MCHC: 34.5 g/dL (ref 32.0–36.0)
MCV: 96.6 fL (ref 80.0–100.0)
MONOS PCT: 8.7 %
MPV: 10.5 fL (ref 7.5–12.5)
NEUTROS PCT: 51 %
Neutro Abs: 2397 cells/uL (ref 1500–7800)
PLATELETS: 120 10*3/uL — AB (ref 140–400)
RBC: 3.87 10*6/uL (ref 3.80–5.10)
RDW: 12.2 % (ref 11.0–15.0)
TOTAL LYMPHOCYTE: 38.2 %
WBC mixed population: 409 cells/uL (ref 200–950)
WBC: 4.7 10*3/uL (ref 3.8–10.8)

## 2017-10-04 NOTE — Progress Notes (Signed)
Electrophysiology Office Note Date: 10/11/2017  ID:  [name], DOB Sep 06, 1928, MRN [id number]  PCP: Blanchie Serve, MD Electrophysiologist: Caryl Comes  CC: Pacemaker follow-up  [name] is a 84 y.o. female seen today for Dr Caryl Comes.  She presents today for routine electrophysiology followup.  Since last being seen in our clinic, the patient reports doing very well.  She denies chest pain, palpitations, dyspnea, PND, orthopnea, nausea, vomiting, syncope, edema, weight gain, or early satiety. She has rare dizziness.   Device History: MDT dual chamber PPM implanted 2000 for intermittent complete heart block; gen change 2009   Past Medical History:  Diagnosis Date  . Achalasia   . Anemia   . Arthritis    back  . Chronic steroid use   . Complete heart block ([place])    S/P PACEMAKER 2000 W/ GENERATOR CHANGE 2009  . Empyema, right ([place]) PULMOLOGIST-- DR CLANCE   VATS 06/23/2012 cultures negative to date CXR 07/19/12 persistent airfluid levels/  CXR 11-01-2012 IMPROVE RIGHT PLEURAL EFFUSION  . GERD (gastroesophageal reflux disease)   . History of aspiration pneumonitis    DEC 2013  . History of hiatal hernia   . Hypertension   . Inguinal hernia    right  . Intrinsic urethral sphincter deficiency   . Megaesophagus   . Mixed stress and urge urinary incontinence   . Multinodular thyroid 06/26/2012   Multi nodular goiter. Large nodules in both lobes of the gland.  These nodules fit national criteria for fine needle aspiration  biopsy if not previously assessed.    . OSA on CPAP    cpap, doees not know settings  . Polymyalgia rheumatica (HCC)    on chronic Prednisone 5mg  daily  . RBBB   . S/P dilatation of esophageal stricture    Past Surgical History:  Procedure Laterality Date  . APPENDECTOMY  1953   w/ removal benign kidney tumor   . BALLOON DILATION  07/24/2012   Procedure: BALLOON DILATION;  Surgeon: Inda Castle, MD;  Location: [facility];  Service:  Endoscopy;  Laterality: N/A;  . BALLOON DILATION N/A 12/03/2015   Procedure: BALLOON DILATION;  Surgeon: Mauri Pole, MD;  Location: [facility];  Service: Endoscopy;  Laterality: N/A;  pnuematic balloon  . BALLOON DILATION N/A 03/16/2017   Procedure: BALLOON DILATION;  Surgeon: Mauri Pole, MD;  Location: [facility];  Service: Endoscopy;  Laterality: N/A;  PNUEMATIC BALLOONS  . BOTOX INJECTION  08/07/2012   Procedure: BOTOX INJECTION;  Surgeon: Inda Castle, MD;  Location: [facility];  Service: Endoscopy;  Laterality: N/A;  . BOTOX INJECTION N/A 05/24/2013   Procedure: MACROPLASTIQUE IMPLANT;  Surgeon: Irine Seal, MD;  Location: [facility];  Service: Urology;  Laterality: N/A;  . BOTOX INJECTION  02/25/2014   Procedure: BOTOX INJECTION;  Surgeon: Inda Castle, MD;  Location: [facility];  Service: Endoscopy;;  . CARDIAC PACEMAKER PLACEMENT  06/1999  DR RUTH GREENFIELD AT [facility]  ( LAST PACER CHECK 05-09-2013) for CHB/   END-OF-LIFE GENERATOR CHANGE  2009  . CATARACT EXTRACTION W/ INTRAOCULAR LENS  IMPLANT, BILATERAL  2005  . CATARACT EXTRACTION W/ INTRAOCULAR LENS  IMPLANT, BILATERAL    . DILATION AND CURETTAGE OF UTERUS    . ESOPHAGOGASTRODUODENOSCOPY  07/24/2012   Procedure: ESOPHAGOGASTRODUODENOSCOPY (EGD);  Surgeon: Inda Castle, MD;  Location: [facility];  Service: Endoscopy;  Laterality: N/A;  . ESOPHAGOGASTRODUODENOSCOPY  08/07/2012   Procedure: ESOPHAGOGASTRODUODENOSCOPY (EGD);  Surgeon:  Inda Castle, MD;  Location: [facility];  Service: Endoscopy;  Laterality: N/A;  . ESOPHAGOGASTRODUODENOSCOPY N/A 02/25/2014   Procedure: ESOPHAGOGASTRODUODENOSCOPY (EGD);  Surgeon: Inda Castle, MD;  Location: [facility];  Service: Endoscopy;  Laterality: N/A;  . ESOPHAGOGASTRODUODENOSCOPY N/A 03/16/2017   Procedure: ESOPHAGOGASTRODUODENOSCOPY (EGD);  Surgeon: Mauri Pole, MD;  Location: [facility];  Service: Endoscopy;  Laterality:  N/A;  . ESOPHAGOGASTRODUODENOSCOPY (EGD) WITH ESOPHAGEAL DILATION  06/27/2012   Procedure: ESOPHAGOGASTRODUODENOSCOPY (EGD) WITH ESOPHAGEAL DILATION;  Surgeon: Ladene Artist, MD,FACG;  Location: [facility];  Service: Endoscopy;  Laterality: N/A;  . ESOPHAGOGASTRODUODENOSCOPY (EGD) WITH PROPOFOL N/A 10/09/2015   Procedure: ESOPHAGOGASTRODUODENOSCOPY (EGD) WITH PROPOFOL ( WITH BOTOX);  Surgeon: Milus Banister, MD;  Location: [facility];  Service: Endoscopy;  Laterality: N/A;  . ESOPHAGOGASTRODUODENOSCOPY (EGD) WITH PROPOFOL N/A 12/03/2015   Procedure: ESOPHAGOGASTRODUODENOSCOPY (EGD) WITH PROPOFOL;  Surgeon: Mauri Pole, MD;  Location: [facility];  Service: Endoscopy;  Laterality: N/A;  . JOINT REPLACEMENT     right hip, 2001  . PACEMAKER GENERATOR CHANGE  12/13/2007   at [facility]  . TOE SURGERY  2013   left 3rd toe HAMMERTOE REPAIR  . TONSILLECTOMY  AS CHILD  . TOTAL HIP ARTHROPLASTY Right 2001  . VERICOSE VEIN LIGATION    . VIDEO ASSISTED THORACOSCOPY (VATS)/DECORTICATION  06/23/2012   Procedure: VIDEO ASSISTED THORACOSCOPY (VATS)/DECORTICATION;  Surgeon: Grace Isaac, MD;  Location: [facility];  Service: Thoracic;  Laterality: Right;  Marland Kitchen VIDEO ASSISTED THORACOSCOPY (VATS)/EMPYEMA     06/23/2012  . VIDEO BRONCHOSCOPY  06/23/2012   Procedure: VIDEO BRONCHOSCOPY;  Surgeon: Grace Isaac, MD;  Location: [facility] OR;  Service: Thoracic;  Laterality: N/A;    Current Outpatient Medications  Medication Sig Dispense Refill  . acetaminophen (TYLENOL) 325 MG tablet Take 650 mg by mouth as needed for moderate pain (arthritis pain). Reported on 08/18/2015    . ascorbic acid (VITAMIN C) 500 MG tablet Take 500 mg by mouth daily. Reported on 08/26/2015    . Calcium Carbonate (CALCIUM 600 PO) Take 600 mg by mouth daily.    . Cholecalciferol (VITAMIN D-3) 1000 units CAPS Take 1,000 Units by mouth daily.     Marland Kitchen co-enzyme Q-10 30 MG capsule Take 30 mg by mouth daily.    . furosemide (LASIX) 20 MG tablet  Take 20 mg by mouth as needed (swelling).    . Multiple Vitamin (MULTIVITAMIN) tablet Take 1 tablet by mouth daily.    . Omega 3 1200 MG CAPS Take 1,200 mg by mouth at bedtime.     Marland Kitchen PREMARIN vaginal cream Apply 1 Applicatorful topically 2 (two) times a week.   0  . ranitidine (ZANTAC) 150 MG tablet Take 150 mg by mouth 2 (two) times daily. Take one tablet in morning and one in evening     No current facility-administered medications for this visit.     Allergies:   Actonel [risedronate sodium] and Ivp dye [iodinated diagnostic agents]   Social History: Social History   Socioeconomic History  . Marital status: Married    Spouse name: Not on file  . Number of children: 22  . Years of education: Not on file  . Highest education level: Not on file  Occupational History  . Occupation: Retired Regulatory affairs officer Needs  . Financial resource strain: Not on file  . Food insecurity:    Worry: Not on file    Inability: Not on file  . Transportation needs:    Medical:  Not on file    Non-medical: Not on file  Tobacco Use  . Smoking status: Passive Smoke Exposure - Never Smoker  . Smokeless tobacco: Never Used  . Tobacco comment: Exposure through father only.  Substance and Sexual Activity  . Alcohol use: Yes    Alcohol/week: 1.8 oz    Types: 3 Glasses of wine per week    Comment: 1 drink x 3 days/week  . Drug use: No  . Sexual activity: Never    Comment: married  Lifestyle  . Physical activity:    Days per week: Not on file    Minutes per session: Not on file  . Stress: Not on file  Relationships  . Social connections:    Talks on phone: Not on file    Gets together: Not on file    Attends religious service: Not on file    Active member of club or organization: Not on file    Attends meetings of clubs or organizations: Not on file    Relationship status: Not on file  . Intimate partner violence:    Fear of current or ex partner: Not on file    Emotionally  abused: Not on file    Physically abused: Not on file    Forced sexual activity: Not on file  Other Topics Concern  . Not on file  Social History Narrative   [facility] since [facility]/18/[facility]   Married   Pacemaker   POA   Never smoked   Alcohol-wine 2-3 nights a week    Exercise - exercise classes 3 days a week    [employer] at [facility]      [facility]:   Originally from [place]. Has lived in [place], [place], [place], & moved to [place] in 1961. 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 No bird exposure.     Family History: Family History  Problem Relation Age of Onset  . Arthritis Father   . Heart disease Father   . Cancer Brother        ?spine  . Heart disease Brother   . Lung cancer Brother   . Diabetes Son   . Heart disease Mother        Congestive heart failure  . Heart disease Brother   . Arthritis Brother   . Hypertension Brother   . Colon cancer Neg Hx      Review of Systems: All other systems reviewed and are otherwise negative except as noted above.   Physical Exam: VS:  BP 130/70   Pulse 87   Ht 5' (1.524 m)   Wt 146 lb (66.2 kg)   SpO2 97%   BMI 28.51 kg/m  , BMI Body mass index is 28.51 kg/m.  GEN- The patient is elderly appearing, alert and oriented x 3 today.   HEENT: normocephalic, atraumatic; sclera clear, conjunctiva pink; hearing intact; oropharynx clear; neck supple  Lungs- Clear to ausculation bilaterally, normal work of breathing.  No wheezes, rales, rhonchi Heart- Regular rate and rhythm  GI- soft, non-tender, non-distended, bowel sounds present  Extremities- no clubbing, cyanosis, or edema  MS- no significant deformity or atrophy Skin- warm and dry, no rash or lesion; PPM pocket well healed Psych- euthymic mood, full affect Neuro- strength and sensation are intact  PPM Interrogation- reviewed in detail today,  See PACEART report  EKG:  EKG is not ordered  today.  Recent Labs: 12/15/2016: TSH 0.80 09/05/2017: ALT  9; BUN 28; Creat 0.67; Hemoglobin 12.9; Platelets 120; Potassium 4.0; Sodium 142   Wt Readings from Last 3 Encounters:  10/11/17 146 lb (66.2 kg)  08/31/17 152 lb 6.4 oz (69.1 kg)  05/13/17 150 lb (68 kg)     Other studies Reviewed: Additional studies/ records that were reviewed today include: Dr Olin Pia office notes  Assessment and Plan:  1.  Symptomatic bradycardia Normal PPM function See Pace Art report No changes today  2.  Paroxysmal atrial fibrillation Burden by device interrogation 0% CHADS2VASC is at least 5 Her burden is very low - no episodes since 11/2016. We discussed today. Will consider [place] for recurrent AF.   3.  HTN Stable No change required today    Current medicines are reviewed at length with the patient today.   The patient does not have concerns regarding her medicines.  The following changes were made today:  none  Labs/ tests ordered today include: none No orders of the defined types were placed in this encounter.    Disposition:   Follow up with [facility], Dr Caryl Comes 1 year    Signed, Chanetta Marshall, NP 10/11/2017 10:41 AM  [address] [place] [address] [phone] (office) [phone] (fax)

## 2017-10-11 ENCOUNTER — Encounter: Payer: Self-pay | Admitting: Nurse Practitioner

## 2017-10-11 ENCOUNTER — Ambulatory Visit (INDEPENDENT_AMBULATORY_CARE_PROVIDER_SITE_OTHER): Payer: Medicare Other | Admitting: Nurse Practitioner

## 2017-10-11 VITALS — BP 130/70 | HR 87 | Ht 60.0 in | Wt 146.0 lb

## 2017-10-11 DIAGNOSIS — I480 Paroxysmal atrial fibrillation: Secondary | ICD-10-CM | POA: Diagnosis not present

## 2017-10-11 DIAGNOSIS — I495 Sick sinus syndrome: Secondary | ICD-10-CM

## 2017-10-11 DIAGNOSIS — I10 Essential (primary) hypertension: Secondary | ICD-10-CM

## 2017-10-11 LAB — CUP PACEART INCLINIC DEVICE CHECK
Date Time Interrogation Session: 20190402104423
Implantable Lead Implant Date: 20001227
Implantable Lead Location: 753860
Implantable Lead Model: 5076
MDC IDC LEAD IMPLANT DT: 20001227
MDC IDC LEAD LOCATION: 753859
MDC IDC PG IMPLANT DT: 20090603

## 2017-10-11 NOTE — Patient Instructions (Addendum)
Medication Instructions:   Your physician recommends that you continue on your current medications as directed. Please refer to the Current Medication list given to you today.  If you need a refill on your cardiac medications before your next appointment, please call your pharmacy.  Labwork: NONE ORDERED  TODAY    Testing/Procedures: NONE ORDERED  TODAY    Follow-Up:    Your physician wants you to follow-up in: [place] will receive a reminder letter in the mail two months in advance. If you don't receive a letter, please call our office to schedule the follow-up appointment.  Remote monitoring is used to monitor your Pacemaker of ICD from home. This monitoring reduces the number of office visits required to check your device to one time per year. It allows [place] to keep an eye on the functioning of your device to ensure it is working properly. You are scheduled for a device check from home on . 10-24-17 You may send your transmission at any time that day. If you have a wireless device, the transmission will be sent automatically. After your physician reviews your transmission, you will receive a postcard with your next transmission date.     Any Other Special Instructions Will Be Listed Below (If Applicable).

## 2017-10-14 ENCOUNTER — Encounter: Payer: Medicare Other | Admitting: Nurse Practitioner

## 2017-10-24 ENCOUNTER — Telehealth: Payer: Self-pay | Admitting: Cardiology

## 2017-10-24 ENCOUNTER — Ambulatory Visit (INDEPENDENT_AMBULATORY_CARE_PROVIDER_SITE_OTHER): Payer: Medicare Other | Admitting: *Deleted

## 2017-10-24 DIAGNOSIS — I495 Sick sinus syndrome: Secondary | ICD-10-CM | POA: Diagnosis not present

## 2017-10-24 NOTE — Telephone Encounter (Signed)
Spoke with pt and reminded pt of remote transmission that is due today. Pt verbalized understanding.   

## 2017-10-24 NOTE — Progress Notes (Signed)
Remote pacemaker transmission.   

## 2017-10-25 ENCOUNTER — Emergency Department (HOSPITAL_COMMUNITY)
Admission: EM | Admit: 2017-10-25 | Discharge: 2017-10-25 | Disposition: A | Discharge status: Home / Self Care | Payer: Medicare Other | Attending: Emergency Medicine | Admitting: Emergency Medicine

## 2017-10-25 ENCOUNTER — Other Ambulatory Visit: Payer: Self-pay

## 2017-10-25 ENCOUNTER — Encounter (HOSPITAL_COMMUNITY): Payer: Self-pay

## 2017-10-25 ENCOUNTER — Emergency Department (HOSPITAL_COMMUNITY): Payer: Medicare Other

## 2017-10-25 DIAGNOSIS — W1830XA Fall on same level, unspecified, initial encounter: Secondary | ICD-10-CM | POA: Insufficient documentation

## 2017-10-25 DIAGNOSIS — S4991XA Unspecified injury of right shoulder and upper arm, initial encounter: Secondary | ICD-10-CM | POA: Diagnosis present

## 2017-10-25 DIAGNOSIS — Y929 Unspecified place or not applicable: Secondary | ICD-10-CM | POA: Diagnosis not present

## 2017-10-25 DIAGNOSIS — S42201A Unspecified fracture of upper end of right humerus, initial encounter for closed fracture: Secondary | ICD-10-CM | POA: Diagnosis not present

## 2017-10-25 DIAGNOSIS — S43004A Unspecified dislocation of right shoulder joint, initial encounter: Secondary | ICD-10-CM | POA: Diagnosis not present

## 2017-10-25 DIAGNOSIS — Y939 Activity, unspecified: Secondary | ICD-10-CM | POA: Insufficient documentation

## 2017-10-25 DIAGNOSIS — S43001A Unspecified subluxation of right shoulder joint, initial encounter: Secondary | ICD-10-CM | POA: Insufficient documentation

## 2017-10-25 DIAGNOSIS — Z95811 Presence of heart assist device: Secondary | ICD-10-CM | POA: Insufficient documentation

## 2017-10-25 DIAGNOSIS — S42291A Other displaced fracture of upper end of right humerus, initial encounter for closed fracture: Secondary | ICD-10-CM | POA: Diagnosis not present

## 2017-10-25 DIAGNOSIS — Z79899 Other long term (current) drug therapy: Secondary | ICD-10-CM | POA: Insufficient documentation

## 2017-10-25 DIAGNOSIS — Y999 Unspecified external cause status: Secondary | ICD-10-CM | POA: Diagnosis not present

## 2017-10-25 DIAGNOSIS — S4290XA Fracture of unspecified shoulder girdle, part unspecified, initial encounter for closed fracture: Secondary | ICD-10-CM | POA: Diagnosis not present

## 2017-10-25 DIAGNOSIS — S43014A Anterior dislocation of right humerus, initial encounter: Secondary | ICD-10-CM | POA: Diagnosis not present

## 2017-10-25 MED ORDER — TRAMADOL HCL 50 MG PO TABS: 50.0000 mg | ORAL_TABLET | Freq: Four times a day (QID) | ORAL | 0 refills | Status: DC | PRN

## 2017-10-25 NOTE — ED Triage Notes (Addendum)
Patient states she tripped on a box today and landed on her right shoulder. Patient did not hit her head or have LOC. Patient does not take anticoagulants.

## 2017-10-25 NOTE — ED Provider Notes (Signed)
[facility] DEPT Provider Note   CSN: [id number] Arrival date & time: 10/25/17  1646     History   Chief Complaint Chief Complaint  Patient presents with  . Fall  . Shoulder Pain    right    HPI [name] is a 84 y.o. female.  HPI  5/10 pain right shoulder after fall, is severe with pain Tripped and fell over something on the floor around 3PM directly onto right shoulder Took 2 ibuprofen Did not hit head, no headache, no neck pain, no numbness or weakness, no back pain, no chest pain, no abdominal pain.  Ambulatory after fall. Not on anticoagulation. Initially reports no numbness but then notes she does have some hand tingling sensation throughout whole hand although sensation feels normal when she touches it.    Past Medical History:  Diagnosis Date  . Achalasia   . Anemia   . Arthritis    back  . Chronic steroid use   . Complete heart block ([place])    S/P PACEMAKER 2000 W/ GENERATOR CHANGE 2009  . Empyema, right ([place]) PULMOLOGIST-- DR CLANCE   VATS 06/23/2012 cultures negative to date CXR 07/19/12 persistent airfluid levels/  CXR 11-01-2012 IMPROVE RIGHT PLEURAL EFFUSION  . GERD (gastroesophageal reflux disease)   . History of aspiration pneumonitis    DEC 2013  . History of hiatal hernia   . Hypertension   . Inguinal hernia    right  . Intrinsic urethral sphincter deficiency   . Megaesophagus   . Mixed stress and urge urinary incontinence   . Multinodular thyroid 06/26/2012   Multi nodular goiter. Large nodules in both lobes of the gland.  These nodules fit national criteria for fine needle aspiration  biopsy if not previously assessed.    . OSA on CPAP    cpap, doees not know settings  . Polymyalgia rheumatica (HCC)    on chronic Prednisone 5mg  daily  . RBBB   . S/P dilatation of esophageal stricture     Patient Active Problem List   Diagnosis Date Noted  . Age-related osteoporosis without current pathological  fracture 08/31/2017  . Overweight 08/31/2017  . Callus 08/31/2017  . S/P placement of cardiac pacemaker 04/27/2017  . Hyperlipidemia 04/27/2017  . Atrophic vaginitis 04/27/2017  . Estrogen deficiency 04/27/2017  . Bezoar   . Actinic keratoses 07/06/2016  . Achalasia   . Wound, open, finger 11/25/2015  . 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 Abnormal chest x-ray 08/18/2015  . Right inguinal hernia 04/08/2015  . Pain of right scapula 04/02/2014  . Osteopenia 11/13/2013  . Urinary incontinence 11/13/2013  . Hyperglycemia 07/17/2013  . Intrinsic sphincter deficiency 05/24/2013  . Mixed incontinence urge and stress 05/24/2013  . Reflux esophagitis 01/28/2013  . Sinoatrial node dysfunction ([place]) 01/05/2013  . Sick sinus syndrome ([place]) 01/05/2013  . Sleep apnea 11/21/2012  . Disorder of bone and cartilage 11/21/2012  . Obesity 11/21/2012  . Edema 11/21/2012  . Personal history of other diseases of circulatory system 11/21/2012  . Achalasia of esophagus 10/10/2012  . Gastric AVM 08/07/2012  . Esophageal dysphagia 06/27/2012  . Multinodular thyroid 06/26/2012  . Megaesophagus 06/20/2012  . Polymyalgia rheumatica ([place]) 06/19/2012  . Mediastinal mass 06/19/2012  . Hammer toe 04/17/2012  . Long term current use of systemic steroids 01/27/2012  . Paroxysmal ventricular tachycardia ([place]) 01/18/2012  . Cardiac pacemaker in situ 01/18/2012  . Non-sustained ventricular tachycardia ([place]) 01/18/2012  . Nonsustained ventricular tachycardia ([place]) 01/18/2012  . Sprain  of wrist 05/10/2011  . Disorder of rotator cuff syndrome of shoulder and allied disorder 04/01/2011  . Bursitis of shoulder 04/01/2011    Past Surgical History:  Procedure Laterality Date  . APPENDECTOMY  1953   w/ removal benign kidney tumor   . BALLOON DILATION  07/24/2012   Procedure: BALLOON DILATION;  Surgeon: Inda Castle, MD;  Location: [facility];  Service: Endoscopy;  Laterality: N/A;  . BALLOON DILATION N/A  12/03/2015   Procedure: BALLOON DILATION;  Surgeon: Mauri Pole, MD;  Location: [facility];  Service: Endoscopy;  Laterality: N/A;  pnuematic balloon  . BALLOON DILATION N/A 03/16/2017   Procedure: BALLOON DILATION;  Surgeon: Mauri Pole, MD;  Location: [facility];  Service: Endoscopy;  Laterality: N/A;  PNUEMATIC BALLOONS  . BOTOX INJECTION  08/07/2012   Procedure: BOTOX INJECTION;  Surgeon: Inda Castle, MD;  Location: [facility];  Service: Endoscopy;  Laterality: N/A;  . BOTOX INJECTION N/A 05/24/2013   Procedure: MACROPLASTIQUE IMPLANT;  Surgeon: Irine Seal, MD;  Location: [facility];  Service: Urology;  Laterality: N/A;  . BOTOX INJECTION  02/25/2014   Procedure: BOTOX INJECTION;  Surgeon: Inda Castle, MD;  Location: [facility];  Service: Endoscopy;;  . CARDIAC PACEMAKER PLACEMENT  06/1999  DR RUTH GREENFIELD AT [facility]  ( LAST PACER CHECK 05-09-2013) for CHB/   END-OF-LIFE GENERATOR CHANGE  2009  . CATARACT EXTRACTION W/ INTRAOCULAR LENS  IMPLANT, BILATERAL  2005  . CATARACT EXTRACTION W/ INTRAOCULAR LENS  IMPLANT, BILATERAL    . DILATION AND CURETTAGE OF UTERUS    . ESOPHAGOGASTRODUODENOSCOPY  07/24/2012   Procedure: ESOPHAGOGASTRODUODENOSCOPY (EGD);  Surgeon: Inda Castle, MD;  Location: [facility];  Service: Endoscopy;  Laterality: N/A;  . ESOPHAGOGASTRODUODENOSCOPY  08/07/2012   Procedure: ESOPHAGOGASTRODUODENOSCOPY (EGD);  Surgeon: Inda Castle, MD;  Location: [facility];  Service: Endoscopy;  Laterality: N/A;  . ESOPHAGOGASTRODUODENOSCOPY N/A 02/25/2014   Procedure: ESOPHAGOGASTRODUODENOSCOPY (EGD);  Surgeon: Inda Castle, MD;  Location: [facility];  Service: Endoscopy;  Laterality: N/A;  . ESOPHAGOGASTRODUODENOSCOPY N/A 03/16/2017   Procedure: ESOPHAGOGASTRODUODENOSCOPY (EGD);  Surgeon: Mauri Pole, MD;  Location: [facility]COPY;  Service: Endoscopy;  Laterality: N/A;  . ESOPHAGOGASTRODUODENOSCOPY (EGD) WITH  ESOPHAGEAL DILATION  06/27/2012   Procedure: ESOPHAGOGASTRODUODENOSCOPY (EGD) WITH ESOPHAGEAL DILATION;  Surgeon: Ladene Artist, MD,FACG;  Location: [facility];  Service: Endoscopy;  Laterality: N/A;  . ESOPHAGOGASTRODUODENOSCOPY (EGD) WITH PROPOFOL N/A 10/09/2015   Procedure: ESOPHAGOGASTRODUODENOSCOPY (EGD) WITH PROPOFOL ( WITH BOTOX);  Surgeon: Milus Banister, MD;  Location: [facility];  Service: Endoscopy;  Laterality: N/A;  . ESOPHAGOGASTRODUODENOSCOPY (EGD) WITH PROPOFOL N/A 12/03/2015   Procedure: ESOPHAGOGASTRODUODENOSCOPY (EGD) WITH PROPOFOL;  Surgeon: Mauri Pole, MD;  Location: [facility] ENDOSCOPY;  Service: Endoscopy;  Laterality: N/A;  . JOINT REPLACEMENT     right hip, 2001  . PACEMAKER GENERATOR CHANGE  12/13/2007   at [facility]  . TOE SURGERY  2013   left 3rd toe HAMMERTOE REPAIR  . TONSILLECTOMY  AS CHILD  . TOTAL HIP ARTHROPLASTY Right 2001  . VERICOSE VEIN LIGATION    . VIDEO ASSISTED THORACOSCOPY (VATS)/DECORTICATION  06/23/2012   Procedure: VIDEO ASSISTED THORACOSCOPY (VATS)/DECORTICATION;  Surgeon: Grace Isaac, MD;  Location: [facility];  Service: Thoracic;  Laterality: Right;  Marland Kitchen VIDEO ASSISTED THORACOSCOPY (VATS)/EMPYEMA     06/23/2012  . VIDEO BRONCHOSCOPY  06/23/2012   Procedure: VIDEO BRONCHOSCOPY;  Surgeon: Grace Isaac, MD;  Location: [facility];  Service:  Thoracic;  Laterality: N/A;     OB History   None      Home Medications    Prior to Admission medications   Medication Sig Start Date End Date Taking? Authorizing Provider  ascorbic acid (VITAMIN C) 500 MG tablet Take 500 mg by mouth daily. Reported on 08/26/2015   Yes [provider]  Calcium Carbonate (CALCIUM 600 PO) Take 600 mg by mouth daily.   Yes [provider]  Cholecalciferol (VITAMIN D-3) 1000 units CAPS Take 1,000 Units by mouth daily.    Yes [provider]  co-enzyme Q-10 30 MG capsule Take 30 mg by mouth daily.   Yes [provider]  ibuprofen  (ADVIL,MOTRIN) 200 MG tablet Take 400 mg by mouth every 6 (six) hours as needed for moderate pain.   Yes [provider]  Omega 3 1200 MG CAPS Take 1,200 mg by mouth at bedtime.    Yes [provider]  PREMARIN vaginal cream Apply 1 Applicatorful topically 2 (two) times a week.  03/07/17  Yes [provider]  ranitidine (ZANTAC) 150 MG tablet Take 150 mg by mouth 2 (two) times daily. Take one tablet in morning and one in evening   Yes [provider]  acetaminophen (TYLENOL) 325 MG tablet Take 650 mg by mouth as needed for moderate pain (arthritis pain). Reported on 08/18/2015    [provider]  furosemide (LASIX) 20 MG tablet Take 20 mg by mouth as needed (swelling).    [provider]  traMADol (ULTRAM) 50 MG tablet Take 1 tablet (50 mg total) by mouth every 6 (six) hours as needed. 10/25/17   Gareth Morgan, MD    Family History Family History  Problem Relation Age of Onset  . Arthritis Father   . Heart disease Father   . Cancer Brother        ?spine  . Heart disease Brother   . Lung cancer Brother   . Diabetes Son   . Heart disease Mother        Congestive heart failure  . Heart disease Brother   . Arthritis Brother   . Hypertension Brother   . Colon cancer Neg Hx     Social History Social History   Tobacco Use  . Smoking status: Passive Smoke Exposure - Never Smoker  . Smokeless tobacco: Never Used  . Tobacco comment: Exposure through father only.  Substance Use Topics  . Alcohol use: Yes    Alcohol/week: 1.8 oz    Types: 3 Glasses of wine per week    Comment: 1 drink x 3 days/week  . Drug use: No     Allergies   Actonel [risedronate sodium] and Ivp dye [iodinated diagnostic agents]   Review of Systems Review of Systems  Constitutional: Negative for fever.  HENT: Negative for sore throat.   Eyes: Negative for visual disturbance.  Respiratory: Negative for cough and shortness of breath.   Cardiovascular:  Negative for chest pain.  Gastrointestinal: Negative for abdominal pain.  Genitourinary: Negative for difficulty urinating.  Musculoskeletal: Positive for arthralgias. Negative for back pain and neck pain.  Skin: Negative for rash.  Neurological: Negative for syncope and headaches.     Physical Exam Updated Vital Signs BP 138/83 (BP Location: Left Arm)   Pulse 94   Temp 97.8 F (36.6 C) (Oral)   Resp 16   Ht 5' (1.524 m)   Wt 65.8 kg (145 lb)   SpO2 94%   BMI 28.32 kg/m  Physical Exam  Constitutional: She is oriented to person, place, and time. She appears well-developed and well-nourished. No distress.  HENT:  Head: Normocephalic and atraumatic.  Eyes: Conjunctivae and EOM are normal.  Neck: Normal range of motion.  Cardiovascular: Normal rate, regular rhythm, normal heart sounds and intact distal pulses. Exam reveals no gallop and no friction rub.  No murmur heard. Pulmonary/Chest: Effort normal. No respiratory distress.  Musculoskeletal: She exhibits no edema or tenderness.       Right shoulder: She exhibits decreased range of motion, deformity and pain. She exhibits normal pulse.       Right elbow: She exhibits normal range of motion and no swelling.       Right wrist: She exhibits normal range of motion, no tenderness, no bony tenderness, no swelling, no effusion and no deformity.       Cervical back: She exhibits no bony tenderness.       Thoracic back: She exhibits no bony tenderness.       Lumbar back: She exhibits no bony tenderness.  Normal strength grip, finger abduction, opponens, wrist abduction, elbow flexion/extension Reports normal sensation in all distributions of UE  Neurological: She is alert and oriented to person, place, and time.  Skin: Skin is warm and dry. No rash noted. She is not diaphoretic. No erythema.  Nursing note and vitals reviewed.    ED Treatments / Results  Labs (all labs ordered are listed, but only abnormal results are  displayed) Labs Reviewed - No data to display  EKG None  Radiology Dg Shoulder 1 View Right  Result Date: 10/25/2017 CLINICAL DATA:  Fall EXAM: RIGHT SHOULDER - 1 VIEW COMPARISON:  Study obtained earlier in the day FINDINGS: Scapular Y-view currently shows anterior dislocation. No fracture evident on this single view. IMPRESSION: Anterior dislocation.  No fracture demonstrable on this single view. Electronically Signed   By: Lowella Grip III M.D.   On: 10/25/2017 18:45   Dg Shoulder Right  Result Date: 10/25/2017 CLINICAL DATA:  Pain following fall EXAM: RIGHT SHOULDER - 2+ VIEW COMPARISON:  None. FINDINGS: Frontal, oblique, and Y scapular images were obtained. There is anterior subluxation of the right humerus with respect to the glenoid. There is no frank dislocation. There is a fracture along the superior aspect of the proximal most aspect of the humerus with alignment near anatomic. There is also suspicion for impaction at the junction of the epiphysis and metaphysis of the proximal humerus. No other fractures are evident. There is mild narrowing of the acromioclavicular joint. No erosive change. Joint. IMPRESSION: 1. Anterior subluxation of the humeral head with respect to the glenoid. There is no frank dislocation. 2. Fracture along the superior aspect of the humeral head with alignment near anatomic. 3. Suspect impaction injury at the junction of the epiphysis and metaphysis of the proximal humerus. 4.  Narrowing acromioclavicular joint on the right. Electronically Signed   By: Lowella Grip III M.D.   On: 10/25/2017 17:29    Procedures Procedures (including critical care time)  Medications Ordered in ED Medications - No data to display   Initial Impression / Assessment and Plan / ED Course  I have reviewed the triage vital signs and the nursing notes.  Pertinent labs & imaging results that were available during my care of the patient were reviewed by me and considered in my  medical decision making (see chart for details).     84yo female with history above presents with concern for mechanical fall  from standing onto her right shoulder with right shoulder pain.  Denies headache, head trauma, is not on anticoagulation, have low suspicion at this time for intracranial bleed.  No cervical spine tenderness, no neck pain, and have low suspicion for cervical spine fracture or other injuries.  She is neurovascularly intact on exam.  X-ray reviewed by myself and radiology, and shows subluxation of shoulder and proximal humerus fracture.  Discussed with Dr. to of orthopedics, who also reviewed the images, and recommended scapular Y-view.  Scapular Y-view was obtained, with radiology suspecting dislocation, however Dr. to reviewing imaging and feeling is more consistent with translation related to hemarthrosis from her injury.  Given presence of fracture and subluxation, he is recommending close follow-up in his office in 1-2 days.  Discussed this with patient, and recommended patient maintain sling and be nonweightbearing, and follow-up with Dr. to.  She is given a prescription for tramadol, recommended to take ibuprofen and Tylenol. Patient discharged in stable condition with understanding of reasons to return.   Final Clinical Impressions(s) / ED Diagnoses   Final diagnoses:  Acquired subluxation of right shoulder, initial encounter  Closed fracture of proximal end of right humerus, unspecified fracture morphology, initial encounter    ED Discharge Orders        Ordered    traMADol (ULTRAM) 50 MG tablet  Every 6 hours PRN     10/25/17 1921       Gareth Morgan, MD 10/25/17 1936

## 2017-10-26 ENCOUNTER — Encounter: Payer: Self-pay | Admitting: Cardiology

## 2017-10-26 LAB — CUP PACEART REMOTE DEVICE CHECK
Battery Impedance: 2865 Ohm
Brady Statistic AP VS Percent: 16 %
Brady Statistic AS VS Percent: 83 %
Date Time Interrogation Session: 20190415192738
Implantable Lead Implant Date: 20001227
Implantable Lead Location: 753860
Implantable Lead Model: 5076
Implantable Lead Model: 5076
Implantable Pulse Generator Implant Date: 20090603
Lead Channel Impedance Value: 428 Ohm
Lead Channel Pacing Threshold Amplitude: 0.625 V
Lead Channel Pacing Threshold Pulse Width: 0.4 ms
Lead Channel Sensing Intrinsic Amplitude: 0.7 mV
Lead Channel Sensing Intrinsic Amplitude: 8 mV
Lead Channel Setting Pacing Amplitude: 2 V
Lead Channel Setting Pacing Pulse Width: 0.4 ms
Lead Channel Setting Sensing Sensitivity: 4 mV
MDC IDC LEAD IMPLANT DT: 20001227
MDC IDC LEAD LOCATION: 753859
MDC IDC MSMT BATTERY REMAINING LONGEVITY: 24 mo
MDC IDC MSMT BATTERY VOLTAGE: 2.73 V
MDC IDC MSMT LEADCHNL RA PACING THRESHOLD AMPLITUDE: 0.5 V
MDC IDC MSMT LEADCHNL RA PACING THRESHOLD PULSEWIDTH: 0.4 ms
MDC IDC MSMT LEADCHNL RV IMPEDANCE VALUE: 648 Ohm
MDC IDC SET LEADCHNL RV PACING AMPLITUDE: 2.5 V
MDC IDC STAT BRADY AP VP PERCENT: 2 %
MDC IDC STAT BRADY AS VP PERCENT: 0 %

## 2017-10-27 ENCOUNTER — Encounter (INDEPENDENT_AMBULATORY_CARE_PROVIDER_SITE_OTHER): Payer: Self-pay | Admitting: Orthopaedic Surgery

## 2017-10-27 ENCOUNTER — Ambulatory Visit (INDEPENDENT_AMBULATORY_CARE_PROVIDER_SITE_OTHER): Payer: Medicare Other

## 2017-10-27 ENCOUNTER — Ambulatory Visit (INDEPENDENT_AMBULATORY_CARE_PROVIDER_SITE_OTHER): Payer: Medicare Other | Admitting: Physician Assistant

## 2017-10-27 ENCOUNTER — Ambulatory Visit (INDEPENDENT_AMBULATORY_CARE_PROVIDER_SITE_OTHER): Payer: Medicare Other | Admitting: Orthopaedic Surgery

## 2017-10-27 DIAGNOSIS — S42291A Other displaced fracture of upper end of right humerus, initial encounter for closed fracture: Secondary | ICD-10-CM

## 2017-10-27 NOTE — Progress Notes (Signed)
Office Visit Note   Patient: [name]           Date of Birth: 12-Oct-1928           MRN: [id number] Visit Date: 10/27/2017              Requested by: Blanchie Serve, MD [address] [place], [place] [address] PCP: Blanchie Serve, MD   Assessment & Plan: Visit Diagnoses:  1. Fracture of humeral head, closed, right, initial encounter     Plan: After obtaining Grashey and axillary x-rays of her right shoulder dislocation was ruled out.  Patient has an minimally impacted humeral head fracture that is amenable to nonoperative treatment.  These findings were discussed with the patient.  Continue sling immobilization for the next 2 weeks.  Follow-up at that time with Grashey and scapular Y x-rays of the right shoulder.  Anticipate beginning physical therapy for gentle range of motion at that time.  Patient in agreement with the plan.  Follow-Up Instructions: Return in about 2 weeks (around 11/10/2017).   Orders:  Orders Placed This Encounter  Procedures  . XR Shoulder Right   No orders of the defined types were placed in this encounter.     Procedures: No procedures performed   Clinical Data: No additional findings.   Subjective: Chief Complaint  Patient presents with  . Right Shoulder - Pain    HPI patient is a pleasant 84 year old female who presents to our clinic today for ED follow-up of the right shoulder.  On 10/25/2017, she sustained a mechanical fall landing on the right side.  She was seen at [facility] ED where x-rays were obtained.  This showed a proximal humerus fracture.  She was placed in a sling nonweightbearing.  She was given tramadol for pain which is helping.  She comes in today for follow-up.   she has been compliant in the sling nonweightbearing.  She does note occasional activity with the right hand to include cooking as she is right-handed.  This is the only time that she does exhibit some pain.  No numbness tingling burning.  She does note some  swelling to the hand but this is not significant.   Review of Systems  Constitutional: Negative.   HENT: Negative.   Eyes: Negative.   Respiratory: Negative.   Cardiovascular: Negative.   Endocrine: Negative.   Musculoskeletal: Negative.   Neurological: Negative.   Hematological: Negative.   Psychiatric/Behavioral: Negative.   All other systems reviewed and are negative.    Objective: Vital Signs: There were no vitals taken for this visit.  Physical Exam well-developed well-nourished female in no acute distress.  Alert and oriented x3.  Ortho Exam Examination of the right shoulder reveals moderate tenderness to the proximal humerus.  Minimal ecchymosis.  She does have some swelling to the hands.  She is neurovascularly intact distally   Specialty Comments:  No specialty comments available.  Imaging: No results found.   PMFS History: Patient Active Problem List   Diagnosis Date Noted  . Age-related osteoporosis without current pathological fracture 08/31/2017  . Overweight 08/31/2017  . Callus 08/31/2017  . S/P placement of cardiac pacemaker 04/27/2017  . Hyperlipidemia 04/27/2017  . Atrophic vaginitis 04/27/2017  . Estrogen deficiency 04/27/2017  . Bezoar   . Actinic keratoses 07/06/2016  . Achalasia   . Wound, open, finger 11/25/2015  . 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 Abnormal chest x-ray 08/18/2015  . Right inguinal hernia 04/08/2015  . Pain of right  scapula 04/02/2014  . Osteopenia 11/13/2013  . Urinary incontinence 11/13/2013  . Hyperglycemia 07/17/2013  . Intrinsic sphincter deficiency 05/24/2013  . Mixed incontinence urge and stress 05/24/2013  . Reflux esophagitis 01/28/2013  . Sinoatrial node dysfunction ([place]) 01/05/2013  . Sick sinus syndrome ([place]) 01/05/2013  . Sleep apnea 11/21/2012  . Disorder of bone and cartilage 11/21/2012  . Obesity 11/21/2012  . Edema 11/21/2012  . Personal history of other diseases of circulatory system 11/21/2012  .  Achalasia of esophagus 10/10/2012  . Gastric AVM 08/07/2012  . Esophageal dysphagia 06/27/2012  . Multinodular thyroid 06/26/2012  . Megaesophagus 06/20/2012  . Polymyalgia rheumatica ([place]) 06/19/2012  . Mediastinal mass 06/19/2012  . Hammer toe 04/17/2012  . Long term current use of systemic steroids 01/27/2012  . Paroxysmal ventricular tachycardia ([place]) 01/18/2012  . Cardiac pacemaker in situ 01/18/2012  . Non-sustained ventricular tachycardia ([place]) 01/18/2012  . Nonsustained ventricular tachycardia ([place]) 01/18/2012  . Sprain of wrist 05/10/2011  . Disorder of rotator cuff syndrome of shoulder and allied disorder 04/01/2011  . Bursitis of shoulder 04/01/2011   Past Medical History:  Diagnosis Date  . Achalasia   . Anemia   . Arthritis    back  . Chronic steroid use   . Complete heart block ([place])    S/P PACEMAKER 2000 W/ GENERATOR CHANGE 2009  . Empyema, right ([place]) PULMOLOGIST-- DR CLANCE   VATS 06/23/2012 cultures negative to date CXR 07/19/12 persistent airfluid levels/  CXR 11-01-2012 IMPROVE RIGHT PLEURAL EFFUSION  . GERD (gastroesophageal reflux disease)   . History of aspiration pneumonitis    DEC 2013  . History of hiatal hernia   . Hypertension   . Inguinal hernia    right  . Intrinsic urethral sphincter deficiency   . Megaesophagus   . Mixed stress and urge urinary incontinence   . Multinodular thyroid 06/26/2012   Multi nodular goiter. Large nodules in both lobes of the gland.  These nodules fit national criteria for fine needle aspiration  biopsy if not previously assessed.    . OSA on CPAP    cpap, doees not know settings  . Polymyalgia rheumatica (HCC)    on chronic Prednisone 5mg  daily  . RBBB   . S/P dilatation of esophageal stricture     Family History  Problem Relation Age of Onset  . Arthritis Father   . Heart disease Father   . Cancer Brother        ?spine  . Heart disease Brother   . Lung cancer Brother   . Diabetes Son   . Heart disease  Mother        Congestive heart failure  . Heart disease Brother   . Arthritis Brother   . Hypertension Brother   . Colon cancer Neg Hx     Past Surgical History:  Procedure Laterality Date  . APPENDECTOMY  1953   w/ removal benign kidney tumor   . BALLOON DILATION  07/24/2012   Procedure: BALLOON DILATION;  Surgeon: Inda Castle, MD;  Location: [facility];  Service: Endoscopy;  Laterality: N/A;  . BALLOON DILATION N/A 12/03/2015   Procedure: BALLOON DILATION;  Surgeon: Mauri Pole, MD;  Location: [facility];  Service: Endoscopy;  Laterality: N/A;  pnuematic balloon  . BALLOON DILATION N/A 03/16/2017   Procedure: BALLOON DILATION;  Surgeon: Mauri Pole, MD;  Location: [facility];  Service: Endoscopy;  Laterality: N/A;  PNUEMATIC BALLOONS  . BOTOX INJECTION  08/07/2012   Procedure: BOTOX INJECTION;  Surgeon: Inda Castle, MD;  Location: [facility];  Service: Endoscopy;  Laterality: N/A;  . BOTOX INJECTION N/A 05/24/2013   Procedure: MACROPLASTIQUE IMPLANT;  Surgeon: Irine Seal, MD;  Location: [facility];  Service: Urology;  Laterality: N/A;  . BOTOX INJECTION  02/25/2014   Procedure: BOTOX INJECTION;  Surgeon: Inda Castle, MD;  Location: [facility];  Service: Endoscopy;;  . CARDIAC PACEMAKER PLACEMENT  06/1999  DR RUTH GREENFIELD AT [place]  ( LAST PACER CHECK 05-09-2013) for CHB/   END-OF-LIFE GENERATOR CHANGE  2009  . CATARACT EXTRACTION W/ INTRAOCULAR LENS  IMPLANT, BILATERAL  2005  . CATARACT EXTRACTION W/ INTRAOCULAR LENS  IMPLANT, BILATERAL    . DILATION AND CURETTAGE OF UTERUS    . ESOPHAGOGASTRODUODENOSCOPY  07/24/2012   Procedure: ESOPHAGOGASTRODUODENOSCOPY (EGD);  Surgeon: Inda Castle, MD;  Location: [facility];  Service: Endoscopy;  Laterality: N/A;  . ESOPHAGOGASTRODUODENOSCOPY  08/07/2012   Procedure: ESOPHAGOGASTRODUODENOSCOPY (EGD);  Surgeon: Inda Castle, MD;  Location: [facility];  Service: Endoscopy;   Laterality: N/A;  . ESOPHAGOGASTRODUODENOSCOPY N/A 02/25/2014   Procedure: ESOPHAGOGASTRODUODENOSCOPY (EGD);  Surgeon: Inda Castle, MD;  Location: [facility];  Service: Endoscopy;  Laterality: N/A;  . ESOPHAGOGASTRODUODENOSCOPY N/A 03/16/2017   Procedure: ESOPHAGOGASTRODUODENOSCOPY (EGD);  Surgeon: Mauri Pole, MD;  Location: [facility];  Service: Endoscopy;  Laterality: N/A;  . ESOPHAGOGASTRODUODENOSCOPY (EGD) WITH ESOPHAGEAL DILATION  06/27/2012   Procedure: ESOPHAGOGASTRODUODENOSCOPY (EGD) WITH ESOPHAGEAL DILATION;  Surgeon: Ladene Artist, MD,FACG;  Location: [facility];  Service: Endoscopy;  Laterality: N/A;  . ESOPHAGOGASTRODUODENOSCOPY (EGD) WITH PROPOFOL N/A 10/09/2015   Procedure: ESOPHAGOGASTRODUODENOSCOPY (EGD) WITH PROPOFOL ( WITH BOTOX);  Surgeon: Milus Banister, MD;  Location: [facility];  Service: Endoscopy;  Laterality: N/A;  . ESOPHAGOGASTRODUODENOSCOPY (EGD) WITH PROPOFOL N/A 12/03/2015   Procedure: ESOPHAGOGASTRODUODENOSCOPY (EGD) WITH PROPOFOL;  Surgeon: Mauri Pole, MD;  Location: [facility];  Service: Endoscopy;  Laterality: N/A;  . JOINT REPLACEMENT     right hip, 2001  . PACEMAKER GENERATOR CHANGE  12/13/2007   at [facility]  . TOE SURGERY  2013   left 3rd toe HAMMERTOE REPAIR  . TONSILLECTOMY  AS CHILD  . TOTAL HIP ARTHROPLASTY Right 2001  . VERICOSE VEIN LIGATION    . VIDEO ASSISTED THORACOSCOPY (VATS)/DECORTICATION  06/23/2012   Procedure: VIDEO ASSISTED THORACOSCOPY (VATS)/DECORTICATION;  Surgeon: Grace Isaac, MD;  Location: [facility];  Service: Thoracic;  Laterality: Right;  Marland Kitchen VIDEO ASSISTED THORACOSCOPY (VATS)/EMPYEMA     06/23/2012  . VIDEO BRONCHOSCOPY  06/23/2012   Procedure: VIDEO BRONCHOSCOPY;  Surgeon: Grace Isaac, MD;  Location: [facility];  Service: Thoracic;  Laterality: N/A;   Social History   Occupational History  . Occupation: Retired Surveyor, quantity  Tobacco Use  . Smoking status: Passive Smoke Exposure - Never  Smoker  . Smokeless tobacco: Never Used  . Tobacco comment: Exposure through father only.  Substance and Sexual Activity  . Alcohol use: Yes    Alcohol/week: 1.8 oz    Types: 3 Glasses of wine per week    Comment: 1 drink x 3 days/week  . Drug use: No  . Sexual activity: Never    Comment: married

## 2017-11-10 ENCOUNTER — Encounter (INDEPENDENT_AMBULATORY_CARE_PROVIDER_SITE_OTHER): Payer: Self-pay | Admitting: Orthopaedic Surgery

## 2017-11-10 ENCOUNTER — Ambulatory Visit (INDEPENDENT_AMBULATORY_CARE_PROVIDER_SITE_OTHER): Payer: Medicare Other | Admitting: Orthopaedic Surgery

## 2017-11-10 ENCOUNTER — Ambulatory Visit (INDEPENDENT_AMBULATORY_CARE_PROVIDER_SITE_OTHER): Payer: Medicare Other

## 2017-11-10 DIAGNOSIS — S42291A Other displaced fracture of upper end of right humerus, initial encounter for closed fracture: Secondary | ICD-10-CM | POA: Diagnosis not present

## 2017-11-10 NOTE — Progress Notes (Signed)
Office Visit Note   Patient: [name]           Date of Birth: 19-Nov-1928           MRN: [id number] Visit Date: 11/10/2017              Requested by: Blanchie Serve, MD [address] [place],  [address] PCP: Blanchie Serve, MD   Assessment & Plan: Visit Diagnoses:  1. Fracture of humeral head, closed, right, initial encounter     Plan: Patient's fracture is stable.  At this point may begin gentle range of motion with physical therapy which she has access to at friend's home [place].  I will see her back in 4 weeks with 2 view x-rays of the right shoulder.  Follow-Up Instructions: Return in about 1 month (around 12/08/2017).   Orders:  Orders Placed This Encounter  Procedures  . XR Shoulder Right   No orders of the defined types were placed in this encounter.     Procedures: No procedures performed   Clinical Data: No additional findings.   Subjective: Chief Complaint  Patient presents with  . Right Shoulder - Pain    Patient follows up 2 weeks status post minimally displaced humeral head fracture.  She is overall doing very well.  Pain and strength are both improving.   Review of Systems   Objective: Vital Signs: There were no vitals taken for this visit.  Physical Exam  Ortho Exam Right shoulder exam shows improvement in her range of motion and pain. Specialty Comments:  No specialty comments available.  Imaging: Xr Shoulder Right  Result Date: 11/10/2017 Stable humeral head fracture    PMFS History: Patient Active Problem List   Diagnosis Date Noted  . Age-related osteoporosis without current pathological fracture 08/31/2017  . Overweight 08/31/2017  . Callus 08/31/2017  . S/P placement of cardiac pacemaker 04/27/2017  . Hyperlipidemia 04/27/2017  . Atrophic vaginitis 04/27/2017  . Estrogen deficiency 04/27/2017  . Bezoar   . Actinic keratoses 07/06/2016  . Achalasia   . Wound, open, finger 11/25/2015  . Chemical diabetes  08/26/2015  . Abnormal chest x-ray 08/18/2015  . Right inguinal hernia 04/08/2015  . Pain of right scapula 04/02/2014  . Osteopenia 11/13/2013  . Urinary incontinence 11/13/2013  . Hyperglycemia 07/17/2013  . Intrinsic sphincter deficiency 05/24/2013  . Mixed incontinence urge and stress 05/24/2013  . Reflux esophagitis 01/28/2013  . Sinoatrial node dysfunction ([place]) 01/05/2013  . Sick sinus syndrome ([place]) 01/05/2013  . Sleep apnea 11/21/2012  . Disorder of bone and cartilage 11/21/2012  . Obesity 11/21/2012  . Edema 11/21/2012  . Personal history of other diseases of circulatory system 11/21/2012  . Achalasia of esophagus 10/10/2012  . Gastric AVM 08/07/2012  . Esophageal dysphagia 06/27/2012  . Multinodular thyroid 06/26/2012  . Megaesophagus 06/20/2012  . Polymyalgia rheumatica ([place]) 06/19/2012  . Mediastinal mass 06/19/2012  . Hammer toe 04/17/2012  . Long term current use of systemic steroids 01/27/2012  . Paroxysmal ventricular tachycardia ([place]) 01/18/2012  . Cardiac pacemaker in situ 01/18/2012  . Non-sustained ventricular tachycardia ([place]) 01/18/2012  . Nonsustained ventricular tachycardia ([place]) 01/18/2012  . Sprain of wrist 05/10/2011  . Disorder of rotator cuff syndrome of shoulder and allied disorder 04/01/2011  . Bursitis of shoulder 04/01/2011   Past Medical History:  Diagnosis Date  . Achalasia   . Anemia   . Arthritis    back  . Chronic steroid use   . Complete heart block ([place])  S/P PACEMAKER 2000 W/ GENERATOR CHANGE 2009  . Empyema, right ([place]) PULMOLOGIST-- DR CLANCE   VATS 06/23/2012 cultures negative to date CXR 07/19/12 persistent airfluid levels/  CXR 11-01-2012 IMPROVE RIGHT PLEURAL EFFUSION  . GERD (gastroesophageal reflux disease)   . History of aspiration pneumonitis    DEC 2013  . History of hiatal hernia   . Hypertension   . Inguinal hernia    right  . Intrinsic urethral sphincter deficiency   . Megaesophagus   . Mixed stress and urge  urinary incontinence   . Multinodular thyroid 06/26/2012   Multi nodular goiter. Large nodules in both lobes of the gland.  These nodules fit national criteria for fine needle aspiration  biopsy if not previously assessed.    . OSA on CPAP    cpap, doees not know settings  . Polymyalgia rheumatica (HCC)    on chronic Prednisone 5mg  daily  . RBBB   . S/P dilatation of esophageal stricture     Family History  Problem Relation Age of Onset  . Arthritis Father   . Heart disease Father   . Cancer Brother        ?spine  . Heart disease Brother   . Lung cancer Brother   . Diabetes Son   . Heart disease Mother        Congestive heart failure  . Heart disease Brother   . Arthritis Brother   . Hypertension Brother   . Colon cancer Neg Hx     Past Surgical History:  Procedure Laterality Date  . APPENDECTOMY  1953   w/ removal benign kidney tumor   . BALLOON DILATION  07/24/2012   Procedure: BALLOON DILATION;  Surgeon: Inda Castle, MD;  Location: [facility];  Service: Endoscopy;  Laterality: N/A;  . BALLOON DILATION N/A 12/03/2015   Procedure: BALLOON DILATION;  Surgeon: Mauri Pole, MD;  Location: [facility];  Service: Endoscopy;  Laterality: N/A;  pnuematic balloon  . BALLOON DILATION N/A 03/16/2017   Procedure: BALLOON DILATION;  Surgeon: Mauri Pole, MD;  Location: [facility];  Service: Endoscopy;  Laterality: N/A;  PNUEMATIC BALLOONS  . BOTOX INJECTION  08/07/2012   Procedure: BOTOX INJECTION;  Surgeon: Inda Castle, MD;  [facility];  Service: Endoscopy;  Laterality: N/A;  . BOTOX INJECTION N/A 05/24/2013   Procedure: MACROPLASTIQUE IMPLANT;  Surgeon: Irine Seal, MD;  Location: [facility];  Service: Urology;  Laterality: N/A;  . BOTOX INJECTION  02/25/2014   Procedure: BOTOX INJECTION;  Surgeon: Inda Castle, MD;  Location[facility];  Service: Endoscopy;;  . CARDIAC PACEMAKER PLACEMENT  06/1999  DR RUTH GREENFIELD AT [place]  ( LAST PACER CHECK 05-09-2013) for CHB/   END-OF-LIFE GENERATOR CHANGE  2009  . CATARACT EXTRACTION W/ INTRAOCULAR LENS  IMPLANT, BILATERAL  2005  . CATARACT EXTRACTION W/ INTRAOCULAR LENS  IMPLANT, BILATERAL    . DILATION AND CURETTAGE OF UTERUS    . ESOPHAGOGASTRODUODENOSCOPY  07/24/2012   Procedure: ESOPHAGOGASTRODUODENOSCOPY (EGD);  Surgeon: Inda Castle, MD;  Location: [facility];  Service: Endoscopy;  Laterality: N/A;  . ESOPHAGOGASTRODUODENOSCOPY  08/07/2012   Procedure: ESOPHAGOGASTRODUODENOSCOPY (EGD);  Surgeon: Inda Castle, MD;  Location: [facility];  Service: Endoscopy;  Laterality: N/A;  . ESOPHAGOGASTRODUODENOSCOPY N/A 02/25/2014   Procedure: ESOPHAGOGASTRODUODENOSCOPY (EGD);  Surgeon: Inda Castle, MD;  Location: [facility];  Service: Endoscopy;  Laterality: N/A;  . ESOPHAGOGASTRODUODENOSCOPY N/A 03/16/2017   Procedure: ESOPHAGOGASTRODUODENOSCOPY (EGD);  Surgeon: Mauri Pole, MD;  Location[facility];  Service: Endoscopy;  Laterality: N/A;  . ESOPHAGOGASTRODUODENOSCOPY (EGD) WITH ESOPHAGEAL DILATION  06/27/2012   Procedure: ESOPHAGOGASTRODUODENOSCOPY (EGD) WITH ESOPHAGEAL DILATION;  Surgeon: Ladene Artist, MD,FACG;  Location: [facility];  Service: Endoscopy;  Laterality: N/A;  . ESOPHAGOGASTRODUODENOSCOPY (EGD) WITH PROPOFOL N/A 10/09/2015   Procedure: ESOPHAGOGASTRODUODENOSCOPY (EGD) WITH PROPOFOL ( WITH BOTOX);  Surgeon: Milus Banister, MD;  Location: [facility];  Service: Endoscopy;  Laterality: N/A;  . ESOPHAGOGASTRODUODENOSCOPY (EGD) WITH PROPOFOL N/A 12/03/2015   Procedure: ESOPHAGOGASTRODUODENOSCOPY (EGD) WITH PROPOFOL;  Surgeon: Mauri Pole, MD;  Location: [facility];  Service: Endoscopy;  Laterality: N/A;  . JOINT REPLACEMENT     right hip, 2001  . PACEMAKER GENERATOR CHANGE  12/13/2007   at [facility]  . TOE SURGERY  2013   left 3rd toe HAMMERTOE REPAIR  . TONSILLECTOMY  AS CHILD  . TOTAL HIP ARTHROPLASTY Right 2001  . VERICOSE  VEIN LIGATION    . VIDEO ASSISTED THORACOSCOPY (VATS)/DECORTICATION  06/23/2012   Procedure: VIDEO ASSISTED THORACOSCOPY (VATS)/DECORTICATION;  Surgeon: Grace Isaac, MD;  Location: [place];  Service: Thoracic;  Laterality: Right;  Marland Kitchen VIDEO ASSISTED THORACOSCOPY (VATS)/EMPYEMA     06/23/2012  . VIDEO BRONCHOSCOPY  06/23/2012   Procedure: VIDEO BRONCHOSCOPY;  Surgeon: Grace Isaac, MD;  Location: [facility];  Service: Thoracic;  Laterality: N/A;   Social History   Occupational History  . Occupation: Retired Surveyor, quantity  Tobacco Use  . Smoking status: Passive Smoke Exposure - Never Smoker  . Smokeless tobacco: Never Used  . Tobacco comment: Exposure through father only.  Substance and Sexual Activity  . Alcohol use: Yes    Alcohol/week: 1.8 oz    Types: 3 Glasses of wine per week    Comment: 1 drink x 3 days/week  . Drug use: No  . Sexual activity: Never    Comment: married

## 2017-11-17 DIAGNOSIS — B5731 Megaesophagus in Chagas' disease: Secondary | ICD-10-CM | POA: Diagnosis not present

## 2017-11-17 DIAGNOSIS — Z96641 Presence of right artificial hip joint: Secondary | ICD-10-CM | POA: Diagnosis not present

## 2017-11-17 DIAGNOSIS — R29898 Other symptoms and signs involving the musculoskeletal system: Secondary | ICD-10-CM | POA: Diagnosis not present

## 2017-11-17 DIAGNOSIS — J181 Lobar pneumonia, unspecified organism: Secondary | ICD-10-CM | POA: Diagnosis not present

## 2017-11-17 DIAGNOSIS — S42201A Unspecified fracture of upper end of right humerus, initial encounter for closed fracture: Secondary | ICD-10-CM | POA: Diagnosis not present

## 2017-11-17 DIAGNOSIS — R262 Difficulty in walking, not elsewhere classified: Secondary | ICD-10-CM | POA: Diagnosis not present

## 2017-11-17 DIAGNOSIS — R278 Other lack of coordination: Secondary | ICD-10-CM | POA: Diagnosis not present

## 2017-11-17 DIAGNOSIS — M8580 Other specified disorders of bone density and structure, unspecified site: Secondary | ICD-10-CM | POA: Diagnosis not present

## 2017-11-17 DIAGNOSIS — Z950 Presence of cardiac pacemaker: Secondary | ICD-10-CM | POA: Diagnosis not present

## 2017-11-17 DIAGNOSIS — M353 Polymyalgia rheumatica: Secondary | ICD-10-CM | POA: Diagnosis not present

## 2017-11-17 DIAGNOSIS — G4733 Obstructive sleep apnea (adult) (pediatric): Secondary | ICD-10-CM | POA: Diagnosis not present

## 2017-11-17 DIAGNOSIS — M6281 Muscle weakness (generalized): Secondary | ICD-10-CM | POA: Diagnosis not present

## 2017-11-18 DIAGNOSIS — M353 Polymyalgia rheumatica: Secondary | ICD-10-CM | POA: Diagnosis not present

## 2017-11-18 DIAGNOSIS — M8580 Other specified disorders of bone density and structure, unspecified site: Secondary | ICD-10-CM | POA: Diagnosis not present

## 2017-11-18 DIAGNOSIS — M6281 Muscle weakness (generalized): Secondary | ICD-10-CM | POA: Diagnosis not present

## 2017-11-18 DIAGNOSIS — R29898 Other symptoms and signs involving the musculoskeletal system: Secondary | ICD-10-CM | POA: Diagnosis not present

## 2017-11-18 DIAGNOSIS — S42201A Unspecified fracture of upper end of right humerus, initial encounter for closed fracture: Secondary | ICD-10-CM | POA: Diagnosis not present

## 2017-11-18 DIAGNOSIS — J181 Lobar pneumonia, unspecified organism: Secondary | ICD-10-CM | POA: Diagnosis not present

## 2017-11-22 DIAGNOSIS — M8580 Other specified disorders of bone density and structure, unspecified site: Secondary | ICD-10-CM | POA: Diagnosis not present

## 2017-11-22 DIAGNOSIS — S42201A Unspecified fracture of upper end of right humerus, initial encounter for closed fracture: Secondary | ICD-10-CM | POA: Diagnosis not present

## 2017-11-22 DIAGNOSIS — M6281 Muscle weakness (generalized): Secondary | ICD-10-CM | POA: Diagnosis not present

## 2017-11-22 DIAGNOSIS — M353 Polymyalgia rheumatica: Secondary | ICD-10-CM | POA: Diagnosis not present

## 2017-11-22 DIAGNOSIS — R29898 Other symptoms and signs involving the musculoskeletal system: Secondary | ICD-10-CM | POA: Diagnosis not present

## 2017-11-22 DIAGNOSIS — J181 Lobar pneumonia, unspecified organism: Secondary | ICD-10-CM | POA: Diagnosis not present

## 2017-11-24 DIAGNOSIS — M6281 Muscle weakness (generalized): Secondary | ICD-10-CM | POA: Diagnosis not present

## 2017-11-24 DIAGNOSIS — M8580 Other specified disorders of bone density and structure, unspecified site: Secondary | ICD-10-CM | POA: Diagnosis not present

## 2017-11-24 DIAGNOSIS — J181 Lobar pneumonia, unspecified organism: Secondary | ICD-10-CM | POA: Diagnosis not present

## 2017-11-24 DIAGNOSIS — R29898 Other symptoms and signs involving the musculoskeletal system: Secondary | ICD-10-CM | POA: Diagnosis not present

## 2017-11-24 DIAGNOSIS — S42201A Unspecified fracture of upper end of right humerus, initial encounter for closed fracture: Secondary | ICD-10-CM | POA: Diagnosis not present

## 2017-11-24 DIAGNOSIS — M353 Polymyalgia rheumatica: Secondary | ICD-10-CM | POA: Diagnosis not present

## 2017-11-29 DIAGNOSIS — R29898 Other symptoms and signs involving the musculoskeletal system: Secondary | ICD-10-CM | POA: Diagnosis not present

## 2017-11-29 DIAGNOSIS — M353 Polymyalgia rheumatica: Secondary | ICD-10-CM | POA: Diagnosis not present

## 2017-11-29 DIAGNOSIS — S42201A Unspecified fracture of upper end of right humerus, initial encounter for closed fracture: Secondary | ICD-10-CM | POA: Diagnosis not present

## 2017-11-29 DIAGNOSIS — M8580 Other specified disorders of bone density and structure, unspecified site: Secondary | ICD-10-CM | POA: Diagnosis not present

## 2017-11-29 DIAGNOSIS — J181 Lobar pneumonia, unspecified organism: Secondary | ICD-10-CM | POA: Diagnosis not present

## 2017-11-29 DIAGNOSIS — M6281 Muscle weakness (generalized): Secondary | ICD-10-CM | POA: Diagnosis not present

## 2017-12-02 DIAGNOSIS — M6281 Muscle weakness (generalized): Secondary | ICD-10-CM | POA: Diagnosis not present

## 2017-12-02 DIAGNOSIS — R29898 Other symptoms and signs involving the musculoskeletal system: Secondary | ICD-10-CM | POA: Diagnosis not present

## 2017-12-02 DIAGNOSIS — M353 Polymyalgia rheumatica: Secondary | ICD-10-CM | POA: Diagnosis not present

## 2017-12-02 DIAGNOSIS — J181 Lobar pneumonia, unspecified organism: Secondary | ICD-10-CM | POA: Diagnosis not present

## 2017-12-02 DIAGNOSIS — M8580 Other specified disorders of bone density and structure, unspecified site: Secondary | ICD-10-CM | POA: Diagnosis not present

## 2017-12-02 DIAGNOSIS — S42201A Unspecified fracture of upper end of right humerus, initial encounter for closed fracture: Secondary | ICD-10-CM | POA: Diagnosis not present

## 2017-12-13 ENCOUNTER — Ambulatory Visit (INDEPENDENT_AMBULATORY_CARE_PROVIDER_SITE_OTHER): Payer: Medicare Other | Admitting: Orthopaedic Surgery

## 2017-12-13 ENCOUNTER — Encounter (INDEPENDENT_AMBULATORY_CARE_PROVIDER_SITE_OTHER): Payer: Self-pay | Admitting: Orthopaedic Surgery

## 2017-12-13 ENCOUNTER — Ambulatory Visit (INDEPENDENT_AMBULATORY_CARE_PROVIDER_SITE_OTHER): Payer: Medicare Other

## 2017-12-13 DIAGNOSIS — S42291A Other displaced fracture of upper end of right humerus, initial encounter for closed fracture: Secondary | ICD-10-CM | POA: Diagnosis not present

## 2017-12-13 NOTE — Progress Notes (Signed)
Office Visit Note   Patient: [name]           Date of Birth: 1928-08-17           MRN: [id number] Visit Date: 12/13/2017              Requested by: Blanchie Serve, MD [address] [place], [place] [address] PCP: Blanchie Serve, MD   Assessment & Plan: Visit Diagnoses:  1. Fracture of humeral head, closed, right, initial encounter     Plan: At this her x-rays have demonstrated radiographic healing.  I have given her updated instructions with physical therapy.  She tolerated and begin strengthening.  From my standpoint I can see her back as needed.  Questions encouraged and answered.  Follow-Up Instructions: Return if symptoms worsen or fail to improve.   Orders:  Orders Placed This Encounter  Procedures  . XR Shoulder Right   No orders of the defined types were placed in this encounter.     Procedures: No procedures performed   Clinical Data: No additional findings.   Subjective: Chief Complaint  Patient presents with  . Right Shoulder - Pain, Follow-up    Patient follows up for her humeral head fracture.  She is better.  Her range of motion and pain are significantly.  She does have some stiffness.   Review of Systems   Objective: Vital Signs: There were no vitals taken for this visit.  Physical Exam  Ortho Exam Right shoulder exam shows significantly improved range of motion and strength. Specialty Comments:  No specialty comments available.  Imaging: Xr Shoulder Right  Result Date: 12/13/2017 Healed humeral head fracture.    PMFS History: Patient Active Problem List   Diagnosis Date Noted  . Age-related osteoporosis without current pathological fracture 08/31/2017  . Overweight 08/31/2017  . Callus 08/31/2017  . S/P placement of cardiac pacemaker 04/27/2017  . Hyperlipidemia 04/27/2017  . Atrophic vaginitis 04/27/2017  . Estrogen deficiency 04/27/2017  . Bezoar   . Actinic keratoses 07/06/2016  . Achalasia   . Wound, open,  finger 11/25/2015  . 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 Abnormal chest x-ray 08/18/2015  . Right inguinal hernia 04/08/2015  . Pain of right scapula 04/02/2014  . Osteopenia 11/13/2013  . Urinary incontinence 11/13/2013  . Hyperglycemia 07/17/2013  . Intrinsic sphincter deficiency 05/24/2013  . Mixed incontinence urge and stress 05/24/2013  . Reflux esophagitis 01/28/2013  . Sinoatrial node dysfunction ([place]) 01/05/2013  . Sick sinus syndrome ([place]) 01/05/2013  . Sleep apnea 11/21/2012  . Disorder of bone and cartilage 11/21/2012  . Obesity 11/21/2012  . Edema 11/21/2012  . Personal history of other diseases of circulatory system 11/21/2012  . Achalasia of esophagus 10/10/2012  . Gastric AVM 08/07/2012  . Esophageal dysphagia 06/27/2012  . Multinodular thyroid 06/26/2012  . Megaesophagus 06/20/2012  . Polymyalgia rheumatica ([place]) 06/19/2012  . Mediastinal mass 06/19/2012  . Hammer toe 04/17/2012  . Long term current use of systemic steroids 01/27/2012  . Paroxysmal ventricular tachycardia ([place]) 01/18/2012  . Cardiac pacemaker in situ 01/18/2012  . Non-sustained ventricular tachycardia ([place]) 01/18/2012  . Nonsustained ventricular tachycardia ([place]) 01/18/2012  . Sprain of wrist 05/10/2011  . Disorder of rotator cuff syndrome of shoulder and allied disorder 04/01/2011  . Bursitis of shoulder 04/01/2011   Past Medical History:  Diagnosis Date  . Achalasia   . Anemia   . Arthritis    back  . Chronic steroid use   . Complete heart block ([place])  S/P PACEMAKER 2000 W/ GENERATOR CHANGE 2009  . Empyema, right ([place]) PULMOLOGIST-- DR CLANCE   VATS 06/23/2012 cultures negative to date CXR 07/19/12 persistent airfluid levels/  CXR 11-01-2012 IMPROVE RIGHT PLEURAL EFFUSION  . GERD (gastroesophageal reflux disease)   . History of aspiration pneumonitis    DEC 2013  . History of hiatal hernia   . Hypertension   . Inguinal hernia    right  . Intrinsic urethral sphincter deficiency   .  Megaesophagus   . Mixed stress and urge urinary incontinence   . Multinodular thyroid 06/26/2012   Multi nodular goiter. Large nodules in both lobes of the gland.  These nodules fit national criteria for fine needle aspiration  biopsy if not previously assessed.    . OSA on CPAP    cpap, doees not know settings  . Polymyalgia rheumatica ([facility])    on chronic Prednisone 5mg  daily  . RBBB   . S/P dilatation of esophageal stricture     Family History  Problem Relation Age of Onset  . Arthritis Father   . Heart disease Father   . Cancer Brother        ?spine  . Heart disease Brother   . Lung cancer Brother   . Diabetes Son   . Heart disease Mother        Congestive heart failure  . Heart disease Brother   . Arthritis Brother   . Hypertension Brother   . Colon cancer Neg Hx     Past Surgical History:  Procedure Laterality Date  . APPENDECTOMY  1953   w/ removal benign kidney tumor   . BALLOON DILATION  07/24/2012   Procedure: BALLOON DILATION;  Surgeon: Inda Castle, MD;  Location: [facility];  Service: Endoscopy;  Laterality: N/A;  . BALLOON DILATION N/A 12/03/2015   Procedure: BALLOON DILATION;  Surgeon: Mauri Pole, MD;  Location: [facility];  Service: Endoscopy;  Laterality: N/A;  pnuematic balloon  . BALLOON DILATION N/A 03/16/2017   Procedure: BALLOON DILATION;  Surgeon: Mauri Pole, MD;  Location: [facility];  Service: Endoscopy;  Laterality: N/A;  PNUEMATIC BALLOONS  . BOTOX INJECTION  08/07/2012   Procedure: BOTOX INJECTION;  Surgeon: Inda Castle, MD;  Location: [facility];  Service: Endoscopy;  Laterality: N/A;  . BOTOX INJECTION N/A 05/24/2013   Procedure: MACROPLASTIQUE IMPLANT;  Surgeon: Irine Seal, MD;  Location: [facility];  Service: Urology;  Laterality: N/A;  . BOTOX INJECTION  02/25/2014   Procedure: BOTOX INJECTION;  Surgeon: Inda Castle, MD;  Location: [facility];  Service: Endoscopy;;  . CARDIAC PACEMAKER PLACEMENT   06/1999  DR RUTH GREENFIELD AT [facility]  ( LAST PACER CHECK 05-09-2013) for CHB/   END-OF-LIFE GENERATOR CHANGE  2009  . CATARACT EXTRACTION W/ INTRAOCULAR LENS  IMPLANT, BILATERAL  2005  . CATARACT EXTRACTION W/ INTRAOCULAR LENS  IMPLANT, BILATERAL    . DILATION AND CURETTAGE OF UTERUS    . ESOPHAGOGASTRODUODENOSCOPY  07/24/2012   Procedure: ESOPHAGOGASTRODUODENOSCOPY (EGD);  Surgeon: Inda Castle, MD;  Location: [facility];  Service: Endoscopy;  Laterality: N/A;  . ESOPHAGOGASTRODUODENOSCOPY  08/07/2012   Procedure: ESOPHAGOGASTRODUODENOSCOPY (EGD);  Surgeon: Inda Castle, MD;  Location: [facility];  Service: Endoscopy;  Laterality: N/A;  . ESOPHAGOGASTRODUODENOSCOPY N/A 02/25/2014   Procedure: ESOPHAGOGASTRODUODENOSCOPY (EGD);  Surgeon: Inda Castle, MD;  Location: [facility];  Service: Endoscopy;  Laterality: N/A;  . ESOPHAGOGASTRODUODENOSCOPY N/A 03/16/2017   Procedure: ESOPHAGOGASTRODUODENOSCOPY (EGD);  Surgeon: Mauri Pole, MD;  Location: [facility];  Service: Endoscopy;  Laterality: N/A;  . ESOPHAGOGASTRODUODENOSCOPY (EGD) WITH ESOPHAGEAL DILATION  06/27/2012   Procedure: ESOPHAGOGASTRODUODENOSCOPY (EGD) WITH ESOPHAGEAL DILATION;  Surgeon: Ladene Artist, MD,FACG;  Location: [facility];  Service: Endoscopy;  Laterality: N/A;  . ESOPHAGOGASTRODUODENOSCOPY (EGD) WITH PROPOFOL N/A 10/09/2015   Procedure: ESOPHAGOGASTRODUODENOSCOPY (EGD) WITH PROPOFOL ( WITH BOTOX);  Surgeon: Milus Banister, MD;  Location: [facility];  Service: Endoscopy;  Laterality: N/A;  . ESOPHAGOGASTRODUODENOSCOPY (EGD) WITH PROPOFOL N/A 12/03/2015   Procedure: ESOPHAGOGASTRODUODENOSCOPY (EGD) WITH PROPOFOL;  Surgeon: Mauri Pole, MD;  Location: [facility];  Service: Endoscopy;  Laterality: N/A;  . JOINT REPLACEMENT     right hip, 2001  . PACEMAKER GENERATOR CHANGE  12/13/2007   at [facility]  . TOE SURGERY  2013   left 3rd toe HAMMERTOE REPAIR  . TONSILLECTOMY  AS CHILD  . TOTAL  HIP ARTHROPLASTY Right 2001  . VERICOSE VEIN LIGATION    . VIDEO ASSISTED THORACOSCOPY (VATS)/DECORTICATION  06/23/2012   Procedure: VIDEO ASSISTED THORACOSCOPY (VATS)/DECORTICATION;  Surgeon: Grace Isaac, MD;  Location: [facility];  Service: Thoracic;  Laterality: Right;  Marland Kitchen VIDEO ASSISTED THORACOSCOPY (VATS)/EMPYEMA     06/23/2012  . VIDEO BRONCHOSCOPY  06/23/2012   Procedure: VIDEO BRONCHOSCOPY;  Surgeon: Grace Isaac, MD;  Location: [facility];  Service: Thoracic;  Laterality: N/A;   Social History   Occupational History  . Occupation: Retired Surveyor, quantity  Tobacco Use  . Smoking status: Passive Smoke Exposure - Never Smoker  . Smokeless tobacco: Never Used  . Tobacco comment: Exposure through father only.  Substance and Sexual Activity  . Alcohol use: Yes    Alcohol/week: 1.8 oz    Types: 3 Glasses of wine per week    Comment: 1 drink x 3 days/week  . Drug use: No  . Sexual activity: Never    Comment: married

## 2017-12-14 DIAGNOSIS — M8580 Other specified disorders of bone density and structure, unspecified site: Secondary | ICD-10-CM | POA: Diagnosis not present

## 2017-12-14 DIAGNOSIS — M6281 Muscle weakness (generalized): Secondary | ICD-10-CM | POA: Diagnosis not present

## 2017-12-14 DIAGNOSIS — R278 Other lack of coordination: Secondary | ICD-10-CM | POA: Diagnosis not present

## 2017-12-14 DIAGNOSIS — G4733 Obstructive sleep apnea (adult) (pediatric): Secondary | ICD-10-CM | POA: Diagnosis not present

## 2017-12-14 DIAGNOSIS — B5731 Megaesophagus in Chagas' disease: Secondary | ICD-10-CM | POA: Diagnosis not present

## 2017-12-14 DIAGNOSIS — Z96641 Presence of right artificial hip joint: Secondary | ICD-10-CM | POA: Diagnosis not present

## 2017-12-14 DIAGNOSIS — R262 Difficulty in walking, not elsewhere classified: Secondary | ICD-10-CM | POA: Diagnosis not present

## 2017-12-14 DIAGNOSIS — J181 Lobar pneumonia, unspecified organism: Secondary | ICD-10-CM | POA: Diagnosis not present

## 2017-12-14 DIAGNOSIS — S42201A Unspecified fracture of upper end of right humerus, initial encounter for closed fracture: Secondary | ICD-10-CM | POA: Diagnosis not present

## 2017-12-14 DIAGNOSIS — Z950 Presence of cardiac pacemaker: Secondary | ICD-10-CM | POA: Diagnosis not present

## 2017-12-14 DIAGNOSIS — R29898 Other symptoms and signs involving the musculoskeletal system: Secondary | ICD-10-CM | POA: Diagnosis not present

## 2017-12-14 DIAGNOSIS — M353 Polymyalgia rheumatica: Secondary | ICD-10-CM | POA: Diagnosis not present

## 2017-12-19 DIAGNOSIS — J181 Lobar pneumonia, unspecified organism: Secondary | ICD-10-CM | POA: Diagnosis not present

## 2017-12-19 DIAGNOSIS — R29898 Other symptoms and signs involving the musculoskeletal system: Secondary | ICD-10-CM | POA: Diagnosis not present

## 2017-12-19 DIAGNOSIS — S42201A Unspecified fracture of upper end of right humerus, initial encounter for closed fracture: Secondary | ICD-10-CM | POA: Diagnosis not present

## 2017-12-19 DIAGNOSIS — M8580 Other specified disorders of bone density and structure, unspecified site: Secondary | ICD-10-CM | POA: Diagnosis not present

## 2017-12-19 DIAGNOSIS — M353 Polymyalgia rheumatica: Secondary | ICD-10-CM | POA: Diagnosis not present

## 2017-12-19 DIAGNOSIS — M6281 Muscle weakness (generalized): Secondary | ICD-10-CM | POA: Diagnosis not present

## 2017-12-22 DIAGNOSIS — M8580 Other specified disorders of bone density and structure, unspecified site: Secondary | ICD-10-CM | POA: Diagnosis not present

## 2017-12-22 DIAGNOSIS — M6281 Muscle weakness (generalized): Secondary | ICD-10-CM | POA: Diagnosis not present

## 2017-12-22 DIAGNOSIS — M353 Polymyalgia rheumatica: Secondary | ICD-10-CM | POA: Diagnosis not present

## 2017-12-22 DIAGNOSIS — S42201A Unspecified fracture of upper end of right humerus, initial encounter for closed fracture: Secondary | ICD-10-CM | POA: Diagnosis not present

## 2017-12-22 DIAGNOSIS — J181 Lobar pneumonia, unspecified organism: Secondary | ICD-10-CM | POA: Diagnosis not present

## 2017-12-22 DIAGNOSIS — R29898 Other symptoms and signs involving the musculoskeletal system: Secondary | ICD-10-CM | POA: Diagnosis not present

## 2017-12-27 DIAGNOSIS — M6281 Muscle weakness (generalized): Secondary | ICD-10-CM | POA: Diagnosis not present

## 2017-12-27 DIAGNOSIS — S42201A Unspecified fracture of upper end of right humerus, initial encounter for closed fracture: Secondary | ICD-10-CM | POA: Diagnosis not present

## 2017-12-27 DIAGNOSIS — M353 Polymyalgia rheumatica: Secondary | ICD-10-CM | POA: Diagnosis not present

## 2017-12-27 DIAGNOSIS — M8580 Other specified disorders of bone density and structure, unspecified site: Secondary | ICD-10-CM | POA: Diagnosis not present

## 2017-12-27 DIAGNOSIS — R29898 Other symptoms and signs involving the musculoskeletal system: Secondary | ICD-10-CM | POA: Diagnosis not present

## 2017-12-27 DIAGNOSIS — J181 Lobar pneumonia, unspecified organism: Secondary | ICD-10-CM | POA: Diagnosis not present

## 2018-01-23 ENCOUNTER — Ambulatory Visit (INDEPENDENT_AMBULATORY_CARE_PROVIDER_SITE_OTHER): Payer: Medicare Other | Admitting: *Deleted

## 2018-01-23 DIAGNOSIS — I495 Sick sinus syndrome: Secondary | ICD-10-CM

## 2018-01-23 NOTE — Progress Notes (Signed)
Remote pacemaker transmission.   

## 2018-01-24 LAB — CUP PACEART REMOTE DEVICE CHECK
Battery Remaining Longevity: 20 mo
Brady Statistic AP VP Percent: 4 %
Brady Statistic AS VS Percent: 81 %
Date Time Interrogation Session: 20190715140952
Implantable Lead Implant Date: 20001227
Implantable Lead Implant Date: 20001227
Implantable Lead Model: 5076
Lead Channel Impedance Value: 595 Ohm
Lead Channel Pacing Threshold Amplitude: 0.625 V
Lead Channel Pacing Threshold Pulse Width: 0.4 ms
Lead Channel Setting Pacing Amplitude: 2 V
Lead Channel Setting Sensing Sensitivity: 4 mV
MDC IDC LEAD LOCATION: 753859
MDC IDC LEAD LOCATION: 753860
MDC IDC MSMT BATTERY IMPEDANCE: 3168 Ohm
MDC IDC MSMT BATTERY VOLTAGE: 2.72 V
MDC IDC MSMT LEADCHNL RA IMPEDANCE VALUE: 428 Ohm
MDC IDC MSMT LEADCHNL RA PACING THRESHOLD AMPLITUDE: 0.625 V
MDC IDC MSMT LEADCHNL RA PACING THRESHOLD PULSEWIDTH: 0.4 ms
MDC IDC PG IMPLANT DT: 20090603
MDC IDC SET LEADCHNL RV PACING AMPLITUDE: 2.5 V
MDC IDC SET LEADCHNL RV PACING PULSEWIDTH: 0.4 ms
MDC IDC STAT BRADY AP VS PERCENT: 15 %
MDC IDC STAT BRADY AS VP PERCENT: 0 %

## 2018-01-25 ENCOUNTER — Encounter: Payer: Self-pay | Admitting: Cardiology

## 2018-01-25 ENCOUNTER — Non-Acute Institutional Stay: Payer: Medicare Other | Admitting: Internal Medicine

## 2018-01-25 ENCOUNTER — Encounter: Payer: Self-pay | Admitting: Internal Medicine

## 2018-01-25 VITALS — BP 128/62 | HR 80 | Temp 97.5°F | Resp 18 | Ht 60.0 in | Wt 145.4 lb

## 2018-01-25 DIAGNOSIS — Z7189 Other specified counseling: Secondary | ICD-10-CM | POA: Diagnosis not present

## 2018-01-25 DIAGNOSIS — E042 Nontoxic multinodular goiter: Secondary | ICD-10-CM

## 2018-01-25 DIAGNOSIS — K220 Achalasia of cardia: Secondary | ICD-10-CM | POA: Diagnosis not present

## 2018-01-25 DIAGNOSIS — I4729 Other ventricular tachycardia: Secondary | ICD-10-CM

## 2018-01-25 DIAGNOSIS — G4733 Obstructive sleep apnea (adult) (pediatric): Secondary | ICD-10-CM | POA: Diagnosis not present

## 2018-01-25 DIAGNOSIS — N952 Postmenopausal atrophic vaginitis: Secondary | ICD-10-CM | POA: Diagnosis not present

## 2018-01-25 DIAGNOSIS — R739 Hyperglycemia, unspecified: Secondary | ICD-10-CM

## 2018-01-25 DIAGNOSIS — N3946 Mixed incontinence: Secondary | ICD-10-CM

## 2018-01-25 DIAGNOSIS — K4090 Unilateral inguinal hernia, without obstruction or gangrene, not specified as recurrent: Secondary | ICD-10-CM

## 2018-01-25 DIAGNOSIS — I472 Ventricular tachycardia: Secondary | ICD-10-CM

## 2018-01-25 NOTE — Progress Notes (Signed)
[facility]  Provider: Blanchie Serve MD   Location:  [place] of Service:  Clinic (12)  PCP: Blanchie Serve, MD Patient Care Team: Blanchie Serve, MD as PCP - General (Internal Medicine) [place] [name], MD as Consulting Physician (Cardiology) Irine Seal, MD as Attending Physician (Urology) Clance, Armando Reichert, MD as Consulting Physician (Pulmonary Disease) Grace Isaac, MD as Consulting Physician (Cardiothoracic Surgery) Mauri Pole, MD as Consulting Physician (Gastroenterology)  Extended Emergency Contact Information Primary Emergency Contact: Lasure,Francis Address: [phone], [place] [address] Johnnette Litter of [place] Phone: [phone] Relation: Spouse Secondary Emergency Contact: Halliday,Dorothy Address: [address]          [place], [place] [address] Johnnette Litter of [place] Phone: [phone] Mobile Phone: [phone] Relation: Daughter  Code Status: DNR  Goals of Care: Advanced Directive information Advanced Directives 01/25/2018  Does Patient Have a Medical Advance Directive? -  Type of Advance Directive -  Does patient want to make changes to medical advance directive? Yes (MAU/Ambulatory/Procedural Areas - Information given)  Copy of [place] in Chart? -  Would patient like information on creating a medical advance directive? -  Pre-existing out of facility DNR order (yellow form or pink MOST form) -      Chief Complaint  Patient presents with  . Annual Exam    annual exam  . Medication Refill    No refills needed at this time.    HPI: Patient is a 84 y.o. female seen today for annual visit. She denies any concern in particular this visit. She had seen her dentist with TMJ. Denies pain this visit. She follows with Gyn for incontinence and vaginal atrophy. She is seen by cardiology for her bradycardia s/p pacemaker.   Past Medical  History:  Diagnosis Date  . Achalasia   . Anemia   . Arthritis    back  . Chronic steroid use   . Complete heart block ([place])    S/P PACEMAKER 2000 W/ GENERATOR CHANGE 2009  . Empyema, right ([place]) PULMOLOGIST-- DR CLANCE   VATS 06/23/2012 cultures negative to date CXR 07/19/12 persistent airfluid levels/  CXR 11-01-2012 IMPROVE RIGHT PLEURAL EFFUSION  . GERD (gastroesophageal reflux disease)   . History of aspiration pneumonitis    DEC 2013  . History of hiatal hernia   . Hypertension   . Inguinal hernia    right  . Intrinsic urethral sphincter deficiency   . Megaesophagus   . Mixed stress and urge urinary incontinence   . Multinodular thyroid 06/26/2012   Multi nodular goiter. Large nodules in both lobes of the gland.  These nodules fit national criteria for fine needle aspiration  biopsy if not previously assessed.    . OSA on CPAP    cpap, doees not know settings  . Polymyalgia rheumatica (HCC)    on chronic Prednisone 1m daily  . RBBB   . S/P dilatation of esophageal stricture    Past Surgical History:  Procedure Laterality Date  . APPENDECTOMY  1953   w/ removal benign kidney tumor   . BALLOON DILATION  07/24/2012   Procedure: BALLOON DILATION;  Surgeon: RInda Castle MD;  [facility];  Service: Endoscopy;  Laterality: N/A;  . BALLOON DILATION N/A 12/03/2015   Procedure: BALLOON DILATION;  Surgeon: KMauri Pole MD;  [facility];  Service: Endoscopy;  Laterality: N/A;  pnuematic balloon  . BALLOON DILATION N/A 03/16/2017   Procedure: BALLOON DILATION;  Surgeon: Mauri Pole, MD;  Location: [facility];  Service: Endoscopy;  Laterality: N/A;  PNUEMATIC BALLOONS  . BOTOX INJECTION  08/07/2012   Procedure: BOTOX INJECTION;  Surgeon: Inda Castle, MD;  [facility];  Service: Endoscopy;  Laterality: N/A;  . BOTOX INJECTION N/A 05/24/2013   Procedure: MACROPLASTIQUE IMPLANT;  Surgeon: Irine Seal, MD;  Location: [facility];  Service: Urology;  Laterality: N/A;  . BOTOX INJECTION  02/25/2014   Procedure: BOTOX INJECTION;  Surgeon: Inda Castle, MD;  [facility]: [facility];  Service: Endoscopy;;  . CARDIAC PACEMAKER PLACEMENT  06/1999  DR RUTH GREENFIELD AT [facility]  ( LAST PACER CHECK 05-09-2013) for CHB/   END-OF-LIFE GENERATOR CHANGE  2009  . CATARACT EXTRACTION W/ INTRAOCULAR LENS  IMPLANT, BILATERAL  2005  . CATARACT EXTRACTION W/ INTRAOCULAR LENS  IMPLANT, BILATERAL    . DILATION AND CURETTAGE OF UTERUS    . ESOPHAGOGASTRODUODENOSCOPY  07/24/2012   Procedure: ESOPHAGOGASTRODUODENOSCOPY (EGD);  Surgeon: Inda Castle, MD;  [facility];  Service: Endoscopy;  Laterality: N/A;  . ESOPHAGOGASTRODUODENOSCOPY  08/07/2012   Procedure: ESOPHAGOGASTRODUODENOSCOPY (EGD);  Surgeon: Inda Castle, MD;  [facility];  Service: Endoscopy;  Laterality: N/A;  . ESOPHAGOGASTRODUODENOSCOPY N/A 02/25/2014   Procedure: ESOPHAGOGASTRODUODENOSCOPY (EGD);  Surgeon: Inda Castle, MD;  [facility];  Service: Endoscopy;  Laterality: N/A;  . ESOPHAGOGASTRODUODENOSCOPY N/A 03/16/2017   Procedure: ESOPHAGOGASTRODUODENOSCOPY (EGD);  Surgeon: Mauri Pole, MD;  Location[facility];  Service: Endoscopy;  Laterality: N/A;  . ESOPHAGOGASTRODUODENOSCOPY (EGD) WITH ESOPHAGEAL DILATION  06/27/2012   Procedure: ESOPHAGOGASTRODUODENOSCOPY (EGD) WITH ESOPHAGEAL DILATION;  Surgeon: Ladene Artist, MD,FACG;  Location: [facility];  Service: Endoscopy;  Laterality: N/A;  . ESOPHAGOGASTRODUODENOSCOPY (EGD) WITH PROPOFOL N/A 10/09/2015   Procedure: ESOPHAGOGASTRODUODENOSCOPY (EGD) WITH PROPOFOL ( WITH BOTOX);  Surgeon: Milus Banister, MD;  [facility];  Service: Endoscopy;  Laterality: N/A;  . ESOPHAGOGASTRODUODENOSCOPY (EGD) WITH PROPOFOL N/A 12/03/2015   Procedure: ESOPHAGOGASTRODUODENOSCOPY (EGD) WITH PROPOFOL;  Surgeon: Mauri Pole, MD;  Location: [facility];   Service: Endoscopy;  Laterality: N/A;  . JOINT REPLACEMENT     right hip, 2001  . PACEMAKER GENERATOR CHANGE  12/13/2007   at [facility]  . TOE SURGERY  2013   left 3rd toe HAMMERTOE REPAIR  . TONSILLECTOMY  AS CHILD  . TOTAL HIP ARTHROPLASTY Right 2001  . VERICOSE VEIN LIGATION    . VIDEO ASSISTED THORACOSCOPY (VATS)/DECORTICATION  06/23/2012   Procedure: VIDEO ASSISTED THORACOSCOPY (VATS)/DECORTICATION;  Surgeon: Grace Isaac, MD;  Location: [facility];  Service: Thoracic;  Laterality: Right;  Marland Kitchen VIDEO ASSISTED THORACOSCOPY (VATS)/EMPYEMA     06/23/2012  . VIDEO BRONCHOSCOPY  06/23/2012   Procedure: VIDEO BRONCHOSCOPY;  Surgeon: Grace Isaac, MD;  Location: [facility];  Service: Thoracic;  Laterality: N/A;    reports that she is a non-smoker but has been exposed to tobacco smoke. She has never used smokeless tobacco. She reports that she drinks about 1.8 oz of alcohol per week. She reports that she does not use drugs. Social History   Socioeconomic History  . Marital status: Married    Spouse name: Not on file  . Number of children: 61  . Years of education: Not on file  . Highest education level: Not on file  Occupational History  . Occupation: Retired Surveyor, quantity  Social Needs  . Financial resource strain: Not on file  . Food insecurity:    Worry: Not on file    Inability: Not on file  . Transportation needs:    Medical: Not on file    Non-medical: Not on file  Tobacco Use  . Smoking status: Passive Smoke Exposure - Never Smoker  . Smokeless tobacco: Never Used  . Tobacco comment: Exposure through father only.  Substance and Sexual Activity  . Alcohol use: Yes    Alcohol/week: 1.8 oz    Types: 3 Glasses of wine per week    Comment: 1 drink x 3 days/week  . Drug use: No  . Sexual activity: Never    Comment: married  Lifestyle  . Physical activity:    Days per week: Not on file    Minutes per session: Not on file  . Stress: Not on file  Relationships  .  Social connections:    Talks on phone: Not on file    Gets together: Not on file    Attends religious service: Not on file    Active member of club or organization: Not on file    Attends meetings of clubs or organizations: Not on file    Relationship status: Not on file  . Intimate partner violence:    Fear of current or ex partner: Not on file    Emotionally abused: Not on file    Physically abused: Not on file    Forced sexual activity: Not on file  Other Topics Concern  . Not on file  Social History Narrative   [facility]   Married   Pacemaker   POA   Never smoked   Alcohol-wine 2-3 nights a week    Exercise - exercise classes 3 days a week    [employer] [facility] at [facility]      [place] [facility]:   Originally from [place]. Has lived in [place], [place], [place], & moved to [place] in 1961. 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 No bird exposure.     Functional Status Survey:    Family History  Problem Relation Age of Onset  . Arthritis Father   . Heart disease Father   . Cancer Brother        ?spine  . Heart disease Brother   . Lung cancer Brother   . Diabetes Son   . Heart disease Mother        Congestive heart failure  . Heart disease Brother   . Arthritis Brother   . Hypertension Brother   . Colon cancer Neg Hx     Health Maintenance  Topic Date Due  . INFLUENZA VACCINE  02/09/2018  . TETANUS/TDAP  11/04/2025  . DEXA SCAN  Completed  . PNA vac Low Risk Adult  Completed    Allergies  Allergen Reactions  . Actonel [Risedronate Sodium] Other (See Comments)    Joint aches; rechallenged --caused joint aches  . Ivp Dye [Iodinated Diagnostic Agents] Nausea And Vomiting    Outpatient Encounter Medications as of 01/25/2018  Medication Sig  . acetaminophen (TYLENOL) 325 MG tablet Take 650 mg by mouth as needed for moderate pain (arthritis pain). Reported on 08/18/2015  . ascorbic acid  (VITAMIN C) 500 MG tablet Take 500 mg by mouth daily. Reported on 08/26/2015  . Calcium Carbonate (CALCIUM 600 PO) Take 600 mg by mouth daily.  . Cholecalciferol (VITAMIN D-3) 1000 units CAPS  Take 1,000 Units by mouth daily.   Marland Kitchen co-enzyme Q-10 30 MG capsule Take 30 mg by mouth daily.  . furosemide (LASIX) 20 MG tablet Take 20 mg by mouth as needed (swelling).  Marland Kitchen ibuprofen (ADVIL,MOTRIN) 200 MG tablet Take 400 mg by mouth every 6 (six) hours as needed for moderate pain.  . Omega 3 1200 MG CAPS Take 1,200 mg by mouth at bedtime.   Marland Kitchen PREMARIN vaginal cream Apply 1 Applicatorful topically 2 (two) times a week.   . ranitidine (ZANTAC) 150 MG tablet Take 150 mg by mouth 2 (two) times daily. Take one tablet in morning and one in evening  . [DISCONTINUED] ibuprofen (ADVIL,MOTRIN) 800 MG tablet Take 800 mg by mouth 3 (three) times daily. for pain  . traMADol (ULTRAM) 50 MG tablet Take 1 tablet (50 mg total) by mouth every 6 (six) hours as needed. (Patient not taking: Reported on 01/25/2018)   No facility-administered encounter medications on file as of 01/25/2018.     Review of Systems  Constitutional: Negative for appetite change, chills, diaphoresis, fatigue and fever.  HENT: Positive for trouble swallowing. Negative for congestion, ear discharge, ear pain, mouth sores, postnasal drip, rhinorrhea, sinus pressure, sinus pain and sore throat.   Eyes: Positive for visual disturbance. Negative for pain, redness and itching.       S/p cataract surgery in 2000  Respiratory: Negative for cough, shortness of breath and wheezing.   Cardiovascular: Positive for leg swelling. Negative for chest pain and palpitations.       Has leg swelling more towards end of day. Follows with cardiology  Gastrointestinal: Negative for abdominal pain, blood in stool, constipation, diarrhea, nausea and vomiting.  Genitourinary: Negative for dysuria, flank pain, frequency, hematuria, urgency and vaginal discharge.       Has  urinary incontinence at times. Followed by Gyn  Musculoskeletal: Negative for back pain, gait problem and myalgias.       Had a fall more than 6 months back by losing her balance, denies injury, no fall since then, does not use assistive device  Skin: Negative for rash and wound.  Neurological: Negative for dizziness, tremors, seizures, weakness, numbness and headaches.  Hematological: Does not bruise/bleed easily.  Psychiatric/Behavioral: Negative for behavioral problems, confusion, dysphoric mood, self-injury, sleep disturbance and suicidal ideas. The patient is not nervous/anxious.     Vitals:   01/25/18 1001  BP: 128/62  Pulse: 80  Resp: 18  Temp: (!) 97.5 F (36.4 C)  TempSrc: Oral  SpO2: 98%  Weight: 145 lb 6.4 oz (66 kg)  Height: 5' (1.524 m)   Body mass index is 28.4 kg/m.   Wt Readings from Last 3 Encounters:  01/25/18 145 lb 6.4 oz (66 kg)  10/25/17 145 lb (65.8 kg)  10/11/17 146 lb (66.2 kg)   Physical Exam  Constitutional: She is oriented to person, place, and time. No distress.  Overweight elderly female  HENT:  Head: Normocephalic and atraumatic.  Right Ear: External ear normal.  Left Ear: External ear normal.  Nose: Nose normal.  Mouth/Throat: Oropharynx is clear and moist. No oropharyngeal exudate.  Eyes: Pupils are equal, round, and reactive to light. Conjunctivae and EOM are normal. Right eye exhibits no discharge. Left eye exhibits no discharge. No scleral icterus.  Neck: Normal range of motion. Neck supple.  Cardiovascular: Normal rate, regular rhythm and intact distal pulses.  Pulmonary/Chest: Effort normal and breath sounds normal. No respiratory distress. She has no wheezes. She has no rales.  Abdominal:  Soft. Bowel sounds are normal. There is no tenderness. There is no guarding. A hernia is present.  Right inguinal hernia size of tennis ball, non tender, partly reducible  Musculoskeletal: Normal range of motion. She exhibits edema. She exhibits no  tenderness.  Limping gait, scoliosis present, no spinal tenderness  Lymphadenopathy:    She has no cervical adenopathy.  Neurological: She is alert and oriented to person, place, and time. No sensory deficit. She exhibits normal muscle tone.  Skin: Skin is warm and dry. Capillary refill takes less than 2 seconds. She is not diaphoretic.  Varicose veins present  Psychiatric: She has a normal mood and affect. Her behavior is normal.    Labs reviewed: Basic Metabolic Panel: Recent Labs    09/05/17 0805  NA 142  K 4.0  CL 105  CO2 30  GLUCOSE 108*  BUN 28*  CREATININE 0.67  CALCIUM 9.9   Liver Function Tests: Recent Labs    09/05/17 0805  AST 15  ALT 9  BILITOT 0.5  PROT 6.7   No results for input(s): LIPASE, AMYLASE in the last 8760 hours. No results for input(s): AMMONIA in the last 8760 hours. CBC: Recent Labs    09/05/17 0805  WBC 4.7  NEUTROABS 2,397  HGB 12.9  HCT 37.4  MCV 96.6  PLT 120*   Cardiac Enzymes: No results for input(s): CKTOTAL, CKMB, CKMBINDEX, TROPONINI in the last 8760 hours. BNP: Invalid input(s): POCBNP Lab Results  Component Value Date   HGBA1C 5.2 12/15/2016   Lab Results  Component Value Date   TSH 0.80 12/15/2016   No results found for: VITAMINB12 No results found for: FOLATE No results found for: IRON, TIBC, FERRITIN  Lipid Panel: Recent Labs    09/05/17 0805  CHOL 162  HDL 54  LDLCALC 93  TRIG 67  CHOLHDL 3.0   Lab Results  Component Value Date   HGBA1C 5.2 12/15/2016    Procedures since last visit: No results found.  Assessment/Plan  1. Hyperglycemia, unspecified Pt watches her diet. Activity as tolerated. Check a1c - CMP with eGFR([facility]); Future - CBC (no diff); Future - Hemoglobin A1c; Future - Hemoglobin A1c; Future  2. Non-sustained ventricular tachycardia (HCC) Stable. F/b cardiology, monitor  3. Obstructive sleep apnea syndrome Continue CPAP.  - CMP with eGFR([facility]); Future - CBC (no diff);  Future - TSH; Future  4. Achalasia of esophagus Continue ranitidne 150 mg bid and monitor  5. Multinodular thyroid - TSH; Future - TSH; Future  6. Atrophic vaginitis Continue premarin cream  7. Right inguinal hernia - Ambulatory referral to General Surgery  8. Mixed incontinence urge and stress Perineal skin care, follow with gyn  9. PVD Has varicose veins, trace edema, keep legs elevated at rest, advised to wear compression stocking, lasix 20 mg daily as needed.   MMSE - Mini Mental State Exam 08/31/2017 05/13/2017 07/06/2016  Not completed: (No Data) - -  Orientation to time _0 Orientation to Place _1 Registration _2 Attention/ Calculation _3 Recall _4 Language- name 2 objects _5 Language- repeat _6 Language- follow 3 step command _7 Language- read & follow direction _8 Write a sentence _9 Copy design _10 Total score _11 9. Goals of care counselling Last ACP Note 10/27/2017 to 01/25/2018  ACP (Advance Care Planning) by Blanchie Serve, MD at 01/25/2018 10:00 AM    Date of Service   Author Author Type Status Note Type File Time  01/25/2018 Addend Blanchie Serve, MD Physician Signed ACP (Advance Care Planning) 01/25/2018              Goals of care discussion Reviewed goals of care with patient with patient and myself present. Reviewed current documents. Patient has a living will and HCPOA paperwork. copies of it present in chart. Patient has a DNR form filled out today. MOST form reviewed and filled out today. Patient would like to remain a DNR in absence of pulse or breath. Patient would like hospitalization with limited interventions if needed. Patient agrees to iv fluids if indicated. Patient would like antibiotic if indicated. Patient wants a feeding tube for nutritional support for a defined trial period if needed. Form filled out and reviewed and signed. I have spent time from 10:40 am to 11 am reviewing goals of  care today. All questions from patient and/or family member have been answered to the best of my knowledge.                  Labs/tests ordered:   Lab Orders     CMP with eGFR([facility])     CBC (no diff)     TSH     Hemoglobin A1c     Hemoglobin A1c     TSH   Next appointment: 6 months   Communication: reviewed care plan with patient     Blanchie Serve, MD Internal Medicine [place], [place] [address] Cell Phone (Monday-Friday 8 am - 5 pm): [phone] On Call: [phone] and follow prompts after 5 pm and on weekends Office Phone: [phone] Office Fax: [phone]

## 2018-01-25 NOTE — ACP (Advance Care Planning) (Signed)
  Goals of care discussion Reviewed goals of care with patient with patient and myself present. Reviewed current documents. Patient has a living will and HCPOA paperwork. copies of it present in chart. Patient has a DNR form filled out today. MOST form reviewed and filled out today. Patient would like to remain a DNR in absence of pulse or breath. Patient would like hospitalization with limited interventions if needed. Patient agrees to iv fluids if indicated. Patient would like antibiotic if indicated. Patient wants a feeding tube for nutritional support for a defined trial period if needed. Form filled out and reviewed and signed. I have spent time from 10:40 am to 11 am reviewing goals of care today. All questions from patient and/or family member have been answered to the best of my knowledge.

## 2018-01-25 NOTE — Patient Instructions (Signed)
You will get a call from office about appointment with general surgery.  Make sure to see your eye doctor once a year.

## 2018-02-06 DIAGNOSIS — R739 Hyperglycemia, unspecified: Secondary | ICD-10-CM

## 2018-02-06 DIAGNOSIS — E042 Nontoxic multinodular goiter: Secondary | ICD-10-CM | POA: Diagnosis not present

## 2018-02-07 LAB — HEMOGLOBIN A1C
Hgb A1c MFr Bld: 5.4 % of total Hgb (ref <5.7)
Mean Plasma Glucose: 108 (calc)
eAG (mmol/L): 6 (calc)

## 2018-02-07 LAB — TSH: TSH: 0.98 mIU/L (ref 0.40–4.50)

## 2018-02-22 ENCOUNTER — Other Ambulatory Visit: Payer: Self-pay | Admitting: Surgery

## 2018-02-22 ENCOUNTER — Encounter: Payer: Self-pay | Admitting: Internal Medicine

## 2018-02-22 DIAGNOSIS — K4090 Unilateral inguinal hernia, without obstruction or gangrene, not specified as recurrent: Secondary | ICD-10-CM | POA: Diagnosis not present

## 2018-03-08 ENCOUNTER — Other Ambulatory Visit: Payer: Self-pay

## 2018-03-08 DIAGNOSIS — R739 Hyperglycemia, unspecified: Secondary | ICD-10-CM

## 2018-03-08 DIAGNOSIS — E042 Nontoxic multinodular goiter: Secondary | ICD-10-CM

## 2018-03-08 DIAGNOSIS — G4733 Obstructive sleep apnea (adult) (pediatric): Secondary | ICD-10-CM

## 2018-04-24 ENCOUNTER — Telehealth: Payer: Self-pay | Admitting: Cardiology

## 2018-04-24 ENCOUNTER — Ambulatory Visit (INDEPENDENT_AMBULATORY_CARE_PROVIDER_SITE_OTHER): Payer: Medicare Other | Admitting: *Deleted

## 2018-04-24 DIAGNOSIS — I495 Sick sinus syndrome: Secondary | ICD-10-CM

## 2018-04-24 NOTE — Telephone Encounter (Signed)
Spoke with pt and reminded pt of remote transmission that is due today. Pt verbalized understanding.   

## 2018-04-24 NOTE — Progress Notes (Signed)
Remote pacemaker transmission.   

## 2018-04-27 ENCOUNTER — Encounter: Payer: Self-pay | Admitting: Cardiology

## 2018-05-15 ENCOUNTER — Telehealth: Payer: Self-pay | Admitting: Family

## 2018-05-15 NOTE — Telephone Encounter (Signed)
I left a message asking the patient to call me at [phone] to schedule AWV with Clarise Cruz at [facility] on afternoon of 05/24/18 if available. VDM (DD)

## 2018-05-17 ENCOUNTER — Ambulatory Visit (INDEPENDENT_AMBULATORY_CARE_PROVIDER_SITE_OTHER): Payer: Medicare Other | Admitting: Gastroenterology

## 2018-05-17 ENCOUNTER — Encounter: Payer: Self-pay | Admitting: Gastroenterology

## 2018-05-17 VITALS — BP 96/52 | HR 84 | Ht 60.0 in | Wt 141.6 lb

## 2018-05-17 DIAGNOSIS — R1310 Dysphagia, unspecified: Secondary | ICD-10-CM | POA: Diagnosis not present

## 2018-05-17 DIAGNOSIS — R1319 Other dysphagia: Secondary | ICD-10-CM

## 2018-05-17 DIAGNOSIS — K220 Achalasia of cardia: Secondary | ICD-10-CM | POA: Diagnosis not present

## 2018-05-17 NOTE — Patient Instructions (Addendum)
You have been scheduled for a Barium Esophogram at [facility] (1st floor of the hospital) on 06/06/2018 at 9:30am . Please arrive 15 minutes prior to your appointment for registration. Make certain not to have anything to eat or drink 3 hours prior to your test. If you need to reschedule for any reason, please contact radiology at [phone] to do so. __________________________________________________________________ A barium swallow is an examination that concentrates on views of the esophagus. This tends to be a double contrast exam (barium and two liquids which, when combined, create a gas to distend the wall of the oesophagus) or single contrast (non-ionic iodine based). The study is usually tailored to your symptoms so a good history is essential. Attention is paid during the study to the form, structure and configuration of the esophagus, looking for functional disorders (such as aspiration, dysphagia, achalasia, motility and reflux) EXAMINATION You may be asked to change into a gown, depending on the type of swallow being performed. A radiologist and radiographer will perform the procedure. The radiologist will advise you of the type of contrast selected for your procedure and direct you during the exam. You will be asked to stand, sit or lie in several different positions and to hold a small amount of fluid in your mouth before being asked to swallow while the imaging is performed .In some instances you may be asked to swallow barium coated marshmallows to assess the motility of a solid food bolus. The exam can be recorded as a digital or video fluoroscopy procedure. POST PROCEDURE It will take 1-2 days for the barium to pass through your system. To facilitate this, it is important, unless otherwise directed, to increase your fluids for the next 24-48hrs and to resume your normal diet.  This test typically takes about 30 minutes to  perform. ______________________________________________________________________________  [facility] Endoscopy has been scheduled on 08/02/2018 at 12:30pm you will be sent to radiology after your endoscopy for a radiology test 2 hours after endoscopy

## 2018-05-19 LAB — CUP PACEART REMOTE DEVICE CHECK
Battery Remaining Longevity: 21 mo
Brady Statistic AP VP Percent: 5 %
Brady Statistic AS VP Percent: 0 %
Implantable Lead Implant Date: 20001227
Implantable Lead Model: 5076
Implantable Lead Model: 5076
Lead Channel Impedance Value: 405 Ohm
Lead Channel Impedance Value: 535 Ohm
Lead Channel Pacing Threshold Amplitude: 0.5 V
Lead Channel Pacing Threshold Amplitude: 0.625 V
Lead Channel Setting Pacing Amplitude: 2 V
Lead Channel Setting Pacing Amplitude: 2.5 V
MDC IDC LEAD IMPLANT DT: 20001227
MDC IDC LEAD LOCATION: 753859
MDC IDC LEAD LOCATION: 753860
MDC IDC MSMT BATTERY IMPEDANCE: 3115 Ohm
MDC IDC MSMT BATTERY VOLTAGE: 2.72 V
MDC IDC MSMT LEADCHNL RA PACING THRESHOLD PULSEWIDTH: 0.4 ms
MDC IDC MSMT LEADCHNL RV PACING THRESHOLD PULSEWIDTH: 0.4 ms
MDC IDC PG IMPLANT DT: 20090603
MDC IDC SESS DTM: 20191014153730
MDC IDC SET LEADCHNL RV PACING PULSEWIDTH: 0.4 ms
MDC IDC SET LEADCHNL RV SENSING SENSITIVITY: 2.8 mV
MDC IDC STAT BRADY AP VS PERCENT: 17 %
MDC IDC STAT BRADY AS VS PERCENT: 78 %

## 2018-05-22 NOTE — Progress Notes (Signed)
[name]    [id number]    April 21, 1929  Primary Care Physician:Heffner, Teodoro Spray, MD  Referring Physician: Blanchie Serve, MD No address on file  Chief complaint: Dysphagia, achalasia HPI: 84 year old female with history of achalasia status post pneumatic balloon dilation X2 with 30 mm and 35 mm pneumatic balloon care with complaints of recurrent dysphagia and regurgitation.  She was doing well for about 6 months after last dilation and she is noticing that she is having progressive episodes intermittently and she also is also having halitosis.  She had an episode of regurgitation and laryngospasm couple weeks ago while she is on vacation with her family.  Outpatient Encounter Medications as of 05/17/2018  Medication Sig  . acetaminophen (TYLENOL) 325 MG tablet Take 650 mg by mouth as needed for moderate pain (arthritis pain). Reported on 08/18/2015  . AMBULATORY NON FORMULARY MEDICATION Medication Name: ultra zyme- Take 1 capsule twice daily  . ascorbic acid (VITAMIN C) 500 MG tablet Take 500 mg by mouth daily. Reported on 08/26/2015  . Calcium Carbonate (CALCIUM 600 PO) Take 600 mg by mouth daily.  . Cholecalciferol (VITAMIN D-3) 1000 units CAPS Take 1,000 Units by mouth daily.   Marland Kitchen co-enzyme Q-10 30 MG capsule Take 30 mg by mouth daily.  . furosemide (LASIX) 20 MG tablet Take 20 mg by mouth as needed (swelling).  Marland Kitchen ibuprofen (ADVIL,MOTRIN) 200 MG tablet Take 400 mg by mouth every 6 (six) hours as needed for moderate pain.  . Omega 3 1200 MG CAPS Take 1,200 mg by mouth at bedtime.   Marland Kitchen PREMARIN vaginal cream Apply 1 Applicatorful topically 2 (two) times a week.   . [DISCONTINUED] ranitidine (ZANTAC) 150 MG tablet Take 150 mg by mouth 2 (two) times daily. Take one tablet in morning and one in evening   No facility-administered encounter medications on file as of 05/17/2018.     Allergies as of 05/17/2018 - Review Complete 05/17/2018  Allergen Reaction Noted  .  Actonel [risedronate sodium] Other (See Comments) 06/19/2012  . Ivp dye [iodinated diagnostic agents] Nausea And Vomiting 06/19/2012    Past Medical History:  Diagnosis Date  . Achalasia   . Anemia   . Arthritis    back  . Chronic steroid use   . Complete heart block ([place])    S/P PACEMAKER 2000 W/ GENERATOR CHANGE 2009  . Empyema, right ([place]) PULMOLOGIST-- DR CLANCE   VATS 06/23/2012 cultures negative to date CXR 07/19/12 persistent airfluid levels/  CXR 11-01-2012 IMPROVE RIGHT PLEURAL EFFUSION  . GERD (gastroesophageal reflux disease)   . History of aspiration pneumonitis    DEC 2013  . History of hiatal hernia   . Hypertension   . Inguinal hernia    right  . Intrinsic urethral sphincter deficiency   . Megaesophagus   . Mixed stress and urge urinary incontinence   . Multinodular thyroid 06/26/2012   Multi nodular goiter. Large nodules in both lobes of the gland.  These nodules fit national criteria for fine needle aspiration  biopsy if not previously assessed.    . OSA on CPAP    cpap, doees not know settings  . Polymyalgia rheumatica (HCC)    on chronic Prednisone 5mg  daily  . RBBB   . S/P dilatation of esophageal stricture     Past Surgical History:  Procedure Laterality Date  . APPENDECTOMY  1953   w/ removal benign kidney tumor   . BALLOON DILATION  07/24/2012   Procedure: BALLOON DILATION;  Surgeon: Inda Castle, MD;  Location: [facility];  Service: Endoscopy;  Laterality: N/A;  . BALLOON DILATION N/A 12/03/2015   Procedure: BALLOON DILATION;  Surgeon: Mauri Pole, MD;  Location: [facility];  Service: Endoscopy;  Laterality: N/A;  pnuematic balloon  . BALLOON DILATION N/A 03/16/2017   Procedure: BALLOON DILATION;  Surgeon: Mauri Pole, MD;  Location: [facility];  Service: Endoscopy;  Laterality: N/A;  PNUEMATIC BALLOONS  . BOTOX INJECTION  08/07/2012   Procedure: BOTOX INJECTION;  Surgeon: Inda Castle, MD;  [facility]: [facility];  Service:  Endoscopy;  Laterality: N/A;  . BOTOX INJECTION N/A 05/24/2013   Procedure: MACROPLASTIQUE IMPLANT;  Surgeon: Irine Seal, MD;  Location: [facility];  Service: Urology;  Laterality: N/A;  . BOTOX INJECTION  02/25/2014   Procedure: BOTOX INJECTION;  Surgeon: Inda Castle, MD;  [facility];  Service: Endoscopy;;  . CARDIAC PACEMAKER PLACEMENT  06/1999  DR RUTH GREENFIELD AT [place]  ( LAST PACER CHECK 05-09-2013) for CHB/   END-OF-LIFE GENERATOR CHANGE  2009  . CATARACT EXTRACTION W/ INTRAOCULAR LENS  IMPLANT, BILATERAL  2005  . DILATION AND CURETTAGE OF UTERUS    . ESOPHAGOGASTRODUODENOSCOPY  07/24/2012   Procedure: ESOPHAGOGASTRODUODENOSCOPY (EGD);  Surgeon: Inda Castle, MD;  Location: [facility];  Service: Endoscopy;  Laterality: N/A;  . ESOPHAGOGASTRODUODENOSCOPY  08/07/2012   Procedure: ESOPHAGOGASTRODUODENOSCOPY (EGD);  Surgeon: Inda Castle, MD;  Location: [facility];  Service: Endoscopy;  Laterality: N/A;  . ESOPHAGOGASTRODUODENOSCOPY N/A 02/25/2014   Procedure: ESOPHAGOGASTRODUODENOSCOPY (EGD);  Surgeon: Inda Castle, MD;  Location: [facility];  Service: Endoscopy;  Laterality: N/A;  . ESOPHAGOGASTRODUODENOSCOPY N/A 03/16/2017   Procedure: ESOPHAGOGASTRODUODENOSCOPY (EGD);  Surgeon: Mauri Pole, MD;  Location: [facility];  Service: Endoscopy;  Laterality: N/A;  . ESOPHAGOGASTRODUODENOSCOPY (EGD) WITH ESOPHAGEAL DILATION  06/27/2012   Procedure: ESOPHAGOGASTRODUODENOSCOPY (EGD) WITH ESOPHAGEAL DILATION;  Surgeon: Ladene Artist, MD,FACG;  Location: [facility];  Service: Endoscopy;  Laterality: N/A;  . ESOPHAGOGASTRODUODENOSCOPY (EGD) WITH PROPOFOL N/A 10/09/2015   Procedure: ESOPHAGOGASTRODUODENOSCOPY (EGD) WITH PROPOFOL ( WITH BOTOX);  Surgeon: Milus Banister, MD;  Location: [facility];  Service: Endoscopy;  Laterality: N/A;  . ESOPHAGOGASTRODUODENOSCOPY (EGD) WITH PROPOFOL N/A 12/03/2015   Procedure:  ESOPHAGOGASTRODUODENOSCOPY (EGD) WITH PROPOFOL;  Surgeon: Mauri Pole, MD;  Location: [facility];  Service: Endoscopy;  Laterality: N/A;  . PACEMAKER GENERATOR CHANGE  12/13/2007   at [facility]  . TOE SURGERY  2013   left 3rd toe HAMMERTOE REPAIR  . TONSILLECTOMY  AS CHILD  . TOTAL HIP ARTHROPLASTY Right 2001  . VERICOSE VEIN LIGATION    . VIDEO ASSISTED THORACOSCOPY (VATS)/DECORTICATION  06/23/2012   Procedure: VIDEO ASSISTED THORACOSCOPY (VATS)/DECORTICATION;  Surgeon: Grace Isaac, MD;  Location: [facility];  Service: Thoracic;  Laterality: Right;  Marland Kitchen VIDEO ASSISTED THORACOSCOPY (VATS)/EMPYEMA     06/23/2012  . VIDEO BRONCHOSCOPY  06/23/2012   Procedure: VIDEO BRONCHOSCOPY;  Surgeon: Grace Isaac, MD;  Location: [facility] OR;  Service: Thoracic;  Laterality: N/A;    Family History  Problem Relation Age of Onset  . Arthritis Father   . Heart disease Father   . Cancer Brother        ?spine  . Heart disease Brother   . Lung cancer Brother   . Diabetes Son   . Heart disease Mother        Congestive heart failure  . Heart  disease Brother   . Arthritis Brother   . Hypertension Brother   . Colon cancer Neg Hx     Social History   Socioeconomic History  . Marital status: Married    Spouse name: Not on file  . Number of children: 48  . Years of education: Not on file  . Highest education level: Not on file  Occupational History  . Occupation: Retired Regulatory affairs officer Needs  . Financial resource strain: Not on file  . Food insecurity:    Worry: Not on file    Inability: Not on file  . Transportation needs:    Medical: Not on file    Non-medical: Not on file  Tobacco Use  . Smoking status: Passive Smoke Exposure - Never Smoker  . Smokeless tobacco: Never Used  . Tobacco comment: Exposure through father only.  Substance and Sexual Activity  . Alcohol use: Yes    Alcohol/week: 3.0 standard drinks    Types: 3 Glasses of wine per week    Comment: 1 drink x  3 days/week  . Drug use: No  . Sexual activity: Never    Comment: married  Lifestyle  . Physical activity:    Days per week: Not on file    Minutes per session: Not on file  . Stress: Not on file  Relationships  . Social connections:    Talks on phone: Not on file    Gets together: Not on file    Attends religious service: Not on file    Active member of club or organization: Not on file    Attends meetings of clubs or organizations: Not on file    Relationship status: Not on file  . Intimate partner violence:    Fear of current or ex partner: Not on file    Emotionally abused: Not on file    Physically abused: Not on file    Forced sexual activity: Not on file  Other Topics Concern  . Not on file  Social History Narrative   Lives at [facility] since 05/18/2012   Married   Pacemaker   POA   Never smoked   Alcohol-wine 2-3 nights a week    Exercise - exercise classes 3 days a week    [employer]ation at [facility]      [facility]:   Originally from [place]. Has lived in [place], [place], [place], & moved to [place] in 1961. 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 No bird exposure.       Review of systems: Review of Systems  Constitutional: Negative for fever and chills.  HENT: Negative.   Eyes: Negative for blurred vision.  Respiratory: Negative for cough, shortness of breath and wheezing.   Cardiovascular: Negative for chest pain and palpitations.  Gastrointestinal: as per HPI Genitourinary: Negative for dysuria, urgency, frequency and hematuria.  Musculoskeletal: Negative for myalgias, back pain and joint pain.  Skin: Negative for itching and rash.  Neurological: Negative for dizziness, tremors, focal weakness, seizures and loss of consciousness.  Endo/Heme/Allergies: Negative for seasonal allergies.  Psychiatric/Behavioral: Negative for depression, suicidal ideas and hallucinations.  All other systems reviewed  and are negative.   Physical Exam: Vitals:   05/17/18 1439  BP: (!) 96/52  Pulse: 84   Body mass index is 27.65 kg/m. Gen:      No acute distress HEENT:  EOMI, sclera anicteric Neck:     No masses; no thyromegaly Lungs:  Clear to auscultation bilaterally; normal respiratory effort CV:         Regular rate and rhythm; no murmurs Abd:      + bowel sounds; soft, non-tender; no palpable masses, no distension Ext:    No edema; adequate peripheral perfusion Skin:      Warm and dry; no rash Neuro: alert and oriented x 3 Psych: normal mood and affect  Data Reviewed:  Reviewed labs, radiology imaging, old records and pertinent past GI work up   Assessment and Plan/Recommendations:  84 year old female with long-standing history of achalasia, severely dilated tortuous esophagus and large epiphrenic and mid esophageal diverticula with complaints of recurrent dysphagia and regurgitation Schedule for barium esophagram to assess the flow through EG junction Discussed dietary modification and antireflux measures in detail EGD with repeat pneumatic balloon dilation to be scheduled at [facility] Discussed in detail potential risks including bleeding, infection, aspiration and esophageal perforation. It will be done generation anaesthesia, intubated. Post procedure Gastrografin study to exclude any leaks or perforation She can be discharge home same day after gastrografin study  40 minutes was spent face-to-face with the patient. Greater than 50% of the time used for counseling as well as treatment plan and follow-up. She had multiple questions which were answered to her satisfaction  K. Denzil Magnuson , MD [phone]    CC: Blanchie Serve, MD

## 2018-05-30 ENCOUNTER — Encounter: Payer: Self-pay | Admitting: Gastroenterology

## 2018-06-01 ENCOUNTER — Other Ambulatory Visit: Payer: Self-pay

## 2018-06-06 ENCOUNTER — Ambulatory Visit (HOSPITAL_COMMUNITY)
Admission: RE | Admit: 2018-06-06 | Discharge: 2018-06-06 | Disposition: A | Discharge status: Home / Self Care | Payer: Medicare Other | Source: Ambulatory Visit | Attending: Gastroenterology | Admitting: Gastroenterology

## 2018-06-06 DIAGNOSIS — R1319 Other dysphagia: Secondary | ICD-10-CM

## 2018-06-06 DIAGNOSIS — K220 Achalasia of cardia: Secondary | ICD-10-CM | POA: Diagnosis not present

## 2018-06-06 DIAGNOSIS — R1310 Dysphagia, unspecified: Secondary | ICD-10-CM | POA: Diagnosis not present

## 2018-06-19 ENCOUNTER — Telehealth: Payer: Self-pay | Admitting: Gastroenterology

## 2018-06-20 NOTE — Telephone Encounter (Signed)
DG esophagus discussed with the patient

## 2018-07-19 ENCOUNTER — Encounter: Payer: Self-pay | Admitting: Internal Medicine

## 2018-07-19 ENCOUNTER — Non-Acute Institutional Stay: Payer: Medicare Other | Admitting: Internal Medicine

## 2018-07-19 VITALS — BP 134/76 | HR 92 | Temp 97.5°F | Ht 60.0 in | Wt 144.6 lb

## 2018-07-19 DIAGNOSIS — M545 Low back pain, unspecified: Secondary | ICD-10-CM

## 2018-07-19 DIAGNOSIS — R600 Localized edema: Secondary | ICD-10-CM | POA: Diagnosis not present

## 2018-07-19 MED ORDER — FUROSEMIDE 20 MG PO TABS: 20.0000 mg | ORAL_TABLET | ORAL | 2 refills | Status: DC | PRN

## 2018-07-19 MED ORDER — METHOCARBAMOL 500 MG PO TABS: 500.0000 mg | ORAL_TABLET | Freq: Every day | ORAL | 0 refills | Status: DC

## 2018-07-20 NOTE — Progress Notes (Signed)
Location: [place] of Service:  Clinic (12)  Provider:   Code Status:  Goals of Care:  Advanced Directives 07/19/2018  Does Patient Have a Medical Advance Directive? Yes  Type of Paramedic of [place];Out of facility DNR (pink MOST or yellow form)  Does patient want to make changes to medical advance directive? No - Patient declined  Copy of [place] in Chart? Yes - validated most recent copy scanned in chart (See row information)  Would patient like information on creating a medical advance directive? -  Pre-existing out of facility DNR order (yellow form or pink MOST form) Yellow form placed in chart (order not valid for inpatient use);Pink MOST form placed in chart (order not valid for inpatient use)     Chief Complaint  Patient presents with  . Acute Visit    pain in back on right side for 1 week     HPI: Patient is a 85 y.o. female seen today for an acute visit for Pain in her right side in the back. Patient with h/o Dysphagia,Sick Sinus S/P Cardiac pacemaker She was seen today in the Clinic for Right sided back Pain. She denies any h/o Fall. The pain is worse at night when she is trying to relax. Denies any Chest pain , Cough , Fever SOB She does have swelling in her legs which is chronic   Past Medical History:  Diagnosis Date  . Achalasia   . Anemia   . Arthritis    back  . Chronic steroid use   . Complete heart block ([place])    S/P PACEMAKER 2000 W/ GENERATOR CHANGE 2009  . Empyema, right ([place]) PULMOLOGIST-- DR CLANCE   VATS 06/23/2012 cultures negative to date CXR 07/19/12 persistent airfluid levels/  CXR 11-01-2012 IMPROVE RIGHT PLEURAL EFFUSION  . GERD (gastroesophageal reflux disease)   . History of aspiration pneumonitis    DEC 2013  . History of hiatal hernia   . Hypertension   . Inguinal hernia    right  . Intrinsic urethral sphincter deficiency   . Megaesophagus   . Mixed stress and urge  urinary incontinence   . Multinodular thyroid 06/26/2012   Multi nodular goiter. Large nodules in both lobes of the gland.  These nodules fit national criteria for fine needle aspiration  biopsy if not previously assessed.    . OSA on CPAP    cpap, doees not know settings  . Polymyalgia rheumatica (HCC)    on chronic Prednisone 5mg  daily  . RBBB   . S/P dilatation of esophageal stricture     Past Surgical History:  Procedure Laterality Date  . APPENDECTOMY  1953   w/ removal benign kidney tumor   . BALLOON DILATION  07/24/2012   Procedure: BALLOON DILATION;  Surgeon: Inda Castle, MD;  Location: [facility];  Service: Endoscopy;  Laterality: N/A;  . BALLOON DILATION N/A 12/03/2015   Procedure: BALLOON DILATION;  Surgeon: Mauri Pole, MD;  Location: [facility];  Service: Endoscopy;  Laterality: N/A;  pnuematic balloon  . BALLOON DILATION N/A 03/16/2017   Procedure: BALLOON DILATION;  Surgeon: Mauri Pole, MD;  Location: [facility];  Service: Endoscopy;  Laterality: N/A;  PNUEMATIC BALLOONS  . BOTOX INJECTION  08/07/2012   Procedure: BOTOX INJECTION;  Surgeon: Inda Castle, MD;  [facility];  Service: Endoscopy;  Laterality: N/A;  . BOTOX INJECTION N/A 05/24/2013   Procedure: MACROPLASTIQUE IMPLANT;  Surgeon: Jenny Reichmann  Jeffie Pollock, MD;  Location: [facility];  Service: Urology;  Laterality: N/A;  . BOTOX INJECTION  02/25/2014   Procedure: BOTOX INJECTION;  Surgeon: Inda Castle, MD;  [facility];  Service: Endoscopy;;  . CARDIAC PACEMAKER PLACEMENT  06/1999  DR RUTH GREENFIELD AT [facility]  ( LAST PACER CHECK 05-09-2013) for CHB/   END-OF-LIFE GENERATOR CHANGE  2009  . CATARACT EXTRACTION W/ INTRAOCULAR LENS  IMPLANT, BILATERAL  2005  . DILATION AND CURETTAGE OF UTERUS    . ESOPHAGOGASTRODUODENOSCOPY  07/24/2012   Procedure: ESOPHAGOGASTRODUODENOSCOPY (EGD);  Surgeon: Inda Castle, MD;  Location: [facility];  Service:  Endoscopy;  Laterality: N/A;  . ESOPHAGOGASTRODUODENOSCOPY  08/07/2012   Procedure: ESOPHAGOGASTRODUODENOSCOPY (EGD);  Surgeon: Inda Castle, MD;  Location: [facility];  Service: Endoscopy;  Laterality: N/A;  . ESOPHAGOGASTRODUODENOSCOPY N/A 02/25/2014   Procedure: ESOPHAGOGASTRODUODENOSCOPY (EGD);  Surgeon: Inda Castle, MD;  Location: [facility];  Service: Endoscopy;  Laterality: N/A;  . ESOPHAGOGASTRODUODENOSCOPY N/A 03/16/2017   Procedure: ESOPHAGOGASTRODUODENOSCOPY (EGD);  Surgeon: Mauri Pole, MD;  Location: [facility];  Service: Endoscopy;  Laterality: N/A;  . ESOPHAGOGASTRODUODENOSCOPY (EGD) WITH ESOPHAGEAL DILATION  06/27/2012   Procedure: ESOPHAGOGASTRODUODENOSCOPY (EGD) WITH ESOPHAGEAL DILATION;  Surgeon: Ladene Artist, MD,FACG;  Location: [facility];  Service: Endoscopy;  Laterality: N/A;  . ESOPHAGOGASTRODUODENOSCOPY (EGD) WITH PROPOFOL N/A 10/09/2015   Procedure: ESOPHAGOGASTRODUODENOSCOPY (EGD) WITH PROPOFOL ( WITH BOTOX);  Surgeon: Milus Banister, MD;  Location: [facility];  Service: Endoscopy;  Laterality: N/A;  . ESOPHAGOGASTRODUODENOSCOPY (EGD) WITH PROPOFOL N/A 12/03/2015   Procedure: ESOPHAGOGASTRODUODENOSCOPY (EGD) WITH PROPOFOL;  Surgeon: Mauri Pole, MD;  Location: [facility];  Service: Endoscopy;  Laterality: N/A;  . PACEMAKER GENERATOR CHANGE  12/13/2007   at [facility]  . TOE SURGERY  2013   left 3rd toe HAMMERTOE REPAIR  . TONSILLECTOMY  AS CHILD  . TOTAL HIP ARTHROPLASTY Right 2001  . VERICOSE VEIN LIGATION    . VIDEO ASSISTED THORACOSCOPY (VATS)/DECORTICATION  06/23/2012   Procedure: VIDEO ASSISTED THORACOSCOPY (VATS)/DECORTICATION;  Surgeon: Grace Isaac, MD;  Location: [facility];  Service: Thoracic;  Laterality: Right;  Marland Kitchen VIDEO ASSISTED THORACOSCOPY (VATS)/EMPYEMA     06/23/2012  . VIDEO BRONCHOSCOPY  06/23/2012   Procedure: VIDEO BRONCHOSCOPY;  Surgeon: Grace Isaac, MD;  Location: [facility];  Service: Thoracic;  Laterality: N/A;     Allergies  Allergen Reactions  . Actonel [Risedronate Sodium] Other (See Comments)    Joint aches; rechallenged --caused joint aches  . Ivp Dye [Iodinated Diagnostic Agents] Nausea And Vomiting    Outpatient Encounter Medications as of 07/19/2018  Medication Sig  . AMBULATORY NON FORMULARY MEDICATION Medication Name: ultra zyme- Take 1 capsule twice daily  . ascorbic acid (VITAMIN C) 500 MG tablet Take 500 mg by mouth daily. Reported on 08/26/2015  . Calcium Carbonate (CALCIUM 600 PO) Take 600 mg by mouth daily.  . Cholecalciferol (VITAMIN D-3) 1000 units CAPS Take 1,000 Units by mouth daily.   Marland Kitchen co-enzyme Q-10 30 MG capsule Take 30 mg by mouth daily.  . furosemide (LASIX) 20 MG tablet Take 1 tablet (20 mg total) by mouth as needed (swelling).  Marland Kitchen ibuprofen (ADVIL,MOTRIN) 200 MG tablet Take 400 mg by mouth every 6 (six) hours as needed for moderate pain.  . Omega 3 1200 MG CAPS Take 1,200 mg by mouth at bedtime.   Marland Kitchen PREMARIN vaginal cream Apply 1 Applicatorful topically 2 (two) times a week.   . [DISCONTINUED]  furosemide (LASIX) 20 MG tablet Take 20 mg by mouth as needed (swelling).  Marland Kitchen acetaminophen (TYLENOL) 325 MG tablet Take 650 mg by mouth as needed for moderate pain (arthritis pain). Reported on 08/18/2015  . methocarbamol (ROBAXIN) 500 MG tablet Take 1 tablet (500 mg total) by mouth at bedtime.   No facility-administered encounter medications on file as of 07/19/2018.     Review of Systems:  Review of Systems  Constitutional: Negative.   HENT: Negative.   Respiratory: Negative.   Cardiovascular: Positive for leg swelling.  Gastrointestinal: Negative.   Genitourinary: Negative.   Musculoskeletal: Positive for myalgias.  Skin: Negative.   Neurological: Negative.   Psychiatric/Behavioral: Negative.     Health Maintenance  Topic Date Due  . TETANUS/TDAP  11/04/2025  . INFLUENZA VACCINE  Completed  . DEXA SCAN  Completed  . PNA vac Low Risk Adult  Completed    Physical  Exam: Vitals:   07/19/18 1611  BP: 134/76  Pulse: 92  Temp: (!) 97.5 F (36.4 C)  TempSrc: Oral  SpO2: 93%  Weight: 144 lb 9.6 oz (65.6 kg)  Height: 5' (1.524 m)   Body mass index is 28.24 kg/m. Physical Exam Constitutional:      Appearance: Normal appearance.  HENT:     Head: Normocephalic.     Nose: Nose normal.     Mouth/Throat:     Mouth: Mucous membranes are moist.     Pharynx: Oropharynx is clear.  Eyes:     Pupils: Pupils are equal, round, and reactive to light.  Cardiovascular:     Rate and Rhythm: Normal rate and regular rhythm.     Pulses: Normal pulses.     Heart sounds: Normal heart sounds.  Pulmonary:     Effort: Pulmonary effort is normal. No respiratory distress.     Breath sounds: Normal breath sounds. No wheezing.  Abdominal:     General: Abdomen is flat. Bowel sounds are normal.     Palpations: Abdomen is soft.  Musculoskeletal:     Comments: Moderate Edema Bilateral  Patient had mild tenderness in Right Lower back.below the Last Rib. No swelling or Rash noticed  Skin:    General: Skin is warm and dry.  Neurological:     General: No focal deficit present.     Mental Status: She is alert and oriented to person, place, and time.  Psychiatric:        Mood and Affect: Mood normal.        Thought Content: Thought content normal.     Labs reviewed: Basic Metabolic Panel: Recent Labs    09/05/17 0805 02/06/18 0000  NA 142  --   K 4.0  --   CL 105  --   CO2 30  --   GLUCOSE 108*  --   BUN 28*  --   CREATININE 0.67  --   CALCIUM 9.9  --   TSH  --  0.98   Liver Function Tests: Recent Labs    09/05/17 0805  AST 15  ALT 9  BILITOT 0.5  PROT 6.7   No results for input(s): LIPASE, AMYLASE in the last 8760 hours. No results for input(s): AMMONIA in the last 8760 hours. CBC: Recent Labs    09/05/17 0805  WBC 4.7  NEUTROABS 2,397  HGB 12.9  HCT 37.4  MCV 96.6  PLT 120*   Lipid Panel: Recent Labs    09/05/17 0805  CHOL 162   HDL 54  LDLCALC 93  TRIG 67  CHOLHDL 3.0   Lab Results  Component Value Date   HGBA1C 5.4 02/06/2018    Procedures since last visit: No results found.  Assessment/Plan Right Sided Back pain Non Specific D/W patient to avoid NSAIDS due to her age Will try Robaxin at night for 2 weeks Also Biofreeze Patches for few days At this time with no h/o Falls or any injury will treat conservatively Revalauate in few weeks if pain not better LE edema Patient said that she needed refill for her PRN lasix   Labs/tests ordered:   Next appt:  Follow up if Pain doesn't get better

## 2018-07-24 ENCOUNTER — Ambulatory Visit (INDEPENDENT_AMBULATORY_CARE_PROVIDER_SITE_OTHER): Payer: Medicare Other

## 2018-07-24 DIAGNOSIS — I495 Sick sinus syndrome: Secondary | ICD-10-CM | POA: Diagnosis not present

## 2018-07-25 LAB — CUP PACEART REMOTE DEVICE CHECK
Battery Impedance: 3256 Ohm
Battery Remaining Longevity: 20 mo
Battery Voltage: 2.71 V
Brady Statistic AP VP Percent: 5 %
Brady Statistic AP VS Percent: 18 %
Brady Statistic AS VP Percent: 0 %
Brady Statistic AS VS Percent: 76 %
Date Time Interrogation Session: 20200113161339
Implantable Lead Implant Date: 20001227
Implantable Lead Implant Date: 20001227
Implantable Lead Location: 753859
Implantable Lead Location: 753860
Implantable Lead Model: 5076
Implantable Lead Model: 5076
Implantable Pulse Generator Implant Date: 20090603
Lead Channel Impedance Value: 420 Ohm
Lead Channel Impedance Value: 606 Ohm
Lead Channel Pacing Threshold Amplitude: 0.5 V
Lead Channel Pacing Threshold Amplitude: 0.625 V
Lead Channel Pacing Threshold Pulse Width: 0.4 ms
Lead Channel Pacing Threshold Pulse Width: 0.4 ms
Lead Channel Setting Pacing Amplitude: 2 V
Lead Channel Setting Pacing Amplitude: 2.5 V
Lead Channel Setting Pacing Pulse Width: 0.4 ms
Lead Channel Setting Sensing Sensitivity: 4 mV

## 2018-07-25 NOTE — Progress Notes (Signed)
Remote pacemaker transmission.   

## 2018-07-26 ENCOUNTER — Other Ambulatory Visit: Payer: Self-pay | Admitting: Internal Medicine

## 2018-07-26 DIAGNOSIS — E042 Nontoxic multinodular goiter: Secondary | ICD-10-CM

## 2018-07-26 DIAGNOSIS — R739 Hyperglycemia, unspecified: Secondary | ICD-10-CM

## 2018-07-26 DIAGNOSIS — G4733 Obstructive sleep apnea (adult) (pediatric): Secondary | ICD-10-CM

## 2018-07-27 DIAGNOSIS — E042 Nontoxic multinodular goiter: Secondary | ICD-10-CM

## 2018-07-27 DIAGNOSIS — G4733 Obstructive sleep apnea (adult) (pediatric): Secondary | ICD-10-CM

## 2018-07-27 DIAGNOSIS — R739 Hyperglycemia, unspecified: Secondary | ICD-10-CM

## 2018-08-02 ENCOUNTER — Encounter (HOSPITAL_COMMUNITY)
Admission: RE | Disposition: A | Discharge status: Home / Self Care | Payer: Self-pay | Source: Home / Self Care | Attending: Gastroenterology

## 2018-08-02 ENCOUNTER — Other Ambulatory Visit: Payer: Self-pay

## 2018-08-02 ENCOUNTER — Ambulatory Visit (HOSPITAL_COMMUNITY): Admission: RE | Admit: 2018-08-02 | Payer: Medicare Other | Source: Ambulatory Visit

## 2018-08-02 ENCOUNTER — Ambulatory Visit (HOSPITAL_COMMUNITY)
Admission: RE | Admit: 2018-08-02 | Discharge: 2018-08-02 | Disposition: A | Discharge status: Home / Self Care | Payer: Medicare Other | Attending: Gastroenterology | Admitting: Gastroenterology

## 2018-08-02 ENCOUNTER — Ambulatory Visit (HOSPITAL_COMMUNITY): Payer: Medicare Other | Admitting: Anesthesiology

## 2018-08-02 ENCOUNTER — Encounter (HOSPITAL_COMMUNITY): Payer: Self-pay | Admitting: Certified Registered Nurse Anesthetist

## 2018-08-02 DIAGNOSIS — T18128A Food in esophagus causing other injury, initial encounter: Secondary | ICD-10-CM | POA: Diagnosis not present

## 2018-08-02 DIAGNOSIS — Q399 Congenital malformation of esophagus, unspecified: Secondary | ICD-10-CM | POA: Diagnosis not present

## 2018-08-02 DIAGNOSIS — I10 Essential (primary) hypertension: Secondary | ICD-10-CM | POA: Diagnosis not present

## 2018-08-02 DIAGNOSIS — T18108A Unspecified foreign body in esophagus causing other injury, initial encounter: Secondary | ICD-10-CM | POA: Diagnosis not present

## 2018-08-02 DIAGNOSIS — I442 Atrioventricular block, complete: Secondary | ICD-10-CM | POA: Insufficient documentation

## 2018-08-02 DIAGNOSIS — E785 Hyperlipidemia, unspecified: Secondary | ICD-10-CM | POA: Diagnosis not present

## 2018-08-02 DIAGNOSIS — M353 Polymyalgia rheumatica: Secondary | ICD-10-CM | POA: Insufficient documentation

## 2018-08-02 DIAGNOSIS — Z950 Presence of cardiac pacemaker: Secondary | ICD-10-CM | POA: Insufficient documentation

## 2018-08-02 DIAGNOSIS — Z96641 Presence of right artificial hip joint: Secondary | ICD-10-CM | POA: Insufficient documentation

## 2018-08-02 DIAGNOSIS — X58XXXA Exposure to other specified factors, initial encounter: Secondary | ICD-10-CM | POA: Diagnosis not present

## 2018-08-02 DIAGNOSIS — G4733 Obstructive sleep apnea (adult) (pediatric): Secondary | ICD-10-CM | POA: Diagnosis not present

## 2018-08-02 DIAGNOSIS — Z7952 Long term (current) use of systemic steroids: Secondary | ICD-10-CM | POA: Insufficient documentation

## 2018-08-02 DIAGNOSIS — K228 Other specified diseases of esophagus: Secondary | ICD-10-CM

## 2018-08-02 DIAGNOSIS — K220 Achalasia of cardia: Secondary | ICD-10-CM | POA: Diagnosis not present

## 2018-08-02 DIAGNOSIS — K225 Diverticulum of esophagus, acquired: Secondary | ICD-10-CM | POA: Diagnosis not present

## 2018-08-02 DIAGNOSIS — Z79899 Other long term (current) drug therapy: Secondary | ICD-10-CM | POA: Diagnosis not present

## 2018-08-02 DIAGNOSIS — G4730 Sleep apnea, unspecified: Secondary | ICD-10-CM | POA: Insufficient documentation

## 2018-08-02 DIAGNOSIS — R1310 Dysphagia, unspecified: Secondary | ICD-10-CM | POA: Insufficient documentation

## 2018-08-02 HISTORY — PX: FOREIGN BODY REMOVAL: SHX962

## 2018-08-02 HISTORY — PX: ESOPHAGOGASTRODUODENOSCOPY: SHX5428

## 2018-08-02 SURGERY — REMOVAL, FOREIGN BODY
Anesthesia: General

## 2018-08-02 MED ORDER — PROPOFOL 10 MG/ML IV BOLUS
INTRAVENOUS | Status: AC
  Filled 2018-08-02: qty 40

## 2018-08-02 MED ORDER — LACTATED RINGERS IV SOLN
INTRAVENOUS | Status: DC
  Administered 2018-08-02: 1000 mL via INTRAVENOUS

## 2018-08-02 MED ORDER — LIDOCAINE 2% (20 MG/ML) 5 ML SYRINGE
INTRAMUSCULAR | Status: DC | PRN
  Administered 2018-08-02: 60 mg via INTRAVENOUS

## 2018-08-02 MED ORDER — PROPOFOL 10 MG/ML IV BOLUS
INTRAVENOUS | Status: DC | PRN
  Administered 2018-08-02: 100 mg via INTRAVENOUS

## 2018-08-02 MED ORDER — SUCCINYLCHOLINE CHLORIDE 200 MG/10ML IV SOSY
PREFILLED_SYRINGE | INTRAVENOUS | Status: DC | PRN
  Administered 2018-08-02: 100 mg via INTRAVENOUS

## 2018-08-02 MED ORDER — ONDANSETRON HCL 4 MG/2ML IJ SOLN
INTRAMUSCULAR | Status: DC | PRN
  Administered 2018-08-02: 4 mg via INTRAVENOUS

## 2018-08-02 SURGICAL SUPPLY — 14 items

## 2018-08-02 NOTE — Anesthesia Procedure Notes (Addendum)
Procedure Name: Intubation Date/Time: 08/02/2018 12:46 PM Performed by: Maxwell Caul, CRNA Pre-anesthesia Checklist: Patient identified, Emergency Drugs available, Suction available and Patient being monitored Patient Re-evaluated:Patient Re-evaluated prior to induction Oxygen Delivery Method: Circle system utilized Preoxygenation: Pre-oxygenation with 100% oxygen Induction Type: IV induction, Rapid sequence and Cricoid Pressure applied Laryngoscope Size: Mac and 3 Grade View: Grade II Tube type: Oral Tube size: 7.0 mm Number of attempts: 1 Airway Equipment and Method: Stylet Placement Confirmation: ETT inserted through vocal cords under direct vision,  positive ETCO2 and breath sounds checked- equal and bilateral Secured at: 22 cm Tube secured with: Tape Dental Injury: Teeth and Oropharynx as per pre-operative assessment

## 2018-08-02 NOTE — H&P (Signed)
[facility] History and Physical   Primary Care Physician:  [name], MD   Reason for Procedure:   Dysphagia, achalasia Plan:    EGD with pneumatic dilation     HPI: [name] is a 85 y.o. female with h/o achalsia, significant esophageal dilation with worsening dysphagia.    Past Medical History:  Diagnosis Date  . Achalasia   . Anemia   . Arthritis    back  . Chronic steroid use   . Complete heart block ([place])    S/P PACEMAKER 2000 W/ GENERATOR CHANGE 2009  . Empyema, right ([place]) PULMOLOGIST-- DR CLANCE   VATS 06/23/2012 cultures negative to date CXR 07/19/12 persistent airfluid levels/  CXR 11-01-2012 IMPROVE RIGHT PLEURAL EFFUSION  . GERD (gastroesophageal reflux disease)   . History of aspiration pneumonitis    DEC 2013  . History of hiatal hernia   . Hypertension   . Inguinal hernia    right  . Intrinsic urethral sphincter deficiency   . Megaesophagus   . Mixed stress and urge urinary incontinence   . Multinodular thyroid 06/26/2012   Multi nodular goiter. Large nodules in both lobes of the gland.  These nodules fit national criteria for fine needle aspiration  biopsy if not previously assessed.    . OSA on CPAP    cpap, doees not know settings  . Polymyalgia rheumatica (HCC)    on chronic Prednisone 5mg  daily  . RBBB   . S/P dilatation of esophageal stricture     Past Surgical History:  Procedure Laterality Date  . APPENDECTOMY  1953   w/ removal benign kidney tumor   . BALLOON DILATION  07/24/2012   Procedure: BALLOON DILATION;  Surgeon: Inda Castle, MD;  Location: [facility];  Service: Endoscopy;  Laterality: N/A;  . BALLOON DILATION N/A 12/03/2015   Procedure: BALLOON DILATION;  Surgeon: Mauri Pole, MD;  Location: [facility];  Service: Endoscopy;  Laterality: N/A;  pnuematic balloon  . BALLOON DILATION N/A 03/16/2017   Procedure: BALLOON DILATION;  Surgeon: Mauri Pole, MD;  Location: [facility];  Service:  Endoscopy;  Laterality: N/A;  PNUEMATIC BALLOONS  . BOTOX INJECTION  08/07/2012   Procedure: BOTOX INJECTION;  Surgeon: Inda Castle, MD;  Location: [facility];  Service: Endoscopy;  Laterality: N/A;  . BOTOX INJECTION N/A 05/24/2013   Procedure: MACROPLASTIQUE IMPLANT;  Surgeon: Irine Seal, MD;  Location: [facility];  Service: Urology;  Laterality: N/A;  . BOTOX INJECTION  02/25/2014   Procedure: BOTOX INJECTION;  Surgeon: Inda Castle, MD;  Location: [facility];  Service: Endoscopy;;  . CARDIAC PACEMAKER PLACEMENT  06/1999  DR RUTH GREENFIELD AT [facility]  ( LAST PACER CHECK 05-09-2013) for CHB/   END-OF-LIFE GENERATOR CHANGE  2009  . CATARACT EXTRACTION W/ INTRAOCULAR LENS  IMPLANT, BILATERAL  2005  . DILATION AND CURETTAGE OF UTERUS    . ESOPHAGOGASTRODUODENOSCOPY  07/24/2012   Procedure: ESOPHAGOGASTRODUODENOSCOPY (EGD);  Surgeon: Inda Castle, MD;  Location: [facility];  Service: Endoscopy;  Laterality: N/A;  . ESOPHAGOGASTRODUODENOSCOPY  08/07/2012   Procedure: ESOPHAGOGASTRODUODENOSCOPY (EGD);  Surgeon: Inda Castle, MD;  Location: [facility];  Service: Endoscopy;  Laterality: N/A;  . ESOPHAGOGASTRODUODENOSCOPY N/A 02/25/2014   Procedure: ESOPHAGOGASTRODUODENOSCOPY (EGD);  Surgeon: Inda Castle, MD;  Location: [facility];  Service: Endoscopy;  Laterality: N/A;  . ESOPHAGOGASTRODUODENOSCOPY N/A 03/16/2017   Procedure: ESOPHAGOGASTRODUODENOSCOPY (EGD);  Surgeon: Mauri Pole, MD;  Location: [facility];  Service: Endoscopy;  Laterality: N/A;  . ESOPHAGOGASTRODUODENOSCOPY (EGD) WITH ESOPHAGEAL DILATION  06/27/2012   Procedure: ESOPHAGOGASTRODUODENOSCOPY (EGD) WITH ESOPHAGEAL DILATION;  Surgeon: Ladene Artist, MD,FACG;  Location: [facility];  Service: Endoscopy;  Laterality: N/A;  . ESOPHAGOGASTRODUODENOSCOPY (EGD) WITH PROPOFOL N/A 10/09/2015   Procedure: ESOPHAGOGASTRODUODENOSCOPY (EGD) WITH PROPOFOL ( WITH BOTOX);  Surgeon: Milus Banister, MD;  Location: [facility];  Service: Endoscopy;  Laterality: N/A;  . ESOPHAGOGASTRODUODENOSCOPY (EGD) WITH PROPOFOL N/A 12/03/2015   Procedure: ESOPHAGOGASTRODUODENOSCOPY (EGD) WITH PROPOFOL;  Surgeon: Mauri Pole, MD;  Location: [facility];  Service: Endoscopy;  Laterality: N/A;  . PACEMAKER GENERATOR CHANGE  12/13/2007   at [facility]  . TOE SURGERY  2013   left 3rd toe HAMMERTOE REPAIR  . TONSILLECTOMY  AS CHILD  . TOTAL HIP ARTHROPLASTY Right 2001  . VERICOSE VEIN LIGATION    . VIDEO ASSISTED THORACOSCOPY (VATS)/DECORTICATION  06/23/2012   Procedure: VIDEO ASSISTED THORACOSCOPY (VATS)/DECORTICATION;  Surgeon: Grace Isaac, MD;  Location: [facility];  Service: Thoracic;  Laterality: Right;  Marland Kitchen VIDEO ASSISTED THORACOSCOPY (VATS)/EMPYEMA     06/23/2012  . VIDEO BRONCHOSCOPY  06/23/2012   Procedure: VIDEO BRONCHOSCOPY;  Surgeon: Grace Isaac, MD;  Location: [facility] OR;  Service: Thoracic;  Laterality: N/A;    Prior to Admission medications   Medication Sig Start Date End Date Taking? Authorizing Provider  acetaminophen (TYLENOL) 325 MG tablet Take 650 mg by mouth as needed for moderate pain (arthritis pain). Reported on 08/18/2015   Yes [provider]  AMBULATORY NON FORMULARY MEDICATION Medication Name: ultra zyme- Take 1 capsule twice daily   Yes [provider]  ascorbic acid (VITAMIN C) 500 MG tablet Take 500 mg by mouth daily. Reported on 08/26/2015   Yes [provider]  Calcium Carbonate (CALCIUM 600 PO) Take 600 mg by mouth daily.   Yes [provider]  Cholecalciferol (VITAMIN D-3) 1000 units CAPS Take 1,000 Units by mouth daily.    Yes [provider]  methocarbamol (ROBAXIN) 500 MG tablet Take 1 tablet (500 mg total) by mouth at bedtime. 07/19/18  Yes [name], MD  Omega 3 1200 MG CAPS Take 1,200 mg by mouth at bedtime.    Yes [provider]  PREMARIN vaginal cream Apply 1 Applicatorful topically 2 (two) times a  week.  03/07/17  Yes [provider]  furosemide (LASIX) 20 MG tablet Take 1 tablet (20 mg total) by mouth as needed (swelling). 07/19/18   [name], MD    Current Facility-Administered Medications  Medication Dose Route Frequency Provider Last Rate Last Dose  . lactated ringers infusion   Intravenous Continuous ,  V, MD 10 mL/hr at 08/02/18 1137 1,000 mL at 08/02/18 1137    Allergies as of 05/17/2018 - Review Complete 05/17/2018  Allergen Reaction Noted  . Actonel [risedronate sodium] Other (See Comments) 06/19/2012  . Ivp dye [iodinated diagnostic agents] Nausea And Vomiting 06/19/2012    Family History  Problem Relation Age of Onset  . Arthritis Father   . Heart disease Father   . Cancer Brother        ?spine  . Heart disease Brother   . Lung cancer Brother   . Diabetes Son   . Heart disease Mother        Congestive heart failure  . Heart disease Brother   . Arthritis Brother   . Hypertension Brother   . Colon cancer Neg Hx     Social History  Socioeconomic History  . Marital status: Married    Spouse name: Not on file  . Number of children: 66  . Years of education: Not on file  . Highest education level: Not on file  Occupational History  . Occupation: Retired Regulatory affairs officer Needs  . Financial resource strain: Not on file  . Food insecurity:    Worry: Not on file    Inability: Not on file  . Transportation needs:    Medical: Not on file    Non-medical: Not on file  Tobacco Use  . Smoking status: Passive Smoke Exposure - Never Smoker  . Smokeless tobacco: Never Used  . Tobacco comment: Exposure through father only.  Substance and Sexual Activity  . Alcohol use: Yes    Alcohol/week: 3.0 standard drinks    Types: 3 Glasses of wine per week    Comment: 1 drink x 3 days/week  . Drug use: No  . Sexual activity: Never    Comment: married  Lifestyle  . Physical activity:    Days per week: Not on file    Minutes  per session: Not on file  . Stress: Not on file  Relationships  . Social connections:    Talks on phone: Not on file    Gets together: Not on file    Attends religious service: Not on file    Active member of club or organization: Not on file    Attends meetings of clubs or organizations: Not on file    Relationship status: Not on file  . Intimate partner violence:    Fear of current or ex partner: Not on file    Emotionally abused: Not on file    Physically abused: Not on file    Forced sexual activity: Not on file  Other Topics Concern  . Not on file  Social History Narrative   Lives at [facility] since 05/18/2012   Married   Pacemaker   POA   Never smoked   Alcohol-wine 2-3 nights a week    Exercise - exercise classes 3 days a week    [employer] [facility] at [facility]       Pulmonary:   Originally from [place]. Has lived in [place], [place], [place], & moved to [place] in 1961. 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 No bird exposure.     Review of Systems:  All other review of systems negative except as mentioned in the HPI.  Physical Exam: Vital signs in last 24 hours: Temp:  [97.7 F (36.5 C)] 97.7 F (36.5 C) (01/22 1131) Pulse Rate:  [84] 84 (01/22 1131) Resp:  [16] 16 (01/22 1131) BP: (178)/(85) 178/85 (01/22 1131) SpO2:  [95 %] 95 % (01/22 1131) Weight:  [32 kg] 63 kg (01/22 1131)   General:   Alert,  Well-developed, well-nourished, pleasant and cooperative in NAD Lungs:  Clear throughout to auscultation.   Heart:  Regular rate and rhythm; no murmurs, clicks, rubs,  or gallops. Abdomen:  Soft, nontender and nondistended. Normal bowel sounds.   Neuro/Psych:  Alert and cooperative. Normal mood and affect. A and O x 3   K. Denzil Magnuson , MD [phone]

## 2018-08-02 NOTE — Transfer of Care (Signed)
Immediate Anesthesia Transfer of Care Note  Patient: [name]  Procedure(s) Performed: FOREIGN BODY REMOVAL ESOPHAGOGASTRODUODENOSCOPY (EGD) (N/A )  Patient Location: PACU and Endoscopy Unit  Anesthesia Type:General  Level of Consciousness: awake, alert  and oriented  Airway & Oxygen Therapy: Patient Spontanous Breathing and Patient connected to face mask oxygen  Post-op Assessment: Report given to RN and Post -op Vital signs reviewed and stable  Post vital signs: Reviewed and stable  Last Vitals:  Vitals Value Taken Time  BP 176/65 08/02/2018  1:36 PM  Temp 36.4 C 08/02/2018  1:36 PM  Pulse 66 08/02/2018  1:37 PM  Resp 20 08/02/2018  1:37 PM  SpO2 100 % 08/02/2018  1:37 PM  Vitals shown include unvalidated device data.  Last Pain:  Vitals:   08/02/18 1337  TempSrc:   PainSc: 0-No pain         Complications: No apparent anesthesia complications

## 2018-08-02 NOTE — Discharge Instructions (Signed)
YOU HAD AN ENDOSCOPIC PROCEDURE TODAY: Refer to the procedure report and other information in the discharge instructions given to you for any specific questions about what was found during the examination. If this information does not answer your questions, please call [facility] office at [phone] to clarify.   YOU SHOULD EXPECT: Some feelings of bloating in the abdomen. Passage of more gas than usual. Walking can help get rid of the air that was put into your GI tract during the procedure and reduce the bloating. . Some abdominal soreness may be present for a day or two, also.  DIET: Your first meal following the procedure should be a light meal and then it is ok to progress to your normal diet. A half-sandwich or bowl of soup is an example of a good first meal. Heavy or fried foods are harder to digest and may make you feel nauseous or bloated. Drink plenty of fluids but you should avoid alcoholic beverages for 24 hours. If you had a esophageal dilation, please see attached instructions for diet.    ACTIVITY: Your care partner should take you home directly after the procedure. You should plan to take it easy, moving slowly for the rest of the day. You can resume normal activity the day after the procedure however YOU SHOULD NOT DRIVE, use power tools, machinery or perform tasks that involve climbing or major physical exertion for 24 hours (because of the sedation medicines used during the test).   SYMPTOMS TO REPORT IMMEDIATELY: A gastroenterologist can be reached at any hour. Please call [phone]  for any of the following symptoms:   Following upper endoscopy (EGD, EUS, ERCP, esophageal dilation) Vomiting of blood or coffee ground material  New, significant abdominal pain  New, significant chest pain or pain under the shoulder blades  Painful or persistently difficult swallowing  New shortness of breath  Black, tarry-looking or red, bloody stools  FOLLOW UP:  If any biopsies were taken  you will be contacted by phone or by letter within the next 1-3 weeks. Call [phone]  if you have not heard about the biopsies in 3 weeks.  Please also call with any specific questions about appointments or follow up tests.   Dysphagia Eating Plan, Pureed This diet is helpful for people with moderate to severe swallowing problems. Pureed foods are smooth and are prepared without lumps so that they can be swallowed safely. Work with your health care provider and your diet and nutrition specialist (dietitian) to make sure that you are following the diet safely and getting all the nutrients you need. What are tips for following this plan? General instructions  You may eat foods that are soft and have a pudding-like texture.  Do not eat foods that you have to chew. If you have to chew the food, then you cannot eat it.  Avoid foods that are hard, dry, sticky, chunky, lumpy, or stringy. Also avoid foods with nuts, seeds, raisins, skins, or pulp.  You may be instructed to thicken liquids. Follow your health care provider's instructions about how to do this and to what consistency. Cooking   If a food is not originally a smooth texture, you may be able to eat the food after: ? Pureeing it. This can be done with a blender. ? Moistening it. This can be done by adding juice, cooking liquid, gravy, or sauce to a dry food and then pureeing it. For example, you may have bread if you soak it in milk and puree it.  If a food is too thin, you may add a commercial thickener, corn starch, rice cereal, or potato flakes to thicken it.  Strain and throw away any liquid that separates from a solid pureed food before eating.  Strain lumps, chunks, pulp, and seeds from pureed foods before eating.  Reheat foods slowly to prevent a tough crust from forming. Meal planning  Eat a variety of foods to get all the nutrients you need.  Add dry milk or protein powder to food to increase calories and protein  content.  Follow your meal plan as told by your dietitian. What foods are allowed? The items listed may not be a complete list. Talk with your dietitian about what dietary choices are best for you. Grains Soft breads, pancakes, [place] toast, muffins, and bread stuffing pureed to a smooth, moist texture, without nuts or seeds. Cooked cereals that have a pudding-like consistency, such as cream of wheat or farina. Pureed oatmeal. Pureed, well-cooked pasta and rice. Vegetables Pureed vegetables. Smooth tomato paste or sauce. Mashed or pureed potatoes without skin. Fruits Pureed fruits such as melons and apples without seeds or pulp. Mashed bananas. Mashed avocado. Fruit juices without pulp or seeds. Meats and other protein foods Pureed meat, poultry, and fish. Smooth pate or liverwurst. Smooth souffles. Pureed beans (such as lentils). Pureed eggs. Smooth nut and seed butters. Pureed tofu. Dairy Yogurt. Milk. Pureed cottage cheese. Nutritional dairy drinks or shakes. Cream cheese. Smooth pudding, ice cream, sherbet, and malts. Fats and oils Butter. Margarine. Vegetable oils. Smooth and strained gravy. Sour cream. Mayonnaise. Smooth sauces such as white sauce, cheese sauce, or hollandaise sauce. Sweets and desserts Moistened and pureed cookies and cakes. Whipped topping. Gelatin. Pudding pops. Seasoning and other foods Finely ground spices. Jelly. Honey. Pureed casseroles. Strained soups. Pureed sandwiches. Beverages Anything prepared at the consistency recommended by your dietitian. What foods are not allowed? The items listed may not be a complete list. Talk with your dietitian about what dietary choices are best for you. Grains Oatmeal. Dry cereals. Hard breads. Breads with seeds or nuts. Whole pasta, rice, or other grains. Whole pancakes, waffles, biscuits, muffins, or rolls. Vegetables Whole vegetables. Stringy vegetables (such as celery). Tomatoes or tomato sauce with seeds. Fried  vegetables. Fruits Whole fresh, frozen, canned, or dried fruits that have not been pureed. Stringy fruits, such as pineapple or coconut. Watermelon with seeds. Dried fruit or fruit leather. Meat and other protein foods Whole or ground meat, fish, or poultry. Dried or cooked lentils or legumes that have been cooked but not mashed or pureed. Non-pureed eggs. Nuts and seeds. Crunchy peanut butter. Whole tofu or other meat alternatives. Dairy Cheese cubes or slices. Non-pureed cottage cheese. Yogurt with fruit chunks. Fats and oils All fats and sauces that have lumps or chunks. Sweets and desserts Solid desserts. Sticky, chewy sweets (such as licorice and caramel). Candy with nuts or coconut. Seasoning and other foods Coarse or seeded herbs and spices. Chunky preserves. Jams with seeds. Whole sandwiches. Non-pureed casseroles. Chunky soups. Summary  Pureed foods can be helpful for people with moderate to severe swallowing problems.  On the dysphagia eating plan, you may eat foods that are soft and have a pudding-like texture. You should avoid foods that you have to chew. If you have to chew the food, then you cannot eat it.  You may be instructed to thicken liquids. Follow your health care provider's instructions about how to do this and to what consistency. This information is not intended to  replace advice given to you by your health care provider. Make sure you discuss any questions you have with your health care provider. Document Released: 06/28/2005 Document Revised: 08/31/2016 Document Reviewed: 08/31/2016 Elsevier Interactive Patient Education  2019 [employer].

## 2018-08-02 NOTE — Op Note (Signed)
[facility] Patient Name: [name] Procedure Date: 08/02/2018 MRN: [id number] Attending MD: Mauri Pole , MD Date of Birth: 06-19-1929 CSN: [id number] Age: 85 Admit Type: Outpatient Procedure:                Upper GI endoscopy Indications:              Dysphagia, For management of achalasia Providers:                Mauri Pole, MD, Cleda Daub, RN,                            William Dalton, Technician Referring MD:              Medicines:                General Anesthesia Complications:             Estimated Blood Loss:     Estimated blood loss was minimal. Procedure:                Pre-Anesthesia Assessment:                           - Prior to the procedure, a History and Physical                            was performed, and patient medications and                            allergies were reviewed. The patient's tolerance of                            previous anesthesia was also reviewed. The risks                            and benefits of the procedure and the sedation                            options and risks were discussed with the patient.                            All questions were answered, and informed consent                            was obtained. Prior Anticoagulants: The patient has                            taken no previous anticoagulant or antiplatelet                            agents. ASA Grade Assessment: III - A patient with                            severe systemic disease. After reviewing the risks  and benefits, the patient was deemed in                            satisfactory condition to undergo the procedure.                           After obtaining informed consent, the endoscope was                            passed under direct vision. Throughout the                            procedure, the patient's blood pressure, pulse, and                            oxygen saturations  were monitored continuously. The                            [id number]-H190 ([id number] Olympus endoscope was introduced                            through the mouth, and advanced to the second part                            of duodenum. The upper GI endoscopy was technically                            difficult and complex due to abnormal anatomy. The                            patient tolerated the procedure well. Scope In: Scope Out: Findings:      The examined esophagus was grossly tortuous and the lumen of the       esophagus was severely dilated.      Food was found prediminantly in the proximal esophagus diverticula.       Removal of food was accomplished with suction and roth net.      Few diverticulum with a large opening were found in the upper third of       the esophagus and in the middle third of the esophagus.      The Z-line was regular and was found 38 cm from the incisors. Lower       esophageal sphincter was open with no resistance. Pneumatic dilation not       performed      The stomach was normal.      The examined duodenum was normal. Impression:               - Tortuous esophagus.                           - Dilation in the entire esophagus.                           - Food in the proximal esophagus. Removal was  successful.                           - Z-line regular, 38 cm from the incisors.                           - Normal stomach.                           - Normal examined duodenum. Moderate Sedation:      Not Applicable - Patient had care per Anesthesia. Recommendation:           - Pureed diet indefinitely. Swallow large sips of                            water 40-60 mins after a meal.                           - Continue present medications.                           - No ibuprofen, naproxen, or other non-steroidal                            anti-inflammatory drugs.                           - Follow an antireflux regimen  indefinitely.                           - Return to my office PRN. Procedure Code(s):        --- Professional ---                           [phone], Esophagogastroduodenoscopy, flexible,                            transoral; with removal of foreign body(s) Diagnosis Code(s):        --- Professional ---                           Q39.9, Congenital malformation of esophagus,                            unspecified                           K22.8, Other specified diseases of esophagus                           T18.128A, Food in esophagus causing other injury,                            initial encounter                           R13.10, Dysphagia, unspecified  K22.0, Achalasia of cardia CPT copyright 2018 American Medical Association. All rights reserved. The codes documented in this report are preliminary and upon coder review may  be revised to meet current compliance requirements. Mauri Pole, MD 08/02/2018 1:38:09 PM This report has been signed electronically. Number of Addenda: 0

## 2018-08-02 NOTE — Anesthesia Postprocedure Evaluation (Signed)
Anesthesia Post Note  Patient: KEIANA TAVELLA  Procedure(s) Performed: FOREIGN BODY REMOVAL ESOPHAGOGASTRODUODENOSCOPY (EGD) (N/A )     Patient location during evaluation: PACU Anesthesia Type: General Level of consciousness: sedated Pain management: pain level controlled Vital Signs Assessment: post-procedure vital signs reviewed and stable Respiratory status: spontaneous breathing and respiratory function stable Cardiovascular status: stable Postop Assessment: no apparent nausea or vomiting Anesthetic complications: no    Last Vitals:  Vitals:   08/02/18 1405 08/02/18 1410  BP:  (!) 146/64  Pulse:  71  Resp:  19  Temp:    SpO2: 100% 95%    Last Pain:  Vitals:   08/02/18 1400  TempSrc:   PainSc: 0-No pain                 Danella Philson DANIEL

## 2018-08-02 NOTE — Anesthesia Preprocedure Evaluation (Addendum)
Anesthesia Evaluation  Patient identified by MRN, date of birth, ID band Patient awake    Reviewed: Allergy & Precautions, NPO status , Patient's Chart, lab work & pertinent test results  Airway Mallampati: I  TM Distance: >3 FB Neck ROM: Full    Dental no notable dental hx. (+) Dental Advisory Given   Pulmonary sleep apnea and Continuous Positive Airway Pressure Ventilation ,    Pulmonary exam normal        Cardiovascular hypertension, Pt. on medications Normal cardiovascular exam+ pacemaker      Neuro/Psych    GI/Hepatic GERD  Controlled,  Endo/Other    Renal/GU      Musculoskeletal  (+) Arthritis , Rheumatoid disorders,    Abdominal   Peds  Hematology   Anesthesia Other Findings   Reproductive/Obstetrics                            Anesthesia Physical  Anesthesia Plan  ASA: III  Anesthesia Plan: General   Post-op Pain Management:    Induction:   PONV Risk Score and Plan: 2 and 3 and Ondansetron, Treatment may vary due to age or medical condition, Propofol infusion and Dexamethasone  Airway Management Planned: Oral ETT  Additional Equipment:   Intra-op Plan:   Post-operative Plan: Extubation in OR  Informed Consent: I have reviewed the patients History and Physical, chart, labs and discussed the procedure including the risks, benefits and alternatives for the proposed anesthesia with the patient or authorized representative who has indicated his/her understanding and acceptance.     Dental advisory given  Plan Discussed with: CRNA, Surgeon and Anesthesiologist  Anesthesia Plan Comments:        Anesthesia Quick Evaluation

## 2018-08-03 ENCOUNTER — Other Ambulatory Visit: Payer: Medicare Other

## 2018-08-03 NOTE — Addendum Note (Signed)
Addendum  created 08/03/18 1013 by Lollie Sails, CRNA   Charge Capture section accepted

## 2018-08-10 DIAGNOSIS — E042 Nontoxic multinodular goiter: Secondary | ICD-10-CM | POA: Diagnosis not present

## 2018-08-10 DIAGNOSIS — G4733 Obstructive sleep apnea (adult) (pediatric): Secondary | ICD-10-CM

## 2018-08-10 DIAGNOSIS — R739 Hyperglycemia, unspecified: Secondary | ICD-10-CM

## 2018-08-11 LAB — COMPLETE METABOLIC PANEL WITH GFR
AG Ratio: 1.5 (calc) (ref 1.0–2.5)
ALBUMIN MSPROF: 4 g/dL (ref 3.6–5.1)
ALT: 11 U/L (ref 6–29)
AST: 15 U/L (ref 10–35)
Alkaline phosphatase (APISO): 64 U/L (ref 33–130)
BUN: 25 mg/dL (ref 7–25)
CO2: 29 mmol/L (ref 20–32)
Calcium: 10.3 mg/dL (ref 8.6–10.4)
Chloride: 108 mmol/L (ref 98–110)
Creat: 0.69 mg/dL (ref 0.60–0.88)
GFR, Est African American: 91 mL/min/{1.73_m2} (ref >=60)
GFR, Est Non African American: 78 mL/min/{1.73_m2} (ref >=60)
Globulin: 2.7 g/dL (calc) (ref 1.9–3.7)
Glucose, Bld: 111 mg/dL — ABNORMAL HIGH (ref 65–99)
Potassium: 4 mmol/L (ref 3.5–5.3)
Sodium: 146 mmol/L (ref 135–146)
TOTAL PROTEIN: 6.7 g/dL (ref 6.1–8.1)
Total Bilirubin: 0.4 mg/dL (ref 0.2–1.2)

## 2018-08-11 LAB — CBC
HCT: 35.8 % (ref 35.0–45.0)
HEMOGLOBIN: 12 g/dL (ref 11.7–15.5)
MCH: 32 pg (ref 27.0–33.0)
MCHC: 33.5 g/dL (ref 32.0–36.0)
MCV: 95.5 fL (ref 80.0–100.0)
MPV: 10.7 fL (ref 7.5–12.5)
Platelets: 123 10*3/uL — ABNORMAL LOW (ref 140–400)
RBC: 3.75 10*6/uL — ABNORMAL LOW (ref 3.80–5.10)
RDW: 12.8 % (ref 11.0–15.0)
WBC: 4.7 10*3/uL (ref 3.8–10.8)

## 2018-08-11 LAB — HEMOGLOBIN A1C
Hgb A1c MFr Bld: 5.6 % of total Hgb (ref <5.7)
Mean Plasma Glucose: 114 (calc)
eAG (mmol/L): 6.3 (calc)

## 2018-08-11 LAB — TSH: TSH: 0.88 mIU/L (ref 0.40–4.50)

## 2018-08-16 ENCOUNTER — Encounter: Payer: Self-pay | Admitting: Internal Medicine

## 2018-08-23 ENCOUNTER — Encounter: Payer: Medicare Other | Admitting: Internal Medicine

## 2018-10-23 ENCOUNTER — Other Ambulatory Visit: Payer: Self-pay

## 2018-10-23 ENCOUNTER — Encounter: Payer: Medicare Other | Admitting: *Deleted

## 2018-10-24 ENCOUNTER — Telehealth: Payer: Self-pay

## 2018-10-24 NOTE — Telephone Encounter (Signed)
Unable to speak  with patient to remind of missed remote transmission 

## 2018-11-05 ENCOUNTER — Encounter: Payer: Self-pay | Admitting: Internal Medicine

## 2018-11-10 ENCOUNTER — Ambulatory Visit (INDEPENDENT_AMBULATORY_CARE_PROVIDER_SITE_OTHER): Payer: Medicare Other | Admitting: *Deleted

## 2018-11-10 DIAGNOSIS — I495 Sick sinus syndrome: Secondary | ICD-10-CM | POA: Diagnosis not present

## 2018-11-12 LAB — CUP PACEART REMOTE DEVICE CHECK
Battery Impedance: 3388 Ohm
Battery Remaining Longevity: 18 mo
Battery Voltage: 2.72 V
Brady Statistic AP VP Percent: 5 %
Brady Statistic AP VS Percent: 19 %
Brady Statistic AS VP Percent: 0 %
Brady Statistic AS VS Percent: 76 %
Date Time Interrogation Session: 20200501203251
Implantable Lead Implant Date: 20001227
Implantable Lead Implant Date: 20001227
Implantable Lead Location: 753859
Implantable Lead Location: 753860
Implantable Lead Model: 5076
Implantable Lead Model: 5076
Implantable Pulse Generator Implant Date: 20090603
Lead Channel Impedance Value: 428 Ohm
Lead Channel Impedance Value: 602 Ohm
Lead Channel Pacing Threshold Amplitude: 0.5 V
Lead Channel Pacing Threshold Amplitude: 0.625 V
Lead Channel Pacing Threshold Pulse Width: 0.4 ms
Lead Channel Pacing Threshold Pulse Width: 0.4 ms
Lead Channel Setting Pacing Amplitude: 2 V
Lead Channel Setting Pacing Amplitude: 2.5 V
Lead Channel Setting Pacing Pulse Width: 0.46 ms
Lead Channel Setting Sensing Sensitivity: 4 mV

## 2018-11-15 ENCOUNTER — Encounter: Payer: Self-pay | Admitting: Cardiology

## 2018-11-15 NOTE — Progress Notes (Signed)
Remote pacemaker transmission.   

## 2019-01-17 ENCOUNTER — Encounter: Payer: Medicare Other | Admitting: Internal Medicine

## 2019-01-24 ENCOUNTER — Encounter: Payer: Self-pay | Admitting: Internal Medicine

## 2019-01-24 ENCOUNTER — Non-Acute Institutional Stay: Payer: Medicare Other | Admitting: Internal Medicine

## 2019-01-24 ENCOUNTER — Other Ambulatory Visit: Payer: Self-pay

## 2019-01-24 VITALS — BP 128/76 | HR 95 | Temp 97.5°F | Ht 60.0 in | Wt 135.6 lb

## 2019-01-24 DIAGNOSIS — I495 Sick sinus syndrome: Secondary | ICD-10-CM | POA: Diagnosis not present

## 2019-01-24 DIAGNOSIS — I472 Ventricular tachycardia: Secondary | ICD-10-CM

## 2019-01-24 DIAGNOSIS — R739 Hyperglycemia, unspecified: Secondary | ICD-10-CM

## 2019-01-24 DIAGNOSIS — R1310 Dysphagia, unspecified: Secondary | ICD-10-CM | POA: Diagnosis not present

## 2019-01-24 DIAGNOSIS — E785 Hyperlipidemia, unspecified: Secondary | ICD-10-CM

## 2019-01-24 DIAGNOSIS — M810 Age-related osteoporosis without current pathological fracture: Secondary | ICD-10-CM | POA: Diagnosis not present

## 2019-01-24 DIAGNOSIS — R1319 Other dysphagia: Secondary | ICD-10-CM

## 2019-01-24 DIAGNOSIS — I4729 Other ventricular tachycardia: Secondary | ICD-10-CM

## 2019-01-24 DIAGNOSIS — E042 Nontoxic multinodular goiter: Secondary | ICD-10-CM

## 2019-01-24 NOTE — Progress Notes (Signed)
Location:  [place] of Service:  Clinic (12)  Provider:   Code Status:  Goals of Care:  Advanced Directives 01/24/2019  Does Patient Have a Medical Advance Directive? Yes  Type of Advance Directive Out of facility DNR (pink MOST or yellow form);Healthcare Power of Attorney  Does patient want to make changes to medical advance directive? No - Patient declined  Copy of [place] in Chart? -  Would patient like information on creating a medical advance directive? -  Pre-existing out of facility DNR order (yellow form or pink MOST form) Yellow form placed in chart (order not valid for inpatient use);Pink MOST form placed in chart (order not valid for inpatient use)     Chief Complaint  Patient presents with  . Medical Management of Chronic Issues    follow up, discuss hernia     HPI: Patient is a 85 y.o. female seen today for medical management of chronic diseases.   Patient has a history of dysphagia s/p dilatation,  Sick sinus syndrome status post pacemaker.  Follows with cardiology Age-related osteoporosis.  Only on vitamin D and calcium Urinary incontinence continues on Premarin Inguinal hernia had seen surgeon but not sure if she wants to go through surgery Hyperlipidemia not on any statin LDL less than 100  Patient came for follow-up today.  Did not have any acute complaint.  Lives with her husband.  Does not use any assistive device.  Has not had any falls recently.  Past Medical History:  Diagnosis Date  . Achalasia   . Anemia   . Arthritis    back  . Chronic steroid use   . Complete heart block ([place])    S/P PACEMAKER 2000 W/ GENERATOR CHANGE 2009  . Empyema, right ([place]) PULMOLOGIST-- DR CLANCE   VATS 06/23/2012 cultures negative to date CXR 07/19/12 persistent airfluid levels/  CXR 11-01-2012 IMPROVE RIGHT PLEURAL EFFUSION  . GERD (gastroesophageal reflux disease)   . History of aspiration pneumonitis    DEC 2013  . History of  hiatal hernia   . Hypertension   . Inguinal hernia    right  . Intrinsic urethral sphincter deficiency   . Megaesophagus   . Mixed stress and urge urinary incontinence   . Multinodular thyroid 06/26/2012   Multi nodular goiter. Large nodules in both lobes of the gland.  These nodules fit national criteria for fine needle aspiration  biopsy if not previously assessed.    . OSA on CPAP    cpap, doees not know settings  . Polymyalgia rheumatica (HCC)    on chronic Prednisone 77m daily  . RBBB   . S/P dilatation of esophageal stricture     Past Surgical History:  Procedure Laterality Date  . APPENDECTOMY  1953   w/ removal benign kidney tumor   . BALLOON DILATION  07/24/2012   Procedure: BALLOON DILATION;  Surgeon: RInda Castle MD;  [facility];  Service: Endoscopy;  Laterality: N/A;  . BALLOON DILATION N/A 12/03/2015   Procedure: BALLOON DILATION;  Surgeon: KMauri Pole MD;  [facility];  Service: Endoscopy;  Laterality: N/A;  pnuematic balloon  . BALLOON DILATION N/A 03/16/2017   Procedure: BALLOON DILATION;  Surgeon: NMauri Pole MD;  [facility];  Service: Endoscopy;  Laterality: N/A;  PNUEMATIC BALLOONS  . BOTOX INJECTION  08/07/2012   Procedure: BOTOX INJECTION;  Surgeon: RInda Castle MD;  [facility];  Service: Endoscopy;  Laterality:  N/A;  . BOTOX INJECTION N/A 05/24/2013   Procedure: MACROPLASTIQUE IMPLANT;  Surgeon: Irine Seal, MD;  Location[facility];  Service: Urology;  Laterality: N/A;  . BOTOX INJECTION  02/25/2014   Procedure: BOTOX INJECTION;  Surgeon: Inda Castle, MD;  [facility]: [facility];  Service: Endoscopy;;  . CARDIAC PACEMAKER PLACEMENT  06/1999  DR RUTH GREENFIELD AT [facility]  ( LAST PACER CHECK 05-09-2013) for CHB/   END-OF-LIFE GENERATOR CHANGE  2009  . CATARACT EXTRACTION W/ INTRAOCULAR LENS  IMPLANT, BILATERAL  2005  . DILATION AND CURETTAGE OF UTERUS    .  ESOPHAGOGASTRODUODENOSCOPY  07/24/2012   Procedure: ESOPHAGOGASTRODUODENOSCOPY (EGD);  Surgeon: Inda Castle, MD;  [facility];  Service: Endoscopy;  Laterality: N/A;  . ESOPHAGOGASTRODUODENOSCOPY  08/07/2012   Procedure: ESOPHAGOGASTRODUODENOSCOPY (EGD);  Surgeon: Inda Castle, MD;  [facility];  Service: Endoscopy;  Laterality: N/A;  . ESOPHAGOGASTRODUODENOSCOPY N/A 02/25/2014   Procedure: ESOPHAGOGASTRODUODENOSCOPY (EGD);  Surgeon: Inda Castle, MD;  [facility];  Service: Endoscopy;  Laterality: N/A;  . ESOPHAGOGASTRODUODENOSCOPY N/A 03/16/2017   Procedure: ESOPHAGOGASTRODUODENOSCOPY (EGD);  Surgeon: Mauri Pole, MD;  [facility];  Service: Endoscopy;  Laterality: N/A;  . ESOPHAGOGASTRODUODENOSCOPY N/A 08/02/2018   Procedure: ESOPHAGOGASTRODUODENOSCOPY (EGD);  Surgeon: Mauri Pole, MD;  [facility];  Service: Endoscopy;  Laterality: N/A;  . ESOPHAGOGASTRODUODENOSCOPY (EGD) WITH ESOPHAGEAL DILATION  06/27/2012   Procedure: ESOPHAGOGASTRODUODENOSCOPY (EGD) WITH ESOPHAGEAL DILATION;  Surgeon: Ladene Artist, MD,FACG;  Location: [facility];  Service: Endoscopy;  Laterality: N/A;  . ESOPHAGOGASTRODUODENOSCOPY (EGD) WITH PROPOFOL N/A 10/09/2015   Procedure: ESOPHAGOGASTRODUODENOSCOPY (EGD) WITH PROPOFOL ( WITH BOTOX);  Surgeon: Milus Banister, MD;  [facility];  Service: Endoscopy;  Laterality: N/A;  . ESOPHAGOGASTRODUODENOSCOPY (EGD) WITH PROPOFOL N/A 12/03/2015   Procedure: ESOPHAGOGASTRODUODENOSCOPY (EGD) WITH PROPOFOL;  Surgeon: Mauri Pole, MD;  Location: [facility];  Service: Endoscopy;  Laterality: N/A;  . FOREIGN BODY REMOVAL  08/02/2018   Procedure: FOREIGN BODY REMOVAL;  Surgeon: Mauri Pole, MD;  [facility];  Service: Endoscopy;;  . PACEMAKER GENERATOR CHANGE  12/13/2007   at [facility]  . TOE SURGERY  2013   left 3rd toe HAMMERTOE REPAIR  . TONSILLECTOMY  AS CHILD  . TOTAL HIP  ARTHROPLASTY Right 2001  . VERICOSE VEIN LIGATION    . VIDEO ASSISTED THORACOSCOPY (VATS)/DECORTICATION  06/23/2012   Procedure: VIDEO ASSISTED THORACOSCOPY (VATS)/DECORTICATION;  Surgeon: Grace Isaac, MD;  Location: [facility];  Service: Thoracic;  Laterality: Right;  Marland Kitchen VIDEO ASSISTED THORACOSCOPY (VATS)/EMPYEMA     06/23/2012  . VIDEO BRONCHOSCOPY  06/23/2012   Procedure: VIDEO BRONCHOSCOPY;  Surgeon: Grace Isaac, MD;  [facility];  Service: Thoracic;  Laterality: N/A;    Allergies  Allergen Reactions  . Actonel [Risedronate Sodium] Other (See Comments)    Joint aches; rechallenged --caused joint aches  . Ivp Dye [Iodinated Diagnostic Agents] Nausea And Vomiting    Outpatient Encounter Medications as of 01/24/2019  Medication Sig  . acetaminophen (TYLENOL) 325 MG tablet Take 650 mg by mouth as needed for moderate pain (arthritis pain). Reported on 08/18/2015  . AMBULATORY NON FORMULARY MEDICATION Medication Name: ultra zyme- Take 1 capsule twice daily  . ascorbic acid (VITAMIN C) 500 MG tablet Take 500 mg by mouth daily. Reported on 08/26/2015  . Calcium Carbonate (CALCIUM 600 PO) Take 600 mg by mouth daily.  . Cholecalciferol (VITAMIN D-3) 1000 units CAPS Take 1,000 Units  by mouth daily.   . furosemide (LASIX) 20 MG tablet Take 1 tablet (20 mg total) by mouth as needed (swelling).  . Omega 3 1200 MG CAPS Take 1,200 mg by mouth at bedtime.   Marland Kitchen PREMARIN vaginal cream Apply 1 Applicatorful topically 2 (two) times a week.   . [DISCONTINUED] methocarbamol (ROBAXIN) 500 MG tablet Take 1 tablet (500 mg total) by mouth at bedtime.   No facility-administered encounter medications on file as of 01/24/2019.     Review of Systems:  Review of Systems  Review of Systems  Constitutional: Negative for activity change, appetite change, chills, diaphoresis, fatigue and fever.  HENT: Negative for mouth sores, postnasal drip, rhinorrhea, sinus pain and sore throat.   Respiratory: Negative  for apnea, cough, chest tightness, shortness of breath and wheezing.   Cardiovascular: Negative for chest pain, palpitations and leg swelling.  Gastrointestinal: Negative for abdominal distention, abdominal pain, constipation, diarrhea, nausea and vomiting.  Genitourinary: Negative for dysuria and frequency.  Musculoskeletal: Negative for arthralgias, joint swelling and myalgias.  Skin: Negative for rash.  Neurological: Negative for dizziness, syncope, weakness, light-headedness and numbness.  Psychiatric/Behavioral: Negative for behavioral problems, confusion and sleep disturbance.      Health Maintenance  Topic Date Due  . INFLUENZA VACCINE  02/10/2019  . TETANUS/TDAP  11/04/2025  . DEXA SCAN  Completed  . PNA vac Low Risk Adult  Completed    Physical Exam: Vitals:   01/24/19 1614  BP: 128/76  Pulse: 95  Temp: (!) 97.5 F (36.4 C)  TempSrc: Oral  SpO2: 96%  Weight: 135 lb 9.6 oz (61.5 kg)  Height: 5' (1.524 m)   Body mass index is 26.48 kg/m. Physical Exam Constitutional: Oriented to person, place, and time. Well-developed and well-nourished.  HENT:  Head: Normocephalic.  Mouth/Throat: Oropharynx is clear and moist.  Eyes: Pupils are equal, round, and reactive to light.  Neck: Neck supple.  Cardiovascular: Normal rate and normal heart sounds.  No murmur heard. Pulmonary/Chest: Effort normal and breath sounds normal. No respiratory distress. No wheezes. She has no rales.  Abdominal: Soft. Bowel sounds are normal. No distension. There is no tenderness. There is no rebound.  Musculoskeletal Mild edema Bilateral  Lymphadenopathy: none Neurological: Alert and oriented to person, place, and time. No Focal Deficits But does have unsteady Gait.  Skin: Skin is warm and dry.  Psychiatric: Normal mood and affect. Behavior is normal. Thought content normal.   Labs reviewed: Basic Metabolic Panel: Recent Labs    02/06/18 0000 08/10/18 0800  NA  --  146  K  --  4.0  CL   --  108  CO2  --  29  GLUCOSE  --  111*  BUN  --  25  CREATININE  --  0.69  CALCIUM  --  10.3  TSH 0.98 0.88   Liver Function Tests: Recent Labs    08/10/18 0800  AST 15  ALT 11  BILITOT 0.4  PROT 6.7   No results for input(s): LIPASE, AMYLASE in the last 8760 hours. No results for input(s): AMMONIA in the last 8760 hours. CBC: Recent Labs    08/10/18 0800  WBC 4.7  HGB 12.0  HCT 35.8  MCV 95.5  PLT 123*   Lipid Panel: No results for input(s): CHOL, HDL, LDLCALC, TRIG, CHOLHDL, LDLDIRECT in the last 8760 hours. Lab Results  Component Value Date   HGBA1C 5.6 08/10/2018    Procedures since last visit: No results found.  Assessment/Plan Sick sinus  syndrome ([place]) - Plan: S/P PAcemaker  H/O Nonsustained ventricular tachycardia ([place]) - Plan: CBC with Differential/Platelet, CMP with eGFR([facility]),  Stable Follows with Cardiology  Esophageal dysphagia - Plan:  S/P Dilatation D/W patient to [employer]  Restart her H2 blocker  Age-related osteoporosis without current pathological fracture - Plan: T Score of -2.9 She will candidate for Prolia To be d/w next appointment  Hyperlipidemia- Plan: Lipid Panel,  Last LDL was 93  Hyperglycemia - Plan: Hemoglobin A1c,   Multinodular thyroid - Plan: TSH,  Inguinal Hernia D/W patient her option of Following back with her surgeon or not doing anything She is going to d/w her daughter Nelma Rothman Gait D/W patient of using [facility]   Labs/tests ordered:   Next appt:  02/26/2019

## 2019-02-09 ENCOUNTER — Ambulatory Visit (INDEPENDENT_AMBULATORY_CARE_PROVIDER_SITE_OTHER): Payer: Medicare Other | Admitting: *Deleted

## 2019-02-09 ENCOUNTER — Telehealth: Payer: Self-pay

## 2019-02-09 DIAGNOSIS — I495 Sick sinus syndrome: Secondary | ICD-10-CM | POA: Diagnosis not present

## 2019-02-09 LAB — CUP PACEART REMOTE DEVICE CHECK
Battery Impedance: 3590 Ohm
Battery Remaining Longevity: 17 mo
Battery Voltage: 2.72 V
Brady Statistic AP VP Percent: 4 %
Brady Statistic AP VS Percent: 19 %
Brady Statistic AS VP Percent: 0 %
Brady Statistic AS VS Percent: 77 %
Date Time Interrogation Session: 20200731142713
Implantable Lead Implant Date: 20001227
Implantable Lead Implant Date: 20001227
Implantable Lead Location: 753859
Implantable Lead Location: 753860
Implantable Lead Model: 5076
Implantable Lead Model: 5076
Implantable Pulse Generator Implant Date: 20090603
Lead Channel Impedance Value: 442 Ohm
Lead Channel Impedance Value: 542 Ohm
Lead Channel Pacing Threshold Amplitude: 0.5 V
Lead Channel Pacing Threshold Amplitude: 0.625 V
Lead Channel Pacing Threshold Pulse Width: 0.4 ms
Lead Channel Pacing Threshold Pulse Width: 0.4 ms
Lead Channel Setting Pacing Amplitude: 2 V
Lead Channel Setting Pacing Amplitude: 2.5 V
Lead Channel Setting Pacing Pulse Width: 0.4 ms
Lead Channel Setting Sensing Sensitivity: 4 mV

## 2019-02-09 NOTE — Telephone Encounter (Signed)
Left message for patient to remind of missed remote transmission.  

## 2019-02-12 ENCOUNTER — Other Ambulatory Visit: Payer: Self-pay | Admitting: *Deleted

## 2019-02-12 MED ORDER — FUROSEMIDE 20 MG PO TABS: 20.0000 mg | ORAL_TABLET | ORAL | 2 refills | Status: DC | PRN

## 2019-02-12 NOTE — Telephone Encounter (Signed)
Patient requested refill

## 2019-02-15 ENCOUNTER — Encounter: Payer: Self-pay | Admitting: Cardiology

## 2019-02-15 NOTE — Progress Notes (Signed)
Remote pacemaker transmission.   

## 2019-02-26 ENCOUNTER — Other Ambulatory Visit: Payer: Self-pay

## 2019-02-26 ENCOUNTER — Other Ambulatory Visit: Payer: Medicare Other

## 2019-02-26 DIAGNOSIS — R739 Hyperglycemia, unspecified: Secondary | ICD-10-CM

## 2019-02-26 DIAGNOSIS — I472 Ventricular tachycardia: Secondary | ICD-10-CM | POA: Diagnosis not present

## 2019-02-26 DIAGNOSIS — I4729 Other ventricular tachycardia: Secondary | ICD-10-CM

## 2019-02-26 DIAGNOSIS — E785 Hyperlipidemia, unspecified: Secondary | ICD-10-CM

## 2019-02-26 DIAGNOSIS — E042 Nontoxic multinodular goiter: Secondary | ICD-10-CM | POA: Diagnosis not present

## 2019-02-27 LAB — COMPLETE METABOLIC PANEL WITH GFR
AG Ratio: 1.5 (calc) (ref 1.0–2.5)
ALT: 7 U/L (ref 6–29)
AST: 13 U/L (ref 10–35)
Albumin: 3.6 g/dL (ref 3.6–5.1)
Alkaline phosphatase (APISO): 87 U/L (ref 37–153)
BUN/Creatinine Ratio: 48 (calc) — ABNORMAL HIGH (ref 6–22)
BUN: 28 mg/dL — ABNORMAL HIGH (ref 7–25)
CO2: 29 mmol/L (ref 20–32)
Calcium: 9.8 mg/dL (ref 8.6–10.4)
Chloride: 108 mmol/L (ref 98–110)
Creat: 0.58 mg/dL — ABNORMAL LOW (ref 0.60–0.88)
GFR, Est African American: 95 mL/min/{1.73_m2} (ref >=60)
GFR, Est Non African American: 82 mL/min/{1.73_m2} (ref >=60)
Globulin: 2.4 g/dL (calc) (ref 1.9–3.7)
Glucose, Bld: 117 mg/dL — ABNORMAL HIGH (ref 65–99)
Potassium: 3.9 mmol/L (ref 3.5–5.3)
Sodium: 144 mmol/L (ref 135–146)
Total Bilirubin: 0.4 mg/dL (ref 0.2–1.2)
Total Protein: 6 g/dL — ABNORMAL LOW (ref 6.1–8.1)

## 2019-02-27 LAB — CBC WITH DIFFERENTIAL/PLATELET
Absolute Monocytes: 419 cells/uL (ref 200–950)
Basophils Absolute: 32 cells/uL (ref 0–200)
Basophils Relative: 0.6 %
Eosinophils Absolute: 101 cells/uL (ref 15–500)
Eosinophils Relative: 1.9 %
HCT: 34.5 % — ABNORMAL LOW (ref 35.0–45.0)
Hemoglobin: 11.6 g/dL — ABNORMAL LOW (ref 11.7–15.5)
Lymphs Abs: 1129 cells/uL (ref 850–3900)
MCH: 33.3 pg — ABNORMAL HIGH (ref 27.0–33.0)
MCHC: 33.6 g/dL (ref 32.0–36.0)
MCV: 99.1 fL (ref 80.0–100.0)
MPV: 10.6 fL (ref 7.5–12.5)
Monocytes Relative: 7.9 %
Neutro Abs: 3620 cells/uL (ref 1500–7800)
Neutrophils Relative %: 68.3 %
Platelets: 137 10*3/uL — ABNORMAL LOW (ref 140–400)
RBC: 3.48 10*6/uL — ABNORMAL LOW (ref 3.80–5.10)
RDW: 12.7 % (ref 11.0–15.0)
Total Lymphocyte: 21.3 %
WBC: 5.3 10*3/uL (ref 3.8–10.8)

## 2019-02-27 LAB — LIPID PANEL
Cholesterol: 129 mg/dL (ref <200)
HDL: 39 mg/dL — ABNORMAL LOW (ref >=50)
LDL Cholesterol (Calc): 76 mg/dL (calc)
Non-HDL Cholesterol (Calc): 90 mg/dL (calc) (ref <130)
Total CHOL/HDL Ratio: 3.3 (calc) (ref <5.0)
Triglycerides: 65 mg/dL (ref <150)

## 2019-02-27 LAB — HEMOGLOBIN A1C
Hgb A1c MFr Bld: 5.4 % of total Hgb (ref <5.7)
Mean Plasma Glucose: 108 (calc)
eAG (mmol/L): 6 (calc)

## 2019-02-27 LAB — TSH: TSH: 0.62 mIU/L (ref 0.40–4.50)

## 2019-03-20 ENCOUNTER — Ambulatory Visit (INDEPENDENT_AMBULATORY_CARE_PROVIDER_SITE_OTHER): Payer: Medicare Other | Admitting: Family

## 2019-03-20 ENCOUNTER — Encounter: Payer: Self-pay | Admitting: Family

## 2019-03-20 ENCOUNTER — Other Ambulatory Visit: Payer: Self-pay

## 2019-03-20 VITALS — BP 102/68 | HR 101 | Temp 96.9°F | Ht 60.0 in | Wt 135.4 lb

## 2019-03-20 DIAGNOSIS — M353 Polymyalgia rheumatica: Secondary | ICD-10-CM

## 2019-03-20 DIAGNOSIS — R600 Localized edema: Secondary | ICD-10-CM

## 2019-03-20 DIAGNOSIS — R2681 Unsteadiness on feet: Secondary | ICD-10-CM

## 2019-03-20 MED ORDER — PREDNISONE 10 MG PO TABS: 10.0000 mg | ORAL_TABLET | Freq: Every day | ORAL | 0 refills | Status: DC

## 2019-03-20 NOTE — Progress Notes (Signed)
Provider: Clorine Swing FNP-C  [name], MD  Patient Care Team: [name], MD as PCP - General (Internal Medicine) [place], [facility] [name], MD as Consulting Physician (Cardiology) Irine Seal, MD as Attending Physician (Urology) Clance, Armando Reichert, MD as Consulting Physician (Pulmonary Disease) Grace Isaac, MD as Consulting Physician (Cardiothoracic Surgery) Mauri Pole, MD as Consulting Physician (Gastroenterology)  Extended Emergency Contact Information Primary Emergency Contact: Hilburn,Francis Address: [address]          [place], [place] [address] [place] Phone: [phone] Relation: Spouse Secondary Emergency Contact: Pongratz,Dorothy Address: [address]          [place], [place] [address] [place] Phone: [phone] Mobile Phone: [phone] Relation: Daughter  Code Status:  Goals of care: Advanced Directive information Advanced Directives 01/24/2019  Does Patient Have a Medical Advance Directive? Yes  Type of Advance Directive Out of facility DNR (pink MOST or yellow form);Healthcare Power of Attorney  Does patient want to make changes to medical advance directive? No - Patient declined  Copy of [place] in Chart? -  Would patient like information on creating a medical advance directive? -  Pre-existing out of facility DNR order (yellow form or pink MOST form) Yellow form placed in chart (order not valid for inpatient use);Pink MOST form placed in chart (order not valid for inpatient use)     Chief Complaint  Patient presents with  . Acute Visit    Muscle Pain, patient states she hurts all over and this has been going on for over a week now and feels like polymyalgia rheumatica     HPI:  Pt is a 85 y.o. female seen today for an acute visit for evaluation of generalized muscle ,neck, hands,knees and legs Pain.She states hurts all over for over a week.she states  gets stiffness which is worst in the morning.she denies any fever,chills or swollen joints. She states feels like polymyalgia rheumatica.she has had similar symptoms in the past.she used to be on Prednisone which helped with the symptoms then she got off the prednisone since she was doing well.she denies any contact with persons with COVID-19 wears mask and practices social distancing. HR was 101 b/min on arrival but recheck 89 b/min at rest.      Past Medical History:  Diagnosis Date  . Achalasia   . Anemia   . Arthritis    back  . Chronic steroid use   . Complete heart block ([place])    S/P PACEMAKER 2000 W/ GENERATOR CHANGE 2009  . Empyema, right ([place]) PULMOLOGIST-- DR CLANCE   VATS 06/23/2012 cultures negative to date CXR 07/19/12 persistent airfluid levels/  CXR 11-01-2012 IMPROVE RIGHT PLEURAL EFFUSION  . GERD (gastroesophageal reflux disease)   . History of aspiration pneumonitis    DEC 2013  . History of hiatal hernia   . Hypertension   . Inguinal hernia    right  . Intrinsic urethral sphincter deficiency   . Megaesophagus   . Mixed stress and urge urinary incontinence   . Multinodular thyroid 06/26/2012   Multi nodular goiter. Large nodules in both lobes of the gland.  These nodules fit national criteria for fine needle aspiration  biopsy if not previously assessed.    . OSA on CPAP    cpap, doees not know settings  . Polymyalgia rheumatica (HCC)    on chronic Prednisone 5mg  daily  . RBBB   .  S/P dilatation of esophageal stricture    Past Surgical History:  Procedure Laterality Date  . APPENDECTOMY  1953   w/ removal benign kidney tumor   . BALLOON DILATION  07/24/2012   Procedure: BALLOON DILATION;  Surgeon: Inda Castle, MD;  Location: [facility];  Service: Endoscopy;  Laterality: N/A;  . BALLOON DILATION N/A 12/03/2015   Procedure: BALLOON DILATION;  Surgeon: Mauri Pole, MD;  Location: [facility];  Service: Endoscopy;  Laterality: N/A;  pnuematic balloon   . BALLOON DILATION N/A 03/16/2017   Procedure: BALLOON DILATION;  Surgeon: Mauri Pole, MD;  Location: [facility];  Service: Endoscopy;  Laterality: N/A;  PNUEMATIC BALLOONS  . BOTOX INJECTION  08/07/2012   Procedure: BOTOX INJECTION;  Surgeon: Inda Castle, MD;  Location: [facility];  Service: Endoscopy;  Laterality: N/A;  . BOTOX INJECTION N/A 05/24/2013   Procedure: MACROPLASTIQUE IMPLANT;  Surgeon: Irine Seal, MD;  Location: [facility];  Service: Urology;  Laterality: N/A;  . BOTOX INJECTION  02/25/2014   Procedure: BOTOX INJECTION;  Surgeon: Inda Castle, MD;  Location: [facility];  Service: Endoscopy;;  . CARDIAC PACEMAKER PLACEMENT  06/1999  DR RUTH GREENFIELD AT [facility]  ( LAST PACER CHECK 05-09-2013) for CHB/   END-OF-LIFE GENERATOR CHANGE  2009  . CATARACT EXTRACTION W/ INTRAOCULAR LENS  IMPLANT, BILATERAL  2005  . DILATION AND CURETTAGE OF UTERUS    . ESOPHAGOGASTRODUODENOSCOPY  07/24/2012   Procedure: ESOPHAGOGASTRODUODENOSCOPY (EGD);  Surgeon: Inda Castle, MD;  Location: [facility];  Service: Endoscopy;  Laterality: N/A;  . ESOPHAGOGASTRODUODENOSCOPY  08/07/2012   Procedure: ESOPHAGOGASTRODUODENOSCOPY (EGD);  Surgeon: Inda Castle, MD;  Location: [facility];  Service: Endoscopy;  Laterality: N/A;  . ESOPHAGOGASTRODUODENOSCOPY N/A 02/25/2014   Procedure: ESOPHAGOGASTRODUODENOSCOPY (EGD);  Surgeon: Inda Castle, MD;  Location: [facility];  Service: Endoscopy;  Laterality: N/A;  . ESOPHAGOGASTRODUODENOSCOPY N/A 03/16/2017   Procedure: ESOPHAGOGASTRODUODENOSCOPY (EGD);  Surgeon: Mauri Pole, MD;  Location: [facility];  Service: Endoscopy;  Laterality: N/A;  . ESOPHAGOGASTRODUODENOSCOPY N/A 08/02/2018   Procedure: ESOPHAGOGASTRODUODENOSCOPY (EGD);  Surgeon: Mauri Pole, MD;  Location: [facility];  Service: Endoscopy;  Laterality: N/A;  . ESOPHAGOGASTRODUODENOSCOPY (EGD) WITH ESOPHAGEAL DILATION  06/27/2012    Procedure: ESOPHAGOGASTRODUODENOSCOPY (EGD) WITH ESOPHAGEAL DILATION;  Surgeon: Ladene Artist, MD,FACG;  Location: [facility];  Service: Endoscopy;  Laterality: N/A;  . ESOPHAGOGASTRODUODENOSCOPY (EGD) WITH PROPOFOL N/A 10/09/2015   Procedure: ESOPHAGOGASTRODUODENOSCOPY (EGD) WITH PROPOFOL ( WITH BOTOX);  Surgeon: Milus Banister, MD;  Location: [facility];  Service: Endoscopy;  Laterality: N/A;  . ESOPHAGOGASTRODUODENOSCOPY (EGD) WITH PROPOFOL N/A 12/03/2015   Procedure: ESOPHAGOGASTRODUODENOSCOPY (EGD) WITH PROPOFOL;  Surgeon: Mauri Pole, MD;  Location: [facility];  Service: Endoscopy;  Laterality: N/A;  . FOREIGN BODY REMOVAL  08/02/2018   Procedure: FOREIGN BODY REMOVAL;  Surgeon: Mauri Pole, MD;  Location: [facility];  Service: Endoscopy;;  . PACEMAKER GENERATOR CHANGE  12/13/2007   at [facility]  . TOE SURGERY  2013   left 3rd toe HAMMERTOE REPAIR  . TONSILLECTOMY  AS CHILD  . TOTAL HIP ARTHROPLASTY Right 2001  . VERICOSE VEIN LIGATION    . VIDEO ASSISTED THORACOSCOPY (VATS)/DECORTICATION  06/23/2012   Procedure: VIDEO ASSISTED THORACOSCOPY (VATS)/DECORTICATION;  Surgeon: Grace Isaac, MD;  Location: [facility];  Service: Thoracic;  Laterality: Right;  Marland Kitchen VIDEO ASSISTED THORACOSCOPY (VATS)/EMPYEMA     06/23/2012  . VIDEO BRONCHOSCOPY  06/23/2012   Procedure: VIDEO BRONCHOSCOPY;  Surgeon: Grace Isaac, MD;  Location: [facility];  Service: Thoracic;  Laterality: N/A;    Allergies  Allergen Reactions  . Actonel [Risedronate Sodium] Other (See Comments)    Joint aches; rechallenged --caused joint aches  . Ivp Dye [Iodinated Diagnostic Agents] Nausea And Vomiting    Outpatient Encounter Medications as of 03/20/2019  Medication Sig  . acetaminophen (TYLENOL) 325 MG tablet Take 650 mg by mouth as needed for moderate pain (arthritis pain). Reported on 08/18/2015  . AMBULATORY NON FORMULARY MEDICATION Medication Name: ultra zyme- Take 1 capsule twice daily  . ascorbic acid  (VITAMIN C) 500 MG tablet Take 500 mg by mouth daily. Reported on 08/26/2015  . Calcium Carbonate (CALCIUM 600 PO) Take 600 mg by mouth daily.  . Cholecalciferol (VITAMIN D-3) 1000 units CAPS Take 1,000 Units by mouth daily.   . furosemide (LASIX) 20 MG tablet Take 1 tablet (20 mg total) by mouth as needed (swelling).  . Omega 3 1200 MG CAPS Take 1,200 mg by mouth at bedtime.   . [DISCONTINUED] PREMARIN vaginal cream Apply 1 Applicatorful topically 2 (two) times a week.    No facility-administered encounter medications on file as of 03/20/2019.     Review of Systems  Constitutional: Positive for fatigue. Negative for appetite change, chills and fever.  HENT: Negative for congestion, rhinorrhea, sinus pressure, sinus pain, sneezing and sore throat.   Eyes: Positive for visual disturbance. Negative for pain, discharge, redness and itching.       Wears eye glasses   Respiratory: Negative for cough, chest tightness, shortness of breath and wheezing.   Cardiovascular: Positive for leg swelling. Negative for chest pain and palpitations.       Has a pacemaker   Gastrointestinal: Negative for abdominal distention, abdominal pain, constipation, diarrhea, nausea and vomiting.  Genitourinary: Negative for difficulty urinating, dysuria and urgency.  Musculoskeletal: Positive for arthralgias. Negative for gait problem.  Skin: Negative for color change, pallor and rash.  Neurological: Negative for dizziness, weakness, light-headedness, numbness and headaches.  Psychiatric/Behavioral: Negative for agitation and sleep disturbance. The patient is not nervous/anxious.     Immunization History  Administered Date(s) Administered  . Influenza Split 09/11/2012, 04/12/2013, 04/30/2015  . Influenza, High Dose Seasonal PF 04/18/2017  . Influenza,inj,Quad PF,6+ Mos 04/13/2018  . Influenza-Unspecified 09/19/2012, 05/03/2014, 04/10/2015, 04/22/2016, 04/20/2017  . Pneumococcal Conjugate-13 04/16/2015  .  Pneumococcal Polysaccharide-23 07/12/2004  . Td 01/10/2012  . Tdap 11/05/2015   Pertinent  Health Maintenance Due  Topic Date Due  . INFLUENZA VACCINE  02/10/2019  . DEXA SCAN  Completed  . PNA vac Low Risk Adult  Completed   Fall Risk  03/20/2019 01/24/2019 07/19/2018 06/01/2018 06/16/2017  Falls in the past year? 0 0 1 1 Yes  Comment - - - Emmi Telephone Survey: data to providers prior to load Emmi Telephone Survey: data to providers prior to load  Number falls in past yr: 0 0 0 1 1  Comment - - - Emmi Telephone Survey Actual Response = 1 Emmi Telephone Survey Actual Response = 1  Injury with Fall? 0 0 1 0 No    Vitals:   03/20/19 1356  BP: 102/68  Pulse: (!) 101  Temp: (!) 96.9 F (36.1 C)  TempSrc: Temporal  SpO2: 97%  Weight: 135 lb 6.4 oz (61.4 kg)  Height: 5' (1.524 m)   Body mass index is 26.44 kg/m. Physical Exam Constitutional:      General: She is not in acute distress.  Appearance: She is overweight. She is not ill-appearing.  HENT:     Head: Normocephalic.     Right Ear: Tympanic membrane, ear canal and external ear normal. There is no impacted cerumen.     Left Ear: Tympanic membrane, ear canal and external ear normal. There is no impacted cerumen.     Nose: Nose normal. No congestion or rhinorrhea.     Mouth/Throat:     Mouth: Mucous membranes are moist.     Pharynx: Oropharynx is clear. No oropharyngeal exudate or posterior oropharyngeal erythema.  Eyes:     General: No scleral icterus.       Right eye: No discharge.        Left eye: No discharge.     Conjunctiva/sclera: Conjunctivae normal.     Pupils: Pupils are equal, round, and reactive to light.  Neck:     Musculoskeletal: Neck supple. No neck rigidity or muscular tenderness.  Cardiovascular:     Rate and Rhythm: Normal rate and regular rhythm.  Pulmonary:     Effort: Pulmonary effort is normal. No respiratory distress.     Breath sounds: Normal breath sounds. No wheezing, rhonchi or rales.   Chest:     Chest wall: No tenderness.  Abdominal:     General: Bowel sounds are normal. There is no distension.     Palpations: Abdomen is soft. There is no mass.     Tenderness: There is no abdominal tenderness. There is no right CVA tenderness, left CVA tenderness, guarding or rebound.  Musculoskeletal:        General: No swelling.     Right shoulder: She exhibits decreased range of motion, crepitus and pain. She exhibits no tenderness, no swelling, no effusion, no deformity, normal pulse and normal strength.     Left shoulder: She exhibits decreased range of motion, tenderness, crepitus and pain. She exhibits no swelling, no effusion, no deformity, normal pulse and normal strength.     Right knee: She exhibits decreased range of motion. She exhibits no swelling, no effusion, no erythema and normal patellar mobility.     Left knee: She exhibits decreased range of motion. She exhibits no swelling, no effusion, no erythema and normal patellar mobility.     Right lower leg: Edema present.     Left lower leg: Edema present.     Comments: Unsteady gait though walks without any assistive devices.   Lymphadenopathy:     Cervical: No cervical adenopathy.  Skin:    General: Skin is warm and dry.     Coloration: Skin is not pale.     Findings: No bruising, erythema or rash.  Neurological:     Mental Status: She is alert and oriented to person, place, and time.     Cranial Nerves: No cranial nerve deficit.     Sensory: No sensory deficit.     Motor: No weakness.     Coordination: Coordination normal.     Gait: Gait abnormal.  Psychiatric:        Mood and Affect: Mood normal.        Behavior: Behavior normal.        Thought Content: Thought content normal.        Judgment: Judgment normal.     Labs reviewed: Recent Labs    08/10/18 0800 02/26/19 0000  NA 146 144  K 4.0 3.9  CL 108 108  CO2 29 29  GLUCOSE 111* 117*  BUN 25 28*  CREATININE 0.69 0.58*  CALCIUM 10.3 9.8   Recent  Labs    08/10/18 0800 02/26/19 0000  AST 15 13  ALT 11 7  BILITOT 0.4 0.4  PROT 6.7 6.0*   Recent Labs    08/10/18 0800 02/26/19 0000  WBC 4.7 5.3  NEUTROABS  --  3,620  HGB 12.0 11.6*  HCT 35.8 34.5*  MCV 95.5 99.1  PLT 123* 137*   Lab Results  Component Value Date   TSH 0.62 02/26/2019   Lab Results  Component Value Date   HGBA1C 5.4 02/26/2019   Lab Results  Component Value Date   CHOL 129 02/26/2019   HDL 39 (L) 02/26/2019   LDLCALC 76 02/26/2019   TRIG 65 02/26/2019   CHOLHDL 3.3 02/26/2019    Significant Diagnostic Results in last 30 days:  No results found.  Assessment/Plan 1. Polymyalgia rheumatica (HCC) Generalized myalgia with morning stiffness.Afebrile.Decreased ROM with crepitus to shoulders.No swollen or redness of joint.Has had similar symptoms in the past was treated with prednisone. - Sedimentation Rate - C-reactive Protein - CBC with Differential/Platelet - predniSONE (DELTASONE) 10 MG tablet; Take 1 tablet (10 mg total) by mouth daily with breakfast.  Dispense: 30 tablet; Refill: 0  2. Bilateral leg edema Trace -1+edema.encouraged to elevate legs above heart level when seated.Knee high ted hose on in the morning and off at bedtime.continue on Lasix 20 mg tablet daily as needed.   3. Unsteady gait Holds onto furniture.can benefit from a cane but none desired for now.fall and safety precautions discussed.  Family/ staff Communication: Reviewed plan of care with patient.  Labs/tests ordered: CBC,Sed rate and CRP   Niema Carrara C Reyli Schroth, NP

## 2019-03-21 ENCOUNTER — Other Ambulatory Visit: Payer: Self-pay

## 2019-03-21 DIAGNOSIS — M353 Polymyalgia rheumatica: Secondary | ICD-10-CM

## 2019-03-21 LAB — CBC WITH DIFFERENTIAL/PLATELET
Absolute Monocytes: 552 cells/uL (ref 200–950)
Basophils Absolute: 48 cells/uL (ref 0–200)
Basophils Relative: 0.7 %
Eosinophils Absolute: 62 cells/uL (ref 15–500)
Eosinophils Relative: 0.9 %
HCT: 29.4 % — ABNORMAL LOW (ref 35.0–45.0)
Hemoglobin: 9.8 g/dL — ABNORMAL LOW (ref 11.7–15.5)
Lymphs Abs: 1028 cells/uL (ref 850–3900)
MCH: 32.7 pg (ref 27.0–33.0)
MCHC: 33.3 g/dL (ref 32.0–36.0)
MCV: 98 fL (ref 80.0–100.0)
MPV: 10.2 fL (ref 7.5–12.5)
Monocytes Relative: 8 %
Neutro Abs: 5210 cells/uL (ref 1500–7800)
Neutrophils Relative %: 75.5 %
Platelets: 152 10*3/uL (ref 140–400)
RBC: 3 10*6/uL — ABNORMAL LOW (ref 3.80–5.10)
RDW: 12.5 % (ref 11.0–15.0)
Total Lymphocyte: 14.9 %
WBC: 6.9 10*3/uL (ref 3.8–10.8)

## 2019-03-21 LAB — SEDIMENTATION RATE: Sed Rate: 51 mm/h — ABNORMAL HIGH (ref 0–30)

## 2019-03-21 LAB — C-REACTIVE PROTEIN: CRP: 11.9 mg/L — ABNORMAL HIGH (ref <8.0)

## 2019-03-23 ENCOUNTER — Telehealth: Payer: Self-pay | Admitting: *Deleted

## 2019-03-23 NOTE — Telephone Encounter (Signed)
Received fax from [facility] [phone] for CPAP Supplies. Placed in Dr. Steve Rattler box to review and sign. To be faxed back to Fax: [phone] ID: [id number]

## 2019-04-09 ENCOUNTER — Other Ambulatory Visit: Payer: Medicare Other

## 2019-04-09 ENCOUNTER — Other Ambulatory Visit: Payer: Self-pay

## 2019-04-09 DIAGNOSIS — M353 Polymyalgia rheumatica: Secondary | ICD-10-CM | POA: Diagnosis not present

## 2019-04-09 LAB — CBC WITH DIFFERENTIAL/PLATELET
Absolute Monocytes: 600 cells/uL (ref 200–950)
Basophils Absolute: 60 cells/uL (ref 0–200)
Basophils Relative: 1 %
Eosinophils Absolute: 60 cells/uL (ref 15–500)
Eosinophils Relative: 1 %
HCT: 30.4 % — ABNORMAL LOW (ref 35.0–45.0)
Hemoglobin: 9.7 g/dL — ABNORMAL LOW (ref 11.7–15.5)
Lymphs Abs: 2412 cells/uL (ref 850–3900)
MCH: 31.2 pg (ref 27.0–33.0)
MCHC: 31.9 g/dL — ABNORMAL LOW (ref 32.0–36.0)
MCV: 97.7 fL (ref 80.0–100.0)
MPV: 10 fL (ref 7.5–12.5)
Monocytes Relative: 10 %
Neutro Abs: 2868 cells/uL (ref 1500–7800)
Neutrophils Relative %: 47.8 %
Platelets: 170 10*3/uL (ref 140–400)
RBC: 3.11 10*6/uL — ABNORMAL LOW (ref 3.80–5.10)
RDW: 13.1 % (ref 11.0–15.0)
Total Lymphocyte: 40.2 %
WBC: 6 10*3/uL (ref 3.8–10.8)

## 2019-04-11 ENCOUNTER — Encounter: Payer: Self-pay | Admitting: Internal Medicine

## 2019-04-11 ENCOUNTER — Non-Acute Institutional Stay: Payer: Medicare Other | Admitting: Internal Medicine

## 2019-04-11 ENCOUNTER — Other Ambulatory Visit: Payer: Self-pay

## 2019-04-11 VITALS — BP 140/80 | HR 103 | Temp 97.2°F | Resp 20 | Ht 60.0 in | Wt 125.8 lb

## 2019-04-11 DIAGNOSIS — R634 Abnormal weight loss: Secondary | ICD-10-CM | POA: Diagnosis not present

## 2019-04-11 DIAGNOSIS — R2681 Unsteadiness on feet: Secondary | ICD-10-CM

## 2019-04-11 DIAGNOSIS — M353 Polymyalgia rheumatica: Secondary | ICD-10-CM

## 2019-04-11 DIAGNOSIS — R600 Localized edema: Secondary | ICD-10-CM

## 2019-04-11 DIAGNOSIS — R1319 Other dysphagia: Secondary | ICD-10-CM

## 2019-04-11 DIAGNOSIS — R1310 Dysphagia, unspecified: Secondary | ICD-10-CM

## 2019-04-11 DIAGNOSIS — I495 Sick sinus syndrome: Secondary | ICD-10-CM | POA: Diagnosis not present

## 2019-04-11 DIAGNOSIS — M810 Age-related osteoporosis without current pathological fracture: Secondary | ICD-10-CM | POA: Diagnosis not present

## 2019-04-11 DIAGNOSIS — D649 Anemia, unspecified: Secondary | ICD-10-CM | POA: Diagnosis not present

## 2019-04-11 MED ORDER — PREDNISONE 10 MG PO TABS: 10.0000 mg | ORAL_TABLET | Freq: Every day | ORAL | 0 refills | Status: DC

## 2019-04-11 MED ORDER — PREDNISONE 5 MG PO TABS: 7.5000 mg | ORAL_TABLET | Freq: Every day | ORAL | 0 refills | Status: DC

## 2019-04-11 NOTE — Progress Notes (Signed)
Location: [place] of Service:  Clinic (12)  Provider:   Code Status:  Goals of Care:  Advanced Directives 01/24/2019  Does Patient Have a Medical Advance Directive? Yes  Type of Advance Directive Out of facility DNR (pink MOST or yellow form);Healthcare Power of Attorney  Does patient want to make changes to medical advance directive? No - Patient declined  Copy of [place] in Chart? -  Would patient like information on creating a medical advance directive? -  Pre-existing out of facility DNR order (yellow form or pink MOST form) Yellow form placed in chart (order not valid for inpatient use);Pink MOST form placed in chart (order not valid for inpatient use)     Chief Complaint  Patient presents with  . Medical Management of Chronic Issues    6 week f/u- polymylagia    HPI: Patient is a 85 y.o. female seen today for an acute visit for Follow up for her Polymyalgia Rheumatica Patient has a history of dysphagia s/p dilatation,  Sick sinus syndrome status post pacemaker.  Follows with cardiology Age-related osteoporosis.  Only on vitamin D and calcium Urinary incontinence on Premarin Inguinal hernia had seen surgeon but not sure if she wants to go through surgery Hyperlipidemia not on any statin LDL less than 100  Patient has a history of polymyalgia rheumatica.  Diagnosed in 2012.  She said that she was called by rheumatologist in the [facility].  And was on prednisone for 5 to 6 years. Recently she was seen in our office with symptoms of PMR.  She said that she was not feeling good had pain in her muscles weakness and inability to walk.  Was started on prednisone 10 mg.  Patient responded after few weeks.  Her sed rate and CRP were elevated. Today patient states she feels she is back to her baseline. She denies any symptoms of headache jaw claudication or vision problems. Patient has not had any falls but she has been mostly staying inside her  apartment.   Past Medical History:  Diagnosis Date  . Achalasia   . Anemia   . Arthritis    back  . Chronic steroid use   . Complete heart block ([place])    S/P PACEMAKER 2000 W/ GENERATOR CHANGE 2009  . Empyema, right ([place]) PULMOLOGIST-- DR CLANCE   VATS 06/23/2012 cultures negative to date CXR 07/19/12 persistent airfluid levels/  CXR 11-01-2012 IMPROVE RIGHT PLEURAL EFFUSION  . GERD (gastroesophageal reflux disease)   . History of aspiration pneumonitis    DEC 2013  . History of hiatal hernia   . Hypertension   . Inguinal hernia    right  . Intrinsic urethral sphincter deficiency   . Megaesophagus   . Mixed stress and urge urinary incontinence   . Multinodular thyroid 06/26/2012   Multi nodular goiter. Large nodules in both lobes of the gland.  These nodules fit national criteria for fine needle aspiration  biopsy if not previously assessed.    . OSA on CPAP    cpap, doees not know settings  . Polymyalgia rheumatica (HCC)    on chronic Prednisone 1m daily  . RBBB   . S/P dilatation of esophageal stricture     Past Surgical History:  Procedure Laterality Date  . APPENDECTOMY  1953   w/ removal benign kidney tumor   . BALLOON DILATION  07/24/2012   Procedure: BALLOON DILATION;  Surgeon: RInda Castle MD;  Location: [facility];  Service: Endoscopy;  Laterality: N/A;  . BALLOON DILATION N/A 12/03/2015   Procedure: BALLOON DILATION;  Surgeon: Mauri Pole, MD;  Location: [facility];  Service: Endoscopy;  Laterality: N/A;  pnuematic balloon  . BALLOON DILATION N/A 03/16/2017   Procedure: BALLOON DILATION;  Surgeon: Mauri Pole, MD;  Location: [facility];  Service: Endoscopy;  Laterality: N/A;  PNUEMATIC BALLOONS  . BOTOX INJECTION  08/07/2012   Procedure: BOTOX INJECTION;  Surgeon: Inda Castle, MD;  Location: [facility];  Service: Endoscopy;  Laterality: N/A;  . BOTOX INJECTION N/A 05/24/2013   Procedure: MACROPLASTIQUE IMPLANT;  Surgeon: Irine Seal,  MD;  Location: [facility];  Service: Urology;  Laterality: N/A;  . BOTOX INJECTION  02/25/2014   Procedure: BOTOX INJECTION;  Surgeon: Inda Castle, MD;  Location: [facility];  Service: Endoscopy;;  . CARDIAC PACEMAKER PLACEMENT  06/1999  DR RUTH GREENFIELD AT [facility]  ( LAST PACER CHECK 05-09-2013) for CHB/   END-OF-LIFE GENERATOR CHANGE  2009  . CATARACT EXTRACTION W/ INTRAOCULAR LENS  IMPLANT, BILATERAL  2005  . DILATION AND CURETTAGE OF UTERUS    . ESOPHAGOGASTRODUODENOSCOPY  07/24/2012   Procedure: ESOPHAGOGASTRODUODENOSCOPY (EGD);  Surgeon: Inda Castle, MD;  Location: [facility];  Service: Endoscopy;  Laterality: N/A;  . ESOPHAGOGASTRODUODENOSCOPY  08/07/2012   Procedure: ESOPHAGOGASTRODUODENOSCOPY (EGD);  Surgeon: Inda Castle, MD;  Location: [facility];  Service: Endoscopy;  Laterality: N/A;  . ESOPHAGOGASTRODUODENOSCOPY N/A 02/25/2014   Procedure: ESOPHAGOGASTRODUODENOSCOPY (EGD);  Surgeon: Inda Castle, MD;  Location: [facility];  Service: Endoscopy;  Laterality: N/A;  . ESOPHAGOGASTRODUODENOSCOPY N/A 03/16/2017   Procedure: ESOPHAGOGASTRODUODENOSCOPY (EGD);  Surgeon: Mauri Pole, MD;  Location: [facility] END[facility]COPY;  Service: Endoscopy;  Laterality: N/A;  . ESOPHAGOGASTRODUODENOSCOPY N/A 08/02/2018   Procedure: ESOPHAGOGASTRODUODENOSCOPY (EGD);  Surgeon: Mauri Pole, MD;  Location: [facility];  Service: Endoscopy;  Laterality: N/A;  . ESOPHAGOGASTRODUODENOSCOPY (EGD) WITH ESOPHAGEAL DILATION  06/27/2012   Procedure: ESOPHAGOGASTRODUODENOSCOPY (EGD) WITH ESOPHAGEAL DILATION;  Surgeon: Ladene Artist, MD,FACG;  Location: [facility];  Service: Endoscopy;  Laterality: N/A;  . ESOPHAGOGASTRODUODENOSCOPY (EGD) WITH PROPOFOL N/A 10/09/2015   Procedure: ESOPHAGOGASTRODUODENOSCOPY (EGD) WITH PROPOFOL ( WITH BOTOX);  Surgeon: Milus Banister, MD;  Location: [facility];  Service: Endoscopy;  Laterality: N/A;  . ESOPHAGOGASTRODUODENOSCOPY  (EGD) WITH PROPOFOL N/A 12/03/2015   Procedure: ESOPHAGOGASTRODUODENOSCOPY (EGD) WITH PROPOFOL;  Surgeon: Mauri Pole, MD;  Location: [facility];  Service: Endoscopy;  Laterality: N/A;  . FOREIGN BODY REMOVAL  08/02/2018   Procedure: FOREIGN BODY REMOVAL;  Surgeon: Mauri Pole, MD;  Location: [facility];  Service: Endoscopy;;  . PACEMAKER GENERATOR CHANGE  12/13/2007   at [facility]  . TOE SURGERY  2013   left 3rd toe HAMMERTOE REPAIR  . TONSILLECTOMY  AS CHILD  . TOTAL HIP ARTHROPLASTY Right 2001  . VERICOSE VEIN LIGATION    . VIDEO ASSISTED THORACOSCOPY (VATS)/DECORTICATION  06/23/2012   Procedure: VIDEO ASSISTED THORACOSCOPY (VATS)/DECORTICATION;  Surgeon: Grace Isaac, MD;  Location: [place];  Service: Thoracic;  Laterality: Right;  Marland Kitchen VIDEO ASSISTED THORACOSCOPY (VATS)/EMPYEMA     06/23/2012  . VIDEO BRONCHOSCOPY  06/23/2012   Procedure: VIDEO BRONCHOSCOPY;  Surgeon: Grace Isaac, MD;  Location: [facility] OR;  Service: Thoracic;  Laterality: N/A;    Allergies  Allergen Reactions  . Actonel [Risedronate Sodium] Other (See Comments)    Joint aches; rechallenged --caused joint aches  . Ivp Dye [Iodinated Diagnostic Agents] Nausea And Vomiting  Outpatient Encounter Medications as of 04/11/2019  Medication Sig  . acetaminophen (TYLENOL) 325 MG tablet Take 650 mg by mouth as needed for moderate pain (arthritis pain). Reported on 08/18/2015  . AMBULATORY NON FORMULARY MEDICATION Medication Name: ultra zyme- Take 1 capsule twice daily  . ascorbic acid (VITAMIN C) 500 MG tablet Take 500 mg by mouth daily. Reported on 08/26/2015  . Calcium Carbonate (CALCIUM 600 PO) Take 600 mg by mouth daily.  . Cholecalciferol (VITAMIN D-3) 1000 units CAPS Take 1,000 Units by mouth daily.   . furosemide (LASIX) 20 MG tablet Take 1 tablet (20 mg total) by mouth as needed (swelling).  . Omega 3 1200 MG CAPS Take 1,200 mg by mouth at bedtime.   . predniSONE (DELTASONE) 10 MG tablet Take 1 tablet  (10 mg total) by mouth daily with breakfast.  . [DISCONTINUED] predniSONE (DELTASONE) 10 MG tablet Take 1 tablet (10 mg total) by mouth daily with breakfast.  . predniSONE (DELTASONE) 5 MG tablet Take 1.5 tablets (7.5 mg total) by mouth daily with breakfast.   No facility-administered encounter medications on file as of 04/11/2019.     Review of Systems:  Review of Systems  Constitutional: Positive for activity change, appetite change and unexpected weight change.  HENT: Negative.   Cardiovascular: Positive for leg swelling.  Genitourinary: Negative.   Musculoskeletal: Positive for arthralgias, back pain, gait problem and myalgias.  Skin: Negative.   Psychiatric/Behavioral: Negative.   All other systems reviewed and are negative.   Health Maintenance  Topic Date Due  . INFLUENZA VACCINE  02/10/2019  . TETANUS/TDAP  11/04/2025  . DEXA SCAN  Completed  . PNA vac Low Risk Adult  Completed    Physical Exam: Vitals:   04/11/19 1334  BP: 140/80  Pulse: (!) 103  Resp: 20  Temp: (!) 97.2 F (36.2 C)  SpO2: 98%  Weight: 125 lb 12.8 oz (57.1 kg)  Height: 5' (1.524 m)   Body mass index is 24.57 kg/m. Physical Exam Vitals signs reviewed.  Constitutional:      Appearance: Normal appearance.  HENT:     Head: Normocephalic.     Nose: Nose normal.     Mouth/Throat:     Mouth: Mucous membranes are moist.     Pharynx: Oropharynx is clear.  Eyes:     Pupils: Pupils are equal, round, and reactive to light.  Neck:     Musculoskeletal: Neck supple.  Cardiovascular:     Rate and Rhythm: Normal rate.     Pulses: Normal pulses.     Heart sounds: Murmur present.  Pulmonary:     Effort: Pulmonary effort is normal. No respiratory distress.     Breath sounds: Normal breath sounds. No wheezing or rales.  Abdominal:     General: Abdomen is flat. Bowel sounds are normal.     Palpations: Abdomen is soft.  Musculoskeletal:        General: Swelling present.  Skin:    General: Skin is  warm and dry.  Neurological:     General: No focal deficit present.     Mental Status: She is alert.     Comments: She was little Stiff getting up from her Exam table. She was walking with Limp. And was very slow with Kyphosis     Labs reviewed: Basic Metabolic Panel: Recent Labs    08/10/18 0800 02/26/19 0000  NA 146 144  K 4.0 3.9  CL 108 108  CO2 29 29  GLUCOSE 111* 117*  BUN 25 28*  CREATININE 0.69 0.58*  CALCIUM 10.3 9.8  TSH 0.88 0.62   Liver Function Tests: Recent Labs    08/10/18 0800 02/26/19 0000  AST 15 13  ALT 11 7  BILITOT 0.4 0.4  PROT 6.7 6.0*   No results for input(s): LIPASE, AMYLASE in the last 8760 hours. No results for input(s): AMMONIA in the last 8760 hours. CBC: Recent Labs    02/26/19 0000 03/20/19 1439 04/09/19 0000  WBC 5.3 6.9 6.0  NEUTROABS 3,620 5,210 2,868  HGB 11.6* 9.8* 9.7*  HCT 34.5* 29.4* 30.4*  MCV 99.1 98.0 97.7  PLT 137* 152 170   Lipid Panel: Recent Labs    02/26/19 0000  CHOL 129  HDL 39*  LDLCALC 76  TRIG 65  CHOLHDL 3.3   Lab Results  Component Value Date   HGBA1C 5.4 02/26/2019    Procedures since last visit: No results found.  Assessment/Plan 1. Polymyalgia rheumatica (HCC) Will Decrease her dose of Prednisone to 7.5 mg Qd I told her to Incrase back to 10 mg if her symptoms caome back She will call our office to update [place] otherwise she will come and see me in 4 weeks She does not want to see Rheumatology Right now. No Concerning symptoms of Giant Cell arteritis If Patient does not get Better will make referal   predniSONE (DELTASONE) 10 MG tablet; Take 1 tablet (10 mg total) by mouth daily with breakfast.  Dispense: 30 tablet; Refill: 0 - Sedimentation Rate; Future - C-reactive Protein; Future - Vitamin B12; Future .  Anemia, unspecified type This is new  She denies any Blood in her Stool Will get her Iron studies and B12  - CBC (no diff); Future - Ferritin; Future - Iron and TIBC;  Future Weight Loss Patient has lost 10 lbs in past few months. She says she has not appetite. Will continue to monitor   Bilateral leg edema Takes Lasix as needed - CMP with eGFR([facility]); Future . Unsteady gait I recommended agin that she needs to use cane - Vitamin B12; Future   Sick sinus syndrome ([place]) Follows with Cardiology  Dysphagia S/P Dilatation in 11/19 Continues to have issues. Tries to be careful with her Eating If continues to loose weight will send her back to GI   Age-related osteoporosis without current pathological fracture Patient has agreed for Prolia Will let us know next time - Vitamin D, 1,25-dihydroxy; Future    Other issues  Hyperlipidemia- Last LDL was 76  Hyperglycemia - Plan: Hemoglobin A1c , 5.4  Multinodular thyroid - Plan: TSH,  Inguinal Hernia D/W patient her option of Following back with her surgeon or not doing anything She is going to d/w her daughter  Labs/tests ordered:  * No order type specified * Next appt:  05/10/2019 Total time spent in this patient care encounter was  40_  minutes; greater than 50% of the visit spent counseling patient and staff, reviewing records , Labs and coordinating care for problems addressed at this encounter.

## 2019-05-02 ENCOUNTER — Encounter: Payer: Self-pay | Admitting: Internal Medicine

## 2019-05-02 ENCOUNTER — Non-Acute Institutional Stay: Payer: Medicare Other | Admitting: Internal Medicine

## 2019-05-02 ENCOUNTER — Other Ambulatory Visit: Payer: Self-pay

## 2019-05-02 VITALS — BP 104/58 | HR 118 | Temp 97.9°F | Ht 60.0 in | Wt 120.0 lb

## 2019-05-02 DIAGNOSIS — M353 Polymyalgia rheumatica: Secondary | ICD-10-CM | POA: Diagnosis not present

## 2019-05-02 DIAGNOSIS — R634 Abnormal weight loss: Secondary | ICD-10-CM | POA: Diagnosis not present

## 2019-05-02 DIAGNOSIS — D649 Anemia, unspecified: Secondary | ICD-10-CM

## 2019-05-02 DIAGNOSIS — R600 Localized edema: Secondary | ICD-10-CM

## 2019-05-02 DIAGNOSIS — R443 Hallucinations, unspecified: Secondary | ICD-10-CM

## 2019-05-02 DIAGNOSIS — R1310 Dysphagia, unspecified: Secondary | ICD-10-CM

## 2019-05-02 DIAGNOSIS — R2681 Unsteadiness on feet: Secondary | ICD-10-CM

## 2019-05-02 DIAGNOSIS — R1319 Other dysphagia: Secondary | ICD-10-CM

## 2019-05-02 NOTE — Progress Notes (Signed)
Location: [place] of Service:  Clinic (12)  Provider:   Code Status:  Goals of Care:  Advanced Directives 01/24/2019  Does Patient Have a Medical Advance Directive? Yes  Type of Advance Directive Out of facility DNR (pink MOST or yellow form);Healthcare Power of Attorney  Does patient want to make changes to medical advance directive? No - Patient declined  Copy of [place] in Chart? -  Would patient like information on creating a medical advance directive? -  Pre-existing out of facility DNR order (yellow form or pink MOST form) Yellow form placed in chart (order not valid for inpatient use);Pink MOST form placed in chart (order not valid for inpatient use)     Chief Complaint  Patient presents with  . Medical Management of Chronic Issues    Follow up. Patient's husband states the patient isn't quite aware of reality and thinking people are around when they are not there. They relate it to the prednisone.     HPI: Patient is a 85 y.o. female seen today for an acute visit for hallucinations, weakness  Patient has a history ofdysphagia s/p dilatation,  Sick sinus syndrome status post pacemaker. Follows with cardiology Age-related osteoporosis. Only on vitamin D and calcium Urinary incontinence on Premarin Inguinal hernia   PMR  She was diagnosed with PMR again Recently when she presented with pain and stiffness.  She was started on 10 mg of prednisone.  She got better but had continuous weakness.  We have decreased the dose to 7.5 mg.  But now she is having some symptoms of hallucinations.  Her husband said that she would sometimes ask him if there are children in the room.  She is also continues to have difficulty ambulating.  Specially getting up. She is able to do her ADLs right now.  But has become very slow.  Is walking with a walker.  Has not had any falls Weight Loss with Dysphagia Her other problem seems to be significant  weight  loss and dysphagia.  She was  dilated by GI in 1/20.  She continues to have issues with dysphagia dishonesty GI yet     Past Medical History:  Diagnosis Date  . Achalasia   . Anemia   . Arthritis    back  . Chronic steroid use   . Complete heart block ([place])    S/P PACEMAKER 2000 W/ GENERATOR CHANGE 2009  . Empyema, right ([place]) PULMOLOGIST-- DR CLANCE   VATS 06/23/2012 cultures negative to date CXR 07/19/12 persistent airfluid levels/  CXR 11-01-2012 IMPROVE RIGHT PLEURAL EFFUSION  . GERD (gastroesophageal reflux disease)   . History of aspiration pneumonitis    DEC 2013  . History of hiatal hernia   . Hypertension   . Inguinal hernia    right  . Intrinsic urethral sphincter deficiency   . Megaesophagus   . Mixed stress and urge urinary incontinence   . Multinodular thyroid 06/26/2012   Multi nodular goiter. Large nodules in both lobes of the gland.  These nodules fit national criteria for fine needle aspiration  biopsy if not previously assessed.    . OSA on CPAP    cpap, doees not know settings  . Polymyalgia rheumatica (HCC)    on chronic Prednisone 45m daily  . RBBB   . S/P dilatation of esophageal stricture     Past Surgical History:  Procedure Laterality Date  . APPENDECTOMY  1953   w/ removal benign kidney  tumor   . BALLOON DILATION  07/24/2012   Procedure: BALLOON DILATION;  Surgeon: Inda Castle, MD;  [facility];  Service: Endoscopy;  Laterality: N/A;  . BALLOON DILATION N/A 12/03/2015   Procedure: BALLOON DILATION;  Surgeon: Mauri Pole, MD;  Location: [place] [facility];  Service: Endoscopy;  Laterality: N/A;  pnuematic balloon  . BALLOON DILATION N/A 03/16/2017   Procedure: BALLOON DILATION;  Surgeon: Mauri Pole, MD;  Location: [place] [facility];  Service: Endoscopy;  Laterality: N/A;  PNUEMATIC BALLOONS  . BOTOX INJECTION  08/07/2012   Procedure: BOTOX INJECTION;  Surgeon: Inda Castle, MD;  [facility];  Service: Endoscopy;   Laterality: N/A;  . BOTOX INJECTION N/A 05/24/2013   Procedure: MACROPLASTIQUE IMPLANT;  Surgeon: Irine Seal, MD;  Location: [facility];  Service: Urology;  Laterality: N/A;  . BOTOX INJECTION  02/25/2014   Procedure: BOTOX INJECTION;  Surgeon: Inda Castle, MD;  [facility];  Service: Endoscopy;;  . CARDIAC PACEMAKER PLACEMENT  06/1999  DR RUTH GREENFIELD AT [place]  ( LAST PACER CHECK 05-09-2013) for CHB/   END-OF-LIFE GENERATOR CHANGE  2009  . CATARACT EXTRACTION W/ INTRAOCULAR LENS  IMPLANT, BILATERAL  2005  . DILATION AND CURETTAGE OF UTERUS    . ESOPHAGOGASTRODUODENOSCOPY  07/24/2012   Procedure: ESOPHAGOGASTRODUODENOSCOPY (EGD);  Surgeon: Inda Castle, MD;  [facility];  Service: Endoscopy;  Laterality: N/A;  . ESOPHAGOGASTRODUODENOSCOPY  08/07/2012   Procedure: ESOPHAGOGASTRODUODENOSCOPY (EGD);  Surgeon: Inda Castle, MD;  [facility];  Service: Endoscopy;  Laterality: N/A;  . ESOPHAGOGASTRODUODENOSCOPY N/A 02/25/2014   Procedure: ESOPHAGOGASTRODUODENOSCOPY (EGD);  Surgeon: Inda Castle, MD;  [facility]: [facility];  Service: Endoscopy;  Laterality: N/A;  . ESOPHAGOGASTRODUODENOSCOPY N/A 03/16/2017   Procedure: ESOPHAGOGASTRODUODENOSCOPY (EGD);  Surgeon: Mauri Pole, MD;  Location: [facility] ENDOSCOPY;  Service: Endoscopy;  Laterality: N/A;  . ESOPHAGOGASTRODUODENOSCOPY N/A 08/02/2018   Procedure: ESOPHAGOGASTRODUODENOSCOPY (EGD);  Surgeon: Mauri Pole, MD;  [facility];  Service: Endoscopy;  Laterality: N/A;  . ESOPHAGOGASTRODUODENOSCOPY (EGD) WITH ESOPHAGEAL DILATION  06/27/2012   Procedure: ESOPHAGOGASTRODUODENOSCOPY (EGD) WITH ESOPHAGEAL DILATION;  Surgeon: Ladene Artist, MD,FACG;  Location: [place];  Service: Endoscopy;  Laterality: N/A;  . ESOPHAGOGASTRODUODENOSCOPY (EGD) WITH PROPOFOL N/A 10/09/2015   Procedure: ESOPHAGOGASTRODUODENOSCOPY (EGD) WITH PROPOFOL ( WITH BOTOX);  Surgeon: Milus Banister, MD;  Location[facility];  Service: Endoscopy;  Laterality: N/A;  . ESOPHAGOGASTRODUODENOSCOPY (EGD) WITH PROPOFOL N/A 12/03/2015   Procedure: ESOPHAGOGASTRODUODENOSCOPY (EGD) WITH PROPOFOL;  Surgeon: Mauri Pole, MD;  Location: [place];  Service: Endoscopy;  Laterality: N/A;  . FOREIGN BODY REMOVAL  08/02/2018   Procedure: FOREIGN BODY REMOVAL;  Surgeon: Mauri Pole, MD;  [facility];  Service: Endoscopy;;  . PACEMAKER GENERATOR CHANGE  12/13/2007   at [facility]  . TOE SURGERY  2013   left 3rd toe HAMMERTOE REPAIR  . TONSILLECTOMY  AS CHILD  . TOTAL HIP ARTHROPLASTY Right 2001  . VERICOSE VEIN LIGATION    . VIDEO ASSISTED THORACOSCOPY (VATS)/DECORTICATION  06/23/2012   Procedure: VIDEO ASSISTED THORACOSCOPY (VATS)/DECORTICATION;  Surgeon: Grace Isaac, MD;  Location: [place];  Service: Thoracic;  Laterality: Right;  Marland Kitchen VIDEO ASSISTED THORACOSCOPY (VATS)/EMPYEMA     06/23/2012  . VIDEO BRONCHOSCOPY  06/23/2012   Procedure: VIDEO BRONCHOSCOPY;  Surgeon: Grace Isaac, MD;  Location: [facility] OR;  Service: Thoracic;  Laterality: N/A;    Allergies  Allergen Reactions  . Actonel [  Risedronate Sodium] Other (See Comments)    Joint aches; rechallenged --caused joint aches  . Ivp Dye [Iodinated Diagnostic Agents] Nausea And Vomiting    Outpatient Encounter Medications as of 05/02/2019  Medication Sig  . acetaminophen (TYLENOL) 325 MG tablet Take 650 mg by mouth as needed for moderate pain (arthritis pain). Reported on 08/18/2015  . AMBULATORY NON FORMULARY MEDICATION Medication Name: ultra zyme- Take 1 capsule twice daily  . ascorbic acid (VITAMIN C) 500 MG tablet Take 500 mg by mouth daily. Reported on 08/26/2015  . Calcium Carbonate (CALCIUM 600 PO) Take 600 mg by mouth daily.  . Cholecalciferol (VITAMIN D-3) 1000 units CAPS Take 1,000 Units by mouth daily.   . furosemide (LASIX) 20 MG tablet Take 1 tablet (20 mg total) by mouth as needed (swelling). (Patient  taking differently: Take 15 mg by mouth as needed (swelling). )  . Omega 3 1200 MG CAPS Take 1,200 mg by mouth at bedtime.   . predniSONE (DELTASONE) 5 MG tablet Take 1.5 tablets (7.5 mg total) by mouth daily with breakfast.  . [DISCONTINUED] predniSONE (DELTASONE) 10 MG tablet Take 1 tablet (10 mg total) by mouth daily with breakfast.   No facility-administered encounter medications on file as of 05/02/2019.     Review of Systems:  Review of Systems  Constitutional: Positive for activity change, appetite change and unexpected weight change.  HENT: Negative.   Respiratory: Negative.   Cardiovascular: Negative.   Gastrointestinal: Negative.   Genitourinary: Negative.   Musculoskeletal: Positive for gait problem.  Skin: Negative.   Neurological: Positive for weakness.  Psychiatric/Behavioral: Positive for hallucinations.    Health Maintenance  Topic Date Due  . INFLUENZA VACCINE  02/10/2019  . TETANUS/TDAP  11/04/2025  . DEXA SCAN  Completed  . PNA vac Low Risk Adult  Completed    Physical Exam: Vitals:   05/02/19 1307  BP: (!) 104/58  Pulse: (!) 118  Temp: 97.9 F (36.6 C)  SpO2: 91%  Weight: 120 lb (54.4 kg)  Height: 5' (1.524 m)   Body mass index is 23.44 kg/m. Physical Exam Vitals signs reviewed.  Constitutional:      Appearance: Normal appearance.  HENT:     Head: Normocephalic.     Nose: Nose normal.     Mouth/Throat:     Mouth: Mucous membranes are moist.     Pharynx: Oropharynx is clear.  Eyes:     Pupils: Pupils are equal, round, and reactive to light.  Neck:     Musculoskeletal: Neck supple.  Cardiovascular:     Rate and Rhythm: Tachycardia present.     Pulses: Normal pulses.     Heart sounds: Normal heart sounds.  Pulmonary:     Effort: Pulmonary effort is normal.     Breath sounds: Normal breath sounds.  Abdominal:     General: Abdomen is flat. Bowel sounds are normal.     Palpations: Abdomen is soft.  Musculoskeletal:     Comments: Mild  edema Bilateral  Skin:    General: Skin is warm and dry.  Neurological:     General: No focal deficit present.     Mental Status: She is alert and oriented to person, place, and time.     Comments: Has Proximal Muscle weakness. Walking but very slow. Gets tired easily  Psychiatric:        Mood and Affect: Mood normal.        Thought Content: Thought content normal.     Labs reviewed: Basic  Metabolic Panel: Recent Labs    08/10/18 0800 02/26/19 0000  NA 146 144  K 4.0 3.9  CL 108 108  CO2 29 29  GLUCOSE 111* 117*  BUN 25 28*  CREATININE 0.69 0.58*  CALCIUM 10.3 9.8  TSH 0.88 0.62   Liver Function Tests: Recent Labs    08/10/18 0800 02/26/19 0000  AST 15 13  ALT 11 7  BILITOT 0.4 0.4  PROT 6.7 6.0*   No results for input(s): LIPASE, AMYLASE in the last 8760 hours. No results for input(s): AMMONIA in the last 8760 hours. CBC: Recent Labs    02/26/19 0000 03/20/19 1439 04/09/19 0000  WBC 5.3 6.9 6.0  NEUTROABS 3,620 5,210 2,868  HGB 11.6* 9.8* 9.7*  HCT 34.5* 29.4* 30.4*  MCV 99.1 98.0 97.7  PLT 137* 152 170   Lipid Panel: Recent Labs    02/26/19 0000  CHOL 129  HDL 39*  LDLCALC 76  TRIG 65  CHOLHDL 3.3   Lab Results  Component Value Date   HGBA1C 5.4 02/26/2019    Procedures since last visit: No results found.  Assessment/Plan  Hallucinations It can be due to Prednisone. We will decrease the dose to 77m.  Will do Labs tomorrow PMR Will decrase the dose of Prednisone to 5 mg as patient is having Hallucinations  Will also have PT and ST consult Repeat ESR and CRP I have discussed with them and the facility nurse and her daughter to move her to assisted living or Acute unit for few weeks.   But at this time they do not want to change anything as her husband is also dependent on her. Possible looking in getting Caregivers  Patient is very frail at this time and at high risk of fall. She does not want to see a rheumatologist or go to the  hospital  Weight loss Has lost almost 20 lbs in past few months  She is not eating well per patient and her husband She will try Supplements Speech to evaluate Esophageal dysphagia Doe snot want to see GI yet but is having symptoms of Dysphagia  Anemia, unspecified type Repeat HGB pending  Bilateral leg edema Uses Lasix PRN Repeat BMP Pending H/o Sleep Apnea On CPAP Husband think is not using it properly Will see what Therapy says otherwise will get home health involved  Tachycardia Patient was tachycardic when initially came in. Patient had taken down to 110 at the end of the exam Will see what her Renal function is.    Labs/tests ordered:  Tomorrow and then follow up in 4 weeks Next appt:  05/03/2019 Total time spent in this patient care encounter was  _45  minutes; greater than 50% of the visit spent counseling patient and staff, reviewing records , Labs and coordinating care for problems addressed at this encounter.

## 2019-05-03 ENCOUNTER — Other Ambulatory Visit: Payer: Self-pay

## 2019-05-03 DIAGNOSIS — R600 Localized edema: Secondary | ICD-10-CM | POA: Diagnosis not present

## 2019-05-03 DIAGNOSIS — D649 Anemia, unspecified: Secondary | ICD-10-CM | POA: Diagnosis not present

## 2019-05-03 DIAGNOSIS — M810 Age-related osteoporosis without current pathological fracture: Secondary | ICD-10-CM | POA: Diagnosis not present

## 2019-05-03 DIAGNOSIS — R2681 Unsteadiness on feet: Secondary | ICD-10-CM | POA: Diagnosis not present

## 2019-05-03 DIAGNOSIS — M353 Polymyalgia rheumatica: Secondary | ICD-10-CM | POA: Diagnosis not present

## 2019-05-07 LAB — CBC
HCT: 30.2 % — ABNORMAL LOW (ref 35.0–45.0)
Hemoglobin: 9.5 g/dL — ABNORMAL LOW (ref 11.7–15.5)
MCH: 29.7 pg (ref 27.0–33.0)
MCHC: 31.5 g/dL — ABNORMAL LOW (ref 32.0–36.0)
MCV: 94.4 fL (ref 80.0–100.0)
MPV: 10.4 fL (ref 7.5–12.5)
Platelets: 182 10*3/uL (ref 140–400)
RBC: 3.2 10*6/uL — ABNORMAL LOW (ref 3.80–5.10)
RDW: 13.4 % (ref 11.0–15.0)
WBC: 5.5 10*3/uL (ref 3.8–10.8)

## 2019-05-07 LAB — COMPLETE METABOLIC PANEL WITH GFR
AG Ratio: 1.3 (calc) (ref 1.0–2.5)
ALT: 7 U/L (ref 6–29)
AST: 14 U/L (ref 10–35)
Albumin: 3.3 g/dL — ABNORMAL LOW (ref 3.6–5.1)
Alkaline phosphatase (APISO): 68 U/L (ref 37–153)
BUN/Creatinine Ratio: 45 (calc) — ABNORMAL HIGH (ref 6–22)
BUN: 24 mg/dL (ref 7–25)
CO2: 26 mmol/L (ref 20–32)
Calcium: 9.6 mg/dL (ref 8.6–10.4)
Chloride: 104 mmol/L (ref 98–110)
Creat: 0.53 mg/dL — ABNORMAL LOW (ref 0.60–0.88)
GFR, Est African American: 98 mL/min/{1.73_m2} (ref >=60)
GFR, Est Non African American: 85 mL/min/{1.73_m2} (ref >=60)
Globulin: 2.6 g/dL (calc) (ref 1.9–3.7)
Glucose, Bld: 114 mg/dL — ABNORMAL HIGH (ref 65–99)
Potassium: 3.7 mmol/L (ref 3.5–5.3)
Sodium: 143 mmol/L (ref 135–146)
Total Bilirubin: 0.4 mg/dL (ref 0.2–1.2)
Total Protein: 5.9 g/dL — ABNORMAL LOW (ref 6.1–8.1)

## 2019-05-07 LAB — IRON, TOTAL/TOTAL IRON BINDING CAP
%SAT: 10 % (calc) — ABNORMAL LOW (ref 16–45)
Iron: 42 ug/dL — ABNORMAL LOW (ref 45–160)
TIBC: 442 mcg/dL (calc) (ref 250–450)

## 2019-05-07 LAB — VITAMIN D 1,25 DIHYDROXY
Vitamin D 1, 25 (OH)2 Total: 45 pg/mL (ref 18–72)
Vitamin D2 1, 25 (OH)2: <8 pg/mL
Vitamin D3 1, 25 (OH)2: 45 pg/mL

## 2019-05-07 LAB — C-REACTIVE PROTEIN: CRP: 50 mg/L — ABNORMAL HIGH (ref <8.0)

## 2019-05-07 LAB — FERRITIN: Ferritin: 19 ng/mL (ref 16–288)

## 2019-05-07 LAB — SEDIMENTATION RATE: Sed Rate: 48 mm/h — ABNORMAL HIGH (ref 0–30)

## 2019-05-07 LAB — VITAMIN B12: Vitamin B-12: 695 pg/mL (ref 200–1100)

## 2019-05-09 ENCOUNTER — Other Ambulatory Visit: Payer: Self-pay | Admitting: Internal Medicine

## 2019-05-09 ENCOUNTER — Other Ambulatory Visit: Payer: Self-pay | Admitting: Family

## 2019-05-09 DIAGNOSIS — M353 Polymyalgia rheumatica: Secondary | ICD-10-CM

## 2019-05-09 NOTE — Telephone Encounter (Deleted)
Please approve if medication is indicated for long term use

## 2019-05-09 NOTE — Telephone Encounter (Signed)
Please review and approve if medication is indicated for long term use

## 2019-05-11 ENCOUNTER — Ambulatory Visit (INDEPENDENT_AMBULATORY_CARE_PROVIDER_SITE_OTHER): Payer: Medicare Other | Admitting: *Deleted

## 2019-05-11 DIAGNOSIS — I472 Ventricular tachycardia: Secondary | ICD-10-CM

## 2019-05-11 DIAGNOSIS — I495 Sick sinus syndrome: Secondary | ICD-10-CM | POA: Diagnosis not present

## 2019-05-11 DIAGNOSIS — I4729 Other ventricular tachycardia: Secondary | ICD-10-CM

## 2019-05-11 LAB — CUP PACEART REMOTE DEVICE CHECK
Battery Impedance: 3802 Ohm
Battery Remaining Longevity: 14 mo
Battery Voltage: 2.71 V
Brady Statistic AP VP Percent: 3 %
Brady Statistic AP VS Percent: 17 %
Brady Statistic AS VP Percent: 0 %
Brady Statistic AS VS Percent: 80 %
Date Time Interrogation Session: 20201030131239
Implantable Lead Implant Date: 20001227
Implantable Lead Implant Date: 20001227
Implantable Lead Location: 753859
Implantable Lead Location: 753860
Implantable Lead Model: 5076
Implantable Lead Model: 5076
Implantable Pulse Generator Implant Date: 20090603
Lead Channel Impedance Value: 415 Ohm
Lead Channel Impedance Value: 566 Ohm
Lead Channel Pacing Threshold Amplitude: 0.5 V
Lead Channel Pacing Threshold Amplitude: 0.625 V
Lead Channel Pacing Threshold Pulse Width: 0.4 ms
Lead Channel Pacing Threshold Pulse Width: 0.4 ms
Lead Channel Setting Pacing Amplitude: 2 V
Lead Channel Setting Pacing Amplitude: 2.5 V
Lead Channel Setting Pacing Pulse Width: 0.64 ms
Lead Channel Setting Sensing Sensitivity: 2.8 mV

## 2019-05-16 ENCOUNTER — Non-Acute Institutional Stay: Payer: Medicare Other | Admitting: Internal Medicine

## 2019-05-16 ENCOUNTER — Other Ambulatory Visit: Payer: Self-pay

## 2019-05-16 ENCOUNTER — Encounter: Payer: Self-pay | Admitting: Internal Medicine

## 2019-05-16 VITALS — BP 106/68 | HR 99 | Temp 98.1°F | Ht 60.0 in | Wt 128.2 lb

## 2019-05-16 DIAGNOSIS — M353 Polymyalgia rheumatica: Secondary | ICD-10-CM

## 2019-05-16 DIAGNOSIS — R634 Abnormal weight loss: Secondary | ICD-10-CM

## 2019-05-16 DIAGNOSIS — R443 Hallucinations, unspecified: Secondary | ICD-10-CM | POA: Diagnosis not present

## 2019-05-16 DIAGNOSIS — D649 Anemia, unspecified: Secondary | ICD-10-CM

## 2019-05-16 NOTE — Progress Notes (Signed)
Location: [place] of Service:  Clinic (12)  Provider:   Code Status:  Goals of Care:  Advanced Directives 01/24/2019  Does Patient Have a Medical Advance Directive? Yes  Type of Advance Directive Out of facility DNR (pink MOST or yellow form);Healthcare Power of Attorney  Does patient want to make changes to medical advance directive? No - Patient declined  Copy of [place] in Chart? -  Would patient like information on creating a medical advance directive? -  Pre-existing out of facility DNR order (yellow form or pink MOST form) Yellow form placed in chart (order not valid for inpatient use);Pink MOST form placed in chart (order not valid for inpatient use)     Chief Complaint  Patient presents with  . Medical Management of Chronic Issues    Patient returns to the clinic for follow up. She is still taking the prednisone, otherwise no concerns today.    HPI: Patient is a 85 y.o. female seen today for an acute visit for Follow up of her PMR Patient has a history ofdysphagia s/p dilatation,  Sick sinus syndrome status post pacemaker. Follows with cardiology Age-related osteoporosis. Only on vitamin D and calcium Urinary incontinence on Premarin Inguinal hernia   PMR She was diagnosed with PMR again Recently when she presented with pain and stiffness.  She was started on 10 mg of prednisone.  She got better but had continuous weakness.  We have decreased the dose to 7.5 mg.Then 5 mg due to Hallucinations Still Having Some Occasional Hallucinations and Per husband she gets confused easily like will forget her way going to apartment She is working with therapy Doing well. No Pain. Just Proximal weakness. Independent in ADLS  Weight Loss with Dysphagia Bartholome Bill gained 8 lbs. Since last visit Her daughter is coming everyday and making sure she is eating Puree Diest She was  dilated by GI in 1/20.  She continues to have issues with dysphagia  Does not want to see  GI yet  Past Medical History:  Diagnosis Date  . Achalasia   . Anemia   . Arthritis    back  . Chronic steroid use   . Complete heart block ([place])    S/P PACEMAKER 2000 W/ GENERATOR CHANGE 2009  . Empyema, right ([place]) PULMOLOGIST-- DR CLANCE   VATS 06/23/2012 cultures negative to date CXR 07/19/12 persistent airfluid levels/  CXR 11-01-2012 IMPROVE RIGHT PLEURAL EFFUSION  . GERD (gastroesophageal reflux disease)   . History of aspiration pneumonitis    DEC 2013  . History of hiatal hernia   . Hypertension   . Inguinal hernia    right  . Intrinsic urethral sphincter deficiency   . Megaesophagus   . Mixed stress and urge urinary incontinence   . Multinodular thyroid 06/26/2012   Multi nodular goiter. Large nodules in both lobes of the gland.  These nodules fit national criteria for fine needle aspiration  biopsy if not previously assessed.    . OSA on CPAP    cpap, doees not know settings  . Polymyalgia rheumatica (HCC)    on chronic Prednisone 31m daily  . RBBB   . S/P dilatation of esophageal stricture     Past Surgical History:  Procedure Laterality Date  . APPENDECTOMY  1953   w/ removal benign kidney tumor   . BALLOON DILATION  07/24/2012   Procedure: BALLOON DILATION;  Surgeon: RInda Castle MD;  [facility];  Service: Endoscopy;  Laterality: N/A;  . BALLOON DILATION N/A 12/03/2015   Procedure: BALLOON DILATION;  Surgeon: Mauri Pole, MD;  Location: [place] [facility];  Service: Endoscopy;  Laterality: N/A;  pnuematic balloon  . BALLOON DILATION N/A 03/16/2017   Procedure: BALLOON DILATION;  Surgeon: Mauri Pole, MD;  Location: [place] END[facility]COPY;  Service: Endoscopy;  Laterality: N/A;  PNUEMATIC BALLOONS  . BOTOX INJECTION  08/07/2012   Procedure: BOTOX INJECTION;  Surgeon: Inda Castle, MD;  [facility];  Service: Endoscopy;  Laterality: N/A;  . BOTOX INJECTION N/A 05/24/2013   Procedure: MACROPLASTIQUE IMPLANT;   Surgeon: Irine Seal, MD;  Location: [facility];  Service: Urology;  Laterality: N/A;  . BOTOX INJECTION  02/25/2014   Procedure: BOTOX INJECTION;  Surgeon: Inda Castle, MD;  [facility];  Service: Endoscopy;;  . CARDIAC PACEMAKER PLACEMENT  06/1999  DR RUTH GREENFIELD AT [place]  ( LAST PACER CHECK 05-09-2013) for CHB/   END-OF-LIFE GENERATOR CHANGE  2009  . CATARACT EXTRACTION W/ INTRAOCULAR LENS  IMPLANT, BILATERAL  2005  . DILATION AND CURETTAGE OF UTERUS    . ESOPHAGOGASTRODUODENOSCOPY  07/24/2012   Procedure: ESOPHAGOGASTRODUODENOSCOPY (EGD);  Surgeon: Inda Castle, MD;  [facility];  Service: Endoscopy;  Laterality: N/A;  . ESOPHAGOGASTRODUODENOSCOPY  08/07/2012   Procedure: ESOPHAGOGASTRODUODENOSCOPY (EGD);  Surgeon: Inda Castle, MD;  Location[facility];  Service: Endoscopy;  Laterality: N/A;  . ESOPHAGOGASTRODUODENOSCOPY N/A 02/25/2014   Procedure: ESOPHAGOGASTRODUODENOSCOPY (EGD);  Surgeon: Inda Castle, MD;  Location[facility];  Service: Endoscopy;  Laterality: N/A;  . ESOPHAGOGASTRODUODENOSCOPY N/A 03/16/2017   Procedure: ESOPHAGOGASTRODUODENOSCOPY (EGD);  Surgeon: Mauri Pole, MD;  Location: [facility] ENDOSCOPY;  Service: Endoscopy;  Laterality: N/A;  . ESOPHAGOGASTRODUODENOSCOPY N/A 08/02/2018   Procedure: ESOPHAGOGASTRODUODENOSCOPY (EGD);  Surgeon: Mauri Pole, MD;  [facility];  Service: Endoscopy;  Laterality: N/A;  . ESOPHAGOGASTRODUODENOSCOPY (EGD) WITH ESOPHAGEAL DILATION  06/27/2012   Procedure: ESOPHAGOGASTRODUODENOSCOPY (EGD) WITH ESOPHAGEAL DILATION;  Surgeon: Ladene Artist, MD,FACG;  Location: [place];  Service: Endoscopy;  Laterality: N/A;  . ESOPHAGOGASTRODUODENOSCOPY (EGD) WITH PROPOFOL N/A 10/09/2015   Procedure: ESOPHAGOGASTRODUODENOSCOPY (EGD) WITH PROPOFOL ( WITH BOTOX);  Surgeon: Milus Banister, MD;  [facility];  Service: Endoscopy;  Laterality: N/A;  .  ESOPHAGOGASTRODUODENOSCOPY (EGD) WITH PROPOFOL N/A 12/03/2015   Procedure: ESOPHAGOGASTRODUODENOSCOPY (EGD) WITH PROPOFOL;  Surgeon: Mauri Pole, MD;  Location: [place] ENDOSCOPY;  Service: Endoscopy;  Laterality: N/A;  . FOREIGN BODY REMOVAL  08/02/2018   Procedure: FOREIGN BODY REMOVAL;  Surgeon: Mauri Pole, MD;  [facility];  Service: Endoscopy;;  . PACEMAKER GENERATOR CHANGE  12/13/2007   at [facility]  . TOE SURGERY  2013   left 3rd toe HAMMERTOE REPAIR  . TONSILLECTOMY  AS CHILD  . TOTAL HIP ARTHROPLASTY Right 2001  . VERICOSE VEIN LIGATION    . VIDEO ASSISTED THORACOSCOPY (VATS)/DECORTICATION  06/23/2012   Procedure: VIDEO ASSISTED THORACOSCOPY (VATS)/DECORTICATION;  Surgeon: Grace Isaac, MD;  Location: [place];  Service: Thoracic;  Laterality: Right;  Marland Kitchen VIDEO ASSISTED THORACOSCOPY (VATS)/EMPYEMA     06/23/2012  . VIDEO BRONCHOSCOPY  06/23/2012   Procedure: VIDEO BRONCHOSCOPY;  Surgeon: Grace Isaac, MD;  Location: [facility] OR;  Service: Thoracic;  Laterality: N/A;    Allergies  Allergen Reactions  . Actonel [Risedronate Sodium] Other (See Comments)    Joint aches; rechallenged --caused joint aches  . Ivp Dye [Iodinated Diagnostic Agents] Nausea And Vomiting  Outpatient Encounter Medications as of 05/16/2019  Medication Sig  . acetaminophen (TYLENOL) 325 MG tablet Take 650 mg by mouth as needed for moderate pain (arthritis pain). Reported on 08/18/2015  . AMBULATORY NON FORMULARY MEDICATION Medication Name: ultra zyme- Take 1 capsule twice daily  . ascorbic acid (VITAMIN C) 500 MG tablet Take 500 mg by mouth daily. Reported on 08/26/2015  . Calcium Carbonate (CALCIUM 600 PO) Take 600 mg by mouth daily.  . Cholecalciferol (VITAMIN D-3) 1000 units CAPS Take 1,000 Units by mouth daily.   . furosemide (LASIX) 20 MG tablet Take 1 tablet (20 mg total) by mouth as needed (swelling). (Patient taking differently: Take 15 mg by mouth as needed (swelling). )  . Omega 3 1200  MG CAPS Take 1,200 mg by mouth at bedtime.   . predniSONE (DELTASONE) 5 MG tablet TAKE 1.5 TABLETS (7.5 MG TOTAL) BY MOUTH DAILY WITH BREAKFAST.   No facility-administered encounter medications on file as of 05/16/2019.     Review of Systems:  Review of Systems  Constitutional: Positive for activity change.  HENT: Negative.   Respiratory: Negative.   Cardiovascular: Negative.   Gastrointestinal: Negative.   Genitourinary: Negative.   Musculoskeletal: Positive for arthralgias.  Neurological: Positive for weakness.  Psychiatric/Behavioral: Positive for hallucinations.    Health Maintenance  Topic Date Due  . TETANUS/TDAP  11/04/2025  . INFLUENZA VACCINE  Completed  . DEXA SCAN  Completed  . PNA vac Low Risk Adult  Completed    Physical Exam: Vitals:   05/16/19 1622  BP: 106/68  Pulse: 99  Temp: 98.1 F (36.7 C)  SpO2: 97%  Weight: 128 lb 3.2 oz (58.2 kg)  Height: 5' (1.524 m)   Body mass index is 25.04 kg/m. Physical Exam  Constitutional: Oriented to person, place, and time. Well-developed and well-nourished.  HENT:  Head: Normocephalic.  Mouth/Throat: Oropharynx is clear and moist.  Eyes: Pupils are equal, round, and reactive to light.  Neck: Neck supple.  Cardiovascular: [place] Tachycardic normal heart sounds.  No murmur heard. Pulmonary/Chest: Effort normal and breath sounds normal. No respiratory distress. No wheezes. She has no rales.  Abdominal: Soft. Bowel sounds are normal. No distension. There is no tenderness. There is no rebound.  Musculoskeletal: No edema.  Lymphadenopathy: none Neurological: Alert and oriented to person, place, and time. Gait was slow but stable Is able to raise her Arms above her shoulder No Pain Skin: Skin is warm and dry.  Psychiatric: Normal mood and affect. Behavior is normal. Thought content normal.    Labs reviewed: Basic Metabolic Panel: Recent Labs    08/10/18 0800 02/26/19 0000 05/03/19 0830  NA 146 144 143  K  4.0 3.9 3.7  CL 108 108 104  CO2 _0 GLUCOSE 111* 117* 114*  BUN 25 28* 24  CREATININE 0.69 0.58* 0.53*  CALCIUM 10.3 9.8 9.6  TSH 0.88 0.62  --    Liver Function Tests: Recent Labs    08/10/18 0800 02/26/19 0000 05/03/19 0830  AST _1 ALT _2 BILITOT 0.4 0.4 0.4  PROT 6.7 6.0* 5.9*   No results for input(s): LIPASE, AMYLASE in the last 8760 hours. No results for input(s): AMMONIA in the last 8760 hours. CBC: Recent Labs    02/26/19 0000 03/20/19 1439 04/09/19 0000 05/03/19 0830  WBC 5.3 6.9 6.0 5.5  NEUTROABS 3,620 5,210 2,868  --   HGB 11.6* 9.8* 9.7* 9.5*  HCT 34.5* 29.4* 30.4* 30.2*  MCV 99.1 98.0 97.7 94.4  PLT 137* 152 170 182   Lipid Panel: Recent Labs    02/26/19 0000  CHOL 129  HDL 39*  LDLCALC 76  TRIG 65  CHOLHDL 3.3   Lab Results  Component Value Date   HGBA1C 5.4 02/26/2019    Procedures since last visit: No results found.  Assessment/Plan Polymyalgia rheumatica (HCC) Will continue Prednisone 5 mg Repeat CRP in few weeks Patient has refused to follow with Rheumatology but has agreed for referal if CRP comes back High Working with therapy and doing well  Weight loss with Dysphagia Has gained weight since her daughter is helping her with Food. Wants to wait before seeing GI  Hallucinations Better but still there All labs are good No signs of infection Still think it is prednisone  Anemia On Iron now repeat Tachycardia Better  Labs/tests ordered:  CBC and CRP and ESR Next appt:  05/30/2019

## 2019-05-17 ENCOUNTER — Other Ambulatory Visit: Payer: Self-pay | Admitting: Internal Medicine

## 2019-05-23 NOTE — Progress Notes (Signed)
Remote pacemaker transmission.   

## 2019-05-30 ENCOUNTER — Encounter: Payer: Self-pay | Admitting: Internal Medicine

## 2019-06-04 ENCOUNTER — Other Ambulatory Visit: Payer: Self-pay

## 2019-06-04 DIAGNOSIS — M353 Polymyalgia rheumatica: Secondary | ICD-10-CM

## 2019-06-05 ENCOUNTER — Other Ambulatory Visit: Payer: Self-pay | Admitting: Family

## 2019-06-05 ENCOUNTER — Other Ambulatory Visit: Payer: Self-pay | Admitting: *Deleted

## 2019-06-05 ENCOUNTER — Other Ambulatory Visit: Payer: Self-pay | Admitting: Internal Medicine

## 2019-06-05 DIAGNOSIS — M353 Polymyalgia rheumatica: Secondary | ICD-10-CM

## 2019-06-05 LAB — CBC WITH DIFFERENTIAL/PLATELET
Absolute Monocytes: 672 cells/uL (ref 200–950)
Basophils Absolute: 51 cells/uL (ref 0–200)
Basophils Relative: 0.7 %
Eosinophils Absolute: 80 cells/uL (ref 15–500)
Eosinophils Relative: 1.1 %
HCT: 32.6 % — ABNORMAL LOW (ref 35.0–45.0)
Hemoglobin: 10.1 g/dL — ABNORMAL LOW (ref 11.7–15.5)
Lymphs Abs: 2489 cells/uL (ref 850–3900)
MCH: 28.3 pg (ref 27.0–33.0)
MCHC: 31 g/dL — ABNORMAL LOW (ref 32.0–36.0)
MCV: 91.3 fL (ref 80.0–100.0)
MPV: 10.2 fL (ref 7.5–12.5)
Monocytes Relative: 9.2 %
Neutro Abs: 4008 cells/uL (ref 1500–7800)
Neutrophils Relative %: 54.9 %
Platelets: 189 10*3/uL (ref 140–400)
RBC: 3.57 10*6/uL — ABNORMAL LOW (ref 3.80–5.10)
RDW: 14.5 % (ref 11.0–15.0)
Total Lymphocyte: 34.1 %
WBC: 7.3 10*3/uL (ref 3.8–10.8)

## 2019-06-05 LAB — C-REACTIVE PROTEIN: CRP: 5.6 mg/L (ref <8.0)

## 2019-06-05 LAB — SEDIMENTATION RATE: Sed Rate: 41 mm/h — ABNORMAL HIGH (ref 0–30)

## 2019-06-05 MED ORDER — PREDNISONE 5 MG PO TABS: ORAL_TABLET | ORAL | 0 refills | Status: DC

## 2019-06-05 NOTE — Telephone Encounter (Signed)
Patient requested refill.  OV notes reviewed.  Rx faxed to pharmacy.

## 2019-06-13 ENCOUNTER — Non-Acute Institutional Stay: Payer: Medicare Other | Admitting: Internal Medicine

## 2019-06-13 ENCOUNTER — Other Ambulatory Visit: Payer: Self-pay

## 2019-06-13 ENCOUNTER — Encounter: Payer: Self-pay | Admitting: Internal Medicine

## 2019-06-13 VITALS — BP 142/84 | HR 91 | Temp 97.8°F | Ht 60.0 in | Wt 133.4 lb

## 2019-06-13 DIAGNOSIS — D649 Anemia, unspecified: Secondary | ICD-10-CM | POA: Diagnosis not present

## 2019-06-13 DIAGNOSIS — M353 Polymyalgia rheumatica: Secondary | ICD-10-CM | POA: Diagnosis not present

## 2019-06-13 DIAGNOSIS — R634 Abnormal weight loss: Secondary | ICD-10-CM

## 2019-06-13 DIAGNOSIS — R443 Hallucinations, unspecified: Secondary | ICD-10-CM

## 2019-06-13 NOTE — Progress Notes (Signed)
Location: [place] of Service:  Clinic (12)  Provider:   Code Status:  Goals of Care:  Advanced Directives 01/24/2019  Does Patient Have a Medical Advance Directive? Yes  Type of Advance Directive Out of facility DNR (pink MOST or yellow form);Healthcare Power of Attorney  Does patient want to make changes to medical advance directive? No - Patient declined  Copy of [place] in Chart? -  Would patient like information on creating a medical advance directive? -  Pre-existing out of facility DNR order (yellow form or pink MOST form) Yellow form placed in chart (order not valid for inpatient use);Pink MOST form placed in chart (order not valid for inpatient use)     Chief Complaint  Patient presents with   Medical Management of Chronic Issues    4 week follow with labs patient would like to discuss polymyalgia     HPI: Patient is a 85 y.o. female seen today for an acute visit for follow-up of her PMR Patient has a history ofdysphagia s/p dilatation,  Sick sinus syndrome status post pacemaker. Follows with cardiology Age-related osteoporosis. Only on vitamin D and calcium Urinary incontinence on Premarin Inguinal hernia  PMR Patient was diagnosed with PMR again when she presented with pain and stiffness with elevated sed rate and CRP.  She was started on 10 mg of prednisone but the dose had to be decreased to 5 mg due to her hallucinations She is doing really well now.  Working with therapy.  Continues to be independent in her ADLs.  Does have some proximal weakness but pain is gone. Weight loss with dysphagia Continues to gain weight Her daughter is pureing her.  And making sure she that she is eating well Unsteady gait with proximal weakness Discussed with therapy and they are working with her twice a week patient is doing very well Anemia Her hemoglobin has improved  Patient did not have any other acute complaints  Past Medical  History:  Diagnosis Date   Achalasia    Anemia    Arthritis    back   Chronic steroid use    Complete heart block ([place])    S/P PACEMAKER 2000 W/ GENERATOR CHANGE 2009   Empyema, right ([place]) PULMOLOGIST-- DR CLANCE   VATS 06/23/2012 cultures negative to date CXR 07/19/12 persistent airfluid levels/  CXR 11-01-2012 IMPROVE RIGHT PLEURAL EFFUSION   GERD (gastroesophageal reflux disease)    History of aspiration pneumonitis    DEC 2013   History of hiatal hernia    Hypertension    Inguinal hernia    right   Intrinsic urethral sphincter deficiency    Megaesophagus    Mixed stress and urge urinary incontinence    Multinodular thyroid 06/26/2012   Multi nodular goiter. Large nodules in both lobes of the gland.  These nodules fit national criteria for fine needle aspiration  biopsy if not previously assessed.     OSA on CPAP    cpap, doees not know settings   Polymyalgia rheumatica ([place])    on chronic Prednisone 66m daily   RBBB    S/P dilatation of esophageal stricture     Past Surgical History:  Procedure Laterality Date   APPENDECTOMY  1953   w/ removal benign kidney tumor    BALLOON DILATION  07/24/2012   Procedure: BALLOON DILATION;  Surgeon: RInda Castle MD;  [facility];  Service: Endoscopy;  Laterality: N/A;   BALLOON DILATION N/A 12/03/2015  Procedure: BALLOON DILATION;  Surgeon: Mauri Pole, MD;  Location: [place] ENDOSCOPY;  Service: Endoscopy;  Laterality: N/A;  pnuematic balloon   BALLOON DILATION N/A 03/16/2017   Procedure: BALLOON DILATION;  Surgeon: Mauri Pole, MD;  Location: [place] [facility]COPY;  Service: Endoscopy;  Laterality: N/A;  PNUEMATIC BALLOONS   BOTOX INJECTION  08/07/2012   Procedure: BOTOX INJECTION;  Surgeon: Inda Castle, MD;  [facility];  Service: Endoscopy;  Laterality: N/A;   BOTOX INJECTION N/A 05/24/2013   Procedure: MACROPLASTIQUE IMPLANT;  Surgeon: Irine Seal, MD;  Location: [facility];  Service: Urology;  Laterality: N/A;   BOTOX INJECTION  02/25/2014   Procedure: BOTOX INJECTION;  Surgeon: Inda Castle, MD;  [facility];  Service: Endoscopy;;   CARDIAC PACEMAKER PLACEMENT  06/1999  DR RUTH GREENFIELD AT [place]  ( LAST PACER CHECK 05-09-2013) for CHB/   END-OF-LIFE GENERATOR CHANGE  2009   CATARACT EXTRACTION W/ INTRAOCULAR LENS  IMPLANT, BILATERAL  2005   DILATION AND CURETTAGE OF UTERUS     ESOPHAGOGASTRODUODENOSCOPY  07/24/2012   Procedure: ESOPHAGOGASTRODUODENOSCOPY (EGD);  Surgeon: Inda Castle, MD;  Location: [facility];  Service: Endoscopy;  Laterality: N/A;   ESOPHAGOGASTRODUODENOSCOPY  08/07/2012   Procedure: ESOPHAGOGASTRODUODENOSCOPY (EGD);  Surgeon: Inda Castle, MD;  Location: [facility];  Service: Endoscopy;  Laterality: N/A;   ESOPHAGOGASTRODUODENOSCOPY N/A 02/25/2014   Procedure: ESOPHAGOGASTRODUODENOSCOPY (EGD);  Surgeon: Inda Castle, MD;  Location: [facility];  Service: Endoscopy;  Laterality: N/A;   ESOPHAGOGASTRODUODENOSCOPY N/A 03/16/2017   Procedure: ESOPHAGOGASTRODUODENOSCOPY (EGD);  Surgeon: Mauri Pole, MD;  Location: [facility];  Service: Endoscopy;  Laterality: N/A;   ESOPHAGOGASTRODUODENOSCOPY N/A 08/02/2018   Procedure: ESOPHAGOGASTRODUODENOSCOPY (EGD);  Surgeon: Mauri Pole, MD;  Location: [facility];  Service: Endoscopy;  Laterality: N/A;   ESOPHAGOGASTRODUODENOSCOPY (EGD) WITH ESOPHAGEAL DILATION  06/27/2012   Procedure: ESOPHAGOGASTRODUODENOSCOPY (EGD) WITH ESOPHAGEAL DILATION;  Surgeon: Ladene Artist, MD,FACG;  Location: [place];  Service: Endoscopy;  Laterality: N/A;   ESOPHAGOGASTRODUODENOSCOPY (EGD) WITH PROPOFOL N/A 10/09/2015   Procedure: ESOPHAGOGASTRODUODENOSCOPY (EGD) WITH PROPOFOL ( WITH BOTOX);  Surgeon: Milus Banister, MD;  Location: [facility];  Service: Endoscopy;  Laterality: N/A;   ESOPHAGOGASTRODUODENOSCOPY (EGD) WITH PROPOFOL N/A  12/03/2015   Procedure: ESOPHAGOGASTRODUODENOSCOPY (EGD) WITH PROPOFOL;  Surgeon: Mauri Pole, MD;  Location: [place] ENDOSCOPY;  Service: Endoscopy;  Laterality: N/A;   FOREIGN BODY REMOVAL  08/02/2018   Procedure: FOREIGN BODY REMOVAL;  Surgeon: Mauri Pole, MD;  [facility];  Service: Endoscopy;;   PACEMAKER GENERATOR CHANGE  12/13/2007   at [place]  2013   left 3rd toe HAMMERTOE REPAIR   TONSILLECTOMY  AS CHILD   TOTAL HIP ARTHROPLASTY Right 2001   VERICOSE VEIN LIGATION     VIDEO ASSISTED THORACOSCOPY (VATS)/DECORTICATION  06/23/2012   Procedure: VIDEO ASSISTED THORACOSCOPY (VATS)/DECORTICATION;  Surgeon: Grace Isaac, MD;  Location: [place];  Service: Thoracic;  Laterality: Right;   [place] (VATS)/EMPYEMA     06/23/2012   VIDEO BRONCHOSCOPY  06/23/2012   Procedure: VIDEO BRONCHOSCOPY;  Surgeon: Grace Isaac, MD;  [facility];  Service: Thoracic;  Laterality: N/A;    Allergies  Allergen Reactions   Actonel [Risedronate Sodium] Other (See Comments)    Joint aches; rechallenged --caused joint aches   Ivp Dye [Iodinated Diagnostic Agents] Nausea And Vomiting    Outpatient Encounter Medications as of 06/13/2019  Medication Sig  acetaminophen (TYLENOL) 325 MG tablet Take 650 mg by mouth as needed for moderate pain (arthritis pain). Reported on 08/18/2015   AMBULATORY NON FORMULARY MEDICATION Medication Name: ultra zyme- Take 1 capsule twice daily   ascorbic acid (VITAMIN C) 500 MG tablet Take 500 mg by mouth daily. Reported on 08/26/2015   Calcium Carbonate (CALCIUM 600 PO) Take 600 mg by mouth daily.   Cholecalciferol (VITAMIN D-3) 1000 units CAPS Take 1,000 Units by mouth daily.    furosemide (LASIX) 20 MG tablet TAKE 1 TABLET (20 MG TOTAL) BY MOUTH AS NEEDED (SWELLING).   Omega 3 1200 MG CAPS Take 1,200 mg by mouth at bedtime.    predniSONE (DELTASONE) 5 MG tablet Take one tablet by mouth once daily.    No facility-administered encounter medications on file as of 06/13/2019.     Review of Systems:  Review of Systems  Review of Systems  Constitutional: Negative for activity change, appetite change, chills, diaphoresis, fatigue and fever.  HENT: Negative for mouth sores, postnasal drip, rhinorrhea, sinus pain and sore throat.   Respiratory: Negative for apnea, cough, chest tightness, shortness of breath and wheezing.   Cardiovascular: Negative for chest pain, palpitations and leg swelling.  Gastrointestinal: Negative for abdominal distention, abdominal pain, constipation, diarrhea, nausea and vomiting.  Genitourinary: Negative for dysuria and frequency.  Musculoskeletal: Negative for arthralgias, joint swelling and myalgias.  Skin: Negative for rash.  Neurological: Negative for dizziness, syncope, weakness, light-headedness and numbness.  Psychiatric/Behavioral: Negative for behavioral problems, confusion and sleep disturbance.     Health Maintenance  Topic Date Due   TETANUS/TDAP  11/04/2025   INFLUENZA VACCINE  Completed   DEXA SCAN  Completed   PNA vac Low Risk Adult  Completed    Physical Exam: Vitals:   06/13/19 1607  BP: (!) 142/84  Pulse: 91  Temp: 97.8 F (36.6 C)  TempSrc: Temporal  SpO2: 99%  Weight: 133 lb 6.4 oz (60.5 kg)  Height: 5' (1.524 m)   Body mass index is 26.05 kg/m. Physical Exam  Constitutional: Oriented to person, place, and time. Well-developed and well-nourished.  HENT:  Head: Normocephalic.  Mouth/Throat: Oropharynx is clear and moist.  Eyes: Pupils are equal, round, and reactive to light.  Neck: Neck supple.  Cardiovascular: Normal rate and normal heart sounds.  No murmur heard. Pulmonary/Chest: Effort normal and breath sounds normal. No respiratory distress. No wheezes. She has no rales.  Abdominal: Soft. Bowel sounds are normal. No distension. There is no tenderness. There is no rebound.  Musculoskeletal: No edema.   Lymphadenopathy: none Neurological: Alert and oriented to person, place, and time.  Some weakness in getting up from her Chair and Exam Table Gait is better and steady. Skin: Skin is warm and dry.  Psychiatric: Normal mood and affect. Behavior is normal. Thought content normal.    Labs reviewed: Basic Metabolic Panel: Recent Labs    08/10/18 0800 02/26/19 0000 05/03/19 0830  NA 146 144 143  K 4.0 3.9 3.7  CL 108 108 104  CO2 '29 29 26  ' GLUCOSE 111* 117* 114*  BUN 25 28* 24  CREATININE 0.69 0.58* 0.53*  CALCIUM 10.3 9.8 9.6  TSH 0.88 0.62  --    Liver Function Tests: Recent Labs    08/10/18 0800 02/26/19 0000 05/03/19 0830  AST '15 13 14  ' ALT '11 7 7  ' BILITOT 0.4 0.4 0.4  PROT 6.7 6.0* 5.9*   No results for input(s): LIPASE, AMYLASE in the last 8760 hours. No results  for input(s): AMMONIA in the last 8760 hours. CBC: Recent Labs    03/20/19 1439 04/09/19 0000 05/03/19 0830 06/04/19 1502  WBC 6.9 6.0 5.5 7.3  NEUTROABS 5,210 2,868  --  4,008  HGB 9.8* 9.7* 9.5* 10.1*  HCT 29.4* 30.4* 30.2* 32.6*  MCV 98.0 97.7 94.4 91.3  PLT 152 170 182 189   Lipid Panel: Recent Labs    02/26/19 0000  CHOL 129  HDL 39*  LDLCALC 76  TRIG 65  CHOLHDL 3.3   Lab Results  Component Value Date   HGBA1C 5.4 02/26/2019    Procedures since last visit: No results found.  Assessment/Plan  Polymyalgia rheumatica (HCC) Will continue Prednisone 5 mg for now Repeat ESR and CRP in 4 weeks before her visit again with me  Weight loss with Dysphagia Is slowly gaining weight now  Hallucinations Are much better but she still sometimes get them especially in the evening Anemia On Iron now Repeat looks stable Tachycardia Mild but improved  Labs/tests ordered:  * No order type specified * Next appt:  07/12/2019

## 2019-06-28 ENCOUNTER — Other Ambulatory Visit: Payer: Self-pay | Admitting: Internal Medicine

## 2019-07-12 ENCOUNTER — Other Ambulatory Visit: Payer: Medicare Other

## 2019-07-12 ENCOUNTER — Other Ambulatory Visit: Payer: Self-pay

## 2019-07-12 DIAGNOSIS — M353 Polymyalgia rheumatica: Secondary | ICD-10-CM

## 2019-07-13 LAB — SEDIMENTATION RATE: Sed Rate: 39 mm/h — ABNORMAL HIGH (ref 0–30)

## 2019-07-13 LAB — C-REACTIVE PROTEIN: CRP: 4.8 mg/L (ref <8.0)

## 2019-07-18 ENCOUNTER — Other Ambulatory Visit: Payer: Self-pay

## 2019-07-18 ENCOUNTER — Encounter: Payer: Self-pay | Admitting: Internal Medicine

## 2019-07-18 ENCOUNTER — Non-Acute Institutional Stay: Payer: Medicare Other | Admitting: Internal Medicine

## 2019-07-18 VITALS — BP 156/84 | HR 95 | Temp 97.9°F | Ht 60.0 in | Wt 130.8 lb

## 2019-07-18 DIAGNOSIS — R634 Abnormal weight loss: Secondary | ICD-10-CM

## 2019-07-18 DIAGNOSIS — R1310 Dysphagia, unspecified: Secondary | ICD-10-CM

## 2019-07-18 DIAGNOSIS — M810 Age-related osteoporosis without current pathological fracture: Secondary | ICD-10-CM

## 2019-07-18 DIAGNOSIS — D649 Anemia, unspecified: Secondary | ICD-10-CM

## 2019-07-18 DIAGNOSIS — R600 Localized edema: Secondary | ICD-10-CM

## 2019-07-18 DIAGNOSIS — M353 Polymyalgia rheumatica: Secondary | ICD-10-CM | POA: Diagnosis not present

## 2019-07-18 DIAGNOSIS — R443 Hallucinations, unspecified: Secondary | ICD-10-CM

## 2019-07-18 DIAGNOSIS — R1319 Other dysphagia: Secondary | ICD-10-CM

## 2019-07-18 MED ORDER — POTASSIUM CHLORIDE CRYS ER 20 MEQ PO TBCR: 20.0000 meq | EXTENDED_RELEASE_TABLET | Freq: Every day | ORAL | 3 refills | Status: DC

## 2019-07-18 MED ORDER — MELOXICAM 7.5 MG PO TABS: 7.5000 mg | ORAL_TABLET | Freq: Every day | ORAL | 0 refills | Status: AC

## 2019-07-18 NOTE — Progress Notes (Signed)
Location: [place] of Service:  Clinic (12)  Provider:   Code Status:  Goals of Care:  Advanced Directives 01/24/2019  Does Patient Have a Medical Advance Directive? Yes  Type of Advance Directive Out of facility DNR (pink MOST or yellow form);Healthcare Power of Attorney  Does patient want to make changes to medical advance directive? No - Patient declined  Copy of [place] in Chart? -  Would patient like information on creating a medical advance directive? -  Pre-existing out of facility DNR order (yellow form or pink MOST form) Yellow form placed in chart (order not valid for inpatient use);Pink MOST form placed in chart (order not valid for inpatient use)     Chief Complaint  Patient presents with  . Medical Management of Chronic Issues    1 month follow up    HPI: Patient is a 86 y.o. female seen today for an acute visit for Follow up for her PMR, Anemia and Dysphagia Patient has a history ofdysphagia s/p dilatation,  Sick sinus syndrome status post pacemaker. Follows with cardiology Age-related osteoporosis. Only on vitamin D and calcium Urinary incontinence on Premarin Inguinal hernia PMR Patient was diagnosed with PMR again when she presented with pain and stiffness with elevated sed rate and CRP.  She was started on 10 mg of prednisone but the dose had to be decreased to 5 mg due to her hallucinations Her Sed rate and CRp has come down and are in Normal Ranges now Therapy has discharged her Walking with walker. No Pain S/P Fall Has Mechanical Fall on Christmas day when she was trying to open the door. She has been having pain in her Lower back and Shoulder blade since then. Dysphagia Continues on Puree Diet Weight loss Has stabilized on Puree diet Anemia Is taking iron  Lives with her husband. Was here today with her son. Her daughter is also helping them stay in [place]   Past Medical History:  Diagnosis Date  .  Achalasia   . Anemia   . Arthritis    back  . Chronic steroid use   . Complete heart block ([place])    S/P PACEMAKER 2000 W/ GENERATOR CHANGE 2009  . Empyema, right ([place]) PULMOLOGIST-- DR CLANCE   VATS 06/23/2012 cultures negative to date CXR 07/19/12 persistent airfluid levels/  CXR 11-01-2012 IMPROVE RIGHT PLEURAL EFFUSION  . GERD (gastroesophageal reflux disease)   . History of aspiration pneumonitis    DEC 2013  . History of hiatal hernia   . Hypertension   . Inguinal hernia    right  . Intrinsic urethral sphincter deficiency   . Megaesophagus   . Mixed stress and urge urinary incontinence   . Multinodular thyroid 06/26/2012   Multi nodular goiter. Large nodules in both lobes of the gland.  These nodules fit national criteria for fine needle aspiration  biopsy if not previously assessed.    . OSA on CPAP    cpap, doees not know settings  . Polymyalgia rheumatica (HCC)    on chronic Prednisone 5m daily  . RBBB   . S/P dilatation of esophageal stricture     Past Surgical History:  Procedure Laterality Date  . APPENDECTOMY  1953   w/ removal benign kidney tumor   . BALLOON DILATION  07/24/2012   Procedure: BALLOON DILATION;  Surgeon: RInda Castle MD;  [facility];  Service: Endoscopy;  Laterality: N/A;  . BALLOON DILATION N/A 12/03/2015  Procedure: BALLOON DILATION;  Surgeon: Mauri Pole, MD;  Location: [place] END[facility]COPY;  Service: Endoscopy;  Laterality: N/A;  pnuematic balloon  . BALLOON DILATION N/A 03/16/2017   Procedure: BALLOON DILATION;  Surgeon: Mauri Pole, MD;  Location: [place] ENDOSCOPY;  Service: Endoscopy;  Laterality: N/A;  PNUEMATIC BALLOONS  . BOTOX INJECTION  08/07/2012   Procedure: BOTOX INJECTION;  Surgeon: Inda Castle, MD;  [facility];  Service: Endoscopy;  Laterality: N/A;  . BOTOX INJECTION N/A 05/24/2013   Procedure: MACROPLASTIQUE IMPLANT;  Surgeon: Irine Seal, MD;  Location: [facility];  Service: Urology;   Laterality: N/A;  . BOTOX INJECTION  02/25/2014   Procedure: BOTOX INJECTION;  Surgeon: Inda Castle, MD;  [facility];  Service: Endoscopy;;  . CARDIAC PACEMAKER PLACEMENT  06/1999  DR RUTH GREENFIELD AT [place]  ( LAST PACER CHECK 05-09-2013) for CHB/   END-OF-LIFE GENERATOR CHANGE  2009  . CATARACT EXTRACTION W/ INTRAOCULAR LENS  IMPLANT, BILATERAL  2005  . DILATION AND CURETTAGE OF UTERUS    . ESOPHAGOGASTRODUODENOSCOPY  07/24/2012   Procedure: ESOPHAGOGASTRODUODENOSCOPY (EGD);  Surgeon: Inda Castle, MD;  Location: [facility];  Service: Endoscopy;  Laterality: N/A;  . ESOPHAGOGASTRODUODENOSCOPY  08/07/2012   Procedure: ESOPHAGOGASTRODUODENOSCOPY (EGD);  Surgeon: Inda Castle, MD;  Location: [facility];  Service: Endoscopy;  Laterality: N/A;  . ESOPHAGOGASTRODUODENOSCOPY N/A 02/25/2014   Procedure: ESOPHAGOGASTRODUODENOSCOPY (EGD);  Surgeon: Inda Castle, MD;  Location: [facility];  Service: Endoscopy;  Laterality: N/A;  . ESOPHAGOGASTRODUODENOSCOPY N/A 03/16/2017   Procedure: ESOPHAGOGASTRODUODENOSCOPY (EGD);  Surgeon: Mauri Pole, MD;  Location: [facility] ENDOSCOPY;  Service: Endoscopy;  Laterality: N/A;  . ESOPHAGOGASTRODUODENOSCOPY N/A 08/02/2018   Procedure: ESOPHAGOGASTRODUODENOSCOPY (EGD);  Surgeon: Mauri Pole, MD;  Location[facility];  Service: Endoscopy;  Laterality: N/A;  . ESOPHAGOGASTRODUODENOSCOPY (EGD) WITH ESOPHAGEAL DILATION  06/27/2012   Procedure: ESOPHAGOGASTRODUODENOSCOPY (EGD) WITH ESOPHAGEAL DILATION;  Surgeon: Ladene Artist, MD,FACG;  Location: [place];  Service: Endoscopy;  Laterality: N/A;  . ESOPHAGOGASTRODUODENOSCOPY (EGD) WITH PROPOFOL N/A 10/09/2015   Procedure: ESOPHAGOGASTRODUODENOSCOPY (EGD) WITH PROPOFOL ( WITH BOTOX);  Surgeon: Milus Banister, MD;  Location: [facility];  Service: Endoscopy;  Laterality: N/A;  . ESOPHAGOGASTRODUODENOSCOPY (EGD) WITH PROPOFOL N/A 12/03/2015   Procedure:  ESOPHAGOGASTRODUODENOSCOPY (EGD) WITH PROPOFOL;  Surgeon: Mauri Pole, MD;  Location: [place] [facility];  Service: Endoscopy;  Laterality: N/A;  . FOREIGN BODY REMOVAL  08/02/2018   Procedure: FOREIGN BODY REMOVAL;  Surgeon: Mauri Pole, MD;  [facility];  Service: Endoscopy;;  . PACEMAKER GENERATOR CHANGE  12/13/2007   at [facility]  . TOE SURGERY  2013   left 3rd toe HAMMERTOE REPAIR  . TONSILLECTOMY  AS CHILD  . TOTAL HIP ARTHROPLASTY Right 2001  . VERICOSE VEIN LIGATION    . VIDEO ASSISTED THORACOSCOPY (VATS)/DECORTICATION  06/23/2012   Procedure: VIDEO ASSISTED THORACOSCOPY (VATS)/DECORTICATION;  Surgeon: Grace Isaac, MD;  Location: [place];  Service: Thoracic;  Laterality: Right;  [place] VIDEO ASSISTED THORACOSCOPY (VATS)/EMPYEMA     06/23/2012  . VIDEO BRONCHOSCOPY  06/23/2012   Procedure: VIDEO BRONCHOSCOPY;  Surgeon: Grace Isaac, MD;  [facility]: [facility];  Service: Thoracic;  Laterality: N/A;    Allergies  Allergen Reactions  . Actonel [Risedronate Sodium] Other (See Comments)    Joint aches; rechallenged --caused joint aches  . Ivp Dye [Iodinated Diagnostic Agents] Nausea And Vomiting    Outpatient Encounter Medications as of 07/18/2019  Medication Sig  .  acetaminophen (TYLENOL) 325 MG tablet Take 650 mg by mouth as needed for moderate pain (arthritis pain). Reported on 08/18/2015  . AMBULATORY NON FORMULARY MEDICATION Medication Name: ultra zyme- Take 1 capsule twice daily  . ascorbic acid (VITAMIN C) 500 MG tablet Take 500 mg by mouth daily. Reported on 08/26/2015  . Calcium Carbonate (CALCIUM 600 PO) Take 600 mg by mouth daily.  . Cholecalciferol (VITAMIN D-3) 1000 units CAPS Take 1,000 Units by mouth daily.   . furosemide (LASIX) 20 MG tablet TAKE 1 TABLET (20 MG TOTAL) BY MOUTH AS NEEDED (SWELLING).  . Omega 3 1200 MG CAPS Take 1,200 mg by mouth at bedtime.   . predniSONE (DELTASONE) 5 MG tablet TAKE 1 TABLET BY MOUTH EVERY DAY   No facility-administered  encounter medications on file as of 07/18/2019.    Review of Systems:  Review of Systems  Constitutional: Positive for activity change.  HENT: Negative.   Respiratory: Negative.   Cardiovascular: Positive for leg swelling.  Gastrointestinal: Negative.   Genitourinary: Negative.   Musculoskeletal: Positive for back pain and myalgias.  Skin: Negative.   Neurological: Negative.   Psychiatric/Behavioral: Negative.     Health Maintenance  Topic Date Due  . TETANUS/TDAP  11/04/2025  . INFLUENZA VACCINE  Completed  . DEXA SCAN  Completed  . PNA vac Low Risk Adult  Completed    Physical Exam: There were no vitals filed for this visit. There is no height or weight on file to calculate BMI. Physical Exam Vitals reviewed.  Constitutional:      Appearance: Normal appearance.  HENT:     Head: Normocephalic.     Nose: Nose normal.     Mouth/Throat:     Mouth: Mucous membranes are moist.     Pharynx: Oropharynx is clear.  Eyes:     Pupils: Pupils are equal, round, and reactive to light.  Cardiovascular:     Rate and Rhythm: Normal rate and regular rhythm.     Pulses: Normal pulses.  Pulmonary:     Effort: Pulmonary effort is normal. No respiratory distress.     Breath sounds: Normal breath sounds. No wheezing or rales.  Abdominal:     General: Abdomen is flat. Bowel sounds are normal.     Palpations: Abdomen is soft.  Musculoskeletal:        General: Swelling present.     Cervical back: Neck supple.  Skin:    General: Skin is warm.  Neurological:     General: No focal deficit present.     Mental Status: She is alert and oriented to person, place, and time.  Psychiatric:        Mood and Affect: Mood normal.        Thought Content: Thought content normal.     Labs reviewed: Basic Metabolic Panel: Recent Labs    08/10/18 0800 02/26/19 0000 05/03/19 0830  NA 146 144 143  K 4.0 3.9 3.7  CL 108 108 104  CO2 _0 GLUCOSE 111* 117* 114*  BUN 25 28* 24    CREATININE 0.69 0.58* 0.53*  CALCIUM 10.3 9.8 9.6  TSH 0.88 0.62  --    Liver Function Tests: Recent Labs    08/10/18 0800 02/26/19 0000 05/03/19 0830  AST _1 ALT _2 BILITOT 0.4 0.4 0.4  PROT 6.7 6.0* 5.9*   No results for input(s): LIPASE, AMYLASE in the last 8760 hours. No results for input(s): AMMONIA in the  last 8760 hours. CBC: Recent Labs    03/20/19 1439 04/09/19 0000 05/03/19 0830 06/04/19 1502  WBC 6.9 6.0 5.5 7.3  NEUTROABS 5,210 2,868  --  4,008  HGB 9.8* 9.7* 9.5* 10.1*  HCT 29.4* 30.4* 30.2* 32.6*  MCV 98.0 97.7 94.4 91.3  PLT 152 170 182 189   Lipid Panel: Recent Labs    02/26/19 0000  CHOL 129  HDL 39*  LDLCALC 76  TRIG 65  CHOLHDL 3.3   Lab Results  Component Value Date   HGBA1C 5.4 02/26/2019    Procedures since last visit: No results found.  Assessment/Plan  Polymyalgia rheumatica (HCC) Reduce Prednisone to 2.5 mg MWF Continue 72m Rest of the days Repeat ESR and CRp in 4 weeks Anemia, unspecified type On Iron Does not want to see Gi at this time Repeat CBC  Bilateral leg edema Taking Lasix QD now Will also Start Potassium 20 meq QD Repeat Bmp in 4 weeks Back and Shoulder blade Pain Does not Look Fracture but she is at higher risk especially with her Fall Will try Mobic for 1 week. If not better consider Xray Weight loss Has stabilized  Hallucinations Related to Prednisone Decraseing the dose will help  Esophageal dysphagia Follows with GI On Puree Diet Told he to Crush Potassium Tablet  Age-related osteoporosis without current pathological fracture T Score in 2018 was -2.9 Plan was to start Prolia Never Started  Will need Office visit for this as cannot do it in the Clinic anymore  OSA Continues on CPAP S/P Pacemaker Follows with Cardiology  Labs/tests ordered:  * No order type specified * Next appt:  Visit date not found

## 2019-07-24 ENCOUNTER — Other Ambulatory Visit: Payer: Self-pay | Admitting: Internal Medicine

## 2019-08-05 ENCOUNTER — Telehealth: Payer: Self-pay | Admitting: Nurse Practitioner

## 2019-08-05 NOTE — Telephone Encounter (Signed)
Daughter called on 08/04/19 while on call stating that her mother was given a short course of mobic due to increase in pain and was on and requesting a refill. Due to pts hx and medications advised to use tylenol 1000 mg by mouth every 8 hours as needed for pain. She would like a follow up due to the ongoing pain. Per last note possible xray as well? I was unsure if she needed to call and make an appt or if you would advise her. Please follow up with daughter and let her know what the next steps are.  Thanks

## 2019-08-10 ENCOUNTER — Ambulatory Visit (INDEPENDENT_AMBULATORY_CARE_PROVIDER_SITE_OTHER): Payer: Medicare Other | Admitting: *Deleted

## 2019-08-10 DIAGNOSIS — I495 Sick sinus syndrome: Secondary | ICD-10-CM | POA: Diagnosis not present

## 2019-08-12 LAB — CUP PACEART REMOTE DEVICE CHECK
Battery Impedance: 4061 Ohm
Battery Remaining Longevity: 13 mo
Battery Voltage: 2.71 V
Brady Statistic AP VP Percent: 3 %
Brady Statistic AP VS Percent: 15 %
Brady Statistic AS VP Percent: 0 %
Brady Statistic AS VS Percent: 82 %
Date Time Interrogation Session: 20210130093125
Implantable Lead Implant Date: 20001227
Implantable Lead Implant Date: 20001227
Implantable Lead Location: 753859
Implantable Lead Location: 753860
Implantable Lead Model: 5076
Implantable Lead Model: 5076
Implantable Pulse Generator Implant Date: 20090603
Lead Channel Impedance Value: 421 Ohm
Lead Channel Impedance Value: 532 Ohm
Lead Channel Pacing Threshold Amplitude: 0.625 V
Lead Channel Pacing Threshold Amplitude: 0.625 V
Lead Channel Pacing Threshold Pulse Width: 0.4 ms
Lead Channel Pacing Threshold Pulse Width: 0.4 ms
Lead Channel Setting Pacing Amplitude: 2 V
Lead Channel Setting Pacing Amplitude: 2.5 V
Lead Channel Setting Pacing Pulse Width: 0.4 ms
Lead Channel Setting Sensing Sensitivity: 2.8 mV

## 2019-08-13 ENCOUNTER — Other Ambulatory Visit: Payer: Medicare Other

## 2019-08-13 ENCOUNTER — Other Ambulatory Visit: Payer: Self-pay

## 2019-08-13 DIAGNOSIS — M353 Polymyalgia rheumatica: Secondary | ICD-10-CM

## 2019-08-14 LAB — CBC WITH DIFFERENTIAL/PLATELET
Absolute Monocytes: 644 cells/uL (ref 200–950)
Basophils Absolute: 52 cells/uL (ref 0–200)
Basophils Relative: 0.7 %
Eosinophils Absolute: 104 cells/uL (ref 15–500)
Eosinophils Relative: 1.4 %
HCT: 29.8 % — ABNORMAL LOW (ref 35.0–45.0)
Hemoglobin: 9.7 g/dL — ABNORMAL LOW (ref 11.7–15.5)
Lymphs Abs: 2220 cells/uL (ref 850–3900)
MCH: 31.5 pg (ref 27.0–33.0)
MCHC: 32.6 g/dL (ref 32.0–36.0)
MCV: 96.8 fL (ref 80.0–100.0)
MPV: 10.5 fL (ref 7.5–12.5)
Monocytes Relative: 8.7 %
Neutro Abs: 4381 cells/uL (ref 1500–7800)
Neutrophils Relative %: 59.2 %
Platelets: 183 10*3/uL (ref 140–400)
RBC: 3.08 10*6/uL — ABNORMAL LOW (ref 3.80–5.10)
RDW: 17 % — ABNORMAL HIGH (ref 11.0–15.0)
Total Lymphocyte: 30 %
WBC: 7.4 10*3/uL (ref 3.8–10.8)

## 2019-08-14 LAB — COMPLETE METABOLIC PANEL WITH GFR
AG Ratio: 1.3 (calc) (ref 1.0–2.5)
ALT: 6 U/L (ref 6–29)
AST: 12 U/L (ref 10–35)
Albumin: 3.6 g/dL (ref 3.6–5.1)
Alkaline phosphatase (APISO): 84 U/L (ref 37–153)
BUN/Creatinine Ratio: 44 (calc) — ABNORMAL HIGH (ref 6–22)
BUN: 26 mg/dL — ABNORMAL HIGH (ref 7–25)
CO2: 28 mmol/L (ref 20–32)
Calcium: 9.9 mg/dL (ref 8.6–10.4)
Chloride: 105 mmol/L (ref 98–110)
Creat: 0.59 mg/dL — ABNORMAL LOW (ref 0.60–0.88)
GFR, Est African American: 95 mL/min/{1.73_m2} (ref >=60)
GFR, Est Non African American: 82 mL/min/{1.73_m2} (ref >=60)
Globulin: 2.8 g/dL (calc) (ref 1.9–3.7)
Glucose, Bld: 118 mg/dL — ABNORMAL HIGH (ref 65–99)
Potassium: 4.1 mmol/L (ref 3.5–5.3)
Sodium: 142 mmol/L (ref 135–146)
Total Bilirubin: 0.4 mg/dL (ref 0.2–1.2)
Total Protein: 6.4 g/dL (ref 6.1–8.1)

## 2019-08-14 LAB — C-REACTIVE PROTEIN: CRP: 10.6 mg/L — ABNORMAL HIGH (ref <8.0)

## 2019-08-14 LAB — SEDIMENTATION RATE: Sed Rate: 46 mm/h — ABNORMAL HIGH (ref 0–30)

## 2019-08-15 ENCOUNTER — Non-Acute Institutional Stay: Payer: Medicare Other | Admitting: Internal Medicine

## 2019-08-15 ENCOUNTER — Other Ambulatory Visit: Payer: Self-pay

## 2019-08-15 ENCOUNTER — Encounter: Payer: Self-pay | Admitting: Internal Medicine

## 2019-08-15 VITALS — BP 100/70 | HR 101 | Temp 97.7°F | Ht 60.0 in | Wt 130.8 lb

## 2019-08-15 DIAGNOSIS — W19XXXD Unspecified fall, subsequent encounter: Secondary | ICD-10-CM | POA: Diagnosis not present

## 2019-08-15 DIAGNOSIS — R1319 Other dysphagia: Secondary | ICD-10-CM

## 2019-08-15 DIAGNOSIS — M546 Pain in thoracic spine: Secondary | ICD-10-CM

## 2019-08-15 DIAGNOSIS — M353 Polymyalgia rheumatica: Secondary | ICD-10-CM

## 2019-08-15 DIAGNOSIS — R634 Abnormal weight loss: Secondary | ICD-10-CM

## 2019-08-15 DIAGNOSIS — R1310 Dysphagia, unspecified: Secondary | ICD-10-CM

## 2019-08-15 DIAGNOSIS — R600 Localized edema: Secondary | ICD-10-CM

## 2019-08-15 DIAGNOSIS — M898X1 Other specified disorders of bone, shoulder: Secondary | ICD-10-CM | POA: Diagnosis not present

## 2019-08-15 DIAGNOSIS — D649 Anemia, unspecified: Secondary | ICD-10-CM

## 2019-08-15 NOTE — Progress Notes (Signed)
Location: [place] of Service:  Clinic (12)  Provider:   Code Status:  Goals of Care:  Advanced Directives 01/24/2019  Does Patient Have a Medical Advance Directive? Yes  Type of Advance Directive Out of facility DNR (pink MOST or yellow form);Healthcare Power of Attorney  Does patient want to make changes to medical advance directive? No - Patient declined  Copy of [place] in Chart? -  Would patient like information on creating a medical advance directive? -  Pre-existing out of facility DNR order (yellow form or pink MOST form) Yellow form placed in chart (order not valid for inpatient use);Pink MOST form placed in chart (order not valid for inpatient use)     Chief Complaint  Patient presents with  . Medical Management of Chronic Issues    1 month follow up. Patient complains of some back pain. She has had both covid vaccines and tolerated well.     HPI: Patient is a 86 y.o. female seen today for an acute visit for Follow up  Her active issues  C/O Pain in the Back since her fall Meloxicam helped but pain still there PMR I had reduced her dose to 2m Alternate with 2.5 mg But patient now has more weakness ESR and CRP elevated Weight Loss Her weight is down 10 lbs  She says she is not eating well. No Appetite and Per family Dysphagia is bothering her also Hallucinations Continue to have them. No Change with reducing the Prednisone Bilateral Edema Better with Lasix but now BP Low and feels dizzy sometimes  Lives with her Husband Walking with walker Done with therapy. No More Falls recently   Past Medical History:  Diagnosis Date  . Achalasia   . Anemia   . Arthritis    back  . Chronic steroid use   . Complete heart block (HDent    S/P PACEMAKER 2000 W/ GENERATOR CHANGE 2009  . Empyema, right ([facility]-- DR CLANCE   VATS 06/23/2012 cultures negative to date CXR 07/19/12 persistent airfluid levels/  CXR 11-01-2012  IMPROVE RIGHT PLEURAL EFFUSION  . GERD (gastroesophageal reflux disease)   . History of aspiration pneumonitis    DEC 2013  . History of hiatal hernia   . Hypertension   . Inguinal hernia    right  . Intrinsic urethral sphincter deficiency   . Megaesophagus   . Mixed stress and urge urinary incontinence   . Multinodular thyroid 06/26/2012   Multi nodular goiter. Large nodules in both lobes of the gland.  These nodules fit national criteria for fine needle aspiration  biopsy if not previously assessed.    . OSA on CPAP    cpap, doees not know settings  . Polymyalgia rheumatica (HCC)    on chronic Prednisone 582mdaily  . RBBB   . S/P dilatation of esophageal stricture     Past Surgical History:  Procedure Laterality Date  . APPENDECTOMY  1953   w/ removal benign kidney tumor   . BALLOON DILATION  07/24/2012   Procedure: BALLOON DILATION;  Surgeon: RoInda CastleMD;  [facility];  Service: Endoscopy;  Laterality: N/A;  . BALLOON DILATION N/A 12/03/2015   Procedure: BALLOON DILATION;  Surgeon: KaMauri PoleMD;  [facility];  Service: Endoscopy;  Laterality: N/A;  pnuematic balloon  . BALLOON DILATION N/A 03/16/2017   Procedure: BALLOON DILATION;  Surgeon: NaMauri PoleMD;  [facility];  Service: Endoscopy;  Laterality: N/A;  PNUEMATIC BALLOONS  . BOTOX INJECTION  08/07/2012   Procedure: BOTOX INJECTION;  Surgeon: Inda Castle, MD;  [facility];  Service: Endoscopy;  Laterality: N/A;  . BOTOX INJECTION N/A 05/24/2013   Procedure: MACROPLASTIQUE IMPLANT;  Surgeon: Irine Seal, MD;  Location[facility];  Service: Urology;  Laterality: N/A;  . BOTOX INJECTION  02/25/2014   Procedure: BOTOX INJECTION;  Surgeon: Inda Castle, MD;  [facility]: [facility];  Service: Endoscopy;;  . CARDIAC PACEMAKER PLACEMENT  06/1999  DR RUTH GREENFIELD AT [place]  ( LAST PACER CHECK 05-09-2013) for CHB/   END-OF-LIFE  GENERATOR CHANGE  2009  . CATARACT EXTRACTION W/ INTRAOCULAR LENS  IMPLANT, BILATERAL  2005  . DILATION AND CURETTAGE OF UTERUS    . ESOPHAGOGASTRODUODENOSCOPY  07/24/2012   Procedure: ESOPHAGOGASTRODUODENOSCOPY (EGD);  Surgeon: Inda Castle, MD;  [facility];  Service: Endoscopy;  Laterality: N/A;  . ESOPHAGOGASTRODUODENOSCOPY  08/07/2012   Procedure: ESOPHAGOGASTRODUODENOSCOPY (EGD);  Surgeon: Inda Castle, MD;  [facility];  Service: Endoscopy;  Laterality: N/A;  . ESOPHAGOGASTRODUODENOSCOPY N/A 02/25/2014   Procedure: ESOPHAGOGASTRODUODENOSCOPY (EGD);  Surgeon: Inda Castle, MD;  [facility];  Service: Endoscopy;  Laterality: N/A;  . ESOPHAGOGASTRODUODENOSCOPY N/A 03/16/2017   Procedure: ESOPHAGOGASTRODUODENOSCOPY (EGD);  Surgeon: Mauri Pole, MD;  Location[facility];  Service: Endoscopy;  Laterality: N/A;  . ESOPHAGOGASTRODUODENOSCOPY N/A 08/02/2018   Procedure: ESOPHAGOGASTRODUODENOSCOPY (EGD);  Surgeon: Mauri Pole, MD;  [facility];  Service: Endoscopy;  Laterality: N/A;  . ESOPHAGOGASTRODUODENOSCOPY (EGD) WITH ESOPHAGEAL DILATION  06/27/2012   Procedure: ESOPHAGOGASTRODUODENOSCOPY (EGD) WITH ESOPHAGEAL DILATION;  Surgeon: Ladene Artist, MD,FACG;  Location: [place];  Service: Endoscopy;  Laterality: N/A;  . ESOPHAGOGASTRODUODENOSCOPY (EGD) WITH PROPOFOL N/A 10/09/2015   Procedure: ESOPHAGOGASTRODUODENOSCOPY (EGD) WITH PROPOFOL ( WITH BOTOX);  Surgeon: Milus Banister, MD;  [facility];  Service: Endoscopy;  Laterality: N/A;  . ESOPHAGOGASTRODUODENOSCOPY (EGD) WITH PROPOFOL N/A 12/03/2015   Procedure: ESOPHAGOGASTRODUODENOSCOPY (EGD) WITH PROPOFOL;  Surgeon: Mauri Pole, MD;  Location: [place] [facility];  Service: Endoscopy;  Laterality: N/A;  . FOREIGN BODY REMOVAL  08/02/2018   Procedure: FOREIGN BODY REMOVAL;  Surgeon: Mauri Pole, MD;  [facility];  Service: Endoscopy;;  . PACEMAKER  GENERATOR CHANGE  12/13/2007   at [facility]  . TOE SURGERY  2013   left 3rd toe HAMMERTOE REPAIR  . TONSILLECTOMY  AS CHILD  . TOTAL HIP ARTHROPLASTY Right 2001  . VERICOSE VEIN LIGATION    . VIDEO ASSISTED THORACOSCOPY (VATS)/DECORTICATION  06/23/2012   Procedure: VIDEO ASSISTED THORACOSCOPY (VATS)/DECORTICATION;  Surgeon: Grace Isaac, MD;  Location: [place];  Service: Thoracic;  Laterality: Right;  Marland Kitchen VIDEO ASSISTED THORACOSCOPY (VATS)/EMPYEMA     06/23/2012  . VIDEO BRONCHOSCOPY  06/23/2012   Procedure: VIDEO BRONCHOSCOPY;  Surgeon: Grace Isaac, MD;  Location: [facility] OR;  Service: Thoracic;  Laterality: N/A;    Allergies  Allergen Reactions  . Actonel [Risedronate Sodium] Other (See Comments)    Joint aches; rechallenged --caused joint aches  . Ivp Dye [Iodinated Diagnostic Agents] Nausea And Vomiting    Outpatient Encounter Medications as of 08/15/2019  Medication Sig  . acetaminophen (TYLENOL) 500 MG tablet Take 1,000 mg by mouth. Once or twice a day as needed  . AMBULATORY NON FORMULARY MEDICATION Medication Name: ultra zyme- Take 1 capsule twice daily  . ascorbic acid (VITAMIN C) 500 MG tablet Take 500 mg by mouth  daily. Reported on 08/26/2015  . Calcium-Phosphorus-Vitamin D (CALCIUM/VITAMIN D3/ADULT GUMMY PO) Take by mouth. 500 mg calcium, 1000 iu vitamin D3, Phosphorous 253m. 2 daily  . Cholecalciferol 125 MCG (5000 UT) TABS Take by mouth daily.  . Collagen-Boron-Hyaluronic Acid ([employer] JOINT HEALTH TRIPLE ACTION PO) Take 1 tablet by mouth daily.  . Digestive Enzymes (ENZYME DIGEST PO) Take by mouth. 1 each morning  . furosemide (LASIX) 20 MG tablet TAKE 1 TABLET (20 MG TOTAL) BY MOUTH AS NEEDED (SWELLING).  . Multiple Vitamins-Minerals (HM MULTIVITAMIN ADULT GUMMY PO) Take by mouth. 2 daily  . Multiple Vitamins-Minerals (OCUVITE EYE + MULTI PO) Take by mouth daily.  . potassium chloride SA (KLOR-CON) 20 MEQ tablet Take 1 tablet (20 mEq total) by mouth daily.  . predniSONE  (DELTASONE) 5 MG tablet TAKE 1 TABLET BY MOUTH EVERY DAY  . zinc gluconate 50 MG tablet Take 50 mg by mouth daily.   No facility-administered encounter medications on file as of 08/15/2019.    Review of Systems:  Review of Systems  Constitutional: Positive for activity change, appetite change and unexpected weight change.  HENT: Negative.   Respiratory: Negative.   Cardiovascular: Positive for leg swelling.  Gastrointestinal: Negative.   Genitourinary: Negative.   Musculoskeletal: Positive for back pain.  Skin: Negative.   Neurological: Positive for weakness.  Psychiatric/Behavioral: Positive for hallucinations.    Health Maintenance  Topic Date Due  . TETANUS/TDAP  11/04/2025  . INFLUENZA VACCINE  Completed  . DEXA SCAN  Completed  . PNA vac Low Risk Adult  Completed    Physical Exam: Vitals:   08/15/19 1358  BP: 100/70  Pulse: (!) 101  Temp: 97.7 F (36.5 C)  SpO2: 98%  Weight: 130 lb 12.8 oz (59.3 kg)  Height: 5' (1.524 m)   Body mass index is 25.55 kg/m. Physical Exam Vitals reviewed.  Constitutional:      Appearance: Normal appearance.  HENT:     Head: Normocephalic.     Nose: Nose normal.     Mouth/Throat:     Mouth: Mucous membranes are moist.     Pharynx: Oropharynx is clear.  Eyes:     Pupils: Pupils are equal, round, and reactive to light.  Cardiovascular:     Rate and Rhythm: Normal rate and regular rhythm.     Pulses: Normal pulses.  Pulmonary:     Effort: Pulmonary effort is normal. No respiratory distress.     Breath sounds: Normal breath sounds. No wheezing or rales.  Abdominal:     General: Abdomen is flat. Bowel sounds are normal.     Palpations: Abdomen is soft.  Musculoskeletal:     Cervical back: Neck supple.     Comments: Mild Swelling Bilateral  Has Pain around her Right Lower scapula and Thoracic spina  Skin:    General: Skin is warm.  Neurological:     General: No focal deficit present.     Mental Status: She is alert and  oriented to person, place, and time.     Comments: Good Proximal Strength Able to stand with no assist  Psychiatric:        Mood and Affect: Mood normal.        Thought Content: Thought content normal.     Labs reviewed: Basic Metabolic Panel: Recent Labs    02/26/19 0000 05/03/19 0830 08/10/19 0934  NA 144 143 142  K 3.9 3.7 4.1  CL 108 104 105  CO2 _0 GLUCOSE 117*  114* 118*  BUN 28* 24 26*  CREATININE 0.58* 0.53* 0.59*  CALCIUM 9.8 9.6 9.9  TSH 0.62  --   --    Liver Function Tests: Recent Labs    02/26/19 0000 05/03/19 0830 08/10/19 0934  AST _0 ALT _1 BILITOT 0.4 0.4 0.4  PROT 6.0* 5.9* 6.4   No results for input(s): LIPASE, AMYLASE in the last 8760 hours. No results for input(s): AMMONIA in the last 8760 hours. CBC: Recent Labs    04/09/19 0000 04/09/19 0000 05/03/19 0830 06/04/19 1502 08/10/19 0934  WBC 6.0   < > 5.5 7.3 7.4  NEUTROABS 2,868  --   --  4,008 4,381  HGB 9.7*   < > 9.5* 10.1* 9.7*  HCT 30.4*   < > 30.2* 32.6* 29.8*  MCV 97.7   < > 94.4 91.3 96.8  PLT 170   < > 182 189 183   < > = values in this interval not displayed.   Lipid Panel: Recent Labs    02/26/19 0000  CHOL 129  HDL 39*  LDLCALC 76  TRIG 65  CHOLHDL 3.3   Lab Results  Component Value Date   HGBA1C 5.4 02/26/2019    Procedures since last visit: [place]  Result Date: 08/12/2019 Scheduled remote reviewed.  Normal device function.  3 HVR episodes, longest 5 seconds. Available marker channel/EGMs appear to be atrial drivne.   One AHR episode lasting 3 minutes; available EGM appears to be ST/AT. Next remote 91 days.  [place]   Assessment/Plan Fall, subsequent encounter - Plan: DG Scapula Right No Recent Falls Doing well with walker  Shoulder blade pain - Plan: DG Scapula Right Tylenol PRN for Pain  Acute right-sided thoracic back pain - Plan: DG Thoracic Spine 2 View  Polymyalgia rheumatica (HCC) CRP and ESR  increased again. She was c/o More weakness Will go back to Prednisone 5 mg QD D/W Family again for Rheumatology Consult Will discuss next visit  Weight loss - Plan: Ambulatory referral to Gastroenterology Has finally agreed to go and see GI Continues to have Dysphagia Loosing weight and Not eating as she does not like her Puree diet  Anemia, unspecified type Hgb Stable On Iron  Bilateral leg edema With her BP low and Mild tachycardia Decrease her Lasix to 20 mg 3/week  Esophageal dysphagia - Plan: Ambulatory referral to Gastroenterology On Puree Diet Hallucinations Related to Prednisone Decraseing the dose will help   Age-related osteoporosis without current pathological fracture T Score in 2018 was -2.9 Plan was to start Prolia Never Started  Will need Office visit for this as cannot do it in the Clinic anymore  OSA Continues on CPAP S/P Pacemaker Follows with Cardiology   Labs/tests ordered:  * No order type specified * Next appt:  09/12/2019

## 2019-08-21 ENCOUNTER — Other Ambulatory Visit: Payer: Self-pay | Admitting: Internal Medicine

## 2019-08-21 ENCOUNTER — Other Ambulatory Visit: Payer: Self-pay

## 2019-08-21 ENCOUNTER — Telehealth: Payer: Self-pay | Admitting: *Deleted

## 2019-08-21 ENCOUNTER — Ambulatory Visit
Admission: RE | Admit: 2019-08-21 | Discharge: 2019-08-21 | Disposition: A | Discharge status: Home / Self Care | Payer: Medicare Other | Source: Ambulatory Visit | Attending: Internal Medicine | Admitting: Internal Medicine

## 2019-08-21 DIAGNOSIS — M898X1 Other specified disorders of bone, shoulder: Secondary | ICD-10-CM

## 2019-08-21 DIAGNOSIS — M545 Low back pain, unspecified: Secondary | ICD-10-CM

## 2019-08-21 DIAGNOSIS — M546 Pain in thoracic spine: Secondary | ICD-10-CM

## 2019-08-21 DIAGNOSIS — W19XXXD Unspecified fall, subsequent encounter: Secondary | ICD-10-CM

## 2019-08-21 NOTE — Telephone Encounter (Signed)
Pt is at [facility] asking to add a order to her current order. Pt's daughter state pt fell on christmas day and now complaining of pain in her tailbone. Pt was seen on 08/15/2019 and no mention of tailbone pain. Please advise on order?

## 2019-08-21 NOTE — Telephone Encounter (Signed)
Order entered by Dr. Lyndel Safe

## 2019-08-22 ENCOUNTER — Encounter: Payer: Self-pay | Admitting: Internal Medicine

## 2019-08-27 ENCOUNTER — Telehealth: Payer: Self-pay

## 2019-08-27 NOTE — Telephone Encounter (Signed)
Patient daughter "[name]" called and left a voicemail stating that X-Ray results were discussed with mother "[name]". However mother doesn't remember anything that was discussed so labs were recited again with daughter. Patient daughter states that she doesn't believe her mom is able to have an MRI with a Total Right Hip Replacement, and Pace Maker. Please Advise.

## 2019-08-29 NOTE — Telephone Encounter (Signed)
She has Appointment to see me in 2 weeks I will d/w her again about her Xrays

## 2019-09-07 ENCOUNTER — Other Ambulatory Visit: Payer: Self-pay | Admitting: Internal Medicine

## 2019-09-10 ENCOUNTER — Other Ambulatory Visit: Payer: Self-pay

## 2019-09-10 ENCOUNTER — Encounter: Payer: Self-pay | Admitting: Gastroenterology

## 2019-09-10 ENCOUNTER — Ambulatory Visit (INDEPENDENT_AMBULATORY_CARE_PROVIDER_SITE_OTHER): Payer: Medicare Other | Admitting: Gastroenterology

## 2019-09-10 VITALS — BP 90/58 | HR 76 | Temp 98.3°F | Ht 60.0 in | Wt 119.2 lb

## 2019-09-10 DIAGNOSIS — R634 Abnormal weight loss: Secondary | ICD-10-CM

## 2019-09-10 DIAGNOSIS — K220 Achalasia of cardia: Secondary | ICD-10-CM | POA: Diagnosis not present

## 2019-09-10 DIAGNOSIS — K225 Diverticulum of esophagus, acquired: Secondary | ICD-10-CM

## 2019-09-10 DIAGNOSIS — R1310 Dysphagia, unspecified: Secondary | ICD-10-CM

## 2019-09-10 DIAGNOSIS — Q396 Congenital diverticulum of esophagus: Secondary | ICD-10-CM

## 2019-09-10 DIAGNOSIS — R1319 Other dysphagia: Secondary | ICD-10-CM

## 2019-09-10 NOTE — Patient Instructions (Addendum)
You have been scheduled for a Barium Esophogram at [facility] (1st floor of the hospital) on _3/9/2021____ at __9:30am___. Please arrive 15 minutes prior to your appointment for registration. Make certain not to have anything to eat or drink 3 hours prior to your test. If you need to reschedule for any reason, please contact radiology at [phone] to do so. __________________________________________________________________ A barium swallow is an examination that concentrates on views of the esophagus. This tends to be a double contrast exam (barium and two liquids which, when combined, create a gas to distend the wall of the oesophagus) or single contrast (non-ionic iodine based). The study is usually tailored to your symptoms so a good history is essential. Attention is paid during the study to the form, structure and configuration of the esophagus, looking for functional disorders (such as aspiration, dysphagia, achalasia, motility and reflux) EXAMINATION You may be asked to change into a gown, depending on the type of swallow being performed. A radiologist and radiographer will perform the procedure. The radiologist will advise you of the type of contrast selected for your procedure and direct you during the exam. You will be asked to stand, sit or lie in several different positions and to hold a small amount of fluid in your mouth before being asked to swallow while the imaging is performed .In some instances you may be asked to swallow barium coated marshmallows to assess the motility of a solid food bolus. The exam can be recorded as a digital or video fluoroscopy procedure. POST PROCEDURE It will take 1-2 days for the barium to pass through your system. To facilitate this, it is important, unless otherwise directed, to increase your fluids for the next 24-48hrs and to resume your normal diet.  This test typically takes about 30 minutes to perform.  I appreciate the  opportunity to  care for you  Thank You   Harl Bowie , MD  __________________________________________________________________________________

## 2019-09-10 NOTE — Progress Notes (Signed)
[name]    OP:[id number]    [birth year]  Primary Care Physician:Gupta, Rene Kocher, MD  Referring Physician: [name], MD [place],  [place] [id number]   Chief complaint:  Achalasia, dysphagia  HPI:  86 year old female here for follow-up visit for achalasia accompanied by her daughter. Botox injection March 2017, pneumatic balloon dilation with 30 mm Rigiflex balloon in May 2017 and subsequently with 35 mm Rigiflex pneumatic balloon dilation  September 2018  EGD 08/02/18: Retained food with esophageal diverticula and dilated tortuous esophagus. GE junction was wide open, pneumatic dilation was not performed  Continues to have intermittent dysphagia and gurgling in her throat with aspiration. She does okay on most days but certain times she feels things do not go down very well. According to her daughter she is not compliant with dietary recommendations and eats pretty much what she prefers.  She is progressively losing weight   Outpatient Encounter Medications as of 09/10/2019  Medication Sig  . acetaminophen (TYLENOL) 500 MG tablet Take 1,000 mg by mouth. Once or twice a day as needed  . AMBULATORY NON FORMULARY MEDICATION Medication Name: ultra zyme- Take 1 capsule twice daily  . Calcium-Phosphorus-Vitamin D (CALCIUM/VITAMIN D3/ADULT GUMMY PO) Take by mouth. 500 mg calcium, 1000 iu vitamin D3, Phosphorous 230mg . 2 daily  . Collagen-Boron-Hyaluronic Acid ([employer] JOINT HEALTH TRIPLE ACTION PO) Take 1 tablet by mouth daily.  . furosemide (LASIX) 20 MG tablet TAKE 1 TABLET (20 MG TOTAL) BY MOUTH AS NEEDED (SWELLING). (Patient taking differently: Take 20 mg by mouth 3 (three) times a week. )  . Multiple Vitamins-Minerals (HM MULTIVITAMIN ADULT GUMMY PO) Take by mouth. 2 daily  . Multiple Vitamins-Minerals (OCUVITE EYE + MULTI PO) Take by mouth daily.  . potassium chloride SA (KLOR-CON) 20 MEQ tablet Take 1 tablet (20 mEq total) by mouth daily. (Patient  taking differently: Take 20 mEq by mouth 3 (three) times a week. With Lasix)  . predniSONE (DELTASONE) 5 MG tablet TAKE 1 TABLET BY MOUTH EVERY DAY  . [DISCONTINUED] ascorbic acid (VITAMIN C) 500 MG tablet Take 500 mg by mouth daily. Reported on 08/26/2015  . [DISCONTINUED] Cholecalciferol 125 MCG (5000 UT) TABS Take by mouth daily.  . [DISCONTINUED] Digestive Enzymes (ENZYME DIGEST PO) Take by mouth. 1 each morning  . [DISCONTINUED] zinc gluconate 50 MG tablet Take 50 mg by mouth daily.   No facility-administered encounter medications on file as of 09/10/2019.    Allergies as of 09/10/2019 - Review Complete 09/10/2019  Allergen Reaction Noted  . Actonel [risedronate sodium] Other (See Comments) 06/19/2012  . Ivp dye [iodinated diagnostic agents] Nausea And Vomiting 06/19/2012    Past Medical History:  Diagnosis Date  . Achalasia   . Anemia   . Arthritis    back  . Chronic steroid use   . Complete heart block ([place])    S/P PACEMAKER 2000 W/ GENERATOR CHANGE 2009  . Empyema, right ([place]) PULMOLOGIST-- DR CLANCE   VATS 06/23/2012 cultures negative to date CXR 07/19/12 persistent airfluid levels/  CXR 11-01-2012 IMPROVE RIGHT PLEURAL EFFUSION  . GERD (gastroesophageal reflux disease)   . History of aspiration pneumonitis    DEC 2013  . History of hiatal hernia   . Hypertension   . Inguinal hernia    right  . Intrinsic urethral sphincter deficiency   . Megaesophagus   . Mixed stress and urge urinary incontinence   . Multinodular thyroid 06/26/2012  Multi nodular goiter. Large nodules in both lobes of the gland.  These nodules fit national criteria for fine needle aspiration  biopsy if not previously assessed.    . OSA on CPAP    cpap, doees not know settings  . Polymyalgia rheumatica (HCC)    on chronic Prednisone 5mg  daily  . RBBB   . S/P dilatation of esophageal stricture     Past Surgical History:  Procedure Laterality Date  . APPENDECTOMY  1953   w/ removal benign kidney  tumor   . BALLOON DILATION  07/24/2012   Procedure: BALLOON DILATION;  Surgeon: Inda Castle, MD;  Location: [facility];  Service: Endoscopy;  Laterality: N/A;  . BALLOON DILATION N/A 12/03/2015   Procedure: BALLOON DILATION;  Surgeon: Mauri Pole, MD;  Location: [facility];  Service: Endoscopy;  Laterality: N/A;  pnuematic balloon  . BALLOON DILATION N/A 03/16/2017   Procedure: BALLOON DILATION;  Surgeon: Mauri Pole, MD;  Location: [facility];  Service: Endoscopy;  Laterality: N/A;  PNUEMATIC BALLOONS  . BOTOX INJECTION  08/07/2012   Procedure: BOTOX INJECTION;  Surgeon: Inda Castle, MD;  Location: [facility];  Service: Endoscopy;  Laterality: N/A;  . BOTOX INJECTION N/A 05/24/2013   Procedure: MACROPLASTIQUE IMPLANT;  Surgeon: Irine Seal, MD;  Location: [facility];  Service: Urology;  Laterality: N/A;  . BOTOX INJECTION  02/25/2014   Procedure: BOTOX INJECTION;  Surgeon: Inda Castle, MD;  Location: [facility];  Service: Endoscopy;;  . CARDIAC PACEMAKER PLACEMENT  06/1999  DR RUTH GREENFIELD AT [facility]  ( LAST PACER CHECK 05-09-2013) for CHB/   END-OF-LIFE GENERATOR CHANGE  2009  . CATARACT EXTRACTION W/ INTRAOCULAR LENS  IMPLANT, BILATERAL  2005  . DILATION AND CURETTAGE OF UTERUS    . ESOPHAGOGASTRODUODENOSCOPY  07/24/2012   Procedure: ESOPHAGOGASTRODUODENOSCOPY (EGD);  Surgeon: Inda Castle, MD;  Location: [facility];  Service: Endoscopy;  Laterality: N/A;  . ESOPHAGOGASTRODUODENOSCOPY  08/07/2012   Procedure: ESOPHAGOGASTRODUODENOSCOPY (EGD);  Surgeon: Inda Castle, MD;  Location: [facility];  Service: Endoscopy;  Laterality: N/A;  . ESOPHAGOGASTRODUODENOSCOPY N/A 02/25/2014   Procedure: ESOPHAGOGASTRODUODENOSCOPY (EGD);  Surgeon: Inda Castle, MD;  Location: [facility];  Service: Endoscopy;  Laterality: N/A;  . ESOPHAGOGASTRODUODENOSCOPY N/A 03/16/2017   Procedure: ESOPHAGOGASTRODUODENOSCOPY (EGD);  Surgeon: Mauri Pole, MD;  Location[facility];  Service: Endoscopy;  Laterality: N/A;  . ESOPHAGOGASTRODUODENOSCOPY N/A 08/02/2018   Procedure: ESOPHAGOGASTRODUODENOSCOPY (EGD);  Surgeon: Mauri Pole, MD;  Location: [facility];  Service: Endoscopy;  Laterality: N/A;  . ESOPHAGOGASTRODUODENOSCOPY (EGD) WITH ESOPHAGEAL DILATION  06/27/2012   Procedure: ESOPHAGOGASTRODUODENOSCOPY (EGD) WITH ESOPHAGEAL DILATION;  Surgeon: Ladene Artist, MD,FACG;  Location: [facility];  Service: Endoscopy;  Laterality: N/A;  . ESOPHAGOGASTRODUODENOSCOPY (EGD) WITH PROPOFOL N/A 10/09/2015   Procedure: ESOPHAGOGASTRODUODENOSCOPY (EGD) WITH PROPOFOL ( WITH BOTOX);  Surgeon: Milus Banister, MD;  Location: [facility];  Service: Endoscopy;  Laterality: N/A;  . ESOPHAGOGASTRODUODENOSCOPY (EGD) WITH PROPOFOL N/A 12/03/2015   Procedure: ESOPHAGOGASTRODUODENOSCOPY (EGD) WITH PROPOFOL;  Surgeon: Mauri Pole, MD;  Location: [facility];  Service: Endoscopy;  Laterality: N/A;  . FOREIGN BODY REMOVAL  08/02/2018   Procedure: FOREIGN BODY REMOVAL;  Surgeon: Mauri Pole, MD;  Location: [facility];  Service: Endoscopy;;  . PACEMAKER GENERATOR CHANGE  12/13/2007   at [facility]  . TOE SURGERY  2013   left 3rd toe HAMMERTOE REPAIR  . TONSILLECTOMY  AS CHILD  . TOTAL HIP ARTHROPLASTY  Right 2001  . VERICOSE VEIN LIGATION    . VIDEO ASSISTED THORACOSCOPY (VATS)/DECORTICATION  06/23/2012   Procedure: VIDEO ASSISTED THORACOSCOPY (VATS)/DECORTICATION;  Surgeon: Grace Isaac, MD;  Location: [facility];  Service: Thoracic;  Laterality: Right;  Marland Kitchen VIDEO ASSISTED THORACOSCOPY (VATS)/EMPYEMA     06/23/2012  . VIDEO BRONCHOSCOPY  06/23/2012   Procedure: VIDEO BRONCHOSCOPY;  Surgeon: Grace Isaac, MD;  Location: [facility] OR;  Service: Thoracic;  Laterality: N/A;    Family History  Problem Relation Age of Onset  . Arthritis Father   . Heart disease Father   . Cancer Brother        ?spine  . Heart disease Brother   . Lung  cancer Brother   . Diabetes Son   . Heart disease Mother        Congestive heart failure  . Heart disease Brother   . Arthritis Brother   . Hypertension Brother   . Colon cancer Neg Hx     Social History   Socioeconomic History  . Marital status: Married    Spouse name: Not on file  . Number of children: 67  . Years of education: Not on file  . Highest education level: Not on file  Occupational History  . Occupation: Retired Surveyor, quantity  Tobacco Use  . Smoking status: Passive Smoke Exposure - Never Smoker  . Smokeless tobacco: Never Used  . Tobacco comment: Exposure through father only.  Substance and Sexual Activity  . Alcohol use: Yes    Alcohol/week: 3.0 standard drinks    Types: 3 Glasses of wine per week    Comment: 1 drink x 3 days/week  . Drug use: No  . Sexual activity: Never    Comment: married  Other Topics Concern  . Not on file  Social History Narrative   Lives at [facility] since 05/18/2012   Married   Pacemaker   POA   Never smoked   Alcohol-wine 2-3 nights a week    Exercise - exercise classes 3 days a week    [employer] [facility] at [facility]      [facility]:   Originally from [place]. Has lived in [place], [place], [place], & moved to [place] in 1961. 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 No bird exposure.    Social Determinants of Health   Financial Resource Strain:   . Difficulty of Paying Living Expenses: Not on file  Food Insecurity:   . Worried About Charity fundraiser in the Last Year: Not on file  . Ran Out of Food in the Last Year: Not on file  Transportation Needs:   . Lack of Transportation (Medical): Not on file  . Lack of Transportation (Non-Medical): Not on file  Physical Activity:   . Days of Exercise per Week: Not on file  . Minutes of Exercise per Session: Not on file  Stress:   . Feeling of Stress : Not on file  Social Connections:   . Frequency of  Communication with Friends and Family: Not on file  . Frequency of Social Gatherings with Friends and Family: Not on file  . Attends Religious Services: Not on file  . Active Member of Clubs or Organizations: Not on file  . Attends Archivist Meetings: Not on file  . Marital Status: Not on file  Intimate Partner Violence:   . Fear of Current or Ex-Partner: Not on file  . Emotionally Abused: Not on  file  . Physically Abused: Not on file  . Sexually Abused: Not on file      Review of systems: All other review of systems negative except as mentioned in the HPI.   Physical Exam: Vitals:   09/10/19 1042  Temp: 98.3 F (36.8 C)   Body mass index is 23.29 kg/m. Gen:      No acute distress Neuro: alert and oriented x 3 Psych: normal mood and affect  Data Reviewed:  Reviewed labs, radiology imaging, old records and pertinent past GI work up   Assessment and Plan/Recommendations:  86 year old female with history of achalasia s/p Botox injection, Rigiflex pneumatic balloon dilation with 20 mm and 35 mm here for follow-up visit with dysphagia, regurgitation and progressive weight loss  She has chronic dysphagia secondary to significantly dilated tortuous esophagus with esophageal diverticula , has fluid and food retention due to multiple esophageal diverticula even though EG junction is patent and open s/p pneumatic dilation as noted on most recent EGD last year  Advised patient to try to be more compliant with dietary changes, pured soft diet  Take sips of water with meals to help clear the esophagus Continue antireflux measures  Patient is reluctant to undergo repeat EGD to evaluate esophagus for any changes in her disease process or to exclude neoplastic lesion. We will proceed with barium esophagram instead.  Return in 6 months or sooner if needed  This visit required 45 minutes of patient care (this includes precharting, chart review, review of results,  face-to-face time used for counseling as well as treatment plan and follow-up. The patient was provided an opportunity to ask questions and all were answered. The patient agreed with the plan and demonstrated an understanding of the instructions.  Damaris Hippo , MD    CC: [name], MD

## 2019-09-12 ENCOUNTER — Non-Acute Institutional Stay: Payer: Medicare Other | Admitting: Internal Medicine

## 2019-09-12 ENCOUNTER — Encounter: Payer: Self-pay | Admitting: Gastroenterology

## 2019-09-12 ENCOUNTER — Encounter: Payer: Self-pay | Admitting: Internal Medicine

## 2019-09-12 ENCOUNTER — Other Ambulatory Visit: Payer: Self-pay

## 2019-09-12 VITALS — BP 122/68 | HR 98 | Temp 97.8°F | Ht 60.0 in | Wt 122.2 lb

## 2019-09-12 DIAGNOSIS — M353 Polymyalgia rheumatica: Secondary | ICD-10-CM | POA: Diagnosis not present

## 2019-09-12 DIAGNOSIS — M545 Low back pain, unspecified: Secondary | ICD-10-CM

## 2019-09-12 DIAGNOSIS — R1319 Other dysphagia: Secondary | ICD-10-CM

## 2019-09-12 DIAGNOSIS — R1310 Dysphagia, unspecified: Secondary | ICD-10-CM

## 2019-09-12 DIAGNOSIS — D649 Anemia, unspecified: Secondary | ICD-10-CM

## 2019-09-12 DIAGNOSIS — R600 Localized edema: Secondary | ICD-10-CM

## 2019-09-12 DIAGNOSIS — R634 Abnormal weight loss: Secondary | ICD-10-CM

## 2019-09-12 DIAGNOSIS — M810 Age-related osteoporosis without current pathological fracture: Secondary | ICD-10-CM

## 2019-09-12 DIAGNOSIS — R443 Hallucinations, unspecified: Secondary | ICD-10-CM

## 2019-09-12 NOTE — Progress Notes (Signed)
Location: [place] of Service:  Clinic (12)  Provider:   Code Status:  Goals of Care:  Advanced Directives 01/24/2019  Does Patient Have a Medical Advance Directive? Yes  Type of Advance Directive Out of facility DNR (pink MOST or yellow form);Healthcare Power of Attorney  Does patient want to make changes to medical advance directive? No - Patient declined  Copy of [place] in Chart? -  Would patient like information on creating a medical advance directive? -  Pre-existing out of facility DNR order (yellow form or pink MOST form) Yellow form placed in chart (order not valid for inpatient use);Pink MOST form placed in chart (order not valid for inpatient use)     Chief Complaint  Patient presents with  . Medical Management of Chronic Issues    4 week follow up. Patient would like to discuss [place] moving [place]. Her legs are still retainin fluid.     HPI: Patient is a 86 y.o. female seen today for an acute visit for Follow up of her PMR, Weight loss and LE edma. Also want to talk about [place] move [place]  Achalasia Continues to have issues with her Swallowing. Daughter really worried. Is seeing GI plan for Barium Swallow.Has lost more weight though she says she is pushing herself to eat. PMR Doing well with her Prednisone. Walking with walker. Weakness is better Though per her  family did have one fall 3 weeks ago  Visual Hallucinations Started since being on Prednisone Continues to have issues with Hallucinations. Not better on Lower dose of Prednisone LE edema Worse sine her dose of Lasix was reduced to 3/week Upper Thoracic pain S/P Fall Compression Fracture in thoracic region Pain Control on Tylenol   Patient also wants to know how Transition to Assisted Living will work for her   Past Medical History:  Diagnosis Date  . Achalasia   . Anemia   . Arthritis    back  . Chronic steroid use   . Complete heart block ([place])    S/P  PACEMAKER 2000 W/ GENERATOR CHANGE 2009  . Empyema, right ([place]) PULMOLOGIST-- DR CLANCE   VATS 06/23/2012 cultures negative to date CXR 07/19/12 persistent airfluid levels/  CXR 11-01-2012 IMPROVE RIGHT PLEURAL EFFUSION  . GERD (gastroesophageal reflux disease)   . History of aspiration pneumonitis    DEC 2013  . History of hiatal hernia   . Hypertension   . Inguinal hernia    right  . Intrinsic urethral sphincter deficiency   . Megaesophagus   . Mixed stress and urge urinary incontinence   . Multinodular thyroid 06/26/2012   Multi nodular goiter. Large nodules in both lobes of the gland.  These nodules fit national criteria for fine needle aspiration  biopsy if not previously assessed.    . OSA on CPAP    cpap, doees not know settings  . Polymyalgia rheumatica (HCC)    on chronic Prednisone 5mg  daily  . RBBB   . S/P dilatation of esophageal stricture     Past Surgical History:  Procedure Laterality Date  . APPENDECTOMY  1953   w/ removal benign kidney tumor   . BALLOON DILATION  07/24/2012   Procedure: BALLOON DILATION;  Surgeon: Inda Castle, MD;  Location: [facility];  Service: Endoscopy;  Laterality: N/A;  . BALLOON DILATION N/A 12/03/2015   Procedure: BALLOON DILATION;  Surgeon: Mauri Pole, MD;  Location: [facility];  Service: Endoscopy;  Laterality:  N/A;  pnuematic balloon  . BALLOON DILATION N/A 03/16/2017   Procedure: BALLOON DILATION;  Surgeon: Mauri Pole, MD;  Location: [facility];  Service: Endoscopy;  Laterality: N/A;  PNUEMATIC BALLOONS  . BOTOX INJECTION  08/07/2012   Procedure: BOTOX INJECTION;  Surgeon: Inda Castle, MD;  Location: [facility];  Service: Endoscopy;  Laterality: N/A;  . BOTOX INJECTION N/A 05/24/2013   Procedure: MACROPLASTIQUE IMPLANT;  Surgeon: Irine Seal, MD;  Location: [facility];  Service: Urology;  Laterality: N/A;  . BOTOX INJECTION  02/25/2014   Procedure: BOTOX INJECTION;  Surgeon: Inda Castle,  MD;  Location: [facility];  Service: Endoscopy;;  . CARDIAC PACEMAKER PLACEMENT  06/1999  DR RUTH GREENFIELD AT [place] [facility]  ( LAST PACER CHECK 05-09-2013) for CHB/   END-OF-LIFE GENERATOR CHANGE  2009  . CATARACT EXTRACTION W/ INTRAOCULAR LENS  IMPLANT, BILATERAL  2005  . DILATION AND CURETTAGE OF UTERUS    . ESOPHAGOGASTRODUODENOSCOPY  07/24/2012   Procedure: ESOPHAGOGASTRODUODENOSCOPY (EGD);  Surgeon: Inda Castle, MD;  Location: [facility];  Service: Endoscopy;  Laterality: N/A;  . ESOPHAGOGASTRODUODENOSCOPY  08/07/2012   Procedure: ESOPHAGOGASTRODUODENOSCOPY (EGD);  Surgeon: Inda Castle, MD;  Location: [facility];  Service: Endoscopy;  Laterality: N/A;  . ESOPHAGOGASTRODUODENOSCOPY N/A 02/25/2014   Procedure: ESOPHAGOGASTRODUODENOSCOPY (EGD);  Surgeon: Inda Castle, MD;  Location: [facility];  Service: Endoscopy;  Laterality: N/A;  . ESOPHAGOGASTRODUODENOSCOPY N/A 03/16/2017   Procedure: ESOPHAGOGASTRODUODENOSCOPY (EGD);  Surgeon: Mauri Pole, MD;  Location: [facility] ENDOSCOPY;  Service: Endoscopy;  Laterality: N/A;  . ESOPHAGOGASTRODUODENOSCOPY N/A 08/02/2018   Procedure: ESOPHAGOGASTRODUODENOSCOPY (EGD);  Surgeon: Mauri Pole, MD;  Location: [facility];  Service: Endoscopy;  Laterality: N/A;  . ESOPHAGOGASTRODUODENOSCOPY (EGD) WITH ESOPHAGEAL DILATION  06/27/2012   Procedure: ESOPHAGOGASTRODUODENOSCOPY (EGD) WITH ESOPHAGEAL DILATION;  Surgeon: Ladene Artist, MD,FACG;  Location: [place];  Service: Endoscopy;  Laterality: N/A;  . ESOPHAGOGASTRODUODENOSCOPY (EGD) WITH PROPOFOL N/A 10/09/2015   Procedure: ESOPHAGOGASTRODUODENOSCOPY (EGD) WITH PROPOFOL ( WITH BOTOX);  Surgeon: Milus Banister, MD;  Location: [facility];  Service: Endoscopy;  Laterality: N/A;  . ESOPHAGOGASTRODUODENOSCOPY (EGD) WITH PROPOFOL N/A 12/03/2015   Procedure: ESOPHAGOGASTRODUODENOSCOPY (EGD) WITH PROPOFOL;  Surgeon: Mauri Pole, MD;  Location: [place];  Service:  Endoscopy;  Laterality: N/A;  . FOREIGN BODY REMOVAL  08/02/2018   Procedure: FOREIGN BODY REMOVAL;  Surgeon: Mauri Pole, MD;  Location: [facility];  Service: Endoscopy;;  . PACEMAKER GENERATOR CHANGE  12/13/2007   at [facility]  . TOE SURGERY  2013   left 3rd toe HAMMERTOE REPAIR  . TONSILLECTOMY  AS CHILD  . TOTAL HIP ARTHROPLASTY Right 2001  . VERICOSE VEIN LIGATION    . VIDEO ASSISTED THORACOSCOPY (VATS)/DECORTICATION  06/23/2012   Procedure: VIDEO ASSISTED THORACOSCOPY (VATS)/DECORTICATION;  Surgeon: Grace Isaac, MD;  Location: [place];  Service: Thoracic;  Laterality: Right;  Marland Kitchen VIDEO ASSISTED THORACOSCOPY (VATS)/EMPYEMA     06/23/2012  . VIDEO BRONCHOSCOPY  06/23/2012   Procedure: VIDEO BRONCHOSCOPY;  Surgeon: Grace Isaac, MD;  [facility];  Service: Thoracic;  Laterality: N/A;    Allergies  Allergen Reactions  . Actonel [Risedronate Sodium] Other (See Comments)    Joint aches; rechallenged --caused joint aches  . Ivp Dye [Iodinated Diagnostic Agents] Nausea And Vomiting    Outpatient Encounter Medications as of 09/12/2019  Medication Sig  . acetaminophen (TYLENOL) 500 MG tablet Take 1,000 mg by mouth. Once or twice a day as needed  .  AMBULATORY NON FORMULARY MEDICATION Medication Name: ultra zyme- Take 1 capsule twice daily  . calcium carbonate (TUMS EX) 750 MG chewable tablet Chew 1 tablet by mouth as needed for heartburn.  . Calcium-Phosphorus-Vitamin D (CALCIUM/VITAMIN D3/ADULT GUMMY PO) Take by mouth. 500 mg calcium, 1000 iu vitamin D3, Phosphorous 230mg . 3 daily  . Collagen-Boron-Hyaluronic Acid ([employer] JOINT HEALTH TRIPLE ACTION PO) Take 1 tablet by mouth daily.  . famotidine (PEPCID) 20 MG tablet Take 20 mg by mouth as needed for heartburn or indigestion.  . furosemide (LASIX) 20 MG tablet TAKE 1 TABLET (20 MG TOTAL) BY MOUTH AS NEEDED (SWELLING). (Patient taking differently: Take 20 mg by mouth 3 (three) times a week. )  . Multiple Vitamins-Minerals (HM  MULTIVITAMIN ADULT GUMMY PO) Take by mouth. 3 daily  . Multiple Vitamins-Minerals (OCUVITE EYE + MULTI PO) Take by mouth daily.  . potassium chloride SA (KLOR-CON) 20 MEQ tablet Take 1 tablet (20 mEq total) by mouth daily. (Patient taking differently: Take 20 mEq by mouth 3 (three) times a week. With Lasix)  . predniSONE (DELTASONE) 5 MG tablet TAKE 1 TABLET BY MOUTH EVERY DAY   No facility-administered encounter medications on file as of 09/12/2019.    Review of Systems:  Review of Systems  Constitutional: Positive for activity change, appetite change and unexpected weight change.  HENT: Negative.   Respiratory: Negative.   Cardiovascular: Positive for leg swelling.  Gastrointestinal: Negative.   Genitourinary: Negative.   Musculoskeletal: Positive for arthralgias, back pain and gait problem.  Neurological: Positive for weakness.  Psychiatric/Behavioral: Positive for hallucinations.      Health Maintenance  Topic Date Due  . TETANUS/TDAP  11/04/2025  . INFLUENZA VACCINE  Completed  . DEXA SCAN  Completed  . PNA vac Low Risk Adult  Completed    Physical Exam: Vitals:   09/12/19 1512  BP: 122/68  Pulse: 98  Temp: 97.8 F (36.6 C)  SpO2: 96%  Weight: 122 lb 3.2 oz (55.4 kg)  Height: 5' (1.524 m)   Body mass index is 23.87 kg/m. Physical Exam  Constitutional: Oriented to person, place, and time. Well-developed and well-nourished.  HENT:  Head: Normocephalic.  Mouth/Throat: Oropharynx is clear and moist.  Eyes: Pupils are equal, round, and reactive to light.  Neck: Neck supple.  Cardiovascular: Normal rate and normal heart sounds.  Positive for Murmur Pulmonary/Chest: Effort normal and breath sounds normal. No respiratory distress. No wheezes. She has no rales.  Abdominal: Soft. Bowel sounds are normal. No distension. There is no tenderness. There is no rebound.  Musculoskeletal: Moderate Edema  Lymphadenopathy: none Neurological: Alert and oriented to person, place,  and time. Good Strength. Able to get up and Walk with her walker Skin: Skin is warm and dry.  Psychiatric: Normal mood and affect. Behavior is normal. Thought content normal.    Labs reviewed: Basic Metabolic Panel: Recent Labs    02/26/19 0000 05/03/19 0830 08/10/19 0934  NA 144 143 142  K 3.9 3.7 4.1  CL 108 104 105  CO2 29 26 28   GLUCOSE 117* 114* 118*  BUN 28* 24 26*  CREATININE 0.58* 0.53* 0.59*  CALCIUM 9.8 9.6 9.9  TSH 0.62  --   --    Liver Function Tests: Recent Labs    02/26/19 0000 05/03/19 0830 08/10/19 0934  AST 13 14 12   ALT 7 7 6   BILITOT 0.4 0.4 0.4  PROT 6.0* 5.9* 6.4   No results for input(s): LIPASE, AMYLASE in the last 8760 hours.  No results for input(s): AMMONIA in the last 8760 hours. CBC: Recent Labs    04/09/19 0000 04/09/19 0000 05/03/19 0830 06/04/19 1502 08/10/19 0934  WBC 6.0   < > 5.5 7.3 7.4  NEUTROABS 2,868  --   --  4,008 4,381  HGB 9.7*   < > 9.5* 10.1* 9.7*  HCT 30.4*   < > 30.2* 32.6* 29.8*  MCV 97.7   < > 94.4 91.3 96.8  PLT 170   < > 182 189 183   < > = values in this interval not displayed.   Lipid Panel: Recent Labs    02/26/19 0000  CHOL 129  HDL 39*  LDLCALC 76  TRIG 65  CHOLHDL 3.3   Lab Results  Component Value Date   HGBA1C 5.4 02/26/2019    Procedures since last visit: DG Thoracic Spine W/Swimmers  Result Date: 08/21/2019 CLINICAL DATA:  Back pain after fall. EXAM: THORACIC SPINE - 3 VIEWS COMPARISON:  None. FINDINGS: Moderate compression deformity of upper thoracic vertebral body is noted concerning for fracture of indeterminate age. No other fracture or spondylolisthesis is noted. Moderate thoracic kyphosis is noted. Mild multilevel degenerative disc disease is noted in lower thoracic spine. Also noted is air-fluid level anterior to the spine of uncertain etiology. Air-fluid level is also noted more superiorly in the mediastinum. Potentially this may represent dilated esophagus. IMPRESSION: Moderate  compression deformity of upper thoracic vertebral body concerning for fracture of indeterminate age. MRI may be performed for further evaluation. Air-fluid levels noted superiorly in the mediastinum as well as anterior to the midthoracic spine. Potentially this may represent dilated esophagus, but CT scan of the chest is recommended for further evaluation. Electronically Signed   By: Marijo Conception M.D.   On: 08/21/2019 16:28   DG Lumbar Spine Complete  Result Date: 08/21/2019 CLINICAL DATA:  Lower back pain after fall over a month ago. EXAM: LUMBAR SPINE - COMPLETE 4+ VIEW COMPARISON:  None. FINDINGS: Diffuse osteopenia is noted. No fracture or significant spondylolisthesis is noted. Mild degenerative disc disease is noted at T12-L1. Severe degenerative disc disease is noted at L4-5. Atherosclerosis of abdominal aorta is noted. Large peripherally calcified abnormality is seen in the right lower quadrant of uncertain etiology. IMPRESSION: Multilevel degenerative disc disease. No acute abnormality seen in the lumbar spine. Large peripherally calcified abnormality seen in right lower quadrant of uncertain etiology; perhaps it may represent calcified renal cyst. Aortic Atherosclerosis (ICD10-I70.0). Electronically Signed   By: Marijo Conception M.D.   On: 08/21/2019 16:31   DG Scapula Right  Result Date: 08/21/2019 CLINICAL DATA:  Right scapular pain after fall. EXAM: RIGHT SCAPULA - 2+ VIEWS COMPARISON:  December 13, 2017. FINDINGS: There is no evidence of fracture or other focal bone lesions. Soft tissues are unremarkable. IMPRESSION: Negative. Electronically Signed   By: Marijo Conception M.D.   On: 08/21/2019 16:24    Assessment/Plan  Polymyalgia rheumatica (HCC) Doing well on Prednisone 5 mg Referal to Rheumatology next visit Patient has agreed  Weight loss -  Continues to be the issue Seeing GI right now fo rher Achalasia Barium Swallow is Pending  Hallucinations Would eventually need Neurology  Referal Will discuss on next visit  Anemia, unspecified type Hgb Stable On Iron Repeat CBC  Bilateral leg edema Got worse with reducing the dose of Lasix Increase it again to 20 mg QDwith Potassium Back Pain after Fall Xray showed Compression Fracture Cannot do MRI due to Pacemaker Pain Control for  now Age-related osteoporosis without current pathological fracture T Score in 2018was -2.9 Plan was to start Prolia Never Started  Will refer to Rheumatology with her PMR diagnosis and Achalasia  OSA Continues on CPAP S/P Pacemaker Follows with Cardiology Assisted Placement D/W daughter and husband  Plan to move both to Assisted Living for more care    Labs/tests ordered:  * No order type specified * Next appt:  10/04/2019  Total time spent in this patient care encounter was  45_  minutes; greater than 50% of the visit spent counseling patient and staff, reviewing records , Labs and coordinating care for problems addressed at this encounter.

## 2019-09-18 ENCOUNTER — Other Ambulatory Visit: Payer: Self-pay

## 2019-09-18 ENCOUNTER — Ambulatory Visit (HOSPITAL_COMMUNITY)
Admission: RE | Admit: 2019-09-18 | Discharge: 2019-09-18 | Disposition: A | Discharge status: Home / Self Care | Payer: Medicare Other | Source: Ambulatory Visit | Attending: Gastroenterology | Admitting: Gastroenterology

## 2019-09-18 DIAGNOSIS — K220 Achalasia of cardia: Secondary | ICD-10-CM | POA: Insufficient documentation

## 2019-10-04 ENCOUNTER — Other Ambulatory Visit: Payer: Self-pay

## 2019-10-04 DIAGNOSIS — M353 Polymyalgia rheumatica: Secondary | ICD-10-CM

## 2019-10-04 DIAGNOSIS — M810 Age-related osteoporosis without current pathological fracture: Secondary | ICD-10-CM

## 2019-10-05 ENCOUNTER — Other Ambulatory Visit: Payer: Self-pay | Admitting: Internal Medicine

## 2019-10-05 LAB — CBC WITH DIFFERENTIAL/PLATELET
Absolute Monocytes: 560 {cells}/uL (ref 200–950)
Basophils Absolute: 40 {cells}/uL (ref 0–200)
Basophils Relative: 0.8 %
Eosinophils Absolute: 90 {cells}/uL (ref 15–500)
Eosinophils Relative: 1.8 %
HCT: 34.5 % — ABNORMAL LOW (ref 35.0–45.0)
Hemoglobin: 11.3 g/dL — ABNORMAL LOW (ref 11.7–15.5)
Lymphs Abs: 1525 {cells}/uL (ref 850–3900)
MCH: 31.5 pg (ref 27.0–33.0)
MCHC: 32.8 g/dL (ref 32.0–36.0)
MCV: 96.1 fL (ref 80.0–100.0)
MPV: 10.8 fL (ref 7.5–12.5)
Monocytes Relative: 11.2 %
Neutro Abs: 2785 {cells}/uL (ref 1500–7800)
Neutrophils Relative %: 55.7 %
Platelets: 137 Thousand/uL — ABNORMAL LOW (ref 140–400)
RBC: 3.59 Million/uL — ABNORMAL LOW (ref 3.80–5.10)
RDW: 15 % (ref 11.0–15.0)
Total Lymphocyte: 30.5 %
WBC: 5 Thousand/uL (ref 3.8–10.8)

## 2019-10-05 LAB — COMPLETE METABOLIC PANEL WITH GFR
AG Ratio: 1.4 (calc) (ref 1.0–2.5)
ALT: 8 U/L (ref 6–29)
AST: 13 U/L (ref 10–35)
Albumin: 3.6 g/dL (ref 3.6–5.1)
Alkaline phosphatase (APISO): 59 U/L (ref 37–153)
BUN/Creatinine Ratio: 39 (calc) — ABNORMAL HIGH (ref 6–22)
BUN: 26 mg/dL — ABNORMAL HIGH (ref 7–25)
CO2: 29 mmol/L (ref 20–32)
Calcium: 10 mg/dL (ref 8.6–10.4)
Chloride: 102 mmol/L (ref 98–110)
Creat: 0.66 mg/dL (ref 0.60–0.88)
GFR, Est African American: 91 mL/min/{1.73_m2} (ref >=60)
GFR, Est Non African American: 79 mL/min/{1.73_m2} (ref >=60)
Globulin: 2.6 g/dL (calc) (ref 1.9–3.7)
Glucose, Bld: 108 mg/dL — ABNORMAL HIGH (ref 65–99)
Potassium: 3.5 mmol/L (ref 3.5–5.3)
Sodium: 142 mmol/L (ref 135–146)
Total Bilirubin: 0.4 mg/dL (ref 0.2–1.2)
Total Protein: 6.2 g/dL (ref 6.1–8.1)

## 2019-10-05 LAB — SEDIMENTATION RATE: Sed Rate: 33 mm/h — ABNORMAL HIGH (ref 0–30)

## 2019-10-05 LAB — C-REACTIVE PROTEIN: CRP: 26.1 mg/L — ABNORMAL HIGH (ref <8.0)

## 2019-10-05 LAB — VITAMIN D 25 HYDROXY (VIT D DEFICIENCY, FRACTURES): Vit D, 25-Hydroxy: 62 ng/mL (ref 30–100)

## 2019-10-05 NOTE — Telephone Encounter (Signed)
Would you like this medication filled for patient, and is this medication going to be long term?

## 2019-10-09 ENCOUNTER — Other Ambulatory Visit: Payer: Self-pay | Admitting: Internal Medicine

## 2019-10-10 ENCOUNTER — Encounter: Payer: Self-pay | Admitting: Internal Medicine

## 2019-10-10 ENCOUNTER — Non-Acute Institutional Stay: Payer: Medicare Other | Admitting: Internal Medicine

## 2019-10-10 ENCOUNTER — Other Ambulatory Visit: Payer: Self-pay

## 2019-10-10 VITALS — BP 126/84 | HR 88 | Temp 97.7°F | Ht 60.0 in | Wt 130.0 lb

## 2019-10-10 DIAGNOSIS — M810 Age-related osteoporosis without current pathological fracture: Secondary | ICD-10-CM

## 2019-10-10 DIAGNOSIS — R634 Abnormal weight loss: Secondary | ICD-10-CM

## 2019-10-10 DIAGNOSIS — R443 Hallucinations, unspecified: Secondary | ICD-10-CM

## 2019-10-10 DIAGNOSIS — M353 Polymyalgia rheumatica: Secondary | ICD-10-CM | POA: Diagnosis not present

## 2019-10-10 DIAGNOSIS — R1310 Dysphagia, unspecified: Secondary | ICD-10-CM

## 2019-10-10 DIAGNOSIS — D649 Anemia, unspecified: Secondary | ICD-10-CM

## 2019-10-10 DIAGNOSIS — R600 Localized edema: Secondary | ICD-10-CM | POA: Diagnosis not present

## 2019-10-10 DIAGNOSIS — R1319 Other dysphagia: Secondary | ICD-10-CM

## 2019-10-10 MED ORDER — PREDNISONE 5 MG PO TABS: 5.0000 mg | ORAL_TABLET | Freq: Every day | ORAL | 2 refills | Status: DC

## 2019-10-10 NOTE — Progress Notes (Signed)
Location: [place] of Service:  Clinic (12)  Provider:   Code Status: Goals of Care:  Advanced Directives 01/24/2019  Does Patient Have a Medical Advance Directive? Yes  Type of Advance Directive Out of facility DNR (pink MOST or yellow form);Healthcare Power of Attorney  Does patient want to make changes to medical advance directive? No - Patient declined  Copy of [place] in Chart? -  Would patient like information on creating a medical advance directive? -  Pre-existing out of facility DNR order (yellow form or pink MOST form) Yellow form placed in chart (order not valid for inpatient use);Pink MOST form placed in chart (order not valid for inpatient use)     Chief Complaint  Patient presents with  . Medical Management of Chronic Issues    4 weeek follow up. Discuss prednisone.     HPI: Patient is a 86 y.o. female seen today for an acute visit for Follow up  PMR Is stable. Does have overall weakness but no Proximal Muscle pain or weakness CRP is slightly high but ESR is in good range Continues to do her ADLS Achalasia Per GI repeat Barium was negative for any change from last year . Continue modified dietr Visual hallucinations Continue to be the issues. Still sees things around Occasionally No other Neurological Symptoms No falls  LE edema Continues to be issue in spite of increasing the Lasix to QD Compression fracture in Thoracic region Pain seems controlled on Tylenol Anemia Improved on Iron Weight loss Has gained some weight   But over all patient continues to decline physically. She and her husband are resisting transfer to [place]. Trying to hire help  Past Medical History:  Diagnosis Date  . Achalasia   . Anemia   . Arthritis    back  . Chronic steroid use   . Complete heart block ([place])    S/P PACEMAKER 2000 W/ GENERATOR CHANGE 2009  . Empyema, right ([place]) PULMOLOGIST-- DR CLANCE   VATS 06/23/2012 cultures  negative to date CXR 07/19/12 persistent airfluid levels/  CXR 11-01-2012 IMPROVE RIGHT PLEURAL EFFUSION  . GERD (gastroesophageal reflux disease)   . History of aspiration pneumonitis    DEC 2013  . History of hiatal hernia   . Hypertension   . Inguinal hernia    right  . Intrinsic urethral sphincter deficiency   . Megaesophagus   . Mixed stress and urge urinary incontinence   . Multinodular thyroid 06/26/2012   Multi nodular goiter. Large nodules in both lobes of the gland.  These nodules fit national criteria for fine needle aspiration  biopsy if not previously assessed.    . OSA on CPAP    cpap, doees not know settings  . Polymyalgia rheumatica (HCC)    on chronic Prednisone 53m daily  . RBBB   . S/P dilatation of esophageal stricture     Past Surgical History:  Procedure Laterality Date  . APPENDECTOMY  1953   w/ removal benign kidney tumor   . BALLOON DILATION  07/24/2012   Procedure: BALLOON DILATION;  Surgeon: RInda Castle MD;  [facility];  Service: Endoscopy;  Laterality: N/A;  . BALLOON DILATION N/A 12/03/2015   Procedure: BALLOON DILATION;  Surgeon: KMauri Pole MD;  [facility];  Service: Endoscopy;  Laterality: N/A;  pnuematic balloon  . BALLOON DILATION N/A 03/16/2017   Procedure: BALLOON DILATION;  Surgeon: NMauri Pole MD;  [facility]  Service:  Endoscopy;  Laterality: N/A;  PNUEMATIC BALLOONS  . BOTOX INJECTION  08/07/2012   Procedure: BOTOX INJECTION;  Surgeon: Inda Castle, MD;  [facility]: [facility];  Service: Endoscopy;  Laterality: N/A;  . BOTOX INJECTION N/A 05/24/2013   Procedure: MACROPLASTIQUE IMPLANT;  Surgeon: Irine Seal, MD;  Location: [facility];  Service: Urology;  Laterality: N/A;  . BOTOX INJECTION  02/25/2014   Procedure: BOTOX INJECTION;  Surgeon: Inda Castle, MD;  [facility]: [facility];  Service: Endoscopy;;  . CARDIAC PACEMAKER PLACEMENT  06/1999  DR RUTH GREENFIELD AT [place]  ( LAST PACER CHECK 05-09-2013) for CHB/   END-OF-LIFE GENERATOR CHANGE  2009  . CATARACT EXTRACTION W/ INTRAOCULAR LENS  IMPLANT, BILATERAL  2005  . DILATION AND CURETTAGE OF UTERUS    . ESOPHAGOGASTRODUODENOSCOPY  07/24/2012   Procedure: ESOPHAGOGASTRODUODENOSCOPY (EGD);  Surgeon: Inda Castle, MD;  [facility];  Service: Endoscopy;  Laterality: N/A;  . ESOPHAGOGASTRODUODENOSCOPY  08/07/2012   Procedure: ESOPHAGOGASTRODUODENOSCOPY (EGD);  Surgeon: Inda Castle, MD;  [facility];  Service: Endoscopy;  Laterality: N/A;  . ESOPHAGOGASTRODUODENOSCOPY N/A 02/25/2014   Procedure: ESOPHAGOGASTRODUODENOSCOPY (EGD);  Surgeon: Inda Castle, MD;  [facility];  Service: Endoscopy;  Laterality: N/A;  . ESOPHAGOGASTRODUODENOSCOPY N/A 03/16/2017   Procedure: ESOPHAGOGASTRODUODENOSCOPY (EGD);  Surgeon: Mauri Pole, MD;  [facility];  Service: Endoscopy;  Laterality: N/A;  . ESOPHAGOGASTRODUODENOSCOPY N/A 08/02/2018   Procedure: ESOPHAGOGASTRODUODENOSCOPY (EGD);  Surgeon: Mauri Pole, MD;  [facility];  Service: Endoscopy;  Laterality: N/A;  . ESOPHAGOGASTRODUODENOSCOPY (EGD) WITH ESOPHAGEAL DILATION  06/27/2012   Procedure: ESOPHAGOGASTRODUODENOSCOPY (EGD) WITH ESOPHAGEAL DILATION;  Surgeon: Ladene Artist, MD,FACG;  Location: [place];  Service: Endoscopy;  Laterality: N/A;  . ESOPHAGOGASTRODUODENOSCOPY (EGD) WITH PROPOFOL N/A 10/09/2015   Procedure: ESOPHAGOGASTRODUODENOSCOPY (EGD) WITH PROPOFOL ( WITH BOTOX);  Surgeon: Milus Banister, MD;  [facility];  Service: Endoscopy;  Laterality: N/A;  . ESOPHAGOGASTRODUODENOSCOPY (EGD) WITH PROPOFOL N/A 12/03/2015   Procedure: ESOPHAGOGASTRODUODENOSCOPY (EGD) WITH PROPOFOL;  Surgeon: Mauri Pole, MD;  Location: [place] [facility];  Service: Endoscopy;  Laterality: N/A;  . FOREIGN BODY REMOVAL  08/02/2018   Procedure: FOREIGN BODY REMOVAL;  Surgeon: Mauri Pole, MD;  [facility];  Service: Endoscopy;;  . PACEMAKER GENERATOR CHANGE  12/13/2007   at [facility]  . TOE SURGERY  2013   left 3rd toe HAMMERTOE REPAIR  . TONSILLECTOMY  AS CHILD  . TOTAL HIP ARTHROPLASTY Right 2001  . VERICOSE VEIN LIGATION    . VIDEO ASSISTED THORACOSCOPY (VATS)/DECORTICATION  06/23/2012   Procedure: VIDEO ASSISTED THORACOSCOPY (VATS)/DECORTICATION;  Surgeon: Grace Isaac, MD;  Location: [place];  Service: Thoracic;  Laterality: Right;  Marland Kitchen VIDEO ASSISTED THORACOSCOPY (VATS)/EMPYEMA     06/23/2012  . VIDEO BRONCHOSCOPY  06/23/2012   Procedure: VIDEO BRONCHOSCOPY;  Surgeon: Grace Isaac, MD;  [facility];  Service: Thoracic;  Laterality: N/A;    Allergies  Allergen Reactions  . Actonel [Risedronate Sodium] Other (See Comments)    Joint aches; rechallenged --caused joint aches  . Ivp Dye [Iodinated Diagnostic Agents] Nausea And Vomiting    Outpatient Encounter Medications as of 10/10/2019  Medication Sig  . acetaminophen (TYLENOL) 500 MG tablet Take 1,000 mg by mouth. Once or twice a day as needed  . AMBULATORY NON FORMULARY MEDICATION Medication Name: ultra zyme- Take 1 capsule twice daily  . calcium carbonate (TUMS EX) 750 MG chewable tablet Chew 1  tablet by mouth as needed for heartburn.  . Calcium-Phosphorus-Vitamin D (CALCIUM/VITAMIN D3/ADULT GUMMY PO) Take by mouth. 500 mg calcium, 1000 iu vitamin D3, Phosphorous 269m. 3 daily  . Collagen-Boron-Hyaluronic Acid ([employer] TRIPLE ACTION PO) Take 1 tablet by mouth daily.  . famotidine (PEPCID) 20 MG tablet Take 20 mg by mouth as needed for heartburn or indigestion.  . furosemide (LASIX) 20 MG tablet Take 20 mg by mouth daily.  .Marland KitchenKLOR-CON M20 20 MEQ tablet TAKE 1 TABLET BY MOUTH EVERY DAY  . Multiple Vitamins-Minerals (HM MULTIVITAMIN ADULT GUMMY PO) Take by mouth. 3 daily  . Multiple Vitamins-Minerals (OCUVITE EYE + MULTI PO) Take by mouth daily.  . predniSONE (DELTASONE) 5 MG tablet Take 1  tablet (5 mg total) by mouth daily.  . [DISCONTINUED] predniSONE (DELTASONE) 5 MG tablet TAKE 1 TABLET BY MOUTH EVERY DAY   No facility-administered encounter medications on file as of 10/10/2019.    Review of Systems:  Review of Systems  Constitutional: Positive for appetite change.  HENT: Negative.   Respiratory: Negative.   Cardiovascular: Positive for leg swelling.  Gastrointestinal: Negative.   Genitourinary: Negative.   Musculoskeletal: Positive for gait problem.  Neurological: Positive for weakness.  Psychiatric/Behavioral: Positive for hallucinations.    Health Maintenance  Topic Date Due  . TETANUS/TDAP  11/04/2025  . INFLUENZA VACCINE  Completed  . DEXA SCAN  Completed  . PNA vac Low Risk Adult  Completed    Physical Exam: Vitals:   10/10/19 1439  BP: 126/84  Pulse: 88  Temp: 97.7 F (36.5 C)  SpO2: 97%  Weight: 130 lb (59 kg)  Height: 5' (1.524 m)   Body mass index is 25.39 kg/m. Physical Exam Vitals reviewed.  Constitutional:      Appearance: Normal appearance.  HENT:     Head: Normocephalic.     Nose: Nose normal.     Mouth/Throat:     Mouth: Mucous membranes are moist.     Pharynx: Oropharynx is clear.  Eyes:     Pupils: Pupils are equal, round, and reactive to light.  Cardiovascular:     Rate and Rhythm: Normal rate and regular rhythm.  Pulmonary:     Effort: Pulmonary effort is normal. No respiratory distress.  Abdominal:     General: Abdomen is flat.     Palpations: Abdomen is soft.  Musculoskeletal:     Cervical back: Neck supple.     Comments: Moderate Swelling Bilateral  Skin:    General: Skin is warm.  Neurological:     General: No focal deficit present.     Mental Status: She is alert and oriented to person, place, and time.  Psychiatric:        Mood and Affect: Mood normal.     Labs reviewed: Basic Metabolic Panel: Recent Labs    02/26/19 0000 02/26/19 0000 05/03/19 0830 08/10/19 0934 10/03/19 0924  NA 144   < >  143 142 142  K 3.9   < > 3.7 4.1 3.5  CL 108   < > 104 105 102  CO2 29   < > _0 GLUCOSE 117*   < > 114* 118* 108*  BUN 28*   < > 24 26* 26*  CREATININE 0.58*   < > 0.53* 0.59* 0.66  CALCIUM 9.8   < > 9.6 9.9 10.0  TSH 0.62  --   --   --   --    < > = values in  this interval not displayed.   Liver Function Tests: Recent Labs    05/03/19 0830 08/10/19 0934 10/03/19 0924  AST _0 ALT _1 BILITOT 0.4 0.4 0.4  PROT 5.9* 6.4 6.2   No results for input(s): LIPASE, AMYLASE in the last 8760 hours. No results for input(s): AMMONIA in the last 8760 hours. CBC: Recent Labs    06/04/19 1502 08/10/19 0934 10/03/19 0924  WBC 7.3 7.4 5.0  NEUTROABS 4,008 4,381 2,785  HGB 10.1* 9.7* 11.3*  HCT 32.6* 29.8* 34.5*  MCV 91.3 96.8 96.1  PLT 189 183 137*   Lipid Panel: Recent Labs    02/26/19 0000  CHOL 129  HDL 39*  LDLCALC 76  TRIG 65  CHOLHDL 3.3   Lab Results  Component Value Date   HGBA1C 5.4 02/26/2019    Procedures since last visit: DG ESOPHAGUS W DOUBLE CM (HD)  Result Date: 09/18/2019 CLINICAL DATA:  Food gets stuck, history of achalasia EXAM: ESOPHOGRAM/BARIUM SWALLOW TECHNIQUE: Combined double contrast and single contrast examination performed using effervescent crystals, thick barium liquid, and thin barium liquid. FLUOROSCOPY TIME:  Fluoroscopy Time:  1 minutes 12 seconds Radiation Exposure Index (if provided by the fluoroscopic device): 9.6 mGy Number of Acquired Spot Images: 1 COMPARISON:  None. FINDINGS: Patient swallowed barium without difficulty. The esophagus is dilated and tortuous with multiple diverticula, some of which are large and retain contrast. Suspected retained debris within the esophagus. The gastroesophageal junction appears patent though there is slow passage of contrast. Motility is abnormal. No reflux evident. Overall appearance is similar to the prior study. IMPRESSION: Dilated and tortuous aorta with impaired motility and multiple  diverticula retaining contrast as seen previously. The gastroesophageal junction appears patent and without progressive narrowing but there is slow passage of contrast out of the esophagus. Electronically Signed   By: Macy Mis M.D.   On: 09/18/2019 10:04    Assessment/Plan Polymyalgia rheumatica (HCC) Continue Prednisone 5 mg QD Referral to Rheumatology for further guidance with Prednisone   Weight loss Weight has stabilized  Esophageal dysphagia Seen For Achalasia Per GI  Continue Modified Diet for now  Bilateral leg edema On Lasix QD Will wait to increase her dose as she is already weak Elevate legs and ted hoses for now  Anemia, unspecified type Hgb has improved on iron Hallucinations Discussed in detail with son and husband Needs Neurology Follow up Also Needs CT scan of head but since she is so frail right now will wait for further testing Reval next visit   Age-related osteoporosis without current pathological fracture T Score in 2018was -2.9 Plan was to start Prolia Never Started  Will refer to Rheumatology with her PMR diagnosis and Achalasia   Older Issues Back Pain after Fall Xray showed Compression Fracture Cannot do MRI due to Pacemaker Pain Control for now OSA Continues on CPAP S/P Pacemaker Follows with Cardiology  Shingrix Ordered in 2018 not Sure if she took it.  Labs/tests ordered:  * No order type specified * Next appt:  11/29/2019  Total time spent in this patient care encounter was  45_  minutes; greater than 50% of the visit spent counseling patient and staff, reviewing records , Labs and coordinating care for problems addressed at this encounter.

## 2019-10-18 ENCOUNTER — Inpatient Hospital Stay (HOSPITAL_COMMUNITY)
Admission: EM | Admit: 2019-10-18 | Discharge: 2019-10-22 | DRG: 291 | Disposition: A | Discharge status: Home Health | Payer: Medicare Other | Attending: Internal Medicine | Admitting: Internal Medicine

## 2019-10-18 ENCOUNTER — Other Ambulatory Visit: Payer: Self-pay

## 2019-10-18 ENCOUNTER — Encounter (HOSPITAL_COMMUNITY): Payer: Self-pay | Admitting: *Deleted

## 2019-10-18 ENCOUNTER — Inpatient Hospital Stay (HOSPITAL_COMMUNITY): Payer: Medicare Other

## 2019-10-18 ENCOUNTER — Emergency Department (HOSPITAL_COMMUNITY): Payer: Medicare Other

## 2019-10-18 DIAGNOSIS — L89151 Pressure ulcer of sacral region, stage 1: Secondary | ICD-10-CM

## 2019-10-18 DIAGNOSIS — Z96641 Presence of right artificial hip joint: Secondary | ICD-10-CM | POA: Diagnosis present

## 2019-10-18 DIAGNOSIS — R1314 Dysphagia, pharyngoesophageal phase: Secondary | ICD-10-CM | POA: Diagnosis present

## 2019-10-18 DIAGNOSIS — I442 Atrioventricular block, complete: Secondary | ICD-10-CM | POA: Diagnosis present

## 2019-10-18 DIAGNOSIS — E042 Nontoxic multinodular goiter: Secondary | ICD-10-CM | POA: Diagnosis present

## 2019-10-18 DIAGNOSIS — Z801 Family history of malignant neoplasm of trachea, bronchus and lung: Secondary | ICD-10-CM | POA: Diagnosis not present

## 2019-10-18 DIAGNOSIS — Z20822 Contact with and (suspected) exposure to covid-19: Secondary | ICD-10-CM | POA: Diagnosis present

## 2019-10-18 DIAGNOSIS — Z8249 Family history of ischemic heart disease and other diseases of the circulatory system: Secondary | ICD-10-CM | POA: Diagnosis not present

## 2019-10-18 DIAGNOSIS — I495 Sick sinus syndrome: Secondary | ICD-10-CM | POA: Diagnosis present

## 2019-10-18 DIAGNOSIS — R1310 Dysphagia, unspecified: Secondary | ICD-10-CM | POA: Diagnosis not present

## 2019-10-18 DIAGNOSIS — Z7952 Long term (current) use of systemic steroids: Secondary | ICD-10-CM | POA: Diagnosis not present

## 2019-10-18 DIAGNOSIS — E876 Hypokalemia: Secondary | ICD-10-CM | POA: Diagnosis present

## 2019-10-18 DIAGNOSIS — Z91041 Radiographic dye allergy status: Secondary | ICD-10-CM | POA: Diagnosis not present

## 2019-10-18 DIAGNOSIS — I110 Hypertensive heart disease with heart failure: Secondary | ICD-10-CM | POA: Diagnosis present

## 2019-10-18 DIAGNOSIS — K220 Achalasia of cardia: Secondary | ICD-10-CM | POA: Diagnosis not present

## 2019-10-18 DIAGNOSIS — Z833 Family history of diabetes mellitus: Secondary | ICD-10-CM | POA: Diagnosis not present

## 2019-10-18 DIAGNOSIS — R1319 Other dysphagia: Secondary | ICD-10-CM

## 2019-10-18 DIAGNOSIS — Z888 Allergy status to other drugs, medicaments and biological substances status: Secondary | ICD-10-CM | POA: Diagnosis not present

## 2019-10-18 DIAGNOSIS — Z66 Do not resuscitate: Secondary | ICD-10-CM | POA: Diagnosis present

## 2019-10-18 DIAGNOSIS — M1990 Unspecified osteoarthritis, unspecified site: Secondary | ICD-10-CM | POA: Diagnosis present

## 2019-10-18 DIAGNOSIS — J90 Pleural effusion, not elsewhere classified: Secondary | ICD-10-CM | POA: Diagnosis present

## 2019-10-18 DIAGNOSIS — L89309 Pressure ulcer of unspecified buttock, unspecified stage: Secondary | ICD-10-CM | POA: Diagnosis present

## 2019-10-18 DIAGNOSIS — I509 Heart failure, unspecified: Secondary | ICD-10-CM

## 2019-10-18 DIAGNOSIS — M353 Polymyalgia rheumatica: Secondary | ICD-10-CM | POA: Diagnosis present

## 2019-10-18 DIAGNOSIS — N3946 Mixed incontinence: Secondary | ICD-10-CM | POA: Diagnosis present

## 2019-10-18 DIAGNOSIS — I5031 Acute diastolic (congestive) heart failure: Secondary | ICD-10-CM | POA: Diagnosis present

## 2019-10-18 DIAGNOSIS — G4733 Obstructive sleep apnea (adult) (pediatric): Secondary | ICD-10-CM | POA: Diagnosis present

## 2019-10-18 DIAGNOSIS — J9601 Acute respiratory failure with hypoxia: Secondary | ICD-10-CM | POA: Diagnosis present

## 2019-10-18 DIAGNOSIS — K219 Gastro-esophageal reflux disease without esophagitis: Secondary | ICD-10-CM | POA: Diagnosis present

## 2019-10-18 DIAGNOSIS — L8990 Pressure ulcer of unspecified site, unspecified stage: Secondary | ICD-10-CM | POA: Diagnosis present

## 2019-10-18 DIAGNOSIS — Z950 Presence of cardiac pacemaker: Secondary | ICD-10-CM | POA: Diagnosis present

## 2019-10-18 DIAGNOSIS — Z8261 Family history of arthritis: Secondary | ICD-10-CM

## 2019-10-18 DIAGNOSIS — D696 Thrombocytopenia, unspecified: Secondary | ICD-10-CM | POA: Diagnosis not present

## 2019-10-18 DIAGNOSIS — R609 Edema, unspecified: Secondary | ICD-10-CM

## 2019-10-18 DIAGNOSIS — E785 Hyperlipidemia, unspecified: Secondary | ICD-10-CM | POA: Diagnosis present

## 2019-10-18 LAB — ECHOCARDIOGRAM COMPLETE
Height: 60 in
Weight: 2080 oz

## 2019-10-18 LAB — TROPONIN I (HIGH SENSITIVITY)
Troponin I (High Sensitivity): 14 ng/L (ref <18)
Troponin I (High Sensitivity): 15 ng/L (ref <18)

## 2019-10-18 LAB — BASIC METABOLIC PANEL
Anion gap: 11 (ref 5–15)
BUN: 22 mg/dL (ref 8–23)
CO2: 29 mmol/L (ref 22–32)
Calcium: 9.6 mg/dL (ref 8.9–10.3)
Chloride: 103 mmol/L (ref 98–111)
Creatinine, Ser: 0.66 mg/dL (ref 0.44–1.00)
GFR calc Af Amer: >60 mL/min (ref >60)
GFR calc non Af Amer: >60 mL/min (ref >60)
Glucose, Bld: 117 mg/dL — ABNORMAL HIGH (ref 70–99)
Potassium: 3.4 mmol/L — ABNORMAL LOW (ref 3.5–5.1)
Sodium: 143 mmol/L (ref 135–145)

## 2019-10-18 LAB — CBC
HCT: 40.2 % (ref 36.0–46.0)
Hemoglobin: 12.6 g/dL (ref 12.0–15.0)
MCH: 31.7 pg (ref 26.0–34.0)
MCHC: 31.3 g/dL (ref 30.0–36.0)
MCV: 101.3 fL — ABNORMAL HIGH (ref 80.0–100.0)
Platelets: 118 10*3/uL — ABNORMAL LOW (ref 150–400)
RBC: 3.97 MIL/uL (ref 3.87–5.11)
RDW: 15.9 % — ABNORMAL HIGH (ref 11.5–15.5)
WBC: 6.5 10*3/uL (ref 4.0–10.5)
nRBC: 0 % (ref 0.0–0.2)

## 2019-10-18 LAB — SARS CORONAVIRUS 2 (TAT 6-24 HRS): SARS Coronavirus 2: NEGATIVE

## 2019-10-18 LAB — BRAIN NATRIURETIC PEPTIDE: B Natriuretic Peptide: 122.9 pg/mL — ABNORMAL HIGH (ref 0.0–100.0)

## 2019-10-18 LAB — TSH: TSH: 0.517 u[IU]/mL (ref 0.350–4.500)

## 2019-10-18 MED ORDER — ENOXAPARIN SODIUM 40 MG/0.4ML ~~LOC~~ SOLN
40.0000 mg | SUBCUTANEOUS | Status: DC
  Administered 2019-10-18 – 2019-10-19 (×2): 40 mg via SUBCUTANEOUS
  Filled 2019-10-18 (×2): qty 0.4

## 2019-10-18 MED ORDER — SODIUM CHLORIDE 0.9% FLUSH: 3.0000 mL | INTRAVENOUS | Status: DC | PRN

## 2019-10-18 MED ORDER — FUROSEMIDE 10 MG/ML IJ SOLN
40.0000 mg | Freq: Two times a day (BID) | INTRAMUSCULAR | Status: DC
  Administered 2019-10-18 – 2019-10-19 (×3): 40 mg via INTRAVENOUS
  Filled 2019-10-18 (×3): qty 4

## 2019-10-18 MED ORDER — SODIUM CHLORIDE 0.9 % IV SOLN: 250.0000 mL | INTRAVENOUS | Status: DC | PRN

## 2019-10-18 MED ORDER — FAMOTIDINE 20 MG PO TABS: 20.0000 mg | ORAL_TABLET | Freq: Every day | ORAL | Status: DC | PRN

## 2019-10-18 MED ORDER — ONDANSETRON HCL 4 MG/2ML IJ SOLN: 4.0000 mg | Freq: Four times a day (QID) | INTRAMUSCULAR | Status: DC | PRN

## 2019-10-18 MED ORDER — PREDNISONE 5 MG PO TABS
5.0000 mg | ORAL_TABLET | Freq: Every day | ORAL | Status: DC
  Administered 2019-10-18 – 2019-10-22 (×5): 5 mg via ORAL
  Filled 2019-10-18 (×5): qty 1

## 2019-10-18 MED ORDER — FUROSEMIDE 10 MG/ML IJ SOLN
40.0000 mg | INTRAMUSCULAR | Status: AC
  Administered 2019-10-18: 40 mg via INTRAVENOUS
  Filled 2019-10-18: qty 4

## 2019-10-18 MED ORDER — ACETAMINOPHEN 325 MG PO TABS: 650.0000 mg | ORAL_TABLET | ORAL | Status: DC | PRN

## 2019-10-18 MED ORDER — FAMOTIDINE 20 MG PO TABS
20.0000 mg | ORAL_TABLET | Freq: Every day | ORAL | Status: DC
  Administered 2019-10-18 – 2019-10-22 (×5): 20 mg via ORAL
  Filled 2019-10-18 (×5): qty 1

## 2019-10-18 MED ORDER — SODIUM CHLORIDE 0.9% FLUSH: 3.0000 mL | Freq: Once | INTRAVENOUS | Status: DC

## 2019-10-18 MED ORDER — SODIUM CHLORIDE 0.9% FLUSH
3.0000 mL | Freq: Two times a day (BID) | INTRAVENOUS | Status: DC
  Administered 2019-10-18 – 2019-10-22 (×8): 3 mL via INTRAVENOUS

## 2019-10-18 MED ORDER — POTASSIUM CHLORIDE CRYS ER 20 MEQ PO TBCR
40.0000 meq | EXTENDED_RELEASE_TABLET | ORAL | Status: AC
  Administered 2019-10-18: 10:00:00 40 meq via ORAL
  Filled 2019-10-18: qty 2

## 2019-10-18 NOTE — ED Notes (Signed)
Pt's sats 88-90%, placed pt on 2L [place]- improved to 94%.

## 2019-10-18 NOTE — H&P (Signed)
History and Physical    CARMELITE SHARE [id number] DOB: 10/10/1928 DOA: 10/18/2019  Referring MD/NP/PA: Shela Leff, MD PCP: [name], MD  Patient coming from: [place] living at friend's home  Chief Complaint: Cough  I have personally briefly reviewed patient's old medical records in [place]   HPI: [name] is a 86 y.o. female with medical history significant of HTN, CHB s/p PM, PMR, OSA on CPAP, and achalasia presents with complaints of cough and shortness of breath.  History obtained from the patient as well as her daughter.  At baseline patient had been in independent living at friend's home, but over the last year daughter notes that her memory has declined and is not necessarily always oriented to time and place.  Patient reports that the symptoms have been present for 1 week, but daughter notes that it has been longer than that.  She is coughing up frothy white sputum and at times just spitting large amounts up.  Notes that she has had associated symptoms of poor appetite, orthopnea sleeping in chair or propped up on pillows, and leg swelling present since November 2020.  Her PCP had put her on low-dose furosemide 10 mg daily, but they had not noticed any change in symptoms.  Patient was having more difficulty getting around recently and family was in the process of trying to get her change from independent living or finding more assistance.  She has not had any reports of fever, chest pain, nausea, vomiting.  Daughter is concerned for possible aspiration as patient has not been following pureed diet previously recommended.  ED Course: Upon admission into the emergency department patient was seen to be afebrile, respirations 21-33, blood pressure is elevated up to 172/87, and O2 saturations maintained 92 to 96% on room air.  Labs significant for platelets 118, potassium 3.4, troponin is negative x2, and BNP 122.9.  Chest x-ray significant for chronic lung  markings without infiltrate, dilated esophagus unchanged, and bilateral pleural effusions.  Patient was given 40 mg of Lasix IV.  TRH called to admit for suspected CHF exacerbation.  Review of Systems  Constitutional: Positive for malaise/fatigue. Negative for fever.  HENT: Negative for nosebleeds and sinus pain.   Eyes: Negative for pain.  Respiratory: Positive for cough, sputum production and shortness of breath.   Cardiovascular: Positive for orthopnea and leg swelling. Negative for chest pain and palpitations.  Gastrointestinal: Negative for abdominal pain, nausea and vomiting.  Genitourinary: Negative for dysuria and hematuria.  Musculoskeletal: Positive for joint pain.  Skin: Negative for rash.  Neurological: Positive for dizziness and weakness. Negative for loss of consciousness.  Endo/Heme/Allergies: Negative for polydipsia.  Psychiatric/Behavioral: Positive for memory loss. Negative for substance abuse.    Past Medical History:  Diagnosis Date  . Achalasia   . Anemia   . Arthritis    back  . Chronic steroid use   . Complete heart block ([place])    S/P PACEMAKER 2000 W/ GENERATOR CHANGE 2009  . Empyema, right ([place]) PULMOLOGIST-- DR CLANCE   VATS 06/23/2012 cultures negative to date CXR 07/19/12 persistent airfluid levels/  CXR 11-01-2012 IMPROVE RIGHT PLEURAL EFFUSION  . GERD (gastroesophageal reflux disease)   . History of aspiration pneumonitis    DEC 2013  . History of hiatal hernia   . Hypertension   . Inguinal hernia    right  . Intrinsic urethral sphincter deficiency   . Megaesophagus   . Mixed stress and urge urinary incontinence   .  Multinodular thyroid 06/26/2012   Multi nodular goiter. Large nodules in both lobes of the gland.  These nodules fit national criteria for fine needle aspiration  biopsy if not previously assessed.    . OSA on CPAP    cpap, doees not know settings  . Polymyalgia rheumatica (HCC)    on chronic Prednisone 5mg  daily  . RBBB   . S/P  dilatation of esophageal stricture     Past Surgical History:  Procedure Laterality Date  . APPENDECTOMY  1953   w/ removal benign kidney tumor   . BALLOON DILATION  07/24/2012   Procedure: BALLOON DILATION;  Surgeon: Inda Castle, MD;  Location: [facility];  Service: Endoscopy;  Laterality: N/A;  . BALLOON DILATION N/A 12/03/2015   Procedure: BALLOON DILATION;  Surgeon: Mauri Pole, MD;  Location: [facility];  Service: Endoscopy;  Laterality: N/A;  pnuematic balloon  . BALLOON DILATION N/A 03/16/2017   Procedure: BALLOON DILATION;  Surgeon: Mauri Pole, MD;  Location: [facility];  Service: Endoscopy;  Laterality: N/A;  PNUEMATIC BALLOONS  . BOTOX INJECTION  08/07/2012   Procedure: BOTOX INJECTION;  Surgeon: Inda Castle, MD;  Location: [facility];  Service: Endoscopy;  Laterality: N/A;  . BOTOX INJECTION N/A 05/24/2013   Procedure: MACROPLASTIQUE IMPLANT;  Surgeon: Irine Seal, MD;  Location: [facility];  Service: Urology;  Laterality: N/A;  . BOTOX INJECTION  02/25/2014   Procedure: BOTOX INJECTION;  Surgeon: Inda Castle, MD;  Location: [facility];  Service: Endoscopy;;  . CARDIAC PACEMAKER PLACEMENT  06/1999  DR RUTH GREENFIELD AT [facility] [facility]  ( LAST PACER CHECK 05-09-2013) for CHB/   END-OF-LIFE GENERATOR CHANGE  2009  . CATARACT EXTRACTION W/ INTRAOCULAR LENS  IMPLANT, BILATERAL  2005  . DILATION AND CURETTAGE OF UTERUS    . ESOPHAGOGASTRODUODENOSCOPY  07/24/2012   Procedure: ESOPHAGOGASTRODUODENOSCOPY (EGD);  Surgeon: Inda Castle, MD;  Location: [facility];  Service: Endoscopy;  Laterality: N/A;  . ESOPHAGOGASTRODUODENOSCOPY  08/07/2012   Procedure: ESOPHAGOGASTRODUODENOSCOPY (EGD);  Surgeon: Inda Castle, MD;  Location: [facility];  Service: Endoscopy;  Laterality: N/A;  . ESOPHAGOGASTRODUODENOSCOPY N/A 02/25/2014   Procedure: ESOPHAGOGASTRODUODENOSCOPY (EGD);  Surgeon: Inda Castle, MD;  Location: [facility];  Service:  Endoscopy;  Laterality: N/A;  . ESOPHAGOGASTRODUODENOSCOPY N/A 03/16/2017   Procedure: ESOPHAGOGASTRODUODENOSCOPY (EGD);  Surgeon: Mauri Pole, MD;  Location: [facility];  Service: Endoscopy;  Laterality: N/A;  . ESOPHAGOGASTRODUODENOSCOPY N/A 08/02/2018   Procedure: ESOPHAGOGASTRODUODENOSCOPY (EGD);  Surgeon: Mauri Pole, MD;  Location: [facility];  Service: Endoscopy;  Laterality: N/A;  . ESOPHAGOGASTRODUODENOSCOPY (EGD) WITH ESOPHAGEAL DILATION  06/27/2012   Procedure: ESOPHAGOGASTRODUODENOSCOPY (EGD) WITH ESOPHAGEAL DILATION;  Surgeon: Ladene Artist, MD,FACG;  Location: [facility];  Service: Endoscopy;  Laterality: N/A;  . ESOPHAGOGASTRODUODENOSCOPY (EGD) WITH PROPOFOL N/A 10/09/2015   Procedure: ESOPHAGOGASTRODUODENOSCOPY (EGD) WITH PROPOFOL ( WITH BOTOX);  Surgeon: Milus Banister, MD;  Location: [facility];  Service: Endoscopy;  Laterality: N/A;  . ESOPHAGOGASTRODUODENOSCOPY (EGD) WITH PROPOFOL N/A 12/03/2015   Procedure: ESOPHAGOGASTRODUODENOSCOPY (EGD) WITH PROPOFOL;  Surgeon: Mauri Pole, MD;  Location: [facility];  Service: Endoscopy;  Laterality: N/A;  . FOREIGN BODY REMOVAL  08/02/2018   Procedure: FOREIGN BODY REMOVAL;  Surgeon: Mauri Pole, MD;  Location: [facility];  Service: Endoscopy;;  . PACEMAKER GENERATOR CHANGE  12/13/2007   at [facility]  . TOE SURGERY  2013   left 3rd toe HAMMERTOE REPAIR  . TONSILLECTOMY  AS CHILD  .  TOTAL HIP ARTHROPLASTY Right 2001  . VERICOSE VEIN LIGATION    . VIDEO ASSISTED THORACOSCOPY (VATS)/DECORTICATION  06/23/2012   Procedure: VIDEO ASSISTED THORACOSCOPY (VATS)/DECORTICATION;  Surgeon: Grace Isaac, MD;  Location: [facility];  Service: Thoracic;  Laterality: Right;  Marland Kitchen VIDEO ASSISTED THORACOSCOPY (VATS)/EMPYEMA     06/23/2012  . VIDEO BRONCHOSCOPY  06/23/2012   Procedure: VIDEO BRONCHOSCOPY;  Surgeon: Grace Isaac, MD;  Location: [facility];  Service: Thoracic;  Laterality: N/A;     reports that she is a  non-smoker but has been exposed to tobacco smoke. She has never used smokeless tobacco. She reports current alcohol use of about 3.0 standard drinks of alcohol per week. She reports that she does not use drugs.  Allergies  Allergen Reactions  . Actonel [Risedronate Sodium] Other (See Comments)    Joint aches; rechallenged --caused joint aches  . Ivp Dye [Iodinated Diagnostic Agents] Nausea And Vomiting    Family History  Problem Relation Age of Onset  . Arthritis Father   . Heart disease Father   . Cancer Brother        ?spine  . Heart disease Brother   . Lung cancer Brother   . Diabetes Son   . Heart disease Mother        Congestive heart failure  . Heart disease Brother   . Arthritis Brother   . Hypertension Brother   . Colon cancer Neg Hx     Prior to Admission medications   Medication Sig Start Date End Date Taking? Authorizing Provider  acetaminophen (TYLENOL) 500 MG tablet Take 1,000 mg by mouth. Once or twice a day as needed    [provider]  AMBULATORY NON FORMULARY MEDICATION Medication Name: ultra zyme- Take 1 capsule twice daily    [provider]  calcium carbonate (TUMS EX) 750 MG chewable tablet Chew 1 tablet by mouth as needed for heartburn.    [provider]  Calcium-Phosphorus-Vitamin D (CALCIUM/VITAMIN D3/ADULT GUMMY PO) Take by mouth. 500 mg calcium, 1000 iu vitamin D3, Phosphorous 230mg . 3 daily    [provider]  Collagen-Boron-Hyaluronic Acid ([employer] JO[employer] HEALTH TRIPLE ACTION PO) Take 1 tablet by mouth daily.    [provider]  famotidine (PEPCID) 20 MG tablet Take 20 mg by mouth as needed for heartburn or indigestion.    [provider]  furosemide (LASIX) 20 MG tablet Take 20 mg by mouth daily.    [provider]  KLOR-CON M20 20 MEQ tablet TAKE 1 TABLET BY MOUTH EVERY DAY 10/09/19   [name], MD  Multiple Vitamins-Minerals (HM MULTIVITAMIN ADULT GUMMY PO) Take by mouth. 3 daily     [provider]  Multiple Vitamins-Minerals (OCUVITE EYE + MULTI PO) Take by mouth daily.    [provider]  predniSONE (DELTASONE) 5 MG tablet Take 1 tablet (5 mg total) by mouth daily. 10/10/19   [name], MD    Physical Exam:  Constitutional: Elderly female who appears to be in discomfort intermittently coughing the large amounts of frothy white sputum Vitals:   10/18/19 0042 10/18/19 0043 10/18/19 0343 10/18/19 0700  BP:  116/73 (!) 172/87 111/90  Pulse:  90 90 91  Resp:  16 17 (!) 22  Temp:  (!) 97.4 F (36.3 C)    TempSrc:  Oral    SpO2:  95% 96% 92%  Weight: 59 kg     Height: 5' (1.524 m)      Eyes: PERRL, lids  and conjunctivae normal ENMT: Mucous membranes are moist. Posterior pharynx clear of any exudate or lesions.  Neck: normal, supple, no masses, no thyromegaly. Respiratory: Bilateral rales appreciated without wheezing.  Currently on room air.  Presence of change in patient's voice. Cardiovascular: Regular rate and rhythm, no murmurs / rubs / gallops.  At least 2+ bilateral pitting extremity edema. 2+ pedal pulses. No carotid bruits.  Abdomen: no tenderness, no masses palpated. No hepatosplenomegaly. Bowel sounds positive.  Musculoskeletal: no clubbing / cyanosis. No joint deformity upper and lower extremities. Good ROM, no contractures. Normal muscle tone.   Skin: no rashes, lesions, ulcers. No induration Neurologic: CN 2-12 grossly intact. Sensation intact, DTR normal. Strength 5/5 in all 4.  Psychiatric: Poor recent memory.  Alert and oriented x person. Normal mood.     Labs on Admission: I have personally reviewed following labs and imaging studies  CBC: Recent Labs  Lab 10/18/19 0047  WBC 6.5  HGB 12.6  HCT 40.2  MCV 101.3*  PLT 123456*   Basic Metabolic Panel: Recent Labs  Lab 10/18/19 0047  NA 143  K 3.4*  CL 103  CO2 29  GLUCOSE 117*  BUN 22  CREATININE 0.66  CALCIUM 9.6   GFR: Estimated Creatinine Clearance: 39.1  mL/min (by C-G formula based on SCr of 0.66 mg/dL). Liver Function Tests: No results for input(s): AST, ALT, ALKPHOS, BILITOT, PROT, ALBUMIN in the last 168 hours. No results for input(s): LIPASE, AMYLASE in the last 168 hours. No results for input(s): AMMONIA in the last 168 hours. Coagulation Profile: No results for input(s): INR, PROTIME in the last 168 hours. Cardiac Enzymes: No results for input(s): CKTOTAL, CKMB, CKMBINDEX, TROPONINI in the last 168 hours. BNP (last 3 results) No results for input(s): PROBNP in the last 8760 hours. HbA1C: No results for input(s): HGBA1C in the last 72 hours. CBG: No results for input(s): GLUCAP in the last 168 hours. Lipid Profile: No results for input(s): CHOL, HDL, LDLCALC, TRIG, CHOLHDL, LDLDIRECT in the last 72 hours. Thyroid Function Tests: No results for input(s): TSH, T4TOTAL, FREET4, T3FREE, THYROIDAB in the last 72 hours. Anemia Panel: No results for input(s): VITAMINB12, FOLATE, FERRITIN, TIBC, IRON, RETICCTPCT in the last 72 hours. Urine analysis:    Component Value Date/Time   COLORURINE YELLOW 07/25/2012 1712   APPEARANCEUR CLEAR 07/25/2012 1712   LABSPEC 1.010 07/25/2012 1712   PHURINE 7.5 07/25/2012 1712   GLUCOSEU NEGATIVE 07/25/2012 1712   HGBUR NEGATIVE 07/25/2012 1712   BILIRUBINUR NEGATIVE 07/25/2012 1712   KETONESUR NEGATIVE 07/25/2012 1712   PROTEINUR NEGATIVE 07/25/2012 1712   UROBILINOGEN 0.2 07/25/2012 1712   NITRITE NEGATIVE 07/25/2012 1712   LEUKOCYTESUR NEGATIVE 07/25/2012 1712   Sepsis Labs: No results found for this or any previous visit (from the past 240 hour(s)).   Radiological Exams on Admission: DG Chest 2 View  Result Date: 10/18/2019 CLINICAL DATA:  Cough and shortness of breath. EXAM: CHEST - 2 VIEW COMPARISON:  October 09, 2015 FINDINGS: There is a dual lead AICD. The lungs are hyperinflated. Mild chronic appearing increased lung markings are seen without evidence of acute infiltrate. There is mild  blunting of the bilateral costophrenic angles. No pneumothorax is seen. The heart size and mediastinal contours are within normal limits. The there is a large hiatal hernia with a markedly dilated esophagus. This is stable in appearance when compared to the prior exam. Degenerative changes are noted throughout the thoracic spine. IMPRESSION: 1. Chronic appearing increased lung markings without evidence of  an acute infiltrate. 2. Large hiatal hernia with a markedly dilated esophagus, unchanged in appearance when compared to the prior study dated October 09, 2015. 3. Small bilateral pleural effusions. Electronically Signed   By: Virgina Norfolk M.D.   On: 10/18/2019 01:16    EKG: Independently reviewed.  Sinus rhythm with fusion complexes at 92 bpm  Assessment/Plan Bilateral pleural effusions secondary to suspected CHF: Acute.  Patient presents with shortness of breath and cough of white sputum production.  Reports orthopnea.  On physical exam at least 2+ pitting edema.  BNP mildly elevated at 122.9 and chest x-ray concerning for bilateral pleural effusions.  Review of records notes patient had a loculated right pleural effusion with possible concern bronchopleural fistula in 2014. -Admit to a telemetry bed -Heart failure orders set  initiated  -Continuous pulse oximetry with nasal cannula oxygen as needed to keep O2 saturations >92% -Strict I&Os and daily weights -Elevate lower extremities -Flutter valve and incentive spirometry -Check TSH -Lasix 40 mg twice daily -Reassess in a.m. and adjust diuresis as needed. -Check echocardiogram -PT/OT consult -May warrant cardiology consultation depending on echocardiogram. -Patient unable to lay flat at this time consider obtaining a CT of the chest with contrast when patient able to lay flat for further evaluation given her history    Hypokalemia: Acute.  Potassium 3.4.  Patient had been on 20 mEq daily while taking furosemide . -Give 40 mEq of potassium  chloride x1 dose now -Continue to monitor and replace as needed  Polymyalgia rheumatica on chronic steroids: Patient on prednisone 5 mg daily.  Last sed rate noted to be 33 on 3/24. -Continue prednisone  Thrombocytopenia: Acute on chronic.  Platelet count 118, but appears to intermittently be low throughout available records for review. -Continue to monitor  CHB s/p PM -Pacemaker to be interrogated  GERD, Achalasia, Esophageal dysphagia: Patient with history of reflux and achalasia requiring previous esophageal dilation.  At baseline patient is supposed to be on pured diet but does not follow this at home. -Aspiration precautions -Change Pepcid to schedule -Soft diet   Debility -Follow-up PT/OT consult  Pressure ulcer sacrum: stage 1 -Low air loss mattress replacement   DVT prophylaxis: Lovenox Code Status: Full Family Communication: Daughter updated at bedside Disposition Plan: To be determined Consults called: None Admission status: Inpatient  Norval Morton MD [facility] Pager [phone]   If 7PM-7AM, please contact night-coverage [email or web address] Password [facility]  10/18/2019, 7:33 AM

## 2019-10-18 NOTE — Progress Notes (Signed)
PT placed on home cpap

## 2019-10-18 NOTE — Progress Notes (Signed)
Echocardiogram 2D Echocardiogram has been performed.  Oneal Deputy Kynzlee Hucker 10/18/2019, 10:57 AM

## 2019-10-18 NOTE — ED Notes (Signed)
Ordered heart healthy meal tray for pt. 

## 2019-10-18 NOTE — ED Notes (Signed)
Lunch ordered 

## 2019-10-18 NOTE — Progress Notes (Signed)
Patient arrived to room with daughter at bedside. Patient oriented to room and advised about visitation policy. [name] granted permission for daughter to stay due to patient getting confused at night.

## 2019-10-18 NOTE — ED Provider Notes (Signed)
[facility] EMERGENCY DEPARTMENT Provider Note   CSN: [id number] Arrival date & time: 10/18/19  0029     History Chief Complaint  Patient presents with  . Cough    [name] is a 86 y.o. female.  The history is provided by the patient and medical records.  Cough Associated symptoms: shortness of breath     86 y.o. F with hx of anemia, arthritis, HTN, OSA, mega-esophagus, presenting to the ED for cough and SOB.  Patient reports for the past several days she has been having a cough, worse at night and causing her difficulty sleeping.  She is coughing up some frothy, white sputum.  States often when lying flat she does feel short of breath, sitting up quite a few times in the middle of the night to feel like she can breathe normally again.  She has not had any fever or chills.  No sick contacts.  She is not had any chest pain.  She has had ongoing issues with peripheral edema since about November.  She was previously on PRN Lasix to help with edema, however as of late she has been taking this daily without any change.  Daughter reports the swelling actually seems to be getting worse.  She has not noticed any increase in urine output either.  She has not had any formal work-up for possible congestive heart failure.  She has had recurrence of her polymyalgia rheumatica and is currently back on daily steroids.  Patient is not oxygen dependent, she does use CPAP at night.  She does endorse some difficulty and intermittent lightheadedness when trying to get around recently that is new for her.  Past Medical History:  Diagnosis Date  . Achalasia   . Anemia   . Arthritis    back  . Chronic steroid use   . Complete heart block ([place])    S/P PACEMAKER 2000 W/ GENERATOR CHANGE 2009  . Empyema, right ([place]) PULMOLOGIST-- DR CLANCE   VATS 06/23/2012 cultures negative to date CXR 07/19/12 persistent airfluid levels/  CXR 11-01-2012 IMPROVE RIGHT PLEURAL EFFUSION  . GERD  (gastroesophageal reflux disease)   . History of aspiration pneumonitis    DEC 2013  . History of hiatal hernia   . Hypertension   . Inguinal hernia    right  . Intrinsic urethral sphincter deficiency   . Megaesophagus   . Mixed stress and urge urinary incontinence   . Multinodular thyroid 06/26/2012   Multi nodular goiter. Large nodules in both lobes of the gland.  These nodules fit national criteria for fine needle aspiration  biopsy if not previously assessed.    . OSA on CPAP    cpap, doees not know settings  . Polymyalgia rheumatica (HCC)    on chronic Prednisone 5mg  daily  . RBBB   . S/P dilatation of esophageal stricture     Patient Active Problem List   Diagnosis Date Noted  . Age-related osteoporosis without current pathological fracture 08/31/2017  . Overweight 08/31/2017  . Callus 08/31/2017  . S/P placement of cardiac pacemaker 04/27/2017  . Hyperlipidemia 04/27/2017  . Atrophic vaginitis 04/27/2017  . Estrogen deficiency 04/27/2017  . Bezoar   . Actinic keratoses 07/06/2016  . Wound, open, finger 11/25/2015  . 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 Abnormal chest x-ray 08/18/2015  . Right inguinal hernia 04/08/2015  . Pain of right scapula 04/02/2014  . Osteopenia 11/13/2013  . Urinary incontinence 11/13/2013  . Hyperglycemia 07/17/2013  . Intrinsic sphincter deficiency  05/24/2013  . Mixed incontinence urge and stress 05/24/2013  . Reflux esophagitis 01/28/2013  . Sinoatrial node dysfunction ([place]) 01/05/2013  . Sick sinus syndrome ([place]) 01/05/2013  . Sleep apnea 11/21/2012  . Disorder of bone and cartilage 11/21/2012  . Obesity 11/21/2012  . Edema 11/21/2012  . Personal history of other diseases of circulatory system 11/21/2012  . Achalasia of esophagus 10/10/2012  . Gastric AVM 08/07/2012  . Esophageal dysphagia 06/27/2012  . Multinodular thyroid 06/26/2012  . Megaesophagus 06/20/2012  . Polymyalgia rheumatica ([place]) 06/19/2012  . Mediastinal mass  06/19/2012  . Hammer toe 04/17/2012  . Long term current use of systemic steroids 01/27/2012  . Paroxysmal ventricular tachycardia ([place]) 01/18/2012  . Cardiac pacemaker in situ 01/18/2012  . Nonsustained ventricular tachycardia ([place]) 01/18/2012  . Sprain of wrist 05/10/2011  . Disorder of rotator cuff syndrome of shoulder and allied disorder 04/01/2011  . Bursitis of shoulder 04/01/2011    Past Surgical History:  Procedure Laterality Date  . APPENDECTOMY  1953   w/ removal benign kidney tumor   . BALLOON DILATION  07/24/2012   Procedure: BALLOON DILATION;  Surgeon: Inda Castle, MD;  Location: [facility];  Service: Endoscopy;  Laterality: N/A;  . BALLOON DILATION N/A 12/03/2015   Procedure: BALLOON DILATION;  Surgeon: Mauri Pole, MD;  Location: [facility];  Service: Endoscopy;  Laterality: N/A;  pnuematic balloon  . BALLOON DILATION N/A 03/16/2017   Procedure: BALLOON DILATION;  Surgeon: Mauri Pole, MD;  Location: [facility];  Service: Endoscopy;  Laterality: N/A;  PNUEMATIC BALLOONS  . BOTOX INJECTION  08/07/2012   Procedure: BOTOX INJECTION;  Surgeon: Inda Castle, MD;  Location: [facility];  Service: Endoscopy;  Laterality: N/A;  . BOTOX INJECTION N/A 05/24/2013   Procedure: MACROPLASTIQUE IMPLANT;  Surgeon: Irine Seal, MD;  Location: [facility];  Service: Urology;  Laterality: N/A;  . BOTOX INJECTION  02/25/2014   Procedure: BOTOX INJECTION;  Surgeon: Inda Castle, MD;  Location: [facility];  Service: Endoscopy;;  . CARDIAC PACEMAKER PLACEMENT  06/1999  DR RUTH GREENFIELD AT [facility]  ( LAST PACER CHECK 05-09-2013) for CHB/   END-OF-LIFE GENERATOR CHANGE  2009  . CATARACT EXTRACTION W/ INTRAOCULAR LENS  IMPLANT, BILATERAL  2005  . DILATION AND CURETTAGE OF UTERUS    . ESOPHAGOGASTRODUODENOSCOPY  07/24/2012   Procedure: ESOPHAGOGASTRODUODENOSCOPY (EGD);  Surgeon: Inda Castle, MD;  Location: [facility];  Service: Endoscopy;   Laterality: N/A;  . ESOPHAGOGASTRODUODENOSCOPY  08/07/2012   Procedure: ESOPHAGOGASTRODUODENOSCOPY (EGD);  Surgeon: Inda Castle, MD;  Location: [facility];  Service: Endoscopy;  Laterality: N/A;  . ESOPHAGOGASTRODUODENOSCOPY N/A 02/25/2014   Procedure: ESOPHAGOGASTRODUODENOSCOPY (EGD);  Surgeon: Inda Castle, MD;  Location: [facility];  Service: Endoscopy;  Laterality: N/A;  . ESOPHAGOGASTRODUODENOSCOPY N/A 03/16/2017   Procedure: ESOPHAGOGASTRODUODENOSCOPY (EGD);  Surgeon: Mauri Pole, MD;  Location: [facility] END[facility]COPY;  Service: Endoscopy;  Laterality: N/A;  . ESOPHAGOGASTRODUODENOSCOPY N/A 08/02/2018   Procedure: ESOPHAGOGASTRODUODENOSCOPY (EGD);  Surgeon: Mauri Pole, MD;  Location: [facility];  Service: Endoscopy;  Laterality: N/A;  . ESOPHAGOGASTRODUODENOSCOPY (EGD) WITH ESOPHAGEAL DILATION  06/27/2012   Procedure: ESOPHAGOGASTRODUODENOSCOPY (EGD) WITH ESOPHAGEAL DILATION;  Surgeon: Ladene Artist, MD,FACG;  Location: [facility];  Service: Endoscopy;  Laterality: N/A;  . ESOPHAGOGASTRODUODENOSCOPY (EGD) WITH PROPOFOL N/A 10/09/2015   Procedure: ESOPHAGOGASTRODUODENOSCOPY (EGD) WITH PROPOFOL ( WITH BOTOX);  Surgeon: Milus Banister, MD;  Location: [facility];  Service: Endoscopy;  Laterality: N/A;  .  ESOPHAGOGASTRODUODENOSCOPY (EGD) WITH PROPOFOL N/A 12/03/2015   Procedure: ESOPHAGOGASTRODUODENOSCOPY (EGD) WITH PROPOFOL;  Surgeon: Mauri Pole, MD;  Location: [facility];  Service: Endoscopy;  Laterality: N/A;  . FOREIGN BODY REMOVAL  08/02/2018   Procedure: FOREIGN BODY REMOVAL;  Surgeon: Mauri Pole, MD;  Location: [facility];  Service: Endoscopy;;  . PACEMAKER GENERATOR CHANGE  12/13/2007   at [facility]  . TOE SURGERY  2013   left 3rd toe HAMMERTOE REPAIR  . TONSILLECTOMY  AS CHILD  . TOTAL HIP ARTHROPLASTY Right 2001  . VERICOSE VEIN LIGATION    . VIDEO ASSISTED THORACOSCOPY (VATS)/DECORTICATION  06/23/2012   Procedure: VIDEO ASSISTED THORACOSCOPY  (VATS)/DECORTICATION;  Surgeon: Grace Isaac, MD;  Location: [facility];  Service: Thoracic;  Laterality: Right;  Marland Kitchen VIDEO ASSISTED THORACOSCOPY (VATS)/EMPYEMA     06/23/2012  . VIDEO BRONCHOSCOPY  06/23/2012   Procedure: VIDEO BRONCHOSCOPY;  Surgeon: Grace Isaac, MD;  Location: [facility] OR;  Service: Thoracic;  Laterality: N/A;     OB History   No obstetric history on file.     Family History  Problem Relation Age of Onset  . Arthritis Father   . Heart disease Father   . Cancer Brother        ?spine  . Heart disease Brother   . Lung cancer Brother   . Diabetes Son   . Heart disease Mother        Congestive heart failure  . Heart disease Brother   . Arthritis Brother   . Hypertension Brother   . Colon cancer Neg Hx     Social History   Tobacco Use  . Smoking status: Passive Smoke Exposure - Never Smoker  . Smokeless tobacco: Never Used  . Tobacco comment: Exposure through father only.  Substance Use Topics  . Alcohol use: Yes    Alcohol/week: 3.0 standard drinks    Types: 3 Glasses of wine per week    Comment: 1 drink x 3 days/week  . Drug use: No    Home Medications Prior to Admission medications   Medication Sig Start Date End Date Taking? Authorizing Provider  acetaminophen (TYLENOL) 500 MG tablet Take 1,000 mg by mouth. Once or twice a day as needed    [provider]  AMBULATORY NON FORMULARY MEDICATION Medication Name: ultra zyme- Take 1 capsule twice daily    [provider]  calcium carbonate (TUMS EX) 750 MG chewable tablet Chew 1 tablet by mouth as needed for heartburn.    [provider]  Calcium-Phosphorus-Vitamin D (CALCIUM/VITAMIN D3/ADULT GUMMY PO) Take by mouth. 500 mg calcium, 1000 iu vitamin D3, Phosphorous 230mg . 3 daily    [provider]  Collagen-Boron-Hyaluronic Acid ([employer] JOINT HEALTH TRIPLE ACTION PO) Take 1 tablet by mouth daily.    [provider]  famotidine (PEPCID) 20 MG tablet Take 20 mg by  mouth as needed for heartburn or indigestion.    [provider]  furosemide (LASIX) 20 MG tablet Take 20 mg by mouth daily.    [provider]  KLOR-CON M20 20 MEQ tablet TAKE 1 TABLET BY MOUTH EVERY DAY 10/09/19   [name], MD  Multiple Vitamins-Minerals (HM MULTIVITAMIN ADULT GUMMY PO) Take by mouth. 3 daily    [provider]  Multiple Vitamins-Minerals (OCUVITE EYE + MULTI PO) Take by mouth daily.    [provider]  predniSONE (DELTASONE) 5 MG tablet Take 1 tablet (5 mg total) by mouth daily. 10/10/19   Veleta Miners  L, MD    Allergies    Actonel [risedronate sodium] and Ivp dye [iodinated diagnostic agents]  Review of Systems   Review of Systems  Respiratory: Positive for cough and shortness of breath.   Cardiovascular: Positive for leg swelling.  All other systems reviewed and are negative.   Physical Exam Updated Vital Signs BP (!) 172/87 (BP Location: Right Arm)   Pulse 90   Temp (!) 97.4 F (36.3 C) (Oral)   Resp 17   Ht 5' (1.524 m)   Wt 59 kg   SpO2 96%   BMI 25.39 kg/m   Physical Exam Vitals and nursing note reviewed.  Constitutional:      Appearance: She is well-developed.  HENT:     Head: Normocephalic and atraumatic.  Eyes:     Conjunctiva/sclera: Conjunctivae normal.     Pupils: Pupils are equal, round, and reactive to light.  Cardiovascular:     Rate and Rhythm: Normal rate and regular rhythm.     Heart sounds: Normal heart sounds.  Pulmonary:     Effort: Pulmonary effort is normal.     Breath sounds: Normal breath sounds. No stridor. No wheezing.     Comments: Gurgling cough when lying flat, no production of mucous Abdominal:     General: Bowel sounds are normal.     Palpations: Abdomen is soft.  Musculoskeletal:        General: Normal range of motion.     Cervical back: Normal range of motion.     Comments: 2+ pitting edema of feet and ankles up to mid-shin  Skin:    General: Skin is warm and dry.    Neurological:     Mental Status: She is alert and oriented to person, place, and time.     ED Results / Procedures / Treatments   Labs (all labs ordered are listed, but only abnormal results are displayed) Labs Reviewed  BASIC METABOLIC PANEL - Abnormal; Notable for the following components:      Result Value   Potassium 3.4 (*)    Glucose, Bld 117 (*)    All other components within normal limits  CBC - Abnormal; Notable for the following components:   MCV 101.3 (*)    RDW 15.9 (*)    Platelets 118 (*)    All other components within normal limits  BRAIN NATRIURETIC PEPTIDE  TROPONIN I (HIGH SENSITIVITY)  TROPONIN I (HIGH SENSITIVITY)    EKG EKG Interpretation  Date/Time:  Thursday October 18 2019 00:32:21 EDT Ventricular Rate:  92 PR Interval:  198 QRS Duration: 138 QT Interval:  370 QTC Calculation: 457 R Axis:   101 Text Interpretation: Sinus rhythm with Fusion complexes Possible Left atrial enlargement Right bundle branch block Abnormal ECG No significant change since last tracing Confirmed by Merrily Pew [id number]) on 10/18/2019 3:54:43 AM   Radiology DG Chest 2 View  Result Date: 10/18/2019 CLINICAL DATA:  Cough and shortness of breath. EXAM: CHEST - 2 VIEW COMPARISON:  October 09, 2015 FINDINGS: There is a dual lead AICD. The lungs are hyperinflated. Mild chronic appearing increased lung markings are seen without evidence of acute infiltrate. There is mild blunting of the bilateral costophrenic angles. No pneumothorax is seen. The heart size and mediastinal contours are within normal limits. The there is a large hiatal hernia with a markedly dilated esophagus. This is stable in appearance when compared to the prior exam. Degenerative changes are noted throughout the thoracic spine. IMPRESSION: 1. Chronic appearing increased lung  markings without evidence of an acute infiltrate. 2. Large hiatal hernia with a markedly dilated esophagus, unchanged in appearance when compared to  the prior study dated October 09, 2015. 3. Small bilateral pleural effusions. Electronically Signed   By: Virgina Norfolk M.D.   On: 10/18/2019 01:16    Procedures Procedures (including critical care time)  Medications Ordered in ED Medications  sodium chloride flush (NS) 0.9 % injection 3 mL (has no administration in time range)  furosemide (LASIX) injection 40 mg (40 mg Intravenous Given 10/18/19 0426)    ED Course  I have reviewed the triage vital signs and the nursing notes.  Pertinent labs & imaging results that were available during my care of the patient were reviewed by me and considered in my medical decision making (see chart for details).    MDM Rules/Calculators/A&P    86 year old female presenting to the ED with cough and shortness of breath.  Has been having issues for a few days, no fever or chills.  No sick contacts.   Daughter also reports some ongoing issues with peripheral edema that seems to be worsening.  She was initially on PRN Lasix, now taking daily.  No increase in urination.  Unsure about weight change.  Patient does report some lightheadedness and generalized weakness when trying to walk.  Symptoms are worse with lying flat.  On exam she is afebrile and nontoxic, she does appear frail and fluid overloaded.  She is lying flat and does have a gurgling type cough.  She does have 2+ peripheral edema of lower legs.  Clinically this is concerning for CHF.  Labs thus far grossly reassuring.  Troponin negative x2.  Will add BNP.  Given dose of IV Lasix.  4:55 AM Patient reassessed.  She actually appears a little worse now while sitting upright.  She is coughing up white, frothy sputum.  Her oxygen saturations are marginal on room air at around 92 to 93%.  Her BNP is mildly elevated.  Daughter at bedside does have concerns given they have been trying to manage this as an outpatient and have not noticed any improvement.  She is also in an assisted living facility and does not  have anyone to monitor her at night which is when she has the most problems.  I do feel it would be reasonable to admit for diuresis, echocardiogram as it does not appear this has been done.  Patient and daughter are agreeable.  Discussed with hospitalist, Dr. Marlowe Sax-- will admit for ongoing care.  Final Clinical Impression(s) / ED Diagnoses Final diagnoses:  Peripheral edema    Rx / DC Orders ED Discharge Orders    None       Larene Pickett, PA-C 10/18/19 [id number]    Merrily Pew, MD 10/18/19 [phone]

## 2019-10-18 NOTE — ED Triage Notes (Signed)
The pt reports that she has had a cough since yesterday no pain  Hx of xhf

## 2019-10-18 NOTE — ED Notes (Signed)
Interrogated pt's pacemaker 

## 2019-10-19 LAB — CBC
HCT: 41.5 % (ref 36.0–46.0)
Hemoglobin: 13.1 g/dL (ref 12.0–15.0)
MCH: 31.6 pg (ref 26.0–34.0)
MCHC: 31.6 g/dL (ref 30.0–36.0)
MCV: 100.2 fL — ABNORMAL HIGH (ref 80.0–100.0)
Platelets: 98 10*3/uL — ABNORMAL LOW (ref 150–400)
RBC: 4.14 MIL/uL (ref 3.87–5.11)
RDW: 15.9 % — ABNORMAL HIGH (ref 11.5–15.5)
WBC: 11.1 10*3/uL — ABNORMAL HIGH (ref 4.0–10.5)
nRBC: 0 % (ref 0.0–0.2)

## 2019-10-19 LAB — BASIC METABOLIC PANEL
Anion gap: 15 (ref 5–15)
BUN: 20 mg/dL (ref 8–23)
CO2: 30 mmol/L (ref 22–32)
Calcium: 9.5 mg/dL (ref 8.9–10.3)
Chloride: 99 mmol/L (ref 98–111)
Creatinine, Ser: 0.67 mg/dL (ref 0.44–1.00)
GFR calc Af Amer: >60 mL/min (ref >60)
GFR calc non Af Amer: >60 mL/min (ref >60)
Glucose, Bld: 124 mg/dL — ABNORMAL HIGH (ref 70–99)
Potassium: 3.1 mmol/L — ABNORMAL LOW (ref 3.5–5.1)
Sodium: 144 mmol/L (ref 135–145)

## 2019-10-19 MED ORDER — POTASSIUM CHLORIDE 20 MEQ PO PACK
40.0000 meq | PACK | Freq: Every day | ORAL | Status: DC
  Administered 2019-10-19: 40 meq via ORAL
  Filled 2019-10-19 (×2): qty 2

## 2019-10-19 NOTE — Progress Notes (Signed)
Pulmonary toilette started on patient. Patient is using the IS and the flutter valve alternately and walked down the hall leading to 5N. Patient sounds better at this time. SCD placed on the bed and educate patient that she will have it placed at night while in bed. Will continue to monitor patient.

## 2019-10-19 NOTE — Evaluation (Signed)
Occupational Therapy Evaluation Patient Details Name: [name] MRN: [id number] DOB: June 12, 1929 Today's Date: 10/19/2019    History of Present Illness Pt is a 86 yo female admitted with  CHF exacerbatinon and B pleural effusions.  Pt with a h/o HTN, CHB with PM, PMR, OSA with cpap.  Pt lives in [facility] with her husband in independent living.  Reports of decreased memory and cognition over the last year.   Clinical Impression   Pt admitted with the above diagnosis and has the deficits outlined below. Pt would benefit from cont OT to increase pt's safety on her feet with all adls and increase her ability to complete all adls with less assist so she can d/c safely home with her husband to [facility] with assistance.  Pt is a fall risk and prefers to not use a walker.  Spoke with pt at length about the need to use a walker to avoid future hospitalizations.  Pt has agreed use a walker and needs to at all times.  Pt walked to bathroom on room air with O2 sats dropping to 88%.  Sats immediately went up to 92% when O2 replaced.      Follow Up Recommendations  Home health OT;Supervision/Assistance - 24 hour    Equipment Recommendations  3 in 1 bedside commode    Recommendations for Other Services       Precautions / Restrictions Precautions Precautions: Fall Precaution Comments: no recent falls but very unsteady and does not like to use walker. Restrictions Weight Bearing Restrictions: No      Mobility Bed Mobility Overal bed mobility: Needs Assistance Bed Mobility: Supine to Sit     Supine to sit: Min assist     General bed mobility comments: Pt required assist to push to full sitting position from side sit.  Transfers Overall transfer level: Needs assistance   Transfers: Sit to/from Stand;Stand Pivot Transfers Sit to Stand: Min assist Stand pivot transfers: Min assist       General transfer comment: used hha but provided walker after session.     Balance Overall balance assessment: Needs assistance Sitting-balance support: Feet supported Sitting balance-Leahy Scale: Fair     Standing balance support: Bilateral upper extremity supported;During functional activity Standing balance-Leahy Scale: Poor Standing balance comment: Pt heavily relies on outside support from walker or second person when on her feet.                           ADL either performed or assessed with clinical judgement   ADL Overall ADL's : Needs assistance/impaired Eating/Feeding: Set up;Sitting   Grooming: Wash/dry hands;Wash/dry face;Minimal assistance;Standing;Cueing for safety Grooming Details (indicate cue type and reason): Pt stood at sink for very short time with min assist for balance. Upper Body Bathing: Set up;Sitting;Cueing for safety   Lower Body Bathing: Moderate assistance;Sit to/from stand;Cueing for safety   Upper Body Dressing : Minimal assistance;Sitting   Lower Body Dressing: Moderate assistance;Sit to/from stand;Cueing for safety   Toilet Transfer: Moderate assistance;Ambulation;Regular Toilet;Grab bars Toilet Transfer Details (indicate cue type and reason): Pt walked to bathroom with HHA bc no walker in room. Walker later provided. Toileting- Clothing Manipulation and Hygiene: Supervision/safety;Sitting/lateral lean       Functional mobility during ADLs: Moderate assistance;Cueing for safety General ADL Comments: Pt limited when in standing because of poor balance.     Vision Baseline Vision/History: Wears glasses Wears Glasses: Reading only Patient Visual Report: No change  from baseline Vision Assessment?: No apparent visual deficits     Perception Perception Perception Tested?: No   Praxis Praxis Praxis tested?: Within functional limits    Pertinent Vitals/Pain Pain Assessment: Faces Faces Pain Scale: Hurts little more Pain Location: back and tail bone. Pain Descriptors / Indicators: Sore Pain  Intervention(s): Monitored during session;Repositioned     Hand Dominance Right   Extremity/Trunk Assessment Upper Extremity Assessment Upper Extremity Assessment: Generalized weakness   Lower Extremity Assessment Lower Extremity Assessment: Defer to PT evaluation   Cervical / Trunk Assessment Cervical / Trunk Assessment: Kyphotic   Communication Communication Communication: No difficulties   Cognition Arousal/Alertness: Awake/alert Behavior During Therapy: WFL for tasks assessed/performed Overall Cognitive Status: Impaired/Different from baseline Area of Impairment: Orientation;Memory;Awareness                 Orientation Level: Place   Memory: Decreased recall of precautions;Decreased short-term memory     Awareness: Emergent   General Comments: Pt knew she was in hospital but could not recall the name.     General Comments  Pt with mild swelling.  Feet elevated.    Exercises     Shoulder Instructions      Home Living Family/patient expects to be discharged to:: Private residence Living Arrangements: Spouse/significant other Available Help at Discharge: Family;Available 24 hours/day Type of Home: Independent living facility Home Access: Level entry     Home Layout: One level     Bathroom Shower/Tub: Tub/shower unit;Curtain   Biochemist, clinical: Standard Bathroom Accessibility: Yes How Accessible: Accessible via walker Home Equipment: [place] - 2 wheels;Cane - single point;Shower seat;Grab bars - toilet          Prior Functioning/Environment Level of Independence: Needs assistance  Gait / Transfers Assistance Needed: Has been a household ambulator holding onto furniture more than using walker.  Family would like her to use walker.  ADL's / Homemaking Assistance Needed: meals provided and homemaking provided.  Pt has had a slow decline with adls in last few months. Was fully independent until recently.   Comments: Husband there but he has trouble  caring for himself as well so family and hired help take rotations so someone is always with the pt and her husband.        OT Problem List: Decreased strength;Decreased activity tolerance;Impaired balance (sitting and/or standing);Decreased cognition;Decreased safety awareness;Decreased knowledge of use of DME or AE;Cardiopulmonary status limiting activity      OT Treatment/Interventions: Self-care/ADL training;Energy conservation;Therapeutic activities;Balance training    OT Goals(Current goals can be found in the care plan section) Acute Rehab OT Goals Patient Stated Goal: to walk better OT Goal Formulation: With patient/family Time For Goal Achievement: 11/02/19 Potential to Achieve Goals: Good ADL Goals Pt Will Perform Lower Body Bathing: with supervision;sit to/from stand Pt Will Perform Lower Body Dressing: with supervision;sit to/from stand Pt Will Perform Tub/Shower Transfer: Tub transfer;with min guard assist;shower seat;rolling walker Additional ADL Goal #1: Pt will walk to bathroom with walker and complete toileting on 3:1 over commode with supervision. Additional ADL Goal #2: Pt will state 3 reasons why it is imparitive to use her walker at all times when on her feet with no VCs.  OT Frequency: Min 2X/week   Barriers to D/C:    Pt has 24/7 assist        Co-evaluation              AM-PAC OT "6 Clicks" Daily Activity     Outcome Measure Help from another person  eating meals?: A Little Help from another person taking care of personal grooming?: A Little Help from another person toileting, which includes using toliet, bedpan, or urinal?: A Little Help from another person bathing (including washing, rinsing, drying)?: A Little Help from another person to put on and taking off regular upper body clothing?: A Little Help from another person to put on and taking off regular lower body clothing?: A Little 6 Click Score: 18   End of Session Equipment Utilized During  Treatment: Rolling walker;Oxygen Nurse Communication: Mobility status  Activity Tolerance: Patient limited by fatigue Patient left: in chair;with call bell/phone within reach;with chair alarm set;with family/visitor present  OT Visit Diagnosis: Unsteadiness on feet (R26.81)                Time: [id number] OT Time Calculation (min): 35 min Charges:  OT General Charges $OT Visit: 1 Visit OT Evaluation $OT Eval Moderate Complexity: 1 Mod OT Treatments $Self Care/Home Management : 8-22 mins   Glenford Peers 10/19/2019, 10:12 AM

## 2019-10-19 NOTE — Progress Notes (Signed)
Progress Note    [name]  [id number] DOB: [birth year]  DOA: 10/18/2019 PCP: [name], MD    Brief Narrative:   Medical records reviewed and are as summarized below:  [name] is an 86 y.o. female with medical history significant of HTN, CHB s/p PM, PMR, OSA on CPAP, and achalasia presents with complaints of cough and shortness of breath.  History obtained from the patient as well as her daughter.  At baseline patient had been in independent living at friend's home, but over the last year daughter notes that her memory has declined and is not necessarily always oriented to time and place.  Patient reports that the symptoms have been present for 1 week, but daughter notes that it has been longer than that.  She is coughing up frothy white sputum and at times just spitting large amounts up.  Notes that she has had associated symptoms of poor appetite, orthopnea sleeping in chair or propped up on pillows, and leg swelling present since November 2020.  Her PCP had put her on low-dose furosemide 10 mg daily, but they had not noticed any change in symptoms.    Assessment/Plan:   Principal Problem:   CHF (congestive heart failure) (HCC) Active Problems:   Polymyalgia rheumatica (HCC)   Bilateral pleural effusion   Esophageal dysphagia   Achalasia of esophagus   S/P placement of cardiac pacemaker   Pressure injury of skin   Thrombocytopenia (HCC)   Hypokalemia   Bilateral pleural effusions secondary to suspected diastolic CHF: Acute.  - Patient presents with shortness of breath and cough of white sputum production.  Reports orthopnea.  - BNP mildly elevated at 122.9 and chest x-ray concerning for bilateral pleural effusions.  Review of records notes patient had a loculated right pleural effusion with possible concern bronchopleural fistula in 2014. -Strict I&Os and daily weights -Elevate lower extremities -Flutter valve and incentive spirometry -TSH within  normal range -Lasix 40 mg IV twice daily -echocardiogram: Left ventricular ejection fraction, by estimation, is 55 to 60%. The left ventricle has normal function. The left ventricle has no regional wall motion abnormalities. Left ventricular diastolic parameters are  consistent with Grade I diastolic  dysfunction (impaired relaxation). Elevated left atrial pressure. -PT/OT consult    Acute respiratory failure with hypoxia -Wean O2 to room air as patient does not wear oxygen at home -Does wear CPAP at night  Hypokalemia:  -Replete p.o.  Polymyalgia rheumatica on chronic steroids: Patient on prednisone 5 mg daily.  Last sed rate noted to be 33 on 3/24. -Continue prednisone  Thrombocytopenia: Acute on chronic.   -We will change to SCDs  CHB s/p PM -Pacemaker reported to have been interrogated: Have been unable to locate results -Telemetry shows PVCs  GERD, Achalasia, Esophageal dysphagia: Patient with history of reflux and achalasia requiring previous esophageal dilation.  At baseline patient is supposed to be on pured diet but does not follow this at home. -Aspiration precautions -Change Pepcid to schedule -Soft diet  -Pulmonary toilet  Debility -PT/OT  Pressure ulcer sacrum: stage 1 -Low air loss mattress replacement   Family Communication/Anticipated D/C date and plan/Code Status   DVT prophylaxis: SCDs Code Status: Full Code (patient has paperwork in chart stating DNR so will need to confirm with daughter is here this afternoon) Family Communication: Daughter at bedside this a.m. and another daughter on the phone Disposition Plan: Patient needs continued diuresis via IV   Medical Consultants:    None.  Subjective:   Patient states her legs are less swollen today  Objective:    Vitals:   10/18/19 1700 10/18/19 1821 10/19/19 0113 10/19/19 0624  BP: 107/86 (!) 144/85 130/67 138/84  Pulse: 83  85 84  Resp: (!) 23 20 17 17   Temp:  98.6 F (37 C)  98.7 F (37.1 C)   TempSrc:  Oral Oral   SpO2: 96% 97% 94% 98%  Weight:      Height:        Intake/Output Summary (Last 24 hours) at 10/19/2019 1146 Last data filed at 10/19/2019 0300 Gross per 24 hour  Intake 240 ml  Output --  Net 240 ml   Filed Weights   10/18/19 0042  Weight: 59 kg    Exam: Patient appears frail and fatigued Regular rate and rhythm with occasional PVCs Alert Mild increased work of breathing, wearing O2, crackles at bases Positive lower extremity edema but improving as skin appears wrinkled around the ankles   Data Reviewed:   I have personally reviewed following labs and imaging studies:  Labs: Labs show the following:   Basic Metabolic Panel: Recent Labs  Lab 10/18/19 0047 10/19/19 0123  NA 143 144  K 3.4* 3.1*  CL 103 99  CO2 29 30  GLUCOSE 117* 124*  BUN 22 20  CREATININE 0.66 0.67  CALCIUM 9.6 9.5   GFR Estimated Creatinine Clearance: 39.1 mL/min (by C-G formula based on SCr of 0.67 mg/dL). Liver Function Tests: No results for input(s): AST, ALT, ALKPHOS, BILITOT, PROT, ALBUMIN in the last 168 hours. No results for input(s): LIPASE, AMYLASE in the last 168 hours. No results for input(s): AMMONIA in the last 168 hours. Coagulation profile No results for input(s): INR, PROTIME in the last 168 hours.  CBC: Recent Labs  Lab 10/18/19 0047 10/19/19 0123  WBC 6.5 11.1*  HGB 12.6 13.1  HCT 40.2 41.5  MCV 101.3* 100.2*  PLT 118* 98*   Cardiac Enzymes: No results for input(s): CKTOTAL, CKMB, CKMBINDEX, TROPONINI in the last 168 hours. BNP (last 3 results) No results for input(s): PROBNP in the last 8760 hours. CBG: No results for input(s): GLUCAP in the last 168 hours. D-Dimer: No results for input(s): DDIMER in the last 72 hours. Hgb A1c: No results for input(s): HGBA1C in the last 72 hours. Lipid Profile: No results for input(s): CHOL, HDL, LDLCALC, TRIG, CHOLHDL, LDLDIRECT in the last 72 hours. Thyroid function  studies: Recent Labs    10/18/19 1211  TSH 0.517   Anemia work up: No results for input(s): VITAMINB12, FOLATE, FERRITIN, TIBC, IRON, RETICCTPCT in the last 72 hours. Sepsis Labs: Recent Labs  Lab 10/18/19 0047 10/19/19 0123  WBC 6.5 11.1*    Microbiology Recent Results (from the past 240 hour(s))  SARS CORONAVIRUS 2 (TAT 6-24 HRS) Nasopharyngeal Nasopharyngeal Swab     Status: None   Collection Time: 10/18/19  5:01 AM   Specimen: Nasopharyngeal Swab  Result Value Ref Range Status   SARS Coronavirus 2 NEGATIVE NEGATIVE Final    Comment: (NOTE) SARS-CoV-2 target nucleic acids are NOT DETECTED. The SARS-CoV-2 RNA is generally detectable in upper and lower respiratory specimens during the acute phase of infection. Negative results do not preclude SARS-CoV-2 infection, do not rule out co-infections with other pathogens, and should not be used as the sole basis for treatment or other patient management decisions. Negative results must be combined with clinical observations, patient history, and epidemiological information. The expected result is Negative. Fact Sheet for Patients:  SugarRoll.be Fact Sheet for Healthcare Providers: https://www.woods-mathews.com/ This test is not yet approved or cleared by the [place] FDA and  has been authorized for detection and/or diagnosis of SARS-CoV-2 by FDA under an Emergency Use Authorization (EUA). This EUA will remain  in effect (meaning this test can be used) for the duration of the COVID-19 declaration under Section 56 4(b)(1) of the Act, 21 U.S.C. section 360bbb-3(b)(1), unless the authorization is terminated or revoked sooner. Performed at [facility], [place] [address], [place], [place] [address]     Procedures and diagnostic studies:  DG Chest 2 View  Result Date: 10/18/2019 CLINICAL DATA:  Cough and shortness of breath. EXAM: CHEST - 2 VIEW COMPARISON:  October 09, 2015  FINDINGS: There is a dual lead AICD. The lungs are hyperinflated. Mild chronic appearing increased lung markings are seen without evidence of acute infiltrate. There is mild blunting of the bilateral costophrenic angles. No pneumothorax is seen. The heart size and mediastinal contours are within normal limits. The there is a large hiatal hernia with a markedly dilated esophagus. This is stable in appearance when compared to the prior exam. Degenerative changes are noted throughout the thoracic spine. IMPRESSION: 1. Chronic appearing increased lung markings without evidence of an acute infiltrate. 2. Large hiatal hernia with a markedly dilated esophagus, unchanged in appearance when compared to the prior study dated October 09, 2015. 3. Small bilateral pleural effusions. Electronically Signed   By: Virgina Norfolk M.D.   On: 10/18/2019 01:16   ECHOCARDIOGRAM COMPLETE  Result Date: 10/18/2019    ECHOCARDIOGRAM REPORT   Patient Name:   [name] Date of Exam: 10/18/2019 Medical Rec #:  [id number]          Height:       60.0 in Accession #:    [id number]         Weight:       130.0 lb Date of Birth:  11-Dec-1928           BSA:          1.554 m Patient Age:    42 years           BP:           116/61 mmHg Patient Gender: F                  HR:           69 bpm. Exam Location:  Inpatient Procedure: 2D Echo, Color Doppler and Cardiac Doppler Indications:    [id number] Acute diastolic (congestive) heart failure  History:        Patient has prior history of Echocardiogram examinations, most                 recent 06/27/2012. CHF, Pacemaker; Risk Factors:Sleep Apnea and                 Hypertension. Prior echo performed at [facility].  Sonographer:    Raquel Sarna Senior RDCS Referring Phys: [phone] RONDELL A SMITH  Sonographer Comments: Very technically difficult due to patient body habitus. IMPRESSIONS  1. Left ventricular ejection fraction, by estimation, is 55 to 60%. The left ventricle has normal function. The left ventricle has no  regional wall motion abnormalities. Left ventricular diastolic parameters are consistent with Grade I diastolic dysfunction (impaired relaxation). Elevated left atrial pressure.  2. Right ventricular systolic function is normal. The right ventricular size is normal. There is mildly elevated pulmonary artery systolic pressure. The  estimated right ventricular systolic pressure is 99991111 mmHg.  3. Right atrial size was mildly dilated.  4. The mitral valve is normal in structure. No evidence of mitral valve regurgitation. No evidence of mitral stenosis.  5. The aortic valve was not well visualized. Aortic valve regurgitation is not visualized. Mild to moderate aortic valve sclerosis/calcification is present, without any evidence of aortic stenosis.  6. The inferior vena cava is normal in size with greater than 50% respiratory variability, suggesting right atrial pressure of 3 mmHg. FINDINGS  Left Ventricle: Left ventricular ejection fraction, by estimation, is 55 to 60%. The left ventricle has normal function. The left ventricle has no regional wall motion abnormalities. The left ventricular internal cavity size was normal in size. There is  no left ventricular hypertrophy. Left ventricular diastolic parameters are consistent with Grade I diastolic dysfunction (impaired relaxation). Elevated left atrial pressure. Right Ventricle: The right ventricular size is normal. No increase in right ventricular wall thickness. Right ventricular systolic function is normal. There is mildly elevated pulmonary artery systolic pressure. The tricuspid regurgitant velocity is 2.74  m/s, and with an assumed right atrial pressure of 8 mmHg, the estimated right ventricular systolic pressure is [id number] mmHg. Left Atrium: Left atrial size was normal in size. Right Atrium: Right atrial size was mildly dilated. Pericardium: There is no evidence of pericardial effusion. Mitral Valve: The mitral valve is normal in structure. Normal mobility of the  mitral valve leaflets. No evidence of mitral valve regurgitation. No evidence of mitral valve stenosis. Tricuspid Valve: The tricuspid valve is normal in structure. Tricuspid valve regurgitation is not demonstrated. No evidence of tricuspid stenosis. Aortic Valve: The aortic valve was not well visualized. Aortic valve regurgitation is not visualized. Mild to moderate aortic valve sclerosis/calcification is present, without any evidence of aortic stenosis. Pulmonic Valve: The pulmonic valve was normal in structure. Pulmonic valve regurgitation is not visualized. No evidence of pulmonic stenosis. Aorta: The aortic root is normal in size and structure. Venous: The inferior vena cava is normal in size with greater than 50% respiratory variability, suggesting right atrial pressure of 3 mmHg. IAS/Shunts: No atrial level shunt detected by color flow Doppler. Additional Comments: A pacer wire is visualized.  LEFT VENTRICLE PLAX 2D LVIDd:         2.31 cm  Diastology LVIDs:         1.69 cm  LV e' lateral:   3.81 cm/s LV PW:         1.14 cm  LV E/e' lateral: 25.2 LV IVS:        1.01 cm  LV e' medial:    3.92 cm/s LVOT diam:     2.00 cm  LV E/e' medial:  24.5 LV SV:         61 LV SV Index:   39 LVOT Area:     3.14 cm  RIGHT VENTRICLE RV S prime:     18.60 cm/s TAPSE (M-mode): 1.8 cm LEFT ATRIUM           Index       RIGHT ATRIUM           Index LA diam:      1.80 cm 1.16 cm/m  RA Area:     18.90 cm LA Vol (A4C): 21.9 ml 14.09 ml/m RA Volume:   50.40 ml  32.43 ml/m  AORTIC VALVE LVOT Vmax:   90.70 cm/s LVOT Vmean:  57.700 cm/s LVOT VTI:    0.193 m  AORTA Ao  Root diam: 3.20 cm MITRAL VALVE                TRICUSPID VALVE MV Area (PHT): 2.91 cm     TR Peak grad:   30.0 mmHg MV Decel Time: 261 msec     TR Vmax:        274.00 cm/s MV E velocity: 96.10 cm/s MV A velocity: 168.00 cm/s  SHUNTS MV E/A ratio:  0.57         Systemic VTI:  0.19 m                             Systemic Diam: 2.00 cm Candee Furbish MD Electronically signed  by Candee Furbish MD Signature Date/Time: 10/18/2019/1:53:50 PM    Final     Medications:   . enoxaparin (LOVENOX) injection  40 mg Subcutaneous Q24H  . famotidine  20 mg Oral Daily  . furosemide  40 mg Intravenous BID  . potassium chloride  40 mEq Oral Daily  . predniSONE  5 mg Oral Daily  . sodium chloride flush  3 mL Intravenous Once  . sodium chloride flush  3 mL Intravenous Q12H   Continuous Infusions: . sodium chloride       LOS: 1 day   Geradine Girt  [facility]   How to contact the [facility] Attending or Consulting provider [place] or covering provider during after hours [place], for this patient?  1. Check the care team in [facility] and look for a) attending/consulting [facility] provider listed and b) the [facility] team listed 2. Log into [email or web address] and use [facility]'s universal password to access. If you do not have the password, please contact the hospital operator. 3. Locate the [facility] provider you are looking for under [facility] and page to a number that you can be directly reached. 4. If you still have difficulty reaching the provider, please page the [facility] (Director on Call) for the Hospitalists listed on amion for assistance.  10/19/2019, 11:46 AM

## 2019-10-19 NOTE — TOC Initial Note (Addendum)
Transition of Care [facility]) - Initial/Assessment Note    Patient Details  Name: [name] MRN: [id number] Date of Birth: 08/18/1928  Transition of Care [facility]) CM/SW Contact:    Marilu Favre, RN Phone Number: 10/19/2019, 12:10 PM  Clinical Narrative:                  Talked to patient and daughter Lelon Frohlich at bedside. Patient from [place] [facility]. Patient lives with husband , family has arranged 24/7 assistance.   OT recommends HHOT and 24 hr supervision. Await PT eval.   Patient already receiving home health PT through [place].  Patient and Lelon Frohlich would like HHPT and OT with [facility].   Once PT evaluation completed will ask for orders and contact [facility]. Patient has a walker at home already. Needs 3 in1 will order. Patient does not have home oxygen. Saturations dropped while working with OT. If home oxygen needed will need oxygen saturation ambulation note and MD order.   [facility] fax number is [phone]  Will continue to follow.  Expected Discharge Plan: [place] Barriers to Discharge: Continued Medical Work up   Patient Goals and CMS Choice Patient states their goals for this hospitalization and ongoing recovery are:: to return to independent Living , with [facility] for HHPT/OT CMS Medicare.gov Compare Post Acute Care list provided to:: Patient Choice offered to / list presented to : Patient, Adult Children  Expected Discharge Plan and Services Expected Discharge Plan: [place] Acute Care Choice: Home Health, Durable Medical Equipment Living arrangements for the past 2 months: Apartment                 DME Arranged: 3-N-1 DME Agency: [employer] Date DME Agency Contacted: 10/19/19 Time DME Agency Contacted: 1209 Representative spoke with at DME Agency: [place]: PT, OT          Prior Living Arrangements/Services Living arrangements for the past 2 months: Apartment Lives with:: Spouse Patient  language and need for interpreter reviewed:: Yes Do you feel safe going back to the place where you live?: Yes      Need for Family Participation in Patient Care: Yes (Comment) Care giver support system in place?: Yes (comment) Current home services: DME Criminal Activity/Legal Involvement Pertinent to Current Situation/Hospitalization: No - Comment as needed  Activities of Daily Living   ADL Screening (condition at time of admission) Patient's cognitive ability adequate to safely complete daily activities?: No Is the patient deaf or have difficulty hearing?: No Does the patient have difficulty seeing, even when wearing glasses/contacts?: No Does the patient have difficulty concentrating, remembering, or making decisions?: Yes Patient able to express need for assistance with ADLs?: Yes Does the patient have difficulty dressing or bathing?: Yes Independently performs ADLs?: No Communication: Independent Dressing (OT): Dependent Is this a change from baseline?: Pre-admission baseline Does the patient have difficulty walking or climbing stairs?: Yes Weakness of Legs: Both Weakness of Arms/Hands: Both  Permission Sought/Granted   Permission granted to share information with : Yes, Verbal Permission Granted  Share Information with NAME: Lelon Frohlich daughter           Emotional Assessment Appearance:: Appears stated age Attitude/Demeanor/Rapport: Engaged Affect (typically observed): Accepting Orientation: : Oriented to Self, Oriented to Place Alcohol / Substance Use: Not Applicable Psych Involvement: No (comment)  Admission diagnosis:  CHF (congestive heart failure) ([place]) [I50.9] Peripheral edema [R60.9] Patient Active Problem List  Diagnosis Date Noted  . CHF (congestive heart failure) ([place]) 10/18/2019  . Pressure injury of skin 10/18/2019  . Thrombocytopenia ([place]) 10/18/2019  . Hypokalemia 10/18/2019  . Age-related osteoporosis without current pathological fracture 08/31/2017  .  Overweight 08/31/2017  . Callus 08/31/2017  . S/P placement of cardiac pacemaker 04/27/2017  . Hyperlipidemia 04/27/2017  . Atrophic vaginitis 04/27/2017  . Estrogen deficiency 04/27/2017  . Bezoar   . Actinic keratoses 07/06/2016  . Wound, open, finger 11/25/2015  . 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 Abnormal chest x-ray 08/18/2015  . Right inguinal hernia 04/08/2015  . Pain of right scapula 04/02/2014  . Osteopenia 11/13/2013  . Urinary incontinence 11/13/2013  . Hyperglycemia 07/17/2013  . Intrinsic sphincter deficiency 05/24/2013  . Mixed incontinence urge and stress 05/24/2013  . Reflux esophagitis 01/28/2013  . Sinoatrial node dysfunction ([place]) 01/05/2013  . Sick sinus syndrome ([place]) 01/05/2013  . Sleep apnea 11/21/2012  . Disorder of bone and cartilage 11/21/2012  . Obesity 11/21/2012  . Edema 11/21/2012  . Personal history of other diseases of circulatory system 11/21/2012  . Achalasia of esophagus 10/10/2012  . Gastric AVM 08/07/2012  . Esophageal dysphagia 06/27/2012  . Multinodular thyroid 06/26/2012  . Megaesophagus 06/20/2012  . Polymyalgia rheumatica ([place]) 06/19/2012  . Bilateral pleural effusion 06/19/2012  . Mediastinal mass 06/19/2012  . Hammer toe 04/17/2012  . Long term current use of systemic steroids 01/27/2012  . Paroxysmal ventricular tachycardia ([place]) 01/18/2012  . Cardiac pacemaker in situ 01/18/2012  . Nonsustained ventricular tachycardia ([place]) 01/18/2012  . Sprain of wrist 05/10/2011  . Disorder of rotator cuff syndrome of shoulder and allied disorder 04/01/2011  . Bursitis of shoulder 04/01/2011   PCP:  [name], MD Pharmacy:   [id number] [address] [place], [place] [address] Phone: [phone] Fax: [phone]  [facility] [place], [place] [address] [place] [address] Phone: [phone] Fax: [phone]  [facility]  [place], [place] [address] Phone: [phone] Fax: [phone]     Social Determinants of Health (SDOH) Interventions    Readmission Risk Interventions No flowsheet data found.

## 2019-10-19 NOTE — Social Work (Addendum)
11:47am- CSW received a call from [place], pt is from [place]. They use [facility] services in their ILF apartments. CSW attempted to call pt husband and pt daughter Earlie Server, no answer. Will f/u as able w/ family.  10:37am- Pt from [place] ALF or ILF, message left for Elizebeth Brooking at [place], MSW, [place] Work

## 2019-10-19 NOTE — Progress Notes (Signed)
PT Cancellation Note  Patient Details Name: [name] MRN: [id number] DOB: 12-Nov-1928   Cancelled Treatment:    Reason Eval/Treat Not Completed: Other (comment). Pt very sleepy and resting in bed, only briefly arousing to therapist's voice. Pt's daughter present and other daughter [name] who is an Therapist, sports) was on speaker phone throughout. Daughter's reporting that pt has a rollator but does not use it and receives HHPT 2x/wk. Pt lives with her husband at [facility] in the [place]. Previously pt was independent with ambulation and all ADLs. Although, family would prefer if she ambulated with use of RW/rollator as pt has a tendency to "furniture walk". PT will continue to f/u with pt acutely as available and appropriate.    [place] 10/19/2019, 3:31 PM

## 2019-10-20 LAB — CBC
HCT: 38.3 % (ref 36.0–46.0)
Hemoglobin: 12.1 g/dL (ref 12.0–15.0)
MCH: 32.1 pg (ref 26.0–34.0)
MCHC: 31.6 g/dL (ref 30.0–36.0)
MCV: 101.6 fL — ABNORMAL HIGH (ref 80.0–100.0)
Platelets: 94 10*3/uL — ABNORMAL LOW (ref 150–400)
RBC: 3.77 MIL/uL — ABNORMAL LOW (ref 3.87–5.11)
RDW: 16 % — ABNORMAL HIGH (ref 11.5–15.5)
WBC: 9.9 10*3/uL (ref 4.0–10.5)
nRBC: 0 % (ref 0.0–0.2)

## 2019-10-20 LAB — BASIC METABOLIC PANEL
Anion gap: 11 (ref 5–15)
BUN: 27 mg/dL — ABNORMAL HIGH (ref 8–23)
CO2: 32 mmol/L (ref 22–32)
Calcium: 9.4 mg/dL (ref 8.9–10.3)
Chloride: 100 mmol/L (ref 98–111)
Creatinine, Ser: 0.71 mg/dL (ref 0.44–1.00)
GFR calc Af Amer: >60 mL/min (ref >60)
GFR calc non Af Amer: >60 mL/min (ref >60)
Glucose, Bld: 116 mg/dL — ABNORMAL HIGH (ref 70–99)
Potassium: 3.2 mmol/L — ABNORMAL LOW (ref 3.5–5.1)
Sodium: 143 mmol/L (ref 135–145)

## 2019-10-20 LAB — MAGNESIUM: Magnesium: 2 mg/dL (ref 1.7–2.4)

## 2019-10-20 MED ORDER — POTASSIUM CHLORIDE 20 MEQ PO PACK
40.0000 meq | PACK | Freq: Two times a day (BID) | ORAL | Status: DC
  Administered 2019-10-20 (×2): 40 meq via ORAL
  Filled 2019-10-20 (×3): qty 2

## 2019-10-20 MED ORDER — FUROSEMIDE 10 MG/ML IJ SOLN
20.0000 mg | Freq: Two times a day (BID) | INTRAMUSCULAR | Status: DC
  Administered 2019-10-20: 20 mg via INTRAVENOUS
  Filled 2019-10-20: qty 2

## 2019-10-20 NOTE — Progress Notes (Signed)
Pt has home CPAP with home settings.

## 2019-10-20 NOTE — Progress Notes (Signed)
Progress Note    [name]  [id number] DOB: 1928-08-16  DOA: 10/18/2019 PCP: [name], MD    Brief Narrative:   Medical records reviewed and are as summarized below:  [name] is an 86 y.o. female with medical history significant of HTN, CHB s/p PM, PMR, OSA on CPAP, and achalasia presents with complaints of cough and shortness of breath.  History obtained from the patient as well as her daughter.  At baseline patient had been in independent living at friend's home, but over the last year daughter notes that her memory has declined and is not necessarily always oriented to time and place.  Patient reports that the symptoms have been present for 1 week, but daughter notes that it has been longer than that.  She is coughing up frothy white sputum and at times just spitting large amounts up.  Notes that she has had associated symptoms of poor appetite, orthopnea sleeping in chair or propped up on pillows, and leg swelling present since November 2020.  Her PCP had put her on low-dose furosemide 10 mg daily, but they had not noticed any change in symptoms.    Assessment/Plan:   Principal Problem:   CHF (congestive heart failure) (HCC) Active Problems:   Polymyalgia rheumatica (HCC)   Bilateral pleural effusion   Esophageal dysphagia   Achalasia of esophagus   S/P placement of cardiac pacemaker   Pressure injury of skin   Thrombocytopenia (HCC)   Hypokalemia   Bilateral pleural effusions secondary to suspected diastolic CHF: Acute.  - Patient presents with shortness of breath and cough of white sputum production.  Reports orthopnea.  - BNP mildly elevated at 122.9 and chest x-ray concerning for bilateral pleural effusions.  Review of records notes patient had a loculated right pleural effusion with possible concern bronchopleural fistula in 2014. -Strict I&Os and daily weights -Elevate lower extremities -Flutter valve and incentive spirometry/pulmonary  toilet -TSH within normal range -Lasix 40 mg IV twice daily with good diuresis of 7kg-- will decrease to 20 IV BID -echocardiogram: Left ventricular ejection fraction, by estimation, is 55 to 60%. The left ventricle has normal function. The left ventricle has no regional wall motion abnormalities. Left ventricular diastolic parameters are  consistent with Grade I diastolic  dysfunction (impaired relaxation). Elevated left atrial pressure. -PT/OT consult - home health   Acute respiratory failure with hypoxia -Wean O2 to room air as patient does not wear oxygen at home -Does wear CPAP at night-- will need another sleep study  Hypokalemia:  -Replete p.o. aggressively   Polymyalgia rheumatica on chronic steroids: Patient on prednisone 5 mg daily.  Last sed rate noted to be 33 on 3/24. -Continue prednisone  Thrombocytopenia: Acute on chronic.   -We will change to SCDs  CHB s/p PM -Pacemaker reported to have been interrogated: Have been unable to locate results -Telemetry shows PVCs  GERD, Achalasia, Esophageal dysphagia: Patient with history of reflux and achalasia requiring previous esophageal dilation.  At baseline patient is supposed to be on pured diet but does not follow this at home. -Aspiration precautions -Change Pepcid to schedule -Soft diet  -Pulmonary toilet  Debility -PT/OT  Pressure ulcer sacrum: stage 1 -Low air loss mattress replacement   Family Communication/Anticipated D/C date and plan/Code Status   DVT prophylaxis: SCDs Code Status: DNR Family Communication: Daughter at bedside this a.m Disposition Plan: Continue to work to wean oxygen to room air, continue IV diuresis and aggressive replacement of electrolytes  Medical Consultants:    None.     Subjective:   Daughter states patient walked well yesterday afternoon but overnight had issues with hypoxia despite being on CPAP  Objective:    Vitals:   10/19/19 1236 10/19/19 1823 10/19/19  2330 10/20/19 0552  BP: 119/77 (!) 147/67 128/78 (!) 141/62  Pulse: 90 92 77 68  Resp: 18 18 16 17   Temp: 98.1 F (36.7 C) 97.8 F (36.6 C) 98.3 F (36.8 C) 97.9 F (36.6 C)  TempSrc: Oral Oral Oral Oral  SpO2: 96% 97% 97% 91%  Weight:    52.9 kg  Height:        Intake/Output Summary (Last 24 hours) at 10/20/2019 0944 Last data filed at 10/19/2019 1830 Gross per 24 hour  Intake 480 ml  Output --  Net 480 ml   Filed Weights   10/18/19 0042 10/20/19 0552  Weight: 59 kg 52.9 kg    Exam: Patient walking back from the bathroom when I entered the room, appears winded off oxygen Regular rate and rhythm Minutes of breath sounds with crackles at bases Improved lower extremity edema Alert, pleasant and cooperative   Data Reviewed:   I have personally reviewed following labs and imaging studies:  Labs: Labs show the following:   Basic Metabolic Panel: Recent Labs  Lab 10/18/19 0047 10/18/19 0047 10/19/19 0123 10/20/19 0610  NA 143  --  144 143  K 3.4*   < > 3.1* 3.2*  CL 103  --  99 100  CO2 29  --  30 32  GLUCOSE 117*  --  124* 116*  BUN 22  --  20 27*  CREATININE 0.66  --  0.67 0.71  CALCIUM 9.6  --  9.5 9.4  MG  --   --   --  2.0   < > = values in this interval not displayed.   GFR Estimated Creatinine Clearance: 34.9 mL/min (by C-G formula based on SCr of 0.71 mg/dL). Liver Function Tests: No results for input(s): AST, ALT, ALKPHOS, BILITOT, PROT, ALBUMIN in the last 168 hours. No results for input(s): LIPASE, AMYLASE in the last 168 hours. No results for input(s): AMMONIA in the last 168 hours. Coagulation profile No results for input(s): INR, PROTIME in the last 168 hours.  CBC: Recent Labs  Lab 10/18/19 0047 10/19/19 0123 10/20/19 0610  WBC 6.5 11.1* 9.9  HGB 12.6 13.1 12.1  HCT 40.2 41.5 38.3  MCV 101.3* 100.2* 101.6*  PLT 118* 98* 94*   Cardiac Enzymes: No results for input(s): CKTOTAL, CKMB, CKMBINDEX, TROPONINI in the last 168 hours. BNP  (last 3 results) No results for input(s): PROBNP in the last 8760 hours. CBG: No results for input(s): GLUCAP in the last 168 hours. D-Dimer: No results for input(s): DDIMER in the last 72 hours. Hgb A1c: No results for input(s): HGBA1C in the last 72 hours. Lipid Profile: No results for input(s): CHOL, HDL, LDLCALC, TRIG, CHOLHDL, LDLDIRECT in the last 72 hours. Thyroid function studies: Recent Labs    10/18/19 1211  TSH 0.517   Anemia work up: No results for input(s): VITAMINB12, FOLATE, FERRITIN, TIBC, IRON, RETICCTPCT in the last 72 hours. Sepsis Labs: Recent Labs  Lab 10/18/19 0047 10/19/19 0123 10/20/19 0610  WBC 6.5 11.1* 9.9    Microbiology Recent Results (from the past 240 hour(s))  SARS CORONAVIRUS 2 (TAT 6-24 HRS) Nasopharyngeal Nasopharyngeal Swab     Status: None   Collection Time: 10/18/19  5:01 AM   Specimen:  Nasopharyngeal Swab  Result Value Ref Range Status   SARS Coronavirus 2 NEGATIVE NEGATIVE Final    Comment: (NOTE) SARS-CoV-2 target nucleic acids are NOT DETECTED. The SARS-CoV-2 RNA is generally detectable in upper and lower respiratory specimens during the acute phase of infection. Negative results do not preclude SARS-CoV-2 infection, do not rule out co-infections with other pathogens, and should not be used as the sole basis for treatment or other patient management decisions. Negative results must be combined with clinical observations, patient history, and epidemiological information. The expected result is Negative. Fact Sheet for Patients: SugarRoll.be Fact Sheet for Healthcare Providers: https://www.woods-mathews.com/ This test is not yet approved or cleared by the [place] FDA and  has been authorized for detection and/or diagnosis of SARS-CoV-2 by FDA under an Emergency Use Authorization (EUA). This EUA will remain  in effect (meaning this test can be used) for the duration of the COVID-19  declaration under Section 56 4(b)(1) of the Act, 21 U.S.C. section 360bbb-3(b)(1), unless the authorization is terminated or revoked sooner. Performed at [facility], [place] [address], [place], [place] [address]     Procedures and diagnostic studies:  ECHOCARDIOGRAM COMPLETE  Result Date: 10/18/2019    ECHOCARDIOGRAM REPORT   Patient Name:   LATARA SHELDEN Date of Exam: 10/18/2019 Medical Rec #:  [id number]          Height:       60.0 in Accession #:    [id number]         Weight:       130.0 lb Date of Birth:  Nov 22, 1928           BSA:          1.554 m Patient Age:    86 years           BP:           116/61 mmHg Patient Gender: F                  HR:           69 bpm. Exam Location:  Inpatient Procedure: 2D Echo, Color Doppler and Cardiac Doppler Indications:    [id number] Acute diastolic (congestive) heart failure  History:        Patient has prior history of Echocardiogram examinations, most                 recent 06/27/2012. CHF, Pacemaker; Risk Factors:Sleep Apnea and                 Hypertension. Prior echo performed at [facility].  Sonographer:    Raquel Sarna Senior RDCS Referring Phys: [phone] RONDELL A SMITH  Sonographer Comments: Very technically difficult due to patient body habitus. IMPRESSIONS  1. Left ventricular ejection fraction, by estimation, is 55 to 60%. The left ventricle has normal function. The left ventricle has no regional wall motion abnormalities. Left ventricular diastolic parameters are consistent with Grade I diastolic dysfunction (impaired relaxation). Elevated left atrial pressure.  2. Right ventricular systolic function is normal. The right ventricular size is normal. There is mildly elevated pulmonary artery systolic pressure. The estimated right ventricular systolic pressure is [id number] mmHg.  3. Right atrial size was mildly dilated.  4. The mitral valve is normal in structure. No evidence of mitral valve regurgitation. No evidence of mitral stenosis.  5. The aortic valve was not well  visualized. Aortic valve regurgitation is not visualized. Mild to moderate aortic valve sclerosis/calcification is present, without any  evidence of aortic stenosis.  6. The inferior vena cava is normal in size with greater than 50% respiratory variability, suggesting right atrial pressure of 3 mmHg. FINDINGS  Left Ventricle: Left ventricular ejection fraction, by estimation, is 55 to 60%. The left ventricle has normal function. The left ventricle has no regional wall motion abnormalities. The left ventricular internal cavity size was normal in size. There is  no left ventricular hypertrophy. Left ventricular diastolic parameters are consistent with Grade I diastolic dysfunction (impaired relaxation). Elevated left atrial pressure. Right Ventricle: The right ventricular size is normal. No increase in right ventricular wall thickness. Right ventricular systolic function is normal. There is mildly elevated pulmonary artery systolic pressure. The tricuspid regurgitant velocity is 2.74  m/s, and with an assumed right atrial pressure of 8 mmHg, the estimated right ventricular systolic pressure is 99991111 mmHg. Left Atrium: Left atrial size was normal in size. Right Atrium: Right atrial size was mildly dilated. Pericardium: There is no evidence of pericardial effusion. Mitral Valve: The mitral valve is normal in structure. Normal mobility of the mitral valve leaflets. No evidence of mitral valve regurgitation. No evidence of mitral valve stenosis. Tricuspid Valve: The tricuspid valve is normal in structure. Tricuspid valve regurgitation is not demonstrated. No evidence of tricuspid stenosis. Aortic Valve: The aortic valve was not well visualized. Aortic valve regurgitation is not visualized. Mild to moderate aortic valve sclerosis/calcification is present, without any evidence of aortic stenosis. Pulmonic Valve: The pulmonic valve was normal in structure. Pulmonic valve regurgitation is not visualized. No evidence of pulmonic  stenosis. Aorta: The aortic root is normal in size and structure. Venous: The inferior vena cava is normal in size with greater than 50% respiratory variability, suggesting right atrial pressure of 3 mmHg. IAS/Shunts: No atrial level shunt detected by color flow Doppler. Additional Comments: A pacer wire is visualized.  LEFT VENTRICLE PLAX 2D LVIDd:         2.31 cm  Diastology LVIDs:         1.69 cm  LV e' lateral:   3.81 cm/s LV PW:         1.14 cm  LV E/e' lateral: 25.2 LV IVS:        1.01 cm  LV e' medial:    3.92 cm/s LVOT diam:     2.00 cm  LV E/e' medial:  24.5 LV SV:         61 LV SV Index:   39 LVOT Area:     3.14 cm  RIGHT VENTRICLE RV S prime:     18.60 cm/s TAPSE (M-mode): 1.8 cm LEFT ATRIUM           Index       RIGHT ATRIUM           Index LA diam:      1.80 cm 1.16 cm/m  RA Area:     18.90 cm LA Vol (A4C): 21.9 ml 14.09 ml/m RA Volume:   50.40 ml  32.43 ml/m  AORTIC VALVE LVOT Vmax:   90.70 cm/s LVOT Vmean:  57.700 cm/s LVOT VTI:    0.193 m  AORTA Ao Root diam: 3.20 cm MITRAL VALVE                TRICUSPID VALVE MV Area (PHT): 2.91 cm     TR Peak grad:   30.0 mmHg MV Decel Time: 261 msec     TR Vmax:        274.00 cm/s MV E  velocity: 96.10 cm/s MV A velocity: 168.00 cm/s  SHUNTS MV E/A ratio:  0.57         Systemic VTI:  0.19 m                             Systemic Diam: 2.00 cm Candee Furbish MD Electronically signed by Candee Furbish MD Signature Date/Time: 10/18/2019/1:53:50 PM    Final     Medications:   . famotidine  20 mg Oral Daily  . furosemide  20 mg Intravenous BID  . potassium chloride  40 mEq Oral BID  . predniSONE  5 mg Oral Daily  . sodium chloride flush  3 mL Intravenous Once  . sodium chloride flush  3 mL Intravenous Q12H   Continuous Infusions: . sodium chloride       LOS: 2 days   Geradine Girt  [facility]   How to contact the [facility] Attending or Consulting provider [place] or covering provider during after hours [place], for this patient?  1. Check the care  team in [facility] and look for a) attending/consulting TRH provider listed and b) the [facility] team listed 2. Log into [email or web address] and use [place]'s universal password to access. If you do not have the password, please contact the hospital operator. 3. Locate the [facility] provider you are looking for under [facility] and page to a number that you can be directly reached. 4. If you still have difficulty reaching the provider, please page the [facility] (Director on Call) for the Hospitalists listed on amion for assistance.  10/20/2019, 9:44 AM

## 2019-10-20 NOTE — Evaluation (Signed)
Physical Therapy Evaluation Patient Details Name: [name] MRN: [id number] DOB: 1929/04/13 Today's Date: 10/20/2019   History of Present Illness  [name] is a 86 yo female admitted with CHF exacerbatinon and B pleural effusions.  Pt with a h/o HTN, CHB with PM, PMR, OSA with cpap. Reports of decreased memory and cognition over the last year.    Clinical Impression  Patient presented sitting in chair, finishing breakfast, and willing to participate in therapy. Daughter present throughout session, very encouraging and helpful. PTA, pt was living in an independent living facility with her husband. She did not previously ambulate with an AD but has been 'furniture walking'- family would like her to use RW, pt is agreeable. At the time of evaluation, pt was able to stand from chair and toilet with minA for initial power up, progressing to min guard for safety throughout session. She ambulated ~212ft with min guard and RW with chair follow. She required 3 seated rest breaks due to SOB, first rest break showed SpO2 83% from personal pulseox but 93% with Dinamap. Able to keep SpO2 >90% until completion of ambulation, all on RA. SpO2 92% post-ambulation. Recommend d/c to [facility] with assistance and HHPT. Pt would continue to benefit from skilled physical therapy services at this time while admitted and after d/c to address the below listed limitations in order to improve overall safety and independence with functional mobility.     Follow Up Recommendations Home health PT;Supervision/Assistance - 24 hour    Equipment Recommendations  Rolling walker with 5" wheels    Recommendations for Other Services       Precautions / Restrictions Precautions Precautions: Fall Precaution Comments: Watch SpO2 during ambulation Restrictions Weight Bearing Restrictions: No      Mobility  Bed Mobility               General bed mobility comments: OOB in chair upon  arrival  Transfers Overall transfer level: Needs assistance Equipment used: Rolling walker (2 wheeled) Transfers: Sit to/from Stand Sit to Stand: Min assist;Min guard         General transfer comment: minA initially to power up from chair and toilet, progressively improved to min guard, no physical assist throughout session  Ambulation/Gait Ambulation/Gait assistance: Min guard Gait Distance (Feet): 200 Feet Assistive device: Rolling walker (2 wheeled) Gait Pattern/deviations: Step-through pattern;Decreased stride length;Trunk flexed Gait velocity: dec   General Gait Details: VC for walker safety and sequencing. Pt required 3 seated rest breaks d/t SOB. SpO2 variable from 83%-93% throughout ambulation, improved to >90% all on RA for final 100' of ambulation  Stairs            Wheelchair Mobility    Modified Rankin (Stroke Patients Only)       Balance Overall balance assessment: Needs assistance Sitting-balance support: No upper extremity supported;Feet supported Sitting balance-Leahy Scale: Fair     Standing balance support: Bilateral upper extremity supported;During functional activity Standing balance-Leahy Scale: Poor Standing balance comment: Requires B UE support when standing and for functional mobility. Cannot tolerate weight shifting without UE support                             Pertinent Vitals/Pain Pain Assessment: No/denies pain    Home Living Family/patient expects to be discharged to:: Private residence Living Arrangements: Spouse/significant other Available Help at Discharge: Family;Available 24 hours/day Type of Home: Independent living facility Home Access: Level entry  Home Layout: One level Home Equipment: Walker - 2 wheels;Cane - single point;Shower seat;Grab bars - toilet      Prior Function Level of Independence: Needs assistance   Gait / Transfers Assistance Needed: Previously no AD- furniture walks. Agreeable to  use RW now  ADL's / Homemaking Assistance Needed: meals provided and homemaking provided.  Pt has had a slow decline with adls in last few months. Was fully independent until recently.  Comments: Husband there but he has trouble caring for himself as well so family and hired help take rotations so someone is always with the pt and her husband.     Hand Dominance   Dominant Hand: Right    Extremity/Trunk Assessment   Upper Extremity Assessment Upper Extremity Assessment: Overall WFL for tasks assessed;Defer to OT evaluation    Lower Extremity Assessment Lower Extremity Assessment: Generalized weakness    Cervical / Trunk Assessment Cervical / Trunk Assessment: Kyphotic  Communication   Communication: No difficulties  Cognition Arousal/Alertness: Awake/alert Behavior During Therapy: WFL for tasks assessed/performed Overall Cognitive Status: Within Functional Limits for tasks assessed                                 General Comments: Cognition not formally assessed, WFL for tasks today.       General Comments General comments (skin integrity, edema, etc.): SpO2 95% prior to ambulation. On RA: 83% (accuracy questionable- used personal pulseox then  dinamap which showed 93%). Pt able to keep SpO2 >90% thru completion of ambulation with 2 additional seated rest breaks    Exercises     Assessment/Plan    PT Assessment Patient needs continued PT services  PT Problem List Decreased strength;Decreased activity tolerance;Decreased balance;Decreased mobility;Decreased coordination;Decreased knowledge of use of DME;Decreased safety awareness;Cardiopulmonary status limiting activity       PT Treatment Interventions DME instruction;Gait training;Stair training;Functional mobility training;Therapeutic activities;Therapeutic exercise;Balance training;Cognitive remediation    PT Goals (Current goals can be found in the Care Plan section)  Acute Rehab PT Goals Patient  Stated Goal: to walk better PT Goal Formulation: With patient/family Time For Goal Achievement: 11/03/19 Potential to Achieve Goals: Good    Frequency Min 3X/week   Barriers to discharge        Co-evaluation               AM-PAC PT "6 Clicks" Mobility  Outcome Measure Help needed turning from your back to your side while in a flat bed without using bedrails?: A Little Help needed moving from lying on your back to sitting on the side of a flat bed without using bedrails?: A Little Help needed moving to and from a bed to a chair (including a wheelchair)?: A Little Help needed standing up from a chair using your arms (e.g., wheelchair or bedside chair)?: None Help needed to walk in hospital room?: A Little Help needed climbing 3-5 steps with a railing? : A Lot 6 Click Score: 18    End of Session Equipment Utilized During Treatment: Gait belt Activity Tolerance: Patient tolerated treatment well Patient left: in chair;with family/visitor present Nurse Communication: Mobility status PT Visit Diagnosis: Unsteadiness on feet (R26.81);Other abnormalities of gait and mobility (R26.89)    Time: 1324-4010 PT Time Calculation (min) (ACUTE ONLY): 29 min   Charges:   PT Evaluation $PT Eval Moderate Complexity: 1 Mod PT Treatments $Gait Training: 8-22 mins       Adom Schoeneck, SPT  Acute Rehab  [phone]   Wyatt Portela 10/20/2019, 12:41 PM

## 2019-10-20 NOTE — Progress Notes (Signed)
PT Progress Note for Charges    10/20/19 1200  PT Visit Information  Last PT Received On 10/20/19  PT General Charges  $$ ACUTE PT VISIT 1 Visit  PT Evaluation  $PT Eval Moderate Complexity 1 Mod  PT Treatments  $Gait Training 8-22 mins  Anastasio Champion, DPT  Acute Rehabilitation Services Pager [phone] Office [phone]

## 2019-10-21 LAB — BASIC METABOLIC PANEL
Anion gap: 10 (ref 5–15)
BUN: 29 mg/dL — ABNORMAL HIGH (ref 8–23)
CO2: 35 mmol/L — ABNORMAL HIGH (ref 22–32)
Calcium: 10 mg/dL (ref 8.9–10.3)
Chloride: 97 mmol/L — ABNORMAL LOW (ref 98–111)
Creatinine, Ser: 0.69 mg/dL (ref 0.44–1.00)
GFR calc Af Amer: >60 mL/min (ref >60)
GFR calc non Af Amer: >60 mL/min (ref >60)
Glucose, Bld: 127 mg/dL — ABNORMAL HIGH (ref 70–99)
Potassium: 4.2 mmol/L (ref 3.5–5.1)
Sodium: 142 mmol/L (ref 135–145)

## 2019-10-21 LAB — CBC
HCT: 42.6 % (ref 36.0–46.0)
Hemoglobin: 13.4 g/dL (ref 12.0–15.0)
MCH: 31.8 pg (ref 26.0–34.0)
MCHC: 31.5 g/dL (ref 30.0–36.0)
MCV: 100.9 fL — ABNORMAL HIGH (ref 80.0–100.0)
Platelets: 105 10*3/uL — ABNORMAL LOW (ref 150–400)
RBC: 4.22 MIL/uL (ref 3.87–5.11)
RDW: 16.2 % — ABNORMAL HIGH (ref 11.5–15.5)
WBC: 10 10*3/uL (ref 4.0–10.5)
nRBC: 0 % (ref 0.0–0.2)

## 2019-10-21 MED ORDER — FUROSEMIDE 40 MG PO TABS
40.0000 mg | ORAL_TABLET | Freq: Every day | ORAL | Status: DC
  Administered 2019-10-21 – 2019-10-22 (×2): 40 mg via ORAL
  Filled 2019-10-21 (×2): qty 1

## 2019-10-21 MED ORDER — POTASSIUM CHLORIDE 20 MEQ PO PACK
20.0000 meq | PACK | Freq: Every day | ORAL | Status: DC
  Administered 2019-10-21 – 2019-10-22 (×2): 20 meq via ORAL
  Filled 2019-10-21 (×2): qty 1

## 2019-10-21 NOTE — Progress Notes (Signed)
Progress Note    [name]  [id number] DOB: [birth year]  DOA: 10/18/2019 PCP: [name], MD    Brief Narrative:   Medical records reviewed and are as summarized below:  [name] is an 86 y.o. female with medical history significant of HTN, CHB s/p PM, PMR, OSA on CPAP, and achalasia presents with complaints of cough and shortness of breath.  History obtained from the patient as well as her daughter.  At baseline patient had been in independent living at friend's home, but over the last year daughter notes that her memory has declined and is not necessarily always oriented to time and place.  Patient reports that the symptoms have been present for 1 week, but daughter notes that it has been longer than that.  She is coughing up frothy white sputum and at times just spitting large amounts up.  Notes that she has had associated symptoms of poor appetite, orthopnea sleeping in chair or propped up on pillows, and leg swelling present since November 2020.  Her PCP had put her on low-dose furosemide 10 mg daily, but they had not noticed any change in symptoms.    Assessment/Plan:   Principal Problem:   CHF (congestive heart failure) (HCC) Active Problems:   Polymyalgia rheumatica (HCC)   Bilateral pleural effusion   Esophageal dysphagia   Achalasia of esophagus   S/P placement of cardiac pacemaker   Pressure injury of skin   Thrombocytopenia (HCC)   Hypokalemia   Bilateral pleural effusions secondary to suspected diastolic CHF: Acute.  - Patient presents with shortness of breath and cough of white sputum production.  Reports orthopnea.  - BNP mildly elevated at 122.9 and chest x-ray concerning for bilateral pleural effusions.  Review of records notes patient had a loculated right pleural effusion with possible concern bronchopleural fistula in 2014. -Strict I&Os and daily weights -Elevate lower extremities -Flutter valve and incentive spirometry/pulmonary  toilet -TSH within normal range -patient has diuresed will with IV lasix-- switch to PO and monitor -echocardiogram: Left ventricular ejection fraction, by estimation, is 55 to 60%. The left ventricle has normal function. The left ventricle has no regional wall motion abnormalities. Left ventricular diastolic parameters are  consistent with Grade I diastolic  dysfunction (impaired relaxation). Elevated left atrial pressure. -PT/OT consult - home health   Acute respiratory failure with hypoxia -Wean O2 to room air as patient does not wear oxygen at home -Does wear CPAP at night-- will need another sleep study and most likely O2 at night as she has been de-satting into the low 80s  Hypokalemia:  -Replete p.o. aggressively   Polymyalgia rheumatica on chronic steroids: Patient on prednisone 5 mg daily.  Last sed rate noted to be 33 on 3/24. -Continue prednisone  Thrombocytopenia: Acute on chronic.   -We will change to SCDs  CHB s/p PM -Pacemaker reported to have been interrogated: Have been unable to locate results -Telemetry shows PVCs  GERD, Achalasia, Esophageal dysphagia: Patient with history of reflux and achalasia requiring previous esophageal dilation.  At baseline patient is supposed to be on pured diet but does not follow this at home. -Aspiration precautions -Change Pepcid to scheduled -DYS diet -Pulmonary toilet  Debility -PT/OT  Pressure ulcer sacrum: stage 1 -Low air loss mattress replacement  Family and patient desire to avoid further hospitalizations if able.  Family concerned about patient's p.o. intake as she does not follow the recommended dysphagia pured diet.  Will get palliative care consult for  goals of care.  Family Communication/Anticipated D/C date and plan/Code Status   DVT prophylaxis: SCDs Code Status: DNR Family Communication: Daughters at bedside this a.m Disposition Plan: Home/ILF in the next 24 hours most likely   Medical Consultants:     Palliative care     Subjective:   Family states patient not eating much and continues to have regurgitation  Objective:    Vitals:   10/20/19 1812 10/20/19 1822 10/21/19 0009 10/21/19 0557  BP:  90/65 126/71 136/64  Pulse: 100  81 72  Resp: 16  17 17   Temp: 97.9 F (36.6 C)  98.1 F (36.7 C) 98.8 F (37.1 C)  TempSrc: Oral  Oral Oral  SpO2: 91%  100% 95%  Weight:    47.1 kg  Height:        Intake/Output Summary (Last 24 hours) at 10/21/2019 1156 Last data filed at 10/20/2019 2100 Gross per 24 hour  Intake 120 ml  Output --  Net 120 ml   Filed Weights   10/18/19 0042 10/20/19 0552 10/21/19 0557  Weight: 59 kg 52.9 kg 47.1 kg    Exam: In bed, sleeping soundly with oxygenation's around 90% on 2 L O2; minimal improvement when awake Patient has upper airway sounds like mucus but is able to clear with a cough Regular rate and rhythm Positive bowel sounds, soft nontender Chronically ill appearing Pleasant and cooperative   Data Reviewed:   I have personally reviewed following labs and imaging studies:  Labs: Labs show the following:   Basic Metabolic Panel: Recent Labs  Lab 10/18/19 0047 10/18/19 0047 10/19/19 0123 10/19/19 0123 10/20/19 0610 10/21/19 0126  NA 143  --  144  --  143 142  K 3.4*   < > 3.1*   < > 3.2* 4.2  CL 103  --  99  --  100 97*  CO2 29  --  30  --  32 35*  GLUCOSE 117*  --  124*  --  116* 127*  BUN 22  --  20  --  27* 29*  CREATININE 0.66  --  0.67  --  0.71 0.69  CALCIUM 9.6  --  9.5  --  9.4 10.0  MG  --   --   --   --  2.0  --    < > = values in this interval not displayed.   GFR Estimated Creatinine Clearance: 34.9 mL/min (by C-G formula based on SCr of 0.69 mg/dL). Liver Function Tests: No results for input(s): AST, ALT, ALKPHOS, BILITOT, PROT, ALBUMIN in the last 168 hours. No results for input(s): LIPASE, AMYLASE in the last 168 hours. No results for input(s): AMMONIA in the last 168 hours. Coagulation  profile No results for input(s): INR, PROTIME in the last 168 hours.  CBC: Recent Labs  Lab 10/18/19 0047 10/19/19 0123 10/20/19 0610 10/21/19 0126  WBC 6.5 11.1* 9.9 10.0  HGB 12.6 13.1 12.1 13.4  HCT 40.2 41.5 38.3 42.6  MCV 101.3* 100.2* 101.6* 100.9*  PLT 118* 98* 94* 105*   Cardiac Enzymes: No results for input(s): CKTOTAL, CKMB, CKMBINDEX, TROPONINI in the last 168 hours. BNP (last 3 results) No results for input(s): PROBNP in the last 8760 hours. CBG: No results for input(s): GLUCAP in the last 168 hours. D-Dimer: No results for input(s): DDIMER in the last 72 hours. Hgb A1c: No results for input(s): HGBA1C in the last 72 hours. Lipid Profile: No results for input(s): CHOL, HDL, LDLCALC, TRIG, CHOLHDL, LDLDIRECT  in the last 72 hours. Thyroid function studies: Recent Labs    10/18/19 1211  TSH 0.517   Anemia work up: No results for input(s): VITAMINB12, FOLATE, FERRITIN, TIBC, IRON, RETICCTPCT in the last 72 hours. Sepsis Labs: Recent Labs  Lab 10/18/19 0047 10/19/19 0123 10/20/19 0610 10/21/19 0126  WBC 6.5 11.1* 9.9 10.0    Microbiology Recent Results (from the past 240 hour(s))  SARS CORONAVIRUS 2 (TAT 6-24 HRS) Nasopharyngeal Nasopharyngeal Swab     Status: None   Collection Time: 10/18/19  5:01 AM   Specimen: Nasopharyngeal Swab  Result Value Ref Range Status   SARS Coronavirus 2 NEGATIVE NEGATIVE Final    Comment: (NOTE) SARS-CoV-2 target nucleic acids are NOT DETECTED. The SARS-CoV-2 RNA is generally detectable in upper and lower respiratory specimens during the acute phase of infection. Negative results do not preclude SARS-CoV-2 infection, do not rule out co-infections with other pathogens, and should not be used as the sole basis for treatment or other patient management decisions. Negative results must be combined with clinical observations, patient history, and epidemiological information. The expected result is Negative. Fact Sheet for  Patients: SugarRoll.be Fact Sheet for Healthcare Providers: https://www.woods-mathews.com/ This test is not yet approved or cleared by the [place] FDA and  has been authorized for detection and/or diagnosis of SARS-CoV-2 by FDA under an Emergency Use Authorization (EUA). This EUA will remain  in effect (meaning this test can be used) for the duration of the COVID-19 declaration under Section 56 4(b)(1) of the Act, 21 U.S.C. section 360bbb-3(b)(1), unless the authorization is terminated or revoked sooner. Performed at [facility], [place] [address] [place], [place] [address]     Procedures and diagnostic studies:  No results found.  Medications:   . famotidine  20 mg Oral Daily  . furosemide  40 mg Oral Daily  . potassium chloride  20 mEq Oral Daily  . predniSONE  5 mg Oral Daily  . sodium chloride flush  3 mL Intravenous Once  . sodium chloride flush  3 mL Intravenous Q12H   Continuous Infusions: . sodium chloride       LOS: 3 days   [place] [facility]   How to contact the [facility] Attending or Consulting provider [place] or covering provider during after hours [place], for this patient?  1. Check the care team in [facility] and look for a) attending/consulting [facility] provider listed and b) the [facility] team listed 2. Log into [email or web address] and use [place]'s universal password to access. If you do not have the password, please contact the hospital operator. 3. Locate the [facility] provider you are looking for under [facility] and page to a number that you can be directly reached. 4. If you still have difficulty reaching the provider, please page the [facility] (Director on Call) for the Hospitalists listed on amion for assistance.  10/21/2019, 11:56 AM

## 2019-10-21 NOTE — Progress Notes (Signed)
Pt has home CPAP at bedside. Water chamber filled with sterile water, and pt's daughter states she can help pt with machine if needed. Advised pt/daughter to notify for RT if any further assistance is needed.

## 2019-10-22 ENCOUNTER — Telehealth: Payer: Self-pay | Admitting: *Deleted

## 2019-10-22 LAB — BASIC METABOLIC PANEL
Anion gap: 10 (ref 5–15)
BUN: 26 mg/dL — ABNORMAL HIGH (ref 8–23)
CO2: 33 mmol/L — ABNORMAL HIGH (ref 22–32)
Calcium: 9.7 mg/dL (ref 8.9–10.3)
Chloride: 98 mmol/L (ref 98–111)
Creatinine, Ser: 0.6 mg/dL (ref 0.44–1.00)
GFR calc Af Amer: >60 mL/min (ref >60)
GFR calc non Af Amer: >60 mL/min (ref >60)
Glucose, Bld: 125 mg/dL — ABNORMAL HIGH (ref 70–99)
Potassium: 4 mmol/L (ref 3.5–5.1)
Sodium: 141 mmol/L (ref 135–145)

## 2019-10-22 MED ORDER — FUROSEMIDE 40 MG PO TABS: 40.0000 mg | ORAL_TABLET | Freq: Every day | ORAL | 0 refills | Status: DC

## 2019-10-22 MED ORDER — POTASSIUM CHLORIDE 20 MEQ PO PACK: 20.0000 meq | PACK | Freq: Every day | ORAL | 0 refills | Status: DC

## 2019-10-22 MED ORDER — FAMOTIDINE 20 MG PO TABS: 20.0000 mg | ORAL_TABLET | Freq: Every day | ORAL | Status: DC

## 2019-10-22 MED ORDER — ACETAMINOPHEN 325 MG PO TABS: 650.0000 mg | ORAL_TABLET | ORAL | Status: DC | PRN

## 2019-10-22 MED FILL — POTASSIUM CHLORIDE 20 MEQ P: 20 | 30 days supply | Qty: 30 | Fill #0

## 2019-10-22 MED FILL — FUROSEMIDE 40 MG TABLET: 40 | 30 days supply | Qty: 30 | Fill #0

## 2019-10-22 NOTE — Discharge Summary (Signed)
Physician Discharge Summary  ARETZY CLENDENEN [id number] DOB: 25-Jul-1928 DOA: 10/18/2019  PCP: [name], MD  Admit date: 10/18/2019 Discharge date: 10/22/2019  Admitted From: Home Discharge disposition: Home   Recommendations for Outpatient Follow-Up:   1. BMP on Thursday 2. Patient to monitor weight daily 3. Home health 4. Consider community palliative care referral for goals of care and strategies to avoid rehospitalization 5. Dysphagia diet per Dr. Silverio Decamp 6. Will need outpatient CPAP study with possible oxygenation at night   Discharge Diagnosis:   Principal Problem:   CHF (congestive heart failure) (HCC) Active Problems:   Polymyalgia rheumatica (HCC)   Bilateral pleural effusion   Esophageal dysphagia   Achalasia of esophagus   S/P placement of cardiac pacemaker   Pressure injury of skin   Thrombocytopenia (HCC)   Hypokalemia    Discharge Condition: Stable  Diet recommendation: Dysphagia diet  Wound care: None.  Code status: DNR   History of Present Illness:    TAALIAH KROUGH is a 86 y.o. female with medical history significant of HTN, CHB s/p PM, PMR, OSA on CPAP, and achalasia presents with complaints of cough and shortness of breath.  History obtained from the patient as well as her daughter.  At baseline patient had been in independent living at friend's home, but over the last year daughter notes that her memory has declined and is not necessarily always oriented to time and place.  Patient reports that the symptoms have been present for 1 week, but daughter notes that it has been longer than that.  She is coughing up frothy white sputum and at times just spitting large amounts up.  Notes that she has had associated symptoms of poor appetite, orthopnea sleeping in chair or propped up on pillows, and leg swelling present since November 2020.  Her PCP had put her on low-dose furosemide 10 mg daily, but they had not noticed any change in  symptoms.  Patient was having more difficulty getting around recently and family was in the process of trying to get her change from independent living or finding more assistance.  She has not had any reports of fever, chest pain, nausea, vomiting.  Daughter is concerned for possible aspiration as patient has not been following pureed diet previously recommended.  ED Course: Upon admission into the emergency department patient was seen to be afebrile, respirations 21-33, blood pressure is elevated up to 172/87, and O2 saturations maintained 92 to 96% on room air.  Labs significant for platelets 118, potassium 3.4, troponin is negative x2, and BNP 122.9.  Chest x-ray significant for chronic lung markings without infiltrate, dilated esophagus unchanged, and bilateral pleural effusions.  Patient was given 40 mg of Lasix IV.  TRH called to admit for suspected CHF exacerbation.    Hospital Course by Problem:   Bilateral pleural effusions secondary to suspected diastolic CHF: Acute. -Patient presents with shortness of breath and cough of white sputum production. Reports orthopnea.  -BNP mildly elevated at 122.9 and chest x-ray concerning for bilateral pleural effusions. Review of records notes patient had a loculated right pleural effusion with possible concern bronchopleural fistula in 2014. -Flutter valve and incentive spirometry/pulmonary toilet -TSH within normal range -patient has diuresed will with IV lasix-- switch to PO and monitor -echocardiogram: Left ventricular ejection fraction, by estimation, is 55 to 60%. The left ventricle has normal function. The left ventricle has no regional wall motion abnormalities. Left ventricular diastolic parameters are  consistent with Grade  I diastolic  dysfunction (impaired relaxation). Elevated left atrial pressure. -PT/OT consult- home health  Acute respiratory failure with hypoxia -Wean O2 to room air as patient does not wear oxygen at home -Does  wear CPAP at night-- will need another sleep study and most likely O2 at night as she has been de-satting into the low 80s  Hypokalemia:  -Replete needed  Polymyalgia rheumatica on chronic steroids: Patient on prednisone 5 mg daily.Last sed rate noted to be 33 on 3/24. -Continue prednisone  Thrombocytopenia: Acute on chronic.  -Improved off of Lovenox  CHB s/p PM -Pacemaker reported to have been interrogated: Have been unable to locate results -Telemetry shows PVCs  GERD,Achalasia, Esophageal dysphagia: Patient with history of reflux and achalasiarequiring previousesophageal dilation. At baseline patient is supposed to be on pured diet but does not follow this at home. -Aspiration precautions -Change Pepcid to scheduled -DYS diet -Pulmonary toilet  Debility -PT/OT-Home health  Pressure ulcer sacrum: stage 1 (POA)   Medical Consultants:      Discharge Exam:   Vitals:   10/22/19 0011 10/22/19 0629  BP: 133/76 134/62  Pulse: 82 69  Resp: 18 18  Temp: 97.7 F (36.5 C) 97.6 F (36.4 C)  SpO2: 98% 98%   Vitals:   10/21/19 1234 10/21/19 1816 10/22/19 0011 10/22/19 0629  BP: (!) 115/54 106/83 133/76 134/62  Pulse: 80 84 82 69  Resp: 16 18 18 18   Temp: 98 F (36.7 C) 99.5 F (37.5 C) 97.7 F (36.5 C) 97.6 F (36.4 C)  TempSrc: Oral Oral Oral Oral  SpO2: 92% 92% 98% 98%  Weight:    47.5 kg  Height:        General exam: Appears calm and comfortable.    The results of significant diagnostics from this hospitalization (including imaging, microbiology, ancillary and laboratory) are listed below for reference.     Procedures and Diagnostic Studies:   DG Chest 2 View  Result Date: 10/18/2019 CLINICAL DATA:  Cough and shortness of breath. EXAM: CHEST - 2 VIEW COMPARISON:  October 09, 2015 FINDINGS: There is a dual lead AICD. The lungs are hyperinflated. Mild chronic appearing increased lung markings are seen without evidence of acute infiltrate. There  is mild blunting of the bilateral costophrenic angles. No pneumothorax is seen. The heart size and mediastinal contours are within normal limits. The there is a large hiatal hernia with a markedly dilated esophagus. This is stable in appearance when compared to the prior exam. Degenerative changes are noted throughout the thoracic spine. IMPRESSION: 1. Chronic appearing increased lung markings without evidence of an acute infiltrate. 2. Large hiatal hernia with a markedly dilated esophagus, unchanged in appearance when compared to the prior study dated October 09, 2015. 3. Small bilateral pleural effusions. Electronically Signed   By: Virgina Norfolk M.D.   On: 10/18/2019 01:16   ECHOCARDIOGRAM COMPLETE  Result Date: 10/18/2019    ECHOCARDIOGRAM REPORT   Patient Name:   KASHVI OSUCH Date of Exam: 10/18/2019 Medical Rec #:  [id number]          Height:       60.0 in Accession #:    [id number]         Weight:       130.0 lb Date of Birth:  1928/09/30           BSA:          1.554 m Patient Age:    86 years  BP:           116/61 mmHg Patient Gender: F                  HR:           69 bpm. Exam Location:  Inpatient Procedure: 2D Echo, Color Doppler and Cardiac Doppler Indications:    [id number] Acute diastolic (congestive) heart failure  History:        Patient has prior history of Echocardiogram examinations, most                 recent 06/27/2012. CHF, Pacemaker; Risk Factors:Sleep Apnea and                 Hypertension. Prior echo performed at [facility].  Sonographer:    Raquel Sarna Senior RDCS Referring Phys: [phone] RONDELL A SMITH  Sonographer Comments: Very technically difficult due to patient body habitus. IMPRESSIONS  1. Left ventricular ejection fraction, by estimation, is 55 to 60%. The left ventricle has normal function. The left ventricle has no regional wall motion abnormalities. Left ventricular diastolic parameters are consistent with Grade I diastolic dysfunction (impaired relaxation). Elevated left  atrial pressure.  2. Right ventricular systolic function is normal. The right ventricular size is normal. There is mildly elevated pulmonary artery systolic pressure. The estimated right ventricular systolic pressure is 99991111 mmHg.  3. Right atrial size was mildly dilated.  4. The mitral valve is normal in structure. No evidence of mitral valve regurgitation. No evidence of mitral stenosis.  5. The aortic valve was not well visualized. Aortic valve regurgitation is not visualized. Mild to moderate aortic valve sclerosis/calcification is present, without any evidence of aortic stenosis.  6. The inferior vena cava is normal in size with greater than 50% respiratory variability, suggesting right atrial pressure of 3 mmHg. FINDINGS  Left Ventricle: Left ventricular ejection fraction, by estimation, is 55 to 60%. The left ventricle has normal function. The left ventricle has no regional wall motion abnormalities. The left ventricular internal cavity size was normal in size. There is  no left ventricular hypertrophy. Left ventricular diastolic parameters are consistent with Grade I diastolic dysfunction (impaired relaxation). Elevated left atrial pressure. Right Ventricle: The right ventricular size is normal. No increase in right ventricular wall thickness. Right ventricular systolic function is normal. There is mildly elevated pulmonary artery systolic pressure. The tricuspid regurgitant velocity is 2.74  m/s, and with an assumed right atrial pressure of 8 mmHg, the estimated right ventricular systolic pressure is 99991111 mmHg. Left Atrium: Left atrial size was normal in size. Right Atrium: Right atrial size was mildly dilated. Pericardium: There is no evidence of pericardial effusion. Mitral Valve: The mitral valve is normal in structure. Normal mobility of the mitral valve leaflets. No evidence of mitral valve regurgitation. No evidence of mitral valve stenosis. Tricuspid Valve: The tricuspid valve is normal in structure.  Tricuspid valve regurgitation is not demonstrated. No evidence of tricuspid stenosis. Aortic Valve: The aortic valve was not well visualized. Aortic valve regurgitation is not visualized. Mild to moderate aortic valve sclerosis/calcification is present, without any evidence of aortic stenosis. Pulmonic Valve: The pulmonic valve was normal in structure. Pulmonic valve regurgitation is not visualized. No evidence of pulmonic stenosis. Aorta: The aortic root is normal in size and structure. Venous: The inferior vena cava is normal in size with greater than 50% respiratory variability, suggesting right atrial pressure of 3 mmHg. IAS/Shunts: No atrial level shunt detected by color flow Doppler. Additional Comments: A  pacer wire is visualized.  LEFT VENTRICLE PLAX 2D LVIDd:         2.31 cm  Diastology LVIDs:         1.69 cm  LV e' lateral:   3.81 cm/s LV PW:         1.14 cm  LV E/e' lateral: 25.2 LV IVS:        1.01 cm  LV e' medial:    3.92 cm/s LVOT diam:     2.00 cm  LV E/e' medial:  24.5 LV SV:         61 LV SV Index:   39 LVOT Area:     3.14 cm  RIGHT VENTRICLE RV S prime:     18.60 cm/s TAPSE (M-mode): 1.8 cm LEFT ATRIUM           Index       RIGHT ATRIUM           Index LA diam:      1.80 cm 1.16 cm/m  RA Area:     18.90 cm LA Vol (A4C): 21.9 ml 14.09 ml/m RA Volume:   50.40 ml  32.43 ml/m  AORTIC VALVE LVOT Vmax:   90.70 cm/s LVOT Vmean:  57.700 cm/s LVOT VTI:    0.193 m  AORTA Ao Root diam: 3.20 cm MITRAL VALVE                TRICUSPID VALVE MV Area (PHT): 2.91 cm     TR Peak grad:   30.0 mmHg MV Decel Time: 261 msec     TR Vmax:        274.00 cm/s MV E velocity: 96.10 cm/s MV A velocity: 168.00 cm/s  SHUNTS MV E/A ratio:  0.57         Systemic VTI:  0.19 m                             Systemic Diam: 2.00 cm Candee Furbish MD Electronically signed by Candee Furbish MD Signature Date/Time: 10/18/2019/1:53:50 PM    Final      Labs:   Basic Metabolic Panel: Recent Labs  Lab 10/18/19 0047 10/18/19 0047  10/19/19 0123 10/19/19 0123 10/20/19 0610 10/20/19 0610 10/21/19 0126 10/22/19 0113  NA 143  --  144  --  143  --  142 141  K 3.4*   < > 3.1*   < > 3.2*   < > 4.2 4.0  CL 103  --  99  --  100  --  97* 98  CO2 29  --  30  --  32  --  35* 33*  GLUCOSE 117*  --  124*  --  116*  --  127* 125*  BUN 22  --  20  --  27*  --  29* 26*  CREATININE 0.66  --  0.67  --  0.71  --  0.69 0.60  CALCIUM 9.6  --  9.5  --  9.4  --  10.0 9.7  MG  --   --   --   --  2.0  --   --   --    < > = values in this interval not displayed.   GFR Estimated Creatinine Clearance: 34.9 mL/min (by C-G formula based on SCr of 0.6 mg/dL). Liver Function Tests: No results for input(s): AST, ALT, ALKPHOS, BILITOT, PROT, ALBUMIN in the last 168 hours. No results for input(s):  LIPASE, AMYLASE in the last 168 hours. No results for input(s): AMMONIA in the last 168 hours. Coagulation profile No results for input(s): INR, PROTIME in the last 168 hours.  CBC: Recent Labs  Lab 10/18/19 0047 10/19/19 0123 10/20/19 0610 10/21/19 0126  WBC 6.5 11.1* 9.9 10.0  HGB 12.6 13.1 12.1 13.4  HCT 40.2 41.5 38.3 42.6  MCV 101.3* 100.2* 101.6* 100.9*  PLT 118* 98* 94* 105*   Cardiac Enzymes: No results for input(s): CKTOTAL, CKMB, CKMBINDEX, TROPONINI in the last 168 hours. BNP: Invalid input(s): POCBNP CBG: No results for input(s): GLUCAP in the last 168 hours. D-Dimer No results for input(s): DDIMER in the last 72 hours. Hgb A1c No results for input(s): HGBA1C in the last 72 hours. Lipid Profile No results for input(s): CHOL, HDL, LDLCALC, TRIG, CHOLHDL, LDLDIRECT in the last 72 hours. Thyroid function studies No results for input(s): TSH, T4TOTAL, T3FREE, THYROIDAB in the last 72 hours.  Invalid input(s): FREET3 Anemia work up No results for input(s): VITAMINB12, FOLATE, FERRITIN, TIBC, IRON, RETICCTPCT in the last 72 hours. Microbiology Recent Results (from the past 240 hour(s))  SARS CORONAVIRUS 2 (TAT 6-24 HRS)  Nasopharyngeal Nasopharyngeal Swab     Status: None   Collection Time: 10/18/19  5:01 AM   Specimen: Nasopharyngeal Swab  Result Value Ref Range Status   SARS Coronavirus 2 NEGATIVE NEGATIVE Final    Comment: (NOTE) SARS-CoV-2 target nucleic acids are NOT DETECTED. The SARS-CoV-2 RNA is generally detectable in upper and lower respiratory specimens during the acute phase of infection. Negative results do not preclude SARS-CoV-2 infection, do not rule out co-infections with other pathogens, and should not be used as the sole basis for treatment or other patient management decisions. Negative results must be combined with clinical observations, patient history, and epidemiological information. The expected result is Negative. Fact Sheet for Patients: SugarRoll.be Fact Sheet for Healthcare Providers: https://www.woods-mathews.com/ This test is not yet approved or cleared by the [place] FDA and  has been authorized for detection and/or diagnosis of SARS-CoV-2 by FDA under an Emergency Use Authorization (EUA). This EUA will remain  in effect (meaning this test can be used) for the duration of the COVID-19 declaration under Section 56 4(b)(1) of the Act, 21 U.S.C. section 360bbb-3(b)(1), unless the authorization is terminated or revoked sooner. Performed at [facility], [place] [address], [place], [place] [address]      Discharge Instructions:   Discharge Instructions    (HEART FAILURE PATIENTS) Call MD:  Anytime you have any of the following symptoms: 1) 3 pound weight gain in 24 hours or 5 pounds in 1 week 2) shortness of breath, with or without a dry hacking cough 3) swelling in the hands, feet or stomach 4) if you have to sleep on extra pillows at night in order to breathe.   Complete by: As directed    Heart Failure patients record your daily weight using the same scale at the same time of day   Complete by: As directed     Increase activity slowly   Complete by: As directed      Allergies as of 10/22/2019      Reactions   Actonel [risedronate Sodium] Other (See Comments)   Joint aches; rechallenged --caused joint aches   Ivp Dye [iodinated Diagnostic Agents] Nausea And Vomiting      Medication List    STOP taking these medications   ibuprofen 200 MG tablet Commonly known as: ADVIL   Klor-Con M20  20 MEQ tablet Generic drug: potassium chloride SA     TAKE these medications   acetaminophen 325 MG tablet Commonly known as: TYLENOL Take 2 tablets (650 mg total) by mouth every 4 (four) hours as needed for headache or mild pain.   CALCIUM/VITAMIN D3/ADULT GUMMY PO Take 1 tablet by mouth See admin instructions. 500 mg calcium, 1000 iu vitamin D3, Phosphorous 230mg . 3 daily   [employer] JO[employer] TRIPLE ACTION PO Take 1 tablet by mouth daily.   famotidine 20 MG tablet Commonly known as: PEPCID Take 1 tablet (20 mg total) by mouth daily. Start taking on: October 23, 2019 What changed:   when to take this  reasons to take this   furosemide 40 MG tablet Commonly known as: LASIX Take 1 tablet (40 mg total) by mouth daily. Start taking on: October 23, 2019 What changed:   medication strength  how much to take   HM MULTIVITAMIN ADULT GUMMY PO Take 3 tablets by mouth daily.   OCUVITE EYE + MULTI PO Take 1 tablet by mouth daily.   potassium chloride 20 MEQ packet Commonly known as: KLOR-CON Take 20 mEq by mouth daily. Start taking on: October 23, 2019   predniSONE 5 MG tablet Commonly known as: DELTASONE Take 1 tablet (5 mg total) by mouth daily.            Durable Medical Equipment  (From admission, onward)         Start     Ordered   10/19/19 1209  For home use only DME 3 n 1  Once     10/19/19 1208         Follow-up Information    [name], MD Follow up in 1 week(s).   Specialty: Internal Medicine Contact information: [place]  [id number] [phone]            Time coordinating discharge: 35 min Signed:  Geradine Girt DO  [facility] 10/22/2019, 11:56 AM

## 2019-10-22 NOTE — Progress Notes (Signed)
SATURATION QUALIFICATIONS: (This note is used to comply with regulatory documentation for home oxygen)  Patient Saturations on Room Air at Rest = 94%  Patient Saturations on Room Air while Ambulating = 90%   Please briefly explain why patient needs home oxygen: Patient does not need home O2.

## 2019-10-22 NOTE — Discharge Instructions (Signed)
Follow diet by Dr. Rosie Fate self daily-- same time, after voiding, write it down

## 2019-10-22 NOTE — Telephone Encounter (Signed)
Transition Care Management Follow-up Telephone Call  Date of discharge and from where: 10/22/2019 [place]  How have you been since you were released from the hospital? better  Any questions or concerns? No   Items Reviewed:  Did the pt receive and understand the discharge instructions provided? Yes   Medications obtained and verified? Yes   Any new allergies since your discharge? No   Dietary orders reviewed? Yes  Do you have support at home? Yes   Other (ie: DME, Home Health, etc) Home Health  Functional Questionnaire: (I = Independent and D = Dependent) ADL's: I with Assistance  Bathing/Dressing- I with assistance   Meal Prep- D  Eating- I  Maintaining continence- I  Transferring/Ambulation- I with assistance  Managing Meds- I   Follow up appointments reviewed:    PCP Hospital f/u appt confirmed? Yes  Scheduled to see ManXie on 10/23/2019 @ 2.  [facility] f/u appt confirmed? No    Are transportation arrangements needed? No   If their condition worsens, is the pt aware to call  their PCP or go to the ED? Yes  Was the patient provided with contact information for the PCP's office or ED? Yes  Was the pt encouraged to call back with questions or concerns? Yes

## 2019-10-22 NOTE — Progress Notes (Signed)
Discharge teaching provided. Meds, diet, activity, follow up appointments reviewed and all questions answered. Copy of instructions given to patient and pharmacy brought meds to bedside. Patient discharged home via wheelchair with daughter.

## 2019-10-22 NOTE — Care Management (Addendum)
[facility] health orders , face to face and PT note to [facility] at [place].  Spoke to [place] at [facility] regarding oxygen for CPAP at night. Patient will need to have another sleep study before this can be arranged. Spoke to Terri at [place]. Primary doctor will need to make referral, so PCP can follow. Dr Eliseo Squires aware.   Requested orders for HHPT/OT to fax to [facility] at [place]

## 2019-10-23 ENCOUNTER — Encounter: Payer: Self-pay | Admitting: Nurse Practitioner

## 2019-10-23 ENCOUNTER — Ambulatory Visit: Payer: Medicare Other | Admitting: Nurse Practitioner

## 2019-10-23 ENCOUNTER — Other Ambulatory Visit: Payer: Self-pay

## 2019-10-23 VITALS — BP 124/82 | HR 102 | Temp 97.5°F | Ht 60.0 in | Wt 103.8 lb

## 2019-10-23 DIAGNOSIS — G4733 Obstructive sleep apnea (adult) (pediatric): Secondary | ICD-10-CM

## 2019-10-23 DIAGNOSIS — K220 Achalasia of cardia: Secondary | ICD-10-CM | POA: Diagnosis not present

## 2019-10-23 DIAGNOSIS — K4090 Unilateral inguinal hernia, without obstruction or gangrene, not specified as recurrent: Secondary | ICD-10-CM

## 2019-10-23 DIAGNOSIS — M353 Polymyalgia rheumatica: Secondary | ICD-10-CM

## 2019-10-23 DIAGNOSIS — E876 Hypokalemia: Secondary | ICD-10-CM

## 2019-10-23 DIAGNOSIS — R609 Edema, unspecified: Secondary | ICD-10-CM

## 2019-10-23 DIAGNOSIS — R634 Abnormal weight loss: Secondary | ICD-10-CM | POA: Insufficient documentation

## 2019-10-23 DIAGNOSIS — K2100 Gastro-esophageal reflux disease with esophagitis, without bleeding: Secondary | ICD-10-CM

## 2019-10-23 DIAGNOSIS — I5031 Acute diastolic (congestive) heart failure: Secondary | ICD-10-CM | POA: Diagnosis not present

## 2019-10-23 DIAGNOSIS — R1319 Other dysphagia: Secondary | ICD-10-CM

## 2019-10-23 DIAGNOSIS — D696 Thrombocytopenia, unspecified: Secondary | ICD-10-CM

## 2019-10-23 DIAGNOSIS — L89151 Pressure ulcer of sacral region, stage 1: Secondary | ICD-10-CM

## 2019-10-23 DIAGNOSIS — R600 Localized edema: Secondary | ICD-10-CM

## 2019-10-23 DIAGNOSIS — R1310 Dysphagia, unspecified: Secondary | ICD-10-CM

## 2019-10-23 NOTE — Assessment & Plan Note (Signed)
hospital stay 10/18/19-10/22/19 for CHF-cough up frothy white phlegm, orthopnea, edema BLE, BNP 122, IV lasix, echocradiogram  EF 0000000, grade I diastolic dysfunction.  CXR bilateral effusions, on Furosemide 40mg  qd

## 2019-10-23 NOTE — Assessment & Plan Note (Signed)
Since 2012, continue Prednisone.

## 2019-10-23 NOTE — Assessment & Plan Note (Signed)
Continue CPAP, may need O2 at night.

## 2019-10-23 NOTE — Assessment & Plan Note (Signed)
Non blanchable redness in sacral area, the patient has foam cushion at home.

## 2019-10-23 NOTE — Assessment & Plan Note (Signed)
Significant, #130Ibs 10/18/19, #103Ibs today, CHF/fluid is contributory? The patient stated she doesn't sleep well at night, appetite is poor. The patient's son declined Mirtazapine today.

## 2019-10-23 NOTE — Assessment & Plan Note (Signed)
Hernia on the right, no problems, the patient was suggested to avoid constipation.

## 2019-10-23 NOTE — Assessment & Plan Note (Signed)
GERD, Achalasia of esophagus, s/p EGD, dilation, Botox inj. Purred food, continue Pepcid.

## 2019-10-23 NOTE — Assessment & Plan Note (Signed)
Continue Pepcid  

## 2019-10-23 NOTE — Assessment & Plan Note (Signed)
plt 100s.

## 2019-10-23 NOTE — Assessment & Plan Note (Signed)
Purred diet, risk for aspiration.

## 2019-10-23 NOTE — Assessment & Plan Note (Signed)
Continue Kcl po, repeat BMP 10/25/19

## 2019-10-23 NOTE — Patient Instructions (Signed)
BMP Thursday.

## 2019-10-23 NOTE — Assessment & Plan Note (Signed)
1+ edema BLE

## 2019-10-23 NOTE — Progress Notes (Signed)
Location:   clinic [place]   Place of Service:   clinic [place] Provider: Marlana Latus NP  Code Status: DNR Goals of Care:  Advanced Directives 10/18/2019  Does Patient Have a Medical Advance Directive? Yes  Type of Paramedic of [place];Living will  Does patient want to make changes to medical advance directive? -  Copy of [place] in Chart? -  Would patient like information on creating a medical advance directive? -  Pre-existing out of facility DNR order (yellow form or pink MOST form) -     Chief Complaint  Patient presents with  . Transitions Of Care    Patient was in the hospital for 5 days for CHF. She was discharged yesterday the 13th.     HPI: Patient is a 86 y.o. female seen today for an acute visit for f/u hospital stay 10/18/19-10/22/19 for CHF-cough up frothy white phlegm, orthopnea, edema BLE, BNP 122, IV lasix, echocradiogram  EF 0000000, grade I diastolic dysfunction.  CXR bilateral effusions, on Furosemide 40mg  qd, suggested f/u BMP, monitor weight, home health, palliative care. Hx of PMR on Prednisone ,  dysphagia diet/achalasia of esophagus, s/p dilation, on Pepcid,  sleep apnea, CPAP/O2 at night, pressure ulcer, sacral, stage I, thrombocytopenia, plt 100s,  Hypokalemia replete, on Kcl 25meq qd,  HTN, CHB w/p PM  Past Medical History:  Diagnosis Date  . Achalasia   . Anemia   . Arthritis    back  . Chronic steroid use   . Complete heart block ([place])    S/P PACEMAKER 2000 W/ GENERATOR CHANGE 2009  . Empyema, right ([place]) PULMOLOGIST-- DR CLANCE   VATS 06/23/2012 cultures negative to date CXR 07/19/12 persistent airfluid levels/  CXR 11-01-2012 IMPROVE RIGHT PLEURAL EFFUSION  . GERD (gastroesophageal reflux disease)   . History of aspiration pneumonitis    DEC 2013  . History of hiatal hernia   . Hypertension   . Inguinal hernia    right  . Intrinsic urethral sphincter deficiency   . Megaesophagus   . Mixed stress and urge  urinary incontinence   . Multinodular thyroid 06/26/2012   Multi nodular goiter. Large nodules in both lobes of the gland.  These nodules fit national criteria for fine needle aspiration  biopsy if not previously assessed.    . OSA on CPAP    cpap, doees not know settings  . Polymyalgia rheumatica (HCC)    on chronic Prednisone 5mg  daily  . RBBB   . S/P dilatation of esophageal stricture     Past Surgical History:  Procedure Laterality Date  . APPENDECTOMY  1953   w/ removal benign kidney tumor   . BALLOON DILATION  07/24/2012   Procedure: BALLOON DILATION;  Surgeon: Inda Castle, MD;  Location: [facility];  Service: Endoscopy;  Laterality: N/A;  . BALLOON DILATION N/A 12/03/2015   Procedure: BALLOON DILATION;  Surgeon: Mauri Pole, MD;  Location: [place] [facility];  Service: Endoscopy;  Laterality: N/A;  pnuematic balloon  . BALLOON DILATION N/A 03/16/2017   Procedure: BALLOON DILATION;  Surgeon: Mauri Pole, MD;  Location: [facility];  Service: Endoscopy;  Laterality: N/A;  PNUEMATIC BALLOONS  . BOTOX INJECTION  08/07/2012   Procedure: BOTOX INJECTION;  Surgeon: Inda Castle, MD;  Location: [facility];  Service: Endoscopy;  Laterality: N/A;  . BOTOX INJECTION N/A 05/24/2013   Procedure: MACROPLASTIQUE IMPLANT;  Surgeon: Irine Seal, MD;  Location: [facility];  Service: Urology;  Laterality:  N/A;  . BOTOX INJECTION  02/25/2014   Procedure: BOTOX INJECTION;  Surgeon: Inda Castle, MD;  Location: [facility];  Service: Endoscopy;;  . CARDIAC PACEMAKER PLACEMENT  06/1999  DR RUTH GREENFIELD AT [place]  ( LAST PACER CHECK 05-09-2013) for CHB/   END-OF-LIFE GENERATOR CHANGE  2009  . CATARACT EXTRACTION W/ INTRAOCULAR LENS  IMPLANT, BILATERAL  2005  . DILATION AND CURETTAGE OF UTERUS    . ESOPHAGOGASTRODUODENOSCOPY  07/24/2012   Procedure: ESOPHAGOGASTRODUODENOSCOPY (EGD);  Surgeon: Inda Castle, MD;  Location: [facility];  Service:  Endoscopy;  Laterality: N/A;  . ESOPHAGOGASTRODUODENOSCOPY  08/07/2012   Procedure: ESOPHAGOGASTRODUODENOSCOPY (EGD);  Surgeon: Inda Castle, MD;  Location: [facility];  Service: Endoscopy;  Laterality: N/A;  . ESOPHAGOGASTRODUODENOSCOPY N/A 02/25/2014   Procedure: ESOPHAGOGASTRODUODENOSCOPY (EGD);  Surgeon: Inda Castle, MD;  Location: [facility];  Service: Endoscopy;  Laterality: N/A;  . ESOPHAGOGASTRODUODENOSCOPY N/A 03/16/2017   Procedure: ESOPHAGOGASTRODUODENOSCOPY (EGD);  Surgeon: Mauri Pole, MD;  Location[facility];  Service: Endoscopy;  Laterality: N/A;  . ESOPHAGOGASTRODUODENOSCOPY N/A 08/02/2018   Procedure: ESOPHAGOGASTRODUODENOSCOPY (EGD);  Surgeon: Mauri Pole, MD;  Location: [facility];  Service: Endoscopy;  Laterality: N/A;  . ESOPHAGOGASTRODUODENOSCOPY (EGD) WITH ESOPHAGEAL DILATION  06/27/2012   Procedure: ESOPHAGOGASTRODUODENOSCOPY (EGD) WITH ESOPHAGEAL DILATION;  Surgeon: Ladene Artist, MD,FACG;  Location: [place];  Service: Endoscopy;  Laterality: N/A;  . ESOPHAGOGASTRODUODENOSCOPY (EGD) WITH PROPOFOL N/A 10/09/2015   Procedure: ESOPHAGOGASTRODUODENOSCOPY (EGD) WITH PROPOFOL ( WITH BOTOX);  Surgeon: Milus Banister, MD;  Location: [facility];  Service: Endoscopy;  Laterality: N/A;  . ESOPHAGOGASTRODUODENOSCOPY (EGD) WITH PROPOFOL N/A 12/03/2015   Procedure: ESOPHAGOGASTRODUODENOSCOPY (EGD) WITH PROPOFOL;  Surgeon: Mauri Pole, MD;  Location: [place] [facility];  Service: Endoscopy;  Laterality: N/A;  . FOREIGN BODY REMOVAL  08/02/2018   Procedure: FOREIGN BODY REMOVAL;  Surgeon: Mauri Pole, MD;  Location: [facility];  Service: Endoscopy;;  . PACEMAKER GENERATOR CHANGE  12/13/2007   at [facility]  . TOE SURGERY  2013   left 3rd toe HAMMERTOE REPAIR  . TONSILLECTOMY  AS CHILD  . TOTAL HIP ARTHROPLASTY Right 2001  . VERICOSE VEIN LIGATION    . VIDEO ASSISTED THORACOSCOPY (VATS)/DECORTICATION  06/23/2012   Procedure: VIDEO ASSISTED  THORACOSCOPY (VATS)/DECORTICATION;  Surgeon: Grace Isaac, MD;  Location: [place];  Service: Thoracic;  Laterality: Right;  Marland Kitchen VIDEO ASSISTED THORACOSCOPY (VATS)/EMPYEMA     06/23/2012  . VIDEO BRONCHOSCOPY  06/23/2012   Procedure: VIDEO BRONCHOSCOPY;  Surgeon: Grace Isaac, MD;  Location: [facility] OR;  Service: Thoracic;  Laterality: N/A;    Allergies  Allergen Reactions  . Actonel [Risedronate Sodium] Other (See Comments)    Joint aches; rechallenged --caused joint aches  . Ivp Dye [Iodinated Diagnostic Agents] Nausea And Vomiting    Allergies as of 10/23/2019      Reactions   Actonel [risedronate Sodium] Other (See Comments)   Joint aches; rechallenged --caused joint aches   Ivp Dye [iodinated Diagnostic Agents] Nausea And Vomiting      Medication List       Accurate as of October 23, 2019  3:33 PM. If you have any questions, ask your nurse or doctor.        acetaminophen 325 MG tablet Commonly known as: TYLENOL Take 2 tablets (650 mg total) by mouth every 4 (four) hours as needed for headache or mild pain.   CALCIUM/VITAMIN D3/ADULT GUMMY PO Take 1 tablet by mouth See admin instructions.  500 mg calcium, 1000 iu vitamin D3, Phosphorous 230mg . 3 daily   [employer] JO[employer] HEALTH TRIPLE ACTION PO Take 1 tablet by mouth daily.   famotidine 20 MG tablet Commonly known as: PEPCID Take 1 tablet (20 mg total) by mouth daily.   furosemide 40 MG tablet Commonly known as: LASIX Take 1 tablet (40 mg total) by mouth daily.   HM MULTIVITAMIN ADULT GUMMY PO Take 3 tablets by mouth daily.   OCUVITE EYE + MULTI PO Take 1 tablet by mouth daily.   potassium chloride 20 MEQ packet Commonly known as: KLOR-CON Take 20 mEq by mouth daily.   predniSONE 5 MG tablet Commonly known as: DELTASONE Take 1 tablet (5 mg total) by mouth daily.       Review of Systems:  Review of Systems  Constitutional: Positive for appetite change, fatigue and unexpected weight change. Negative for activity  change, chills and diaphoresis.       Weight loss  HENT: Positive for hearing loss and trouble swallowing. Negative for congestion and voice change.   Eyes: Negative for visual disturbance.  Respiratory: Positive for cough and shortness of breath. Negative for chest tightness and wheezing.        DOE  Cardiovascular: Positive for leg swelling. Negative for chest pain and palpitations.  Gastrointestinal: Negative for abdominal distention, abdominal pain, constipation, diarrhea, nausea and vomiting.  Genitourinary: Positive for frequency. Negative for difficulty urinating and dysuria.       Urination 3x/night.   Musculoskeletal: Positive for arthralgias, gait problem and myalgias.  Skin: Negative for color change and pallor.  Neurological: Negative for dizziness, speech difficulty, weakness and headaches.  Psychiatric/Behavioral: Positive for sleep disturbance. Negative for agitation and behavioral problems.       Thoughts interfere her night sleep.     Health Maintenance  Topic Date Due  . INFLUENZA VACCINE  02/10/2020  . TETANUS/TDAP  11/04/2025  . DEXA SCAN  Completed  . PNA vac Low Risk Adult  Completed    Physical Exam: Vitals:   10/23/19 1424  BP: 124/82  Pulse: (!) 102  Temp: (!) 97.5 F (36.4 C)  SpO2: 94%  Weight: 103 lb 12.8 oz (47.1 kg)  Height: 5' (1.524 m)   Body mass index is 20.27 kg/m. Physical Exam Vitals and nursing note reviewed.  Constitutional:      General: She is not in acute distress.    Appearance: Normal appearance. She is normal weight. She is not ill-appearing, toxic-appearing or diaphoretic.  HENT:     Head: Normocephalic and atraumatic.     Nose: Nose normal.     Mouth/Throat:     Mouth: Mucous membranes are moist.  Eyes:     Extraocular Movements: Extraocular movements intact.     Conjunctiva/sclera: Conjunctivae normal.     Pupils: Pupils are equal, round, and reactive to light.  Cardiovascular:     Rate and Rhythm: Normal rate and  regular rhythm.     Heart sounds: No murmur.     Comments: Pace maker Pulmonary:     Breath sounds: No wheezing, rhonchi or rales.  Abdominal:     General: Bowel sounds are normal. There is no distension.     Palpations: Abdomen is soft.     Tenderness: There is no abdominal tenderness. There is no guarding or rebound.     Hernia: A hernia is present.     Comments: Right inguinal hernia.   Musculoskeletal:     Cervical back: Normal range of motion  and neck supple.     Right lower leg: Edema present.     Left lower leg: Edema present.     Comments: Trace to 1+ edema BLE  Skin:    General: Skin is warm and dry.     Comments: No blanchable redness in sacral/coccyx area.   Neurological:     General: No focal deficit present.     Mental Status: She is alert and oriented to person, place, and time. Mental status is at baseline.     Motor: No weakness.     Coordination: Coordination normal.     Gait: Gait abnormal.  Psychiatric:        Behavior: Behavior normal.        Thought Content: Thought content normal.        Judgment: Judgment normal.     Comments: Sad facial looks.      Labs reviewed: Basic Metabolic Panel: Recent Labs    02/26/19 0000 05/03/19 0830 10/18/19 1211 10/19/19 0123 10/20/19 0610 10/21/19 0126 10/22/19 0113  NA 144   < >  --    < > 143 142 141  K 3.9   < >  --    < > 3.2* 4.2 4.0  CL 108   < >  --    < > 100 97* 98  CO2 29   < >  --    < > 32 35* 33*  GLUCOSE 117*   < >  --    < > 116* 127* 125*  BUN 28*   < >  --    < > 27* 29* 26*  CREATININE 0.58*   < >  --    < > 0.71 0.69 0.60  CALCIUM 9.8   < >  --    < > 9.4 10.0 9.7  MG  --   --   --   --  2.0  --   --   TSH 0.62  --  0.517  --   --   --   --    < > = values in this interval not displayed.   Liver Function Tests: Recent Labs    05/03/19 0830 08/10/19 0934 10/03/19 0924  AST 14 12 13   ALT 7 6 8   BILITOT 0.4 0.4 0.4  PROT 5.9* 6.4 6.2   No results for input(s): LIPASE, AMYLASE in  the last 8760 hours. No results for input(s): AMMONIA in the last 8760 hours. CBC: Recent Labs    06/04/19 1502 06/04/19 1502 08/10/19 0934 08/10/19 0934 10/03/19 0924 10/18/19 0047 10/19/19 0123 10/20/19 0610 10/21/19 0126  WBC 7.3   < > 7.4   < > 5.0   < > 11.1* 9.9 10.0  NEUTROABS 4,008  --  4,381  --  2,785  --   --   --   --   HGB 10.1*   < > 9.7*   < > 11.3*   < > 13.1 12.1 13.4  HCT 32.6*   < > 29.8*   < > 34.5*   < > 41.5 38.3 42.6  MCV 91.3   < > 96.8   < > 96.1   < > 100.2* 101.6* 100.9*  PLT 189   < > 183   < > 137*   < > 98* 94* 105*   < > = values in this interval not displayed.   Lipid Panel: Recent Labs    02/26/19 0000  CHOL 129  HDL 39*  LDLCALC 76  TRIG 65  CHOLHDL 3.3   Lab Results  Component Value Date   HGBA1C 5.4 02/26/2019    Procedures since last visit: DG Chest 2 View  Result Date: 10/18/2019 CLINICAL DATA:  Cough and shortness of breath. EXAM: CHEST - 2 VIEW COMPARISON:  October 09, 2015 FINDINGS: There is a dual lead AICD. The lungs are hyperinflated. Mild chronic appearing increased lung markings are seen without evidence of acute infiltrate. There is mild blunting of the bilateral costophrenic angles. No pneumothorax is seen. The heart size and mediastinal contours are within normal limits. The there is a large hiatal hernia with a markedly dilated esophagus. This is stable in appearance when compared to the prior exam. Degenerative changes are noted throughout the thoracic spine. IMPRESSION: 1. Chronic appearing increased lung markings without evidence of an acute infiltrate. 2. Large hiatal hernia with a markedly dilated esophagus, unchanged in appearance when compared to the prior study dated October 09, 2015. 3. Small bilateral pleural effusions. Electronically Signed   By: Virgina Norfolk M.D.   On: 10/18/2019 01:16   ECHOCARDIOGRAM COMPLETE  Result Date: 10/18/2019    ECHOCARDIOGRAM REPORT   Patient Name:   [name] Date of Exam:  10/18/2019 Medical Rec #:  [id number]          Height:       60.0 in Accession #:    [id number]         Weight:       130.0 lb Date of Birth:  [birth year]           BSA:          1.554 m Patient Age:    73 years           BP:           116/61 mmHg Patient Gender: F                  HR:           69 bpm. Exam Location:  Inpatient Procedure: 2D Echo, Color Doppler and Cardiac Doppler Indications:    [id number] Acute diastolic (congestive) heart failure  History:        Patient has prior history of Echocardiogram examinations, most                 recent 06/27/2012. CHF, Pacemaker; Risk Factors:Sleep Apnea and                 Hypertension. Prior echo performed at [facility].  Sonographer:    Raquel Sarna Senior RDCS Referring Phys: [phone] RONDELL A SMITH  Sonographer Comments: Very technically difficult due to patient body habitus. IMPRESSIONS  1. Left ventricular ejection fraction, by estimation, is 55 to 60%. The left ventricle has normal function. The left ventricle has no regional wall motion abnormalities. Left ventricular diastolic parameters are consistent with Grade I diastolic dysfunction (impaired relaxation). Elevated left atrial pressure.  2. Right ventricular systolic function is normal. The right ventricular size is normal. There is mildly elevated pulmonary artery systolic pressure. The estimated right ventricular systolic pressure is 99991111 mmHg.  3. Right atrial size was mildly dilated.  4. The mitral valve is normal in structure. No evidence of mitral valve regurgitation. No evidence of mitral stenosis.  5. The aortic valve was not well visualized. Aortic valve regurgitation is not visualized. Mild to moderate aortic valve sclerosis/calcification is present, without any evidence of aortic stenosis.  6. The inferior vena cava is normal  in size with greater than 50% respiratory variability, suggesting right atrial pressure of 3 mmHg. FINDINGS  Left Ventricle: Left ventricular ejection fraction, by estimation, is 55 to 60%.  The left ventricle has normal function. The left ventricle has no regional wall motion abnormalities. The left ventricular internal cavity size was normal in size. There is  no left ventricular hypertrophy. Left ventricular diastolic parameters are consistent with Grade I diastolic dysfunction (impaired relaxation). Elevated left atrial pressure. Right Ventricle: The right ventricular size is normal. No increase in right ventricular wall thickness. Right ventricular systolic function is normal. There is mildly elevated pulmonary artery systolic pressure. The tricuspid regurgitant velocity is 2.74  m/s, and with an assumed right atrial pressure of 8 mmHg, the estimated right ventricular systolic pressure is 99991111 mmHg. Left Atrium: Left atrial size was normal in size. Right Atrium: Right atrial size was mildly dilated. Pericardium: There is no evidence of pericardial effusion. Mitral Valve: The mitral valve is normal in structure. Normal mobility of the mitral valve leaflets. No evidence of mitral valve regurgitation. No evidence of mitral valve stenosis. Tricuspid Valve: The tricuspid valve is normal in structure. Tricuspid valve regurgitation is not demonstrated. No evidence of tricuspid stenosis. Aortic Valve: The aortic valve was not well visualized. Aortic valve regurgitation is not visualized. Mild to moderate aortic valve sclerosis/calcification is present, without any evidence of aortic stenosis. Pulmonic Valve: The pulmonic valve was normal in structure. Pulmonic valve regurgitation is not visualized. No evidence of pulmonic stenosis. Aorta: The aortic root is normal in size and structure. Venous: The inferior vena cava is normal in size with greater than 50% respiratory variability, suggesting right atrial pressure of 3 mmHg. IAS/Shunts: No atrial level shunt detected by color flow Doppler. Additional Comments: A pacer wire is visualized.  LEFT VENTRICLE PLAX 2D LVIDd:         2.31 cm  Diastology LVIDs:          1.69 cm  LV e' lateral:   3.81 cm/s LV PW:         1.14 cm  LV E/e' lateral: 25.2 LV IVS:        1.01 cm  LV e' medial:    3.92 cm/s LVOT diam:     2.00 cm  LV E/e' medial:  24.5 LV SV:         61 LV SV Index:   39 LVOT Area:     3.14 cm  RIGHT VENTRICLE RV S prime:     18.60 cm/s TAPSE (M-mode): 1.8 cm LEFT ATRIUM           Index       RIGHT ATRIUM           Index LA diam:      1.80 cm 1.16 cm/m  RA Area:     18.90 cm LA Vol (A4C): 21.9 ml 14.09 ml/m RA Volume:   50.40 ml  32.43 ml/m  AORTIC VALVE LVOT Vmax:   90.70 cm/s LVOT Vmean:  57.700 cm/s LVOT VTI:    0.193 m  AORTA Ao Root diam: 3.20 cm MITRAL VALVE                TRICUSPID VALVE MV Area (PHT): 2.91 cm     TR Peak grad:   30.0 mmHg MV Decel Time: 261 msec     TR Vmax:        274.00 cm/s MV E velocity: 96.10 cm/s MV A velocity: 168.00 cm/s  SHUNTS MV  E/A ratio:  0.57         Systemic VTI:  0.19 m                             Systemic Diam: 2.00 cm Candee Furbish MD Electronically signed by Candee Furbish MD Signature Date/Time: 10/18/2019/1:53:50 PM    Final     Assessment/Plan CHF (congestive heart failure) [facility]) hospital stay 10/18/19-10/22/19 for CHF-cough up frothy white phlegm, orthopnea, edema BLE, BNP 122, IV lasix, echocradiogram  EF 0000000, grade I diastolic dysfunction.  CXR bilateral effusions, on Furosemide 40mg  qd  Sleep apnea Continue CPAP, may need O2 at night.   Achalasia of esophagus GERD, Achalasia of esophagus, s/p EGD, dilation, Botox inj. Purred food, continue Pepcid.   Reflux esophagitis Continue Pepcid.   Esophageal dysphagia Purred diet, risk for aspiration.   Pressure injury of skin Non blanchable redness in sacral area, the patient has foam cushion at home.   Edema 1+ edema BLE  Hypokalemia Continue Kcl po, repeat BMP 10/25/19  Polymyalgia rheumatica ([place]) Since 2012, continue Prednisone.   Right inguinal hernia Hernia on the right, no problems, the patient was suggested to avoid constipation.    Thrombocytopenia (HCC) plt 100s.   Weight loss Significant, #130Ibs 10/18/19, #103Ibs today, CHF/fluid is contributory? The patient stated she doesn't sleep well at night, appetite is poor. The patient's son declined Mirtazapine today.     Labs/tests ordered: none  Next appt:  11/29/2019

## 2019-10-25 ENCOUNTER — Other Ambulatory Visit: Payer: Self-pay

## 2019-10-25 DIAGNOSIS — M353 Polymyalgia rheumatica: Secondary | ICD-10-CM

## 2019-10-25 DIAGNOSIS — R600 Localized edema: Secondary | ICD-10-CM

## 2019-10-25 DIAGNOSIS — D649 Anemia, unspecified: Secondary | ICD-10-CM

## 2019-11-06 LAB — COMPLETE METABOLIC PANEL WITH GFR
AG Ratio: 1.3 (calc) (ref 1.0–2.5)
ALT: 12 U/L (ref 6–29)
AST: 18 U/L (ref 10–35)
Albumin: 3.5 g/dL — ABNORMAL LOW (ref 3.6–5.1)
Alkaline phosphatase (APISO): 59 U/L (ref 37–153)
BUN/Creatinine Ratio: 49 (calc) — ABNORMAL HIGH (ref 6–22)
BUN: 30 mg/dL — ABNORMAL HIGH (ref 7–25)
CO2: 33 mmol/L — ABNORMAL HIGH (ref 20–32)
Calcium: 9.6 mg/dL (ref 8.6–10.4)
Chloride: 101 mmol/L (ref 98–110)
Creat: 0.61 mg/dL (ref 0.60–0.88)
GFR, Est African American: 94 mL/min/{1.73_m2} (ref >=60)
GFR, Est Non African American: 81 mL/min/{1.73_m2} (ref >=60)
Globulin: 2.8 g/dL (calc) (ref 1.9–3.7)
Glucose, Bld: 92 mg/dL (ref 65–99)
Potassium: 3.4 mmol/L — ABNORMAL LOW (ref 3.5–5.3)
Sodium: 141 mmol/L (ref 135–146)
Total Bilirubin: 0.6 mg/dL (ref 0.2–1.2)
Total Protein: 6.3 g/dL (ref 6.1–8.1)

## 2019-11-06 LAB — CBC WITH DIFFERENTIAL/PLATELET
Absolute Monocytes: 419 cells/uL (ref 200–950)
Basophils Absolute: 48 cells/uL (ref 0–200)
Basophils Relative: 0.9 %
Eosinophils Absolute: 48 cells/uL (ref 15–500)
Eosinophils Relative: 0.9 %
HCT: 38.2 % (ref 35.0–45.0)
Hemoglobin: 12.6 g/dL (ref 11.7–15.5)
Lymphs Abs: 2253 cells/uL (ref 850–3900)
MCH: 31.9 pg (ref 27.0–33.0)
MCHC: 33 g/dL (ref 32.0–36.0)
MCV: 96.7 fL (ref 80.0–100.0)
MPV: 11.1 fL (ref 7.5–12.5)
Monocytes Relative: 7.9 %
Neutro Abs: 2533 cells/uL (ref 1500–7800)
Neutrophils Relative %: 47.8 %
Platelets: 109 10*3/uL — ABNORMAL LOW (ref 140–400)
RBC: 3.95 10*6/uL (ref 3.80–5.10)
RDW: 15.1 % — ABNORMAL HIGH (ref 11.0–15.0)
Total Lymphocyte: 42.5 %
WBC: 5.3 10*3/uL (ref 3.8–10.8)

## 2019-11-06 LAB — C-REACTIVE PROTEIN: CRP: 8.5 mg/L — ABNORMAL HIGH (ref <8.0)

## 2019-11-06 LAB — SEDIMENTATION RATE: Sed Rate: 2 mm/h (ref 0–30)

## 2019-11-09 ENCOUNTER — Telehealth: Payer: Self-pay

## 2019-11-09 NOTE — Telephone Encounter (Signed)
Unable to speak  with patient to remind of missed remote transmission 

## 2019-11-13 ENCOUNTER — Non-Acute Institutional Stay: Payer: Medicare Other | Admitting: Internal Medicine

## 2019-11-13 ENCOUNTER — Encounter: Payer: Self-pay | Admitting: Internal Medicine

## 2019-11-13 DIAGNOSIS — R600 Localized edema: Secondary | ICD-10-CM

## 2019-11-13 DIAGNOSIS — M810 Age-related osteoporosis without current pathological fracture: Secondary | ICD-10-CM

## 2019-11-13 DIAGNOSIS — M353 Polymyalgia rheumatica: Secondary | ICD-10-CM

## 2019-11-13 DIAGNOSIS — R1319 Other dysphagia: Secondary | ICD-10-CM

## 2019-11-13 DIAGNOSIS — R1310 Dysphagia, unspecified: Secondary | ICD-10-CM | POA: Diagnosis not present

## 2019-11-13 NOTE — Progress Notes (Signed)
Location:   [place] Room Number: [place] of Service:  ALF [phone]) Provider:  Veleta Miners MD   Jill Dad, MD  Patient Care Team: Jill Dad, MD as PCP - General (Internal Medicine) Melina Modena, [facility] [name], MD as Consulting Physician (Cardiology) Irine Seal, MD as Attending Physician (Urology) Clance, Armando Reichert, MD as Consulting Physician (Pulmonary Disease) Grace Isaac, MD as Consulting Physician (Cardiothoracic Surgery) Mauri Pole, MD as Consulting Physician (Gastroenterology)  Extended Emergency Contact Information Primary Emergency Contact: Burdell,Francis Address: [address]          [place], [place] [address] [place] Phone: [phone] Relation: Spouse Secondary Emergency Contact: Brenneman,Dorothy Address: [address]          [place], [place] [address] [place] of [place] Phone: [phone] Mobile Phone: [phone] Relation: Daughter  Code Status:  DNR Goals of care: Advanced Directive information Advanced Directives 11/13/2019  Does Patient Have a Medical Advance Directive? Yes  Type of Paramedic of [place];Living will;Out of facility DNR (pink MOST or yellow form)  Does patient want to make changes to medical advance directive? No - Patient declined  Copy of [place] in Chart? Yes - validated most recent copy scanned in chart (See row information)  Would patient like information on creating a medical advance directive? -  Pre-existing out of facility DNR order (yellow form or pink MOST form) Yellow form placed in chart (order not valid for inpatient use);Pink MOST form placed in chart (order not valid for inpatient use)     Chief Complaint  Patient presents with  . Medical Management of Chronic Issues    HPI:  Pt is a 86 y.o. female seen today for medical management of chronic diseases.   New Admit to AL  Her  Issues PMR On Prednisone stable Achalasia Per GI repeat Barium was negative for any change from last year  GI said on Puree diet  Visual Hallucinations Continues to have them but less then Before. Thought to be due to Prednisone. Had d/w family. Hold MRI and Neurology consult unless gets worse CHF Recently needed Admission due to Diastolic CHF Doing well right now with her lasix EF was 55-60%   Has adjusted well to AL. Had no complains. Is going to work with therapy Past Medical History:  Diagnosis Date  . Achalasia   . Anemia   . Arthritis    back  . Chronic steroid use   . Complete heart block ([place])    S/P PACEMAKER 2000 W/ GENERATOR CHANGE 2009  . Empyema, right ([place]) PULMOLOGIST-- DR CLANCE   VATS 06/23/2012 cultures negative to date CXR 07/19/12 persistent airfluid levels/  CXR 11-01-2012 IMPROVE RIGHT PLEURAL EFFUSION  . GERD (gastroesophageal reflux disease)   . History of aspiration pneumonitis    DEC 2013  . History of hiatal hernia   . Hypertension   . Inguinal hernia    right  . Intrinsic urethral sphincter deficiency   . Megaesophagus   . Mixed stress and urge urinary incontinence   . Multinodular thyroid 06/26/2012   Multi nodular goiter. Large nodules in both lobes of the gland.  These nodules fit national criteria for fine needle aspiration  biopsy if not previously assessed.    . OSA on CPAP    cpap, doees not know settings  . Polymyalgia rheumatica (HCC)    on chronic Prednisone 5mg  daily  .  RBBB   . S/P dilatation of esophageal stricture    Past Surgical History:  Procedure Laterality Date  . APPENDECTOMY  1953   w/ removal benign kidney tumor   . BALLOON DILATION  07/24/2012   Procedure: BALLOON DILATION;  Surgeon: Inda Castle, MD;  Location: [facility];  Service: Endoscopy;  Laterality: N/A;  . BALLOON DILATION N/A 12/03/2015   Procedure: BALLOON DILATION;  Surgeon: Mauri Pole, MD;  Location: [facility];  Service: Endoscopy;   Laterality: N/A;  pnuematic balloon  . BALLOON DILATION N/A 03/16/2017   Procedure: BALLOON DILATION;  Surgeon: Mauri Pole, MD;  Location: [facility];  Service: Endoscopy;  Laterality: N/A;  PNUEMATIC BALLOONS  . BOTOX INJECTION  08/07/2012   Procedure: BOTOX INJECTION;  Surgeon: Inda Castle, MD;  Location: [facility];  Service: Endoscopy;  Laterality: N/A;  . BOTOX INJECTION N/A 05/24/2013   Procedure: MACROPLASTIQUE IMPLANT;  Surgeon: Irine Seal, MD;  Location: [facility];  Service: Urology;  Laterality: N/A;  . BOTOX INJECTION  02/25/2014   Procedure: BOTOX INJECTION;  Surgeon: Inda Castle, MD;  Location: [facility];  Service: Endoscopy;;  . CARDIAC PACEMAKER PLACEMENT  06/1999  DR RUTH GREENFIELD AT [facility]  ( LAST PACER CHECK 05-09-2013) for CHB/   END-OF-LIFE GENERATOR CHANGE  2009  . CATARACT EXTRACTION W/ INTRAOCULAR LENS  IMPLANT, BILATERAL  2005  . DILATION AND CURETTAGE OF UTERUS    . ESOPHAGOGASTRODUODENOSCOPY  07/24/2012   Procedure: ESOPHAGOGASTRODUODENOSCOPY (EGD);  Surgeon: Inda Castle, MD;  Location: [facility];  Service: Endoscopy;  Laterality: N/A;  . ESOPHAGOGASTRODUODENOSCOPY  08/07/2012   Procedure: ESOPHAGOGASTRODUODENOSCOPY (EGD);  Surgeon: Inda Castle, MD;  Location: [facility];  Service: Endoscopy;  Laterality: N/A;  . ESOPHAGOGASTRODUODENOSCOPY N/A 02/25/2014   Procedure: ESOPHAGOGASTRODUODENOSCOPY (EGD);  Surgeon: Inda Castle, MD;  Location: [facility];  Service: Endoscopy;  Laterality: N/A;  . ESOPHAGOGASTRODUODENOSCOPY N/A 03/16/2017   Procedure: ESOPHAGOGASTRODUODENOSCOPY (EGD);  Surgeon: Mauri Pole, MD;  Location: [facility];  Service: Endoscopy;  Laterality: N/A;  . ESOPHAGOGASTRODUODENOSCOPY N/A 08/02/2018   Procedure: ESOPHAGOGASTRODUODENOSCOPY (EGD);  Surgeon: Mauri Pole, MD;  Location: [facility];  Service: Endoscopy;  Laterality: N/A;  . ESOPHAGOGASTRODUODENOSCOPY (EGD) WITH  ESOPHAGEAL DILATION  06/27/2012   Procedure: ESOPHAGOGASTRODUODENOSCOPY (EGD) WITH ESOPHAGEAL DILATION;  Surgeon: Ladene Artist, MD,FACG;  Location: [facility];  Service: Endoscopy;  Laterality: N/A;  . ESOPHAGOGASTRODUODENOSCOPY (EGD) WITH PROPOFOL N/A 10/09/2015   Procedure: ESOPHAGOGASTRODUODENOSCOPY (EGD) WITH PROPOFOL ( WITH BOTOX);  Surgeon: Milus Banister, MD;  Location: [facility];  Service: Endoscopy;  Laterality: N/A;  . ESOPHAGOGASTRODUODENOSCOPY (EGD) WITH PROPOFOL N/A 12/03/2015   Procedure: ESOPHAGOGASTRODUODENOSCOPY (EGD) WITH PROPOFOL;  Surgeon: Mauri Pole, MD;  Location: [facility];  Service: Endoscopy;  Laterality: N/A;  . FOREIGN BODY REMOVAL  08/02/2018   Procedure: FOREIGN BODY REMOVAL;  Surgeon: Mauri Pole, MD;  Location: [facility];  Service: Endoscopy;;  . PACEMAKER GENERATOR CHANGE  12/13/2007   at [facility]  . TOE SURGERY  2013   left 3rd toe HAMMERTOE REPAIR  . TONSILLECTOMY  AS CHILD  . TOTAL HIP ARTHROPLASTY Right 2001  . VERICOSE VEIN LIGATION    . VIDEO ASSISTED THORACOSCOPY (VATS)/DECORTICATION  06/23/2012   Procedure: VIDEO ASSISTED THORACOSCOPY (VATS)/DECORTICATION;  Surgeon: Grace Isaac, MD;  Location: [facility];  Service: Thoracic;  Laterality: Right;  Marland Kitchen VIDEO ASSISTED THORACOSCOPY (VATS)/EMPYEMA     06/23/2012  . VIDEO BRONCHOSCOPY  06/23/2012  Procedure: VIDEO BRONCHOSCOPY;  Surgeon: Grace Isaac, MD;  [facility];  Service: Thoracic;  Laterality: N/A;    Allergies  Allergen Reactions  . Actonel [Risedronate Sodium] Other (See Comments)    Joint aches; rechallenged --caused joint aches  . Ivp Dye [Iodinated Diagnostic Agents] Nausea And Vomiting    Allergies as of 11/13/2019      Reactions   Actonel [risedronate Sodium] Other (See Comments)   Joint aches; rechallenged --caused joint aches   Ivp Dye [iodinated Diagnostic Agents] Nausea And Vomiting      Medication List       Accurate as of Nov 13, 2019 10:31 AM.  If you have any questions, ask your nurse or doctor.        acetaminophen 325 MG tablet Commonly known as: TYLENOL Take 2 tablets (650 mg total) by mouth every 4 (four) hours as needed for headache or mild pain.   CALCIUM/VITAMIN D3/ADULT GUMMY PO Take 1 tablet by mouth See admin instructions. 500 mg calcium, 1000 iu vitamin D3, Phosphorous 230mg . 3 daily   [employer] JOINT HEALTH TRIPLE ACTION PO Take 1 tablet by mouth daily.   famotidine 20 MG tablet Commonly known as: PEPCID Take 1 tablet (20 mg total) by mouth daily.   furosemide 40 MG tablet Commonly known as: LASIX Take 1 tablet (40 mg total) by mouth daily.   HM MULTIVITAMIN ADULT GUMMY PO Take 3 tablets by mouth daily.   OCUVITE EYE + MULTI PO Take 1 tablet by mouth daily.   NON FORMULARY Iron plus Vitamin C tablet; 65mg /125mg ; amt: 65mg /125mg ; oral Once A Day   NON FORMULARY Heal and Soothe capsule; 300 mg; amt: 900 mg; oral Once A Day   nystatin powder Generic drug: nystatin Apply 1 application topically 2 (two) times daily.   potassium chloride SA 20 MEQ tablet Commonly known as: KLOR-CON Take 40 mEq by mouth daily. Dilute tablets in 71ml of water to dissolve   predniSONE 5 MG tablet Commonly known as: DELTASONE Take 1 tablet (5 mg total) by mouth daily.       Review of Systems  Constitutional: Positive for activity change.  HENT: Negative.   Respiratory: Negative.   Cardiovascular: Positive for leg swelling.  Gastrointestinal: Negative.   Genitourinary: Negative.   Musculoskeletal: Positive for gait problem.  Skin: Negative.   Neurological: Positive for weakness.  Psychiatric/Behavioral: Positive for hallucinations.    Immunization History  Administered Date(s) Administered  . Influenza Split 09/11/2012, 04/12/2013, 04/30/2015  . Influenza, High Dose Seasonal PF 04/18/2017, 04/25/2019  . Influenza,inj,Quad PF,6+ Mos 04/13/2018  . Influenza-Unspecified 09/19/2012, 05/03/2014, 04/10/2015,  04/22/2016, 04/20/2017  . Moderna SARS-COVID-2 Vaccination 07/16/2019, 08/13/2019  . Pneumococcal Conjugate-13 04/16/2015  . Pneumococcal Polysaccharide-23 07/12/2004  . Td 01/10/2012  . Tdap 11/05/2015   Pertinent  Health Maintenance Due  Topic Date Due  . INFLUENZA VACCINE  02/10/2020  . DEXA SCAN  Completed  . PNA vac Low Risk Adult  Completed   Fall Risk  10/23/2019 10/10/2019 09/12/2019 08/15/2019 07/18/2019  Falls in the past year? 0 0 1 0 0  Comment - - - - -  Number falls in past yr: 0 0 1 0 0  Comment - - - - -  Injury with Fall? - - 0 - -   Functional Status Survey:    Vitals:   11/13/19 1024  BP: 112/72  Pulse: 76  Resp: 20  Temp: 97.6 F (36.4 C)  SpO2: 96%  Weight: 105 lb 3.2 oz (  47.7 kg)  Height: 5' (1.524 m)   Body mass index is 20.55 kg/m. Physical Exam Vitals reviewed.  Constitutional:      Appearance: Normal appearance.  HENT:     Head: Normocephalic.     Nose: Nose normal.     Mouth/Throat:     Mouth: Mucous membranes are moist.     Pharynx: Oropharynx is clear.  Eyes:     Pupils: Pupils are equal, round, and reactive to light.  Cardiovascular:     Rate and Rhythm: Normal rate.     Pulses: Normal pulses.     Heart sounds: Normal heart sounds.  Pulmonary:     Effort: Pulmonary effort is normal.     Breath sounds: Normal breath sounds.  Abdominal:     General: Abdomen is flat. Bowel sounds are normal.     Palpations: Abdomen is soft.  Musculoskeletal:        General: Swelling present.     Cervical back: Neck supple.  Skin:    General: Skin is warm.  Neurological:     General: No focal deficit present.     Mental Status: She is alert and oriented to person, place, and time.  Psychiatric:        Mood and Affect: Mood normal.     Labs reviewed: Recent Labs    10/20/19 0610 10/20/19 0610 10/21/19 0126 10/22/19 0113 11/05/19 0748  NA 143   < > 142 141 141  K 3.2*   < > 4.2 4.0 3.4*  CL 100   < > 97* 98 101  CO2 32   < > 35* 33* 33*   GLUCOSE 116*   < > 127* 125* 92  BUN 27*   < > 29* 26* 30*  CREATININE 0.71   < > 0.69 0.60 0.61  CALCIUM 9.4   < > 10.0 9.7 9.6  MG 2.0  --   --   --   --    < > = values in this interval not displayed.   Recent Labs    08/10/19 0934 10/03/19 0924 11/05/19 0748  AST 12 13 18   ALT 6 8 12   BILITOT 0.4 0.4 0.6  PROT 6.4 6.2 6.3   Recent Labs    08/10/19 0934 08/10/19 0934 10/03/19 0924 10/18/19 0047 10/20/19 0610 10/21/19 0126 11/05/19 0748  WBC 7.4   < > 5.0   < > 9.9 10.0 5.3  NEUTROABS 4,381  --  2,785  --   --   --  2,533  HGB 9.7*   < > 11.3*   < > 12.1 13.4 12.6  HCT 29.8*   < > 34.5*   < > 38.3 42.6 38.2  MCV 96.8   < > 96.1   < > 101.6* 100.9* 96.7  PLT 183   < > 137*   < > 94* 105* 109*   < > = values in this interval not displayed.   Lab Results  Component Value Date   TSH 0.517 10/18/2019   Lab Results  Component Value Date   HGBA1C 5.4 02/26/2019   Lab Results  Component Value Date   CHOL 129 02/26/2019   HDL 39 (L) 02/26/2019   LDLCALC 76 02/26/2019   TRIG 65 02/26/2019   CHOLHDL 3.3 02/26/2019    Significant Diagnostic Results in last 30 days:  DG Chest 2 View  Result Date: 10/18/2019 CLINICAL DATA:  Cough and shortness of breath. EXAM: CHEST - 2 VIEW COMPARISON:  October 09, 2015 FINDINGS: There is a dual lead AICD. The lungs are hyperinflated. Mild chronic appearing increased lung markings are seen without evidence of acute infiltrate. There is mild blunting of the bilateral costophrenic angles. No pneumothorax is seen. The heart size and mediastinal contours are within normal limits. The there is a large hiatal hernia with a markedly dilated esophagus. This is stable in appearance when compared to the prior exam. Degenerative changes are noted throughout the thoracic spine. IMPRESSION: 1. Chronic appearing increased lung markings without evidence of an acute infiltrate. 2. Large hiatal hernia with a markedly dilated esophagus, unchanged in  appearance when compared to the prior study dated October 09, 2015. 3. Small bilateral pleural effusions. Electronically Signed   By: Virgina Norfolk M.D.   On: 10/18/2019 01:16   ECHOCARDIOGRAM COMPLETE  Result Date: 10/18/2019    ECHOCARDIOGRAM REPORT   Patient Name:   [name] Date of Exam: 10/18/2019 Medical Rec #:  [id number]          Height:       60.0 in Accession #:    [id number]         Weight:       130.0 lb Date of Birth:  01-09-1929           BSA:          1.554 m Patient Age:    54 years           BP:           116/61 mmHg Patient Gender: F                  HR:           69 bpm. Exam Location:  Inpatient Procedure: 2D Echo, Color Doppler and Cardiac Doppler Indications:    [id number] Acute diastolic (congestive) heart failure  History:        Patient has prior history of Echocardiogram examinations, most                 recent 06/27/2012. CHF, Pacemaker; Risk Factors:Sleep Apnea and                 Hypertension. Prior echo performed at [facility].  Sonographer:    Raquel Sarna Senior RDCS Referring Phys: [phone] RONDELL A SMITH  Sonographer Comments: Very technically difficult due to patient body habitus. IMPRESSIONS  1. Left ventricular ejection fraction, by estimation, is 55 to 60%. The left ventricle has normal function. The left ventricle has no regional wall motion abnormalities. Left ventricular diastolic parameters are consistent with Grade I diastolic dysfunction (impaired relaxation). Elevated left atrial pressure.  2. Right ventricular systolic function is normal. The right ventricular size is normal. There is mildly elevated pulmonary artery systolic pressure. The estimated right ventricular systolic pressure is 99991111 mmHg.  3. Right atrial size was mildly dilated.  4. The mitral valve is normal in structure. No evidence of mitral valve regurgitation. No evidence of mitral stenosis.  5. The aortic valve was not well visualized. Aortic valve regurgitation is not visualized. Mild to moderate aortic  valve sclerosis/calcification is present, without any evidence of aortic stenosis.  6. The inferior vena cava is normal in size with greater than 50% respiratory variability, suggesting right atrial pressure of 3 mmHg. FINDINGS  Left Ventricle: Left ventricular ejection fraction, by estimation, is 55 to 60%. The left ventricle has normal function. The left ventricle has no regional wall motion abnormalities. The left ventricular internal cavity size was  normal in size. There is  no left ventricular hypertrophy. Left ventricular diastolic parameters are consistent with Grade I diastolic dysfunction (impaired relaxation). Elevated left atrial pressure. Right Ventricle: The right ventricular size is normal. No increase in right ventricular wall thickness. Right ventricular systolic function is normal. There is mildly elevated pulmonary artery systolic pressure. The tricuspid regurgitant velocity is 2.74  m/s, and with an assumed right atrial pressure of 8 mmHg, the estimated right ventricular systolic pressure is 99991111 mmHg. Left Atrium: Left atrial size was normal in size. Right Atrium: Right atrial size was mildly dilated. Pericardium: There is no evidence of pericardial effusion. Mitral Valve: The mitral valve is normal in structure. Normal mobility of the mitral valve leaflets. No evidence of mitral valve regurgitation. No evidence of mitral valve stenosis. Tricuspid Valve: The tricuspid valve is normal in structure. Tricuspid valve regurgitation is not demonstrated. No evidence of tricuspid stenosis. Aortic Valve: The aortic valve was not well visualized. Aortic valve regurgitation is not visualized. Mild to moderate aortic valve sclerosis/calcification is present, without any evidence of aortic stenosis. Pulmonic Valve: The pulmonic valve was normal in structure. Pulmonic valve regurgitation is not visualized. No evidence of pulmonic stenosis. Aorta: The aortic root is normal in size and structure. Venous: The  inferior vena cava is normal in size with greater than 50% respiratory variability, suggesting right atrial pressure of 3 mmHg. IAS/Shunts: No atrial level shunt detected by color flow Doppler. Additional Comments: A pacer wire is visualized.  LEFT VENTRICLE PLAX 2D LVIDd:         2.31 cm  Diastology LVIDs:         1.69 cm  LV e' lateral:   3.81 cm/s LV PW:         1.14 cm  LV E/e' lateral: 25.2 LV IVS:        1.01 cm  LV e' medial:    3.92 cm/s LVOT diam:     2.00 cm  LV E/e' medial:  24.5 LV SV:         61 LV SV Index:   39 LVOT Area:     3.14 cm  RIGHT VENTRICLE RV S prime:     18.60 cm/s TAPSE (M-mode): 1.8 cm LEFT ATRIUM           Index       RIGHT ATRIUM           Index LA diam:      1.80 cm 1.16 cm/m  RA Area:     18.90 cm LA Vol (A4C): 21.9 ml 14.09 ml/m RA Volume:   50.40 ml  32.43 ml/m  AORTIC VALVE LVOT Vmax:   90.70 cm/s LVOT Vmean:  57.700 cm/s LVOT VTI:    0.193 m  AORTA Ao Root diam: 3.20 cm MITRAL VALVE                TRICUSPID VALVE MV Area (PHT): 2.91 cm     TR Peak grad:   30.0 mmHg MV Decel Time: 261 msec     TR Vmax:        274.00 cm/s MV E velocity: 96.10 cm/s MV A velocity: 168.00 cm/s  SHUNTS MV E/A ratio:  0.57         Systemic VTI:  0.19 m                             Systemic Diam: 2.00 cm Candee Furbish MD  Electronically signed by Candee Furbish MD Signature Date/Time: 10/18/2019/1:53:50 PM    Final     Assessment/Plan Polymyalgia rheumatica ([place]) Continue prednisone for now Does not want to see Rheumatology right now  Weight loss Weight has stabilized Will follow here  Esophageal dysphagia On Puree diet  Bilateral leg edema On Lasix Repeat CMP Treated for Hypokalemia Anemia, unspecified type Hgb Normal now  Hallucinations Discussed in detail with son and husband in Last visit  Needs Neurology Follow up Also Needs CT scan of head  Will consider if they get worse   Age-related osteoporosis without current pathological fracture T Score in 2018was -2.9 Will  start on Prolia here   Back Pain after Fall Xray showed Compression Fracture Cannot do MRI due to Pacemaker Pain Control for now OSA Continues on CPAP S/P Pacemaker Follows with Cardiology Family/ staff Communication:   Labs/tests ordered:  CBC,CMP in 2 weeks  Total time spent in this patient care encounter was  45_  minutes; greater than 50% of the visit spent counseling patient and staff, reviewing records , Labs and coordinating care for problems addressed at this encounter.

## 2019-11-27 LAB — CBC: RBC: 3.8 — AB (ref 3.87–5.11)

## 2019-11-27 LAB — CBC AND DIFFERENTIAL
HCT: 37 (ref 36–46)
Hemoglobin: 12.2 (ref 12.0–16.0)
Platelets: 134 — AB (ref 150–399)
WBC: 5.9

## 2019-11-27 LAB — COMPREHENSIVE METABOLIC PANEL
Albumin: 3.4 — AB (ref 3.5–5.0)
Calcium: 9.6 (ref 8.7–10.7)
GFR calc Af Amer: 93
GFR calc non Af Amer: 80
Globulin: 2.5

## 2019-11-27 LAB — BASIC METABOLIC PANEL
BUN: 21 (ref 4–21)
CO2: 31 — AB (ref 13–22)
Chloride: 104 (ref 99–108)
Creatinine: 0.6 (ref 0.5–1.1)
Glucose: 93
Potassium: 3.8 (ref 3.4–5.3)
Sodium: 140 (ref 137–147)

## 2019-11-27 LAB — HEPATIC FUNCTION PANEL
ALT: 16 (ref 7–35)
AST: 16 (ref 13–35)
Alkaline Phosphatase: 59 (ref 25–125)
Bilirubin, Total: 0.5

## 2019-12-04 ENCOUNTER — Encounter: Payer: Self-pay | Admitting: Internal Medicine

## 2019-12-04 ENCOUNTER — Non-Acute Institutional Stay: Payer: Medicare Other | Admitting: Internal Medicine

## 2019-12-04 DIAGNOSIS — M810 Age-related osteoporosis without current pathological fracture: Secondary | ICD-10-CM

## 2019-12-04 DIAGNOSIS — R600 Localized edema: Secondary | ICD-10-CM | POA: Diagnosis not present

## 2019-12-04 DIAGNOSIS — R1310 Dysphagia, unspecified: Secondary | ICD-10-CM

## 2019-12-04 DIAGNOSIS — L89151 Pressure ulcer of sacral region, stage 1: Secondary | ICD-10-CM

## 2019-12-04 DIAGNOSIS — M353 Polymyalgia rheumatica: Secondary | ICD-10-CM | POA: Diagnosis not present

## 2019-12-04 DIAGNOSIS — R1319 Other dysphagia: Secondary | ICD-10-CM

## 2019-12-04 NOTE — Progress Notes (Signed)
Location:  [place] Room Number: 904-B Place of Service:  ALF [phone]) Provider:  [name], MD  Patient Care Team: [name], MD as PCP - General (Internal Medicine) Melina Modena, [facility] [name], MD as Consulting Physician (Cardiology) Irine Seal, MD as Attending Physician (Urology) Clance, Armando Reichert, MD as Consulting Physician (Pulmonary Disease) Grace Isaac, MD as Consulting Physician (Cardiothoracic Surgery) Mauri Pole, MD as Consulting Physician (Gastroenterology)  Extended Emergency Contact Information Primary Emergency Contact: Goar,Francis Address: [address]          [place], [place] [address] [place] Phone: [phone] Relation: Spouse Secondary Emergency Contact: Basquez,Dorothy Address: [address]          [place], [place] [address] [place] Phone: [phone] Mobile Phone: [phone] Relation: Daughter  Code Status:  DNR Goals of care: Advanced Directive information Advanced Directives 12/04/2019  Does Patient Have a Medical Advance Directive? Yes  Type of Paramedic of [place];Living will;Out of facility DNR (pink MOST or yellow form)  Does patient want to make changes to medical advance directive? No - Patient declined  Copy of [place] in Chart? Yes - validated most recent copy scanned in chart (See row information)  Would patient like information on creating a medical advance directive? -  Pre-existing out of facility DNR order (yellow form or pink MOST form) Pink MOST form placed in chart (order not valid for inpatient use);Yellow form placed in chart (order not valid for inpatient use)     Chief Complaint  Patient presents with  . Acute Visit    Patient is seen for weight gain and worsening edema.     HPI:  Pt is an 86 y.o. female seen today for an acute visit for Weight gain and Worsening LE  edema Patient has h/o PMR on Prednisone, Achalasia on Puree Diet, Visual Hallucinations And h/o Diastolic CHF with LE edema   Has Gained weight recently. Almost 10 lbs in past 1 week Also noticed to have more edema.  Denies any shortness of breath.  No dyspnea on exertion no chest pain. She has been eating well now and thinks that's why she has gained weight She also was c/o Pain in her Sacral area.  Past Medical History:  Diagnosis Date  . Achalasia   . Anemia   . Arthritis    back  . Chronic steroid use   . Complete heart block ([place])    S/P PACEMAKER 2000 W/ GENERATOR CHANGE 2009  . Empyema, right ([place]) PULMOLOGIST-- DR CLANCE   VATS 06/23/2012 cultures negative to date CXR 07/19/12 persistent airfluid levels/  CXR 11-01-2012 IMPROVE RIGHT PLEURAL EFFUSION  . GERD (gastroesophageal reflux disease)   . History of aspiration pneumonitis    DEC 2013  . History of hiatal hernia   . Hypertension   . Inguinal hernia    right  . Intrinsic urethral sphincter deficiency   . Megaesophagus   . Mixed stress and urge urinary incontinence   . Multinodular thyroid 06/26/2012   Multi nodular goiter. Large nodules in both lobes of the gland.  These nodules fit national criteria for fine needle aspiration  biopsy if not previously assessed.    . OSA on CPAP    cpap, doees not know settings  . Polymyalgia rheumatica (HCC)    on chronic Prednisone 5mg  daily  . RBBB   . S/P dilatation of  esophageal stricture    Past Surgical History:  Procedure Laterality Date  . APPENDECTOMY  1953   w/ removal benign kidney tumor   . BALLOON DILATION  07/24/2012   Procedure: BALLOON DILATION;  Surgeon: Inda Castle, MD;  Location: [facility];  Service: Endoscopy;  Laterality: N/A;  . BALLOON DILATION N/A 12/03/2015   Procedure: BALLOON DILATION;  Surgeon: Mauri Pole, MD;  Location: [facility] ENDOSCOPY;  Service: Endoscopy;  Laterality: N/A;  pnuematic balloon  . BALLOON DILATION N/A 03/16/2017    Procedure: BALLOON DILATION;  Surgeon: Mauri Pole, MD;  Location: [facility]COPY;  Service: Endoscopy;  Laterality: N/A;  PNUEMATIC BALLOONS  . BOTOX INJECTION  08/07/2012   Procedure: BOTOX INJECTION;  Surgeon: Inda Castle, MD;  Location: [facility]COPY;  Service: Endoscopy;  Laterality: N/A;  . BOTOX INJECTION N/A 05/24/2013   Procedure: MACROPLASTIQUE IMPLANT;  Surgeon: Irine Seal, MD;  Location[facility];  Service: Urology;  Laterality: N/A;  . BOTOX INJECTION  02/25/2014   Procedure: BOTOX INJECTION;  Surgeon: Inda Castle, MD;  Location: [facility]OSCOPY;  Service: Endoscopy;;  . CARDIAC PACEMAKER PLACEMENT  06/1999  DR RUTH GREENFIELD AT [facility]  ( LAST PACER CHECK 05-09-2013) for CHB/   END-OF-LIFE GENERATOR CHANGE  2009  . CATARACT EXTRACTION W/ INTRAOCULAR LENS  IMPLANT, BILATERAL  2005  . DILATION AND CURETTAGE OF UTERUS    . ESOPHAGOGASTRODUODENOSCOPY  07/24/2012   Procedure: ESOPHAGOGASTRODUODENOSCOPY (EGD);  Surgeon: Inda Castle, MD;  Location: [facility];  Service: Endoscopy;  Laterality: N/A;  . ESOPHAGOGASTRODUODENOSCOPY  08/07/2012   Procedure: ESOPHAGOGASTRODUODENOSCOPY (EGD);  Surgeon: Inda Castle, MD;  Location: [facility];  Service: Endoscopy;  Laterality: N/A;  . ESOPHAGOGASTRODUODENOSCOPY N/A 02/25/2014   Procedure: ESOPHAGOGASTRODUODENOSCOPY (EGD);  Surgeon: Inda Castle, MD;  Location: [facility];  Service: Endoscopy;  Laterality: N/A;  . ESOPHAGOGASTRODUODENOSCOPY N/A 03/16/2017   Procedure: ESOPHAGOGASTRODUODENOSCOPY (EGD);  Surgeon: Mauri Pole, MD;  Location: [facility] ENDOSCOPY;  Service: Endoscopy;  Laterality: N/A;  . ESOPHAGOGASTRODUODENOSCOPY N/A 08/02/2018   Procedure: ESOPHAGOGASTRODUODENOSCOPY (EGD);  Surgeon: Mauri Pole, MD;  Location: [facility];  Service: Endoscopy;  Laterality: N/A;  . ESOPHAGOGASTRODUODENOSCOPY (EGD) WITH ESOPHAGEAL DILATION  06/27/2012   Procedure: ESOPHAGOGASTRODUODENOSCOPY  (EGD) WITH ESOPHAGEAL DILATION;  Surgeon: Ladene Artist, MD,FACG;  Location: [facility];  Service: Endoscopy;  Laterality: N/A;  . ESOPHAGOGASTRODUODENOSCOPY (EGD) WITH PROPOFOL N/A 10/09/2015   Procedure: ESOPHAGOGASTRODUODENOSCOPY (EGD) WITH PROPOFOL ( WITH BOTOX);  Surgeon: Milus Banister, MD;  Location: [facility];  Service: Endoscopy;  Laterality: N/A;  . ESOPHAGOGASTRODUODENOSCOPY (EGD) WITH PROPOFOL N/A 12/03/2015   Procedure: ESOPHAGOGASTRODUODENOSCOPY (EGD) WITH PROPOFOL;  Surgeon: Mauri Pole, MD;  Location: [facility] ENDOSCOPY;  Service: Endoscopy;  Laterality: N/A;  . FOREIGN BODY REMOVAL  08/02/2018   Procedure: FOREIGN BODY REMOVAL;  Surgeon: Mauri Pole, MD;  Location: [facility];  Service: Endoscopy;;  . PACEMAKER GENERATOR CHANGE  12/13/2007   at [facility]  . TOE SURGERY  2013   left 3rd toe HAMMERTOE REPAIR  . TONSILLECTOMY  AS CHILD  . TOTAL HIP ARTHROPLASTY Right 2001  . VERICOSE VEIN LIGATION    . VIDEO ASSISTED THORACOSCOPY (VATS)/DECORTICATION  06/23/2012   Procedure: VIDEO ASSISTED THORACOSCOPY (VATS)/DECORTICATION;  Surgeon: Grace Isaac, MD;  Location: [facility];  Service: Thoracic;  Laterality: Right;  Marland Kitchen VIDEO ASSISTED THORACOSCOPY (VATS)/EMPYEMA     06/23/2012  . VIDEO BRONCHOSCOPY  06/23/2012   Procedure: VIDEO BRONCHOSCOPY;  Surgeon: Percell Miller  Maryruth Bun, MD;  Location: [facility] OR;  Service: Thoracic;  Laterality: N/A;    Allergies  Allergen Reactions  . Actonel [Risedronate Sodium] Other (See Comments)    Joint aches; rechallenged --caused joint aches  . Ivp Dye [Iodinated Diagnostic Agents] Nausea And Vomiting    Outpatient Encounter Medications as of 12/04/2019  Medication Sig  . acetaminophen (TYLENOL) 500 MG tablet Take 500 mg by mouth every 6 (six) hours as needed for mild pain or moderate pain.  . Calcium-Phosphorus-Vitamin D (CALCIUM/VITAMIN D3/ADULT GUMMY PO) Take 1 tablet by mouth See admin instructions. 500 mg calcium, 1000 iu vitamin D3,  Phosphorous 230mg . 2 daily  . cholecalciferol (VITAMIN D3) 25 MCG (1000 UNIT) tablet Take 1,000 Units by mouth daily.  . Collagen-Boron-Hyaluronic Acid ([employer] JOINT HEALTH TRIPLE ACTION PO) Take 1 tablet by mouth daily.  Derrill Memo ON 12/06/2019] denosumab (PROLIA) 60 MG/ML SOSY injection Inject 60 mg into the skin every 6 (six) months. On 1st Monday of every 6th mo.  . famotidine (PEPCID) 20 MG tablet Take 1 tablet (20 mg total) by mouth daily.  . furosemide (LASIX) 40 MG tablet Take 1 tablet (40 mg total) by mouth daily.  . Multiple Vitamins-Minerals (HM MULTIVITAMIN ADULT GUMMY PO) Take 3 tablets by mouth daily.   . Multiple Vitamins-Minerals (OCUVITE EYE + MULTI PO) Take 1 tablet by mouth daily.   . NON FORMULARY Iron plus Vitamin C tablet; 65mg /125mg ; amt: 65mg /125mg ; oral Once A Day  . NON FORMULARY Heal and Soothe capsule; 300 mg; amt: 900 mg; oral Once A Day  . potassium chloride SA (KLOR-CON) 20 MEQ tablet Take 40 mEq by mouth daily. Dilute tablets in 105ml of water to dissolve  . predniSONE (DELTASONE) 5 MG tablet Take 1 tablet (5 mg total) by mouth daily.  . [DISCONTINUED] acetaminophen (TYLENOL) 325 MG tablet Take 2 tablets (650 mg total) by mouth every 4 (four) hours as needed for headache or mild pain.  . [DISCONTINUED] nystatin (NYSTATIN) powder Apply 1 application topically 2 (two) times daily.   No facility-administered encounter medications on file as of 12/04/2019.    Review of Systems  Constitutional: Positive for appetite change and unexpected weight change.  HENT: Negative.   Respiratory: Negative.   Cardiovascular: Positive for leg swelling.  Gastrointestinal: Negative.   Genitourinary: Negative.   Musculoskeletal: Negative.   Skin: Positive for rash.  Neurological: Negative.   Psychiatric/Behavioral: Negative.     Immunization History  Administered Date(s) Administered  . Influenza Split 09/11/2012, 04/12/2013, 04/30/2015  . Influenza, High Dose Seasonal PF  04/18/2017, 04/25/2019  . Influenza,inj,Quad PF,6+ Mos 04/13/2018  . Influenza-Unspecified 09/19/2012, 05/03/2014, 04/10/2015, 04/22/2016, 04/20/2017  . Moderna SARS-COVID-2 Vaccination 07/16/2019, 08/13/2019  . Pneumococcal Conjugate-13 04/16/2015  . Pneumococcal Polysaccharide-23 07/12/2004  . Td 01/10/2012  . Tdap 11/05/2015   Pertinent  Health Maintenance Due  Topic Date Due  . INFLUENZA VACCINE  02/10/2020  . DEXA SCAN  Completed  . PNA vac Low Risk Adult  Completed   Fall Risk  10/23/2019 10/10/2019 09/12/2019 08/15/2019 07/18/2019  Falls in the past year? 0 0 1 0 0  Comment - - - - -  Number falls in past yr: 0 0 1 0 0  Comment - - - - -  Injury with Fall? - - 0 - -   Functional Status Survey:    Vitals:   12/04/19 1235  BP: 120/60  Pulse: 87  Resp: 17  Temp: 99.2 F (37.3 C)  TempSrc: Oral  SpO2: 94%  Weight: 123 lb 6.4 oz (56 kg)  Height: 5' (1.524 m)   Body mass index is 24.1 kg/m. Physical Exam Vitals reviewed.  Constitutional:      Appearance: Normal appearance.  HENT:     Head: Normocephalic.     Nose: Nose normal.     Mouth/Throat:     Mouth: Mucous membranes are moist.     Pharynx: Oropharynx is clear.  Eyes:     Pupils: Pupils are equal, round, and reactive to light.  Cardiovascular:     Rate and Rhythm: Normal rate.     Pulses: Normal pulses.  Pulmonary:     Effort: Pulmonary effort is normal. No respiratory distress.     Breath sounds: Normal breath sounds. No wheezing or rales.  Abdominal:     General: Abdomen is flat. Bowel sounds are normal.     Palpations: Abdomen is soft.  Musculoskeletal:     Cervical back: Neck supple.     Comments: Moderate edema Bilateral  Skin:    Comments: Has redness in her bottom  Neurological:     General: No focal deficit present.     Mental Status: She is alert and oriented to person, place, and time.  Psychiatric:        Mood and Affect: Mood normal.        Thought Content: Thought content normal.      Labs reviewed: Recent Labs    10/20/19 0610 10/20/19 0610 10/21/19 0126 10/21/19 0126 10/22/19 0113 11/05/19 0748 11/27/19 0000  NA 143   < > 142   < > 141 141 140  K 3.2*   < > 4.2   < > 4.0 3.4* 3.8  CL 100   < > 97*   < > 98 101 104  CO2 32   < > 35*   < > 33* 33* 31*  GLUCOSE 116*   < > 127*  --  125* 92  --   BUN 27*   < > 29*   < > 26* 30* 21  CREATININE 0.71   < > 0.69   < > 0.60 0.61 0.6  CALCIUM 9.4   < > 10.0   < > 9.7 9.6 9.6  MG 2.0  --   --   --   --   --   --    < > = values in this interval not displayed.   Recent Labs    08/10/19 0934 08/10/19 0934 10/03/19 0924 11/05/19 0748 11/27/19 0000  AST 12   < > 13 18 16   ALT 6   < > 8 12 16   ALKPHOS  --   --   --   --  59  BILITOT 0.4  --  0.4 0.6  --   PROT 6.4  --  6.2 6.3  --   ALBUMIN  --   --   --   --  3.4*   < > = values in this interval not displayed.   Recent Labs    08/10/19 0934 08/10/19 0934 10/03/19 0924 10/18/19 0047 10/20/19 0610 10/20/19 0610 10/21/19 0126 11/05/19 0748 11/27/19 0000  WBC 7.4   < > 5.0   < > 9.9   < > 10.0 5.3 5.9  NEUTROABS 4,381  --  2,785  --   --   --   --  2,533  --   HGB 9.7*   < > 11.3*   < > 12.1   < > 13.4  12.6 12.2  HCT 29.8*   < > 34.5*   < > 38.3   < > 42.6 38.2 37  MCV 96.8   < > 96.1   < > 101.6*  --  100.9* 96.7  --   PLT 183   < > 137*   < > 94*   < > 105* 109* 134*   < > = values in this interval not displayed.   Lab Results  Component Value Date   TSH 0.517 10/18/2019   Lab Results  Component Value Date   HGBA1C 5.4 02/26/2019   Lab Results  Component Value Date   CHOL 129 02/26/2019   HDL 39 (L) 02/26/2019   LDLCALC 76 02/26/2019   TRIG 65 02/26/2019   CHOLHDL 3.3 02/26/2019    Significant Diagnostic Results in last 30 days:  No results found.  Assessment/Plan  Bilateral LE edema with weight gain Discontinue Lasix Demadex 20 mg  Continue Potassium Repeat BMP Weight gain Most Likely also due to her improvement in her  appetite PMR Continue Prednisone 5 mg for now Repeat CRP in 2 weeks Stage 1 Pressure Ulcer Start on Zinc and Nystatin Stay off as much as possible Other issues Anemia, unspecified type Hgb Normal now  Hallucinations Discussed in detail with son and husband  They agreed no Work up right now Will consider if they get worse   Age-related osteoporosis without current pathological fracture T Score in 2018was -2.9 on Prolia here  Family/ staff Communication:   Labs/tests ordered:  BMP and CRP in 2 weeks

## 2019-12-05 ENCOUNTER — Encounter: Payer: Self-pay | Admitting: Internal Medicine

## 2019-12-06 NOTE — Addendum Note (Signed)
Addended by: Georgina Snell on: 12/06/2019 10:16 AM   Modules accepted: Level of Service

## 2019-12-19 LAB — BASIC METABOLIC PANEL
BUN: 24 — AB (ref 4–21)
CO2: 35 — AB (ref 13–22)
Chloride: 101 (ref 99–108)
Creatinine: 0.7 (ref 0.5–1.1)
Glucose: 87
Potassium: 3.8 (ref 3.4–5.3)
Sodium: 143 (ref 137–147)

## 2019-12-19 LAB — COMPREHENSIVE METABOLIC PANEL: Calcium: 9.7 (ref 8.7–10.7)

## 2019-12-21 ENCOUNTER — Telehealth: Payer: Self-pay

## 2019-12-21 NOTE — Telephone Encounter (Signed)
Let her know that I can see her in her room next week

## 2019-12-21 NOTE — Telephone Encounter (Signed)
Patients daughter [name] called to inform Dr. Lyndel Safe that her mother's memory has worsened and to see what she recommenced for her to do if she wants to see patient for this  Please Advise

## 2020-02-15 NOTE — Progress Notes (Signed)
Office Visit Note  Patient: [name]             Date of Birth: [birth year]           MRN: [id number]             PCP: [name], MD Referring: [name], MD Visit Date: 02/26/2020 Occupation: @GUAROCC @  Subjective:  Medication management.   History of Present Illness: OMAH DEWALT is a 86 y.o. female seen in consultation per request of Dr. Lyndel Safe.  According to the patient she was diagnosed with polymyalgia rheumatica in 2012.  She was treated at [facility] and was on prednisone until 2019.  She states she did good for a while.  In September 2020 she started experiencing increased pain and weakness.  At the time she was evaluated by Dr. Lyndel Safe and was placed on prednisone.  She was on prednisone 10 mg p.o. daily for a while and then it was tapered down to 7.5.  She has been on 5 mg of prednisone p.o. daily since March 2021.  She denies any muscle weakness or tenderness.  She states she has been tolerating prednisone well.  She is also getting Prolia injections every 6 months for osteoporosis.  Activities of Daily Living:  Patient reports morning stiffness for 20-30  minutes.   Patient Denies nocturnal pain.  Difficulty dressing/grooming: Denies Difficulty climbing stairs: Denies Difficulty getting out of chair: Denies Difficulty using hands for taps, buttons, cutlery, and/or writing: Reports  Review of Systems  Constitutional: Positive for fatigue. Negative for night sweats, weight gain and weight loss.  HENT: Positive for mouth dryness. Negative for mouth sores, trouble swallowing, trouble swallowing and nose dryness.   Eyes: Positive for dryness. Negative for pain, redness and visual disturbance.  Respiratory: Positive for shortness of breath. Negative for cough and difficulty breathing.   Cardiovascular: Negative for chest pain, palpitations, hypertension, irregular heartbeat and swelling in legs/feet.  Gastrointestinal: Negative for blood in stool, constipation  and diarrhea.  Endocrine: Negative for increased urination.  Genitourinary: Negative for difficulty urinating and vaginal dryness.  Musculoskeletal: Positive for arthralgias, joint pain, myalgias, morning stiffness and myalgias. Negative for joint swelling, muscle weakness and muscle tenderness.  Skin: Negative for color change, rash, hair loss, skin tightness, ulcers and sensitivity to sunlight.  Allergic/Immunologic: Negative for susceptible to infections.  Neurological: Positive for memory loss. Negative for dizziness, numbness, headaches, night sweats and weakness.  Hematological: Negative for bruising/bleeding tendency and swollen glands.  Psychiatric/Behavioral: Positive for confusion. Negative for depressed mood and sleep disturbance. The patient is not nervous/anxious.     PMFS History:  Patient Active Problem List   Diagnosis Date Noted  . Weight loss 10/23/2019  . CHF (congestive heart failure) ([place]) 10/18/2019  . Pressure injury of skin 10/18/2019  . Thrombocytopenia ([place]) 10/18/2019  . Hypokalemia 10/18/2019  . Age-related osteoporosis without current pathological fracture 08/31/2017  . Overweight 08/31/2017  . Callus 08/31/2017  . S/P placement of cardiac pacemaker 04/27/2017  . Hyperlipidemia 04/27/2017  . Atrophic vaginitis 04/27/2017  . Estrogen deficiency 04/27/2017  . Bezoar   . Actinic keratoses 07/06/2016  . Wound, open, finger 11/25/2015  . 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 Abnormal chest x-ray 08/18/2015  . Right inguinal hernia 04/08/2015  . Pain of right scapula 04/02/2014  . Osteopenia 11/13/2013  . Urinary incontinence 11/13/2013  . Hyperglycemia 07/17/2013  . Intrinsic sphincter deficiency 05/24/2013  . Mixed incontinence urge and stress 05/24/2013  . Reflux esophagitis 01/28/2013  .  Sinoatrial node dysfunction ([place]) 01/05/2013  . Sick sinus syndrome ([place]) 01/05/2013  . Sleep apnea 11/21/2012  . Disorder of bone and cartilage 11/21/2012  . Obesity  11/21/2012  . Edema 11/21/2012  . Personal history of other diseases of circulatory system 11/21/2012  . Achalasia of esophagus 10/10/2012  . Gastric AVM 08/07/2012  . Esophageal dysphagia 06/27/2012  . Multinodular thyroid 06/26/2012  . Megaesophagus 06/20/2012  . Polymyalgia rheumatica ([place]) 06/19/2012  . Bilateral pleural effusion 06/19/2012  . Mediastinal mass 06/19/2012  . Hammer toe 04/17/2012  . Long term current use of systemic steroids 01/27/2012  . Paroxysmal ventricular tachycardia ([place]) 01/18/2012  . Cardiac pacemaker in situ 01/18/2012  . Nonsustained ventricular tachycardia ([place]) 01/18/2012  . Sprain of wrist 05/10/2011  . Disorder of rotator cuff syndrome of shoulder and allied disorder 04/01/2011  . Bursitis of shoulder 04/01/2011    Past Medical History:  Diagnosis Date  . Achalasia   . Anemia   . Arthritis    back  . Chronic steroid use   . Complete heart block ([place])    S/P PACEMAKER 2000 W/ GENERATOR CHANGE 2009  . Empyema, right ([place]) PULMOLOGIST-- DR CLANCE   VATS 06/23/2012 cultures negative to date CXR 07/19/12 persistent airfluid levels/  CXR 11-01-2012 IMPROVE RIGHT PLEURAL EFFUSION  . GERD (gastroesophageal reflux disease)   . History of aspiration pneumonitis    DEC 2013  . History of hiatal hernia   . Hypertension   . Inguinal hernia    right  . Intrinsic urethral sphincter deficiency   . Megaesophagus   . Mixed stress and urge urinary incontinence   . Multinodular thyroid 06/26/2012   Multi nodular goiter. Large nodules in both lobes of the gland.  These nodules fit national criteria for fine needle aspiration  biopsy if not previously assessed.    . OSA on CPAP    cpap, doees not know settings  . Polymyalgia rheumatica (HCC)    on chronic Prednisone 5mg  daily  . RBBB   . S/P dilatation of esophageal stricture     Family History  Problem Relation Age of Onset  . Arthritis Father   . Heart disease Father   . Cancer Brother        ?spine   . Heart disease Brother   . Lung cancer Brother   . Heart disease Mother        Congestive heart failure  . Heart disease Brother   . Arthritis Brother   . Hypertension Brother   . Colon cancer Neg Hx    Past Surgical History:  Procedure Laterality Date  . APPENDECTOMY  1953   w/ removal benign kidney tumor   . BALLOON DILATION  07/24/2012   Procedure: BALLOON DILATION;  Surgeon: Inda Castle, MD;  Location: [facility];  Service: Endoscopy;  Laterality: N/A;  . BALLOON DILATION N/A 12/03/2015   Procedure: BALLOON DILATION;  Surgeon: Mauri Pole, MD;  Location: [facility];  Service: Endoscopy;  Laterality: N/A;  pnuematic balloon  . BALLOON DILATION N/A 03/16/2017   Procedure: BALLOON DILATION;  Surgeon: Mauri Pole, MD;  Location: [facility];  Service: Endoscopy;  Laterality: N/A;  PNUEMATIC BALLOONS  . BOTOX INJECTION  08/07/2012   Procedure: BOTOX INJECTION;  Surgeon: Inda Castle, MD;  Location: [facility];  Service: Endoscopy;  Laterality: N/A;  . BOTOX INJECTION N/A 05/24/2013   Procedure: MACROPLASTIQUE IMPLANT;  Surgeon: Irine Seal, MD;  [facility];  Service: Urology;  Laterality: N/A;  . BOTOX INJECTION  02/25/2014   Procedure: BOTOX INJECTION;  Surgeon: Inda Castle, MD;  Location: [facility];  Service: Endoscopy;;  . CARDIAC PACEMAKER PLACEMENT  06/1999  DR RUTH GREENFIELD AT [facility]  ( LAST PACER CHECK 05-09-2013) for CHB/   END-OF-LIFE GENERATOR CHANGE  2009  . CATARACT EXTRACTION W/ INTRAOCULAR LENS  IMPLANT, BILATERAL  2005  . DILATION AND CURETTAGE OF UTERUS    . ESOPHAGOGASTRODUODENOSCOPY  07/24/2012   Procedure: ESOPHAGOGASTRODUODENOSCOPY (EGD);  Surgeon: Inda Castle, MD;  Location: [facility];  Service: Endoscopy;  Laterality: N/A;  . ESOPHAGOGASTRODUODENOSCOPY  08/07/2012   Procedure: ESOPHAGOGASTRODUODENOSCOPY (EGD);  Surgeon: Inda Castle, MD;  Location: [facility];  Service: Endoscopy;   Laterality: N/A;  . ESOPHAGOGASTRODUODENOSCOPY N/A 02/25/2014   Procedure: ESOPHAGOGASTRODUODENOSCOPY (EGD);  Surgeon: Inda Castle, MD;  Location: [facility];  Service: Endoscopy;  Laterality: N/A;  . ESOPHAGOGASTRODUODENOSCOPY N/A 03/16/2017   Procedure: ESOPHAGOGASTRODUODENOSCOPY (EGD);  Surgeon: Mauri Pole, MD;  Location[facility];  Service: Endoscopy;  Laterality: N/A;  . ESOPHAGOGASTRODUODENOSCOPY N/A 08/02/2018   Procedure: ESOPHAGOGASTRODUODENOSCOPY (EGD);  Surgeon: Mauri Pole, MD;  Location: [facility];  Service: Endoscopy;  Laterality: N/A;  . ESOPHAGOGASTRODUODENOSCOPY (EGD) WITH ESOPHAGEAL DILATION  06/27/2012   Procedure: ESOPHAGOGASTRODUODENOSCOPY (EGD) WITH ESOPHAGEAL DILATION;  Surgeon: Ladene Artist, MD,FACG;  Location: [facility];  Service: Endoscopy;  Laterality: N/A;  . ESOPHAGOGASTRODUODENOSCOPY (EGD) WITH PROPOFOL N/A 10/09/2015   Procedure: ESOPHAGOGASTRODUODENOSCOPY (EGD) WITH PROPOFOL ( WITH BOTOX);  Surgeon: Milus Banister, MD;  Location: [facility];  Service: Endoscopy;  Laterality: N/A;  . ESOPHAGOGASTRODUODENOSCOPY (EGD) WITH PROPOFOL N/A 12/03/2015   Procedure: ESOPHAGOGASTRODUODENOSCOPY (EGD) WITH PROPOFOL;  Surgeon: Mauri Pole, MD;  Location: [facility];  Service: Endoscopy;  Laterality: N/A;  . FOREIGN BODY REMOVAL  08/02/2018   Procedure: FOREIGN BODY REMOVAL;  Surgeon: Mauri Pole, MD;  Location: [facility];  Service: Endoscopy;;  . PACEMAKER GENERATOR CHANGE  12/13/2007   at [facility]  . TOE SURGERY  2013   left 3rd toe HAMMERTOE REPAIR  . TONSILLECTOMY  AS CHILD  . TOTAL HIP ARTHROPLASTY Right 2001  . VERICOSE VEIN LIGATION    . VIDEO ASSISTED THORACOSCOPY (VATS)/DECORTICATION  06/23/2012   Procedure: VIDEO ASSISTED THORACOSCOPY (VATS)/DECORTICATION;  Surgeon: Grace Isaac, MD;  Location: [facility];  Service: Thoracic;  Laterality: Right;  Marland Kitchen VIDEO ASSISTED THORACOSCOPY (VATS)/EMPYEMA     06/23/2012  . VIDEO  BRONCHOSCOPY  06/23/2012   Procedure: VIDEO BRONCHOSCOPY;  Surgeon: Grace Isaac, MD;  Location: [facility];  Service: Thoracic;  Laterality: N/A;   Social History   Social History Narrative   Lives at [facility] since 05/18/2012   Married   Pacemaker   POA   Never smoked   Alcohol-wine 2-3 nights a week    Exercise - exercise classes 3 days a week    Whole Body Donation at [facility]      [facility]:   Originally from [place]. Has lived in [place], [place], [place], & moved to [place] in 1961. 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 No bird exposure.    Immunization History  Administered Date(s) Administered  . Influenza Split 09/11/2012, 04/12/2013, 04/30/2015  . Influenza, High Dose Seasonal PF 04/18/2017, 04/25/2019  . Influenza,inj,Quad PF,6+ Mos 04/13/2018  . Influenza-Unspecified 09/19/2012, 05/03/2014, 04/10/2015, 04/22/2016, 04/20/2017  . Moderna SARS-COVID-2 Vaccination 07/16/2019, 08/13/2019  . Pneumococcal Conjugate-13 04/16/2015  . Pneumococcal Polysaccharide-23  07/12/2004  . Td 01/10/2012  . Tdap 11/05/2015     Objective: Vital Signs: BP 111/66 (BP Location: Right Arm, Patient Position: Sitting, Cuff Size: Normal)   Pulse 76   Resp 14   Ht 4\' 11"  (1.499 m)   Wt 135 lb 3.2 oz (61.3 kg)   BMI 27.31 kg/m    Physical Exam Vitals and nursing note reviewed.  Constitutional:      Appearance: She is well-developed.  HENT:     Head: Normocephalic and atraumatic.  Eyes:     Conjunctiva/sclera: Conjunctivae normal.  Cardiovascular:     Rate and Rhythm: Normal rate and regular rhythm.     Heart sounds: Normal heart sounds.     Comments: Pacemaker Pulmonary:     Effort: Pulmonary effort is normal.     Breath sounds: Normal breath sounds.  Abdominal:     General: Bowel sounds are normal.     Palpations: Abdomen is soft.  Musculoskeletal:     Cervical back: Normal range of motion.  Lymphadenopathy:      Cervical: No cervical adenopathy.  Skin:    General: Skin is warm and dry.     Capillary Refill: Capillary refill takes less than 2 seconds.  Neurological:     Mental Status: She is alert and oriented to person, place, and time.  Psychiatric:        Behavior: Behavior normal.      Musculoskeletal Exam: Limited range of cervical spine was noted.  She has thoracic kyphosis.  She had some limitation range of motion of her shoulder joints.  Elbow joints and wrist joints with good range of motion.  DIP and PIP thickening was noted without any synovitis.  Hip joints and knee joints with good range of motion.  No tenderness over MTPs was noted.  She had good muscle strength in her upper and lower extremities.  She could get up easily by using armrests.  CDAI Exam: CDAI Score: -- Patient Global: --; Provider Global: -- Swollen: --; Tender: -- Joint Exam 02/26/2020   No joint exam has been documented for this visit   There is currently no information documented on the homunculus. Go to the Rheumatology activity and complete the homunculus joint exam.  Investigation: No additional findings.  Imaging: No results found.  Recent Labs: Lab Results  Component Value Date   WBC 5.9 11/27/2019   HGB 12.2 11/27/2019   PLT 134 (A) 11/27/2019   NA 140 11/27/2019   K 3.8 11/27/2019   CL 104 11/27/2019   CO2 31 (A) 11/27/2019   GLUCOSE 92 11/05/2019   BUN 21 11/27/2019   CREATININE 0.6 11/27/2019   BILITOT 0.6 11/05/2019   ALKPHOS 59 11/27/2019   AST 16 11/27/2019   ALT 16 11/27/2019   PROT 6.3 11/05/2019   ALBUMIN 3.4 (A) 11/27/2019   CALCIUM 9.6 11/27/2019   GFRAA 93 11/27/2019    Speciality Comments: No specialty comments available.  Procedures:  No procedures performed Allergies: Actonel [risedronate sodium] and Ivp dye [iodinated diagnostic agents]   Assessment / Plan:     Visit Diagnoses: Polymyalgia rheumatica ([place]) -she was diagnosed with polymyalgia rheumatica in 2012 at  [facility].  She states she was on prednisone until 2019.  She developed recurrence of symptoms last year.  She was placed on prednisone 10 mg p.o. daily by Dr. Lyndel Safe and gradually tapered to 5 mg.  The usual taper of her prednisone is 1 mg/month.  She is clinically doing well.  Her  most recent sed rate in April was within normal limits.  I would recommend taper of prednisone if her sed rate is normal by 1 mg every 2 months.  She can stay on minimal dose of prednisone which controls her symptoms.  It will be difficult sometimes to taper them completely of the prednisone.  Many patients do well on only 2 mg p.o. daily.  I will obtain some labs today.  Patient states that she would prefer to get treatment by Dr. Kermit Balo as it is inconvenient for her to travel to our office.  In agreement with her.  I will leave my recommendations.  Have will also contact after the lab results are available.  Plan: Sedimentation rate  Long term current use of systemic steroids-she is currently on 5 mg p.o. twice daily.  Myalgia - Plan: CK  Pain in both hands -I believe some of the stiffness in her hands is coming from osteoarthritis.  I do not see any synovitis.  Plan: Rheumatoid factor, Cyclic citrul peptide antibody, IgG  Age-related osteoporosis without current pathological fracture - T Score in 2018 was -2.9.  She is on Prolia by Dr. Lyndel Safe.  Other medical problems are listed as follows:  Complete heart block (HCC)  Sinoatrial node dysfunction (HCC)  Sick sinus syndrome (HCC)  Nonsustained ventricular tachycardia (HCC)  Cardiac pacemaker in situ  Acute diastolic congestive heart failure (HCC)  Gastric AVM  Esophageal dysphagia  Achalasia of esophagus  History of gastroesophageal reflux (GERD)  Multinodular thyroid  Orders: Orders Placed This Encounter  Procedures  . CK  . Sedimentation rate  . Rheumatoid factor  . Cyclic citrul peptide antibody, IgG   No orders of the defined types were placed in  this encounter.    Follow-Up Instructions: Return if symptoms worsen or fail to improve, for Polymyalgia rheumatica, Osteoarthritis, Osteoporosis.   Bo Merino, MD  Note - This record has been created using Editor, commissioning.  Chart creation errors have been sought, but may not always  have been located. Such creation errors do not reflect on  the standard of medical care.

## 2020-02-26 ENCOUNTER — Other Ambulatory Visit: Payer: Self-pay

## 2020-02-26 ENCOUNTER — Ambulatory Visit (INDEPENDENT_AMBULATORY_CARE_PROVIDER_SITE_OTHER): Payer: Medicare Other | Admitting: Rheumatology

## 2020-02-26 ENCOUNTER — Encounter: Payer: Self-pay | Admitting: Rheumatology

## 2020-02-26 VITALS — BP 111/66 | HR 76 | Resp 14 | Ht 59.0 in | Wt 135.2 lb

## 2020-02-26 DIAGNOSIS — Z950 Presence of cardiac pacemaker: Secondary | ICD-10-CM

## 2020-02-26 DIAGNOSIS — M791 Myalgia, unspecified site: Secondary | ICD-10-CM | POA: Diagnosis not present

## 2020-02-26 DIAGNOSIS — M353 Polymyalgia rheumatica: Secondary | ICD-10-CM | POA: Diagnosis not present

## 2020-02-26 DIAGNOSIS — E042 Nontoxic multinodular goiter: Secondary | ICD-10-CM

## 2020-02-26 DIAGNOSIS — K31819 Angiodysplasia of stomach and duodenum without bleeding: Secondary | ICD-10-CM

## 2020-02-26 DIAGNOSIS — R1319 Other dysphagia: Secondary | ICD-10-CM

## 2020-02-26 DIAGNOSIS — R1310 Dysphagia, unspecified: Secondary | ICD-10-CM

## 2020-02-26 DIAGNOSIS — M79642 Pain in left hand: Secondary | ICD-10-CM

## 2020-02-26 DIAGNOSIS — Z7952 Long term (current) use of systemic steroids: Secondary | ICD-10-CM

## 2020-02-26 DIAGNOSIS — K220 Achalasia of cardia: Secondary | ICD-10-CM

## 2020-02-26 DIAGNOSIS — I472 Ventricular tachycardia: Secondary | ICD-10-CM

## 2020-02-26 DIAGNOSIS — Z8719 Personal history of other diseases of the digestive system: Secondary | ICD-10-CM

## 2020-02-26 DIAGNOSIS — I4729 Other ventricular tachycardia: Secondary | ICD-10-CM

## 2020-02-26 DIAGNOSIS — M79641 Pain in right hand: Secondary | ICD-10-CM

## 2020-02-26 DIAGNOSIS — I495 Sick sinus syndrome: Secondary | ICD-10-CM

## 2020-02-26 DIAGNOSIS — I5031 Acute diastolic (congestive) heart failure: Secondary | ICD-10-CM

## 2020-02-26 DIAGNOSIS — I442 Atrioventricular block, complete: Secondary | ICD-10-CM

## 2020-02-26 DIAGNOSIS — M810 Age-related osteoporosis without current pathological fracture: Secondary | ICD-10-CM

## 2020-02-27 LAB — CYCLIC CITRUL PEPTIDE ANTIBODY, IGG: Cyclic Citrullin Peptide Ab: <16 UNITS

## 2020-02-27 LAB — RHEUMATOID FACTOR: Rheumatoid fact SerPl-aCnc: <14 IU/mL (ref <14)

## 2020-02-27 LAB — CK: Total CK: 22 U/L — ABNORMAL LOW (ref 29–143)

## 2020-02-27 LAB — SEDIMENTATION RATE: Sed Rate: 22 mm/h (ref 0–30)

## 2020-03-10 ENCOUNTER — Encounter: Payer: Self-pay | Admitting: Nurse Practitioner

## 2020-03-10 ENCOUNTER — Non-Acute Institutional Stay: Payer: Medicare Other | Admitting: Nurse Practitioner

## 2020-03-10 DIAGNOSIS — E876 Hypokalemia: Secondary | ICD-10-CM

## 2020-03-10 DIAGNOSIS — K2100 Gastro-esophageal reflux disease with esophagitis, without bleeding: Secondary | ICD-10-CM | POA: Diagnosis not present

## 2020-03-10 DIAGNOSIS — G4733 Obstructive sleep apnea (adult) (pediatric): Secondary | ICD-10-CM

## 2020-03-10 DIAGNOSIS — I5031 Acute diastolic (congestive) heart failure: Secondary | ICD-10-CM | POA: Diagnosis not present

## 2020-03-10 DIAGNOSIS — M353 Polymyalgia rheumatica: Secondary | ICD-10-CM

## 2020-03-10 DIAGNOSIS — D696 Thrombocytopenia, unspecified: Secondary | ICD-10-CM

## 2020-03-10 LAB — C-REACTIVE PROTEIN: CRP: 3.1

## 2020-03-10 NOTE — Assessment & Plan Note (Signed)
CHF, last hospitalization 10/18/19-10/22/19 edema BLE, BNP 122,  echocradiogram  EF 04-47%, grade I diastolic dysfunction.  CXR bilateral effusions, on Torsemide 20mg  qd.

## 2020-03-10 NOTE — Assessment & Plan Note (Signed)
CPAP/O2 at night 

## 2020-03-10 NOTE — Assessment & Plan Note (Signed)
Replete, continue Kcl

## 2020-03-10 NOTE — Progress Notes (Signed)
Location:   [facility] Room Number: [place] of Service:  ALF [phone]) Provider:  Jiovanni Heeter, Lennie Odor NP  [name], MD  Patient Care Team: [name], MD as PCP - General (Internal Medicine) Melina Modena, [facility] [name], MD as Consulting Physician (Cardiology) Irine Seal, MD as Attending Physician (Urology) Clance, Armando Reichert, MD as Consulting Physician (Pulmonary Disease) Grace Isaac, MD as Consulting Physician (Cardiothoracic Surgery) Mauri Pole, MD as Consulting Physician (Gastroenterology)  Extended Emergency Contact Information Primary Emergency Contact: Conklin,Francis Address: [address]          [place], [place] [address] [place] Phone: [phone] Relation: Spouse Secondary Emergency Contact: Mcmillen,Dorothy Address: [address]          [place], [place] [address] [place] Phone: [phone] Mobile Phone: [phone] Relation: Daughter  Code Status:  DNR Goals of care: Advanced Directive information Advanced Directives 03/10/2020  Does Patient Have a Medical Advance Directive? Yes  Type of Paramedic of [place];Out of facility DNR (pink MOST or yellow form)  Does patient want to make changes to medical advance directive? No - Patient declined  Copy of [place] in Chart? Yes - validated most recent copy scanned in chart (See row information)  Would patient like information on creating a medical advance directive? -  Pre-existing out of facility DNR order (yellow form or pink MOST form) Yellow form placed in chart (order not valid for inpatient use);Pink MOST form placed in chart (order not valid for inpatient use)     Chief Complaint  Patient presents with   Medical Management of Chronic Issues    HPI:  Pt is a 86 y.o. female seen today for medical management of chronic diseases.     CHF, last hospitalization 10/18/19-10/22/19  edema BLE, BNP 122,  echocradiogram  EF 17-79%, grade I diastolic dysfunction.  CXR bilateral effusions, on Torsemide 20mg  qd.   Hx of PMR on Prednisone, saw rheumatology 02/26/20    Dysphagia diet/achalasia of esophagus, s/p dilation, on Pepcid  Sleep apnea, CPAP/O2 at night  Thrombocytopenia, plt 100s,    Hypokalemia replete, on Kcl 18meq qd    Past Medical History:  Diagnosis Date   Achalasia    Anemia    Arthritis    back   Chronic steroid use    Complete heart block ([place])    S/P PACEMAKER 2000 W/ GENERATOR CHANGE 2009   Empyema, right ([place]) PULMOLOGIST-- DR CLANCE   VATS 06/23/2012 cultures negative to date CXR 07/19/12 persistent airfluid levels/  CXR 11-01-2012 IMPROVE RIGHT PLEURAL EFFUSION   GERD (gastroesophageal reflux disease)    History of aspiration pneumonitis    DEC 2013   History of hiatal hernia    Hypertension    Inguinal hernia    right   Intrinsic urethral sphincter deficiency    Megaesophagus    Mixed stress and urge urinary incontinence    Multinodular thyroid 06/26/2012   Multi nodular goiter. Large nodules in both lobes of the gland.  These nodules fit national criteria for fine needle aspiration  biopsy if not previously assessed.     OSA on CPAP    cpap, doees not know settings   Polymyalgia rheumatica (HCC)    on chronic Prednisone 5mg  daily   RBBB    S/P dilatation of esophageal stricture    Past Surgical History:  Procedure Laterality Date   APPENDECTOMY  0630   w/ removal benign kidney tumor    BALLOON DILATION  07/24/2012   Procedure: BALLOON DILATION;  Surgeon: Inda Castle, MD;  Location: [facility];  Service: Endoscopy;  Laterality: N/A;   BALLOON DILATION N/A 12/03/2015   Procedure: BALLOON DILATION;  Surgeon: Mauri Pole, MD;  Location: [facility];  Service: Endoscopy;  Laterality: N/A;  pnuematic balloon   BALLOON DILATION N/A 03/16/2017   Procedure: BALLOON DILATION;  Surgeon: Mauri Pole, MD;   Location: [facility];  Service: Endoscopy;  Laterality: N/A;  PNUEMATIC BALLOONS   BOTOX INJECTION  08/07/2012   Procedure: BOTOX INJECTION;  Surgeon: Inda Castle, MD;  Location: [facility];  Service: Endoscopy;  Laterality: N/A;   BOTOX INJECTION N/A 05/24/2013   Procedure: MACROPLASTIQUE IMPLANT;  Surgeon: Irine Seal, MD;  [facility];  Service: Urology;  Laterality: N/A;   BOTOX INJECTION  02/25/2014   Procedure: BOTOX INJECTION;  Surgeon: Inda Castle, MD;  Location: [facility];  Service: Endoscopy;;   CARDIAC PACEMAKER PLACEMENT  06/1999  DR RUTH GREENFIELD AT [facility]  ( LAST PACER CHECK 05-09-2013) for CHB/   END-OF-LIFE GENERATOR CHANGE  2009   CATARACT EXTRACTION W/ INTRAOCULAR LENS  IMPLANT, BILATERAL  2005   DILATION AND CURETTAGE OF UTERUS     ESOPHAGOGASTRODUODENOSCOPY  07/24/2012   Procedure: ESOPHAGOGASTRODUODENOSCOPY (EGD);  Surgeon: Inda Castle, MD;  Location: [facility];  Service: Endoscopy;  Laterality: N/A;   ESOPHAGOGASTRODUODENOSCOPY  08/07/2012   Procedure: ESOPHAGOGASTRODUODENOSCOPY (EGD);  Surgeon: Inda Castle, MD;  Location: [facility];  Service: Endoscopy;  Laterality: N/A;   ESOPHAGOGASTRODUODENOSCOPY N/A 02/25/2014   Procedure: ESOPHAGOGASTRODUODENOSCOPY (EGD);  Surgeon: Inda Castle, MD;  Location: [facility];  Service: Endoscopy;  Laterality: N/A;   ESOPHAGOGASTRODUODENOSCOPY N/A 03/16/2017   Procedure: ESOPHAGOGASTRODUODENOSCOPY (EGD);  Surgeon: Mauri Pole, MD;  Location: [facility];  Service: Endoscopy;  Laterality: N/A;   ESOPHAGOGASTRODUODENOSCOPY N/A 08/02/2018   Procedure: ESOPHAGOGASTRODUODENOSCOPY (EGD);  Surgeon: Mauri Pole, MD;  Location: [facility];  Service: Endoscopy;  Laterality: N/A;   ESOPHAGOGASTRODUODENOSCOPY (EGD) WITH ESOPHAGEAL DILATION  06/27/2012   Procedure: ESOPHAGOGASTRODUODENOSCOPY (EGD) WITH ESOPHAGEAL DILATION;  Surgeon: Ladene Artist, MD,FACG;   Location: [facility];  Service: Endoscopy;  Laterality: N/A;   ESOPHAGOGASTRODUODENOSCOPY (EGD) WITH PROPOFOL N/A 10/09/2015   Procedure: ESOPHAGOGASTRODUODENOSCOPY (EGD) WITH PROPOFOL ( WITH BOTOX);  Surgeon: Milus Banister, MD;  Location: [facility];  Service: Endoscopy;  Laterality: N/A;   ESOPHAGOGASTRODUODENOSCOPY (EGD) WITH PROPOFOL N/A 12/03/2015   Procedure: ESOPHAGOGASTRODUODENOSCOPY (EGD) WITH PROPOFOL;  Surgeon: Mauri Pole, MD;  Location: [facility];  Service: Endoscopy;  Laterality: N/A;   FOREIGN BODY REMOVAL  08/02/2018   Procedure: FOREIGN BODY REMOVAL;  Surgeon: Mauri Pole, MD;  Location: [facility];  Service: Endoscopy;;   PACEMAKER GENERATOR CHANGE  12/13/2007   at Between  2013   left 3rd toe HAMMERTOE REPAIR   TONSILLECTOMY  AS CHILD   TOTAL HIP ARTHROPLASTY Right 2001   VERICOSE VEIN LIGATION     VIDEO ASSISTED THORACOSCOPY (VATS)/DECORTICATION  06/23/2012   Procedure: VIDEO ASSISTED THORACOSCOPY (VATS)/DECORTICATION;  Surgeon: Grace Isaac, MD;  Location: [facility];  Service: Thoracic;  Laterality: Right;   [place] (VATS)/EMPYEMA     06/23/2012   VIDEO BRONCHOSCOPY  06/23/2012   Procedure: VIDEO BRONCHOSCOPY;  Surgeon: Grace Isaac, MD;  Location: [facility];  Service: Thoracic;  Laterality: N/A;  Allergies  Allergen Reactions   Actonel [Risedronate Sodium] Other (See Comments)    Joint aches; rechallenged --caused joint aches   Ivp Dye [Iodinated Diagnostic Agents] Nausea And Vomiting    Allergies as of 03/10/2020      Reactions   Actonel [risedronate Sodium] Other (See Comments)   Joint aches; rechallenged --caused joint aches   Ivp Dye [iodinated Diagnostic Agents] Nausea And Vomiting      Medication List       Accurate as of March 10, 2020 11:59 PM. If you have any questions, ask your nurse or doctor.        STOP taking these medications   furosemide 40 MG tablet Commonly known  as: LASIX Stopped by: Lolamae Voisin X Jeananne Bedwell, NP     TAKE these medications   acetaminophen 500 MG tablet Commonly known as: TYLENOL Take 500 mg by mouth every 6 (six) hours as needed for mild pain or moderate pain.   CALCIUM/VITAMIN D3/ADULT GUMMY PO Take 1 tablet by mouth See admin instructions. 500 mg calcium, 1000 iu vitamin D3, Phosphorous 230mg . 2 daily   cholecalciferol 25 MCG (1000 UNIT) tablet Commonly known as: VITAMIN D3 Take 1,000 Units by mouth daily.   [employer] JOINT HEALTH TRIPLE ACTION PO Take 1 tablet by mouth daily.   denosumab 60 MG/ML Sosy injection Commonly known as: PROLIA Inject 60 mg into the skin every 6 (six) months. On 1st Monday of every 6th mo.   famotidine 20 MG tablet Commonly known as: PEPCID Take 1 tablet (20 mg total) by mouth daily.   HM MULTIVITAMIN ADULT GUMMY PO Take 3 tablets by mouth daily.   OCUVITE EYE + MULTI PO Take 1 tablet by mouth daily.   NON FORMULARY Iron plus Vitamin C tablet; 65mg /125mg ; amt: 65mg /125mg ; oral Once A Day   NON FORMULARY Heal and Soothe capsule; 300 mg; amt: 900 mg; oral Once A Day   potassium chloride SA 20 MEQ tablet Commonly known as: KLOR-CON Take 40 mEq by mouth daily. Dilute tablets in 80ml of water to dissolve   predniSONE 5 MG tablet Commonly known as: DELTASONE Take 1 tablet (5 mg total) by mouth daily.   torsemide 20 MG tablet Commonly known as: DEMADEX Take 20 mg by mouth daily.   zinc oxide 20 % ointment Apply 1 application topically 2 (two) times daily as needed for irritation.       Review of Systems  Constitutional: Negative for fatigue, fever and unexpected weight change.       Weight loss  HENT: Positive for hearing loss and trouble swallowing. Negative for congestion and voice change.   Eyes: Negative for visual disturbance.  Respiratory: Negative for cough, chest tightness, shortness of breath and wheezing.        DOE  Cardiovascular: Positive for leg swelling. Negative for chest pain  and palpitations.  Gastrointestinal: Negative for abdominal pain, constipation and diarrhea.  Genitourinary: Positive for frequency. Negative for dysuria.       Urination 3x/night.   Musculoskeletal: Positive for arthralgias, gait problem and myalgias.  Skin: Negative for color change.  Neurological: Negative for speech difficulty, weakness and light-headedness.  Psychiatric/Behavioral: Positive for sleep disturbance. Negative for behavioral problems. The patient is not nervous/anxious.        Thoughts interfere her night sleep.     Immunization History  Administered Date(s) Administered   Influenza Split 09/11/2012, 04/12/2013, 04/30/2015   Influenza, High Dose Seasonal PF 04/18/2017, 04/25/2019   Influenza,inj,Quad PF,6+ Mos 04/13/2018  Influenza-Unspecified 09/19/2012, 05/03/2014, 04/10/2015, 04/22/2016, 04/20/2017   Moderna SARS-COVID-2 Vaccination 07/16/2019, 08/13/2019   Pneumococcal Conjugate-13 04/16/2015   Pneumococcal Polysaccharide-23 07/12/2004   Td 01/10/2012   Tdap 11/05/2015   Pertinent  Health Maintenance Due  Topic Date Due   INFLUENZA VACCINE  02/10/2020   DEXA SCAN  Completed   PNA vac Low Risk Adult  Completed   Fall Risk  10/23/2019 10/10/2019 09/12/2019 08/15/2019 07/18/2019  Falls in the past year? 0 0 1 0 0  Comment - - - - -  Number falls in past yr: 0 0 1 0 0  Comment - - - - -  Injury with Fall? - - 0 - -   Functional Status Survey:    Vitals:   03/10/20 0958  BP: 118/70  Pulse: 66  Resp: 18  Temp: 98.7 F (37.1 C)  SpO2: 96%  Weight: 129 lb (58.5 kg)  Height: 5' (1.524 m)   Body mass index is 25.19 kg/m. Physical Exam Vitals and nursing note reviewed.  Constitutional:      Appearance: Normal appearance. She is normal weight.  HENT:     Head: Normocephalic and atraumatic.     Mouth/Throat:     Mouth: Mucous membranes are moist.  Eyes:     Extraocular Movements: Extraocular movements intact.     Conjunctiva/sclera:  Conjunctivae normal.     Pupils: Pupils are equal, round, and reactive to light.  Cardiovascular:     Rate and Rhythm: Normal rate and regular rhythm.     Heart sounds: No murmur heard.      Comments: Pace maker Pulmonary:     Breath sounds: No wheezing, rhonchi or rales.  Abdominal:     General: Bowel sounds are normal.     Palpations: Abdomen is soft.     Tenderness: There is no abdominal tenderness.     Hernia: A hernia is present.     Comments: Right inguinal hernia.   Musculoskeletal:     Cervical back: Normal range of motion and neck supple.     Right lower leg: Edema present.     Left lower leg: Edema present.     Comments: Trace to 1+ edema BLE  Skin:    General: Skin is warm and dry.  Neurological:     General: No focal deficit present.     Mental Status: She is alert and oriented to person, place, and time. Mental status is at baseline.     Motor: No weakness.     Coordination: Coordination normal.     Gait: Gait abnormal.  Psychiatric:        Mood and Affect: Mood normal.        Behavior: Behavior normal.        Thought Content: Thought content normal.        Judgment: Judgment normal.     Labs reviewed: Recent Labs    10/03/19 0924 10/20/19 0610 10/20/19 0610 10/21/19 0126 10/21/19 0126 10/22/19 0113 11/05/19 0748 11/27/19 0000 12/19/19 0000  NA   < > 143   < > 142   < > 141 141 140 143  K   < > 3.2*   < > 4.2   < > 4.0 3.4* 3.8 3.8  CL   < > 100   < > 97*   < > 98 101 104 101  CO2   < > 32   < > 35*   < > 33* 33* 31* 35*  GLUCOSE  --  116*   < > 127*  --  125* 92  --   --   BUN   < > 27*   < > 29*   < > 26* 30* 21 24*  CREATININE   < > 0.71   < > 0.69   < > 0.60 0.61 0.6 0.7  CALCIUM   < > 9.4   < > 10.0   < > 9.7 9.6 9.6 9.7  MG  --  2.0  --   --   --   --   --   --   --    < > = values in this interval not displayed.   Recent Labs    08/10/19 0934 08/10/19 0934 10/03/19 0924 11/05/19 0748 11/27/19 0000  AST 12   < > 13 18 16   ALT 6   <  > 8 12 16   ALKPHOS  --   --   --   --  59  BILITOT 0.4  --  0.4 0.6  --   PROT 6.4  --  6.2 6.3  --   ALBUMIN  --   --   --   --  3.4*   < > = values in this interval not displayed.   Recent Labs    08/10/19 0934 08/10/19 0934 10/03/19 0924 10/18/19 0047 10/20/19 0610 10/20/19 0610 10/21/19 0126 11/05/19 0748 11/27/19 0000  WBC 7.4   < > 5.0   < > 9.9   < > 10.0 5.3 5.9  NEUTROABS 4,381  --  2,785  --   --   --   --  2,533  --   HGB 9.7*   < > 11.3*   < > 12.1   < > 13.4 12.6 12.2  HCT 29.8*   < > 34.5*   < > 38.3   < > 42.6 38.2 37  MCV 96.8   < > 96.1   < > 101.6*  --  100.9* 96.7  --   PLT 183   < > 137*   < > 94*   < > 105* 109* 134*   < > = values in this interval not displayed.   Lab Results  Component Value Date   TSH 0.517 10/18/2019   Lab Results  Component Value Date   HGBA1C 5.4 02/26/2019   Lab Results  Component Value Date   CHOL 129 02/26/2019   HDL 39 (L) 02/26/2019   LDLCALC 76 02/26/2019   TRIG 65 02/26/2019   CHOLHDL 3.3 02/26/2019    Significant Diagnostic Results in last 30 days:  No results found.  Assessment/Plan CHF (congestive heart failure) (HCC) CHF, last hospitalization 10/18/19-10/22/19 edema BLE, BNP 122,  echocradiogram  EF 45-80%, grade I diastolic dysfunction.  CXR bilateral effusions, on Torsemide 20mg  qd.    Sleep apnea CPAP/O2 at night  Reflux esophagitis Dysphagia diet/achalasia of esophagus, s/p dilation, on Pepcid   Hypokalemia Replete, continue Kcl   Polymyalgia rheumatica ([place]) Hx of PMR on Prednisone, saw rheumatology 02/26/20     Thrombocytopenia (HCC) Thrombocytopenia, plt 100s,        Family/ staff Communication: plan of care reviewed with the patient and charge nurse.   Labs/tests ordered:  none  Time spend 40 minutes.

## 2020-03-10 NOTE — Assessment & Plan Note (Signed)
Hx of PMR on Prednisone, saw rheumatology 02/26/20

## 2020-03-10 NOTE — Assessment & Plan Note (Signed)
Dysphagia diet/achalasia of esophagus, s/p dilation, on Pepcid

## 2020-03-10 NOTE — Assessment & Plan Note (Signed)
Thrombocytopenia, plt 100s,

## 2020-03-11 ENCOUNTER — Encounter: Payer: Self-pay | Admitting: Nurse Practitioner

## 2020-03-26 ENCOUNTER — Ambulatory Visit: Payer: Medicare Other | Admitting: Rheumatology

## 2020-04-22 ENCOUNTER — Non-Acute Institutional Stay: Payer: Medicare Other | Admitting: Internal Medicine

## 2020-04-22 ENCOUNTER — Encounter: Payer: Self-pay | Admitting: Internal Medicine

## 2020-04-22 DIAGNOSIS — M353 Polymyalgia rheumatica: Secondary | ICD-10-CM

## 2020-04-22 DIAGNOSIS — K220 Achalasia of cardia: Secondary | ICD-10-CM | POA: Diagnosis not present

## 2020-04-22 DIAGNOSIS — R443 Hallucinations, unspecified: Secondary | ICD-10-CM

## 2020-04-22 DIAGNOSIS — G4733 Obstructive sleep apnea (adult) (pediatric): Secondary | ICD-10-CM | POA: Diagnosis not present

## 2020-04-22 DIAGNOSIS — M810 Age-related osteoporosis without current pathological fracture: Secondary | ICD-10-CM | POA: Diagnosis not present

## 2020-04-22 DIAGNOSIS — R600 Localized edema: Secondary | ICD-10-CM

## 2020-04-22 NOTE — Progress Notes (Signed)
Location:   [place] Room Number: [place] of Service:  [id number]) Provider:  Veleta Miners MD  [name], MD  Patient Care Team: [name], MD as PCP - General (Internal Medicine) Melina Modena, [facility] [name], MD as Consulting Physician (Cardiology) Irine Seal, MD as Attending Physician (Urology) Clance, Armando Reichert, MD as Consulting Physician (Pulmonary Disease) Grace Isaac, MD as Consulting Physician (Cardiothoracic Surgery) Mauri Pole, MD as Consulting Physician (Gastroenterology)  Extended Emergency Contact Information Primary Emergency Contact: Paskett,Francis Address: [address]          [place], [place] [address] [place] Phone: [phone] Relation: Spouse Secondary Emergency Contact: Stockert,Dorothy Address: [address]          [place], [place] [address] [place] of [place] Phone: [phone] Mobile Phone: [phone] Relation: Daughter  Code Status:  DNR Goals of care: Advanced Directive information Advanced Directives 03/10/2020  Does Patient Have a Medical Advance Directive? Yes  Type of Paramedic of [place];Out of facility DNR (pink MOST or yellow form)  Does patient want to make changes to medical advance directive? No - Patient declined  Copy of [place] in Chart? Yes - validated most recent copy scanned in chart (See row information)  Would patient like information on creating a medical advance directive? -  Pre-existing out of facility DNR order (yellow form or pink MOST form) Yellow form placed in chart (order not valid for inpatient use);Pink MOST form placed in chart (order not valid for inpatient use)     Chief Complaint  Patient presents with  . Acute Visit    Follow up of her PMR    HPI:  Pt is a 86 y.o. female seen today for an acute visit for her PMR with possible Tapering of her Prednisone. Also Follow up  of her other issues  Patient has a history of PMR, esophageal dysphagia, diastolic CHF, s/p pacemaker, anemia, hallucinations visual, osteoporosis, compression fracture, OSA  Doing well in AL. Weight is stable Walks with her walker Continues to have some stiffness and weakness in her Proximal Muscles But Able to stay independent in her ADLS No Pian. LE edema Better. No SOB Tolerating Puree diet Wants to know if it can be changed  Past Medical History:  Diagnosis Date  . Achalasia   . Anemia   . Arthritis    back  . Chronic steroid use   . Complete heart block ([place])    S/P PACEMAKER 2000 W/ GENERATOR CHANGE 2009  . Empyema, right ([place]) PULMOLOGIST-- DR CLANCE   VATS 06/23/2012 cultures negative to date CXR 07/19/12 persistent airfluid levels/  CXR 11-01-2012 IMPROVE RIGHT PLEURAL EFFUSION  . GERD (gastroesophageal reflux disease)   . History of aspiration pneumonitis    DEC 2013  . History of hiatal hernia   . Hypertension   . Inguinal hernia    right  . Intrinsic urethral sphincter deficiency   . Megaesophagus   . Mixed stress and urge urinary incontinence   . Multinodular thyroid 06/26/2012   Multi nodular goiter. Large nodules in both lobes of the gland.  These nodules fit national criteria for fine needle aspiration  biopsy if not previously assessed.    . OSA on CPAP    cpap, doees not know settings  . Polymyalgia rheumatica (HCC)    on chronic Prednisone 15m daily  . RBBB   . S/P  dilatation of esophageal stricture    Past Surgical History:  Procedure Laterality Date  . APPENDECTOMY  1953   w/ removal benign kidney tumor   . BALLOON DILATION  07/24/2012   Procedure: BALLOON DILATION;  Surgeon: Inda Castle, MD;  Location: [facility];  Service: Endoscopy;  Laterality: N/A;  . BALLOON DILATION N/A 12/03/2015   Procedure: BALLOON DILATION;  Surgeon: Mauri Pole, MD;  Location: [facility];  Service: Endoscopy;  Laterality: N/A;  pnuematic balloon  . BALLOON  DILATION N/A 03/16/2017   Procedure: BALLOON DILATION;  Surgeon: Mauri Pole, MD;  Location: [facility];  Service: Endoscopy;  Laterality: N/A;  PNUEMATIC BALLOONS  . BOTOX INJECTION  08/07/2012   Procedure: BOTOX INJECTION;  Surgeon: Inda Castle, MD;  Location: [facility];  Service: Endoscopy;  Laterality: N/A;  . BOTOX INJECTION N/A 05/24/2013   Procedure: MACROPLASTIQUE IMPLANT;  Surgeon: Irine Seal, MD;  Location: [facility];  Service: Urology;  Laterality: N/A;  . BOTOX INJECTION  02/25/2014   Procedure: BOTOX INJECTION;  Surgeon: Inda Castle, MD;  Location: [facility];  Service: Endoscopy;;  . CARDIAC PACEMAKER PLACEMENT  06/1999  DR RUTH GREENFIELD AT [facility]  ( LAST PACER CHECK 05-09-2013) for CHB/   END-OF-LIFE GENERATOR CHANGE  2009  . CATARACT EXTRACTION W/ INTRAOCULAR LENS  IMPLANT, BILATERAL  2005  . DILATION AND CURETTAGE OF UTERUS    . ESOPHAGOGASTRODUODENOSCOPY  07/24/2012   Procedure: ESOPHAGOGASTRODUODENOSCOPY (EGD);  Surgeon: Inda Castle, MD;  Location: [facility];  Service: Endoscopy;  Laterality: N/A;  . ESOPHAGOGASTRODUODENOSCOPY  08/07/2012   Procedure: ESOPHAGOGASTRODUODENOSCOPY (EGD);  Surgeon: Inda Castle, MD;  Location: [facility];  Service: Endoscopy;  Laterality: N/A;  . ESOPHAGOGASTRODUODENOSCOPY N/A 02/25/2014   Procedure: ESOPHAGOGASTRODUODENOSCOPY (EGD);  Surgeon: Inda Castle, MD;  Location: [facility];  Service: Endoscopy;  Laterality: N/A;  . ESOPHAGOGASTRODUODENOSCOPY N/A 03/16/2017   Procedure: ESOPHAGOGASTRODUODENOSCOPY (EGD);  Surgeon: Mauri Pole, MD;  Location: [facility] ENDOSCOPY;  Service: Endoscopy;  Laterality: N/A;  . ESOPHAGOGASTRODUODENOSCOPY N/A 08/02/2018   Procedure: ESOPHAGOGASTRODUODENOSCOPY (EGD);  Surgeon: Mauri Pole, MD;  Location: [facility];  Service: Endoscopy;  Laterality: N/A;  . ESOPHAGOGASTRODUODENOSCOPY (EGD) WITH ESOPHAGEAL DILATION  06/27/2012   Procedure:  ESOPHAGOGASTRODUODENOSCOPY (EGD) WITH ESOPHAGEAL DILATION;  Surgeon: Ladene Artist, MD,FACG;  Location: [facility];  Service: Endoscopy;  Laterality: N/A;  . ESOPHAGOGASTRODUODENOSCOPY (EGD) WITH PROPOFOL N/A 10/09/2015   Procedure: ESOPHAGOGASTRODUODENOSCOPY (EGD) WITH PROPOFOL ( WITH BOTOX);  Surgeon: Milus Banister, MD;  Location: [facility];  Service: Endoscopy;  Laterality: N/A;  . ESOPHAGOGASTRODUODENOSCOPY (EGD) WITH PROPOFOL N/A 12/03/2015   Procedure: ESOPHAGOGASTRODUODENOSCOPY (EGD) WITH PROPOFOL;  Surgeon: Mauri Pole, MD;  Location: [facility];  Service: Endoscopy;  Laterality: N/A;  . FOREIGN BODY REMOVAL  08/02/2018   Procedure: FOREIGN BODY REMOVAL;  Surgeon: Mauri Pole, MD;  Location: [facility];  Service: Endoscopy;;  . PACEMAKER GENERATOR CHANGE  12/13/2007   at [facility]  . TOE SURGERY  2013   left 3rd toe HAMMERTOE REPAIR  . TONSILLECTOMY  AS CHILD  . TOTAL HIP ARTHROPLASTY Right 2001  . VERICOSE VEIN LIGATION    . VIDEO ASSISTED THORACOSCOPY (VATS)/DECORTICATION  06/23/2012   Procedure: VIDEO ASSISTED THORACOSCOPY (VATS)/DECORTICATION;  Surgeon: Grace Isaac, MD;  Location: [facility];  Service: Thoracic;  Laterality: Right;  Marland Kitchen VIDEO ASSISTED THORACOSCOPY (VATS)/EMPYEMA     06/23/2012  . VIDEO BRONCHOSCOPY  06/23/2012   Procedure: VIDEO BRONCHOSCOPY;  Surgeon: Grace Isaac, MD;  Location: [facility] OR;  Service: Thoracic;  Laterality: N/A;    Allergies  Allergen Reactions  . Actonel [Risedronate Sodium] Other (See Comments)    Joint aches; rechallenged --caused joint aches  . Ivp Dye [Iodinated Diagnostic Agents] Nausea And Vomiting    Allergies as of 04/22/2020      Reactions   Actonel [risedronate Sodium] Other (See Comments)   Joint aches; rechallenged --caused joint aches   Ivp Dye [iodinated Diagnostic Agents] Nausea And Vomiting      Medication List       Accurate as of April 22, 2020 11:39 AM. If you have any questions, ask your  nurse or doctor.        acetaminophen 500 MG tablet Commonly known as: TYLENOL Take 500 mg by mouth every 6 (six) hours as needed for mild pain or moderate pain.   CALCIUM/VITAMIN D3/ADULT GUMMY PO Take 1 tablet by mouth See admin instructions. 500 mg calcium, 1000 iu vitamin D3, Phosphorous 244m. 2 daily   cholecalciferol 25 MCG (1000 UNIT) tablet Commonly known as: VITAMIN D3 Take 1,000 Units by mouth daily.   [employer] JOINT HEALTH TRIPLE ACTION PO Take 1 tablet by mouth daily.   denosumab 60 MG/ML Sosy injection Commonly known as: PROLIA Inject 60 mg into the skin every 6 (six) months. On 1st Monday of every 6th mo.   famotidine 20 MG tablet Commonly known as: PEPCID Take 1 tablet (20 mg total) by mouth daily.   HM MULTIVITAMIN ADULT GUMMY PO Take 3 tablets by mouth daily.   OCUVITE EYE + MULTI PO Take 1 tablet by mouth daily.   NON FORMULARY Iron plus Vitamin C tablet; 621m125mg; amt: 6524m25mg; oral Once A Day   NON FORMULARY Heal and Soothe capsule; 300 mg; amt: 900 mg; oral Once A Day   potassium chloride SA 20 MEQ tablet Commonly known as: KLOR-CON Take 40 mEq by mouth daily. Dilute tablets in 5ml17m water to dissolve   predniSONE 5 MG tablet Commonly known as: DELTASONE Take 1 tablet (5 mg total) by mouth daily.   torsemide 20 MG tablet Commonly known as: DEMADEX Take 20 mg by mouth daily.   zinc oxide 20 % ointment Apply 1 application topically 2 (two) times daily as needed for irritation.       Review of Systems  Review of Systems  Constitutional: Negative for activity change, appetite change, chills, diaphoresis, fatigue and fever.  HENT: Negative for mouth sores, postnasal drip, rhinorrhea, sinus pain and sore throat.   Respiratory: Negative for apnea, cough, chest tightness, shortness of breath and wheezing.   Cardiovascular: Negative for chest pain, palpitations and leg swelling.  Gastrointestinal: Negative for abdominal distention,  abdominal pain, constipation, diarrhea, nausea and vomiting.  Genitourinary: Negative for dysuria and frequency.  Musculoskeletal: Negative for arthralgias, joint swelling and myalgias.  Skin: Negative for rash.  Neurological: Negative for dizziness, syncope, weakness, light-headedness and numbness.  Psychiatric/Behavioral: Negative for behavioral problems, confusion and sleep disturbance.     Immunization History  Administered Date(s) Administered  . Influenza Split 09/11/2012, 04/12/2013, 04/30/2015  . Influenza, High Dose Seasonal PF 04/18/2017, 04/25/2019  . Influenza,inj,Quad PF,6+ Mos 04/13/2018  . Influenza-Unspecified 09/19/2012, 05/03/2014, 04/10/2015, 04/22/2016, 04/20/2017  . Moderna SARS-COVID-2 Vaccination 07/16/2019, 08/13/2019  . Pneumococcal Conjugate-13 04/16/2015  . Pneumococcal Polysaccharide-23 07/12/2004  . Td 01/10/2012  . Tdap 11/05/2015   Pertinent  Health Maintenance Due  Topic Date Due  . INFLUENZA VACCINE  02/10/2020  .  DEXA SCAN  Completed  . PNA vac Low Risk Adult  Completed   Fall Risk  10/23/2019 10/10/2019 09/12/2019 08/15/2019 07/18/2019  Falls in the past year? 0 0 1 0 0  Comment - - - - -  Number falls in past yr: 0 0 1 0 0  Comment - - - - -  Injury with Fall? - - 0 - -   Functional Status Survey:    Vitals:   04/22/20 1135  BP: 128/74  Pulse: 68  Resp: (!) 22  Temp: 98.2 F (36.8 C)  SpO2: 97%  Weight: 136 lb 6.4 oz (61.9 kg)  Height: 5' (1.524 m)   Body mass index is 26.64 kg/m. Physical Exam Vitals reviewed.  Constitutional:      Appearance: Normal appearance.  HENT:     Head: Normocephalic.     Nose: Nose normal.     Mouth/Throat:     Mouth: Mucous membranes are moist.     Pharynx: Oropharynx is clear.  Eyes:     Pupils: Pupils are equal, round, and reactive to light.  Cardiovascular:     Rate and Rhythm: Normal rate and regular rhythm.     Pulses: Normal pulses.  Pulmonary:     Effort: Pulmonary effort is normal. No  respiratory distress.     Breath sounds: Normal breath sounds.  Abdominal:     General: Abdomen is flat. Bowel sounds are normal.     Palpations: Abdomen is soft.  Musculoskeletal:        General: Swelling present.     Cervical back: Neck supple.  Skin:    General: Skin is warm.  Neurological:     General: No focal deficit present.     Mental Status: She is alert and oriented to person, place, and time.  Psychiatric:        Mood and Affect: Mood normal.        Thought Content: Thought content normal.     Labs reviewed: Recent Labs    10/03/19 0924 10/20/19 0610 10/20/19 0610 10/21/19 0126 10/21/19 0126 10/22/19 0113 11/05/19 0748 11/27/19 0000 12/19/19 0000  NA   < > 143   < > 142   < > 141 141 140 143  K   < > 3.2*   < > 4.2   < > 4.0 3.4* 3.8 3.8  CL   < > 100   < > 97*   < > 98 101 104 101  CO2   < > 32   < > 35*   < > 33* 33* 31* 35*  GLUCOSE  --  116*   < > 127*  --  125* 92  --   --   BUN   < > 27*   < > 29*   < > 26* 30* 21 24*  CREATININE   < > 0.71   < > 0.69   < > 0.60 0.61 0.6 0.7  CALCIUM   < > 9.4   < > 10.0   < > 9.7 9.6 9.6 9.7  MG  --  2.0  --   --   --   --   --   --   --    < > = values in this interval not displayed.   Recent Labs    08/10/19 0934 08/10/19 0934 10/03/19 0924 11/05/19 0748 11/27/19 0000  AST 12   < > '13 18 16  ' ALT 6   < > 8  12 16  ALKPHOS  --   --   --   --  59  BILITOT 0.4  --  0.4 0.6  --   PROT 6.4  --  6.2 6.3  --   ALBUMIN  --   --   --   --  3.4*   < > = values in this interval not displayed.   Recent Labs    08/10/19 0934 08/10/19 0934 10/03/19 0924 10/18/19 0047 10/20/19 0610 10/20/19 0610 10/21/19 0126 11/05/19 0748 11/27/19 0000  WBC 7.4   < > 5.0   < > 9.9   < > 10.0 5.3 5.9  NEUTROABS 4,381  --  2,785  --   --   --   --  2,533  --   HGB 9.7*   < > 11.3*   < > 12.1   < > 13.4 12.6 12.2  HCT 29.8*   < > 34.5*   < > 38.3   < > 42.6 38.2 37  MCV 96.8   < > 96.1   < > 101.6*  --  100.9* 96.7  --   PLT  183   < > 137*   < > 94*   < > 105* 109* 134*   < > = values in this interval not displayed.   Lab Results  Component Value Date   TSH 0.517 10/18/2019   Lab Results  Component Value Date   HGBA1C 5.4 02/26/2019   Lab Results  Component Value Date   CHOL 129 02/26/2019   HDL 39 (L) 02/26/2019   LDLCALC 76 02/26/2019   TRIG 65 02/26/2019   CHOLHDL 3.3 02/26/2019    Significant Diagnostic Results in last 30 days:  No results found.  Assessment/Plan Polymyalgia rheumatica [facility]) Per Rheumatology Dr Estanislado Pandy will try Slowly decreasing her Prednisone Will decrease it to 4 mg QD for now  Obstructive sleep apnea syndrome Doing well on CPAP Achalasia of esophagus Tolerating Puree Diet. She will d/w Dr Joylene Igo if its ok to sometimes eat non Puree diet On Pepcid Age-related osteoporosis without current pathological fracture Toleraing Prolia Bilateral leg edema On Low dose of Demadex Hallucinations Visual Mostly Resolved.  She has some things that she sees which seems more related ot her Vision issues  S/P Pacemaker Follows with Cardiology History of Compression Fecture  No Pain Walks with her walker     Family/ staff Communication:   Labs/tests ordered:  CMP, CBC, CRP,ESR in 4 weeks

## 2020-05-20 DIAGNOSIS — Z23 Encounter for immunization: Secondary | ICD-10-CM | POA: Diagnosis not present

## 2020-05-21 LAB — BASIC METABOLIC PANEL
BUN: 22 — AB (ref 4–21)
CO2: 31 — AB (ref 13–22)
Chloride: 103 (ref 99–108)
Creatinine: 0.7 (ref 0.5–1.1)
Glucose: 89
Potassium: 3.7 (ref 3.4–5.3)
Sodium: 142 (ref 137–147)

## 2020-05-21 LAB — HEPATIC FUNCTION PANEL
ALT: 8 (ref 7–35)
AST: 11 — AB (ref 13–35)
Alkaline Phosphatase: 59 (ref 25–125)
Bilirubin, Total: 0.6

## 2020-05-21 LAB — COMPREHENSIVE METABOLIC PANEL
Albumin: 3.6 (ref 3.5–5.0)
Calcium: 9.2 (ref 8.7–10.7)
GFR calc Af Amer: 89
GFR calc non Af Amer: 77
Globulin: 2.8

## 2020-05-21 LAB — CBC AND DIFFERENTIAL
HCT: 40 (ref 36–46)
Hemoglobin: 13.4 (ref 12.0–16.0)
Neutrophils Absolute: 2479
Platelets: 130 — AB (ref 150–399)
WBC: 4.9

## 2020-05-21 LAB — POCT ERYTHROCYTE SEDIMENTATION RATE, NON-AUTOMATED: Sed Rate: 31

## 2020-05-21 LAB — CBC: RBC: 4 (ref 3.87–5.11)

## 2020-07-01 DIAGNOSIS — Q6689 Other  specified congenital deformities of feet: Secondary | ICD-10-CM | POA: Diagnosis not present

## 2020-07-01 DIAGNOSIS — M79671 Pain in right foot: Secondary | ICD-10-CM | POA: Diagnosis not present

## 2020-07-01 DIAGNOSIS — B351 Tinea unguium: Secondary | ICD-10-CM | POA: Diagnosis not present

## 2020-07-01 DIAGNOSIS — M79672 Pain in left foot: Secondary | ICD-10-CM | POA: Diagnosis not present

## 2020-08-01 ENCOUNTER — Encounter: Payer: Self-pay | Admitting: Nurse Practitioner

## 2020-08-01 ENCOUNTER — Non-Acute Institutional Stay: Payer: Medicare Other | Admitting: Nurse Practitioner

## 2020-08-01 DIAGNOSIS — R441 Visual hallucinations: Secondary | ICD-10-CM

## 2020-08-01 DIAGNOSIS — D696 Thrombocytopenia, unspecified: Secondary | ICD-10-CM

## 2020-08-01 DIAGNOSIS — I5031 Acute diastolic (congestive) heart failure: Secondary | ICD-10-CM

## 2020-08-01 DIAGNOSIS — G4733 Obstructive sleep apnea (adult) (pediatric): Secondary | ICD-10-CM | POA: Diagnosis not present

## 2020-08-01 DIAGNOSIS — M8580 Other specified disorders of bone density and structure, unspecified site: Secondary | ICD-10-CM | POA: Diagnosis not present

## 2020-08-01 DIAGNOSIS — K220 Achalasia of cardia: Secondary | ICD-10-CM | POA: Diagnosis not present

## 2020-08-01 DIAGNOSIS — M353 Polymyalgia rheumatica: Secondary | ICD-10-CM

## 2020-08-01 DIAGNOSIS — E876 Hypokalemia: Secondary | ICD-10-CM | POA: Diagnosis not present

## 2020-08-01 NOTE — Assessment & Plan Note (Signed)
Thrombocytopenia, plt 100s,    

## 2020-08-01 NOTE — Assessment & Plan Note (Signed)
Dysphagia diet/achalasia of esophagus, s/p dilation, on Pepcid, Pureed diet  °
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
## 2020-08-01 NOTE — Assessment & Plan Note (Signed)
Sleep apnea, CPAP/O2 at night

## 2020-08-01 NOTE — Assessment & Plan Note (Signed)
Continue Prolia

## 2020-08-01 NOTE — Assessment & Plan Note (Signed)
Hx of PMR on Prednisone, saw rheumatology 02/26/20    
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
## 2020-08-01 NOTE — Assessment & Plan Note (Signed)
Hypokalemia replete, on Kcl 17meq qd, K wnl 05/2020

## 2020-08-01 NOTE — Assessment & Plan Note (Signed)
CHF, last hospitalization 10/18/19-10/22/19 edema BLE, BNP 122,  echocradiogram EF 16-10%, grade I diastolic dysfunction. CXR bilateral effusions, on Torsemide 20mg  qd.Bun/creat 22/0.70 05/20/20

## 2020-08-04 ENCOUNTER — Encounter: Payer: Self-pay | Admitting: Nurse Practitioner

## 2020-08-05 ENCOUNTER — Non-Acute Institutional Stay: Payer: Medicare Other | Admitting: Internal Medicine

## 2020-08-05 ENCOUNTER — Encounter: Payer: Self-pay | Admitting: Internal Medicine

## 2020-08-05 DIAGNOSIS — M353 Polymyalgia rheumatica: Secondary | ICD-10-CM | POA: Diagnosis not present

## 2020-08-05 DIAGNOSIS — K220 Achalasia of cardia: Secondary | ICD-10-CM

## 2020-08-05 DIAGNOSIS — I10 Essential (primary) hypertension: Secondary | ICD-10-CM | POA: Diagnosis not present

## 2020-08-05 DIAGNOSIS — I5032 Chronic diastolic (congestive) heart failure: Secondary | ICD-10-CM

## 2020-08-05 DIAGNOSIS — R441 Visual hallucinations: Secondary | ICD-10-CM

## 2020-08-05 LAB — BASIC METABOLIC PANEL
BUN: 24 — AB (ref 4–21)
CO2: 30 — AB (ref 13–22)
Chloride: 106 (ref 99–108)
Creatinine: 0.7 (ref 0.5–1.1)
Glucose: 93
Potassium: 4.1 (ref 3.4–5.3)
Sodium: 143 (ref 137–147)

## 2020-08-05 LAB — COMPREHENSIVE METABOLIC PANEL
Albumin: 3.4 — AB (ref 3.5–5.0)
Calcium: 9 (ref 8.7–10.7)
Globulin: 2.5

## 2020-08-05 LAB — HEPATIC FUNCTION PANEL
ALT: 9 (ref 7–35)
AST: 15 (ref 13–35)
Alkaline Phosphatase: 51 (ref 25–125)
Bilirubin, Total: 0.4

## 2020-08-05 LAB — TSH: TSH: 0.71 (ref 0.41–5.90)

## 2020-08-05 LAB — CBC AND DIFFERENTIAL
HCT: 36 (ref 36–46)
Hemoglobin: 12.5 (ref 12.0–16.0)
Neutrophils Absolute: 3329
Platelets: 136 — AB (ref 150–399)
WBC: 5.7

## 2020-08-05 LAB — CBC: RBC: 3.76 — AB (ref 3.87–5.11)

## 2020-08-05 NOTE — Progress Notes (Signed)
Location:   [place] Room Number: [place] of Service:  [id number] [phone]) Provider: Veleta Miners MD  Jill Dad, MD  Patient Care Team: Jill Dad, MD as PCP - General (Internal Medicine) Melina Modena, [facility] [name], MD as Consulting Physician (Cardiology) Irine Seal, MD as Attending Physician (Urology) Clance, Armando Reichert, MD as Consulting Physician (Pulmonary Disease) Grace Isaac, MD as Consulting Physician (Cardiothoracic Surgery) Mauri Pole, MD as Consulting Physician (Gastroenterology)  Extended Emergency Contact Information Primary Emergency Contact: Coffelt,Francis Address: [address]          [place], [place] [address] [place] Phone: [phone] Relation: Spouse Secondary Emergency Contact: Cherubini,Dorothy Address: [address]          [place],  [address] [place] Phone: [phone] Mobile Phone: [phone] Relation: Daughter  Code Status:  DNR Goals of care: Advanced Directive information Advanced Directives 08/01/2020  Does Patient Have a Medical Advance Directive? Yes  Type of Paramedic of [place];Living will;Out of facility DNR (pink MOST or yellow form)  Does patient want to make changes to medical advance directive? No - Patient declined  Copy of [place] in Chart? Yes - validated most recent copy scanned in chart (See row information)  Would patient like information on creating a medical advance directive? -  Pre-existing out of facility DNR order (yellow form or pink MOST form) Pink MOST form placed in chart (order not valid for inpatient use);Yellow form placed in chart (order not valid for inpatient use)     Chief Complaint  Patient presents with  . Medical Management of Chronic Issues    HPI:  Pt is a 87 y.o. female seen today for medical management of chronic diseases.    Patient has a history of  PMR, esophageal dysphagia, diastolic CHF, s/p pacemaker, anemia, hallucinations visual, osteoporosis, compression fracture, OSA  PMR ON Low dose Prednisone But c/o Pain in her Proximal Muscle stffness and difficulty getting up and Walking  Visual Hallucinations This is her Chronic Problem. Have d/w family before and they have opted for no further Work up  [place] as noticed by Husband.He says she is sleeping more.  Was recently started on Remeron as suggested by Psych for Depression and Insomnia. Patient appetite remains  good and has gained weight  NO SOB or cough Tolerating PO diet  Past Medical History:  Diagnosis Date  . Achalasia   . Anemia   . Arthritis    back  . Chronic steroid use   . Complete heart block ([place])    S/P PACEMAKER 2000 W/ GENERATOR CHANGE 2009  . Empyema, right ([place]) PULMOLOGIST-- DR CLANCE   VATS 06/23/2012 cultures negative to date CXR 07/19/12 persistent airfluid levels/  CXR 11-01-2012 IMPROVE RIGHT PLEURAL EFFUSION  . GERD (gastroesophageal reflux disease)   . History of aspiration pneumonitis    DEC 2013  . History of hiatal hernia   . Hypertension   . Inguinal hernia    right  . Intrinsic urethral sphincter deficiency   . Megaesophagus   . Mixed stress and urge urinary incontinence   . Multinodular thyroid 06/26/2012   Multi nodular goiter. Large nodules in both lobes of the gland.  These nodules fit national criteria for fine needle aspiration  biopsy if not previously assessed.    . OSA on CPAP    cpap, doees not know settings  . Polymyalgia  rheumatica (HCC)    on chronic Prednisone 5mg  daily  . RBBB   . S/P dilatation of esophageal stricture    Past Surgical History:  Procedure Laterality Date  . APPENDECTOMY  1953   w/ removal benign kidney tumor   . BALLOON DILATION  07/24/2012   Procedure: BALLOON DILATION;  Surgeon: Inda Castle, MD;  Location: [facility];  Service: Endoscopy;  Laterality: N/A;  . BALLOON DILATION N/A  12/03/2015   Procedure: BALLOON DILATION;  Surgeon: Mauri Pole, MD;  Location: [facility];  Service: Endoscopy;  Laterality: N/A;  pnuematic balloon  . BALLOON DILATION N/A 03/16/2017   Procedure: BALLOON DILATION;  Surgeon: Mauri Pole, MD;  Location: [facility];  Service: Endoscopy;  Laterality: N/A;  PNUEMATIC BALLOONS  . BOTOX INJECTION  08/07/2012   Procedure: BOTOX INJECTION;  Surgeon: Inda Castle, MD;  Location: [facility];  Service: Endoscopy;  Laterality: N/A;  . BOTOX INJECTION N/A 05/24/2013   Procedure: MACROPLASTIQUE IMPLANT;  Surgeon: Irine Seal, MD;  Location[facility];  Service: Urology;  Laterality: N/A;  . BOTOX INJECTION  02/25/2014   Procedure: BOTOX INJECTION;  Surgeon: Inda Castle, MD;  Location: [facility];  Service: Endoscopy;;  . CARDIAC PACEMAKER PLACEMENT  06/1999  DR RUTH GREENFIELD AT [facility]  ( LAST PACER CHECK 05-09-2013) for CHB/   END-OF-LIFE GENERATOR CHANGE  2009  . CATARACT EXTRACTION W/ INTRAOCULAR LENS  IMPLANT, BILATERAL  2005  . DILATION AND CURETTAGE OF UTERUS    . ESOPHAGOGASTRODUODENOSCOPY  07/24/2012   Procedure: ESOPHAGOGASTRODUODENOSCOPY (EGD);  Surgeon: Inda Castle, MD;  Location: [facility];  Service: Endoscopy;  Laterality: N/A;  . ESOPHAGOGASTRODUODENOSCOPY  08/07/2012   Procedure: ESOPHAGOGASTRODUODENOSCOPY (EGD);  Surgeon: Inda Castle, MD;  Location: [facility];  Service: Endoscopy;  Laterality: N/A;  . ESOPHAGOGASTRODUODENOSCOPY N/A 02/25/2014   Procedure: ESOPHAGOGASTRODUODENOSCOPY (EGD);  Surgeon: Inda Castle, MD;  Location: [facility];  Service: Endoscopy;  Laterality: N/A;  . ESOPHAGOGASTRODUODENOSCOPY N/A 03/16/2017   Procedure: ESOPHAGOGASTRODUODENOSCOPY (EGD);  Surgeon: Mauri Pole, MD;  Location[facility];  Service: Endoscopy;  Laterality: N/A;  . ESOPHAGOGASTRODUODENOSCOPY N/A 08/02/2018   Procedure: ESOPHAGOGASTRODUODENOSCOPY (EGD);  Surgeon: Mauri Pole, MD;  Location: [facility];  Service: Endoscopy;  Laterality: N/A;  . ESOPHAGOGASTRODUODENOSCOPY (EGD) WITH ESOPHAGEAL DILATION  06/27/2012   Procedure: ESOPHAGOGASTRODUODENOSCOPY (EGD) WITH ESOPHAGEAL DILATION;  Surgeon: Ladene Artist, MD,FACG;  Location: [facility];  Service: Endoscopy;  Laterality: N/A;  . ESOPHAGOGASTRODUODENOSCOPY (EGD) WITH PROPOFOL N/A 10/09/2015   Procedure: ESOPHAGOGASTRODUODENOSCOPY (EGD) WITH PROPOFOL ( WITH BOTOX);  Surgeon: Milus Banister, MD;  Location: [facility];  Service: Endoscopy;  Laterality: N/A;  . ESOPHAGOGASTRODUODENOSCOPY (EGD) WITH PROPOFOL N/A 12/03/2015   Procedure: ESOPHAGOGASTRODUODENOSCOPY (EGD) WITH PROPOFOL;  Surgeon: Mauri Pole, MD;  Location: [facility];  Service: Endoscopy;  Laterality: N/A;  . FOREIGN BODY REMOVAL  08/02/2018   Procedure: FOREIGN BODY REMOVAL;  Surgeon: Mauri Pole, MD;  Location: [facility];  Service: Endoscopy;;  . PACEMAKER GENERATOR CHANGE  12/13/2007   at [facility]  . TOE SURGERY  2013   left 3rd toe HAMMERTOE REPAIR  . TONSILLECTOMY  AS CHILD  . TOTAL HIP ARTHROPLASTY Right 2001  . VERICOSE VEIN LIGATION    . VIDEO ASSISTED THORACOSCOPY (VATS)/DECORTICATION  06/23/2012   Procedure: VIDEO ASSISTED THORACOSCOPY (VATS)/DECORTICATION;  Surgeon: Grace Isaac, MD;  Location: [facility];  Service: Thoracic;  Laterality: Right;  [place] VIDEO ASSISTED THORACOSCOPY (VATS)/EMPYEMA  06/23/2012  . VIDEO BRONCHOSCOPY  06/23/2012   Procedure: VIDEO BRONCHOSCOPY;  Surgeon: Grace Isaac, MD;  Location: [facility] OR;  Service: Thoracic;  Laterality: N/A;    Allergies  Allergen Reactions  . Actonel [Risedronate Sodium] Other (See Comments)    Joint aches; rechallenged --caused joint aches  . Ivp Dye [Iodinated Diagnostic Agents] Nausea And Vomiting    Allergies as of 08/05/2020      Reactions   Actonel [risedronate Sodium] Other (See Comments)   Joint aches; rechallenged --caused joint aches   Ivp Dye  [iodinated Diagnostic Agents] Nausea And Vomiting      Medication List       Accurate as of August 05, 2020  1:45 PM. If you have any questions, ask your nurse or doctor.        acetaminophen 500 MG tablet Commonly known as: TYLENOL Take 500 mg by mouth every 6 (six) hours as needed for mild pain or moderate pain.   CALCIUM/VITAMIN D3/ADULT GUMMY PO Take 1 tablet by mouth See admin instructions. 500 mg calcium, 1000 iu vitamin D3, Phosphorous 230mg . 2 daily   cholecalciferol 25 MCG (1000 UNIT) tablet Commonly known as: VITAMIN D3 Take 1,000 Units by mouth daily.   [employer] JOINT HEALTH TRIPLE ACTION PO Take 1 tablet by mouth daily.   denosumab 60 MG/ML Sosy injection Commonly known as: PROLIA Inject 60 mg into the skin every 6 (six) months. On 1st Monday of every 6th mo.   famotidine 20 MG tablet Commonly known as: PEPCID Take 1 tablet (20 mg total) by mouth daily.   HM MULTIVITAMIN ADULT GUMMY PO Take 3 tablets by mouth daily.   OCUVITE EYE + MULTI PO Take 1 tablet by mouth daily.   mirtazapine 15 MG tablet Commonly known as: REMERON Take 15 mg by mouth at bedtime.   NON FORMULARY Iron plus Vitamin C tablet; 65mg /125mg ; amt: 65mg /125mg ; oral Once A Day   NON FORMULARY Heal and Soothe capsule; 300 mg; amt: 900 mg; oral Once A Day   potassium chloride SA 20 MEQ tablet Commonly known as: KLOR-CON Take 40 mEq by mouth daily. Dilute tablets in 70ml of water to dissolve   predniSONE 1 MG tablet Commonly known as: DELTASONE Take 4 mg by mouth daily with breakfast. Take 4 tablets to = 4 mg   torsemide 20 MG tablet Commonly known as: DEMADEX Take 20 mg by mouth daily.   zinc oxide 20 % ointment Apply 1 application topically 2 (two) times daily as needed for irritation.       Review of Systems  Constitutional: Positive for activity change.  HENT: Negative.   Respiratory: Negative.   Cardiovascular: Positive for leg swelling.  Gastrointestinal: Negative.    Genitourinary: Negative.   Musculoskeletal: Positive for arthralgias and myalgias.  Skin: Negative.   Neurological: Positive for weakness.  Psychiatric/Behavioral: Positive for hallucinations.    Immunization History  Administered Date(s) Administered  . Influenza Split 09/11/2012, 04/12/2013, 04/30/2015  . Influenza, High Dose Seasonal PF 04/18/2017, 04/25/2019  . Influenza,inj,Quad PF,6+ Mos 04/13/2018  . Influenza-Unspecified 09/19/2012, 05/03/2014, 04/22/2016, 04/20/2017, 04/22/2020  . Moderna Sars-Covid-2 Vaccination 07/16/2019, 08/13/2019, 05/20/2020  . Pneumococcal Conjugate-13 04/16/2015  . Pneumococcal Polysaccharide-23 07/12/2004  . Td 01/10/2012  . Tdap 11/05/2015   Pertinent  Health Maintenance Due  Topic Date Due  . INFLUENZA VACCINE  Completed  . DEXA SCAN  Completed  . PNA vac Low Risk Adult  Completed   Fall Risk  10/23/2019 10/10/2019 09/12/2019 08/15/2019 07/18/2019  Falls in the past year? 0 0 1 0 0  Comment - - - - -  Number falls in past yr: 0 0 1 0 0  Comment - - - - -  Injury with Fall? - - 0 - -   Functional Status Survey:    Vitals:   08/05/20 1330  BP: 122/66  Pulse: 67  Resp: 20  Temp: 98.5 F (36.9 C)  SpO2: 95%  Weight: 145 lb 6.4 oz (66 kg)  Height: 5' (1.524 m)   Body mass index is 28.4 kg/m. Physical Exam Vitals reviewed.  Constitutional:      Appearance: Normal appearance.  HENT:     Head: Normocephalic.     Nose: Nose normal.     Mouth/Throat:     Mouth: Mucous membranes are moist.     Pharynx: Oropharynx is clear.  Eyes:     Pupils: Pupils are equal, round, and reactive to light.  Cardiovascular:     Rate and Rhythm: Normal rate.     Pulses: Normal pulses.  Pulmonary:     Effort: Pulmonary effort is normal.     Breath sounds: Normal breath sounds.  Abdominal:     General: Abdomen is flat. Bowel sounds are normal.     Palpations: Abdomen is soft.  Musculoskeletal:     Cervical back: Neck supple.     Comments: Mild  Edema Bilateral  Skin:    General: Skin is warm.  Neurological:     General: No focal deficit present.     Mental Status: She is alert and oriented to person, place, and time.  Psychiatric:        Mood and Affect: Mood normal.        Thought Content: Thought content normal.     Labs reviewed: Recent Labs    10/20/19 0610 10/21/19 0126 10/22/19 0113 11/05/19 0748 11/27/19 0000 12/19/19 0000 05/21/20 0000  NA 143 142 141 141 140 143 142  K 3.2* 4.2 4.0 3.4* 3.8 3.8 3.7  CL 100 97* 98 101 104 101 103  CO2 32 35* 33* 33* 31* 35* 31*  GLUCOSE 116* 127* 125* 92  --   --   --   BUN 27* 29* 26* 30* 21 24* 22*  CREATININE 0.71 0.69 0.60 0.61 0.6 0.7 0.7  CALCIUM 9.4 10.0 9.7 9.6 9.6 9.7 9.2  MG 2.0  --   --   --   --   --   --    Recent Labs    08/10/19 0934 10/03/19 0924 11/05/19 0748 11/27/19 0000 05/21/20 0000  AST 12 13 18 16  11*  ALT 6 8 12 16 8   ALKPHOS  --   --   --  59 59  BILITOT 0.4 0.4 0.6  --   --   PROT 6.4 6.2 6.3  --   --   ALBUMIN  --   --   --  3.4* 3.6   Recent Labs    10/03/19 0924 10/18/19 0047 10/20/19 0610 10/21/19 0126 11/05/19 0748 11/27/19 0000 05/21/20 0000  WBC 5.0   < > 9.9 10.0 5.3 5.9 4.9  NEUTROABS 2,785  --   --   --  2,533  --  2,479.00  HGB 11.3*   < > 12.1 13.4 12.6 12.2 13.4  HCT 34.5*   < > 38.3 42.6 38.2 37 40  MCV 96.1   < > 101.6* 100.9* 96.7  --   --   PLT 137*   < >  94* 105* 109* 134* 130*   < > = values in this interval not displayed.   Lab Results  Component Value Date   TSH 0.517 10/18/2019   Lab Results  Component Value Date   HGBA1C 5.4 02/26/2019   Lab Results  Component Value Date   CHOL 129 02/26/2019   HDL 39 (L) 02/26/2019   LDLCALC 76 02/26/2019   TRIG 65 02/26/2019   CHOLHDL 3.3 02/26/2019    Significant Diagnostic Results in last 30 days:  No results found.  Assessment/Plan Polymyalgia rheumatica (HCC) Flare up Check CRP I has decrased her Prednisone to 4mg  from 5 mg few months  ago Change Prednisone to 10 mg QD Visual hallucination This is Chronic issue with patient Related more to Prednisone dosing Family has refused Further Work up Before  Achalasia of esophagus Has gained weight Eats Puree diet  Chronic diastolic congestive heart failure ([place]) On Toresimide Depression Was started on Remeron And Now c/o Sleepiness Will discontinue for now Osteoporosis On Prolia Obstructive sleep apnea syndrome Doing well on CPAP S/P Pacemaker Follows with Cardiology History of Compression Fracture    Family/ staff Communication:   Labs/tests ordered:  CBC,CMP,CRP

## 2020-08-07 DIAGNOSIS — M353 Polymyalgia rheumatica: Secondary | ICD-10-CM | POA: Diagnosis not present

## 2020-08-07 LAB — POCT ERYTHROCYTE SEDIMENTATION RATE, NON-AUTOMATED: Sed Rate: 36

## 2020-08-25 DIAGNOSIS — H353211 Exudative age-related macular degeneration, right eye, with active choroidal neovascularization: Secondary | ICD-10-CM | POA: Diagnosis not present

## 2020-08-25 DIAGNOSIS — H35372 Puckering of macula, left eye: Secondary | ICD-10-CM | POA: Diagnosis not present

## 2020-08-25 DIAGNOSIS — H353122 Nonexudative age-related macular degeneration, left eye, intermediate dry stage: Secondary | ICD-10-CM | POA: Diagnosis not present

## 2020-08-25 DIAGNOSIS — H35033 Hypertensive retinopathy, bilateral: Secondary | ICD-10-CM | POA: Diagnosis not present

## 2020-09-04 DIAGNOSIS — R1319 Other dysphagia: Secondary | ICD-10-CM | POA: Diagnosis not present

## 2020-09-04 DIAGNOSIS — R1310 Dysphagia, unspecified: Secondary | ICD-10-CM | POA: Diagnosis not present

## 2020-09-10 DIAGNOSIS — R1319 Other dysphagia: Secondary | ICD-10-CM | POA: Diagnosis not present

## 2020-09-10 DIAGNOSIS — R1310 Dysphagia, unspecified: Secondary | ICD-10-CM | POA: Diagnosis not present

## 2020-09-16 DIAGNOSIS — R1319 Other dysphagia: Secondary | ICD-10-CM | POA: Diagnosis not present

## 2020-09-16 DIAGNOSIS — R1310 Dysphagia, unspecified: Secondary | ICD-10-CM | POA: Diagnosis not present

## 2020-10-02 ENCOUNTER — Encounter: Payer: Self-pay | Admitting: Nurse Practitioner

## 2020-10-02 ENCOUNTER — Non-Acute Institutional Stay: Payer: Medicare Other | Admitting: Nurse Practitioner

## 2020-10-02 DIAGNOSIS — M353 Polymyalgia rheumatica: Secondary | ICD-10-CM

## 2020-10-02 DIAGNOSIS — I5032 Chronic diastolic (congestive) heart failure: Secondary | ICD-10-CM | POA: Diagnosis not present

## 2020-10-02 DIAGNOSIS — R441 Visual hallucinations: Secondary | ICD-10-CM | POA: Diagnosis not present

## 2020-10-02 DIAGNOSIS — K5901 Slow transit constipation: Secondary | ICD-10-CM

## 2020-10-02 DIAGNOSIS — R1319 Other dysphagia: Secondary | ICD-10-CM

## 2020-10-02 DIAGNOSIS — M8580 Other specified disorders of bone density and structure, unspecified site: Secondary | ICD-10-CM | POA: Diagnosis not present

## 2020-10-02 DIAGNOSIS — G4733 Obstructive sleep apnea (adult) (pediatric): Secondary | ICD-10-CM

## 2020-10-02 DIAGNOSIS — R300 Dysuria: Secondary | ICD-10-CM

## 2020-10-02 DIAGNOSIS — E876 Hypokalemia: Secondary | ICD-10-CM | POA: Diagnosis not present

## 2020-10-02 DIAGNOSIS — R634 Abnormal weight loss: Secondary | ICD-10-CM

## 2020-10-02 DIAGNOSIS — D696 Thrombocytopenia, unspecified: Secondary | ICD-10-CM | POA: Diagnosis not present

## 2020-10-02 LAB — C-REACTIVE PROTEIN: CRP: 15.4

## 2020-10-02 NOTE — Assessment & Plan Note (Signed)
Dysphagia diet/achalasia of esophagus, s/p dilation, on Pepcid, Pureed diet

## 2020-10-02 NOTE — Assessment & Plan Note (Signed)
Prolia

## 2020-10-02 NOTE — Assessment & Plan Note (Signed)
Hx of PMR on Prednisone, saw rheumatology 02/26/20. ESR 36, CRP 15.4 08/07/20

## 2020-10-02 NOTE — Assessment & Plan Note (Signed)
CHF, last hospitalization 10/18/19-4/12/21edema BLE, BNP 122, echocradiogram EF 26-71%, grade I diastolic dysfunction. CXR bilateral effusions, onTorsemide 20mg  qd.Bun/creat 22/0.7 05/20/20

## 2020-10-02 NOTE — Assessment & Plan Note (Signed)
Dysuria, urinary frequency, urinary urgency for 2 days, will obtain UA C/S to r/i UTI.

## 2020-10-02 NOTE — Assessment & Plan Note (Signed)
on Kcl 57meq qd, K wnl 05/2020

## 2020-10-02 NOTE — Assessment & Plan Note (Signed)
Will try MiraLax qd.

## 2020-10-02 NOTE — Assessment & Plan Note (Signed)
plt 100s,

## 2020-10-02 NOTE — Assessment & Plan Note (Signed)
CPAP/O2 at night
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
## 2020-10-03 ENCOUNTER — Encounter: Payer: Self-pay | Admitting: Nurse Practitioner

## 2020-10-03 DIAGNOSIS — I10 Essential (primary) hypertension: Secondary | ICD-10-CM | POA: Diagnosis not present

## 2020-10-04 LAB — BASIC METABOLIC PANEL
BUN: 25 — AB (ref 4–21)
CO2: 34 — AB (ref 13–22)
Chloride: 104 (ref 99–108)
Creatinine: 0.7 (ref 0.5–1.1)
Glucose: 98
Potassium: 4.2 (ref 3.4–5.3)
Sodium: 143 (ref 137–147)

## 2020-10-04 LAB — CBC: RBC: 3.79 — AB (ref 3.87–5.11)

## 2020-10-04 LAB — COMPREHENSIVE METABOLIC PANEL
Albumin: 3.5 (ref 3.5–5.0)
Calcium: 9.7 (ref 8.7–10.7)
Globulin: 2.5

## 2020-10-04 LAB — CBC AND DIFFERENTIAL
HCT: 38 (ref 36–46)
Hemoglobin: 12.7 (ref 12.0–16.0)
Neutrophils Absolute: 3295
Platelets: 130 — AB (ref 150–399)
WBC: 5.7

## 2020-10-04 LAB — HEPATIC FUNCTION PANEL
ALT: 9 (ref 7–35)
AST: 13 (ref 13–35)
Alkaline Phosphatase: 48 (ref 25–125)
Bilirubin, Total: 0.4

## 2020-10-10 ENCOUNTER — Encounter: Payer: Self-pay | Admitting: Internal Medicine

## 2020-10-10 ENCOUNTER — Non-Acute Institutional Stay: Payer: Medicare Other | Admitting: Internal Medicine

## 2020-10-10 DIAGNOSIS — M353 Polymyalgia rheumatica: Secondary | ICD-10-CM

## 2020-10-10 DIAGNOSIS — K220 Achalasia of cardia: Secondary | ICD-10-CM | POA: Diagnosis not present

## 2020-10-10 DIAGNOSIS — M810 Age-related osteoporosis without current pathological fracture: Secondary | ICD-10-CM

## 2020-10-10 DIAGNOSIS — R441 Visual hallucinations: Secondary | ICD-10-CM

## 2020-10-10 DIAGNOSIS — G4733 Obstructive sleep apnea (adult) (pediatric): Secondary | ICD-10-CM | POA: Diagnosis not present

## 2020-10-10 DIAGNOSIS — I5032 Chronic diastolic (congestive) heart failure: Secondary | ICD-10-CM | POA: Diagnosis not present

## 2020-10-10 NOTE — Progress Notes (Signed)
Location:   [facility] Room Number: [place] of Service:  ALF [phone]) Provider:  Veleta Miners MD  [name], MD  Patient Care Team: [name], MD as PCP - General (Internal Medicine) Melina Modena, [facility] [name], MD as Consulting Physician (Cardiology) Irine Seal, MD as Attending Physician (Urology) Clance, Armando Reichert, MD as Consulting Physician (Pulmonary Disease) Grace Isaac, MD (Inactive) as Consulting Physician (Cardiothoracic Surgery) Mauri Pole, MD as Consulting Physician (Gastroenterology)  Extended Emergency Contact Information Primary Emergency Contact: Bunt,Francis Address: [address]          [place], [place] [address] [place] Phone: [phone] Relation: Spouse Secondary Emergency Contact: Jurczyk,Dorothy Address: [address]          [place], [place] [address] [place] Phone: [phone] Mobile Phone: [phone] Relation: Daughter  Code Status:  DNR Goals of care: Advanced Directive information Advanced Directives 08/01/2020  Does Patient Have a Medical Advance Directive? Yes  Type of Paramedic of [place];Living will;Out of facility DNR (pink MOST or yellow form)  Does patient want to make changes to medical advance directive? No - Patient declined  Copy of [place] in Chart? Yes - validated most recent copy scanned in chart (See row information)  Would patient like information on creating a medical advance directive? -  Pre-existing out of facility DNR order (yellow form or pink MOST form) Pink MOST form placed in chart (order not valid for inpatient use);Yellow form placed in chart (order not valid for inpatient use)     Chief Complaint  Patient presents with  . Acute Visit    PMR    HPI:  Pt is a 87 y.o. female seen today for an acute visit for Follow up of her PMR   Patient has a history of PMR,  esophageal dysphagia, diastolic CHF, s/p pacemaker, anemia, hallucinations visual,osteoporosis, compression fracture, OSA  PMR I had increased her  Prednisone dose to 10 mg few Months ago due to incased CRP and Muscle weakness and Pain. She says she is feeling much better. Still has some pain but weakness is better. Walking with her walker Visual Hallucinations Continues to be Chronic issue. Gets worse with Prednisone No Further work up per family CHF  Doing good with her LE edema Esophageal Dysphagia Margaretha Sheffield On Modified Diet   Past Medical History:  Diagnosis Date  . Achalasia   . Anemia   . Arthritis    back  . Chronic steroid use   . Complete heart block ([place])    S/P PACEMAKER 2000 W/ GENERATOR CHANGE 2009  . Empyema, right ([place]) PULMOLOGIST-- DR CLANCE   VATS 06/23/2012 cultures negative to date CXR 07/19/12 persistent airfluid levels/  CXR 11-01-2012 IMPROVE RIGHT PLEURAL EFFUSION  . GERD (gastroesophageal reflux disease)   . History of aspiration pneumonitis    DEC 2013  . History of hiatal hernia   . Hypertension   . Inguinal hernia    right  . Intrinsic urethral sphincter deficiency   . Megaesophagus   . Mixed stress and urge urinary incontinence   . Multinodular thyroid 06/26/2012   Multi nodular goiter. Large nodules in both lobes of the gland.  These nodules fit national criteria for fine needle aspiration  biopsy if not previously assessed.    . OSA on CPAP    cpap, doees not know settings  . Polymyalgia rheumatica ([place])  on chronic Prednisone 5mg  daily  . RBBB   . S/P dilatation of esophageal stricture    Past Surgical History:  Procedure Laterality Date  . APPENDECTOMY  1953   w/ removal benign kidney tumor   . BALLOON DILATION  07/24/2012   Procedure: BALLOON DILATION;  Surgeon: Inda Castle, MD;  Location: [facility];  Service: Endoscopy;  Laterality: N/A;  . BALLOON DILATION N/A 12/03/2015   Procedure: BALLOON DILATION;  Surgeon: Mauri Pole, MD;  Location: [facility];  Service: Endoscopy;  Laterality: N/A;  pnuematic balloon  . BALLOON DILATION N/A 03/16/2017   Procedure: BALLOON DILATION;  Surgeon: Mauri Pole, MD;  Location: [facility];  Service: Endoscopy;  Laterality: N/A;  PNUEMATIC BALLOONS  . BOTOX INJECTION  08/07/2012   Procedure: BOTOX INJECTION;  Surgeon: Inda Castle, MD;  Location: [facility];  Service: Endoscopy;  Laterality: N/A;  . BOTOX INJECTION N/A 05/24/2013   Procedure: MACROPLASTIQUE IMPLANT;  Surgeon: Irine Seal, MD;  Location: [facility];  Service: Urology;  Laterality: N/A;  . BOTOX INJECTION  02/25/2014   Procedure: BOTOX INJECTION;  Surgeon: Inda Castle, MD;  Location: [facility];  Service: Endoscopy;;  . CARDIAC PACEMAKER PLACEMENT  06/1999  DR RUTH GREENFIELD AT [facility]  ( LAST PACER CHECK 05-09-2013) for CHB/   END-OF-LIFE GENERATOR CHANGE  2009  . CATARACT EXTRACTION W/ INTRAOCULAR LENS  IMPLANT, BILATERAL  2005  . DILATION AND CURETTAGE OF UTERUS    . ESOPHAGOGASTRODUODENOSCOPY  07/24/2012   Procedure: ESOPHAGOGASTRODUODENOSCOPY (EGD);  Surgeon: Inda Castle, MD;  Location: [facility];  Service: Endoscopy;  Laterality: N/A;  . ESOPHAGOGASTRODUODENOSCOPY  08/07/2012   Procedure: ESOPHAGOGASTRODUODENOSCOPY (EGD);  Surgeon: Inda Castle, MD;  Location: [facility];  Service: Endoscopy;  Laterality: N/A;  . ESOPHAGOGASTRODUODENOSCOPY N/A 02/25/2014   Procedure: ESOPHAGOGASTRODUODENOSCOPY (EGD);  Surgeon: Inda Castle, MD;  Location: [facility];  Service: Endoscopy;  Laterality: N/A;  . ESOPHAGOGASTRODUODENOSCOPY N/A 03/16/2017   Procedure: ESOPHAGOGASTRODUODENOSCOPY (EGD);  Surgeon: Mauri Pole, MD;  Location: [facility] ENDOSCOPY;  Service: Endoscopy;  Laterality: N/A;  . ESOPHAGOGASTRODUODENOSCOPY N/A 08/02/2018   Procedure: ESOPHAGOGASTRODUODENOSCOPY (EGD);  Surgeon: Mauri Pole, MD;  Location: [facility];  Service: Endoscopy;   Laterality: N/A;  . ESOPHAGOGASTRODUODENOSCOPY (EGD) WITH ESOPHAGEAL DILATION  06/27/2012   Procedure: ESOPHAGOGASTRODUODENOSCOPY (EGD) WITH ESOPHAGEAL DILATION;  Surgeon: Ladene Artist, MD,FACG;  Location: [facility];  Service: Endoscopy;  Laterality: N/A;  . ESOPHAGOGASTRODUODENOSCOPY (EGD) WITH PROPOFOL N/A 10/09/2015   Procedure: ESOPHAGOGASTRODUODENOSCOPY (EGD) WITH PROPOFOL ( WITH BOTOX);  Surgeon: Milus Banister, MD;  Location: [facility];  Service: Endoscopy;  Laterality: N/A;  . ESOPHAGOGASTRODUODENOSCOPY (EGD) WITH PROPOFOL N/A 12/03/2015   Procedure: ESOPHAGOGASTRODUODENOSCOPY (EGD) WITH PROPOFOL;  Surgeon: Mauri Pole, MD;  Location: [facility];  Service: Endoscopy;  Laterality: N/A;  . FOREIGN BODY REMOVAL  08/02/2018   Procedure: FOREIGN BODY REMOVAL;  Surgeon: Mauri Pole, MD;  Location: [facility];  Service: Endoscopy;;  . PACEMAKER GENERATOR CHANGE  12/13/2007   at [facility]  . TOE SURGERY  2013   left 3rd toe HAMMERTOE REPAIR  . TONSILLECTOMY  AS CHILD  . TOTAL HIP ARTHROPLASTY Right 2001  . VERICOSE VEIN LIGATION    . VIDEO ASSISTED THORACOSCOPY (VATS)/DECORTICATION  06/23/2012   Procedure: VIDEO ASSISTED THORACOSCOPY (VATS)/DECORTICATION;  Surgeon: Grace Isaac, MD;  Location: [facility];  Service: Thoracic;  Laterality: Right;  Marland Kitchen VIDEO ASSISTED THORACOSCOPY (VATS)/EMPYEMA     06/23/2012  .  VIDEO BRONCHOSCOPY  06/23/2012   Procedure: VIDEO BRONCHOSCOPY;  Surgeon: Grace Isaac, MD;  Location: [facility];  Service: Thoracic;  Laterality: N/A;    Allergies  Allergen Reactions  . Actonel [Risedronate Sodium] Other (See Comments)    Joint aches; rechallenged --caused joint aches  . Ivp Dye [Iodinated Diagnostic Agents] Nausea And Vomiting    Allergies as of 10/10/2020      Reactions   Actonel [risedronate Sodium] Other (See Comments)   Joint aches; rechallenged --caused joint aches   Ivp Dye [iodinated Diagnostic Agents] Nausea And Vomiting       Medication List       Accurate as of October 10, 2020  3:09 PM. If you have any questions, ask your nurse or doctor.        acetaminophen 500 MG tablet Commonly known as: TYLENOL Take 500 mg by mouth every 6 (six) hours as needed for mild pain or moderate pain.   CALCIUM/VITAMIN D3/ADULT GUMMY PO Take 1 tablet by mouth See admin instructions. 500 mg calcium, 1000 iu vitamin D3, Phosphorous 230mg . 2 daily   cholecalciferol 25 MCG (1000 UNIT) tablet Commonly known as: VITAMIN D3 Take 1,000 Units by mouth daily.   [employer] JOINT HEALTH TRIPLE ACTION PO Take 1 tablet by mouth daily.   denosumab 60 MG/ML Sosy injection Commonly known as: PROLIA Inject 60 mg into the skin every 6 (six) months. On 1st Monday of every 6th mo.   famotidine 20 MG tablet Commonly known as: PEPCID Take 1 tablet (20 mg total) by mouth daily.   HM MULTIVITAMIN ADULT GUMMY PO Take 3 tablets by mouth daily.   OCUVITE EYE + MULTI PO Take 1 tablet by mouth daily.   NON FORMULARY Iron plus Vitamin C tablet; 65mg /125mg ; amt: 65mg /125mg ; oral Once A Day   NON FORMULARY Heal and Soothe capsule; 300 mg; amt: 900 mg; oral Once A Day   polyethylene glycol 17 g packet Commonly known as: MIRALAX / GLYCOLAX Take 17 g by mouth daily.   potassium chloride SA 20 MEQ tablet Commonly known as: KLOR-CON Take 40 mEq by mouth daily. Dilute tablets in 40ml of water to dissolve   predniSONE 10 MG tablet Commonly known as: DELTASONE Take 10 mg by mouth daily with breakfast.   torsemide 20 MG tablet Commonly known as: DEMADEX Take 20 mg by mouth daily.   zinc oxide 20 % ointment Apply 1 application topically 2 (two) times daily as needed for irritation.       Review of Systems  Constitutional: Negative.   HENT: Negative.   Respiratory: Negative.   Cardiovascular: Positive for leg swelling.  Gastrointestinal: Negative.   Genitourinary: Negative.   Musculoskeletal: Positive for myalgias and neck stiffness.   Skin: Negative.   Neurological: Positive for weakness. Negative for dizziness.  Psychiatric/Behavioral: Positive for hallucinations and sleep disturbance.    Immunization History  Administered Date(s) Administered  . Influenza Split 09/11/2012, 04/12/2013, 04/30/2015  . Influenza, High Dose Seasonal PF 04/18/2017, 04/25/2019  . Influenza,inj,Quad PF,6+ Mos 04/13/2018  . Influenza-Unspecified 09/19/2012, 05/03/2014, 04/22/2016, 04/20/2017, 04/22/2020  . Moderna Sars-Covid-2 Vaccination 07/16/2019, 08/13/2019, 05/20/2020  . Pneumococcal Conjugate-13 04/16/2015  . Pneumococcal Polysaccharide-23 07/12/2004  . Td 01/10/2012  . Tdap 11/05/2015   Pertinent  Health Maintenance Due  Topic Date Due  . INFLUENZA VACCINE  02/09/2021  . DEXA SCAN  Completed  . PNA vac Low Risk Adult  Completed   Fall Risk  10/23/2019 10/10/2019 09/12/2019 08/15/2019 07/18/2019  Falls in the  past year? 0 0 1 0 0  Comment - - - - -  Number falls in past yr: 0 0 1 0 0  Comment - - - - -  Injury with Fall? - - 0 - -   Functional Status Survey:    Vitals:   10/10/20 1455  BP: 136/80  Pulse: 72  Resp: 18  Temp: 97.9 F (36.6 C)  SpO2: 98%  Weight: 154 lb (69.9 kg)  Height: 5' (1.524 m)   Body mass index is 30.08 kg/m. Physical Exam  Constitutional: Oriented to person, place, and time. Well-developed and well-nourished.  HENT:  Head: Normocephalic.  Mouth/Throat: Oropharynx is clear and moist.  Eyes: Pupils are equal, round, and reactive to light.  Neck: Neck supple.  Cardiovascular: Normal rate and normal heart sounds.  No murmur heard. Pulmonary/Chest: Effort normal and breath sounds normal. No respiratory distress. No wheezes. She has no rales.  Abdominal: Soft. Bowel sounds are normal. No distension. There is no tenderness. There is no rebound.  Musculoskeletal:Mild Edema Bilateral Lymphadenopathy: none Neurological: Alert and oriented to person, place, and time.  Skin: Skin is warm and dry.   Psychiatric: Normal mood and affect. Behavior is normal. Thought content normal.   Labs reviewed: Recent Labs    10/20/19 0610 10/21/19 0126 10/22/19 0113 11/05/19 0748 11/27/19 0000 05/21/20 0000 08/05/20 0000 10/04/20 0000  NA 143 142 141 141   < > 142 143 143  K 3.2* 4.2 4.0 3.4*   < > 3.7 4.1 4.2  CL 100 97* 98 101   < > 103 106 104  CO2 32 35* 33* 33*   < > 31* 30* 34*  GLUCOSE 116* 127* 125* 92  --   --   --   --   BUN 27* 29* 26* 30*   < > 22* 24* 25*  CREATININE 0.71 0.69 0.60 0.61   < > 0.7 0.7 0.7  CALCIUM 9.4 10.0 9.7 9.6   < > 9.2 9.0 9.7  MG 2.0  --   --   --   --   --   --   --    < > = values in this interval not displayed.   Recent Labs    11/05/19 0748 11/27/19 0000 05/21/20 0000 08/05/20 0000 10/04/20 0000  AST 18   < > 11* 15 13  ALT 12   < > 8 9 9   ALKPHOS  --    < > 59 51 48  BILITOT 0.6  --   --   --   --   PROT 6.3  --   --   --   --   ALBUMIN  --    < > 3.6 3.4* 3.5   < > = values in this interval not displayed.   Recent Labs    10/20/19 0610 10/21/19 0126 11/05/19 0748 11/27/19 0000 05/21/20 0000 08/05/20 0000 10/04/20 0000  WBC 9.9 10.0 5.3   < > 4.9 5.7 5.7  NEUTROABS  --   --  2,533  --  2,479.00 3,329.00 3,295.00  HGB 12.1 13.4 12.6   < > 13.4 12.5 12.7  HCT 38.3 42.6 38.2   < > 40 36 38  MCV 101.6* 100.9* 96.7  --   --   --   --   PLT 94* 105* 109*   < > 130* 136* 130*   < > = values in this interval not displayed.   Lab Results  Component Value  Date   TSH 0.71 08/05/2020   Lab Results  Component Value Date   HGBA1C 5.4 02/26/2019   Lab Results  Component Value Date   CHOL 129 02/26/2019   HDL 39 (L) 02/26/2019   LDLCALC 76 02/26/2019   TRIG 65 02/26/2019   CHOLHDL 3.3 02/26/2019    Significant Diagnostic Results in last 30 days:  No results found.  Assessment/Plan Polymyalgia rheumatica (HCC) Will decrease her dose of Prednisone to 9 mg Has seen Rheumatology Did not tolerate prednisone less then 5  mg Visual hallucination Chronic Issue Chronic diastolic congestive heart failure (HCC) Stable on Demadex Obstructive sleep apnea syndrome Uses CPAP Achalasia of esophagus Weight stable Able to eat most of everything Age-related osteoporosis without current pathological fracture On Prolia T Score in 2018was -2.9   Family/ staff Communication:   Labs/tests ordered:

## 2020-10-14 DIAGNOSIS — R319 Hematuria, unspecified: Secondary | ICD-10-CM | POA: Diagnosis not present

## 2020-10-14 DIAGNOSIS — R1310 Dysphagia, unspecified: Secondary | ICD-10-CM | POA: Diagnosis not present

## 2020-10-16 ENCOUNTER — Encounter: Payer: Self-pay | Admitting: Nurse Practitioner

## 2020-10-16 ENCOUNTER — Non-Acute Institutional Stay: Payer: Medicare Other | Admitting: Nurse Practitioner

## 2020-10-16 DIAGNOSIS — R1319 Other dysphagia: Secondary | ICD-10-CM | POA: Diagnosis not present

## 2020-10-16 DIAGNOSIS — M353 Polymyalgia rheumatica: Secondary | ICD-10-CM | POA: Diagnosis not present

## 2020-10-16 DIAGNOSIS — D696 Thrombocytopenia, unspecified: Secondary | ICD-10-CM

## 2020-10-16 DIAGNOSIS — I5032 Chronic diastolic (congestive) heart failure: Secondary | ICD-10-CM

## 2020-10-16 DIAGNOSIS — R319 Hematuria, unspecified: Secondary | ICD-10-CM | POA: Diagnosis not present

## 2020-10-16 DIAGNOSIS — R1310 Dysphagia, unspecified: Secondary | ICD-10-CM | POA: Diagnosis not present

## 2020-10-16 DIAGNOSIS — K5901 Slow transit constipation: Secondary | ICD-10-CM | POA: Diagnosis not present

## 2020-10-16 DIAGNOSIS — E876 Hypokalemia: Secondary | ICD-10-CM

## 2020-10-16 DIAGNOSIS — M8580 Other specified disorders of bone density and structure, unspecified site: Secondary | ICD-10-CM

## 2020-10-16 DIAGNOSIS — R441 Visual hallucinations: Secondary | ICD-10-CM | POA: Diagnosis not present

## 2020-10-16 DIAGNOSIS — G4733 Obstructive sleep apnea (adult) (pediatric): Secondary | ICD-10-CM
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
## 2020-10-20 NOTE — Assessment & Plan Note (Signed)
CPAP/O2 at night

## 2020-10-20 NOTE — Assessment & Plan Note (Signed)
Stable,  takes MiraLax qd. 

## 2020-10-20 NOTE — Assessment & Plan Note (Signed)
dysphagia diet/achalasia of esophagus, s/p dilation, on Pepcid, Pureed diet

## 2020-10-20 NOTE — Assessment & Plan Note (Signed)
plt 130 10/04/20

## 2020-10-20 NOTE — Assessment & Plan Note (Signed)
takes Prolia 

## 2020-10-20 NOTE — Assessment & Plan Note (Signed)
last hospitalization 10/18/19-4/12/21edema BLE, BNP 122, echocradiogram EF 59-74%, grade I diastolic dysfunction. CXR bilateral effusions, onTorsemide 20mg  qd.Bun/creat 25/0.7 10/04/20

## 2020-10-20 NOTE — Assessment & Plan Note (Signed)
Hypokalemia replete, on Kcl 65meq qd, K 4.2 10/04/20

## 2020-10-20 NOTE — Assessment & Plan Note (Signed)
on Prednisone 9mg qd since 10/10/20, saw rheumatology 02/26/20. ESR 36, CRP 15.4 08/07/20 
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
## 2020-10-22 DIAGNOSIS — R1310 Dysphagia, unspecified: Secondary | ICD-10-CM | POA: Diagnosis not present

## 2020-10-22 DIAGNOSIS — R319 Hematuria, unspecified: Secondary | ICD-10-CM | POA: Diagnosis not present

## 2020-10-28 DIAGNOSIS — R1310 Dysphagia, unspecified: Secondary | ICD-10-CM | POA: Diagnosis not present

## 2020-10-28 DIAGNOSIS — R319 Hematuria, unspecified: Secondary | ICD-10-CM | POA: Diagnosis not present

## 2020-10-30 ENCOUNTER — Telehealth: Payer: Self-pay | Admitting: *Deleted

## 2020-10-30 ENCOUNTER — Encounter: Payer: Self-pay | Admitting: Nurse Practitioner

## 2020-10-30 ENCOUNTER — Non-Acute Institutional Stay: Payer: Medicare Other | Admitting: Nurse Practitioner

## 2020-10-30 ENCOUNTER — Encounter (HOSPITAL_COMMUNITY): Payer: Self-pay

## 2020-10-30 ENCOUNTER — Emergency Department (HOSPITAL_COMMUNITY): Payer: Medicare Other

## 2020-10-30 ENCOUNTER — Emergency Department (HOSPITAL_COMMUNITY)
Admission: EM | Admit: 2020-10-30 | Discharge: 2020-10-30 | Disposition: A | Discharge status: Home / Self Care | Payer: Medicare Other | Attending: Emergency Medicine | Admitting: Emergency Medicine

## 2020-10-30 DIAGNOSIS — Z950 Presence of cardiac pacemaker: Secondary | ICD-10-CM | POA: Insufficient documentation

## 2020-10-30 DIAGNOSIS — M353 Polymyalgia rheumatica: Secondary | ICD-10-CM

## 2020-10-30 DIAGNOSIS — D696 Thrombocytopenia, unspecified: Secondary | ICD-10-CM

## 2020-10-30 DIAGNOSIS — R0902 Hypoxemia: Secondary | ICD-10-CM | POA: Diagnosis not present

## 2020-10-30 DIAGNOSIS — K5901 Slow transit constipation: Secondary | ICD-10-CM

## 2020-10-30 DIAGNOSIS — R441 Visual hallucinations: Secondary | ICD-10-CM | POA: Diagnosis not present

## 2020-10-30 DIAGNOSIS — I110 Hypertensive heart disease with heart failure: Secondary | ICD-10-CM | POA: Diagnosis not present

## 2020-10-30 DIAGNOSIS — R0789 Other chest pain: Secondary | ICD-10-CM

## 2020-10-30 DIAGNOSIS — R000 Tachycardia, unspecified: Secondary | ICD-10-CM | POA: Diagnosis not present

## 2020-10-30 DIAGNOSIS — R1319 Other dysphagia: Secondary | ICD-10-CM

## 2020-10-30 DIAGNOSIS — R072 Precordial pain: Secondary | ICD-10-CM | POA: Insufficient documentation

## 2020-10-30 DIAGNOSIS — G4733 Obstructive sleep apnea (adult) (pediatric): Secondary | ICD-10-CM

## 2020-10-30 DIAGNOSIS — I959 Hypotension, unspecified: Secondary | ICD-10-CM | POA: Diagnosis not present

## 2020-10-30 DIAGNOSIS — E876 Hypokalemia: Secondary | ICD-10-CM | POA: Diagnosis not present

## 2020-10-30 DIAGNOSIS — I495 Sick sinus syndrome: Secondary | ICD-10-CM | POA: Diagnosis not present

## 2020-10-30 DIAGNOSIS — I5032 Chronic diastolic (congestive) heart failure: Secondary | ICD-10-CM

## 2020-10-30 DIAGNOSIS — I509 Heart failure, unspecified: Secondary | ICD-10-CM | POA: Diagnosis not present

## 2020-10-30 DIAGNOSIS — M810 Age-related osteoporosis without current pathological fracture: Secondary | ICD-10-CM

## 2020-10-30 DIAGNOSIS — Z7722 Contact with and (suspected) exposure to environmental tobacco smoke (acute) (chronic): Secondary | ICD-10-CM | POA: Insufficient documentation

## 2020-10-30 DIAGNOSIS — I517 Cardiomegaly: Secondary | ICD-10-CM | POA: Diagnosis not present

## 2020-10-30 DIAGNOSIS — R079 Chest pain, unspecified: Secondary | ICD-10-CM | POA: Diagnosis not present

## 2020-10-30 DIAGNOSIS — Z96641 Presence of right artificial hip joint: Secondary | ICD-10-CM | POA: Diagnosis not present

## 2020-10-30 LAB — CBC
HCT: 42.9 % (ref 36.0–46.0)
Hemoglobin: 13.6 g/dL (ref 12.0–15.0)
MCH: 33.7 pg (ref 26.0–34.0)
MCHC: 31.7 g/dL (ref 30.0–36.0)
MCV: 106.5 fL — ABNORMAL HIGH (ref 80.0–100.0)
Platelets: 124 10*3/uL — ABNORMAL LOW (ref 150–400)
RBC: 4.03 MIL/uL (ref 3.87–5.11)
RDW: 14.4 % (ref 11.5–15.5)
WBC: 7.8 10*3/uL (ref 4.0–10.5)
nRBC: 0 % (ref 0.0–0.2)

## 2020-10-30 LAB — BASIC METABOLIC PANEL
Anion gap: 9 (ref 5–15)
BUN: 18 mg/dL (ref 8–23)
CO2: 27 mmol/L (ref 22–32)
Calcium: 8.8 mg/dL — ABNORMAL LOW (ref 8.9–10.3)
Chloride: 106 mmol/L (ref 98–111)
Creatinine, Ser: 0.74 mg/dL (ref 0.44–1.00)
GFR, Estimated: >60 mL/min (ref >60)
Glucose, Bld: 127 mg/dL — ABNORMAL HIGH (ref 70–99)
Potassium: 4 mmol/L (ref 3.5–5.1)
Sodium: 142 mmol/L (ref 135–145)

## 2020-10-30 LAB — TROPONIN I (HIGH SENSITIVITY)
Troponin I (High Sensitivity): 10 ng/L (ref <18)
Troponin I (High Sensitivity): 9 ng/L (ref <18)

## 2020-10-30 NOTE — Assessment & Plan Note (Signed)
Pacemaker  

## 2020-10-30 NOTE — ED Notes (Signed)
ED Provider at bedside. 

## 2020-10-30 NOTE — Assessment & Plan Note (Addendum)
chest pain accompanied with nausea and perspiration, on and off through last night, middle sternum, localized, temporally relieved after ASA and antiacid, but pain came back upon my examination, lasted about a minute or two. The patient denied SOB, palpitation, dizziness, headache, change of vision, cough, sputum production, stomach pain, indigestion, constipation, or diarrhea. She is afebrile, no O2 desaturation. ED eval  Daughter called and questions answered: desires pureed diet, liquid form medication, only HPOA consent for ED eval.

## 2020-10-30 NOTE — ED Provider Notes (Signed)
[facility] EMERGENCY DEPARTMENT Provider Note   CSN: [id number] Arrival date & time: 10/30/20  1134     History Chief Complaint  Patient presents with  . Chest Pain    [name] is a 87 y.o. female.  HPI    87 y/o female - with history of achalasia - GERD and prior VATS from effusion in 2013, no htn, this patient is an 87 year old femaleno dm, no CAD that she is aware of - lives at [facility] - comes in due to CP that started last night - she had substernal sharp CP that was intermittent throughout the night -the paramedics report that the patient was given aspirin prior to their arrival, full complement.  She has not had any shortness of breath diaphoresis or nausea, the pain is completely resolved by the time she got on the ambulance, her prehospital tracings revealed a right bundle branch block but no other findings  Past Medical History:  Diagnosis Date  . Achalasia   . Anemia   . Arthritis    back  . Chronic steroid use   . Complete heart block ([place])    S/P PACEMAKER 2000 W/ GENERATOR CHANGE 2009  . Empyema, right ([place]) PULMOLOGIST-- DR CLANCE   VATS 06/23/2012 cultures negative to date CXR 07/19/12 persistent airfluid levels/  CXR 11-01-2012 IMPROVE RIGHT PLEURAL EFFUSION  . GERD (gastroesophageal reflux disease)   . History of aspiration pneumonitis    DEC 2013  . History of hiatal hernia   . Hypertension   . Inguinal hernia    right  . Intrinsic urethral sphincter deficiency   . Megaesophagus   . Mixed stress and urge urinary incontinence   . Multinodular thyroid 06/26/2012   Multi nodular goiter. Large nodules in both lobes of the gland.  These nodules fit national criteria for fine needle aspiration  biopsy if not previously assessed.    . OSA on CPAP    cpap, doees not know settings  . Polymyalgia rheumatica (HCC)    on chronic Prednisone 5mg  daily  . RBBB   . S/P dilatation of esophageal stricture     Patient Active  Problem List   Diagnosis Date Noted  . Dysuria 10/02/2020  . Slow transit constipation 10/02/2020  . Visual hallucination 08/01/2020  . Weight loss 10/23/2019  . CHF (congestive heart failure) ([place]) 10/18/2019  . Pressure injury of skin 10/18/2019  . Thrombocytopenia ([place]) 10/18/2019  . Hypokalemia 10/18/2019  . Age-related osteoporosis without current pathological fracture 08/31/2017  . Overweight 08/31/2017  . Callus 08/31/2017  . S/P placement of cardiac pacemaker 04/27/2017  . Hyperlipidemia 04/27/2017  . Atrophic vaginitis 04/27/2017  . Estrogen deficiency 04/27/2017  . Bezoar   . Actinic keratoses 07/06/2016  . 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 Abnormal chest x-ray 08/18/2015  . Right inguinal hernia 04/08/2015  . Pain of right scapula 04/02/2014  . Osteopenia 11/13/2013  . Urinary incontinence 11/13/2013  . Hyperglycemia 07/17/2013  . Intrinsic sphincter deficiency 05/24/2013  . Mixed incontinence urge and stress 05/24/2013  . Reflux esophagitis 01/28/2013  . Sinoatrial node dysfunction ([place]) 01/05/2013  . Sick sinus syndrome ([place]) 01/05/2013  . Sleep apnea 11/21/2012  . Disorder of bone and cartilage 11/21/2012  . Obesity 11/21/2012  . Edema 11/21/2012  . Personal history of other diseases of circulatory system 11/21/2012  . Achalasia of esophagus 10/10/2012  . Gastric AVM 08/07/2012  . Esophageal dysphagia 06/27/2012  . Multinodular thyroid 06/26/2012  . Megaesophagus 06/20/2012  .  Polymyalgia rheumatica ([place]) 06/19/2012  . Bilateral pleural effusion 06/19/2012  . Mediastinal mass 06/19/2012  . Hammer toe 04/17/2012  . Long term current use of systemic steroids 01/27/2012  . Paroxysmal ventricular tachycardia ([place]) 01/18/2012  . Cardiac pacemaker in situ 01/18/2012  . Nonsustained ventricular tachycardia ([place]) 01/18/2012  . Sprain of wrist 05/10/2011  . Disorder of rotator cuff syndrome of shoulder and allied disorder 04/01/2011  . Bursitis of shoulder  04/01/2011    Past Surgical History:  Procedure Laterality Date  . APPENDECTOMY  1953   w/ removal benign kidney tumor   . BALLOON DILATION  07/24/2012   Procedure: BALLOON DILATION;  Surgeon: Inda Castle, MD;  Location: [facility];  Service: Endoscopy;  Laterality: N/A;  . BALLOON DILATION N/A 12/03/2015   Procedure: BALLOON DILATION;  Surgeon: Mauri Pole, MD;  Location: [facility];  Service: Endoscopy;  Laterality: N/A;  pnuematic balloon  . BALLOON DILATION N/A 03/16/2017   Procedure: BALLOON DILATION;  Surgeon: Mauri Pole, MD;  Location: [facility];  Service: Endoscopy;  Laterality: N/A;  PNUEMATIC BALLOONS  . BOTOX INJECTION  08/07/2012   Procedure: BOTOX INJECTION;  Surgeon: Inda Castle, MD;  Location: [facility];  Service: Endoscopy;  Laterality: N/A;  . BOTOX INJECTION N/A 05/24/2013   Procedure: MACROPLASTIQUE IMPLANT;  Surgeon: Irine Seal, MD;  Location: [facility];  Service: Urology;  Laterality: N/A;  . BOTOX INJECTION  02/25/2014   Procedure: BOTOX INJECTION;  Surgeon: Inda Castle, MD;  Location: [facility];  Service: Endoscopy;;  . CARDIAC PACEMAKER PLACEMENT  06/1999  DR RUTH GREENFIELD AT [place]  ( LAST PACER CHECK 05-09-2013) for CHB/   END-OF-LIFE GENERATOR CHANGE  2009  . CATARACT EXTRACTION W/ INTRAOCULAR LENS  IMPLANT, BILATERAL  2005  . DILATION AND CURETTAGE OF UTERUS    . ESOPHAGOGASTRODUODENOSCOPY  07/24/2012   Procedure: ESOPHAGOGASTRODUODENOSCOPY (EGD);  Surgeon: Inda Castle, MD;  Location: [facility];  Service: Endoscopy;  Laterality: N/A;  . ESOPHAGOGASTRODUODENOSCOPY  08/07/2012   Procedure: ESOPHAGOGASTRODUODENOSCOPY (EGD);  Surgeon: Inda Castle, MD;  Location: [facility];  Service: Endoscopy;  Laterality: N/A;  . ESOPHAGOGASTRODUODENOSCOPY N/A 02/25/2014   Procedure: ESOPHAGOGASTRODUODENOSCOPY (EGD);  Surgeon: Inda Castle, MD;  Location: [facility];  Service: Endoscopy;  Laterality:  N/A;  . ESOPHAGOGASTRODUODENOSCOPY N/A 03/16/2017   Procedure: ESOPHAGOGASTRODUODENOSCOPY (EGD);  Surgeon: Mauri Pole, MD;  Location: [facility] ENDOSCOPY;  Service: Endoscopy;  Laterality: N/A;  . ESOPHAGOGASTRODUODENOSCOPY N/A 08/02/2018   Procedure: ESOPHAGOGASTRODUODENOSCOPY (EGD);  Surgeon: Mauri Pole, MD;  Location: [facility];  Service: Endoscopy;  Laterality: N/A;  . ESOPHAGOGASTRODUODENOSCOPY (EGD) WITH ESOPHAGEAL DILATION  06/27/2012   Procedure: ESOPHAGOGASTRODUODENOSCOPY (EGD) WITH ESOPHAGEAL DILATION;  Surgeon: Ladene Artist, MD,FACG;  Location: [facility];  Service: Endoscopy;  Laterality: N/A;  . ESOPHAGOGASTRODUODENOSCOPY (EGD) WITH PROPOFOL N/A 10/09/2015   Procedure: ESOPHAGOGASTRODUODENOSCOPY (EGD) WITH PROPOFOL ( WITH BOTOX);  Surgeon: Milus Banister, MD;  Location: [facility];  Service: Endoscopy;  Laterality: N/A;  . ESOPHAGOGASTRODUODENOSCOPY (EGD) WITH PROPOFOL N/A 12/03/2015   Procedure: ESOPHAGOGASTRODUODENOSCOPY (EGD) WITH PROPOFOL;  Surgeon: Mauri Pole, MD;  Location: [facility]OSCOPY;  Service: Endoscopy;  Laterality: N/A;  . FOREIGN BODY REMOVAL  08/02/2018   Procedure: FOREIGN BODY REMOVAL;  Surgeon: Mauri Pole, MD;  Location: [facility];  Service: Endoscopy;;  . PACEMAKER GENERATOR CHANGE  12/13/2007   at [facility]  . TOE SURGERY  2013   left 3rd toe HAMMERTOE REPAIR  .  TONSILLECTOMY  AS CHILD  . TOTAL HIP ARTHROPLASTY Right 2001  . VERICOSE VEIN LIGATION    . VIDEO ASSISTED THORACOSCOPY (VATS)/DECORTICATION  06/23/2012   Procedure: VIDEO ASSISTED THORACOSCOPY (VATS)/DECORTICATION;  Surgeon: Grace Isaac, MD;  Location: [facility];  Service: Thoracic;  Laterality: Right;  Marland Kitchen VIDEO ASSISTED THORACOSCOPY (VATS)/EMPYEMA     06/23/2012  . VIDEO BRONCHOSCOPY  06/23/2012   Procedure: VIDEO BRONCHOSCOPY;  Surgeon: Grace Isaac, MD;  Location[facility];  Service: Thoracic;  Laterality: N/A;     OB History   No obstetric history on file.      Family History  Problem Relation Age of Onset  . Arthritis Father   . Heart disease Father   . Cancer Brother        ?spine  . Heart disease Brother   . Lung cancer Brother   . Heart disease Mother        Congestive heart failure  . Heart disease Brother   . Arthritis Brother   . Hypertension Brother   . Colon cancer Neg Hx     Social History   Tobacco Use  . Smoking status: Passive Smoke Exposure - Never Smoker  . Smokeless tobacco: Never Used  . Tobacco comment: Exposure through father only.  Vaping Use  . Vaping Use: Never used  Substance Use Topics  . Alcohol use: Yes    Comment: occ  . Drug use: No    Home Medications Prior to Admission medications   Medication Sig Start Date End Date Taking? Authorizing Provider  acetaminophen (TYLENOL) 500 MG tablet Take 500 mg by mouth every 6 (six) hours as needed for mild pain or moderate pain.    [provider]  Calcium-Phosphorus-Vitamin D (CALCIUM/VITAMIN D3/ADULT GUMMY PO) Take 1 tablet by mouth See admin instructions. 500 mg calcium, 1000 iu vitamin D3, Phosphorous 230mg . 2 daily    [provider]  cholecalciferol (VITAMIN D3) 25 MCG (1000 UNIT) tablet Take 1,000 Units by mouth daily.    [provider]  Collagen-Boron-Hyaluronic Acid ([employer] JO[employer]ALTH TRIPLE ACTION PO) Take 1 tablet by mouth daily.    [provider]  denosumab (PROLIA) 60 MG/ML SOSY injection Inject 60 mg into the skin every 6 (six) months. On 1st Monday of every 6th mo. 12/06/19   [provider]  famotidine (PEPCID) 20 MG tablet Take 1 tablet (20 mg total) by mouth daily. 10/23/19   Geradine Girt, DO  Multiple Vitamins-Minerals (HM MULTIVITAMIN ADULT GUMMY PO) Take 3 tablets by mouth daily.     [provider]  Multiple Vitamins-Minerals (OCUVITE EYE + MULTI PO) Take 1 tablet by mouth daily.     [provider]  NON FORMULARY Iron plus Vitamin C tablet; 65mg /125mg ; amt: 65mg /125mg ;  oral Once A Day    [provider]  NON FORMULARY Heal and Soothe capsule; 300 mg; amt: 900 mg; oral Once A Day    [provider]  polyethylene glycol (MIRALAX / GLYCOLAX) 17 g packet Take 17 g by mouth daily.    [provider]  potassium chloride SA (KLOR-CON) 20 MEQ tablet Take 40 mEq by mouth daily. Dilute tablets in 13ml of water to dissolve    [provider]  predniSONE (DELTASONE) 10 MG tablet Take 10 mg by mouth daily with breakfast.    [provider]  torsemide (DEMADEX) 20 MG tablet Take 20 mg by mouth daily. 02/08/20   [provider]    Allergies  Actonel [risedronate sodium] and Ivp dye [iodinated diagnostic agents]  Review of Systems   Review of Systems  Constitutional: Negative for chills and fever.  HENT: Negative for sore throat.   Eyes: Negative for visual disturbance.  Respiratory: Negative for cough and shortness of breath.   Cardiovascular: Positive for chest pain.  Gastrointestinal: Negative for abdominal pain, diarrhea, nausea and vomiting.  Genitourinary: Negative for dysuria and frequency.  Musculoskeletal: Negative for back pain and neck pain.  Skin: Negative for rash.  Neurological: Negative for weakness, numbness and headaches.  Hematological: Negative for adenopathy.  Psychiatric/Behavioral: Negative for behavioral problems.    Physical Exam Updated Vital Signs There were no vitals taken for this visit.  Physical Exam Vitals and nursing note reviewed.  Constitutional:      General: She is not in acute distress.    Appearance: She is well-developed.  HENT:     Head: Normocephalic and atraumatic.     Mouth/Throat:     Pharynx: No oropharyngeal exudate.  Eyes:     General: No scleral icterus.       Right eye: No discharge.        Left eye: No discharge.     Conjunctiva/sclera: Conjunctivae normal.     Pupils: Pupils are equal, round, and reactive to light.  Neck:     Thyroid: No  thyromegaly.     Vascular: No JVD.  Cardiovascular:     Rate and Rhythm: Normal rate and regular rhythm.     Heart sounds: Murmur heard.  No friction rub. No gallop.   Pulmonary:     Effort: Pulmonary effort is normal. No respiratory distress.     Breath sounds: Normal breath sounds. No wheezing or rales.  Abdominal:     General: Bowel sounds are normal. There is no distension.     Palpations: Abdomen is soft. There is no mass.     Tenderness: There is no abdominal tenderness.  Musculoskeletal:        General: No tenderness. Normal range of motion.     Cervical back: Normal range of motion and neck supple.  Lymphadenopathy:     Cervical: No cervical adenopathy.  Skin:    General: Skin is warm and dry.     Findings: No erythema or rash.  Neurological:     Mental Status: She is alert.     Coordination: Coordination normal.  Psychiatric:        Behavior: Behavior normal.     ED Results / Procedures / Treatments   Labs (all labs ordered are listed, but only abnormal results are displayed) Labs Reviewed - No data to display  EKG None  Radiology No results found.  Procedures Procedures   Medications Ordered in ED Medications - No data to display  ED Course  I have reviewed the triage vital signs and the nursing notes.  Pertinent labs & imaging results that were available during my care of the patient were reviewed by me and considered in my medical decision making (see chart for details).    MDM Rules/Calculators/A&P                          This patient is well-appearing, has no acute findings at this time, EKG shows right bundle branch block, will get troponin and likely repeat troponin, patient has no history of coronary disease, otherwise appears stable and is symptom free  At change of shift the second troponin is pending, care  signed out to Dr. Darl Householder to disposition accordingly and follow-up results  Final Clinical Impression(s) / ED Diagnoses Final diagnoses:   None    Rx / DC Orders ED Discharge Orders    None       Noemi Chapel, MD 10/31/20 1658

## 2020-10-30 NOTE — ED Notes (Signed)
Son to take patient back to facilty.

## 2020-10-30 NOTE — Assessment & Plan Note (Signed)
CPAP/O2 at night

## 2020-10-30 NOTE — ED Triage Notes (Signed)
CP onset 2130 last night, still present this am. Mid sternal, "heartburn". Given pepcid and ASA 0630 and 324 ASA at 1000. Belching reported and patient stated she was pain free when medic arrived. ? Diaphoresis with CP. EKG BBB with PAC's. BP 128/58.

## 2020-10-30 NOTE — Assessment & Plan Note (Signed)
CHF, last hospitalization 10/18/19-4/12/21edema BLE, BNP 122, echocradiogram EF 32-99%, grade I diastolic dysfunction. CXR bilateral effusions, onTorsemide 20mg  qd.Bun/creat 25/0.7 10/04/20

## 2020-10-30 NOTE — Assessment & Plan Note (Signed)
plt 130 10/04/20

## 2020-10-30 NOTE — ED Provider Notes (Signed)
  Physical Exam  BP 116/64 (BP Location: Right Arm)   Pulse 84   Temp 98.2 F (36.8 C) (Oral)   Resp 19   SpO2 95%   Physical Exam  ED Course/Procedures     Procedures  MDM   Patient care assumed at 3 PM.  Patient has history of dilated esophagus here presenting with chest pain.  Appears to be atypical for ACS.  Signed out pending second troponin  3:38 PM Second troponin negative.  I think it is likely GI in origin.  Patient has GI follow-up already.  We will have her follow-up with cardiology if she has persistent pain      Drenda Freeze, MD 10/30/20 1538

## 2020-10-30 NOTE — Assessment & Plan Note (Signed)
Dysphagia diet/achalasia of esophagus, s/p dilation, on Pepcid, Pureed diet

## 2020-10-30 NOTE — Telephone Encounter (Signed)
Mast, Man X, NP  You 2 hours ago (12:49 PM)   Okay, I will call her between the clinic patients, thanks.    Message text

## 2020-10-30 NOTE — Telephone Encounter (Signed)
Ronnell Freshwater, daughter, called and stated that patient was sent to the hospital today and wants to speak with the provider regarding this.   Wants you to call [phone]

## 2020-10-30 NOTE — Assessment & Plan Note (Signed)
Hx of PMR on Prednisone 33m qd since 10/10/20, saw rheumatology 02/26/20. ESR 36, CRP 15.4 08/07/20

## 2020-10-30 NOTE — Assessment & Plan Note (Signed)
Constipation, takes MiraLax qd, last BM this morning.

## 2020-10-30 NOTE — Assessment & Plan Note (Signed)
Treated with Prolia.

## 2020-10-30 NOTE — Assessment & Plan Note (Signed)
Hypokalemia replete, on Kcl 67meq qd, K 4.2 10/04/20

## 2020-10-30 NOTE — Discharge Instructions (Addendum)
Your testing shows no signs of heart attack You will need to follow up with the heart doctor in the next 3 days for recheck - see phone number above if you do not have a heart doctor.  Please also follow-up with your GI doctor. ER for any recurrent chest pain or worsening symptoms Continue to take a baby aspirin daily until you talk to the cardiologist.
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
## 2020-10-31 ENCOUNTER — Encounter: Payer: Self-pay | Admitting: Nurse Practitioner

## 2020-10-31 ENCOUNTER — Non-Acute Institutional Stay: Payer: Medicare Other | Admitting: Nurse Practitioner

## 2020-10-31 DIAGNOSIS — G4733 Obstructive sleep apnea (adult) (pediatric): Secondary | ICD-10-CM

## 2020-10-31 DIAGNOSIS — I5032 Chronic diastolic (congestive) heart failure: Secondary | ICD-10-CM | POA: Diagnosis not present

## 2020-10-31 DIAGNOSIS — K220 Achalasia of cardia: Secondary | ICD-10-CM | POA: Diagnosis not present

## 2020-10-31 DIAGNOSIS — M810 Age-related osteoporosis without current pathological fracture: Secondary | ICD-10-CM

## 2020-10-31 DIAGNOSIS — R441 Visual hallucinations: Secondary | ICD-10-CM | POA: Diagnosis not present

## 2020-10-31 DIAGNOSIS — D696 Thrombocytopenia, unspecified: Secondary | ICD-10-CM | POA: Diagnosis not present

## 2020-10-31 DIAGNOSIS — K5901 Slow transit constipation: Secondary | ICD-10-CM | POA: Diagnosis not present

## 2020-10-31 DIAGNOSIS — R079 Chest pain, unspecified: Secondary | ICD-10-CM | POA: Diagnosis not present

## 2020-10-31 DIAGNOSIS — M353 Polymyalgia rheumatica: Secondary | ICD-10-CM | POA: Diagnosis not present

## 2020-10-31 DIAGNOSIS — E876 Hypokalemia: Secondary | ICD-10-CM | POA: Diagnosis not present
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
## 2020-10-31 NOTE — Assessment & Plan Note (Signed)
Sleep apnea, CPAP/O2 at night  

## 2020-10-31 NOTE — Assessment & Plan Note (Signed)
Thrombocytopenia, plt 124 10/30/20
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
## 2020-10-31 NOTE — Assessment & Plan Note (Signed)
Takes Prolia.  

## 2020-10-31 NOTE — Assessment & Plan Note (Signed)
ED eval 10/30/20 for mid sternal chest pain, CBC/BMP/Troponin x2, CXR, EKG unremarkable except mega esophagus, f/u Cardiology 2 days. It was deemed to be GI etiology of the chest pain, resolved today.

## 2020-10-31 NOTE — Assessment & Plan Note (Signed)
HF, last hospitalization 10/18/19-4/12/21edema BLE, BNP 122, echocradiogram EF 08-14%, grade I diastolic dysfunction. CXR bilateral effusions, onTorsemide 20mg  qd.Bun/creat18/0.74 10/30/20

## 2020-10-31 NOTE — Assessment & Plan Note (Signed)
Dysphagia diet/achalasia of esophagus, s/p dilation, on Pepcid, Pureed diet  °

## 2020-10-31 NOTE — Assessment & Plan Note (Signed)
Hypokalemia replete, on Kcl 87meq qd, K4.0 10/30/20

## 2020-10-31 NOTE — Assessment & Plan Note (Signed)
Hx of PMR on Prednisone 9mg qd since 10/10/20, saw rheumatology 02/26/20. ESR 36, CRP 15.4 08/07/20 

## 2020-10-31 NOTE — Assessment & Plan Note (Signed)
Stable, continue MiraLax.  °

## 2020-11-03 ENCOUNTER — Encounter: Payer: Self-pay | Admitting: Nurse Practitioner

## 2020-11-04 ENCOUNTER — Ambulatory Visit (INDEPENDENT_AMBULATORY_CARE_PROVIDER_SITE_OTHER): Payer: Medicare Other | Admitting: Student

## 2020-11-04 ENCOUNTER — Other Ambulatory Visit: Payer: Self-pay

## 2020-11-04 ENCOUNTER — Encounter: Payer: Self-pay | Admitting: Student

## 2020-11-04 VITALS — BP 120/70 | HR 80 | Ht 60.0 in | Wt 150.2 lb

## 2020-11-04 DIAGNOSIS — K31819 Angiodysplasia of stomach and duodenum without bleeding: Secondary | ICD-10-CM | POA: Diagnosis not present

## 2020-11-04 DIAGNOSIS — I10 Essential (primary) hypertension: Secondary | ICD-10-CM | POA: Diagnosis not present

## 2020-11-04 DIAGNOSIS — I495 Sick sinus syndrome: Secondary | ICD-10-CM

## 2020-11-04 LAB — CUP PACEART INCLINIC DEVICE CHECK
Battery Impedance: 6046 Ohm
Battery Remaining Longevity: 3 mo
Battery Voltage: 2.65 V
Brady Statistic AP VP Percent: 2 %
Brady Statistic AP VS Percent: 13 %
Brady Statistic AS VP Percent: 0 %
Brady Statistic AS VS Percent: 84 %
Brady Statistic RA Percent Paced: 15 %
Brady Statistic RV Percent Paced: 2 %
Date Time Interrogation Session: 20220426100710
Implantable Lead Implant Date: 20001227
Implantable Lead Implant Date: 20001227
Implantable Lead Location: 753859
Implantable Lead Location: 753860
Implantable Lead Model: 5076
Implantable Lead Model: 5076
Implantable Pulse Generator Implant Date: 20090603
Lead Channel Impedance Value: 479 Ohm
Lead Channel Impedance Value: 762 Ohm
Lead Channel Pacing Threshold Amplitude: 0.75 V
Lead Channel Pacing Threshold Amplitude: 0.75 V
Lead Channel Pacing Threshold Pulse Width: 0.4 ms
Lead Channel Pacing Threshold Pulse Width: 0.4 ms
Lead Channel Sensing Intrinsic Amplitude: 11.2 mV
Lead Channel Sensing Intrinsic Amplitude: 2 mV
Lead Channel Setting Pacing Amplitude: 2 V
Lead Channel Setting Pacing Amplitude: 2.5 V
Lead Channel Setting Pacing Pulse Width: 0.4 ms
Lead Channel Setting Sensing Sensitivity: 4 mV

## 2020-11-04 NOTE — Progress Notes (Signed)
Electrophysiology Office Note Date: 11/04/2020  ID:  [name], DOB [birth year], MRN [id number]  PCP: [name], MD Primary Cardiologist: No primary care provider on file. Electrophysiologist: Jill Axe, MD   CC: Pacemaker follow-up  [name] is a 87 y.o. female seen today for Jill Axe, MD for routine electrophysiology followup.  Since last being seen in our clinic the patient reports doing OK. She has intermittent issues with achalasia resulting in mega esophagus.  she denies chest pain, palpitations, dyspnea, PND, orthopnea, nausea, vomiting, dizziness, syncope, edema, weight gain, or early satiety.  Device History: MDT dual chamber PPM implanted 2000 for intermittent complete heart block; gen change 2009  Past Medical History:  Diagnosis Date  . Achalasia   . Anemia   . Arthritis    back  . Chronic steroid use   . Complete heart block ([place])    S/P PACEMAKER 2000 W/ GENERATOR CHANGE 2009  . Empyema, right ([place]) PULMOLOGIST-- DR CLANCE   VATS 06/23/2012 cultures negative to date CXR 07/19/12 persistent airfluid levels/  CXR 11-01-2012 IMPROVE RIGHT PLEURAL EFFUSION  . GERD (gastroesophageal reflux disease)   . History of aspiration pneumonitis    DEC 2013  . History of hiatal hernia   . Hypertension   . Inguinal hernia    right  . Intrinsic urethral sphincter deficiency   . Megaesophagus   . Mixed stress and urge urinary incontinence   . Multinodular thyroid 06/26/2012   Multi nodular goiter. Large nodules in both lobes of the gland.  These nodules fit national criteria for fine needle aspiration  biopsy if not previously assessed.    . OSA on CPAP    cpap, doees not know settings  . Polymyalgia rheumatica (HCC)    on chronic Prednisone 64m daily  . RBBB   . S/P dilatation of esophageal stricture    Past Surgical History:  Procedure Laterality Date  . APPENDECTOMY  1953   w/ removal benign kidney tumor   . BALLOON DILATION  07/24/2012    Procedure: BALLOON DILATION;  Surgeon: RInda Castle MD;  [facility];  Service: Endoscopy;  Laterality: N/A;  . BALLOON DILATION N/A 12/03/2015   Procedure: BALLOON DILATION;  Surgeon: KMauri Pole MD;  [facility];  Service: Endoscopy;  Laterality: N/A;  pnuematic balloon  . BALLOON DILATION N/A 03/16/2017   Procedure: BALLOON DILATION;  Surgeon: NMauri Pole MD;  [facility];  Service: Endoscopy;  Laterality: N/A;  PNUEMATIC BALLOONS  . BOTOX INJECTION  08/07/2012   Procedure: BOTOX INJECTION;  Surgeon: RInda Castle MD;  [facility];  Service: Endoscopy;  Laterality: N/A;  . BOTOX INJECTION N/A 05/24/2013   Procedure: MACROPLASTIQUE IMPLANT;  Surgeon: JIrine Seal MD;  [facility]  Service: Urology;  Laterality: N/A;  . BOTOX INJECTION  02/25/2014   Procedure: BOTOX INJECTION;  Surgeon: RInda Castle MD;  [facility];  Service: Endoscopy;;  . CARDIAC PACEMAKER PLACEMENT  06/1999  [facility] [name] ( LAST PACER CHECK 05-09-2013) for CHB/   END-OF-LIFE GENERATOR CHANGE  2009  . CATARACT EXTRACTION W/ INTRAOCULAR LENS  IMPLANT, BILATERAL  2005  . DILATION AND CURETTAGE OF UTERUS    . ESOPHAGOGASTRODUODENOSCOPY  07/24/2012   Procedure: ESOPHAGOGASTRODUODENOSCOPY (EGD);  Surgeon: RInda Castle MD;  [facility];  Service: Endoscopy;  Laterality: N/A;  . ESOPHAGOGASTRODUODENOSCOPY  08/07/2012   Procedure: ESOPHAGOGASTRODUODENOSCOPY (EGD);  Surgeon: Jill Castle, MD;  [facility];  Service: Endoscopy;  Laterality: N/A;  . ESOPHAGOGASTRODUODENOSCOPY N/A 02/25/2014   Procedure: ESOPHAGOGASTRODUODENOSCOPY (EGD);  Surgeon: Jill Castle, MD;  [facility];  Service: Endoscopy;  Laterality: N/A;  . ESOPHAGOGASTRODUODENOSCOPY N/A 03/16/2017   Procedure: ESOPHAGOGASTRODUODENOSCOPY (EGD);  Surgeon: Mauri Pole, MD;  [facility];   Service: Endoscopy;  Laterality: N/A;  . ESOPHAGOGASTRODUODENOSCOPY N/A 08/02/2018   Procedure: ESOPHAGOGASTRODUODENOSCOPY (EGD);  Surgeon: Mauri Pole, MD;  [facility];  Service: Endoscopy;  Laterality: N/A;  . ESOPHAGOGASTRODUODENOSCOPY (EGD) WITH ESOPHAGEAL DILATION  06/27/2012   Procedure: ESOPHAGOGASTRODUODENOSCOPY (EGD) WITH ESOPHAGEAL DILATION;  Surgeon: Jill Artist, MD,FACG;  Location: [facility];  Service: Endoscopy;  Laterality: N/A;  . ESOPHAGOGASTRODUODENOSCOPY (EGD) WITH PROPOFOL N/A 10/09/2015   Procedure: ESOPHAGOGASTRODUODENOSCOPY (EGD) WITH PROPOFOL ( WITH BOTOX);  Surgeon: Milus Banister, MD;  [facility];  Service: Endoscopy;  Laterality: N/A;  . ESOPHAGOGASTRODUODENOSCOPY (EGD) WITH PROPOFOL N/A 12/03/2015   Procedure: ESOPHAGOGASTRODUODENOSCOPY (EGD) WITH PROPOFOL;  Surgeon: Mauri Pole, MD;  Location: [facility];  Service: Endoscopy;  Laterality: N/A;  . FOREIGN BODY REMOVAL  08/02/2018   Procedure: FOREIGN BODY REMOVAL;  Surgeon: Mauri Pole, MD;  [facility];  Service: Endoscopy;;  . PACEMAKER GENERATOR CHANGE  12/13/2007   at [facility]  . TOE SURGERY  2013   left 3rd toe HAMMERTOE REPAIR  . TONSILLECTOMY  AS CHILD  . TOTAL HIP ARTHROPLASTY Right 2001  . VERICOSE VEIN LIGATION    . VIDEO ASSISTED THORACOSCOPY (VATS)/DECORTICATION  06/23/2012   Procedure: VIDEO ASSISTED THORACOSCOPY (VATS)/DECORTICATION;  Surgeon: Jill Isaac, MD;  Location: [facility];  Service: Thoracic;  Laterality: Right;  Marland Kitchen VIDEO ASSISTED THORACOSCOPY (VATS)/EMPYEMA     06/23/2012  . VIDEO BRONCHOSCOPY  06/23/2012   Procedure: VIDEO BRONCHOSCOPY;  Surgeon: Jill Isaac, MD;  Location: [facility];  Service: Thoracic;  Laterality: N/A;    Current Outpatient Medications  Medication Sig Dispense Refill  . acetaminophen (TYLENOL) 500 MG tablet Take 500 mg by mouth every 6 (six) hours as needed for mild pain or moderate pain.    .  Calcium-Phosphorus-Vitamin D (CALCIUM/VITAMIN D3/ADULT GUMMY PO) Take 1 tablet by mouth See admin instructions. 500 mg calcium, 1000 iu vitamin D3, Phosphorous 236m. 2 daily    . cholecalciferol (VITAMIN D3) 25 MCG (1000 UNIT) tablet Take 1,000 Units by mouth daily.    . Collagen-Boron-Hyaluronic Acid ([employer] JOINT HEALTH TRIPLE ACTION PO) Take 1 tablet by mouth daily.    . Collagen-Boron-Hyaluronic Acid ([employer] JOINT HEALTH TRIPLE ACTION) 40-5-3.3 MG TABS Take 1 tablet by mouth daily.    .Marland Kitchendenosumab (PROLIA) 60 MG/ML SOSY injection Inject 60 mg into the skin every 6 (six) months. On 1st Monday of every 6th mo.    . famotidine (PEPCID) 20 MG tablet Take 1 tablet (20 mg total) by mouth daily.    . Multiple Vitamins-Minerals (HM MULTIVITAMIN ADULT GUMMY PO) Take 3 tablets by mouth daily.     . Multiple Vitamins-Minerals (OCUVITE EYE + MULTI PO) Take 1 tablet by mouth daily.     . NON FORMULARY Iron plus Vitamin C tablet; 694m125mg; amt: 6552m25mg; oral Once A Day    . NON FORMULARY Heal and Soothe capsule; 300 mg; amt: 900 mg; oral Once A Day    . polyethylene glycol (MIRALAX / GLYCOLAX) 17 g packet Take 17 g by mouth daily.    . potassium chloride SA (KLOR-CON) 20 MEQ tablet Take  40 mEq by mouth daily. Dilute tablets in 37m of water to dissolve    . PREDNISONE PO Take 9 mg by mouth daily.    .Marland Kitchentorsemide (DEMADEX) 20 MG tablet Take 20 mg by mouth daily.     No current facility-administered medications for this visit.    Allergies:   Actonel [risedronate sodium] and Ivp dye [iodinated diagnostic agents]   Social History: Social History   Socioeconomic History  . Marital status: Married    Spouse name: Not on file  . Number of children: 927 . Years of education: Not on file  . Highest education level: Not on file  Occupational History  . Occupation: Retired ASurveyor, quantity Tobacco Use  . Smoking status: Passive Smoke Exposure - Never Smoker  . Smokeless tobacco: Never Used  .  Tobacco comment: Exposure through father only.  Vaping Use  . Vaping Use: Never used  Substance and Sexual Activity  . Alcohol use: Yes    Comment: occ  . Drug use: No  . Sexual activity: Never    Comment: married  Other Topics Concern  . Not on file  Social History Narrative   [facility]   Married   Pacemaker   POA   Never smoked   Alcohol-wine 2-3 nights a week    Exercise - exercise classes 3 days a week    [facility]      [facility]:   [place] Previously worked doing aWeb designerwork. No pets currently. No bird exposure.    Social Determinants of Health   Financial Resource Strain: Not on file  Food Insecurity: Not on file  Transportation Needs: Not on file  Physical Activity: Not on file  Stress: Not on file  Social Connections: Not on file  Intimate Partner Violence: Not on file    Family History: Family History  Problem Relation Age of Onset  . Arthritis Father   . Heart disease Father   . Cancer Brother        ?spine  . Heart disease Brother   . Lung cancer Brother   . Heart disease Mother        Congestive heart failure  . Heart disease Brother   . Arthritis Brother   . Hypertension Brother   . Colon cancer Neg Hx      Review of Systems: All other systems reviewed and are otherwise negative except as noted above.  Physical Exam: Vitals:   11/04/20 0939  BP: 120/70  Pulse: 80  SpO2: 96%  Weight: 150 lb 3.2 oz (68.1 kg)  Height: 5' (1.524 m)     GEN- The patient is well appearing, alert and oriented x 3 today.   HEENT: normocephalic, atraumatic; sclera clear, conjunctiva pink; hearing intact; oropharynx clear; neck supple  Lungs- Clear to ausculation bilaterally, normal work of breathing.  No wheezes, rales, rhonchi Heart- Regular rate and rhythm, no murmurs, rubs or gallops  GI-  soft, non-tender, non-distended, bowel sounds present  Extremities- no clubbing or cyanosis. No edema MS- no significant deformity or atrophy Skin- warm and dry, no rash or lesion; PPM pocket well healed Psych- euthymic mood, full affect Neuro- strength and sensation are intact  PPM Interrogation- reviewed in detail today,  See PACEART report  EKG:  EKG is not ordered today. EKG from 10/30/20 shows NSR  at 93 bpm, RBBB and LPFB at 144 ms.  Recent Labs: 08/05/2020: TSH 0.71 10/04/2020: ALT 9 10/30/2020: BUN 18; Creatinine, Ser 0.74; Hemoglobin 13.6; Platelets 124; Potassium 4.0; Sodium 142   Wt Readings from Last 3 Encounters:  11/04/20 150 lb 3.2 oz (68.1 kg)  10/31/20 154 lb 6.4 oz (70 kg)  10/16/20 154 lb 6.4 oz (70 kg)     Other studies Reviewed: Additional studies/ records that were reviewed today include: Previous EP office notes, Previous remote checks, Most recent labwork.   Assessment and Plan:  1. Symptomatic bradycardia s/p Medtronic PPM  Normal PPM function See Claudia Desanctis Art report No changes today Her last remote was 08/12/2019. As below, not comfortable with manual transmissions. Will be able to follow more closely post gen change (estimated 3 months to ERI) Echo 10/18/2019 LVEF 55-60%  2.  Paroxysmal atrial fibrillation Burden by device interrogation <1% She had 2 episodes in the 3-6 hr range. She was unaware of these and does not recall any specific issues around those times. By chart review, these do appear to correlate with an admission in 11/5206 for diastolic CHF exacerbation and treatment for a UTI in 09/2020. CHADS2VASC is at least 5 We discussed at length today. She has not had a stroke nor does she have history of bleeding. We will continue watchful waiting for Subclinical Atrial Fibrillation at this time. We will be able to follow even closer once she gets a gen change and has a wireless capable device for remotes. She does not feel confident in her ability to manage  manual transmissions and is in an assisted living facility.   3.  HTN Stable on current medications.   4. Atypical chest pain Consistent with her history of dilated esophagus due to achalasia She is pending GI follow up.  Symptoms have resolved.   Current medicines are reviewed at length with the patient today.   The patient does not have concerns regarding her medicines.  The following changes were made today:  none  Labs/ tests ordered today include:  Labs from recent ER encounter reviewed.   Disposition:   Follow up with Dr. Caryl Comes in 3 Months to discuss gen change and timing, it's quite possible she will have met ERI by that time.    Jacalyn Lefevre, PA-C  11/04/2020 10:48 AM  [facility] [address] [place] [address] [phone] (office) [phone] (fax)

## 2020-11-04 NOTE — Patient Instructions (Signed)
Medication Instructions:  Your physician recommends that you continue on your current medications as directed. Please refer to the Current Medication list given to you today.  *If you need a refill on your cardiac medications before your next appointment, please call your pharmacy*   Lab Work: None If you have labs (blood work) drawn today and your tests are completely normal, you will receive your results only by: Marland Kitchen MyChart Message (if you have MyChart) OR . A paper copy in the mail If you have any lab test that is abnormal or we need to change your treatment, we will call you to review the results.  Follow-Up: At [facility], you and your health needs are our priority.  As part of our continuing mission to provide you with exceptional heart care, we have created designated Provider Care Teams.  These Care Teams include your primary Cardiologist (physician) and Advanced Practice Providers (APPs -  Physician Assistants and Nurse Practitioners) who all work together to provide you with the care you need, when you need it.  Your next appointment:   As scheduled with Dr Caryl Comes

## 2020-11-12 DIAGNOSIS — U071 COVID-19: Secondary | ICD-10-CM | POA: Insufficient documentation

## 2020-12-09 DIAGNOSIS — Z23 Encounter for immunization: Secondary | ICD-10-CM | POA: Diagnosis not present

## 2020-12-15 ENCOUNTER — Other Ambulatory Visit: Payer: Self-pay

## 2020-12-15 NOTE — Telephone Encounter (Signed)
Almyra Free w/ [facility] in [place] called and requested a refill request for patient's Prednisone 1mg  and 5 mg. She reports that patient brought in the medication bottles without any additional refills. I advised her that the request will have to be send to a provider due to medication not showing active in patients chart.  Please advise.

## 2020-12-16 ENCOUNTER — Telehealth: Payer: Self-pay | Admitting: *Deleted

## 2020-12-16 MED ORDER — PREDNISONE 1 MG PO TABS
1.0000 mg | ORAL_TABLET | Freq: Every day | ORAL | 0 refills | Status: DC
Start: 2020-12-16 — End: 2020-12-19

## 2020-12-16 MED ORDER — PREDNISONE 5 MG PO TABS: 5.0000 mg | ORAL_TABLET | Freq: Every day | ORAL | 0 refills | Status: DC

## 2020-12-16 NOTE — Telephone Encounter (Signed)
Patient is currently visiting daughter in [place] and the facility sent medications but did not send enough of patient's Prednisone.  Daughter is needing 4 days worth of Prednisone 9mg .  [place] [facility] [place]  Please Advise.

## 2020-12-16 NOTE — Telephone Encounter (Signed)
Mast, Man X, NP  You 24 minutes ago (11:01 AM)   Pend the refill to me, thanks.    Message text       Pended Rx and sent to [facility].

## 2020-12-19 ENCOUNTER — Non-Acute Institutional Stay: Payer: Medicare Other | Admitting: Nurse Practitioner

## 2020-12-19 ENCOUNTER — Encounter: Payer: Self-pay | Admitting: Nurse Practitioner

## 2020-12-19 DIAGNOSIS — R441 Visual hallucinations: Secondary | ICD-10-CM

## 2020-12-19 DIAGNOSIS — M949 Disorder of cartilage, unspecified: Secondary | ICD-10-CM

## 2020-12-19 DIAGNOSIS — E876 Hypokalemia: Secondary | ICD-10-CM

## 2020-12-19 DIAGNOSIS — I5032 Chronic diastolic (congestive) heart failure: Secondary | ICD-10-CM | POA: Diagnosis not present

## 2020-12-19 DIAGNOSIS — D696 Thrombocytopenia, unspecified: Secondary | ICD-10-CM

## 2020-12-19 DIAGNOSIS — L853 Xerosis cutis: Secondary | ICD-10-CM | POA: Insufficient documentation

## 2020-12-19 DIAGNOSIS — M899 Disorder of bone, unspecified: Secondary | ICD-10-CM

## 2020-12-19 DIAGNOSIS — K5901 Slow transit constipation: Secondary | ICD-10-CM

## 2020-12-19 DIAGNOSIS — G4733 Obstructive sleep apnea (adult) (pediatric): Secondary | ICD-10-CM | POA: Diagnosis not present

## 2020-12-19 DIAGNOSIS — K220 Achalasia of cardia: Secondary | ICD-10-CM

## 2020-12-19 DIAGNOSIS — M353 Polymyalgia rheumatica: Secondary | ICD-10-CM

## 2020-12-19 NOTE — Assessment & Plan Note (Signed)
Stable, continue MiraLax.  °
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
## 2020-12-19 NOTE — Assessment & Plan Note (Signed)
takes Prolia 

## 2020-12-19 NOTE — Assessment & Plan Note (Signed)
Encourage skin moisturizer use, observe.

## 2020-12-19 NOTE — Assessment & Plan Note (Signed)
lt 124 10/30/20

## 2020-12-19 NOTE — Assessment & Plan Note (Signed)
minimal edema BLE, BNP 122 in the past, echocardiogram  EF 01-22%, grade I diastolic dysfunction.  CXR bilateral effusions, on Torsemide 20mg  qd. Bun/creat 18/0.74 10/30/20. F/u cardiology

## 2020-12-19 NOTE — Assessment & Plan Note (Addendum)
CPAP/O2 at night, the patient has been beneficial from using CPAP for over 20 years, continue CPAP at night.

## 2020-12-19 NOTE — Assessment & Plan Note (Signed)
on Prednisone 44m qd since 10/10/20, saw rheumatology 02/26/20. ESR 36, CRP 15.4 08/07/20
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
## 2020-12-19 NOTE — Assessment & Plan Note (Signed)
on Kcl 64meq qd, K 4.0 10/30/20

## 2020-12-19 NOTE — Assessment & Plan Note (Signed)
Dysphagia diet/achalasia of esophagus, s/p dilation, on Pepcid, Pureed diet

## 2020-12-22 DIAGNOSIS — R1310 Dysphagia, unspecified: Secondary | ICD-10-CM | POA: Diagnosis not present

## 2020-12-22 DIAGNOSIS — N3946 Mixed incontinence: Secondary | ICD-10-CM | POA: Diagnosis not present

## 2020-12-22 DIAGNOSIS — M6281 Muscle weakness (generalized): Secondary | ICD-10-CM | POA: Diagnosis not present

## 2020-12-22 DIAGNOSIS — R262 Difficulty in walking, not elsewhere classified: Secondary | ICD-10-CM | POA: Diagnosis not present

## 2020-12-22 DIAGNOSIS — R2681 Unsteadiness on feet: Secondary | ICD-10-CM | POA: Diagnosis not present

## 2020-12-22 DIAGNOSIS — M353 Polymyalgia rheumatica: Secondary | ICD-10-CM | POA: Diagnosis not present

## 2020-12-22 DIAGNOSIS — R531 Weakness: Secondary | ICD-10-CM | POA: Diagnosis not present

## 2020-12-22 DIAGNOSIS — R1314 Dysphagia, pharyngoesophageal phase: Secondary | ICD-10-CM | POA: Diagnosis not present

## 2020-12-22 DIAGNOSIS — Z8616 Personal history of COVID-19: Secondary | ICD-10-CM | POA: Diagnosis not present

## 2020-12-23 ENCOUNTER — Telehealth: Payer: Self-pay | Admitting: *Deleted

## 2020-12-23 DIAGNOSIS — R2681 Unsteadiness on feet: Secondary | ICD-10-CM | POA: Diagnosis not present

## 2020-12-23 DIAGNOSIS — B351 Tinea unguium: Secondary | ICD-10-CM | POA: Diagnosis not present

## 2020-12-23 DIAGNOSIS — L84 Corns and callosities: Secondary | ICD-10-CM | POA: Diagnosis not present

## 2020-12-23 DIAGNOSIS — R531 Weakness: Secondary | ICD-10-CM | POA: Diagnosis not present

## 2020-12-23 DIAGNOSIS — R1314 Dysphagia, pharyngoesophageal phase: Secondary | ICD-10-CM | POA: Diagnosis not present

## 2020-12-23 DIAGNOSIS — R262 Difficulty in walking, not elsewhere classified: Secondary | ICD-10-CM | POA: Diagnosis not present

## 2020-12-23 DIAGNOSIS — G4733 Obstructive sleep apnea (adult) (pediatric): Secondary | ICD-10-CM

## 2020-12-23 DIAGNOSIS — R1310 Dysphagia, unspecified: Secondary | ICD-10-CM | POA: Diagnosis not present

## 2020-12-23 DIAGNOSIS — M79671 Pain in right foot: Secondary | ICD-10-CM | POA: Diagnosis not present

## 2020-12-23 DIAGNOSIS — M79672 Pain in left foot: Secondary | ICD-10-CM | POA: Diagnosis not present

## 2020-12-23 DIAGNOSIS — M6281 Muscle weakness (generalized): Secondary | ICD-10-CM | POA: Diagnosis not present

## 2020-12-23 NOTE — Telephone Encounter (Signed)
Daughter, Magdalen Spatz, called and stated that patient's CPAP Machine is broken.   She called [facility] [phone] on [address] where the machine is from and they told her that the machine is so old that they need a new Rx for a new one.   Daughter is requesting a new order to be sent to [facility] for a CPAP machine.   Please Advise.

## 2020-12-24 NOTE — Telephone Encounter (Signed)
Order faxed to [facility]: [phone]

## 2020-12-24 NOTE — Telephone Encounter (Signed)
I Pended order yesterday for approval.

## 2020-12-24 NOTE — Telephone Encounter (Signed)
Daughter is calling regarding the status of the CPAP.

## 2020-12-24 NOTE — Telephone Encounter (Signed)
It is still Pending on this encounter. It has 1 Unsigned order at bottom right

## 2020-12-26 DIAGNOSIS — R2681 Unsteadiness on feet: Secondary | ICD-10-CM | POA: Diagnosis not present

## 2020-12-26 DIAGNOSIS — R262 Difficulty in walking, not elsewhere classified: Secondary | ICD-10-CM | POA: Diagnosis not present

## 2020-12-26 DIAGNOSIS — M6281 Muscle weakness (generalized): Secondary | ICD-10-CM | POA: Diagnosis not present

## 2020-12-26 DIAGNOSIS — R531 Weakness: Secondary | ICD-10-CM | POA: Diagnosis not present

## 2020-12-26 DIAGNOSIS — R1314 Dysphagia, pharyngoesophageal phase: Secondary | ICD-10-CM | POA: Diagnosis not present

## 2020-12-26 DIAGNOSIS — R1310 Dysphagia, unspecified: Secondary | ICD-10-CM | POA: Diagnosis not present

## 2020-12-29 DIAGNOSIS — M6281 Muscle weakness (generalized): Secondary | ICD-10-CM | POA: Diagnosis not present

## 2020-12-29 DIAGNOSIS — R262 Difficulty in walking, not elsewhere classified: Secondary | ICD-10-CM | POA: Diagnosis not present

## 2020-12-29 DIAGNOSIS — R1314 Dysphagia, pharyngoesophageal phase: Secondary | ICD-10-CM | POA: Diagnosis not present

## 2020-12-29 DIAGNOSIS — R531 Weakness: Secondary | ICD-10-CM | POA: Diagnosis not present

## 2020-12-29 DIAGNOSIS — R2681 Unsteadiness on feet: Secondary | ICD-10-CM | POA: Diagnosis not present

## 2020-12-29 DIAGNOSIS — R1310 Dysphagia, unspecified: Secondary | ICD-10-CM | POA: Diagnosis not present

## 2020-12-31 ENCOUNTER — Telehealth: Payer: Self-pay

## 2020-12-31 DIAGNOSIS — R1310 Dysphagia, unspecified: Secondary | ICD-10-CM | POA: Diagnosis not present

## 2020-12-31 DIAGNOSIS — R262 Difficulty in walking, not elsewhere classified: Secondary | ICD-10-CM | POA: Diagnosis not present

## 2020-12-31 DIAGNOSIS — R531 Weakness: Secondary | ICD-10-CM | POA: Diagnosis not present

## 2020-12-31 DIAGNOSIS — M6281 Muscle weakness (generalized): Secondary | ICD-10-CM | POA: Diagnosis not present

## 2020-12-31 DIAGNOSIS — R1314 Dysphagia, pharyngoesophageal phase: Secondary | ICD-10-CM | POA: Diagnosis not present

## 2020-12-31 DIAGNOSIS — R2681 Unsteadiness on feet: Secondary | ICD-10-CM | POA: Diagnosis not present

## 2020-12-31 NOTE — Telephone Encounter (Signed)
Melissa from [facility] called stating that the insurance needs a pressure range for the c-pap machine to be covered. Recommended basic range per Lenna Sciara is 4-20. F) [phone] P) [phone] Message routed to Man X Mast, NP

## 2021-01-01 DIAGNOSIS — R1310 Dysphagia, unspecified: Secondary | ICD-10-CM | POA: Diagnosis not present

## 2021-01-01 DIAGNOSIS — R2681 Unsteadiness on feet: Secondary | ICD-10-CM | POA: Diagnosis not present

## 2021-01-01 DIAGNOSIS — R1314 Dysphagia, pharyngoesophageal phase: Secondary | ICD-10-CM | POA: Diagnosis not present

## 2021-01-01 DIAGNOSIS — R262 Difficulty in walking, not elsewhere classified: Secondary | ICD-10-CM | POA: Diagnosis not present

## 2021-01-01 DIAGNOSIS — R531 Weakness: Secondary | ICD-10-CM | POA: Diagnosis not present

## 2021-01-01 DIAGNOSIS — M6281 Muscle weakness (generalized): Secondary | ICD-10-CM | POA: Diagnosis not present

## 2021-01-02 DIAGNOSIS — R262 Difficulty in walking, not elsewhere classified: Secondary | ICD-10-CM | POA: Diagnosis not present

## 2021-01-02 DIAGNOSIS — R1310 Dysphagia, unspecified: Secondary | ICD-10-CM | POA: Diagnosis not present

## 2021-01-02 DIAGNOSIS — R2681 Unsteadiness on feet: Secondary | ICD-10-CM | POA: Diagnosis not present

## 2021-01-02 DIAGNOSIS — R1314 Dysphagia, pharyngoesophageal phase: Secondary | ICD-10-CM | POA: Diagnosis not present

## 2021-01-02 DIAGNOSIS — R531 Weakness: Secondary | ICD-10-CM | POA: Diagnosis not present

## 2021-01-02 DIAGNOSIS — M6281 Muscle weakness (generalized): Secondary | ICD-10-CM | POA: Diagnosis not present

## 2021-01-05 DIAGNOSIS — R1314 Dysphagia, pharyngoesophageal phase: Secondary | ICD-10-CM | POA: Diagnosis not present

## 2021-01-05 DIAGNOSIS — M6281 Muscle weakness (generalized): Secondary | ICD-10-CM | POA: Diagnosis not present

## 2021-01-05 DIAGNOSIS — R531 Weakness: Secondary | ICD-10-CM | POA: Diagnosis not present

## 2021-01-05 DIAGNOSIS — R1310 Dysphagia, unspecified: Secondary | ICD-10-CM | POA: Diagnosis not present

## 2021-01-05 DIAGNOSIS — R262 Difficulty in walking, not elsewhere classified: Secondary | ICD-10-CM | POA: Diagnosis not present

## 2021-01-05 DIAGNOSIS — R2681 Unsteadiness on feet: Secondary | ICD-10-CM | POA: Diagnosis not present

## 2021-01-06 DIAGNOSIS — R262 Difficulty in walking, not elsewhere classified: Secondary | ICD-10-CM | POA: Diagnosis not present

## 2021-01-06 DIAGNOSIS — R531 Weakness: Secondary | ICD-10-CM | POA: Diagnosis not present

## 2021-01-06 DIAGNOSIS — R1310 Dysphagia, unspecified: Secondary | ICD-10-CM | POA: Diagnosis not present

## 2021-01-06 DIAGNOSIS — R2681 Unsteadiness on feet: Secondary | ICD-10-CM | POA: Diagnosis not present

## 2021-01-06 DIAGNOSIS — M6281 Muscle weakness (generalized): Secondary | ICD-10-CM | POA: Diagnosis not present

## 2021-01-06 DIAGNOSIS — R1314 Dysphagia, pharyngoesophageal phase: Secondary | ICD-10-CM | POA: Diagnosis not present

## 2021-01-08 ENCOUNTER — Telehealth: Payer: Self-pay

## 2021-01-08 DIAGNOSIS — G4733 Obstructive sleep apnea (adult) (pediatric): Secondary | ICD-10-CM

## 2021-01-08 DIAGNOSIS — R262 Difficulty in walking, not elsewhere classified: Secondary | ICD-10-CM | POA: Diagnosis not present

## 2021-01-08 DIAGNOSIS — R531 Weakness: Secondary | ICD-10-CM | POA: Diagnosis not present

## 2021-01-08 DIAGNOSIS — R2681 Unsteadiness on feet: Secondary | ICD-10-CM | POA: Diagnosis not present

## 2021-01-08 DIAGNOSIS — R1310 Dysphagia, unspecified: Secondary | ICD-10-CM | POA: Diagnosis not present

## 2021-01-08 DIAGNOSIS — R1314 Dysphagia, pharyngoesophageal phase: Secondary | ICD-10-CM | POA: Diagnosis not present

## 2021-01-08 DIAGNOSIS — M6281 Muscle weakness (generalized): Secondary | ICD-10-CM | POA: Diagnosis not present

## 2021-01-08 NOTE — Telephone Encounter (Signed)
Melissa returned call and confirmed information was received. Per the insurance documentation is required to further process order. An addendum needs to be made to last ov note dated 12/19/2020 stating something along the lines that patient is using and benefiting from the use of CPAP machine.  Manxie please make the necessary addendum and advise with complete   Thanks,  S.Chrae B/CMA

## 2021-01-08 NOTE — Telephone Encounter (Signed)
Below is Manxie's reply via routing comment (not a permanent part of patients chart )  Mast, Man X, NP  Logan Bores, CMA Caller: Unspecified (Today, 12:51 PM) Thank you, lets try the general setting for CPAP.   Order faxed to F) [phone]  I called Melissa and left a detailed message wit a status update

## 2021-01-08 NOTE — Telephone Encounter (Signed)
Incoming call received on voicemail from [place] with [facility] to follow-up on a call placed to our office on 12/31/20 regarding patients CPAP titration settings. Per Melissa if we do not know the settings or have access to the sleep study we can authorize for the general settings of 4-20 titration.   It appears that [facility] asked for the medical assistant to locate the Sleep Study results. I can not see that this was followed through.  I reviewed patients chart and was unable to find documentation of sleep study or previous CPAP settings.   Manxie please advise if you will agree to the general settings recommended by Melissa of 4-20, if you a RX is pending for you to sign and I will fax to [facility].

## 2021-01-09 DIAGNOSIS — M353 Polymyalgia rheumatica: Secondary | ICD-10-CM | POA: Diagnosis not present

## 2021-01-09 DIAGNOSIS — R1310 Dysphagia, unspecified: Secondary | ICD-10-CM | POA: Diagnosis not present

## 2021-01-09 DIAGNOSIS — R531 Weakness: Secondary | ICD-10-CM | POA: Diagnosis not present

## 2021-01-09 DIAGNOSIS — Z8616 Personal history of COVID-19: Secondary | ICD-10-CM | POA: Diagnosis not present

## 2021-01-09 DIAGNOSIS — R1314 Dysphagia, pharyngoesophageal phase: Secondary | ICD-10-CM | POA: Diagnosis not present

## 2021-01-09 DIAGNOSIS — R262 Difficulty in walking, not elsewhere classified: Secondary | ICD-10-CM | POA: Diagnosis not present

## 2021-01-09 DIAGNOSIS — R2681 Unsteadiness on feet: Secondary | ICD-10-CM | POA: Diagnosis not present

## 2021-01-09 DIAGNOSIS — M6281 Muscle weakness (generalized): Secondary | ICD-10-CM | POA: Diagnosis not present

## 2021-01-09 DIAGNOSIS — N3946 Mixed incontinence: Secondary | ICD-10-CM | POA: Diagnosis not present

## 2021-01-09 NOTE — Telephone Encounter (Signed)
It appears addendum was made

## 2021-01-09 NOTE — Telephone Encounter (Signed)
Coummunity message sent to [facility]

## 2021-01-12 DIAGNOSIS — R262 Difficulty in walking, not elsewhere classified: Secondary | ICD-10-CM | POA: Diagnosis not present

## 2021-01-12 DIAGNOSIS — M6281 Muscle weakness (generalized): Secondary | ICD-10-CM | POA: Diagnosis not present

## 2021-01-12 DIAGNOSIS — R531 Weakness: Secondary | ICD-10-CM | POA: Diagnosis not present

## 2021-01-12 DIAGNOSIS — R1314 Dysphagia, pharyngoesophageal phase: Secondary | ICD-10-CM | POA: Diagnosis not present

## 2021-01-12 DIAGNOSIS — R2681 Unsteadiness on feet: Secondary | ICD-10-CM | POA: Diagnosis not present

## 2021-01-12 DIAGNOSIS — N3946 Mixed incontinence: Secondary | ICD-10-CM | POA: Diagnosis not present

## 2021-01-13 DIAGNOSIS — R2681 Unsteadiness on feet: Secondary | ICD-10-CM | POA: Diagnosis not present

## 2021-01-13 DIAGNOSIS — R531 Weakness: Secondary | ICD-10-CM | POA: Diagnosis not present

## 2021-01-13 DIAGNOSIS — N3946 Mixed incontinence: Secondary | ICD-10-CM | POA: Diagnosis not present

## 2021-01-13 DIAGNOSIS — R262 Difficulty in walking, not elsewhere classified: Secondary | ICD-10-CM | POA: Diagnosis not present

## 2021-01-13 DIAGNOSIS — R1314 Dysphagia, pharyngoesophageal phase: Secondary | ICD-10-CM | POA: Diagnosis not present

## 2021-01-13 DIAGNOSIS — M6281 Muscle weakness (generalized): Secondary | ICD-10-CM | POA: Diagnosis not present

## 2021-01-14 DIAGNOSIS — R1314 Dysphagia, pharyngoesophageal phase: Secondary | ICD-10-CM | POA: Diagnosis not present

## 2021-01-14 DIAGNOSIS — N3946 Mixed incontinence: Secondary | ICD-10-CM | POA: Diagnosis not present

## 2021-01-14 DIAGNOSIS — R2681 Unsteadiness on feet: Secondary | ICD-10-CM | POA: Diagnosis not present

## 2021-01-14 DIAGNOSIS — M6281 Muscle weakness (generalized): Secondary | ICD-10-CM | POA: Diagnosis not present

## 2021-01-14 DIAGNOSIS — R262 Difficulty in walking, not elsewhere classified: Secondary | ICD-10-CM | POA: Diagnosis not present

## 2021-01-14 DIAGNOSIS — R531 Weakness: Secondary | ICD-10-CM | POA: Diagnosis not present

## 2021-01-15 DIAGNOSIS — R262 Difficulty in walking, not elsewhere classified: Secondary | ICD-10-CM | POA: Diagnosis not present

## 2021-01-15 DIAGNOSIS — M6281 Muscle weakness (generalized): Secondary | ICD-10-CM | POA: Diagnosis not present

## 2021-01-15 DIAGNOSIS — N3946 Mixed incontinence: Secondary | ICD-10-CM | POA: Diagnosis not present

## 2021-01-15 DIAGNOSIS — R1314 Dysphagia, pharyngoesophageal phase: Secondary | ICD-10-CM | POA: Diagnosis not present

## 2021-01-15 DIAGNOSIS — R2681 Unsteadiness on feet: Secondary | ICD-10-CM | POA: Diagnosis not present

## 2021-01-15 DIAGNOSIS — R531 Weakness: Secondary | ICD-10-CM | POA: Diagnosis not present

## 2021-01-16 ENCOUNTER — Other Ambulatory Visit: Payer: Self-pay | Admitting: Nurse Practitioner

## 2021-01-16 ENCOUNTER — Telehealth: Payer: Self-pay | Admitting: *Deleted

## 2021-01-16 DIAGNOSIS — R531 Weakness: Secondary | ICD-10-CM | POA: Diagnosis not present

## 2021-01-16 DIAGNOSIS — N3946 Mixed incontinence: Secondary | ICD-10-CM | POA: Diagnosis not present

## 2021-01-16 DIAGNOSIS — R2681 Unsteadiness on feet: Secondary | ICD-10-CM | POA: Diagnosis not present

## 2021-01-16 DIAGNOSIS — M6281 Muscle weakness (generalized): Secondary | ICD-10-CM | POA: Diagnosis not present

## 2021-01-16 DIAGNOSIS — G4733 Obstructive sleep apnea (adult) (pediatric): Secondary | ICD-10-CM

## 2021-01-16 DIAGNOSIS — R1314 Dysphagia, pharyngoesophageal phase: Secondary | ICD-10-CM | POA: Diagnosis not present

## 2021-01-16 DIAGNOSIS — R262 Difficulty in walking, not elsewhere classified: Secondary | ICD-10-CM | POA: Diagnosis not present

## 2021-01-16 NOTE — Progress Notes (Signed)
I ordered home sleep test. Thanks.

## 2021-01-16 NOTE — Telephone Encounter (Signed)
Mast, Man X, NP  You 1 hour ago (10:13 AM)   Thanks, I will place the order.

## 2021-01-16 NOTE — Telephone Encounter (Signed)
Mariette, daughter, called and stated that [place] with [facility] called her and stated that patient will need a new Home Sleep Study done because they cannot get the setting off of the old machine and cannot locate the study with it being so old.  Stated that they are requesting a Home Sleep Study and then they will be able to put her on a Auto CPAP Machine.   Please Place an order for Home Sleep Study.

## 2021-01-16 NOTE — Addendum Note (Signed)
Addended by: Rafael Bihari A on: 01/16/2021 11:51 AM   Modules accepted: Orders

## 2021-01-19 DIAGNOSIS — R2681 Unsteadiness on feet: Secondary | ICD-10-CM | POA: Diagnosis not present

## 2021-01-19 DIAGNOSIS — N3946 Mixed incontinence: Secondary | ICD-10-CM | POA: Diagnosis not present

## 2021-01-19 DIAGNOSIS — M6281 Muscle weakness (generalized): Secondary | ICD-10-CM | POA: Diagnosis not present

## 2021-01-19 DIAGNOSIS — R531 Weakness: Secondary | ICD-10-CM | POA: Diagnosis not present

## 2021-01-19 DIAGNOSIS — R262 Difficulty in walking, not elsewhere classified: Secondary | ICD-10-CM | POA: Diagnosis not present

## 2021-01-19 DIAGNOSIS — R1314 Dysphagia, pharyngoesophageal phase: Secondary | ICD-10-CM | POA: Diagnosis not present

## 2021-01-20 DIAGNOSIS — N3946 Mixed incontinence: Secondary | ICD-10-CM | POA: Diagnosis not present

## 2021-01-20 DIAGNOSIS — R1314 Dysphagia, pharyngoesophageal phase: Secondary | ICD-10-CM | POA: Diagnosis not present

## 2021-01-20 DIAGNOSIS — M6281 Muscle weakness (generalized): Secondary | ICD-10-CM | POA: Diagnosis not present

## 2021-01-20 DIAGNOSIS — R2681 Unsteadiness on feet: Secondary | ICD-10-CM | POA: Diagnosis not present

## 2021-01-20 DIAGNOSIS — R531 Weakness: Secondary | ICD-10-CM | POA: Diagnosis not present

## 2021-01-20 DIAGNOSIS — R262 Difficulty in walking, not elsewhere classified: Secondary | ICD-10-CM | POA: Diagnosis not present

## 2021-01-21 DIAGNOSIS — R1314 Dysphagia, pharyngoesophageal phase: Secondary | ICD-10-CM | POA: Diagnosis not present

## 2021-01-21 DIAGNOSIS — R262 Difficulty in walking, not elsewhere classified: Secondary | ICD-10-CM | POA: Diagnosis not present

## 2021-01-21 DIAGNOSIS — M6281 Muscle weakness (generalized): Secondary | ICD-10-CM | POA: Diagnosis not present

## 2021-01-21 DIAGNOSIS — N3946 Mixed incontinence: Secondary | ICD-10-CM | POA: Diagnosis not present

## 2021-01-21 DIAGNOSIS — R531 Weakness: Secondary | ICD-10-CM | POA: Diagnosis not present

## 2021-01-21 DIAGNOSIS — R2681 Unsteadiness on feet: Secondary | ICD-10-CM | POA: Diagnosis not present

## 2021-01-23 DIAGNOSIS — R2681 Unsteadiness on feet: Secondary | ICD-10-CM | POA: Diagnosis not present

## 2021-01-23 DIAGNOSIS — N3946 Mixed incontinence: Secondary | ICD-10-CM | POA: Diagnosis not present

## 2021-01-23 DIAGNOSIS — R262 Difficulty in walking, not elsewhere classified: Secondary | ICD-10-CM | POA: Diagnosis not present

## 2021-01-23 DIAGNOSIS — M6281 Muscle weakness (generalized): Secondary | ICD-10-CM | POA: Diagnosis not present

## 2021-01-23 DIAGNOSIS — R531 Weakness: Secondary | ICD-10-CM | POA: Diagnosis not present

## 2021-01-23 DIAGNOSIS — R1314 Dysphagia, pharyngoesophageal phase: Secondary | ICD-10-CM | POA: Diagnosis not present

## 2021-01-26 DIAGNOSIS — R2681 Unsteadiness on feet: Secondary | ICD-10-CM | POA: Diagnosis not present

## 2021-01-26 DIAGNOSIS — R262 Difficulty in walking, not elsewhere classified: Secondary | ICD-10-CM | POA: Diagnosis not present

## 2021-01-26 DIAGNOSIS — N3946 Mixed incontinence: Secondary | ICD-10-CM | POA: Diagnosis not present

## 2021-01-26 DIAGNOSIS — R1314 Dysphagia, pharyngoesophageal phase: Secondary | ICD-10-CM | POA: Diagnosis not present

## 2021-01-26 DIAGNOSIS — R531 Weakness: Secondary | ICD-10-CM | POA: Diagnosis not present

## 2021-01-26 DIAGNOSIS — M6281 Muscle weakness (generalized): Secondary | ICD-10-CM | POA: Diagnosis not present

## 2021-01-28 DIAGNOSIS — R1314 Dysphagia, pharyngoesophageal phase: Secondary | ICD-10-CM | POA: Diagnosis not present

## 2021-01-28 DIAGNOSIS — R2681 Unsteadiness on feet: Secondary | ICD-10-CM | POA: Diagnosis not present

## 2021-01-28 DIAGNOSIS — R262 Difficulty in walking, not elsewhere classified: Secondary | ICD-10-CM | POA: Diagnosis not present

## 2021-01-28 DIAGNOSIS — M6281 Muscle weakness (generalized): Secondary | ICD-10-CM | POA: Diagnosis not present

## 2021-01-28 DIAGNOSIS — N3946 Mixed incontinence: Secondary | ICD-10-CM | POA: Diagnosis not present

## 2021-01-28 DIAGNOSIS — R531 Weakness: Secondary | ICD-10-CM | POA: Diagnosis not present

## 2021-01-30 DIAGNOSIS — R531 Weakness: Secondary | ICD-10-CM | POA: Diagnosis not present

## 2021-01-30 DIAGNOSIS — R1314 Dysphagia, pharyngoesophageal phase: Secondary | ICD-10-CM | POA: Diagnosis not present

## 2021-01-30 DIAGNOSIS — R262 Difficulty in walking, not elsewhere classified: Secondary | ICD-10-CM | POA: Diagnosis not present

## 2021-01-30 DIAGNOSIS — R2681 Unsteadiness on feet: Secondary | ICD-10-CM | POA: Diagnosis not present

## 2021-01-30 DIAGNOSIS — N3946 Mixed incontinence: Secondary | ICD-10-CM | POA: Diagnosis not present

## 2021-01-30 DIAGNOSIS — M6281 Muscle weakness (generalized): Secondary | ICD-10-CM | POA: Diagnosis not present

## 2021-02-02 ENCOUNTER — Ambulatory Visit: Payer: Medicare Other | Attending: Nurse Practitioner | Admitting: Neurology

## 2021-02-02 ENCOUNTER — Other Ambulatory Visit: Payer: Self-pay

## 2021-02-02 DIAGNOSIS — M6281 Muscle weakness (generalized): Secondary | ICD-10-CM | POA: Diagnosis not present

## 2021-02-02 DIAGNOSIS — N3946 Mixed incontinence: Secondary | ICD-10-CM | POA: Diagnosis not present

## 2021-02-02 DIAGNOSIS — R2681 Unsteadiness on feet: Secondary | ICD-10-CM | POA: Diagnosis not present

## 2021-02-02 DIAGNOSIS — R1314 Dysphagia, pharyngoesophageal phase: Secondary | ICD-10-CM | POA: Diagnosis not present

## 2021-02-02 DIAGNOSIS — G4733 Obstructive sleep apnea (adult) (pediatric): Secondary | ICD-10-CM | POA: Diagnosis not present

## 2021-02-02 DIAGNOSIS — R531 Weakness: Secondary | ICD-10-CM | POA: Diagnosis not present

## 2021-02-02 DIAGNOSIS — R262 Difficulty in walking, not elsewhere classified: Secondary | ICD-10-CM | POA: Diagnosis not present

## 2021-02-04 DIAGNOSIS — R2681 Unsteadiness on feet: Secondary | ICD-10-CM | POA: Diagnosis not present

## 2021-02-04 DIAGNOSIS — R531 Weakness: Secondary | ICD-10-CM | POA: Diagnosis not present

## 2021-02-04 DIAGNOSIS — N3946 Mixed incontinence: Secondary | ICD-10-CM | POA: Diagnosis not present

## 2021-02-04 DIAGNOSIS — R262 Difficulty in walking, not elsewhere classified: Secondary | ICD-10-CM | POA: Diagnosis not present

## 2021-02-04 DIAGNOSIS — R1314 Dysphagia, pharyngoesophageal phase: Secondary | ICD-10-CM | POA: Diagnosis not present

## 2021-02-04 DIAGNOSIS — M6281 Muscle weakness (generalized): Secondary | ICD-10-CM | POA: Diagnosis not present

## 2021-02-06 DIAGNOSIS — R531 Weakness: Secondary | ICD-10-CM | POA: Diagnosis not present

## 2021-02-06 DIAGNOSIS — R1314 Dysphagia, pharyngoesophageal phase: Secondary | ICD-10-CM | POA: Diagnosis not present

## 2021-02-06 DIAGNOSIS — R2681 Unsteadiness on feet: Secondary | ICD-10-CM | POA: Diagnosis not present

## 2021-02-06 DIAGNOSIS — M6281 Muscle weakness (generalized): Secondary | ICD-10-CM | POA: Diagnosis not present

## 2021-02-06 DIAGNOSIS — N3946 Mixed incontinence: Secondary | ICD-10-CM | POA: Diagnosis not present

## 2021-02-06 DIAGNOSIS — R262 Difficulty in walking, not elsewhere classified: Secondary | ICD-10-CM | POA: Diagnosis not present

## 2021-02-09 DIAGNOSIS — R531 Weakness: Secondary | ICD-10-CM | POA: Diagnosis not present

## 2021-02-09 DIAGNOSIS — M6281 Muscle weakness (generalized): Secondary | ICD-10-CM | POA: Diagnosis not present

## 2021-02-09 DIAGNOSIS — N3946 Mixed incontinence: Secondary | ICD-10-CM | POA: Diagnosis not present

## 2021-02-09 DIAGNOSIS — R2681 Unsteadiness on feet: Secondary | ICD-10-CM | POA: Diagnosis not present

## 2021-02-09 DIAGNOSIS — Z8616 Personal history of COVID-19: Secondary | ICD-10-CM | POA: Diagnosis not present

## 2021-02-09 DIAGNOSIS — R262 Difficulty in walking, not elsewhere classified: Secondary | ICD-10-CM | POA: Diagnosis not present

## 2021-02-09 DIAGNOSIS — M353 Polymyalgia rheumatica: Secondary | ICD-10-CM | POA: Diagnosis not present

## 2021-02-10 ENCOUNTER — Telehealth: Payer: Self-pay | Admitting: *Deleted

## 2021-02-10 ENCOUNTER — Encounter: Payer: Medicare Other | Admitting: Internal Medicine

## 2021-02-10 DIAGNOSIS — M353 Polymyalgia rheumatica: Secondary | ICD-10-CM | POA: Diagnosis not present

## 2021-02-10 DIAGNOSIS — R262 Difficulty in walking, not elsewhere classified: Secondary | ICD-10-CM | POA: Diagnosis not present

## 2021-02-10 DIAGNOSIS — R2681 Unsteadiness on feet: Secondary | ICD-10-CM | POA: Diagnosis not present

## 2021-02-10 DIAGNOSIS — R531 Weakness: Secondary | ICD-10-CM | POA: Diagnosis not present

## 2021-02-10 DIAGNOSIS — M6281 Muscle weakness (generalized): Secondary | ICD-10-CM | POA: Diagnosis not present

## 2021-02-10 DIAGNOSIS — N3946 Mixed incontinence: Secondary | ICD-10-CM | POA: Diagnosis not present

## 2021-02-10 NOTE — Telephone Encounter (Signed)
[name], MD  You 57 minutes ago (11:32 AM)   I have written new orders

## 2021-02-10 NOTE — Telephone Encounter (Signed)
Daughter called and stated that Patient is having loose stools with bowel incontinence  Stated that she would like to have the daily Miralax changed to QOD or Every 3rd day.   Needs an order given to the facility.  Please Advise.

## 2021-02-10 NOTE — Telephone Encounter (Signed)
Mast, Man X, NP  You 7 minutes ago (10:56 AM)   The patient resides [facility]. Dr. Lyndel Safe is at [facility] today. Thanks.

## 2021-02-11 DIAGNOSIS — R2681 Unsteadiness on feet: Secondary | ICD-10-CM | POA: Diagnosis not present

## 2021-02-11 DIAGNOSIS — R262 Difficulty in walking, not elsewhere classified: Secondary | ICD-10-CM | POA: Diagnosis not present

## 2021-02-11 DIAGNOSIS — R531 Weakness: Secondary | ICD-10-CM | POA: Diagnosis not present

## 2021-02-11 DIAGNOSIS — N3946 Mixed incontinence: Secondary | ICD-10-CM | POA: Diagnosis not present

## 2021-02-11 DIAGNOSIS — M6281 Muscle weakness (generalized): Secondary | ICD-10-CM | POA: Diagnosis not present

## 2021-02-11 DIAGNOSIS — M353 Polymyalgia rheumatica: Secondary | ICD-10-CM | POA: Diagnosis not present

## 2021-02-13 ENCOUNTER — Telehealth: Payer: Self-pay | Admitting: *Deleted

## 2021-02-13 DIAGNOSIS — M6281 Muscle weakness (generalized): Secondary | ICD-10-CM | POA: Diagnosis not present

## 2021-02-13 DIAGNOSIS — N3946 Mixed incontinence: Secondary | ICD-10-CM | POA: Diagnosis not present

## 2021-02-13 DIAGNOSIS — R262 Difficulty in walking, not elsewhere classified: Secondary | ICD-10-CM | POA: Diagnosis not present

## 2021-02-13 DIAGNOSIS — M353 Polymyalgia rheumatica: Secondary | ICD-10-CM | POA: Diagnosis not present

## 2021-02-13 DIAGNOSIS — R2681 Unsteadiness on feet: Secondary | ICD-10-CM | POA: Diagnosis not present

## 2021-02-13 DIAGNOSIS — R531 Weakness: Secondary | ICD-10-CM | POA: Diagnosis not present

## 2021-02-13 NOTE — Telephone Encounter (Signed)
Daughter called wanting to know if the Home Sleep Study was received and faxed to [facility] for a CPAP Machine.   Results printed and faxed to [facility] with [facility] Fax: [phone]

## 2021-02-16 DIAGNOSIS — M6281 Muscle weakness (generalized): Secondary | ICD-10-CM | POA: Diagnosis not present

## 2021-02-16 DIAGNOSIS — N3946 Mixed incontinence: Secondary | ICD-10-CM | POA: Diagnosis not present

## 2021-02-16 DIAGNOSIS — R531 Weakness: Secondary | ICD-10-CM | POA: Diagnosis not present

## 2021-02-16 DIAGNOSIS — R2681 Unsteadiness on feet: Secondary | ICD-10-CM | POA: Diagnosis not present

## 2021-02-16 DIAGNOSIS — M353 Polymyalgia rheumatica: Secondary | ICD-10-CM | POA: Diagnosis not present

## 2021-02-16 DIAGNOSIS — R262 Difficulty in walking, not elsewhere classified: Secondary | ICD-10-CM | POA: Diagnosis not present

## 2021-02-16 NOTE — Procedures (Signed)
    [place] A. Merlene Laughter, MD     [email or web address]             HOME SLEEP TEST  LOCATION: [place]   Patient Name: [name], [name] Date: 02/02/2021 Gender: Female D.O.B: 12/30/1928 Age (years): 11 Referring Provider: Man Mast NP Height (inches): 60 Interpreting Physician: Phillips Odor MD, ABSM Weight (lbs): 150 RPSGT: Peak, Robert BMI: 29 MRN: [id number] Neck Size: CLINICAL INFORMATION Sleep Study Type: HST     Indication for sleep study: N/A     Epworth Sleepiness Score: N/A  SLEEP STUDY TECHNIQUE A multi-channel overnight portable sleep study was performed. The channels recorded were: nasal airflow, thoracic respiratory movement, and oxygen saturation with a pulse oximetry. Snoring was also monitored.  MEDICATIONS Patient self administered medications include: N/A.  Current Outpatient Medications:    acetaminophen (TYLENOL) 500 MG tablet, Take 500 mg by mouth every 6 (six) hours as needed for mild pain or moderate pain., Disp: , Rfl:    Calcium-Phosphorus-Vitamin D (CALCIUM/VITAMIN D3/ADULT GUMMY PO), Take 1 tablet by mouth See admin instructions. 500 mg calcium, 1000 iu vitamin D3, Phosphorous '230mg'$ . 2 daily, Disp: , Rfl:    cholecalciferol (VITAMIN D3) 25 MCG (1000 UNIT) tablet, Take 1,000 Units by mouth daily., Disp: , Rfl:    Collagen-Boron-Hyaluronic Acid ([employer] JOINT HEALTH TRIPLE ACTION PO), Take 1 tablet by mouth daily., Disp: , Rfl:    denosumab (PROLIA) 60 MG/ML SOSY injection, Inject 60 mg into the skin every 6 (six) months. On 1st Monday of every 6th mo., Disp: , Rfl:    famotidine (PEPCID) 20 MG tablet, Take 1 tablet (20 mg total) by mouth daily., Disp:  , Rfl:    Multiple Vitamins-Minerals (HM MULTIVITAMIN ADULT GUMMY PO), Take 1 tablet by mouth daily., Disp: , Rfl:    Multiple Vitamins-Minerals (OCUVITE EYE + MULTI PO), Take 1 tablet by mouth daily. , Disp: , Rfl:    NON FORMULARY, Iron plus Vitamin C tablet;  '65mg'$ /'125mg'$ ; amt: '65mg'$ /'125mg'$ ; oral Once A Day, Disp: , Rfl:    NON FORMULARY, Heal and Soothe capsule; 300 mg; amt: 900 mg; oral Once A Day, Disp: , Rfl:    polyethylene glycol (MIRALAX / GLYCOLAX) 17 g packet, Take 17 g by mouth daily., Disp: , Rfl:    potassium chloride SA (KLOR-CON) 20 MEQ tablet, Take 40 mEq by mouth daily. Dilute tablets in 58m of water to dissolve, Disp: , Rfl:    PREDNISONE PO, Take 9 mg by mouth daily., Disp: , Rfl:    torsemide (DEMADEX) 20 MG tablet, Take 20 mg by mouth daily., Disp: , Rfl:    SLEEP ARCHITECTURE Patient was studied for 396 minutes. The sleep efficiency was 82.5 % and the patient was supine for 83.1%. The arousal index was 0.0 per hour.  RESPIRATORY PARAMETERS The overall AHI was 71.1 per hour, with a central apnea index of 1.4 per hour.  The oxygen nadir was 83% during sleep.     CARDIAC DATA Mean heart rate during sleep was 64.5 bpm.  IMPRESSIONS Severe obstructive sleep apnea occurred during this study (AHI = 71.1/h). Auto PAP 8-12 is recommended.  KDelano Metz MD Diplomate, American Board of Sleep Medicine.  ELECTRONICALLY SIGNED ON:  02/16/2021, 8:54 AM [facility] PH: [phone]   FX: [phone]

## 2021-02-17 DIAGNOSIS — R531 Weakness: Secondary | ICD-10-CM | POA: Diagnosis not present

## 2021-02-17 DIAGNOSIS — R262 Difficulty in walking, not elsewhere classified: Secondary | ICD-10-CM | POA: Diagnosis not present

## 2021-02-17 DIAGNOSIS — M6281 Muscle weakness (generalized): Secondary | ICD-10-CM | POA: Diagnosis not present

## 2021-02-17 DIAGNOSIS — N3946 Mixed incontinence: Secondary | ICD-10-CM | POA: Diagnosis not present

## 2021-02-17 DIAGNOSIS — R2681 Unsteadiness on feet: Secondary | ICD-10-CM | POA: Diagnosis not present

## 2021-02-17 DIAGNOSIS — M353 Polymyalgia rheumatica: Secondary | ICD-10-CM | POA: Diagnosis not present

## 2021-02-18 DIAGNOSIS — R2681 Unsteadiness on feet: Secondary | ICD-10-CM | POA: Diagnosis not present

## 2021-02-18 DIAGNOSIS — R262 Difficulty in walking, not elsewhere classified: Secondary | ICD-10-CM | POA: Diagnosis not present

## 2021-02-18 DIAGNOSIS — M6281 Muscle weakness (generalized): Secondary | ICD-10-CM | POA: Diagnosis not present

## 2021-02-18 DIAGNOSIS — M353 Polymyalgia rheumatica: Secondary | ICD-10-CM | POA: Diagnosis not present

## 2021-02-18 DIAGNOSIS — N3946 Mixed incontinence: Secondary | ICD-10-CM | POA: Diagnosis not present

## 2021-02-18 DIAGNOSIS — R531 Weakness: Secondary | ICD-10-CM | POA: Diagnosis not present

## 2021-02-20 DIAGNOSIS — M6281 Muscle weakness (generalized): Secondary | ICD-10-CM | POA: Diagnosis not present

## 2021-02-20 DIAGNOSIS — R262 Difficulty in walking, not elsewhere classified: Secondary | ICD-10-CM | POA: Diagnosis not present

## 2021-02-20 DIAGNOSIS — M353 Polymyalgia rheumatica: Secondary | ICD-10-CM | POA: Diagnosis not present

## 2021-02-20 DIAGNOSIS — R531 Weakness: Secondary | ICD-10-CM | POA: Diagnosis not present

## 2021-02-20 DIAGNOSIS — R2681 Unsteadiness on feet: Secondary | ICD-10-CM | POA: Diagnosis not present

## 2021-02-20 DIAGNOSIS — N3946 Mixed incontinence: Secondary | ICD-10-CM | POA: Diagnosis not present

## 2021-02-23 DIAGNOSIS — N3946 Mixed incontinence: Secondary | ICD-10-CM | POA: Diagnosis not present

## 2021-02-23 DIAGNOSIS — M6281 Muscle weakness (generalized): Secondary | ICD-10-CM | POA: Diagnosis not present

## 2021-02-23 DIAGNOSIS — M353 Polymyalgia rheumatica: Secondary | ICD-10-CM | POA: Diagnosis not present

## 2021-02-23 DIAGNOSIS — R2681 Unsteadiness on feet: Secondary | ICD-10-CM | POA: Diagnosis not present

## 2021-02-23 DIAGNOSIS — R262 Difficulty in walking, not elsewhere classified: Secondary | ICD-10-CM | POA: Diagnosis not present

## 2021-02-23 DIAGNOSIS — R531 Weakness: Secondary | ICD-10-CM | POA: Diagnosis not present

## 2021-02-25 DIAGNOSIS — M353 Polymyalgia rheumatica: Secondary | ICD-10-CM | POA: Diagnosis not present

## 2021-02-25 DIAGNOSIS — M6281 Muscle weakness (generalized): Secondary | ICD-10-CM | POA: Diagnosis not present

## 2021-02-25 DIAGNOSIS — N3946 Mixed incontinence: Secondary | ICD-10-CM | POA: Diagnosis not present

## 2021-02-25 DIAGNOSIS — R2681 Unsteadiness on feet: Secondary | ICD-10-CM | POA: Diagnosis not present

## 2021-02-25 DIAGNOSIS — R531 Weakness: Secondary | ICD-10-CM | POA: Diagnosis not present

## 2021-02-25 DIAGNOSIS — R262 Difficulty in walking, not elsewhere classified: Secondary | ICD-10-CM | POA: Diagnosis not present

## 2021-02-27 ENCOUNTER — Telehealth: Payer: Self-pay | Admitting: *Deleted

## 2021-02-27 DIAGNOSIS — M353 Polymyalgia rheumatica: Secondary | ICD-10-CM | POA: Diagnosis not present

## 2021-02-27 DIAGNOSIS — R262 Difficulty in walking, not elsewhere classified: Secondary | ICD-10-CM | POA: Diagnosis not present

## 2021-02-27 DIAGNOSIS — R2681 Unsteadiness on feet: Secondary | ICD-10-CM | POA: Diagnosis not present

## 2021-02-27 DIAGNOSIS — N3946 Mixed incontinence: Secondary | ICD-10-CM | POA: Diagnosis not present

## 2021-02-27 DIAGNOSIS — R531 Weakness: Secondary | ICD-10-CM | POA: Diagnosis not present

## 2021-02-27 DIAGNOSIS — M6281 Muscle weakness (generalized): Secondary | ICD-10-CM | POA: Diagnosis not present

## 2021-02-27 NOTE — Telephone Encounter (Signed)
Patient daughter, Magdalen Spatz called and stated that she would like patient to be evaluated for Parkinsonism.  Stated that patient is having a tremor in her Left Hand and arm and Hallucinations.    Please Advise.

## 2021-03-02 ENCOUNTER — Non-Acute Institutional Stay: Payer: Medicare Other | Admitting: Nurse Practitioner

## 2021-03-02 ENCOUNTER — Encounter: Payer: Self-pay | Admitting: Nurse Practitioner

## 2021-03-02 DIAGNOSIS — N3946 Mixed incontinence: Secondary | ICD-10-CM | POA: Diagnosis not present

## 2021-03-02 DIAGNOSIS — R1319 Other dysphagia: Secondary | ICD-10-CM | POA: Diagnosis not present

## 2021-03-02 DIAGNOSIS — G4733 Obstructive sleep apnea (adult) (pediatric): Secondary | ICD-10-CM | POA: Diagnosis not present

## 2021-03-02 DIAGNOSIS — I5032 Chronic diastolic (congestive) heart failure: Secondary | ICD-10-CM | POA: Diagnosis not present

## 2021-03-02 DIAGNOSIS — R531 Weakness: Secondary | ICD-10-CM | POA: Diagnosis not present

## 2021-03-02 DIAGNOSIS — S0003XA Contusion of scalp, initial encounter: Secondary | ICD-10-CM | POA: Insufficient documentation

## 2021-03-02 DIAGNOSIS — R262 Difficulty in walking, not elsewhere classified: Secondary | ICD-10-CM | POA: Diagnosis not present

## 2021-03-02 DIAGNOSIS — D696 Thrombocytopenia, unspecified: Secondary | ICD-10-CM

## 2021-03-02 DIAGNOSIS — R251 Tremor, unspecified: Secondary | ICD-10-CM | POA: Diagnosis not present

## 2021-03-02 DIAGNOSIS — R2681 Unsteadiness on feet: Secondary | ICD-10-CM | POA: Diagnosis not present

## 2021-03-02 DIAGNOSIS — M6281 Muscle weakness (generalized): Secondary | ICD-10-CM | POA: Diagnosis not present

## 2021-03-02 DIAGNOSIS — K5901 Slow transit constipation: Secondary | ICD-10-CM | POA: Diagnosis not present

## 2021-03-02 DIAGNOSIS — M8580 Other specified disorders of bone density and structure, unspecified site: Secondary | ICD-10-CM

## 2021-03-02 DIAGNOSIS — R441 Visual hallucinations: Secondary | ICD-10-CM

## 2021-03-02 DIAGNOSIS — M353 Polymyalgia rheumatica: Secondary | ICD-10-CM | POA: Diagnosis not present

## 2021-03-02 DIAGNOSIS — F419 Anxiety disorder, unspecified: Secondary | ICD-10-CM

## 2021-03-02 NOTE — Assessment & Plan Note (Signed)
Stable, takes MiraLax  

## 2021-03-02 NOTE — Assessment & Plan Note (Signed)
03/01/21 fall the patient lost balance when putting basket up, resulted a small hematoma right occiput, no new focal neurological symptoms.
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
## 2021-03-02 NOTE — Assessment & Plan Note (Addendum)
Dr. Lyndel Safe decreased Prednisone to 66m 03/03/21 in attempt of eliminating contributory etiology of hallucination,  saw rheumatology 02/26/20. ESR 36, CRP 15.4 08/07/20

## 2021-03-02 NOTE — Assessment & Plan Note (Signed)
Woke up around 3-4 am sometimes, but desires delay meds.

## 2021-03-02 NOTE — Assessment & Plan Note (Signed)
Dysphagia diet/achalasia of esophagus, s/p dilation, on Pepcid, Pureed diet

## 2021-03-02 NOTE — Assessment & Plan Note (Signed)
plt 124 10/30/20

## 2021-03-02 NOTE — Assessment & Plan Note (Signed)
Takes Prolia.

## 2021-03-02 NOTE — Assessment & Plan Note (Signed)
CPAP/O2 at night, underwent sleep study.
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
## 2021-03-02 NOTE — Assessment & Plan Note (Addendum)
No tremor noted upon my examination today  may consider referral to neurology if worsens in setting of hallucination. Update CBC/diff, CMP/eGFR, TSH, Hgb a1c, lipid panel.

## 2021-03-02 NOTE — Assessment & Plan Note (Signed)
CHF, minimal edema BLE, BNP 122 in the past, echocardiogram  EF 0000000, grade I diastolic dysfunction.  CXR bilateral effusions, on Torsemide '20mg'$  qd. Bun/creat 18/0.74 10/30/20. F/u cardiology

## 2021-03-03 DIAGNOSIS — R634 Abnormal weight loss: Secondary | ICD-10-CM | POA: Diagnosis not present

## 2021-03-03 DIAGNOSIS — I10 Essential (primary) hypertension: Secondary | ICD-10-CM | POA: Diagnosis not present

## 2021-03-03 DIAGNOSIS — M353 Polymyalgia rheumatica: Secondary | ICD-10-CM | POA: Diagnosis not present

## 2021-03-03 DIAGNOSIS — D649 Anemia, unspecified: Secondary | ICD-10-CM | POA: Diagnosis not present

## 2021-03-03 DIAGNOSIS — R531 Weakness: Secondary | ICD-10-CM | POA: Diagnosis not present

## 2021-03-03 LAB — BASIC METABOLIC PANEL
BUN: 21 (ref 4–21)
CO2: 35 — AB (ref 13–22)
Chloride: 106 (ref 99–108)
Creatinine: 0.8 (ref 0.5–1.1)
Glucose: 94
Potassium: 3.3 — AB (ref 3.4–5.3)
Sodium: 146 (ref 137–147)

## 2021-03-03 LAB — HEMOGLOBIN A1C: Hemoglobin A1C: 5.3

## 2021-03-03 LAB — LIPID PANEL
Cholesterol: 146 (ref 0–200)
HDL: 51 (ref 35–70)
LDL Cholesterol: 80
LDl/HDL Ratio: 2.9
Triglycerides: 71 (ref 40–160)

## 2021-03-03 LAB — CBC: RBC: 3.64 — AB (ref 3.87–5.11)

## 2021-03-03 LAB — TSH: TSH: 0.5 (ref 0.41–5.90)

## 2021-03-03 LAB — HEPATIC FUNCTION PANEL
ALT: 10 (ref 7–35)
AST: 13 (ref 13–35)
Alkaline Phosphatase: 54 (ref 25–125)
Bilirubin, Total: 0.4

## 2021-03-03 LAB — COMPREHENSIVE METABOLIC PANEL
Albumin: 3.3 — AB (ref 3.5–5.0)
Calcium: 9.2 (ref 8.7–10.7)
Globulin: 2.2

## 2021-03-03 LAB — CBC AND DIFFERENTIAL
HCT: 36 (ref 36–46)
Hemoglobin: 12.3 (ref 12.0–16.0)
Neutrophils Absolute: 3180
Platelets: 109 — AB (ref 150–399)
WBC: 5.9

## 2021-03-04 DIAGNOSIS — M6281 Muscle weakness (generalized): Secondary | ICD-10-CM | POA: Diagnosis not present

## 2021-03-04 DIAGNOSIS — R531 Weakness: Secondary | ICD-10-CM | POA: Diagnosis not present

## 2021-03-04 DIAGNOSIS — R2681 Unsteadiness on feet: Secondary | ICD-10-CM | POA: Diagnosis not present

## 2021-03-04 DIAGNOSIS — R262 Difficulty in walking, not elsewhere classified: Secondary | ICD-10-CM | POA: Diagnosis not present

## 2021-03-04 DIAGNOSIS — M353 Polymyalgia rheumatica: Secondary | ICD-10-CM | POA: Diagnosis not present

## 2021-03-04 DIAGNOSIS — N3946 Mixed incontinence: Secondary | ICD-10-CM | POA: Diagnosis not present

## 2021-03-06 DIAGNOSIS — M353 Polymyalgia rheumatica: Secondary | ICD-10-CM | POA: Diagnosis not present

## 2021-03-06 DIAGNOSIS — R531 Weakness: Secondary | ICD-10-CM | POA: Diagnosis not present

## 2021-03-06 DIAGNOSIS — R2681 Unsteadiness on feet: Secondary | ICD-10-CM | POA: Diagnosis not present

## 2021-03-06 DIAGNOSIS — N3946 Mixed incontinence: Secondary | ICD-10-CM | POA: Diagnosis not present

## 2021-03-06 DIAGNOSIS — M6281 Muscle weakness (generalized): Secondary | ICD-10-CM | POA: Diagnosis not present

## 2021-03-06 DIAGNOSIS — R262 Difficulty in walking, not elsewhere classified: Secondary | ICD-10-CM | POA: Diagnosis not present

## 2021-03-09 DIAGNOSIS — N3946 Mixed incontinence: Secondary | ICD-10-CM | POA: Diagnosis not present

## 2021-03-09 DIAGNOSIS — M6281 Muscle weakness (generalized): Secondary | ICD-10-CM | POA: Diagnosis not present

## 2021-03-09 DIAGNOSIS — R531 Weakness: Secondary | ICD-10-CM | POA: Diagnosis not present

## 2021-03-09 DIAGNOSIS — R262 Difficulty in walking, not elsewhere classified: Secondary | ICD-10-CM | POA: Diagnosis not present

## 2021-03-09 DIAGNOSIS — M353 Polymyalgia rheumatica: Secondary | ICD-10-CM | POA: Diagnosis not present

## 2021-03-09 DIAGNOSIS — R2681 Unsteadiness on feet: Secondary | ICD-10-CM | POA: Diagnosis not present

## 2021-03-11 DIAGNOSIS — M6281 Muscle weakness (generalized): Secondary | ICD-10-CM | POA: Diagnosis not present

## 2021-03-11 DIAGNOSIS — R531 Weakness: Secondary | ICD-10-CM | POA: Diagnosis not present

## 2021-03-11 DIAGNOSIS — R2681 Unsteadiness on feet: Secondary | ICD-10-CM | POA: Diagnosis not present

## 2021-03-11 DIAGNOSIS — M353 Polymyalgia rheumatica: Secondary | ICD-10-CM | POA: Diagnosis not present

## 2021-03-11 DIAGNOSIS — R262 Difficulty in walking, not elsewhere classified: Secondary | ICD-10-CM | POA: Diagnosis not present

## 2021-03-11 DIAGNOSIS — N3946 Mixed incontinence: Secondary | ICD-10-CM | POA: Diagnosis not present

## 2021-03-12 DIAGNOSIS — E876 Hypokalemia: Secondary | ICD-10-CM | POA: Diagnosis not present

## 2021-03-13 DIAGNOSIS — M353 Polymyalgia rheumatica: Secondary | ICD-10-CM | POA: Diagnosis not present

## 2021-03-13 DIAGNOSIS — R531 Weakness: Secondary | ICD-10-CM | POA: Diagnosis not present

## 2021-03-13 DIAGNOSIS — N3946 Mixed incontinence: Secondary | ICD-10-CM | POA: Diagnosis not present

## 2021-03-13 DIAGNOSIS — R2681 Unsteadiness on feet: Secondary | ICD-10-CM | POA: Diagnosis not present

## 2021-03-13 DIAGNOSIS — M6281 Muscle weakness (generalized): Secondary | ICD-10-CM | POA: Diagnosis not present

## 2021-03-13 DIAGNOSIS — Z8616 Personal history of COVID-19: Secondary | ICD-10-CM | POA: Diagnosis not present

## 2021-03-13 DIAGNOSIS — M5459 Other low back pain: Secondary | ICD-10-CM | POA: Diagnosis not present

## 2021-03-13 DIAGNOSIS — Z9181 History of falling: Secondary | ICD-10-CM | POA: Diagnosis not present

## 2021-03-13 LAB — BASIC METABOLIC PANEL
BUN: 16 (ref 4–21)
BUN: 16 (ref 4–21)
CO2: 33 — AB (ref 13–22)
CO2: 33 — AB (ref 13–22)
Chloride: 102 (ref 99–108)
Chloride: 102 (ref 99–108)
Creatinine: 0.7 (ref 0.5–1.1)
Creatinine: 0.7 (ref 0.5–1.1)
Glucose: 100
Glucose: 100
Potassium: 3.3 — AB (ref 3.4–5.3)
Potassium: 3.3 — AB (ref 3.4–5.3)
Sodium: 143 (ref 137–147)
Sodium: 143 (ref 137–147)

## 2021-03-13 LAB — COMPREHENSIVE METABOLIC PANEL
Calcium: 9.6 (ref 8.7–10.7)
Calcium: 9.6 (ref 8.7–10.7)

## 2021-03-16 DIAGNOSIS — Z9181 History of falling: Secondary | ICD-10-CM | POA: Diagnosis not present

## 2021-03-16 DIAGNOSIS — M5459 Other low back pain: Secondary | ICD-10-CM | POA: Diagnosis not present

## 2021-03-16 DIAGNOSIS — R531 Weakness: Secondary | ICD-10-CM | POA: Diagnosis not present

## 2021-03-16 DIAGNOSIS — M6281 Muscle weakness (generalized): Secondary | ICD-10-CM | POA: Diagnosis not present

## 2021-03-16 DIAGNOSIS — R2681 Unsteadiness on feet: Secondary | ICD-10-CM | POA: Diagnosis not present

## 2021-03-16 DIAGNOSIS — N3946 Mixed incontinence: Secondary | ICD-10-CM | POA: Diagnosis not present

## 2021-03-17 ENCOUNTER — Non-Acute Institutional Stay: Payer: Medicare Other | Admitting: Internal Medicine

## 2021-03-17 ENCOUNTER — Encounter: Payer: Self-pay | Admitting: Internal Medicine

## 2021-03-17 DIAGNOSIS — G4733 Obstructive sleep apnea (adult) (pediatric): Secondary | ICD-10-CM | POA: Diagnosis not present

## 2021-03-17 DIAGNOSIS — R1319 Other dysphagia: Secondary | ICD-10-CM

## 2021-03-17 DIAGNOSIS — I5032 Chronic diastolic (congestive) heart failure: Secondary | ICD-10-CM

## 2021-03-17 DIAGNOSIS — R441 Visual hallucinations: Secondary | ICD-10-CM

## 2021-03-17 DIAGNOSIS — Z9181 History of falling: Secondary | ICD-10-CM | POA: Diagnosis not present

## 2021-03-17 DIAGNOSIS — M353 Polymyalgia rheumatica: Secondary | ICD-10-CM | POA: Diagnosis not present

## 2021-03-17 DIAGNOSIS — R251 Tremor, unspecified: Secondary | ICD-10-CM | POA: Diagnosis not present

## 2021-03-17 DIAGNOSIS — N3946 Mixed incontinence: Secondary | ICD-10-CM | POA: Diagnosis not present

## 2021-03-17 DIAGNOSIS — M6281 Muscle weakness (generalized): Secondary | ICD-10-CM | POA: Diagnosis not present

## 2021-03-17 DIAGNOSIS — R2681 Unsteadiness on feet: Secondary | ICD-10-CM | POA: Diagnosis not present

## 2021-03-17 DIAGNOSIS — M5459 Other low back pain: Secondary | ICD-10-CM | POA: Diagnosis not present

## 2021-03-17 DIAGNOSIS — R531 Weakness: Secondary | ICD-10-CM | POA: Diagnosis not present

## 2021-03-17 NOTE — Progress Notes (Signed)
Location:   [facility] Room Number: [place] of Service:  ALF [phone]) Provider:  Veleta Miners MD  [name], MD  Patient Care Team: [name], MD as PCP - General (Internal Medicine) [name], MD as PCP - Electrophysiology (Cardiology) [place] [name], MD as Consulting Physician (Cardiology) Irine Seal, MD as Attending Physician (Urology) Clance, Armando Reichert, MD as Consulting Physician (Pulmonary Disease) Grace Isaac, MD (Inactive) as Consulting Physician (Cardiothoracic Surgery) Mauri Pole, MD as Consulting Physician (Gastroenterology)  Extended Emergency Contact Information Primary Emergency Contact: Ouzts,Francis Address: [address]          [place], [place] [address] [place] Phone: [phone] Relation: Spouse Secondary Emergency Contact: Badilla,Dorothy Address: [address]          [place], [place] [address] [place] Phone: [phone] Mobile Phone: [phone] Relation: Daughter  Code Status:  DNR Goals of care: Advanced Directive information Advanced Directives 03/17/2021  Does Patient Have a Medical Advance Directive? Yes  Type of Paramedic of [place];Living will;Out of facility DNR (pink MOST or yellow form)  Does patient want to make changes to medical advance directive? No - Patient declined  Copy of [place] in Chart? Yes - validated most recent copy scanned in chart (See row information)  Would patient like information on creating a medical advance directive? -  Pre-existing out of facility DNR order (yellow form or pink MOST form) Yellow form placed in chart (order not valid for inpatient use);Pink MOST form placed in chart (order not valid for inpatient use)     Chief Complaint  Patient presents with   Acute Visit    Weakness and fever    HPI:  Pt is a 87 y.o. female seen today for an  acute visit for Weakness and Low Grade Fever  Patient has a history of PMR, esophageal dysphagia, diastolic CHF, s/p pacemaker, anemia, hallucinations visual, osteoporosis, compression fracture, OSA  Patient has a history of PMR.  Recently she was having some worsening visual hallucination and tremor.  I had reduced her prednisone from 9 to 8 mg. Has a son who lives in the room told me that they had seen a difference in past few weeks.  Patient is struggling to get out of the car.  Feeling more weak. Pain in Proximal Muscles  Yesterday she had a low-grade temp. Her weight is stable.  Appetite is good.  She denies any cough chest pain shortness of breath dysuria.  Her mental status is at baseline. Patient has a history of off-and-on visual hallucinations they usually get worse with prednisone.  oNo  detailed work-up     Past Medical History:  Diagnosis Date   Achalasia    Anemia    Arthritis    back   Chronic steroid use    Complete heart block ([place])    S/P PACEMAKER 2000 W/ GENERATOR CHANGE 2009   Empyema, right ([place]) PULMOLOGIST-- DR CLANCE   VATS 06/23/2012 cultures negative to date CXR 07/19/12 persistent airfluid levels/  CXR 11-01-2012 IMPROVE RIGHT PLEURAL EFFUSION   GERD (gastroesophageal reflux disease)    History of aspiration pneumonitis    DEC 2013   History of hiatal hernia    Hypertension    Inguinal hernia    right   Intrinsic urethral sphincter deficiency    Megaesophagus    Mixed stress and urge  urinary incontinence    Multinodular thyroid 06/26/2012   Multi nodular goiter. Large nodules in both lobes of the gland.  These nodules fit national criteria for fine needle aspiration  biopsy if not previously assessed.     OSA on CPAP    cpap, doees not know settings   Polymyalgia rheumatica ([place])    on chronic Prednisone 45m daily   RBBB    S/P dilatation of esophageal stricture    Past Surgical History:  Procedure Laterality Date   APPENDECTOMY  1953   w/ removal  benign kidney tumor    BALLOON DILATION  07/24/2012   Procedure: BALLOON DILATION;  Surgeon: RInda Castle MD;  [facility];  Service: Endoscopy;  Laterality: N/A;   BALLOON DILATION N/A 12/03/2015   Procedure: BALLOON DILATION;  Surgeon: KMauri Pole MD;  [facility]  Service: Endoscopy;  Laterality: N/A;  pnuematic balloon   BALLOON DILATION N/A 03/16/2017   Procedure: BALLOON DILATION;  Surgeon: NMauri Pole MD;  [facility];  Service: Endoscopy;  Laterality: N/A;  PNUEMATIC BALLOONS   BOTOX INJECTION  08/07/2012   Procedure: BOTOX INJECTION;  Surgeon: RInda Castle MD;  [facility];  Service: Endoscopy;  Laterality: N/A;   BOTOX INJECTION N/A 05/24/2013   Procedure: MACROPLASTIQUE IMPLANT;  Surgeon: JIrine Seal MD;  [facility]  Service: U[facility];  Laterality: N/A;   BOTOX INJECTION  02/25/2014   Procedure: BOTOX INJECTION;  Surgeon: RInda Castle MD;  [facility];  Service: Endoscopy;;   CARDIAC PACEMAKER PLACEMENT  06/1999  DR [facility] ( LAST PACER CHECK 05-09-2013) for CHB/   END-OF-LIFE GENERATOR CHANGE  2009   CATARACT EXTRACTION W/ INTRAOCULAR LENS  IMPLANT, BILATERAL  2005   DILATION AND CURETTAGE OF UTERUS     ESOPHAGOGASTRODUODENOSCOPY  07/24/2012   Procedure: ESOPHAGOGASTRODUODENOSCOPY (EGD);  Surgeon: RInda Castle MD;  [facility];  Service: Endoscopy;  Laterality: N/A;   ESOPHAGOGASTRODUODENOSCOPY  08/07/2012   Procedure: ESOPHAGOGASTRODUODENOSCOPY (EGD);  Surgeon: RInda Castle MD;  [facility];  Service: Endoscopy;  Laterality: N/A;   ESOPHAGOGASTRODUODENOSCOPY N/A 02/25/2014   Procedure: ESOPHAGOGASTRODUODENOSCOPY (EGD);  Surgeon: RInda Castle MD;  [facility];  Service: Endoscopy;  Laterality: N/A;   ESOPHAGOGASTRODUODENOSCOPY N/A 03/16/2017   Procedure: ESOPHAGOGASTRODUODENOSCOPY (EGD);  Surgeon: NMauri Pole MD;  [facility]  Service: Endoscopy;  Laterality: N/A;   ESOPHAGOGASTRODUODENOSCOPY N/A 08/02/2018   Procedure: ESOPHAGOGASTRODUODENOSCOPY (EGD);  Surgeon: NMauri Pole MD;  [facility];  Service: Endoscopy;  Laterality: N/A;   ESOPHAGOGASTRODUODENOSCOPY (EGD) WITH ESOPHAGEAL DILATION  06/27/2012   Procedure: ESOPHAGOGASTRODUODENOSCOPY (EGD) WITH ESOPHAGEAL DILATION;  Surgeon: MLadene Artist MD,FACG;  [facility]  Service: Endoscopy;  Laterality: N/A;   ESOPHAGOGASTRODUODENOSCOPY (EGD) WITH PROPOFOL N/A 10/09/2015   Procedure: ESOPHAGOGASTRODUODENOSCOPY (EGD) WITH PROPOFOL ( WITH BOTOX);  Surgeon: DMilus Banister MD;  [facility];  Service: Endoscopy;  Laterality: N/A;   ESOPHAGOGASTRODUODENOSCOPY (EGD) WITH PROPOFOL N/A 12/03/2015   Procedure: ESOPHAGOGASTRODUODENOSCOPY (EGD) WITH PROPOFOL;  Surgeon: KMauri Pole MD;  [facility];  Service: Endoscopy;  Laterality: N/A;   FOREIGN BODY REMOVAL  08/02/2018   Procedure: FOREIGN BODY REMOVAL;  Surgeon: NMauri Pole MD;  [facility];  Service: Endoscopy;;   PACEMAKER GENERATOR CHANGE  12/13/2007   at [facility]   left 3rd toe HAMMERTOE REPAIR  TONSILLECTOMY  AS CHILD   TOTAL HIP ARTHROPLASTY Right 2001   VERICOSE VEIN LIGATION     VIDEO ASSISTED THORACOSCOPY (VATS)/DECORTICATION  06/23/2012   Procedure: VIDEO ASSISTED THORACOSCOPY (VATS)/DECORTICATION;  Surgeon: Grace Isaac, MD;  Location: [facility];  Service: Thoracic;  Laterality: Right;   VIDEO ASSISTED THORACOSCOPY (VATS)/EMPYEMA     06/23/2012   VIDEO BRONCHOSCOPY  06/23/2012   Procedure: VIDEO BRONCHOSCOPY;  Surgeon: Grace Isaac, MD;  Location: [facility];  Service: Thoracic;  Laterality: N/A;    Allergies  Allergen Reactions   Actonel [Risedronate Sodium] Other (See Comments)    Joint aches; rechallenged --caused joint aches   Ivp Dye [Iodinated Diagnostic Agents]  Nausea And Vomiting    Allergies as of 03/17/2021       Reactions   Actonel [risedronate Sodium] Other (See Comments)   Joint aches; rechallenged --caused joint aches   Ivp Dye [iodinated Diagnostic Agents] Nausea And Vomiting        Medication List        Accurate as of March 17, 2021  3:54 PM. If you have any questions, ask your nurse or doctor.          acetaminophen 500 MG tablet Commonly known as: TYLENOL Take 500 mg by mouth every 6 (six) hours as needed for mild pain or moderate pain.   CALCIUM/VITAMIN D3/ADULT GUMMY PO Take 1 tablet by mouth See admin instructions. 500 mg calcium, 1000 iu vitamin D3, Phosphorous 21m. 2 daily   cholecalciferol 25 MCG (1000 UNIT) tablet Commonly known as: VITAMIN D3 Take 1,000 Units by mouth daily.   [employer] JOINT HEALTH TRIPLE ACTION PO Take 1 tablet by mouth daily.   denosumab 60 MG/ML Sosy injection Commonly known as: PROLIA Inject 60 mg into the skin every 6 (six) months. On 1st Monday of every 6th mo.   famotidine 20 MG tablet Commonly known as: PEPCID Take 1 tablet (20 mg total) by mouth daily.   HM MULTIVITAMIN ADULT GUMMY PO Take 1 tablet by mouth daily.   OCUVITE EYE + MULTI PO Take 1 tablet by mouth daily.   NON FORMULARY Iron plus Vitamin C tablet; 680m125mg; amt: 6543m25mg; oral Once A Day   NON FORMULARY Heal and Soothe capsule; 300 mg; amt: 900 mg; oral Once A Day   polyethylene glycol 17 g packet Commonly known as: MIRALAX / GLYCOLAX Take 17 g by mouth daily.   potassium chloride 10 MEQ tablet Commonly known as: KLOR-CON Take 50 mEq by mouth daily. Dilute tablets in 5ml31m water to dissolve   PREDNISONE PO Take 8 mg by mouth daily.   torsemide 20 MG tablet Commonly known as: DEMADEX Take 20 mg by mouth daily.        Review of Systems  Constitutional: Negative.   HENT: Negative.    Respiratory: Negative.    Cardiovascular:  Positive for leg swelling.  Gastrointestinal: Negative.    Genitourinary:  Negative for dysuria.  Musculoskeletal:  Positive for gait problem and myalgias.  Skin: Negative.   Neurological:  Positive for weakness.  Psychiatric/Behavioral:  Positive for hallucinations.    Immunization History  Administered Date(s) Administered   Influenza Split 09/11/2012, 04/12/2013, 04/30/2015   Influenza, High Dose Seasonal PF 04/18/2017, 04/25/2019   Influenza,inj,Quad PF,6+ Mos 04/13/2018   Influenza-Unspecified 09/19/2012, 05/03/2014, 04/22/2016, 04/20/2017, 04/22/2020   Moderna SARS-COV2 Booster Vaccination 12/09/2020   Moderna Sars-Covid-2 Vaccination 07/16/2019, 08/13/2019, 05/20/2020   Pneumococcal Conjugate-13 04/16/2015   Pneumococcal Polysaccharide-23 07/12/2004   Td 01/10/2012  Tdap 11/05/2015   Zoster Recombinat (Shingrix) 02/07/2016   Pertinent  Health Maintenance Due  Topic Date Due   INFLUENZA VACCINE  02/09/2021   DEXA SCAN  Completed   PNA vac Low Risk Adult  Completed   Fall Risk  10/23/2019 10/10/2019 09/12/2019 08/15/2019 07/18/2019  Falls in the past year? 0 0 1 0 0  Comment - - - - -  Number falls in past yr: 0 0 1 0 0  Comment - - - - -  Injury with Fall? - - 0 - -   Functional Status Survey:    Vitals:   03/17/21 1536  BP: 118/70  Pulse: 76  Resp: (!) 26  Temp: 98.9 F (37.2 C)  SpO2: 96%  Weight: 152 lb 3.2 oz (69 kg)  Height: 5' (1.524 m)   Body mass index is 29.72 kg/m. Physical Exam Constitutional: Oriented to person, place, and time. Well-developed and well-nourished.  HENT:  Head: Normocephalic.  Mouth/Throat: Oropharynx is clear and moist.  Eyes: Pupils are equal, round, and reactive to light.  Neck: Neck supple.  Cardiovascular: Normal rate and normal heart sounds.  No murmur heard. Pulmonary/Chest: Effort normal and breath sounds normal. No respiratory distress. No wheezes. She has no rales.  Abdominal: Soft. Bowel sounds are normal. No distension. There is no tenderness. There is no rebound.   Musculoskeletal: Edema Bilateral  Lymphadenopathy: none Neurological: Alert and oriented to person, place, and time.  Able to get up from her recliner with help of her [place] to raise her Arms above shoulder Skin: Skin is warm and dry.  Psychiatric: Normal mood and affect. Behavior is normal. Thought content normal.   Labs reviewed: Recent Labs    10/04/20 0000 10/30/20 1203 03/03/21 0000  NA 143 142 146  K 4.2 4.0 3.3*  CL 104 106 106  CO2 34* 27 35*  GLUCOSE  --  127*  --   BUN 25* 18 21  CREATININE 0.7 0.74 0.8  CALCIUM 9.7 8.8* 9.2   Recent Labs    08/05/20 0000 10/04/20 0000 03/03/21 0000  AST _0 ALT _1 ALKPHOS 51 48 54  ALBUMIN 3.4* 3.5 3.3*   Recent Labs    08/05/20 0000 10/04/20 0000 10/30/20 1203 03/03/21 0000  WBC 5.7 5.7 7.8 5.9  NEUTROABS 3,329.00 3,295.00  --  3,180.00  HGB 12.5 12.7 13.6 12.3  HCT 36 38 42.9 36  MCV  --   --  106.5*  --   PLT 136* 130* 124* 109*   Lab Results  Component Value Date   TSH 0.50 03/03/2021   Lab Results  Component Value Date   HGBA1C 5.3 03/03/2021   Lab Results  Component Value Date   CHOL 146 03/03/2021   HDL 51 03/03/2021   LDLCALC 80 03/03/2021   TRIG 71 03/03/2021   CHOLHDL 3.3 02/26/2019    Significant Diagnostic Results in last 30 days:  No results found.  Assessment/Plan  Polymyalgia rheumatica (HCC) Will increase her Prednisone to 10 mg again I don't think she can tolerate lower doses then this Repeat ESR and CRP Chronic diastolic congestive heart failure (HCC) On Demadex Repeat BMP Esophageal dysphagia Tolerates Puree diet Tremor of left hand Not noticed seen recently Obstructive sleep apnea syndrome On CPAP Visual hallucination Family had before said no further work If it gets wore can consider Neurology But with her Frailty I don't think further testing will help  Age-related osteoporosis without current pathological  fracture On Prolia T Score in 2018 was  -2.9   Family/ staff Communication: Son in the room  Labs/tests ordered:   CBC,CMP and ESR ,CRP

## 2021-03-18 DIAGNOSIS — I10 Essential (primary) hypertension: Secondary | ICD-10-CM | POA: Diagnosis not present

## 2021-03-18 DIAGNOSIS — M5459 Other low back pain: Secondary | ICD-10-CM | POA: Diagnosis not present

## 2021-03-18 DIAGNOSIS — M6281 Muscle weakness (generalized): Secondary | ICD-10-CM | POA: Diagnosis not present

## 2021-03-18 DIAGNOSIS — R2681 Unsteadiness on feet: Secondary | ICD-10-CM | POA: Diagnosis not present

## 2021-03-18 DIAGNOSIS — M353 Polymyalgia rheumatica: Secondary | ICD-10-CM | POA: Diagnosis not present

## 2021-03-18 DIAGNOSIS — R531 Weakness: Secondary | ICD-10-CM | POA: Diagnosis not present

## 2021-03-18 DIAGNOSIS — Z9181 History of falling: Secondary | ICD-10-CM | POA: Diagnosis not present

## 2021-03-18 DIAGNOSIS — N3946 Mixed incontinence: Secondary | ICD-10-CM | POA: Diagnosis not present

## 2021-03-19 DIAGNOSIS — I10 Essential (primary) hypertension: Secondary | ICD-10-CM | POA: Diagnosis not present

## 2021-03-19 LAB — BASIC METABOLIC PANEL
BUN: 23 — AB (ref 4–21)
CO2: 34 — AB (ref 13–22)
Chloride: 104 (ref 99–108)
Creatinine: 0.7 (ref 0.5–1.1)
Glucose: 107
Potassium: 3.9 (ref 3.4–5.3)
Sodium: 144 (ref 137–147)

## 2021-03-19 LAB — HEPATIC FUNCTION PANEL
ALT: 7 (ref 7–35)
AST: 12 — AB (ref 13–35)
Alkaline Phosphatase: 70 (ref 25–125)
Bilirubin, Total: 0.4

## 2021-03-19 LAB — COMPREHENSIVE METABOLIC PANEL
Albumin: 3.2 — AB (ref 3.5–5.0)
Calcium: 9.4 (ref 8.7–10.7)
Globulin: 2.6

## 2021-03-19 LAB — CBC AND DIFFERENTIAL
HCT: 35 — AB (ref 36–46)
Hemoglobin: 11.6 — AB (ref 12.0–16.0)
Neutrophils Absolute: 2534
Platelets: 118 — AB (ref 150–399)
WBC: 4.8

## 2021-03-19 LAB — CBC: RBC: 3.45 — AB (ref 3.87–5.11)

## 2021-03-30 ENCOUNTER — Non-Acute Institutional Stay: Payer: Medicare Other | Admitting: Internal Medicine

## 2021-03-30 ENCOUNTER — Encounter: Payer: Self-pay | Admitting: Internal Medicine

## 2021-03-30 DIAGNOSIS — M5136 Other intervertebral disc degeneration, lumbar region: Secondary | ICD-10-CM | POA: Diagnosis not present

## 2021-03-30 DIAGNOSIS — M353 Polymyalgia rheumatica: Secondary | ICD-10-CM

## 2021-03-30 DIAGNOSIS — F4321 Adjustment disorder with depressed mood: Secondary | ICD-10-CM | POA: Diagnosis not present

## 2021-03-30 DIAGNOSIS — R531 Weakness: Secondary | ICD-10-CM | POA: Diagnosis not present

## 2021-03-30 DIAGNOSIS — R350 Frequency of micturition: Secondary | ICD-10-CM

## 2021-03-30 DIAGNOSIS — N390 Urinary tract infection, site not specified: Secondary | ICD-10-CM | POA: Diagnosis not present

## 2021-03-30 DIAGNOSIS — M545 Low back pain, unspecified: Secondary | ICD-10-CM | POA: Diagnosis not present

## 2021-03-30 LAB — C-REACTIVE PROTEIN
CRP: 22.7
Sedimentation Rate-Westergren: 41

## 2021-03-30 LAB — POTASSIUM: Potassium: 3.6

## 2021-03-30 NOTE — Progress Notes (Signed)
Location:   [place] Room Number: [place] of Service:  ALF [phone]) Provider:  Veleta Miners MD   [name], MD  Patient Care Team: [name], MD as PCP - General (Internal Medicine) [name], MD as PCP - Electrophysiology (Cardiology) [place], [facility] [name], MD as Consulting Physician (Cardiology) Irine Seal, MD as Attending Physician (Urology) Clance, Armando Reichert, MD as Consulting Physician (Pulmonary Disease) Grace Isaac, MD (Inactive) as Consulting Physician (Cardiothoracic Surgery) Mauri Pole, MD as Consulting Physician (Gastroenterology)  Extended Emergency Contact Information Primary Emergency Contact: Cessna,Francis Address: [address]          [place], [place] [address] [place] Phone: [phone] Relation: Spouse Secondary Emergency Contact: Freel,Dorothy Address: [address]          [place], [place] [address] [place] Phone: [phone] Mobile Phone: [phone] Relation: Daughter  Code Status:  DNR Goals of care: Advanced Directive information Advanced Directives 03/30/2021  Does Patient Have a Medical Advance Directive? Yes  Type of Paramedic of [place];Living will;Out of facility DNR (pink MOST or yellow form)  Does patient want to make changes to medical advance directive? No - Patient declined  Copy of [place] in Chart? Yes - validated most recent copy scanned in chart (See row information)  Would patient like information on creating a medical advance directive? -  Pre-existing out of facility DNR order (yellow form or pink MOST form) Yellow form placed in chart (order not valid for inpatient use);Pink MOST form placed in chart (order not valid for inpatient use)     Chief Complaint  Patient presents with   Acute Visit    HPI:  Pt is a 87 y.o. female seen today for an acute visit for  Feeling weak Lower Back pain and Unable to do ADLS without Assist  Patient has a history of PMR, esophageal dysphagia, diastolic CHF, s/p pacemaker, anemia, hallucinations visual, osteoporosis, compression fracture, OSA  Patient lost her Husband few days ago Nurses have noticed patient to be very Tired. Unable to do her ADLS. Needing help Also having Urinary Frequency NO Dysuria or Hematuria No Fever No Cough or SOB Also c/o Pain in her Lower Back.Recent Worsening Has not had any falls or Injury Did had to sit down on the Floor once when trying to do her ADLS Mental status at baseline Denies Depression   Past Medical History:  Diagnosis Date   Achalasia    Anemia    Arthritis    back   Chronic steroid use    Complete heart block ([place])    S/P PACEMAKER 2000 W/ GENERATOR CHANGE 2009   Empyema, right ([place]) PULMOLOGIST-- DR CLANCE   VATS 06/23/2012 cultures negative to date CXR 07/19/12 persistent airfluid levels/  CXR 11-01-2012 IMPROVE RIGHT PLEURAL EFFUSION   GERD (gastroesophageal reflux disease)    History of aspiration pneumonitis    DEC 2013   History of hiatal hernia    Hypertension    Inguinal hernia    right   Intrinsic urethral sphincter deficiency    Megaesophagus    Mixed stress and urge urinary incontinence    Multinodular thyroid 06/26/2012   Multi nodular goiter. Large nodules in both lobes of the gland.  These nodules fit national criteria for fine needle aspiration  biopsy if not previously assessed.     OSA on CPAP  cpap, doees not know settings   Polymyalgia rheumatica ([place])    on chronic Prednisone 66m daily   RBBB    S/P dilatation of esophageal stricture    Past Surgical History:  Procedure Laterality Date   APPENDECTOMY  1953   w/ removal benign kidney tumor    BALLOON DILATION  07/24/2012   Procedure: BALLOON DILATION;  Surgeon: RInda Castle MD;  [facility];  Service: Endoscopy;  Laterality: N/A;   BALLOON DILATION N/A 12/03/2015    Procedure: BALLOON DILATION;  Surgeon: KMauri Pole MD;  [facility]  Service: Endoscopy;  Laterality: N/A;  pnuematic balloon   BALLOON DILATION N/A 03/16/2017   Procedure: BALLOON DILATION;  Surgeon: NMauri Pole MD;  [facility];  Service: Endoscopy;  Laterality: N/A;  PNUEMATIC BALLOONS   BOTOX INJECTION  08/07/2012   Procedure: BOTOX INJECTION;  Surgeon: RInda Castle MD;  [facility];  Service: Endoscopy;  Laterality: N/A;   BOTOX INJECTION N/A 05/24/2013   Procedure: MACROPLASTIQUE IMPLANT;  Surgeon: JIrine Seal MD;  [facility]  Service: Urology;  Laterality: N/A;   BOTOX INJECTION  02/25/2014   Procedure: BOTOX INJECTION;  Surgeon: RInda Castle MD;  [facility];  Service: Endoscopy;;   CARDIAC PACEMAKER PLACEMENT  06/1999  [facility] ( LAST PACER CHECK 05-09-2013) for CHB/   END-OF-LIFE GENERATOR CHANGE  2009   CATARACT EXTRACTION W/ INTRAOCULAR LENS  IMPLANT, BILATERAL  2005   DILATION AND CURETTAGE OF UTERUS     ESOPHAGOGASTRODUODENOSCOPY  07/24/2012   Procedure: ESOPHAGOGASTRODUODENOSCOPY (EGD);  Surgeon: RInda Castle MD;  [facility];  Service: Endoscopy;  Laterality: N/A;   ESOPHAGOGASTRODUODENOSCOPY  08/07/2012   Procedure: ESOPHAGOGASTRODUODENOSCOPY (EGD);  Surgeon: RInda Castle MD;  [facility];  Service: Endoscopy;  Laterality: N/A;   ESOPHAGOGASTRODUODENOSCOPY N/A 02/25/2014   Procedure: ESOPHAGOGASTRODUODENOSCOPY (EGD);  Surgeon: RInda Castle MD;  [facility];  Service: Endoscopy;  Laterality: N/A;   ESOPHAGOGASTRODUODENOSCOPY N/A 03/16/2017   Procedure: ESOPHAGOGASTRODUODENOSCOPY (EGD);  Surgeon: NMauri Pole MD;  [facility]  Service: Endoscopy;  Laterality: N/A;   ESOPHAGOGASTRODUODENOSCOPY N/A 08/02/2018   Procedure: ESOPHAGOGASTRODUODENOSCOPY (EGD);  Surgeon: NMauri Pole MD;   [facility];  Service: Endoscopy;  Laterality: N/A;   ESOPHAGOGASTRODUODENOSCOPY (EGD) WITH ESOPHAGEAL DILATION  06/27/2012   Procedure: ESOPHAGOGASTRODUODENOSCOPY (EGD) WITH ESOPHAGEAL DILATION;  Surgeon: MLadene Artist MD,FACG;  [facility]  Service: Endoscopy;  Laterality: N/A;   ESOPHAGOGASTRODUODENOSCOPY (EGD) WITH PROPOFOL N/A 10/09/2015   Procedure: ESOPHAGOGASTRODUODENOSCOPY (EGD) WITH PROPOFOL ( WITH BOTOX);  Surgeon: DMilus Banister MD;  [facility];  Service: Endoscopy;  Laterality: N/A;   ESOPHAGOGASTRODUODENOSCOPY (EGD) WITH PROPOFOL N/A 12/03/2015   Procedure: ESOPHAGOGASTRODUODENOSCOPY (EGD) WITH PROPOFOL;  Surgeon: KMauri Pole MD;  [facility];  Service: Endoscopy;  Laterality: N/A;   FOREIGN BODY REMOVAL  08/02/2018   Procedure: FOREIGN BODY REMOVAL;  Surgeon: NMauri Pole MD;  [facility];  Service: Endoscopy;;   PACEMAKER GENERATOR CHANGE  12/13/2007   at [facility] 2013   left 3rd toe HAMMERTOE REPAIR   TONSILLECTOMY  AS CHILD   TOTAL HIP ARTHROPLASTY Right 2001   VERICOSE VEIN LIGATION     VIDEO ASSISTED THORACOSCOPY (VATS)/DECORTICATION  06/23/2012   Procedure: VIDEO ASSISTED THORACOSCOPY (VATS)/DECORTICATION;  Surgeon: EGrace Isaac MD;  [facility]  Service: Thoracic;  Laterality: Right;   VIDEO ASSISTED THORACOSCOPY (VATS)/EMPYEMA     06/23/2012   VIDEO BRONCHOSCOPY  06/23/2012   Procedure: VIDEO BRONCHOSCOPY;  Surgeon: Grace Isaac, MD;  [facility];  Service: Thoracic;  Laterality: N/A;    Allergies  Allergen Reactions   Actonel [Risedronate Sodium] Other (See Comments)    Joint aches; rechallenged --caused joint aches   Ivp Dye [Iodinated Diagnostic Agents] Nausea And Vomiting    Allergies as of 03/30/2021       Reactions   Actonel [risedronate Sodium] Other (See Comments)   Joint aches; rechallenged --caused joint aches   Ivp Dye [iodinated Diagnostic  Agents] Nausea And Vomiting        Medication List        Accurate as of March 30, 2021 10:22 AM. If you have any questions, ask your nurse or doctor.          acetaminophen 500 MG tablet Commonly known as: TYLENOL Take 500 mg by mouth every 6 (six) hours as needed for mild pain or moderate pain.   CALCIUM/VITAMIN D3/ADULT GUMMY PO Take 1 tablet by mouth See admin instructions. 500 mg calcium, 1000 iu vitamin D3, Phosphorous 245m. 2 daily   cholecalciferol 25 MCG (1000 UNIT) tablet Commonly known as: VITAMIN D3 Take 1,000 Units by mouth daily.   [employer] JOINT HEALTH TRIPLE ACTION PO Take 1 tablet by mouth daily.   denosumab 60 MG/ML Sosy injection Commonly known as: PROLIA Inject 60 mg into the skin every 6 (six) months. On 1st Monday of every 6th mo.   famotidine 20 MG tablet Commonly known as: PEPCID Take 1 tablet (20 mg total) by mouth daily.   HM MULTIVITAMIN ADULT GUMMY PO Take 1 tablet by mouth daily.   OCUVITE EYE + MULTI PO Take 1 tablet by mouth daily.   NON FORMULARY Iron plus Vitamin C tablet; 644m125mg; amt: 6555m25mg; oral Once A Day   NON FORMULARY Heal and Soothe capsule; 300 mg; amt: 900 mg; oral Once A Day   polyethylene glycol 17 g packet Commonly known as: MIRALAX / GLYCOLAX Take 17 g by mouth daily.   potassium chloride 10 MEQ tablet Commonly known as: KLOR-CON Take 50 mEq by mouth daily. Dilute tablets in 5ml72m water to dissolve   PREDNISONE PO Take 10 mg by mouth daily.   torsemide 20 MG tablet Commonly known as: DEMADEX Take 20 mg by mouth daily.        Review of Systems  Constitutional:  Positive for activity change and appetite change.  HENT: Negative.    Respiratory: Negative.    Cardiovascular:  Positive for leg swelling.  Gastrointestinal: Negative.   Genitourinary:  Positive for flank pain and frequency.  Musculoskeletal:  Positive for back pain, gait problem and myalgias.  Skin: Negative.   Neurological:   Positive for weakness.  Psychiatric/Behavioral:  Positive for dysphoric mood.    Immunization History  Administered Date(s) Administered   Influenza Split 09/11/2012, 04/12/2013, 04/30/2015   Influenza, High Dose Seasonal PF 04/18/2017, 04/25/2019   Influenza,inj,Quad PF,6+ Mos 04/13/2018   Influenza-Unspecified 09/19/2012, 05/03/2014, 04/22/2016, 04/20/2017, 04/22/2020   Moderna SARS-COV2 Booster Vaccination 12/09/2020   Moderna Sars-Covid-2 Vaccination 07/16/2019, 08/13/2019, 05/20/2020   Pneumococcal Conjugate-13 04/16/2015   Pneumococcal Polysaccharide-23 07/12/2004   Td 01/10/2012   Tdap 11/05/2015   Zoster Recombinat (Shingrix) 02/07/2016   Pertinent  Health Maintenance Due  Topic Date Due   INFLUENZA VACCINE  02/09/2021   DEXA SCAN  Completed   Fall  Risk  10/23/2019 10/10/2019 09/12/2019 08/15/2019 07/18/2019  Falls in the past year? 0 0 1 0 0  Comment - - - - -  Number falls in past yr: 0 0 1 0 0  Comment - - - - -  Injury with Fall? - - 0 - -   Functional Status Survey:    Vitals:   03/30/21 1002  BP: 126/66  Pulse: 74  Resp: 20  Temp: 98.7 F (37.1 C)  SpO2: 95%  Weight: 152 lb 3.2 oz (69 kg)  Height: 5' (1.524 m)   Body mass index is 29.72 kg/m. Physical Exam Vitals reviewed.  Constitutional:      Appearance: Normal appearance.  HENT:     Head: Normocephalic.     Mouth/Throat:     Mouth: Mucous membranes are moist.     Pharynx: Oropharynx is clear.  Eyes:     Pupils: Pupils are equal, round, and reactive to light.  Cardiovascular:     Rate and Rhythm: Normal rate and regular rhythm.     Pulses: Normal pulses.     Heart sounds: Murmur heard.  Pulmonary:     Effort: Pulmonary effort is normal. No respiratory distress.     Breath sounds: Normal breath sounds.  Abdominal:     General: Abdomen is flat. Bowel sounds are normal.     Palpations: Abdomen is soft.  Musculoskeletal:        General: Swelling present.     Cervical back: Neck supple.      Comments: Pain in Lower Back around Paraspinal area  Skin:    General: Skin is warm.  Neurological:     General: No focal deficit present.     Mental Status: She is alert and oriented to person, place, and time.  Psychiatric:        Mood and Affect: Mood normal.        Thought Content: Thought content normal.    Labs reviewed: Recent Labs    10/30/20 1203 03/03/21 0000 03/19/21 0000  NA 142 146 144  K 4.0 3.3* 3.9  3.6  CL 106 106 104  CO2 27 35* 34*  GLUCOSE 127*  --   --   BUN 18 21 23*  CREATININE 0.74 0.8 0.7  CALCIUM 8.8* 9.2 9.4   Recent Labs    10/04/20 0000 03/03/21 0000 03/19/21 0000  AST 13 13 12*  ALT '9 10 7  ' ALKPHOS 48 54 70  ALBUMIN 3.5 3.3* 3.2*   Recent Labs    10/04/20 0000 10/30/20 1203 03/03/21 0000 03/19/21 0000  WBC 5.7 7.8 5.9 4.8  NEUTROABS 3,295.00  --  3,180.00 2,534.00  HGB 12.7 13.6 12.3 11.6*  HCT 38 42.9 36 35*  MCV  --  106.5*  --   --   PLT 130* 124* 109* 118*   Lab Results  Component Value Date   TSH 0.50 03/03/2021   Lab Results  Component Value Date   HGBA1C 5.3 03/03/2021   Lab Results  Component Value Date   CHOL 146 03/03/2021   HDL 51 03/03/2021   LDLCALC 80 03/03/2021   TRIG 71 03/03/2021   CHOLHDL 3.3 02/26/2019    Significant Diagnostic Results in last 30 days:  No results found.  Assessment/Plan Acute bilateral low back pain without sciatica Xray to rule out Fracture Heat Patches for Pain Tylenol 650 mg TID  Increased urinary frequency Check UA and Culture  Polymyalgia rheumatica (HCC) CRP done few weeks ago was 22  ESR was 41 Prednisone increased from 8 to 10 mg Will repeat CRP in few weeks  Feeling grief Recent loss of Husband of 17 years She says she doe snot need anything right now Weakness D/W the therapy to consider therapy Patient otherwise have to move to SNF for higher level of care Stays risk for fall  Other issues  Chronic diastolic congestive heart failure (HCC) On  Demadex  Esophageal dysphagia Tolerates Puree diet Tremor of left hand Not noticed seen recently Obstructive sleep apnea syndrome On CPAP Visual hallucination Family had before said no further work If it gets wore can consider Neurology But with her Frailty I don't think further testing will help   Age-related osteoporosis without current pathological fracture On Prolia T Score in 2018 was -2.9     Family/ staff Communication:   Labs/tests ordered:

## 2021-03-31 DIAGNOSIS — Z23 Encounter for immunization: Secondary | ICD-10-CM | POA: Diagnosis not present

## 2021-04-01 ENCOUNTER — Telehealth: Payer: Self-pay | Admitting: *Deleted

## 2021-04-01 NOTE — Telephone Encounter (Signed)
Patient called and left message on clinical intake requesting her blood results and Urine Results she had done 03/30/2021 ([facility]).  Stated that they haven't heard anything.   Called and spoke with patient's son and he stated that they have already spoke with the nurse at the facility and she gave them the results.

## 2021-04-09 DIAGNOSIS — R2681 Unsteadiness on feet: Secondary | ICD-10-CM | POA: Diagnosis not present

## 2021-04-09 DIAGNOSIS — N3946 Mixed incontinence: Secondary | ICD-10-CM | POA: Diagnosis not present

## 2021-04-09 DIAGNOSIS — M6281 Muscle weakness (generalized): Secondary | ICD-10-CM | POA: Diagnosis not present

## 2021-04-09 DIAGNOSIS — Z9181 History of falling: Secondary | ICD-10-CM | POA: Diagnosis not present

## 2021-04-09 DIAGNOSIS — R531 Weakness: Secondary | ICD-10-CM | POA: Diagnosis not present

## 2021-04-09 DIAGNOSIS — M5459 Other low back pain: Secondary | ICD-10-CM | POA: Diagnosis not present

## 2021-04-14 DIAGNOSIS — M5459 Other low back pain: Secondary | ICD-10-CM | POA: Diagnosis not present

## 2021-04-14 DIAGNOSIS — R41841 Cognitive communication deficit: Secondary | ICD-10-CM | POA: Diagnosis not present

## 2021-04-14 DIAGNOSIS — M6281 Muscle weakness (generalized): Secondary | ICD-10-CM | POA: Diagnosis not present

## 2021-04-14 DIAGNOSIS — N3946 Mixed incontinence: Secondary | ICD-10-CM | POA: Diagnosis not present

## 2021-04-14 DIAGNOSIS — Z9181 History of falling: Secondary | ICD-10-CM | POA: Diagnosis not present

## 2021-04-14 DIAGNOSIS — R29898 Other symptoms and signs involving the musculoskeletal system: Secondary | ICD-10-CM | POA: Diagnosis not present

## 2021-04-14 DIAGNOSIS — R2681 Unsteadiness on feet: Secondary | ICD-10-CM | POA: Diagnosis not present

## 2021-04-15 DIAGNOSIS — R41841 Cognitive communication deficit: Secondary | ICD-10-CM | POA: Diagnosis not present

## 2021-04-15 DIAGNOSIS — R2681 Unsteadiness on feet: Secondary | ICD-10-CM | POA: Diagnosis not present

## 2021-04-15 DIAGNOSIS — M6281 Muscle weakness (generalized): Secondary | ICD-10-CM | POA: Diagnosis not present

## 2021-04-15 DIAGNOSIS — M5459 Other low back pain: Secondary | ICD-10-CM | POA: Diagnosis not present

## 2021-04-15 DIAGNOSIS — Z9181 History of falling: Secondary | ICD-10-CM | POA: Diagnosis not present

## 2021-04-15 DIAGNOSIS — N3946 Mixed incontinence: Secondary | ICD-10-CM | POA: Diagnosis not present

## 2021-04-16 DIAGNOSIS — M6281 Muscle weakness (generalized): Secondary | ICD-10-CM | POA: Diagnosis not present

## 2021-04-16 DIAGNOSIS — Z9181 History of falling: Secondary | ICD-10-CM | POA: Diagnosis not present

## 2021-04-16 DIAGNOSIS — R2681 Unsteadiness on feet: Secondary | ICD-10-CM | POA: Diagnosis not present

## 2021-04-16 DIAGNOSIS — R41841 Cognitive communication deficit: Secondary | ICD-10-CM | POA: Diagnosis not present

## 2021-04-16 DIAGNOSIS — N3946 Mixed incontinence: Secondary | ICD-10-CM | POA: Diagnosis not present

## 2021-04-16 DIAGNOSIS — M5459 Other low back pain: Secondary | ICD-10-CM | POA: Diagnosis not present

## 2021-04-17 ENCOUNTER — Encounter: Payer: Self-pay | Admitting: Nurse Practitioner

## 2021-04-17 ENCOUNTER — Non-Acute Institutional Stay: Payer: Medicare Other | Admitting: Nurse Practitioner

## 2021-04-17 DIAGNOSIS — G8929 Other chronic pain: Secondary | ICD-10-CM | POA: Diagnosis not present

## 2021-04-17 DIAGNOSIS — K5901 Slow transit constipation: Secondary | ICD-10-CM | POA: Diagnosis not present

## 2021-04-17 DIAGNOSIS — R41841 Cognitive communication deficit: Secondary | ICD-10-CM | POA: Diagnosis not present

## 2021-04-17 DIAGNOSIS — M353 Polymyalgia rheumatica: Secondary | ICD-10-CM | POA: Diagnosis not present

## 2021-04-17 DIAGNOSIS — M6281 Muscle weakness (generalized): Secondary | ICD-10-CM | POA: Diagnosis not present

## 2021-04-17 DIAGNOSIS — N3946 Mixed incontinence: Secondary | ICD-10-CM | POA: Diagnosis not present

## 2021-04-17 DIAGNOSIS — M5459 Other low back pain: Secondary | ICD-10-CM | POA: Diagnosis not present

## 2021-04-17 DIAGNOSIS — M545 Low back pain, unspecified: Secondary | ICD-10-CM | POA: Diagnosis not present

## 2021-04-17 DIAGNOSIS — M810 Age-related osteoporosis without current pathological fracture: Secondary | ICD-10-CM | POA: Diagnosis not present

## 2021-04-17 DIAGNOSIS — R441 Visual hallucinations: Secondary | ICD-10-CM | POA: Diagnosis not present

## 2021-04-17 DIAGNOSIS — Z9181 History of falling: Secondary | ICD-10-CM | POA: Diagnosis not present

## 2021-04-17 DIAGNOSIS — K220 Achalasia of cardia: Secondary | ICD-10-CM

## 2021-04-17 DIAGNOSIS — I5032 Chronic diastolic (congestive) heart failure: Secondary | ICD-10-CM | POA: Diagnosis not present

## 2021-04-17 DIAGNOSIS — G4733 Obstructive sleep apnea (adult) (pediatric): Secondary | ICD-10-CM | POA: Diagnosis not present

## 2021-04-17 DIAGNOSIS — R2681 Unsteadiness on feet: Secondary | ICD-10-CM | POA: Diagnosis not present

## 2021-04-17 NOTE — Assessment & Plan Note (Signed)
CHF, minimal edema BLE, BNP 122 in the past, echocardiogram  EF 64-31%, grade I diastolic dysfunction.  CXR bilateral effusions, on Torsemide. F/u cardiology. Bun/creat 23/0.7 03/19/21
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
## 2021-04-17 NOTE — Assessment & Plan Note (Signed)
s/p dilation, on Pepcid, Pureed diet

## 2021-04-17 NOTE — Assessment & Plan Note (Signed)
CPAP/O2 at night, underwent sleep study.

## 2021-04-17 NOTE — Assessment & Plan Note (Signed)
Bilateral lower back pain, mostly in sacral region, positional/movement associated, Tylenol is not adequate for pain control, started 1/2 Norco 5/325mg  for 2 days for trial, the patient stated it helped some.

## 2021-04-17 NOTE — Assessment & Plan Note (Signed)
Stable, continue MiraLax.  °

## 2021-04-17 NOTE — Assessment & Plan Note (Addendum)
T score -2.9 2018, takes Prolia.

## 2021-04-17 NOTE — Assessment & Plan Note (Signed)
Hx of PMR saw rheumatology, on Prednisone 10mg  qd
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
## 2021-04-20 ENCOUNTER — Encounter: Payer: Self-pay | Admitting: Nurse Practitioner

## 2021-04-20 DIAGNOSIS — R2681 Unsteadiness on feet: Secondary | ICD-10-CM | POA: Diagnosis not present

## 2021-04-20 DIAGNOSIS — Z9181 History of falling: Secondary | ICD-10-CM | POA: Diagnosis not present

## 2021-04-20 DIAGNOSIS — R41841 Cognitive communication deficit: Secondary | ICD-10-CM | POA: Diagnosis not present

## 2021-04-20 DIAGNOSIS — N3946 Mixed incontinence: Secondary | ICD-10-CM | POA: Diagnosis not present

## 2021-04-20 DIAGNOSIS — M5459 Other low back pain: Secondary | ICD-10-CM | POA: Diagnosis not present

## 2021-04-20 DIAGNOSIS — M6281 Muscle weakness (generalized): Secondary | ICD-10-CM | POA: Diagnosis not present

## 2021-04-22 ENCOUNTER — Telehealth: Payer: Self-pay | Admitting: *Deleted

## 2021-04-22 NOTE — Telephone Encounter (Signed)
Patient daughter, Magdalen Spatz, called requesting to speak with Dr. Lyndel Safe regarding status of patient's Health.   Daughter stated that patient saw [facility] last but requesting to speak with Dr. Lyndel Safe directly.  Daughter feels like her mother has Parkinsonism and wonders if she took medication it would help some of the symptoms patient is having.  Patient is having Hallucinations, tremors, Nightmares. Daughter feels that it is more Neuro going on.   Stated that when patient saw [facility] she asked if it was bothering her and patient stated no because she doesn't want to put anybody out but stated that it is bothering her.   Daughter is requesting to speak with Dr. Lyndel Safe personally to get her opinion.  Wants Dr. Lyndel Safe to call her at: [phone]

## 2021-04-23 DIAGNOSIS — M5459 Other low back pain: Secondary | ICD-10-CM | POA: Diagnosis not present

## 2021-04-23 DIAGNOSIS — Z9181 History of falling: Secondary | ICD-10-CM | POA: Diagnosis not present

## 2021-04-23 DIAGNOSIS — M6281 Muscle weakness (generalized): Secondary | ICD-10-CM | POA: Diagnosis not present

## 2021-04-23 DIAGNOSIS — R2681 Unsteadiness on feet: Secondary | ICD-10-CM | POA: Diagnosis not present

## 2021-04-23 DIAGNOSIS — N3946 Mixed incontinence: Secondary | ICD-10-CM | POA: Diagnosis not present

## 2021-04-23 DIAGNOSIS — R41841 Cognitive communication deficit: Secondary | ICD-10-CM | POA: Diagnosis not present

## 2021-04-24 DIAGNOSIS — M6281 Muscle weakness (generalized): Secondary | ICD-10-CM | POA: Diagnosis not present

## 2021-04-24 DIAGNOSIS — R41841 Cognitive communication deficit: Secondary | ICD-10-CM | POA: Diagnosis not present

## 2021-04-24 DIAGNOSIS — M5459 Other low back pain: Secondary | ICD-10-CM | POA: Diagnosis not present

## 2021-04-24 DIAGNOSIS — N3946 Mixed incontinence: Secondary | ICD-10-CM | POA: Diagnosis not present

## 2021-04-24 DIAGNOSIS — R2681 Unsteadiness on feet: Secondary | ICD-10-CM | POA: Diagnosis not present

## 2021-04-24 DIAGNOSIS — Z9181 History of falling: Secondary | ICD-10-CM | POA: Diagnosis not present

## 2021-04-27 DIAGNOSIS — M5459 Other low back pain: Secondary | ICD-10-CM | POA: Diagnosis not present

## 2021-04-27 DIAGNOSIS — M6281 Muscle weakness (generalized): Secondary | ICD-10-CM | POA: Diagnosis not present

## 2021-04-27 DIAGNOSIS — Z9181 History of falling: Secondary | ICD-10-CM | POA: Diagnosis not present

## 2021-04-27 DIAGNOSIS — R41841 Cognitive communication deficit: Secondary | ICD-10-CM | POA: Diagnosis not present

## 2021-04-27 DIAGNOSIS — N3946 Mixed incontinence: Secondary | ICD-10-CM | POA: Diagnosis not present

## 2021-04-27 DIAGNOSIS — R2681 Unsteadiness on feet: Secondary | ICD-10-CM | POA: Diagnosis not present

## 2021-04-30 DIAGNOSIS — M6281 Muscle weakness (generalized): Secondary | ICD-10-CM | POA: Diagnosis not present

## 2021-04-30 DIAGNOSIS — R2681 Unsteadiness on feet: Secondary | ICD-10-CM | POA: Diagnosis not present

## 2021-04-30 DIAGNOSIS — M5459 Other low back pain: Secondary | ICD-10-CM | POA: Diagnosis not present

## 2021-04-30 DIAGNOSIS — R41841 Cognitive communication deficit: Secondary | ICD-10-CM | POA: Diagnosis not present

## 2021-04-30 DIAGNOSIS — Z9181 History of falling: Secondary | ICD-10-CM | POA: Diagnosis not present

## 2021-04-30 DIAGNOSIS — N3946 Mixed incontinence: Secondary | ICD-10-CM | POA: Diagnosis not present

## 2021-05-01 DIAGNOSIS — M5459 Other low back pain: Secondary | ICD-10-CM | POA: Diagnosis not present

## 2021-05-01 DIAGNOSIS — Z9181 History of falling: Secondary | ICD-10-CM | POA: Diagnosis not present

## 2021-05-01 DIAGNOSIS — M6281 Muscle weakness (generalized): Secondary | ICD-10-CM | POA: Diagnosis not present

## 2021-05-01 DIAGNOSIS — N3946 Mixed incontinence: Secondary | ICD-10-CM | POA: Diagnosis not present

## 2021-05-01 DIAGNOSIS — R41841 Cognitive communication deficit: Secondary | ICD-10-CM | POA: Diagnosis not present

## 2021-05-01 DIAGNOSIS — R2681 Unsteadiness on feet: Secondary | ICD-10-CM | POA: Diagnosis not present

## 2021-05-04 DIAGNOSIS — M5459 Other low back pain: Secondary | ICD-10-CM | POA: Diagnosis not present

## 2021-05-04 DIAGNOSIS — M6281 Muscle weakness (generalized): Secondary | ICD-10-CM | POA: Diagnosis not present

## 2021-05-04 DIAGNOSIS — R2681 Unsteadiness on feet: Secondary | ICD-10-CM | POA: Diagnosis not present

## 2021-05-04 DIAGNOSIS — R41841 Cognitive communication deficit: Secondary | ICD-10-CM | POA: Diagnosis not present

## 2021-05-04 DIAGNOSIS — Z9181 History of falling: Secondary | ICD-10-CM | POA: Diagnosis not present

## 2021-05-04 DIAGNOSIS — N3946 Mixed incontinence: Secondary | ICD-10-CM | POA: Diagnosis not present

## 2021-05-06 DIAGNOSIS — R41841 Cognitive communication deficit: Secondary | ICD-10-CM | POA: Diagnosis not present

## 2021-05-06 DIAGNOSIS — M6281 Muscle weakness (generalized): Secondary | ICD-10-CM | POA: Diagnosis not present

## 2021-05-06 DIAGNOSIS — R2681 Unsteadiness on feet: Secondary | ICD-10-CM | POA: Diagnosis not present

## 2021-05-06 DIAGNOSIS — N3946 Mixed incontinence: Secondary | ICD-10-CM | POA: Diagnosis not present

## 2021-05-06 DIAGNOSIS — Z9181 History of falling: Secondary | ICD-10-CM | POA: Diagnosis not present

## 2021-05-06 DIAGNOSIS — M5459 Other low back pain: Secondary | ICD-10-CM | POA: Diagnosis not present

## 2021-05-07 DIAGNOSIS — R41841 Cognitive communication deficit: Secondary | ICD-10-CM | POA: Diagnosis not present

## 2021-05-07 DIAGNOSIS — Z9181 History of falling: Secondary | ICD-10-CM | POA: Diagnosis not present

## 2021-05-07 DIAGNOSIS — M5459 Other low back pain: Secondary | ICD-10-CM | POA: Diagnosis not present

## 2021-05-07 DIAGNOSIS — N3946 Mixed incontinence: Secondary | ICD-10-CM | POA: Diagnosis not present

## 2021-05-07 DIAGNOSIS — R2681 Unsteadiness on feet: Secondary | ICD-10-CM | POA: Diagnosis not present

## 2021-05-07 DIAGNOSIS — M6281 Muscle weakness (generalized): Secondary | ICD-10-CM | POA: Diagnosis not present

## 2021-05-08 DIAGNOSIS — R41841 Cognitive communication deficit: Secondary | ICD-10-CM | POA: Diagnosis not present

## 2021-05-08 DIAGNOSIS — Z9181 History of falling: Secondary | ICD-10-CM | POA: Diagnosis not present

## 2021-05-08 DIAGNOSIS — R2681 Unsteadiness on feet: Secondary | ICD-10-CM | POA: Diagnosis not present

## 2021-05-08 DIAGNOSIS — M5459 Other low back pain: Secondary | ICD-10-CM | POA: Diagnosis not present

## 2021-05-08 DIAGNOSIS — N3946 Mixed incontinence: Secondary | ICD-10-CM | POA: Diagnosis not present

## 2021-05-08 DIAGNOSIS — M6281 Muscle weakness (generalized): Secondary | ICD-10-CM | POA: Diagnosis not present

## 2021-05-11 ENCOUNTER — Other Ambulatory Visit: Payer: Self-pay

## 2021-05-11 ENCOUNTER — Ambulatory Visit (INDEPENDENT_AMBULATORY_CARE_PROVIDER_SITE_OTHER): Payer: Medicare Other | Admitting: Internal Medicine

## 2021-05-11 ENCOUNTER — Encounter: Payer: Self-pay | Admitting: Internal Medicine

## 2021-05-11 VITALS — BP 112/64 | HR 87 | Ht 60.0 in | Wt 152.5 lb

## 2021-05-11 DIAGNOSIS — Z950 Presence of cardiac pacemaker: Secondary | ICD-10-CM | POA: Diagnosis not present

## 2021-05-11 DIAGNOSIS — Z01812 Encounter for preprocedural laboratory examination: Secondary | ICD-10-CM

## 2021-05-11 DIAGNOSIS — Z01818 Encounter for other preprocedural examination: Secondary | ICD-10-CM

## 2021-05-11 DIAGNOSIS — I4729 Other ventricular tachycardia: Secondary | ICD-10-CM

## 2021-05-11 DIAGNOSIS — I495 Sick sinus syndrome: Secondary | ICD-10-CM | POA: Diagnosis not present

## 2021-05-11 DIAGNOSIS — I472 Ventricular tachycardia, unspecified: Secondary | ICD-10-CM

## 2021-05-11 DIAGNOSIS — Z9181 History of falling: Secondary | ICD-10-CM | POA: Diagnosis not present

## 2021-05-11 DIAGNOSIS — M6281 Muscle weakness (generalized): Secondary | ICD-10-CM | POA: Diagnosis not present

## 2021-05-11 DIAGNOSIS — R41841 Cognitive communication deficit: Secondary | ICD-10-CM | POA: Diagnosis not present

## 2021-05-11 DIAGNOSIS — R2681 Unsteadiness on feet: Secondary | ICD-10-CM | POA: Diagnosis not present

## 2021-05-11 DIAGNOSIS — M5459 Other low back pain: Secondary | ICD-10-CM | POA: Diagnosis not present

## 2021-05-11 DIAGNOSIS — N3946 Mixed incontinence: Secondary | ICD-10-CM | POA: Diagnosis not present

## 2021-05-11 NOTE — Patient Instructions (Signed)
Medication Instructions: ` Your physician recommends that you continue on your current medications as directed. Please refer to the Current Medication list given to you today.  *If you need a refill on your cardiac medications before your next appointment, please call your pharmacy*   Lab Work: CBC and BMET today If you have labs (blood work) drawn today and your tests are completely normal, you will receive your results only by: [place] (if you have MyChart) OR A paper copy in the mail If you have any lab test that is abnormal or we need to change your treatment, we will call you to review the results.   Testing/Procedures: Pacemaker Generator Change  Your physician has recommended that you have a pacemaker inserted. A pacemaker is a small device that is placed under the skin of your chest or abdomen to help control abnormal heart rhythms. This device uses electrical pulses to prompt the heart to beat at a normal rate. Pacemakers are used to treat heart rhythms that are too slow. Wire (leads) are attached to the pacemaker that goes into the chambers of you heart. This is done in the hospital and usually requires and overnight stay. Please see the instruction sheet given to you today for more information.    Follow-Up: At [facility], you and your health needs are our priority.  As part of our continuing mission to provide you with exceptional heart care, we have created designated Provider Care Teams.  These Care Teams include your primary Cardiologist (physician) and Advanced Practice Providers (APPs -  Physician Assistants and Nurse Practitioners) who all work together to provide you with the care you need, when you need it.  We recommend signing up for the patient portal called "MyChart".  Sign up information is provided on this After Visit Summary.  MyChart is used to connect with patients for Virtual Visits (Telemedicine).  Patients are able to view lab/test results, encounter  notes, upcoming appointments, etc.  Non-urgent messages can be sent to your provider as well.   To learn more about what you can do with MyChart, go to [email or web address].    Your next appointment:   To be scheduled

## 2021-05-11 NOTE — Progress Notes (Signed)
Patient Care Team: Jill Dad, MD as PCP - General (Internal Medicine) Jill Sprang, MD as PCP - Electrophysiology (Cardiology) [name], [name] Jill Sprang, MD as Consulting Physician (Cardiology) Jill Seal, MD as Attending Physician (Urology) [name], Jill Reichert, MD as Consulting Physician (Pulmonary Disease) Jill Isaac, MD (Inactive) as Consulting Physician (Cardiothoracic Surgery) Jill Pole, MD as Consulting Physician (Gastroenterology)   HPI  [name] is a 87 y.o. female Seen in followup for pacemaker Medtronic inserted for complete heart block and prior syncope. She underwent pacemaker implantation originally 06/1999 and underwent device generator replacement 5/09.  It is Medtronic device with 5076 leads.  She has hx of achalasia; treated with botox with some improvement;   Hx of Hypotension asymptomatic   Today, she is doing fine. She experiences back pain that limits her movement. She also experiences shoulder and hip pain due to polymyalgia rheumatica. She takes steroids to manage her polymyalgia rheumatica. However, she has not been able to wean off the steroids. She has occasional hallucinations, likely due to the steroids, that can cause her distress. She has a had a R hip arthroplasty and reports pain in the L hip. She uses a CPAP machine. None of her 9 children live in the area. She moved to assisted living last year and her husband of 3 years recently passed away. She met him in [place] when she was going to religious school and he was working on the nearby farm. She worked as a Doctor, [employer] at [employer].   The patient denies chest pain, shortness of breath, nocturnal dyspnea, orthopnea or peripheral edema.  There have been no palpitations, lightheadedness or syncope.  Complains of she is limited extensively by back pain, she also has polymyalgia rheumatica which contributes and she is now wheelchair-bound  essentially   .    DATE TEST EF   4/21 Echo  55 - 60 % No aortic stenosis   Date Cr K Hgb  4/22 0.74 4.0 13.6  9/22 0.7 3.9 11.6     Records and Results Reviewed As above   Past Medical History:  Diagnosis Date   Achalasia    Anemia    Arthritis    back   Chronic steroid use    Complete heart block ([place])    S/P PACEMAKER 2000 W/ GENERATOR CHANGE 2009   Empyema, right ([place]) PULMOLOGIST-- DR [name]   VATS 06/23/2012 cultures negative to date CXR 07/19/12 persistent airfluid levels/  CXR 11-01-2012 IMPROVE RIGHT PLEURAL EFFUSION   GERD (gastroesophageal reflux disease)    History of aspiration pneumonitis    DEC 2013   History of hiatal hernia    Hypertension    Inguinal hernia    right   Intrinsic urethral sphincter deficiency    Megaesophagus    Mixed stress and urge urinary incontinence    Multinodular thyroid 06/26/2012   Multi nodular goiter. Large nodules in both lobes of the gland.  These nodules fit national criteria for fine needle aspiration  biopsy if not previously assessed.     OSA on CPAP    cpap, doees not know settings   Polymyalgia rheumatica ([place])    on chronic Prednisone 62m daily   RBBB    S/P dilatation of esophageal stricture     Past Surgical History:  Procedure Laterality Date   APPENDECTOMY  1953   w/ removal benign kidney tumor    BALLOON DILATION  07/24/2012  Procedure: BALLOON DILATION;  Surgeon: Inda Castle, MD;  Location: [facility];  Service: Endoscopy;  Laterality: N/A;   BALLOON DILATION N/A 12/03/2015   Procedure: BALLOON DILATION;  Surgeon: Jill Pole, MD;  Location: [facility];  Service: Endoscopy;  Laterality: N/A;  pnuematic balloon   BALLOON DILATION N/A 03/16/2017   Procedure: BALLOON DILATION;  Surgeon: Jill Pole, MD;  Location: [facility];  Service: Endoscopy;  Laterality: N/A;  PNUEMATIC BALLOONS   BOTOX INJECTION  08/07/2012   Procedure: BOTOX INJECTION;  Surgeon: Inda Castle, MD;  Location:  [facility];  Service: Endoscopy;  Laterality: N/A;   BOTOX INJECTION N/A 05/24/2013   Procedure: MACROPLASTIQUE IMPLANT;  Surgeon: Jill Seal, MD;  Location: [facility];  Service: Urology;  Laterality: N/A;   BOTOX INJECTION  02/25/2014   Procedure: BOTOX INJECTION;  Surgeon: Inda Castle, MD;  Location: [facility];  Service: Endoscopy;;   CARDIAC PACEMAKER PLACEMENT  06/1999  DR RUTH GREENFIELD AT [facility]  ( LAST PACER CHECK 05-09-2013) for CHB/   END-OF-LIFE GENERATOR CHANGE  2009   CATARACT EXTRACTION W/ INTRAOCULAR LENS  IMPLANT, BILATERAL  2005   DILATION AND CURETTAGE OF UTERUS     ESOPHAGOGASTRODUODENOSCOPY  07/24/2012   Procedure: ESOPHAGOGASTRODUODENOSCOPY (EGD);  Surgeon: Inda Castle, MD;  Location: [facility];  Service: Endoscopy;  Laterality: N/A;   ESOPHAGOGASTRODUODENOSCOPY  08/07/2012   Procedure: ESOPHAGOGASTRODUODENOSCOPY (EGD);  Surgeon: Inda Castle, MD;  Location: [facility];  Service: Endoscopy;  Laterality: N/A;   ESOPHAGOGASTRODUODENOSCOPY N/A 02/25/2014   Procedure: ESOPHAGOGASTRODUODENOSCOPY (EGD);  Surgeon: Inda Castle, MD;  Location: [facility];  Service: Endoscopy;  Laterality: N/A;   ESOPHAGOGASTRODUODENOSCOPY N/A 03/16/2017   Procedure: ESOPHAGOGASTRODUODENOSCOPY (EGD);  Surgeon: Jill Pole, MD;  Location: [facility];  Service: Endoscopy;  Laterality: N/A;   ESOPHAGOGASTRODUODENOSCOPY N/A 08/02/2018   Procedure: ESOPHAGOGASTRODUODENOSCOPY (EGD);  Surgeon: Jill Pole, MD;  Location: [facility];  Service: Endoscopy;  Laterality: N/A;   ESOPHAGOGASTRODUODENOSCOPY (EGD) WITH ESOPHAGEAL DILATION  06/27/2012   Procedure: ESOPHAGOGASTRODUODENOSCOPY (EGD) WITH ESOPHAGEAL DILATION;  Surgeon: Ladene Artist, MD,FACG;  Location: [facility];  Service: Endoscopy;  Laterality: N/A;   ESOPHAGOGASTRODUODENOSCOPY (EGD) WITH PROPOFOL N/A 10/09/2015   Procedure: ESOPHAGOGASTRODUODENOSCOPY (EGD) WITH PROPOFOL ( WITH  BOTOX);  Surgeon: Milus Banister, MD;  Location: [facility];  Service: Endoscopy;  Laterality: N/A;   ESOPHAGOGASTRODUODENOSCOPY (EGD) WITH PROPOFOL N/A 12/03/2015   Procedure: ESOPHAGOGASTRODUODENOSCOPY (EGD) WITH PROPOFOL;  Surgeon: Jill Pole, MD;  Location: [facility];  Service: Endoscopy;  Laterality: N/A;   FOREIGN BODY REMOVAL  08/02/2018   Procedure: FOREIGN BODY REMOVAL;  Surgeon: Jill Pole, MD;  Location: [facility];  Service: Endoscopy;;   PACEMAKER GENERATOR CHANGE  12/13/2007   at [place]  2013   left 3rd toe HAMMERTOE REPAIR   TONSILLECTOMY  AS CHILD   TOTAL HIP ARTHROPLASTY Right 2001   VERICOSE VEIN LIGATION     VIDEO ASSISTED THORACOSCOPY (VATS)/DECORTICATION  06/23/2012   Procedure: VIDEO ASSISTED THORACOSCOPY (VATS)/DECORTICATION;  Surgeon: Jill Isaac, MD;  Location: [facility];  Service: Thoracic;  Laterality: Right;   [place] (VATS)/EMPYEMA     06/23/2012   VIDEO BRONCHOSCOPY  06/23/2012   Procedure: VIDEO BRONCHOSCOPY;  Surgeon: Jill Isaac, MD;  Location: [facility];  Service: Thoracic;  Laterality: N/A;    Current Outpatient Medications  Medication Sig Dispense Refill   acetaminophen (TYLENOL) 325 MG tablet Take 650  mg by mouth 3 (three) times daily.     acetaminophen (TYLENOL) 500 MG tablet Take 650 mg by mouth 3 (three) times daily as needed.     Calcium-Phosphorus-Vitamin D (CALCIUM/VITAMIN D3/ADULT GUMMY PO) Take 1 tablet by mouth See admin instructions. 500 mg calcium, 1000 iu vitamin D3, Phosphorous 254m. 2 daily     cholecalciferol (VITAMIN D3) 25 MCG (1000 UNIT) tablet Take 1,000 Units by mouth daily.     Collagen-Boron-Hyaluronic Acid ([employer] JOINT HEALTH TRIPLE ACTION PO) Take 1 tablet by mouth daily.     denosumab (PROLIA) 60 MG/ML SOSY injection Inject 60 mg into the skin every 6 (six) months. On 1st Monday of every 6th mo.     famotidine (PEPCID) 20 MG tablet Take 1 tablet (20 mg total) by mouth  daily.     Iron-Vitamin C 65-125 MG TABS Take 1 tablet by mouth in the morning.     Lidocaine 4 % OINT Apply 1 application topically in the morning and at bedtime.     Multiple Vitamins-Minerals (HM MULTIVITAMIN ADULT GUMMY PO) Take 1 tablet by mouth daily.     Multiple Vitamins-Minerals (OCUVITE EYE + MULTI PO) Take 1 tablet by mouth daily.      NON FORMULARY Heal and Soothe capsule; 300 mg; amt: 900 mg; oral Once A Day     polyethylene glycol (MIRALAX / GLYCOLAX) 17 g packet Take 17 g by mouth daily.     potassium chloride SA (KLOR-CON) 20 MEQ tablet Take 20 mEq by mouth 3 (three) times daily. Dilute tablets in 540mof water to dissolve     PREDNISONE PO Take 10 mg by mouth daily.     torsemide (DEMADEX) 20 MG tablet Take 20 mg by mouth daily.     HYDROcodone-acetaminophen (NORCO/VICODIN) 5-325 MG tablet Take 1 tablet by mouth daily. (Patient not taking: Reported on 05/11/2021)     No current facility-administered medications for this visit.    Allergies  Allergen Reactions   Actonel [Risedronate Sodium] Other (See Comments)    Joint aches; rechallenged --caused joint aches   Ivp Dye [Iodinated Diagnostic Agents] Nausea And Vomiting    Review of Systems negative except from HPI and PMH  Physical Exam BP 112/64   Pulse 87   Ht 5' (1.524 m)   Wt 152 lb 8 oz (69.2 kg)   SpO2 94%   BMI 29.78 kg/m  Well developed and well nourished in no acute distress HENT normal Neck supple with JVP-flat Clear Device pocket well healed; without hematoma or erythema.  There is no tethering  Regular rate and rhythm, no  gallop 2/6 murmur Abd-soft with active BS No Clubbing cyanosis   edema Skin-warm and dry A & Oriented  Grossly normal sensory and motor function  ECG sinus at 87 Intervals 21/13/38 Axis 77 Right bundle branch block    Assessment and  Plan  Complete Heart Block-intermittent  Sick Sinus syndrome   Atrial fibrillation-subclinical  Edema  Aortic  sclerosis  Pacemaker Medtronic    Intermittent complete heart block and her device is now at ERI.  We will undertake device generator replacement .We have reviewed the benefits and risks of generator replacement.  These include but are not limited to lead fracture and infection.  The patient understands, agrees and is willing to proceed.     She has achalasia, we will try to do the procedure with her the operative possible  Discussed end-of-life issues related to CODE STATUS/DNR as well as whether she  wanted to have her device generator replaced.  They would like the latter.  The family will have further discussions regarding CODE STATUS as it is not clear to me that DNR is the appropriate status for this lady.  No interval bleeding.  Not on anticoagulation    I,Mykaella Javier,acting as a scribe for Jill Axe, MD.,have documented all relevant documentation on the behalf of Jill Axe, MD,as directed by  Jill Axe, MD while in the presence of Jill Axe, MD.  I, Jill Axe, MD, have reviewed all documentation for this visit. The documentation on 05/11/21 for the exam, diagnosis, procedures, and orders are all accurate and complete.

## 2021-05-11 NOTE — Telephone Encounter (Signed)
I had Left a message for her daughter and Made referal to Neurology for Hallucinations and Possible Parkinson like Symptoms.

## 2021-05-12 LAB — BASIC METABOLIC PANEL
BUN/Creatinine Ratio: 25 (ref 12–28)
BUN: 19 mg/dL (ref 10–36)
CO2: 29 mmol/L (ref 20–29)
Calcium: 9.9 mg/dL (ref 8.7–10.3)
Chloride: 99 mmol/L (ref 96–106)
Creatinine, Ser: 0.75 mg/dL (ref 0.57–1.00)
Glucose: 125 mg/dL — ABNORMAL HIGH (ref 70–99)
Potassium: 4 mmol/L (ref 3.5–5.2)
Sodium: 142 mmol/L (ref 134–144)
eGFR: 76 mL/min/{1.73_m2} (ref >59)

## 2021-05-12 LAB — CBC
Hematocrit: 36.3 % (ref 34.0–46.6)
Hemoglobin: 12.6 g/dL (ref 11.1–15.9)
MCH: 33.3 pg — ABNORMAL HIGH (ref 26.6–33.0)
MCHC: 34.7 g/dL (ref 31.5–35.7)
MCV: 96 fL (ref 79–97)
Platelets: 180 10*3/uL (ref 150–450)
RBC: 3.78 x10E6/uL (ref 3.77–5.28)
RDW: 13 % (ref 11.7–15.4)
WBC: 7 10*3/uL (ref 3.4–10.8)

## 2021-05-13 DIAGNOSIS — M5459 Other low back pain: Secondary | ICD-10-CM | POA: Diagnosis not present

## 2021-05-13 DIAGNOSIS — M6281 Muscle weakness (generalized): Secondary | ICD-10-CM | POA: Diagnosis not present

## 2021-05-13 DIAGNOSIS — N3946 Mixed incontinence: Secondary | ICD-10-CM | POA: Diagnosis not present

## 2021-05-13 DIAGNOSIS — R2681 Unsteadiness on feet: Secondary | ICD-10-CM | POA: Diagnosis not present

## 2021-05-13 DIAGNOSIS — R29898 Other symptoms and signs involving the musculoskeletal system: Secondary | ICD-10-CM | POA: Diagnosis not present

## 2021-05-13 DIAGNOSIS — R41841 Cognitive communication deficit: Secondary | ICD-10-CM | POA: Diagnosis not present

## 2021-05-13 DIAGNOSIS — Z9181 History of falling: Secondary | ICD-10-CM | POA: Diagnosis not present

## 2021-05-14 DIAGNOSIS — R41841 Cognitive communication deficit: Secondary | ICD-10-CM | POA: Diagnosis not present

## 2021-05-14 DIAGNOSIS — N3946 Mixed incontinence: Secondary | ICD-10-CM | POA: Diagnosis not present

## 2021-05-14 DIAGNOSIS — M5459 Other low back pain: Secondary | ICD-10-CM | POA: Diagnosis not present

## 2021-05-14 DIAGNOSIS — Z9181 History of falling: Secondary | ICD-10-CM | POA: Diagnosis not present

## 2021-05-14 DIAGNOSIS — M6281 Muscle weakness (generalized): Secondary | ICD-10-CM | POA: Diagnosis not present

## 2021-05-14 DIAGNOSIS — R2681 Unsteadiness on feet: Secondary | ICD-10-CM | POA: Diagnosis not present

## 2021-05-14 LAB — CUP PACEART INCLINIC DEVICE CHECK
Date Time Interrogation Session: 20221103092708
Implantable Lead Implant Date: 20001227
Implantable Lead Implant Date: 20001227
Implantable Lead Location: 753859
Implantable Lead Location: 753860
Implantable Lead Model: 5076
Implantable Lead Model: 5076
Implantable Pulse Generator Implant Date: 20090603

## 2021-05-15 ENCOUNTER — Non-Acute Institutional Stay: Payer: Medicare Other | Admitting: Nurse Practitioner

## 2021-05-15 ENCOUNTER — Encounter: Payer: Self-pay | Admitting: Nurse Practitioner

## 2021-05-15 DIAGNOSIS — K5901 Slow transit constipation: Secondary | ICD-10-CM | POA: Diagnosis not present

## 2021-05-15 DIAGNOSIS — K220 Achalasia of cardia: Secondary | ICD-10-CM

## 2021-05-15 DIAGNOSIS — L539 Erythematous condition, unspecified: Secondary | ICD-10-CM | POA: Diagnosis not present

## 2021-05-15 DIAGNOSIS — R41841 Cognitive communication deficit: Secondary | ICD-10-CM | POA: Diagnosis not present

## 2021-05-15 DIAGNOSIS — R441 Visual hallucinations: Secondary | ICD-10-CM | POA: Diagnosis not present

## 2021-05-15 DIAGNOSIS — G8929 Other chronic pain: Secondary | ICD-10-CM

## 2021-05-15 DIAGNOSIS — M545 Low back pain, unspecified: Secondary | ICD-10-CM | POA: Diagnosis not present

## 2021-05-15 DIAGNOSIS — E876 Hypokalemia: Secondary | ICD-10-CM | POA: Diagnosis not present

## 2021-05-15 DIAGNOSIS — R251 Tremor, unspecified: Secondary | ICD-10-CM

## 2021-05-15 DIAGNOSIS — N3946 Mixed incontinence: Secondary | ICD-10-CM | POA: Diagnosis not present

## 2021-05-15 DIAGNOSIS — M810 Age-related osteoporosis without current pathological fracture: Secondary | ICD-10-CM

## 2021-05-15 DIAGNOSIS — M353 Polymyalgia rheumatica: Secondary | ICD-10-CM | POA: Diagnosis not present

## 2021-05-15 DIAGNOSIS — R2681 Unsteadiness on feet: Secondary | ICD-10-CM | POA: Diagnosis not present

## 2021-05-15 DIAGNOSIS — I5032 Chronic diastolic (congestive) heart failure: Secondary | ICD-10-CM | POA: Diagnosis not present

## 2021-05-15 DIAGNOSIS — G4733 Obstructive sleep apnea (adult) (pediatric): Secondary | ICD-10-CM | POA: Diagnosis not present

## 2021-05-15 DIAGNOSIS — Z9181 History of falling: Secondary | ICD-10-CM | POA: Diagnosis not present

## 2021-05-15 DIAGNOSIS — M6281 Muscle weakness (generalized): Secondary | ICD-10-CM | POA: Diagnosis not present

## 2021-05-15 DIAGNOSIS — M5459 Other low back pain: Secondary | ICD-10-CM | POA: Diagnosis not present

## 2021-05-15 DIAGNOSIS — D696 Thrombocytopenia, unspecified: Secondary | ICD-10-CM

## 2021-05-15 NOTE — Assessment & Plan Note (Signed)
Dysphagia diet/achalasia of esophagus, s/p dilation, on Pepcid, Pureed diet

## 2021-05-15 NOTE — Assessment & Plan Note (Signed)
takes Prolia. T score -2.9 2018
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
## 2021-05-15 NOTE — Assessment & Plan Note (Signed)
R+L buttock, a small scabbed over area left buttock about her thumb nail sized, moist, heat, pressure are contributory, apply barrier ointment, assist the patient with personal hygiene.

## 2021-05-15 NOTE — Assessment & Plan Note (Signed)
on Kcl, K 4.0 05/11/21
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
## 2021-05-15 NOTE — Assessment & Plan Note (Signed)
Pending neurology evaluation.

## 2021-05-15 NOTE — Assessment & Plan Note (Addendum)
Stable,  takes MiraLax qd. 

## 2021-05-15 NOTE — Assessment & Plan Note (Signed)
CPAP/O2 at night, underwent sleep study.

## 2021-05-15 NOTE — Assessment & Plan Note (Signed)
minimal edema BLE, BNP 122 in the past, echocardiogram  EF 55-60%, grade I diastolic dysfunction.  CXR bilateral effusions, on Torsemide. F/u cardiology. Bun/creat 19/0.75 eGFR 76 05/11/21 

## 2021-05-15 NOTE — Assessment & Plan Note (Signed)
plt 180 05/11/21

## 2021-05-15 NOTE — Assessment & Plan Note (Signed)
saw rheumatology, on Prednisone 10mg qd. ESR 41. CRP 22.7 03/18/21 

## 2021-05-15 NOTE — Assessment & Plan Note (Signed)
Bilateral lower back pain, mostly in sacral region, positional/movement associated, Norco for pain

## 2021-05-18 DIAGNOSIS — R2681 Unsteadiness on feet: Secondary | ICD-10-CM | POA: Diagnosis not present

## 2021-05-18 DIAGNOSIS — M6281 Muscle weakness (generalized): Secondary | ICD-10-CM | POA: Diagnosis not present

## 2021-05-18 DIAGNOSIS — M5459 Other low back pain: Secondary | ICD-10-CM | POA: Diagnosis not present

## 2021-05-18 DIAGNOSIS — N3946 Mixed incontinence: Secondary | ICD-10-CM | POA: Diagnosis not present

## 2021-05-18 DIAGNOSIS — Z9181 History of falling: Secondary | ICD-10-CM | POA: Diagnosis not present

## 2021-05-18 DIAGNOSIS — R41841 Cognitive communication deficit: Secondary | ICD-10-CM | POA: Diagnosis not present

## 2021-05-19 ENCOUNTER — Encounter: Payer: Self-pay | Admitting: Nurse Practitioner

## 2021-05-20 DIAGNOSIS — N3946 Mixed incontinence: Secondary | ICD-10-CM | POA: Diagnosis not present

## 2021-05-20 DIAGNOSIS — M5459 Other low back pain: Secondary | ICD-10-CM | POA: Diagnosis not present

## 2021-05-20 DIAGNOSIS — R41841 Cognitive communication deficit: Secondary | ICD-10-CM | POA: Diagnosis not present

## 2021-05-20 DIAGNOSIS — R2681 Unsteadiness on feet: Secondary | ICD-10-CM | POA: Diagnosis not present

## 2021-05-20 DIAGNOSIS — M6281 Muscle weakness (generalized): Secondary | ICD-10-CM | POA: Diagnosis not present

## 2021-05-20 DIAGNOSIS — Z9181 History of falling: Secondary | ICD-10-CM | POA: Diagnosis not present

## 2021-05-21 DIAGNOSIS — R41841 Cognitive communication deficit: Secondary | ICD-10-CM | POA: Diagnosis not present

## 2021-05-21 DIAGNOSIS — N3946 Mixed incontinence: Secondary | ICD-10-CM | POA: Diagnosis not present

## 2021-05-21 DIAGNOSIS — R2681 Unsteadiness on feet: Secondary | ICD-10-CM | POA: Diagnosis not present

## 2021-05-21 DIAGNOSIS — M6281 Muscle weakness (generalized): Secondary | ICD-10-CM | POA: Diagnosis not present

## 2021-05-21 DIAGNOSIS — Z9181 History of falling: Secondary | ICD-10-CM | POA: Diagnosis not present

## 2021-05-21 DIAGNOSIS — M5459 Other low back pain: Secondary | ICD-10-CM | POA: Diagnosis not present

## 2021-05-22 DIAGNOSIS — M5459 Other low back pain: Secondary | ICD-10-CM | POA: Diagnosis not present

## 2021-05-22 DIAGNOSIS — N3946 Mixed incontinence: Secondary | ICD-10-CM | POA: Diagnosis not present

## 2021-05-22 DIAGNOSIS — R41841 Cognitive communication deficit: Secondary | ICD-10-CM | POA: Diagnosis not present

## 2021-05-22 DIAGNOSIS — Z9181 History of falling: Secondary | ICD-10-CM | POA: Diagnosis not present

## 2021-05-22 DIAGNOSIS — R2681 Unsteadiness on feet: Secondary | ICD-10-CM | POA: Diagnosis not present

## 2021-05-22 DIAGNOSIS — M6281 Muscle weakness (generalized): Secondary | ICD-10-CM | POA: Diagnosis not present

## 2021-05-25 ENCOUNTER — Non-Acute Institutional Stay: Payer: Medicare Other | Admitting: Nurse Practitioner

## 2021-05-25 ENCOUNTER — Encounter: Payer: Self-pay | Admitting: Nurse Practitioner

## 2021-05-25 DIAGNOSIS — M5459 Other low back pain: Secondary | ICD-10-CM | POA: Diagnosis not present

## 2021-05-25 DIAGNOSIS — M353 Polymyalgia rheumatica: Secondary | ICD-10-CM | POA: Diagnosis not present

## 2021-05-25 DIAGNOSIS — I5032 Chronic diastolic (congestive) heart failure: Secondary | ICD-10-CM | POA: Diagnosis not present

## 2021-05-25 DIAGNOSIS — R1319 Other dysphagia: Secondary | ICD-10-CM | POA: Diagnosis not present

## 2021-05-25 DIAGNOSIS — G4733 Obstructive sleep apnea (adult) (pediatric): Secondary | ICD-10-CM | POA: Diagnosis not present

## 2021-05-25 DIAGNOSIS — K5901 Slow transit constipation: Secondary | ICD-10-CM | POA: Diagnosis not present

## 2021-05-25 DIAGNOSIS — G8929 Other chronic pain: Secondary | ICD-10-CM

## 2021-05-25 DIAGNOSIS — D696 Thrombocytopenia, unspecified: Secondary | ICD-10-CM

## 2021-05-25 DIAGNOSIS — M545 Low back pain, unspecified: Secondary | ICD-10-CM | POA: Diagnosis not present

## 2021-05-25 DIAGNOSIS — Z9181 History of falling: Secondary | ICD-10-CM | POA: Diagnosis not present

## 2021-05-25 DIAGNOSIS — L89301 Pressure ulcer of unspecified buttock, stage 1: Secondary | ICD-10-CM

## 2021-05-25 DIAGNOSIS — E876 Hypokalemia: Secondary | ICD-10-CM | POA: Diagnosis not present

## 2021-05-25 DIAGNOSIS — R441 Visual hallucinations: Secondary | ICD-10-CM

## 2021-05-25 DIAGNOSIS — R2681 Unsteadiness on feet: Secondary | ICD-10-CM | POA: Diagnosis not present

## 2021-05-25 DIAGNOSIS — M6281 Muscle weakness (generalized): Secondary | ICD-10-CM | POA: Diagnosis not present

## 2021-05-25 DIAGNOSIS — R41841 Cognitive communication deficit: Secondary | ICD-10-CM | POA: Diagnosis not present

## 2021-05-25 DIAGNOSIS — M810 Age-related osteoporosis without current pathological fracture: Secondary | ICD-10-CM

## 2021-05-25 DIAGNOSIS — N3946 Mixed incontinence: Secondary | ICD-10-CM | POA: Diagnosis not present

## 2021-05-25 NOTE — Assessment & Plan Note (Signed)
Stable,  takes MiraLax qd. 

## 2021-05-25 NOTE — Assessment & Plan Note (Signed)
saw rheumatology, on Prednisone 65m qd. ESR 41. CRP 22.7 03/18/21
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
## 2021-05-25 NOTE — Assessment & Plan Note (Signed)
Bilateral lower back pain, mostly in sacral region, positional/movement associated, Norco for pain

## 2021-05-25 NOTE — Assessment & Plan Note (Signed)
on Kcl, K 4.0 05/11/21

## 2021-05-25 NOTE — Assessment & Plan Note (Signed)
plt 180 05/11/21

## 2021-05-25 NOTE — Assessment & Plan Note (Signed)
CPAP/O2 at night, underwent sleep study.

## 2021-05-25 NOTE — Assessment & Plan Note (Signed)
minimal edema BLE, BNP 122 in the past, echocardiogram  EF 55-60%, grade I diastolic dysfunction.  CXR bilateral effusions, on Torsemide. F/u cardiology. Bun/creat 19/0.75 eGFR 76 05/11/21 
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
## 2021-05-25 NOTE — Assessment & Plan Note (Signed)
s/p dilation, on Pepcid, Pureed diet

## 2021-05-25 NOTE — Assessment & Plan Note (Signed)
takes Prolia. T score -2.9 2018

## 2021-05-25 NOTE — Assessment & Plan Note (Signed)
persisted excoriated scaly redness R+L buttocks, moist, heat, pressure are contributory, didn't respond well to barrier ointment. Foam dressing for protection seems helpful. Continue foam dressing daily, encourage/assist the patient with pressure reduction and person hygiene. Observe.

## 2021-05-27 DIAGNOSIS — M5459 Other low back pain: Secondary | ICD-10-CM | POA: Diagnosis not present

## 2021-05-27 DIAGNOSIS — N3946 Mixed incontinence: Secondary | ICD-10-CM | POA: Diagnosis not present

## 2021-05-27 DIAGNOSIS — M6281 Muscle weakness (generalized): Secondary | ICD-10-CM | POA: Diagnosis not present

## 2021-05-27 DIAGNOSIS — Z9181 History of falling: Secondary | ICD-10-CM | POA: Diagnosis not present

## 2021-05-27 DIAGNOSIS — R41841 Cognitive communication deficit: Secondary | ICD-10-CM | POA: Diagnosis not present

## 2021-05-27 DIAGNOSIS — R2681 Unsteadiness on feet: Secondary | ICD-10-CM | POA: Diagnosis not present

## 2021-05-29 DIAGNOSIS — R41841 Cognitive communication deficit: Secondary | ICD-10-CM | POA: Diagnosis not present

## 2021-05-29 DIAGNOSIS — M6281 Muscle weakness (generalized): Secondary | ICD-10-CM | POA: Diagnosis not present

## 2021-05-29 DIAGNOSIS — Z9181 History of falling: Secondary | ICD-10-CM | POA: Diagnosis not present

## 2021-05-29 DIAGNOSIS — N3946 Mixed incontinence: Secondary | ICD-10-CM | POA: Diagnosis not present

## 2021-05-29 DIAGNOSIS — M5459 Other low back pain: Secondary | ICD-10-CM | POA: Diagnosis not present

## 2021-05-29 DIAGNOSIS — R2681 Unsteadiness on feet: Secondary | ICD-10-CM | POA: Diagnosis not present

## 2021-06-01 DIAGNOSIS — R41841 Cognitive communication deficit: Secondary | ICD-10-CM | POA: Diagnosis not present

## 2021-06-01 DIAGNOSIS — Z9181 History of falling: Secondary | ICD-10-CM | POA: Diagnosis not present

## 2021-06-01 DIAGNOSIS — N3946 Mixed incontinence: Secondary | ICD-10-CM | POA: Diagnosis not present

## 2021-06-01 DIAGNOSIS — M6281 Muscle weakness (generalized): Secondary | ICD-10-CM | POA: Diagnosis not present

## 2021-06-01 DIAGNOSIS — R2681 Unsteadiness on feet: Secondary | ICD-10-CM | POA: Diagnosis not present

## 2021-06-01 DIAGNOSIS — M5459 Other low back pain: Secondary | ICD-10-CM | POA: Diagnosis not present

## 2021-06-02 ENCOUNTER — Non-Acute Institutional Stay: Payer: Medicare Other | Admitting: Internal Medicine

## 2021-06-02 ENCOUNTER — Encounter: Payer: Self-pay | Admitting: Internal Medicine

## 2021-06-02 DIAGNOSIS — R41841 Cognitive communication deficit: Secondary | ICD-10-CM | POA: Diagnosis not present

## 2021-06-02 DIAGNOSIS — M810 Age-related osteoporosis without current pathological fracture: Secondary | ICD-10-CM | POA: Diagnosis not present

## 2021-06-02 DIAGNOSIS — N3946 Mixed incontinence: Secondary | ICD-10-CM | POA: Diagnosis not present

## 2021-06-02 DIAGNOSIS — R2681 Unsteadiness on feet: Secondary | ICD-10-CM | POA: Diagnosis not present

## 2021-06-02 DIAGNOSIS — M353 Polymyalgia rheumatica: Secondary | ICD-10-CM

## 2021-06-02 DIAGNOSIS — G8929 Other chronic pain: Secondary | ICD-10-CM

## 2021-06-02 DIAGNOSIS — G4733 Obstructive sleep apnea (adult) (pediatric): Secondary | ICD-10-CM | POA: Diagnosis not present

## 2021-06-02 DIAGNOSIS — M545 Low back pain, unspecified: Secondary | ICD-10-CM | POA: Diagnosis not present

## 2021-06-02 DIAGNOSIS — R441 Visual hallucinations: Secondary | ICD-10-CM

## 2021-06-02 DIAGNOSIS — M6281 Muscle weakness (generalized): Secondary | ICD-10-CM | POA: Diagnosis not present

## 2021-06-02 DIAGNOSIS — I5032 Chronic diastolic (congestive) heart failure: Secondary | ICD-10-CM

## 2021-06-02 DIAGNOSIS — Z9181 History of falling: Secondary | ICD-10-CM | POA: Diagnosis not present

## 2021-06-02 DIAGNOSIS — M5459 Other low back pain: Secondary | ICD-10-CM | POA: Diagnosis not present

## 2021-06-02 DIAGNOSIS — R1319 Other dysphagia: Secondary | ICD-10-CM

## 2021-06-02 NOTE — Progress Notes (Addendum)
Location:   [facility] Room Number: [place] of Service:  ALF [phone]) Provider:  Veleta Miners MD  [name], MD  Patient Care Team: [name], MD as PCP - General (Internal Medicine) [name], MD as PCP - Electrophysiology (Cardiology) [place], [facility] [name], MD as Consulting Physician (Cardiology) Irine Seal, MD as Attending Physician (Urology) Clance, Armando Reichert, MD as Consulting Physician (Pulmonary Disease) Grace Isaac, MD (Inactive) as Consulting Physician (Cardiothoracic Surgery) Mauri Pole, MD as Consulting Physician (Gastroenterology)  Extended Emergency Contact Information Primary Emergency Contact: Casino,Dorothy Address: [address]          [place], [place] [address] Johnnette Litter of [place] Phone: [phone] Mobile Phone: [phone] Relation: Daughter Secondary Emergency Contact: Blanco, [place] [address] Johnnette Litter of [place] Phone: [phone] Mobile Phone: [phone] Relation: Daughter  Code Status:  DNR Goals of care: Advanced Directive information Advanced Directives 06/02/2021  Does Patient Have a Medical Advance Directive? Yes  Type of Paramedic of [place];Living will;Out of facility DNR (pink MOST or yellow form)  Does patient want to make changes to medical advance directive? No - Patient declined  Copy of [place] in Chart? Yes - validated most recent copy scanned in chart (See row information)  Would patient like information on creating a medical advance directive? -  Pre-existing out of facility DNR order (yellow form or pink MOST form) Yellow form placed in chart (order not valid for inpatient use);Pink MOST form placed in chart (order not valid for inpatient use)     Chief Complaint  Patient presents with   Acute Visit    Follow up of weakness     HPI:  Pt is a 87 y.o. female seen today for an  acute visit for weakness and Lower Back Pain   Patient has a history of PMR, esophageal dysphagia, diastolic CHF, s/p pacemaker, anemia, hallucinations visual, osteoporosis, compression fracture, OSA  Patient seen in her Room for Follow up per her family and Nurses  Back pain Now on Norco. Continues to need help with her ADLS. She still wants to stay here in [place] PMR seems to be controlled on Prednisone Failed tapering Will try again in few weeks Hallucinations Visual Plans to see Neurology. Not candidate for Aggressive work up Daughter wants to make sure she does not have Parkinson Dysphagia Has lost 8 lbs since my last visit Recent loss of her husband. She sya her mood is good. Lot of support from family  Walks with her walker But needs Help now Cannot do long distance  Past Medical History:  Diagnosis Date   Achalasia    Anemia    Arthritis    back   Chronic steroid use    Complete heart block ([place])    S/P PACEMAKER 2000 W/ GENERATOR CHANGE 2009   Empyema, right ([place]) PULMOLOGIST-- DR CLANCE   VATS 06/23/2012 cultures negative to date CXR 07/19/12 persistent airfluid levels/  CXR 11-01-2012 IMPROVE RIGHT PLEURAL EFFUSION   GERD (gastroesophageal reflux disease)    History of aspiration pneumonitis    DEC 2013   History of hiatal hernia    Hypertension    Inguinal hernia    right   Intrinsic urethral sphincter deficiency    Megaesophagus    Mixed stress and urge urinary incontinence    Multinodular thyroid 06/26/2012   Multi nodular  goiter. Large nodules in both lobes of the gland.  These nodules fit national criteria for fine needle aspiration  biopsy if not previously assessed.     OSA on CPAP    cpap, doees not know settings   Polymyalgia rheumatica ([place])    on chronic Prednisone 5mg  daily   RBBB    S/P dilatation of esophageal stricture    Past Surgical History:  Procedure Laterality Date   APPENDECTOMY  1953   w/ removal benign kidney tumor    BALLOON DILATION   07/24/2012   Procedure: BALLOON DILATION;  Surgeon: Inda Castle, MD;  Location: [facility];  Service: Endoscopy;  Laterality: N/A;   BALLOON DILATION N/A 12/03/2015   Procedure: BALLOON DILATION;  Surgeon: Mauri Pole, MD;  Location: [facility];  Service: Endoscopy;  Laterality: N/A;  pnuematic balloon   BALLOON DILATION N/A 03/16/2017   Procedure: BALLOON DILATION;  Surgeon: Mauri Pole, MD;  Location: [facility];  Service: Endoscopy;  Laterality: N/A;  PNUEMATIC BALLOONS   BOTOX INJECTION  08/07/2012   Procedure: BOTOX INJECTION;  Surgeon: Inda Castle, MD;  [facility]: [facility];  Service: Endoscopy;  Laterality: N/A;   BOTOX INJECTION N/A 05/24/2013   Procedure: MACROPLASTIQUE IMPLANT;  Surgeon: Irine Seal, MD;  Location: [facility];  Service: Urology;  Laterality: N/A;   BOTOX INJECTION  02/25/2014   Procedure: BOTOX INJECTION;  Surgeon: Inda Castle, MD;  [facility]: [facility];  Service: Endoscopy;;   CARDIAC PACEMAKER PLACEMENT  06/1999  DR RUTH GREENFIELD AT [facility]  ( LAST PACER CHECK 05-09-2013) for CHB/   END-OF-LIFE GENERATOR CHANGE  2009   CATARACT EXTRACTION W/ INTRAOCULAR LENS  IMPLANT, BILATERAL  2005   DILATION AND CURETTAGE OF UTERUS     ESOPHAGOGASTRODUODENOSCOPY  07/24/2012   Procedure: ESOPHAGOGASTRODUODENOSCOPY (EGD);  Surgeon: Inda Castle, MD;  Location: [facility];  Service: Endoscopy;  Laterality: N/A;   ESOPHAGOGASTRODUODENOSCOPY  08/07/2012   Procedure: ESOPHAGOGASTRODUODENOSCOPY (EGD);  Surgeon: Inda Castle, MD;  Location: [facility];  Service: Endoscopy;  Laterality: N/A;   ESOPHAGOGASTRODUODENOSCOPY N/A 02/25/2014   Procedure: ESOPHAGOGASTRODUODENOSCOPY (EGD);  Surgeon: Inda Castle, MD;  Location: [facility];  Service: Endoscopy;  Laterality: N/A;   ESOPHAGOGASTRODUODENOSCOPY N/A 03/16/2017   Procedure: ESOPHAGOGASTRODUODENOSCOPY (EGD);  Surgeon: Mauri Pole, MD;  Location[facility] END[facility];   Service: Endoscopy;  Laterality: N/A;   ESOPHAGOGASTRODUODENOSCOPY N/A 08/02/2018   Procedure: ESOPHAGOGASTRODUODENOSCOPY (EGD);  Surgeon: Mauri Pole, MD;  Location: [facility];  Service: Endoscopy;  Laterality: N/A;   ESOPHAGOGASTRODUODENOSCOPY (EGD) WITH ESOPHAGEAL DILATION  06/27/2012   Procedure: ESOPHAGOGASTRODUODENOSCOPY (EGD) WITH ESOPHAGEAL DILATION;  Surgeon: Ladene Artist, MD,FACG;  Location: [facility];  Service: Endoscopy;  Laterality: N/A;   ESOPHAGOGASTRODUODENOSCOPY (EGD) WITH PROPOFOL N/A 10/09/2015   Procedure: ESOPHAGOGASTRODUODENOSCOPY (EGD) WITH PROPOFOL ( WITH BOTOX);  Surgeon: Milus Banister, MD;  Location: [facility];  Service: Endoscopy;  Laterality: N/A;   ESOPHAGOGASTRODUODENOSCOPY (EGD) WITH PROPOFOL N/A 12/03/2015   Procedure: ESOPHAGOGASTRODUODENOSCOPY (EGD) WITH PROPOFOL;  Surgeon: Mauri Pole, MD;  Location: [facility]COPY;  Service: Endoscopy;  Laterality: N/A;   FOREIGN BODY REMOVAL  08/02/2018   Procedure: FOREIGN BODY REMOVAL;  Surgeon: Mauri Pole, MD;  Location: [facility];  Service: Endoscopy;;   PACEMAKER GENERATOR CHANGE  12/13/2007   at [facility]  2013   left 3rd toe HAMMERTOE REPAIR   TONSILLECTOMY  AS CHILD   TOTAL HIP ARTHROPLASTY Right 2001  VERICOSE VEIN LIGATION     VIDEO ASSISTED THORACOSCOPY (VATS)/DECORTICATION  06/23/2012   Procedure: VIDEO ASSISTED THORACOSCOPY (VATS)/DECORTICATION;  Surgeon: Grace Isaac, MD;  Location: [facility];  Service: Thoracic;  Laterality: Right;   VIDEO ASSISTED THORACOSCOPY (VATS)/EMPYEMA     06/23/2012   VIDEO BRONCHOSCOPY  06/23/2012   Procedure: VIDEO BRONCHOSCOPY;  Surgeon: Grace Isaac, MD;  Location: [facility];  Service: Thoracic;  Laterality: N/A;    Allergies  Allergen Reactions   Actonel [Risedronate Sodium] Other (See Comments)    Joint aches; rechallenged --caused joint aches   Ivp Dye [Iodinated Diagnostic Agents] Nausea And Vomiting    Allergies as of  06/02/2021       Reactions   Actonel [risedronate Sodium] Other (See Comments)   Joint aches; rechallenged --caused joint aches   Ivp Dye [iodinated Diagnostic Agents] Nausea And Vomiting        Medication List        Accurate as of June 02, 2021  2:41 PM. If you have any questions, ask your nurse or doctor.          STOP taking these medications    denosumab 60 MG/ML Sosy injection Commonly known as: PROLIA Stopped by: [name], MD       TAKE these medications    acetaminophen 500 MG tablet Commonly known as: TYLENOL Take 650 mg by mouth every 6 (six) hours as needed.   acetaminophen 325 MG tablet Commonly known as: TYLENOL Take 650 mg by mouth 3 (three) times daily.   CALCIUM/VITAMIN D3/ADULT GUMMY PO Take 1 tablet by mouth See admin instructions. 500 mg calcium, 1000 iu vitamin D3, Phosphorous 230mg . 2 daily   cholecalciferol 25 MCG (1000 UNIT) tablet Commonly known as: VITAMIN D3 Take 1,000 Units by mouth daily.   [employer] JOINT HEALTH TRIPLE ACTION PO Take 1 tablet by mouth daily.   famotidine 20 MG tablet Commonly known as: PEPCID Take 1 tablet (20 mg total) by mouth daily.   HM MULTIVITAMIN ADULT GUMMY PO Take 1 tablet by mouth daily.   OCUVITE EYE + MULTI PO Take 1 tablet by mouth daily.   HYDROcodone-acetaminophen 5-325 MG tablet Commonly known as: NORCO/VICODIN Take 0.5 tablets by mouth daily.   Iron-Vitamin C 65-125 MG Tabs Take 1 tablet by mouth in the morning.   Lidocaine 4 % Oint Apply 1 patch topically in the morning and at bedtime.   NON FORMULARY Heal and Soothe capsule; 300 mg; amt: 900 mg; oral Once A Day   polyethylene glycol 17 g packet Commonly known as: MIRALAX / GLYCOLAX Take 17 g by mouth daily.   potassium chloride SA 20 MEQ tablet Commonly known as: KLOR-CON Take 20 mEq by mouth 3 (three) times daily. Dilute tablets in 48ml of water to dissolve   PREDNISONE PO Take 10 mg by mouth daily.   Pro-Stat  Liqd Take 30 mLs by mouth 2 (two) times daily.   torsemide 20 MG tablet Commonly known as: DEMADEX Take 20 mg by mouth daily.        Review of Systems  Constitutional:  Positive for activity change, appetite change and unexpected weight change.  HENT: Negative.    Respiratory: Negative.    Cardiovascular:  Positive for leg swelling.  Gastrointestinal: Negative.   Genitourinary: Negative.   Musculoskeletal:  Positive for back pain and gait problem.  Skin: Negative.   Neurological:  Positive for weakness.  Psychiatric/Behavioral:  Positive for hallucinations.    Immunization History  Administered Date(s) Administered   Influenza Split 09/11/2012, 04/12/2013, 04/30/2015   Influenza, High Dose Seasonal PF 04/18/2017, 04/25/2019   Influenza,inj,Quad PF,6+ Mos 04/13/2018   Influenza-Unspecified 09/19/2012, 05/03/2014, 04/22/2016, 04/20/2017, 04/22/2020, 04/30/2021   Moderna SARS-COV2 Booster Vaccination 12/09/2020   Moderna Sars-Covid-2 Vaccination 07/16/2019, 08/13/2019, 05/20/2020, 03/31/2021   Pneumococcal Conjugate-13 04/16/2015   Pneumococcal Polysaccharide-23 07/12/2004   Td 01/10/2012   Tdap 11/05/2015   Zoster Recombinat (Shingrix) 02/07/2016   Pertinent  Health Maintenance Due  Topic Date Due   INFLUENZA VACCINE  Completed   DEXA SCAN  Completed   Fall Risk 10/20/2019 10/21/2019 10/21/2019 10/22/2019 10/23/2019  Falls in the past year? - - - - 0  Number of falls in past year - - - - -  Was there an injury with Fall? - - - - -  Fall Risk Category Calculator - - - - -  Fall Risk Category - - - - -  Patient Fall Risk Level High fall risk High fall risk High fall risk High fall risk -   Functional Status Survey:    Vitals:   06/02/21 1421  BP: 140/80  Pulse: 72  Resp: 20  Temp: 99.6 F (37.6 C)  SpO2: 95%  Weight: 145 lb (65.8 kg)  Height: 5' (1.524 m)   Body mass index is 28.32 kg/m. Physical Exam Vitals reviewed.  Constitutional:      Appearance:  Normal appearance.  HENT:     Head: Normocephalic.     Nose: Nose normal.     Mouth/Throat:     Mouth: Mucous membranes are moist.     Pharynx: Oropharynx is clear.  Eyes:     Pupils: Pupils are equal, round, and reactive to light.  Cardiovascular:     Rate and Rhythm: Normal rate and regular rhythm.     Pulses: Normal pulses.     Heart sounds: Normal heart sounds. No murmur heard. Pulmonary:     Effort: Pulmonary effort is normal.     Breath sounds: Normal breath sounds.  Abdominal:     General: Abdomen is flat. Bowel sounds are normal.     Palpations: Abdomen is soft.  Musculoskeletal:     Cervical back: Neck supple.     Comments: Mild edema Bilateral  Skin:    General: Skin is warm.  Neurological:     General: No focal deficit present.     Mental Status: She is alert and oriented to person, place, and time.     Comments: Was able to stand for me and walk with her walker  Psychiatric:        Mood and Affect: Mood normal.        Thought Content: Thought content normal.    Labs reviewed: Recent Labs    10/30/20 1203 03/03/21 0000 03/13/21 0000 03/19/21 0000 05/11/21 1556  NA 142   < > 143  143 144 142  K 4.0   < > 3.3*  3.3* 3.9  3.6 4.0  CL 106   < > 102  102 104 99  CO2 27   < > 33*  33* 34* 29  GLUCOSE 127*  --   --   --  125*  BUN 18   < > 16  16 23* 19  CREATININE 0.74   < > 0.7  0.7 0.7 0.75  CALCIUM 8.8*   < > 9.6  9.6 9.4 9.9   < > = values in this interval not displayed.   Recent Labs  10/04/20 0000 03/03/21 0000 03/19/21 0000  AST 13 13 12*  ALT 9 10 7   ALKPHOS 48 54 70  ALBUMIN 3.5 3.3* 3.2*   Recent Labs    10/04/20 0000 10/30/20 1203 03/03/21 0000 03/19/21 0000 05/11/21 1556  WBC 5.7 7.8 5.9 4.8 7.0  NEUTROABS 3,295.00  --  3,180.00 2,534.00  --   HGB 12.7 13.6 12.3 11.6* 12.6  HCT 38 42.9 36 35* 36.3  MCV  --  106.5*  --   --  96  PLT 130* 124* 109* 118* 180   Lab Results  Component Value Date   TSH 0.50 03/03/2021    Lab Results  Component Value Date   HGBA1C 5.3 03/03/2021   Lab Results  Component Value Date   CHOL 146 03/03/2021   HDL 51 03/03/2021   LDLCALC 80 03/03/2021   TRIG 71 03/03/2021   CHOLHDL 3.3 02/26/2019    Significant Diagnostic Results in last 30 days:  CUP PACEART INCLINIC DEVICE CHECK  Result Date: 05/14/2021 Device not checked due to unknown battery status. At last OV 10/2020 estimated longevity 3 months. Gen change planned for 06/10/2021 with Eulah Citizen BSN,RN,CCDS   Assessment/Plan Chronic midline low back pain without sciatica Continue on Norco and Tylenol No Compression Fractures. Marked Degenerative changes  Visual hallucination Has appointment with Neurology Chronic diastolic congestive heart failure (HCC) On Low dose of Toresimide EF 55% in 4/22 Polymyalgia rheumatica ([place]) On Prednisone  Failed taper Will try again in few months Esophageal dysphagia Has lost some weigh tbut continues to tolerate Puree Diet Obstructive sleep apnea syndrome On CPAP Age-related osteoporosis without current pathological fracture Suppose to be in Prol[place] It seems she never received her shot in [place] Family does not want to restart at this time Will Discuss again    Family/ staff Communication:   Labs/tests ordered:

## 2021-06-03 DIAGNOSIS — N3946 Mixed incontinence: Secondary | ICD-10-CM | POA: Diagnosis not present

## 2021-06-03 DIAGNOSIS — M5459 Other low back pain: Secondary | ICD-10-CM | POA: Diagnosis not present

## 2021-06-03 DIAGNOSIS — Z9181 History of falling: Secondary | ICD-10-CM | POA: Diagnosis not present

## 2021-06-03 DIAGNOSIS — R2681 Unsteadiness on feet: Secondary | ICD-10-CM | POA: Diagnosis not present

## 2021-06-03 DIAGNOSIS — R41841 Cognitive communication deficit: Secondary | ICD-10-CM | POA: Diagnosis not present

## 2021-06-03 DIAGNOSIS — M6281 Muscle weakness (generalized): Secondary | ICD-10-CM | POA: Diagnosis not present

## 2021-06-05 DIAGNOSIS — Z9181 History of falling: Secondary | ICD-10-CM | POA: Diagnosis not present

## 2021-06-05 DIAGNOSIS — R2681 Unsteadiness on feet: Secondary | ICD-10-CM | POA: Diagnosis not present

## 2021-06-05 DIAGNOSIS — R41841 Cognitive communication deficit: Secondary | ICD-10-CM | POA: Diagnosis not present

## 2021-06-05 DIAGNOSIS — N3946 Mixed incontinence: Secondary | ICD-10-CM | POA: Diagnosis not present

## 2021-06-05 DIAGNOSIS — M5459 Other low back pain: Secondary | ICD-10-CM | POA: Diagnosis not present

## 2021-06-05 DIAGNOSIS — M6281 Muscle weakness (generalized): Secondary | ICD-10-CM | POA: Diagnosis not present

## 2021-06-08 DIAGNOSIS — M5459 Other low back pain: Secondary | ICD-10-CM | POA: Diagnosis not present

## 2021-06-08 DIAGNOSIS — M6281 Muscle weakness (generalized): Secondary | ICD-10-CM | POA: Diagnosis not present

## 2021-06-08 DIAGNOSIS — R2681 Unsteadiness on feet: Secondary | ICD-10-CM | POA: Diagnosis not present

## 2021-06-08 DIAGNOSIS — R41841 Cognitive communication deficit: Secondary | ICD-10-CM | POA: Diagnosis not present

## 2021-06-08 DIAGNOSIS — N3946 Mixed incontinence: Secondary | ICD-10-CM | POA: Diagnosis not present

## 2021-06-08 DIAGNOSIS — Z9181 History of falling: Secondary | ICD-10-CM | POA: Diagnosis not present

## 2021-06-09 ENCOUNTER — Telehealth: Payer: Self-pay

## 2021-06-09 DIAGNOSIS — R2681 Unsteadiness on feet: Secondary | ICD-10-CM | POA: Diagnosis not present

## 2021-06-09 DIAGNOSIS — R41841 Cognitive communication deficit: Secondary | ICD-10-CM | POA: Diagnosis not present

## 2021-06-09 DIAGNOSIS — M6281 Muscle weakness (generalized): Secondary | ICD-10-CM | POA: Diagnosis not present

## 2021-06-09 DIAGNOSIS — M5459 Other low back pain: Secondary | ICD-10-CM | POA: Diagnosis not present

## 2021-06-09 DIAGNOSIS — N3946 Mixed incontinence: Secondary | ICD-10-CM | POA: Diagnosis not present

## 2021-06-09 DIAGNOSIS — Z9181 History of falling: Secondary | ICD-10-CM | POA: Diagnosis not present

## 2021-06-09 NOTE — Telephone Encounter (Signed)
Attempted phone call to pt and left voicemail to contact RN today as pt's generator change needs to be rescheduled to 06/17/2021.

## 2021-06-09 NOTE — Telephone Encounter (Signed)
Spoke with pt's daughter, Magdalen Spatz and advised of need to reschedule generator change appointment to 06/17/2021 with same arrival time of 1030am.  Pt's daughter verbalizes understanding and thanked Therapist, sports for the call.

## 2021-06-09 NOTE — Progress Notes (Signed)
Assessment/Plan:    Gait instabiltiy No evidence of Parkinsons Disease today.  Reassurance provided that she meets no [place] brain bank criteria for the dx. Suspect this is due to orthopedic/degen changes. Vision change/diplopia Will do MG labs to make sure that we are not missing anything.  MG is on ddx but low on it.  Want to make sure not missing anything Visual hallucinations Pt unable to have MRI due to PPM We will do CT brain.  Likely will just show atrophy and WMD.  Discussed this with patient and daughter. Wonder if could have charles bonnet syndrome.  She sees ophth/retina but unclear how bad vision is.  F/u ophth and ask about this Will do neurocog testing.  Not sure that her degree of memory loss explains hallucinations.  Certainly the prednisone could be contributing/causing. F/u depending on above.  Pt hoping not to have to come back regularly.  Subjective:   [name] was seen today in the movement disorders clinic for neurologic consultation at the request of Mast, Man X, NP.  The consultation is for the evaluation of possible Parkinson's disease.  Medical records made available to me are reviewed.  Per medical records, patient having some tremor and visual hallucinations and daughter was concerned about possible Parkinson's disease.  Daughter here and supplements hx.  Daughter states that pt on prednisone for PMR.  In Oct, 2020, pt acutely declined b/c of anemia and FTT (wasn't eating due to achalasia).  Pt was having hallucinations (acute at that time).  That hasn't changed in the 2 years.  Daughter also thought she noted mild facial droop (daughter could not remember what side).     Specific Symptoms:  Tremor: Yes.  , daughter noted it in the L hand first but now daughter sees in the R hand when pt writes/hold cup coffee.   No tremor in the legs Family hx of similar:  No. Voice: got deeper ("I don't sing soprano anymore"); daughter said some hoarseness Sleep: gets  to sleep ok; wakes up spontaneously at 3am  Vivid Dreams:  pt denies but daughter states that pt reported in past she was having nightmares Postural symptoms:  No., is usually in WC.  Gets PT 3 times per week and ambulates with PT with walker.  She states that she is in the [facility] b/c of back pain  Falls?  Has slid out of the bed to the floor in the past; she is now assisted out of the bed.   Bradykinesia symptoms: slow movements and difficulty getting out of a chair; drags feet some Loss of smell:  "average" per pt but daughter thinks its decreased Urinary Incontinence:  Yes.   Difficulty Swallowing:  Yes.  GI notes are reviewed.  Patient has history of achalasia and has had esophageal dilation in the past. Trouble with ADL's:  Yes.  , gets assistance with dressing - she states that she is slow and aides want to help to make it faster Memory changes:  Yes.  , short term loss>long term loss.  Trouble with grandchildrens names Hallucinations:  Yes.  , states that she knows that they aren't real - "I see rabbits on the ceiling and they go away in a few sec."  Family not so sure that she doesn't always know not real.  Daughter describes one auditory hallucination in which she heard someone breathing. N/V:  No. Lightheaded:  occasionally  Syncope: No. Diplopia:  Yes.  , lasts until "I move my  head and get a different take on it."  Goes away if closes eye   Neuroimaging of the brain has not previously been performed.     ALLERGIES:   Allergies  Allergen Reactions   Actonel [Risedronate Sodium] Other (See Comments)    Joint aches; rechallenged --caused joint aches   Ivp Dye [Iodinated Diagnostic Agents] Nausea And Vomiting    CURRENT MEDICATIONS:  Current Outpatient Medications  Medication Instructions   acetaminophen (TYLENOL) 650 mg, Oral, Every 6 hours PRN   acetaminophen (TYLENOL) 650 mg, Oral, 3 times daily   Amino Acids-Protein Hydrolys (PRO-STAT) LIQD 30 mLs, Oral, 2 times daily    Calcium-Phosphorus-Vitamin D (CALCIUM/VITAMIN D3/ADULT GUMMY PO) 1 tablet, Oral, See admin instructions, 500 mg calcium, 1000 iu vitamin D3, Phosphorous 230mg . 2 daily   cholecalciferol (VITAMIN D3) 1,000 Units, Oral, Daily   Collagen-Boron-Hyaluronic Acid ([employer] JOINT HEALTH TRIPLE ACTION PO) 1 tablet, Oral, Daily   denosumab (PROLIA) 60 mg, Subcutaneous, Every 6 months   famotidine (PEPCID) 20 mg, Oral, Daily   HYDROcodone-acetaminophen (NORCO/VICODIN) 5-325 MG tablet 0.5 tablets, Oral, Daily   Iron-Vitamin C 65-125 MG TABS 1 tablet, Oral, Every morning   Lidocaine 4 % OINT 1 patch, Apply externally, 2 times daily   Multiple Vitamins-Minerals (HM MULTIVITAMIN ADULT GUMMY PO) 1 tablet, Oral, Daily   Multiple Vitamins-Minerals (OCUVITE EYE + MULTI PO) 1 tablet, Oral, Daily   NON FORMULARY Heal and Soothe capsule; 300 mg; amt: 900 mg; oral<BR>Once A Day    polyethylene glycol (MIRALAX / GLYCOLAX) 17 g, Oral, Daily   potassium chloride SA (KLOR-CON) 20 MEQ tablet 20 mEq, Oral, 3 times daily, Dilute tablets in 60ml of water to dissolve   PREDNISONE PO 10 mg, Oral, Daily   torsemide (DEMADEX) 20 mg, Oral, Daily    Objective:   VITALS:   Vitals:   06/11/21 1337  BP: 138/68  Pulse: 87  SpO2: 98%  Weight: 150 lb (68 kg)  Height: 5' (1.524 m)    GEN:  The patient appears stated age and is in NAD. HEENT:  Normocephalic, atraumatic.  The mucous membranes are moist. The superficial temporal arteries are without ropiness or tenderness. CV:  RRR Lungs:  CTAB Neck/HEME:  There are no carotid bruits bilaterally.  Neurological examination:  Orientation: The patient is alert and oriented x3. She provides most of her hx quite well, looking to her daughter for some help on finer aspects. Cranial nerves: There is good facial symmetry. Extraocular muscles are intact. The visual fields are full to confrontational testing. The speech is fluent and clear. Soft palate rises symmetrically and there is no  tongue deviation. Hearing is intact to conversational tone. Sensation: Sensation is intact to light and pinprick throughout (facial, trunk, extremities). Vibration is decreased distally. There is no extinction with double simultaneous stimulation. There is no sensory dermatomal level identified. Motor: Strength is 5/5 in the bilateral upper and lower extremities.   Shoulder shrug is equal and symmetric.  There is no pronator drift. Deep tendon reflexes: Deep tendon reflexes are 1/4 at the bilateral biceps, triceps, brachioradialis, patella and achilles. Plantar responses are downgoing bilaterally.  Movement examination: Tone: There is normal tone in the bilateral upper extremities.  The tone in the lower extremities is normal.  Abnormal movements: There is no rest tremor.  There is no postural tremor.  There is no intention tremor. Coordination:  There is no decremation with RAM's, with any form of RAMS, including alternating supination and pronation of the forearm, hand  opening and closing, finger taps, heel taps and toe taps. Gait and Station: The patient requires assistance out of the chair from the examiner/daughter.  She is given a walker.  Her gait is very antalgic, but she does not shuffle at all. I have reviewed and interpreted the following labs independently   Chemistry      Component Value Date/Time   NA 142 05/11/2021 1556   K 4.0 05/11/2021 1556   K 3.6 03/19/2021 0000   CL 99 05/11/2021 1556   CO2 29 05/11/2021 1556   BUN 19 05/11/2021 1556   CREATININE 0.75 05/11/2021 1556   CREATININE 0.61 11/05/2019 0748   GLU 107 03/19/2021 0000      Component Value Date/Time   CALCIUM 9.9 05/11/2021 1556   ALKPHOS 70 03/19/2021 0000   AST 12 (A) 03/19/2021 0000   ALT 7 03/19/2021 0000   BILITOT 0.6 11/05/2019 0748      Lab Results  Component Value Date   TSH 0.50 03/03/2021   Lab Results  Component Value Date   WBC 7.0 05/11/2021   HGB 12.6 05/11/2021   HCT 36.3 05/11/2021    MCV 96 05/11/2021   PLT 180 05/11/2021     Total time spent on today's visit was 78 minutes, including both face-to-face time and nonface-to-face time.  Time included that spent on review of records (prior notes available to me/labs/imaging if pertinent), discussing treatment and goals, answering patient's questions and coordinating care.  Cc:  [name], MD

## 2021-06-10 DIAGNOSIS — R2681 Unsteadiness on feet: Secondary | ICD-10-CM | POA: Diagnosis not present

## 2021-06-10 DIAGNOSIS — R41841 Cognitive communication deficit: Secondary | ICD-10-CM | POA: Diagnosis not present

## 2021-06-10 DIAGNOSIS — M6281 Muscle weakness (generalized): Secondary | ICD-10-CM | POA: Diagnosis not present

## 2021-06-10 DIAGNOSIS — Z9181 History of falling: Secondary | ICD-10-CM | POA: Diagnosis not present

## 2021-06-10 DIAGNOSIS — N3946 Mixed incontinence: Secondary | ICD-10-CM | POA: Diagnosis not present

## 2021-06-10 DIAGNOSIS — M5459 Other low back pain: Secondary | ICD-10-CM | POA: Diagnosis not present

## 2021-06-11 ENCOUNTER — Ambulatory Visit (INDEPENDENT_AMBULATORY_CARE_PROVIDER_SITE_OTHER): Payer: Medicare Other | Admitting: Neurology

## 2021-06-11 ENCOUNTER — Encounter: Payer: Self-pay | Admitting: Neurology

## 2021-06-11 ENCOUNTER — Other Ambulatory Visit: Payer: Medicare Other

## 2021-06-11 ENCOUNTER — Other Ambulatory Visit: Payer: Self-pay

## 2021-06-11 VITALS — BP 138/68 | HR 87 | Ht 60.0 in | Wt 150.0 lb

## 2021-06-11 DIAGNOSIS — H532 Diplopia: Secondary | ICD-10-CM

## 2021-06-11 DIAGNOSIS — R2681 Unsteadiness on feet: Secondary | ICD-10-CM

## 2021-06-11 DIAGNOSIS — R531 Weakness: Secondary | ICD-10-CM | POA: Diagnosis not present

## 2021-06-11 DIAGNOSIS — R441 Visual hallucinations: Secondary | ICD-10-CM

## 2021-06-11 NOTE — Patient Instructions (Addendum)
Your provider has requested that you have labwork completed today. The lab is located on the Second floor at [address], within the [facility] office. When you get off the elevator, turn right and go in the  Endocrinology [address]; the first brown door on the left.  Tell the ladies behind the desk that you are there for lab work. If you are not called within 15 minutes please check with the front desk.   Once you complete your labs you are free to go. You will receive a call or message via MyChart with your lab results.    You have been referred for a neurocognitive evaluation (i.e., evaluation of memory and thinking abilities). Please bring someone with you to this appointment if possible, as it is helpful for the neuropsychologist to hear from both you and another adult who knows you well. Please bring eyeglasses and hearing aids if you wear them and take any medications as you normally would.    The evaluation will take approximately 2-3 hours and has two parts:   The first part is a clinical interview with the neuropsychologist, Dr. Merz.  During the interview, the neuropsychologist will speak with you and the individual you brought to the appointment.    The second part of the evaluation is testing with the doctor's technician, aka psychometrician, Dana or Kim. During the testing, the technician will ask you to remember different types of material, solve problems, and answer some questionnaires. Your family member will not be present for this portion of the evaluation.   Please note: We have to reserve several hours of the neuropsychologist's time and the psychometrician's time for your evaluation appointment. As such, there is a No-Show fee of $100. If you are unable to attend any of your appointments, please contact our office as soon as possible to reschedule.  

## 2021-06-12 DIAGNOSIS — M6281 Muscle weakness (generalized): Secondary | ICD-10-CM | POA: Diagnosis not present

## 2021-06-12 DIAGNOSIS — M5459 Other low back pain: Secondary | ICD-10-CM | POA: Diagnosis not present

## 2021-06-12 DIAGNOSIS — Z9181 History of falling: Secondary | ICD-10-CM | POA: Diagnosis not present

## 2021-06-12 DIAGNOSIS — R2681 Unsteadiness on feet: Secondary | ICD-10-CM | POA: Diagnosis not present

## 2021-06-12 DIAGNOSIS — N3946 Mixed incontinence: Secondary | ICD-10-CM | POA: Diagnosis not present

## 2021-06-12 DIAGNOSIS — R29898 Other symptoms and signs involving the musculoskeletal system: Secondary | ICD-10-CM | POA: Diagnosis not present

## 2021-06-15 DIAGNOSIS — R2681 Unsteadiness on feet: Secondary | ICD-10-CM | POA: Diagnosis not present

## 2021-06-15 DIAGNOSIS — R29898 Other symptoms and signs involving the musculoskeletal system: Secondary | ICD-10-CM | POA: Diagnosis not present

## 2021-06-15 DIAGNOSIS — Z9181 History of falling: Secondary | ICD-10-CM | POA: Diagnosis not present

## 2021-06-15 DIAGNOSIS — M6281 Muscle weakness (generalized): Secondary | ICD-10-CM | POA: Diagnosis not present

## 2021-06-15 DIAGNOSIS — N3946 Mixed incontinence: Secondary | ICD-10-CM | POA: Diagnosis not present

## 2021-06-15 DIAGNOSIS — M5459 Other low back pain: Secondary | ICD-10-CM | POA: Diagnosis not present

## 2021-06-16 DIAGNOSIS — M6281 Muscle weakness (generalized): Secondary | ICD-10-CM | POA: Diagnosis not present

## 2021-06-16 DIAGNOSIS — R2681 Unsteadiness on feet: Secondary | ICD-10-CM | POA: Diagnosis not present

## 2021-06-16 DIAGNOSIS — R29898 Other symptoms and signs involving the musculoskeletal system: Secondary | ICD-10-CM | POA: Diagnosis not present

## 2021-06-16 DIAGNOSIS — Z9181 History of falling: Secondary | ICD-10-CM | POA: Diagnosis not present

## 2021-06-16 DIAGNOSIS — M5459 Other low back pain: Secondary | ICD-10-CM | POA: Diagnosis not present

## 2021-06-16 DIAGNOSIS — N3946 Mixed incontinence: Secondary | ICD-10-CM | POA: Diagnosis not present

## 2021-06-16 NOTE — Pre-Procedure Instructions (Signed)
Attempted to call patient regarding procedure instructions.  Left voicemail on the following items: Arrival time 1100 Nothing to eat or drink after midnight No meds AM of procedure Responsible person to drive you home and stay with you for 24 hrs Wash with special soap night before and morning of procedure  

## 2021-06-17 ENCOUNTER — Ambulatory Visit (HOSPITAL_COMMUNITY)
Admission: RE | Admit: 2021-06-17 | Discharge: 2021-06-17 | Disposition: A | Discharge status: Home / Self Care | Payer: Medicare Other | Attending: Internal Medicine | Admitting: Internal Medicine

## 2021-06-17 ENCOUNTER — Ambulatory Visit (HOSPITAL_COMMUNITY)
Admission: RE | Disposition: A | Discharge status: Home / Self Care | Payer: Self-pay | Source: Home / Self Care | Attending: Internal Medicine

## 2021-06-17 ENCOUNTER — Telehealth: Payer: Self-pay | Admitting: *Deleted

## 2021-06-17 DIAGNOSIS — R609 Edema, unspecified: Secondary | ICD-10-CM | POA: Insufficient documentation

## 2021-06-17 DIAGNOSIS — I700 Atherosclerosis of aorta: Secondary | ICD-10-CM | POA: Diagnosis not present

## 2021-06-17 DIAGNOSIS — I495 Sick sinus syndrome: Secondary | ICD-10-CM | POA: Insufficient documentation

## 2021-06-17 DIAGNOSIS — Z45010 Encounter for checking and testing of cardiac pacemaker pulse generator [battery]: Secondary | ICD-10-CM | POA: Diagnosis not present

## 2021-06-17 DIAGNOSIS — Z950 Presence of cardiac pacemaker: Secondary | ICD-10-CM

## 2021-06-17 DIAGNOSIS — I4891 Unspecified atrial fibrillation: Secondary | ICD-10-CM | POA: Insufficient documentation

## 2021-06-17 DIAGNOSIS — I442 Atrioventricular block, complete: Secondary | ICD-10-CM | POA: Diagnosis not present

## 2021-06-17 HISTORY — PX: PPM GENERATOR CHANGEOUT: EP1233

## 2021-06-17 LAB — BASIC METABOLIC PANEL
Anion gap: 7 (ref 5–15)
BUN: 20 mg/dL (ref 8–23)
CO2: 32 mmol/L (ref 22–32)
Calcium: 9.5 mg/dL (ref 8.9–10.3)
Chloride: 101 mmol/L (ref 98–111)
Creatinine, Ser: 0.67 mg/dL (ref 0.44–1.00)
GFR, Estimated: >60 mL/min (ref >60)
Glucose, Bld: 116 mg/dL — ABNORMAL HIGH (ref 70–99)
Potassium: 3.3 mmol/L — ABNORMAL LOW (ref 3.5–5.1)
Sodium: 140 mmol/L (ref 135–145)

## 2021-06-17 LAB — CBC
HCT: 41.2 % (ref 36.0–46.0)
Hemoglobin: 13.1 g/dL (ref 12.0–15.0)
MCH: 33.6 pg (ref 26.0–34.0)
MCHC: 31.8 g/dL (ref 30.0–36.0)
MCV: 105.6 fL — ABNORMAL HIGH (ref 80.0–100.0)
Platelets: 145 10*3/uL — ABNORMAL LOW (ref 150–400)
RBC: 3.9 MIL/uL (ref 3.87–5.11)
RDW: 14.8 % (ref 11.5–15.5)
WBC: 7.8 10*3/uL (ref 4.0–10.5)
nRBC: 0 % (ref 0.0–0.2)

## 2021-06-17 SURGERY — PPM GENERATOR CHANGEOUT

## 2021-06-17 MED ORDER — SODIUM CHLORIDE 0.9 % IV SOLN: INTRAVENOUS | Status: DC

## 2021-06-17 MED ORDER — LIDOCAINE HCL (PF) 1 % IJ SOLN
INTRAMUSCULAR | Status: DC | PRN
  Administered 2021-06-17: 60 mL

## 2021-06-17 MED ORDER — SODIUM CHLORIDE 0.9 % IV SOLN
80.0000 mg | INTRAVENOUS | Status: AC
  Administered 2021-06-17: 80 mg

## 2021-06-17 MED ORDER — CEFAZOLIN SODIUM-DEXTROSE 2-4 GM/100ML-% IV SOLN
2.0000 g | INTRAVENOUS | Status: AC
  Administered 2021-06-17: 2 g via INTRAVENOUS

## 2021-06-17 MED ORDER — POTASSIUM CHLORIDE 10 MEQ/100ML IV SOLN
INTRAVENOUS | Status: AC
  Filled 2021-06-17: qty 100

## 2021-06-17 MED ORDER — ACETAMINOPHEN 325 MG PO TABS: 325.0000 mg | ORAL_TABLET | ORAL | Status: DC | PRN

## 2021-06-17 MED ORDER — SODIUM CHLORIDE 0.9 % IV SOLN
INTRAVENOUS | Status: AC
  Filled 2021-06-17: qty 2

## 2021-06-17 MED ORDER — CEFAZOLIN SODIUM-DEXTROSE 2-4 GM/100ML-% IV SOLN
INTRAVENOUS | Status: AC
  Filled 2021-06-17: qty 100

## 2021-06-17 MED ORDER — POTASSIUM CHLORIDE 10 MEQ/100ML IV SOLN
10.0000 meq | Freq: Once | INTRAVENOUS | Status: AC
  Administered 2021-06-17: 10 meq via INTRAVENOUS

## 2021-06-17 SURGICAL SUPPLY — 6 items
CABLE SURGICAL S-101-97-12 (CABLE) ×2 IMPLANT
IPG PACE AZUR XT DR MRI W1DR01 (Pacemaker) ×1 IMPLANT
PACE AZURE XT DR MRI W1DR01 (Pacemaker) ×2 IMPLANT
PAD DEFIB RADIO PHYSIO CONN (PAD) ×2 IMPLANT
POUCH AIGIS-R ANTIBACT PPM (Mesh General) ×2 IMPLANT
TRAY PACEMAKER INSERTION (PACKS) ×2 IMPLANT

## 2021-06-17 NOTE — H&P (Signed)
Patient Care Team: Jill Dad, MD as PCP - General (Internal Medicine) Jill Sprang, MD as PCP - Electrophysiology (Cardiology) [place], [facility] Jill Sprang, MD as Consulting Physician (Cardiology) Jill Seal, MD as Attending Physician (Urology) Jill, Armando Reichert, MD as Consulting Physician (Pulmonary Disease) Jill Isaac, MD (Inactive) as Consulting Physician (Cardiothoracic Surgery) Jill Pole, MD as Consulting Physician (Gastroenterology) Jill, Eustace Quail, DO as Consulting Physician (Neurology)   HPI  [name] is a 87 y.o. female admitted for pacemaker generator replacement.  Initially implanted 2000 for intermittent complete heart block and syncope. Gen change 2009 DATE TEST EF    4/21 Echo  55 - 60 % No aortic stenosis    Date Cr K Hgb  4/22 0.74 4.0 13.6  9/22 0.7 3.9 11.6    Hx of achalasia with multiple procedures   Lots of back pain   Records and Results Reviewed   Past Medical History:  Diagnosis Date   Achalasia    Anemia    Arthritis    back   Chronic steroid use    Complete heart block ([place])    S/P PACEMAKER 2000 W/ GENERATOR CHANGE 2009   Empyema, right ([place]) PULMOLOGIST-- DR Jill   VATS 06/23/2012 cultures negative to date CXR 07/19/12 persistent airfluid levels/  CXR 11-01-2012 IMPROVE RIGHT PLEURAL EFFUSION   GERD (gastroesophageal reflux disease)    History of aspiration pneumonitis    DEC 2013   History of hiatal hernia    Hypertension    Inguinal hernia    right   Intrinsic urethral sphincter deficiency    Megaesophagus    Mixed stress and urge urinary incontinence    Multinodular thyroid 06/26/2012   Multi nodular goiter. Large nodules in both lobes of the gland.  These nodules fit national criteria for fine needle aspiration  biopsy if not previously assessed.     OSA on CPAP    cpap, doees not know settings   Polymyalgia rheumatica ([place])    on chronic Prednisone 5mg  daily   RBBB    S/P  dilatation of esophageal stricture     Past Surgical History:  Procedure Laterality Date   APPENDECTOMY  1953   w/ removal benign kidney tumor    BALLOON DILATION  07/24/2012   Procedure: BALLOON DILATION;  Surgeon: Inda Castle, MD;  Location: [facility];  Service: Endoscopy;  Laterality: N/A;   BALLOON DILATION N/A 12/03/2015   Procedure: BALLOON DILATION;  Surgeon: Jill Pole, MD;  Location: [place];  Service: Endoscopy;  Laterality: N/A;  pnuematic balloon   BALLOON DILATION N/A 03/16/2017   Procedure: BALLOON DILATION;  Surgeon: Jill Pole, MD;  Location: [facility];  Service: Endoscopy;  Laterality: N/A;  PNUEMATIC BALLOONS   BOTOX INJECTION  08/07/2012   Procedure: BOTOX INJECTION;  Surgeon: Inda Castle, MD;  Location: [facility];  Service: Endoscopy;  Laterality: N/A;   BOTOX INJECTION N/A 05/24/2013   Procedure: MACROPLASTIQUE IMPLANT;  Surgeon: Jill Seal, MD;  Location[facility];  Service: Urology;  Laterality: N/A;   BOTOX INJECTION  02/25/2014   Procedure: BOTOX INJECTION;  Surgeon: Inda Castle, MD;  Location: [facility];  Service: Endoscopy;;   CARDIAC PACEMAKER PLACEMENT  06/1999  DR RUTH GREENFIELD AT [facility]  ( LAST PACER CHECK 05-09-2013) for CHB/   END-OF-LIFE GENERATOR CHANGE  2009   CATARACT EXTRACTION W/ INTRAOCULAR LENS  IMPLANT, BILATERAL  2005   DILATION AND CURETTAGE OF UTERUS     ESOPHAGOGASTRODUODENOSCOPY  07/24/2012   Procedure: ESOPHAGOGASTRODUODENOSCOPY (EGD);  Surgeon: Inda Castle, MD;  Location: [facility];  Service: Endoscopy;  Laterality: N/A;   ESOPHAGOGASTRODUODENOSCOPY  08/07/2012   Procedure: ESOPHAGOGASTRODUODENOSCOPY (EGD);  Surgeon: Inda Castle, MD;  Location: [facility];  Service: Endoscopy;  Laterality: N/A;   ESOPHAGOGASTRODUODENOSCOPY N/A 02/25/2014   Procedure: ESOPHAGOGASTRODUODENOSCOPY (EGD);  Surgeon: Inda Castle, MD;  Location: [facility];  Service: Endoscopy;   Laterality: N/A;   ESOPHAGOGASTRODUODENOSCOPY N/A 03/16/2017   Procedure: ESOPHAGOGASTRODUODENOSCOPY (EGD);  Surgeon: Jill Pole, MD;  Location: [facility];  Service: Endoscopy;  Laterality: N/A;   ESOPHAGOGASTRODUODENOSCOPY N/A 08/02/2018   Procedure: ESOPHAGOGASTRODUODENOSCOPY (EGD);  Surgeon: Jill Pole, MD;  Location: [facility];  Service: Endoscopy;  Laterality: N/A;   ESOPHAGOGASTRODUODENOSCOPY (EGD) WITH ESOPHAGEAL DILATION  06/27/2012   Procedure: ESOPHAGOGASTRODUODENOSCOPY (EGD) WITH ESOPHAGEAL DILATION;  Surgeon: Ladene Artist, MD,FACG;  Location: [facility];  Service: Endoscopy;  Laterality: N/A;   ESOPHAGOGASTRODUODENOSCOPY (EGD) WITH PROPOFOL N/A 10/09/2015   Procedure: ESOPHAGOGASTRODUODENOSCOPY (EGD) WITH PROPOFOL ( WITH BOTOX);  Surgeon: Milus Banister, MD;  Location: [facility];  Service: Endoscopy;  Laterality: N/A;   ESOPHAGOGASTRODUODENOSCOPY (EGD) WITH PROPOFOL N/A 12/03/2015   Procedure: ESOPHAGOGASTRODUODENOSCOPY (EGD) WITH PROPOFOL;  Surgeon: Jill Pole, MD;  Location: [facility];  Service: Endoscopy;  Laterality: N/A;   FOREIGN BODY REMOVAL  08/02/2018   Procedure: FOREIGN BODY REMOVAL;  Surgeon: Jill Pole, MD;  Location: [facility];  Service: Endoscopy;;   PACEMAKER GENERATOR CHANGE  12/13/2007   at [place]  2013   left 3rd toe HAMMERTOE REPAIR   TONSILLECTOMY  AS CHILD   TOTAL HIP ARTHROPLASTY Right 2001   VERICOSE VEIN LIGATION     VIDEO ASSISTED THORACOSCOPY (VATS)/DECORTICATION  06/23/2012   Procedure: VIDEO ASSISTED THORACOSCOPY (VATS)/DECORTICATION;  Surgeon: Jill Isaac, MD;  Location: [facility];  Service: Thoracic;  Laterality: Right;   Progress (VATS)/EMPYEMA     06/23/2012   VIDEO BRONCHOSCOPY  06/23/2012   Procedure: VIDEO BRONCHOSCOPY;  Surgeon: Jill Isaac, MD;  Location: [place];  Service: Thoracic;  Laterality: N/A;    Current Facility-Administered Medications   Medication Dose Route Frequency Provider Last Rate Last Admin   0.9 %  sodium chloride infusion   Intravenous Continuous Jill Sprang, MD 50 mL/hr at 06/17/21 1149 New Bag at 06/17/21 1149   ceFAZolin (ANCEF) IVPB 2g/100 mL premix  2 g Intravenous On Call Jill Sprang, MD       gentamicin (GARAMYCIN) 80 mg in sodium chloride 0.9 % 500 mL irrigation  80 mg Irrigation On Call Jill Sprang, MD        Allergies  Allergen Reactions   Actonel [Risedronate Sodium] Other (See Comments)    Joint aches; rechallenged --caused joint aches   Ivp Dye [Iodinated Diagnostic Agents] Nausea And Vomiting      Social History   Tobacco Use   Smoking status: Never    Passive exposure: Yes   Smokeless tobacco: Never   Tobacco comments:    Exposure through father only.  Vaping Use   Vaping Use: Never used  Substance Use Topics   Alcohol use: Yes    Comment: occ   Drug use: No     Family History  Problem Relation Age of Onset   Arthritis Father    Heart disease Father    Cancer Brother        ?  spine   Heart disease Brother    Lung cancer Brother    Heart disease Mother        Congestive heart failure   Heart disease Brother    Arthritis Brother    Hypertension Brother    Colon cancer Neg Hx      Current Meds  Medication Sig   acetaminophen (TYLENOL) 325 MG tablet Take 650 mg by mouth 3 (three) times daily.   acetaminophen (TYLENOL) 500 MG tablet Take 650 mg by mouth every 6 (six) hours as needed.   Calcium Carbonate 500 MG CHEW Chew 1,000 mg by mouth daily.   cholecalciferol (VITAMIN D3) 25 MCG (1000 UNIT) tablet Take 1,000 Units by mouth daily.   Collagen-Boron-Hyaluronic Acid ([employer] JOINT HEALTH TRIPLE ACTION PO) Take 1 tablet by mouth daily.   denosumab (PROLIA) 60 MG/ML SOSY injection Inject 60 mg into the skin every 6 (six) months.   famotidine (PEPCID) 20 MG tablet Take 1 tablet (20 mg total) by mouth daily.   HYDROcodone-acetaminophen (NORCO/VICODIN) 5-325 MG tablet Take  0.5 tablets by mouth daily.   Iron-Vitamin C 65-125 MG TABS Take 1 tablet by mouth in the morning.   Multiple Vitamins-Minerals (MULTIVITAMIN WITH MINERALS) tablet Take 1 tablet by mouth daily.   Multiple Vitamins-Minerals (OCUVITE EYE + MULTI PO) Take 1 tablet by mouth daily.    NON FORMULARY Take 900 mg by mouth daily. Heal and Soothe capsule; 300 mg; amt: 900 mg; oral Once A Day   potassium chloride (KLOR-CON M) 10 MEQ tablet Take 20 mEq by mouth 3 (three) times daily.   predniSONE (DELTASONE) 10 MG tablet Take 10 mg by mouth daily.     Review of Systems negative except from HPI and PMH  Physical Exam BP (!) 146/74   Pulse 92   Temp 98.3 F (36.8 C) (Oral)   Ht 5' (1.524 m)   Wt 68 kg   SpO2 95%   BMI 29.29 kg/m  Well developed and well nourished in no acute distress HENT normal E scleral and icterus clear Neck Supple JVP flat; carotids brisk and full Clear to ausculation Regular rate and rhythm, no murmurs gallops or rub Soft with active bowel sounds No clubbing cyanosis  Edema Alert and oriented, grossly normal motor and sensory function Skin Warm and Dry    Assessment and  Plan Complete Heart Block-intermittent   Sick Sinus syndrome    Atrial fibrillation-subclinical   Edema   Aortic sclerosis   Pacemaker [employer]onic   \  For device generator replacement  Will do without conscious sedation  We have reviewed the benefits and risks of generator replacement.  These include but are not limited to lead fracture and infection.  The patient understands, agrees and is willing to proceed.

## 2021-06-17 NOTE — Telephone Encounter (Signed)
Let her know that Man[facility] can write a order when she is there tomorrow in [place]. Can also increase her Norco tomorrow to help her Pain Thanks

## 2021-06-17 NOTE — Telephone Encounter (Signed)
Mariette, daughter called and stated that patient has been having back pain for a couple of months now. Stated that she had a Lumbar X-Ray that showed Arthritis.   Patient is no better and no worse but is complaining of pelvic pain along with the back pain now.   Daughter is requesting a Spine and Pelvic X-Ray to be done to see what is going on.   Daughter stated that something needs to be done because her Mobility is affected by this pain and the pain is not controlled.   Please Advise.

## 2021-06-17 NOTE — Telephone Encounter (Signed)
Patient daughter notified and agreed. Will wait for [facility] to write order and change Norco dosage tomorrow.

## 2021-06-18 ENCOUNTER — Encounter (HOSPITAL_COMMUNITY): Payer: Self-pay | Admitting: Internal Medicine

## 2021-06-18 MED FILL — Cefazolin Sodium-Dextrose IV Solution 2 GM/100ML-4%: INTRAVENOUS | Qty: 100 | Status: AC

## 2021-06-18 NOTE — Telephone Encounter (Signed)
Mast, Man X, NP  You; [name], MD 33 minutes ago (9:44 AM)   Increased Norco dose, ordered X-ray Lumbar spine, pelvis. Thanks.

## 2021-06-19 ENCOUNTER — Telehealth: Payer: Self-pay

## 2021-06-19 ENCOUNTER — Non-Acute Institutional Stay: Payer: Medicare Other | Admitting: Nurse Practitioner

## 2021-06-19 ENCOUNTER — Telehealth: Payer: Self-pay | Admitting: *Deleted

## 2021-06-19 DIAGNOSIS — E876 Hypokalemia: Secondary | ICD-10-CM | POA: Diagnosis not present

## 2021-06-19 DIAGNOSIS — K220 Achalasia of cardia: Secondary | ICD-10-CM

## 2021-06-19 DIAGNOSIS — I5032 Chronic diastolic (congestive) heart failure: Secondary | ICD-10-CM | POA: Diagnosis not present

## 2021-06-19 DIAGNOSIS — D696 Thrombocytopenia, unspecified: Secondary | ICD-10-CM | POA: Diagnosis not present

## 2021-06-19 DIAGNOSIS — K5901 Slow transit constipation: Secondary | ICD-10-CM

## 2021-06-19 DIAGNOSIS — R102 Pelvic and perineal pain: Secondary | ICD-10-CM | POA: Diagnosis not present

## 2021-06-19 DIAGNOSIS — Z950 Presence of cardiac pacemaker: Secondary | ICD-10-CM

## 2021-06-19 DIAGNOSIS — R2689 Other abnormalities of gait and mobility: Secondary | ICD-10-CM

## 2021-06-19 DIAGNOSIS — R441 Visual hallucinations: Secondary | ICD-10-CM

## 2021-06-19 DIAGNOSIS — M810 Age-related osteoporosis without current pathological fracture: Secondary | ICD-10-CM

## 2021-06-19 DIAGNOSIS — G4733 Obstructive sleep apnea (adult) (pediatric): Secondary | ICD-10-CM | POA: Diagnosis not present

## 2021-06-19 DIAGNOSIS — F419 Anxiety disorder, unspecified: Secondary | ICD-10-CM

## 2021-06-19 DIAGNOSIS — M545 Low back pain, unspecified: Secondary | ICD-10-CM | POA: Diagnosis not present

## 2021-06-19 DIAGNOSIS — F4321 Adjustment disorder with depressed mood: Secondary | ICD-10-CM | POA: Diagnosis not present

## 2021-06-19 DIAGNOSIS — M5136 Other intervertebral disc degeneration, lumbar region: Secondary | ICD-10-CM | POA: Diagnosis not present

## 2021-06-19 DIAGNOSIS — M353 Polymyalgia rheumatica: Secondary | ICD-10-CM | POA: Diagnosis not present

## 2021-06-19 DIAGNOSIS — G8929 Other chronic pain: Secondary | ICD-10-CM

## 2021-06-19 LAB — MYASTHENIA GRAVIS PANEL 1
A CHR BINDING ABS: <0.3 nmol/L
STRIATED MUSCLE AB SCREEN: NEGATIVE
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
## 2021-06-19 NOTE — Telephone Encounter (Signed)
Daughter called and left message on voicemail: Laketa Sandoz called with a hostile tone stating she is frustrated that no one is addressing her mothers pain. Patient is in severe pain limiting her mobility. Mariette wants her mother to have a CT of her back and wants someone to call her to discuss this ASAP. Magdalen Spatz is upset that no one is communicating with Dr.Gupta.  This message will be forwarded to the facility providers to follow-up directly with patients daughter or to have facility staff follow-up with Mariette.

## 2021-06-19 NOTE — Assessment & Plan Note (Signed)
Dysphagia diet/achalasia of esophagus, s/p dilation, on Pepcid, Pureed diet

## 2021-06-19 NOTE — Telephone Encounter (Signed)
Magdalen Spatz, daughter called and stated that she is upset that her mother is in so much pain. Stated that she needs 24 hour pain medication to ease her pains. Stated that she does not know what is going on with patient but she should not sit around in pain. Stated that she wakes up every morning around 3am in pain.   Daughter is requesting for someone to go see her.  Stated that she does not know what ManXie increased her pain medication to but it doesn't show in chart.   Daughter also stated that she hasn't heard that she has even went for x-ray yet either.   Stated that patient has a CT Scan scheduled for 07/15/2021 @ 2:00  Daughter is requesting someone to see patient and do something to help ease the pain.  Daughter is also going to call the facility to speak with patient's nurse.

## 2021-06-19 NOTE — Assessment & Plan Note (Signed)
takes Prolia. T score -2.9 2018

## 2021-06-19 NOTE — Assessment & Plan Note (Addendum)
c/o pain at end of spine with certain way she moves in patient's own wards. No pain while the patient sits in w/c and when the area was palpated. Denied pain in legs or hips. X-ray pelvis/lumbar spine pending. Norco was increased to q8hrs 06/18/21, the patient stated it is effective and declined increasing again to q6hr-the patient's daughter desires to increase Norco 5x/day.    Bilateral lower back pain, mostly in sacral region, positional/movement associated, Norco for pain  06/19/21 X-ray pelvis no acute osseous abnormality.

## 2021-06-19 NOTE — Assessment & Plan Note (Signed)
ICD implant 06/17/21, left upper chest incision intact under clear dressing.

## 2021-06-19 NOTE — Assessment & Plan Note (Signed)
takes MiraLax qd.   

## 2021-06-19 NOTE — Assessment & Plan Note (Signed)
plt 145 06/17/21
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
## 2021-06-19 NOTE — Assessment & Plan Note (Signed)
CPAP/O2 at night, underwent sleep study.

## 2021-06-19 NOTE — Telephone Encounter (Signed)
Mast, Man X, NP  You 20 minutes ago (3:44 PM)   I will see her around 4pm, thanks.      Patient daughter notified that Lennie Odor was going to see patient today.

## 2021-06-19 NOTE — Assessment & Plan Note (Addendum)
CHF, minimal edema BLE, BNP 122 in the past, echocardiogram  EF 33-74%, grade I diastolic dysfunction.  CXR bilateral effusions, on Torsemide. F/u cardiology. Bun/creat 20/0.67 06/17/21. K 3.3 12/7/2, repeat BMP.

## 2021-06-19 NOTE — Assessment & Plan Note (Signed)
Hx of PMR saw rheumatology, on Prednisone 45m qd. ESR 41. CRP 22.7 03/18/21

## 2021-06-19 NOTE — Assessment & Plan Note (Signed)
,   on Kcl, K 3.3 06/17/21

## 2021-06-22 ENCOUNTER — Encounter: Payer: Self-pay | Admitting: Nurse Practitioner

## 2021-06-22 ENCOUNTER — Telehealth: Payer: Self-pay | Admitting: *Deleted

## 2021-06-22 DIAGNOSIS — R2689 Other abnormalities of gait and mobility: Secondary | ICD-10-CM | POA: Insufficient documentation

## 2021-06-22 NOTE — Assessment & Plan Note (Signed)
Psych recommendation: Sertraline 25mg  qd if HPOA consentt.

## 2021-06-22 NOTE — Telephone Encounter (Signed)
Patient daughter, Magdalen Spatz called and stated that she just wanted to let you know that patient's Potassium was 3.3 last Wednesday in the hospital. Stated that she was not sure if you were aware and if this could be causing any of the problems patient is having or if she is not getting enough potassium.   She just wanted to make you aware.   FYI

## 2021-06-22 NOTE — Assessment & Plan Note (Signed)
The patient needs skilled nursing facility level of care due to her declining in ADL function.

## 2021-06-22 NOTE — Telephone Encounter (Signed)
Mast, Man X, NP  You 4 minutes ago (11:02 AM)   F/u BMP/K ordered in am. Thanks.      Patient daughter notified.

## 2021-06-23 ENCOUNTER — Telehealth: Payer: Self-pay | Admitting: *Deleted

## 2021-06-23 DIAGNOSIS — E860 Dehydration: Secondary | ICD-10-CM | POA: Diagnosis not present

## 2021-06-23 NOTE — Telephone Encounter (Signed)
Patient daughter, [name] called and stated that she just wanted to let you know that patient is responding well to the round the clock Vicodin. Stated that patient is sleeping better, more alert and color look better. Patient is able to move around much better. Daughter stated that the pain is better controlled.   FYI

## 2021-06-24 ENCOUNTER — Telehealth: Payer: Self-pay | Admitting: *Deleted

## 2021-06-24 ENCOUNTER — Ambulatory Visit: Payer: Medicare Other

## 2021-06-24 NOTE — Telephone Encounter (Signed)
Patient daughter called and wanted:  Confirmation that Dr. Lyndel Safe was still going to be patient's PCP at [facility]  2.   Daughter not sure what the X-Ray's show,     however she thought there was a CT of Spine ordered and apparently that is probably going to be a CT of the Head ordered by the Neurologist.   Stated regardless of what The X-Ray shows, she still asks that a CT Spine/Pelvis be ordered also.    Please Advise.   Magdalen Spatz, daughter [phone]

## 2021-06-24 NOTE — Telephone Encounter (Signed)
Yes I will be there tomorrow in [facility]. And I will be her PCP. I will talk to her about the Xray and CT scan tomorrow when I am there in [place]. Thanks

## 2021-06-24 NOTE — Telephone Encounter (Signed)
Patient daughter notified and agreed.  Stated that she will be there tomorrow.

## 2021-06-25 ENCOUNTER — Non-Acute Institutional Stay (SKILLED_NURSING_FACILITY): Payer: Medicare Other | Admitting: Internal Medicine

## 2021-06-25 ENCOUNTER — Encounter: Payer: Self-pay | Admitting: Internal Medicine

## 2021-06-25 DIAGNOSIS — G4733 Obstructive sleep apnea (adult) (pediatric): Secondary | ICD-10-CM | POA: Diagnosis not present

## 2021-06-25 DIAGNOSIS — I5032 Chronic diastolic (congestive) heart failure: Secondary | ICD-10-CM | POA: Diagnosis not present

## 2021-06-25 DIAGNOSIS — Z950 Presence of cardiac pacemaker: Secondary | ICD-10-CM | POA: Diagnosis not present

## 2021-06-25 DIAGNOSIS — M545 Low back pain, unspecified: Secondary | ICD-10-CM | POA: Diagnosis not present

## 2021-06-25 DIAGNOSIS — M353 Polymyalgia rheumatica: Secondary | ICD-10-CM | POA: Diagnosis not present

## 2021-06-25 DIAGNOSIS — D696 Thrombocytopenia, unspecified: Secondary | ICD-10-CM | POA: Diagnosis not present

## 2021-06-25 DIAGNOSIS — R441 Visual hallucinations: Secondary | ICD-10-CM | POA: Diagnosis not present

## 2021-06-25 DIAGNOSIS — K220 Achalasia of cardia: Secondary | ICD-10-CM | POA: Diagnosis not present

## 2021-06-25 DIAGNOSIS — G8929 Other chronic pain: Secondary | ICD-10-CM | POA: Diagnosis not present

## 2021-06-25 NOTE — Progress Notes (Signed)
Provider:  Veleta Miners MD  Location:   [facility] Room Number: [id number] Place of Service:  SNF ([phone])  PCP: [name], MD Patient Care Team: [name], MD as PCP - General (Internal Medicine) [name], MD as PCP - Electrophysiology (Cardiology) [place], [facility] [name], MD as Consulting Physician (Cardiology) Irine Seal, MD as Attending Physician (Urology) Clance, Armando Reichert, MD as Consulting Physician (Pulmonary Disease) Grace Isaac, MD (Inactive) as Consulting Physician (Cardiothoracic Surgery) Mauri Pole, MD as Consulting Physician (Gastroenterology) Tat, Eustace Quail, DO as Consulting Physician (Neurology)  Extended Emergency Contact Information Primary Emergency Contact: Quizon,Dorothy Address: [address]          [place], [place] [address] Johnnette Litter of [place] Phone: [phone] Mobile Phone: [phone] Relation: Daughter Secondary Emergency Contact: [place], [place] [address] Johnnette Litter of [place] Phone: [phone] Mobile Phone: [phone] Relation: Daughter  Code Status: Full Code Goals of Care: Advanced Directive information Advanced Directives 06/25/2021  Does Patient Have a Medical Advance Directive? Yes  Type of Paramedic of [place];Living will;Out of facility DNR (pink MOST or yellow form)  Does patient want to make changes to medical advance directive? No - Patient declined  Copy of [place] in Chart? Yes - validated most recent copy scanned in chart (See row information)  Would patient like information on creating a medical advance directive? -  Pre-existing out of facility DNR order (yellow form or pink MOST form) Yellow form placed in chart (order not valid for inpatient use);Pink MOST form placed in chart (order not valid for inpatient use)      Chief Complaint  Patient presents with   New Admit To SNF    Admission to  SNF    HPI: Patient is a 87 y.o. female seen today for admission to SNF For therapy and Possible LTC  Patient has a history of PMR, esophageal dysphagia, diastolic CHF, s/p pacemaker, anemia, hallucinations visual, osteoporosis, compression fracture, OSA   Back pain Patient has been having back pain recently was started on Norco but her pain continued  x-ray multiple times have not shown any fracture just showed on degenerative changes.   Per patient and her daughter in the room the pain has significantly improved since now that she is on around-the-clock Norco.   Family wants to know if we can increase her Norco at night to help her sleep better.  She also wanted to discuss if patient needs CT scan cannot do MRI due to pacemaker  History of PMR controlled on prednisone has failed tapering Visual hallucinations Seen by Dr. Carles Collet  plan for CAT scan of head she does not think she has Parkinson.  Hallucinations related to prednisone History of dysphagia and poor appetite Has lost weight continues to eat pured diet due to esophageal dysphagia.  Recently has lost her husband but states she is not depressed just grieving  Over past few weeks since her husband passed away patient has been needing increased help with ADLs which assisted living was not able to provide her.  Patient also developed some abrasions in the back due to sitting in her chair.  Patient's daughter very upset because she thinks her mom is not getting good care.  [facility]  She is planning to move her to another facility near the place where she lives  Past Medical History:  Diagnosis Date  Achalasia    Anemia    Arthritis    back   Chronic steroid use    Complete heart block ([place])    S/P PACEMAKER 2000 W/ GENERATOR CHANGE 2009   Empyema, right ([place]) PULMOLOGIST-- DR CLANCE   VATS 06/23/2012 cultures negative to date CXR 07/19/12 persistent airfluid levels/  CXR 11-01-2012 IMPROVE RIGHT PLEURAL EFFUSION   GERD  (gastroesophageal reflux disease)    History of aspiration pneumonitis    DEC 2013   History of hiatal hernia    Hypertension    Inguinal hernia    right   Intrinsic urethral sphincter deficiency    Megaesophagus    Mixed stress and urge urinary incontinence    Multinodular thyroid 06/26/2012   Multi nodular goiter. Large nodules in both lobes of the gland.  These nodules fit national criteria for fine needle aspiration  biopsy if not previously assessed.     OSA on CPAP    cpap, doees not know settings   Polymyalgia rheumatica ([place])    on chronic Prednisone 5mg  daily   RBBB    S/P dilatation of esophageal stricture    Past Surgical History:  Procedure Laterality Date   APPENDECTOMY  1953   w/ removal benign kidney tumor    BALLOON DILATION  07/24/2012   Procedure: BALLOON DILATION;  Surgeon: Inda Castle, MD;  Location: [facility];  Service: Endoscopy;  Laterality: N/A;   BALLOON DILATION N/A 12/03/2015   Procedure: BALLOON DILATION;  Surgeon: Mauri Pole, MD;  Location: [facility];  Service: Endoscopy;  Laterality: N/A;  pnuematic balloon   BALLOON DILATION N/A 03/16/2017   Procedure: BALLOON DILATION;  Surgeon: Mauri Pole, MD;  Location: [facility];  Service: Endoscopy;  Laterality: N/A;  PNUEMATIC BALLOONS   BOTOX INJECTION  08/07/2012   Procedure: BOTOX INJECTION;  Surgeon: Inda Castle, MD;  [facility];  Service: Endoscopy;  Laterality: N/A;   BOTOX INJECTION N/A 05/24/2013   Procedure: MACROPLASTIQUE IMPLANT;  Surgeon: Irine Seal, MD;  Location: [facility];  Service: Urology;  Laterality: N/A;   BOTOX INJECTION  02/25/2014   Procedure: BOTOX INJECTION;  Surgeon: Inda Castle, MD;  [facility];  Service: Endoscopy;;   CARDIAC PACEMAKER PLACEMENT  06/1999  DR RUTH GREENFIELD AT [facility]  ( LAST PACER CHECK 05-09-2013) for CHB/   END-OF-LIFE GENERATOR CHANGE  2009   CATARACT EXTRACTION W/ INTRAOCULAR  LENS  IMPLANT, BILATERAL  2005   DILATION AND CURETTAGE OF UTERUS     ESOPHAGOGASTRODUODENOSCOPY  07/24/2012   Procedure: ESOPHAGOGASTRODUODENOSCOPY (EGD);  Surgeon: Inda Castle, MD;  Location: [facility];  Service: Endoscopy;  Laterality: N/A;   ESOPHAGOGASTRODUODENOSCOPY  08/07/2012   Procedure: ESOPHAGOGASTRODUODENOSCOPY (EGD);  Surgeon: Inda Castle, MD;  Location: [facility];  Service: Endoscopy;  Laterality: N/A;   ESOPHAGOGASTRODUODENOSCOPY N/A 02/25/2014   Procedure: ESOPHAGOGASTRODUODENOSCOPY (EGD);  Surgeon: Inda Castle, MD;  Location: [facility];  Service: Endoscopy;  Laterality: N/A;   ESOPHAGOGASTRODUODENOSCOPY N/A 03/16/2017   Procedure: ESOPHAGOGASTRODUODENOSCOPY (EGD);  Surgeon: Mauri Pole, MD;  Location[facility];  Service: Endoscopy;  Laterality: N/A;   ESOPHAGOGASTRODUODENOSCOPY N/A 08/02/2018   Procedure: ESOPHAGOGASTRODUODENOSCOPY (EGD);  Surgeon: Mauri Pole, MD;  Location: [facility];  Service: Endoscopy;  Laterality: N/A;   ESOPHAGOGASTRODUODENOSCOPY (EGD) WITH ESOPHAGEAL DILATION  06/27/2012   Procedure: ESOPHAGOGASTRODUODENOSCOPY (EGD) WITH ESOPHAGEAL DILATION;  Surgeon: Ladene Artist, MD,FACG;  Location: [facility];  Service: Endoscopy;  Laterality: N/A;  ESOPHAGOGASTRODUODENOSCOPY (EGD) WITH PROPOFOL N/A 10/09/2015   Procedure: ESOPHAGOGASTRODUODENOSCOPY (EGD) WITH PROPOFOL ( WITH BOTOX);  Surgeon: Milus Banister, MD;  Location: [facility];  Service: Endoscopy;  Laterality: N/A;   ESOPHAGOGASTRODUODENOSCOPY (EGD) WITH PROPOFOL N/A 12/03/2015   Procedure: ESOPHAGOGASTRODUODENOSCOPY (EGD) WITH PROPOFOL;  Surgeon: Mauri Pole, MD;  Location: [facility];  Service: Endoscopy;  Laterality: N/A;   FOREIGN BODY REMOVAL  08/02/2018   Procedure: FOREIGN BODY REMOVAL;  Surgeon: Mauri Pole, MD;  [facility];  Service: Endoscopy;;   PACEMAKER GENERATOR CHANGE  12/13/2007   at [facility] N/A  06/17/2021   Procedure: PPM GENERATOR CHANGEOUT;  Surgeon: [name], MD;  Location: [facility];  Service: Cardiovascular;  Laterality: N/A;   TOE SURGERY  2013   left 3rd toe HAMMERTOE REPAIR   TONSILLECTOMY  AS CHILD   TOTAL HIP ARTHROPLASTY Right 2001   VERICOSE VEIN LIGATION     VIDEO ASSISTED THORACOSCOPY (VATS)/DECORTICATION  06/23/2012   Procedure: VIDEO ASSISTED THORACOSCOPY (VATS)/DECORTICATION;  Surgeon: Grace Isaac, MD;  Location: [facility];  Service: Thoracic;  Laterality: Right;   VIDEO ASSISTED THORACOSCOPY (VATS)/EMPYEMA     06/23/2012   VIDEO BRONCHOSCOPY  06/23/2012   Procedure: VIDEO BRONCHOSCOPY;  Surgeon: Grace Isaac, MD;  Location: [facility];  Service: Thoracic;  Laterality: N/A;    reports that she has never smoked. She has been exposed to tobacco smoke. She has never used smokeless tobacco. She reports current alcohol use. She reports that she does not use drugs. Social History   Socioeconomic History   Marital status: Married    Spouse name: Not on file   Number of children: 9   Years of education: Not on file   Highest education level: Not on file  Occupational History   Occupation: Retired Adminstrated assistance  Tobacco Use   Smoking status: Never    Passive exposure: Yes   Smokeless tobacco: Never   Tobacco comments:    Exposure through father only.  Vaping Use   Vaping Use: Never used  Substance and Sexual Activity   Alcohol use: Yes    Comment: occ   Drug use: No   Sexual activity: Never    Comment: married  Other Topics Concern   Not on file  Social History Narrative   [facility] since [facility]/18/2012   Married   Pacemaker   POA   Never smoked   Alcohol-wine 2-3 nights a week    Exercise - exercise classes 3 days a week    [facility] Donation at [facility]      [facility]:   Originally from [place]. Has lived in [place], [place], [place], & moved to [place] in 1961. Previously worked doing  Web designer work. No pets currently. No bird exposure.    Social Determinants of Health   Financial Resource Strain: Not on file  Food Insecurity: Not on file  Transportation Needs: Not on file  Physical Activity: Not on file  Stress: Not on file  Social Connections: Not on file  Intimate Partner Violence: Not on file    Functional Status Survey:    Family History  Problem Relation Age of Onset   Arthritis Father    Heart disease Father    Cancer Brother        ?spine   Heart disease Brother    Lung cancer Brother    Heart disease Mother  Congestive heart failure   Heart disease Brother    Arthritis Brother    Hypertension Brother    Colon cancer Neg Hx     Health Maintenance  Topic Date Due   Zoster Vaccines- Shingrix (2 of 2) 04/03/2016   COVID-19 Vaccine (5 - Booster for Moderna series) 05/26/2021   TETANUS/TDAP  11/04/2025   Pneumonia Vaccine 66+ Years old  Completed   INFLUENZA VACCINE  Completed   DEXA SCAN  Completed   HPV VACCINES  Aged Out    Allergies  Allergen Reactions   Actonel [Risedronate Sodium] Other (See Comments)    Joint aches; rechallenged --caused joint aches   Ivp Dye [Iodinated Diagnostic Agents] Nausea And Vomiting    Allergies as of 06/25/2021       Reactions   Actonel [risedronate Sodium] Other (See Comments)   Joint aches; rechallenged --caused joint aches   Ivp Dye [iodinated Diagnostic Agents] Nausea And Vomiting        Medication List        Accurate as of June 25, 2021 11:59 PM. If you have any questions, ask your nurse or doctor.          acetaminophen 500 MG tablet Commonly known as: TYLENOL Take 500 mg by mouth every 6 (six) hours as needed.   acetaminophen 325 MG tablet Commonly known as: TYLENOL Take 650 mg by mouth 3 (three) times daily.   Calcium Carbonate 500 MG Chew Chew 1,000 mg by mouth daily.   cholecalciferol 25 MCG (1000 UNIT) tablet Commonly known as: VITAMIN  D3 Take 1,000 Units by mouth daily.   [employer] JOINT HEALTH TRIPLE ACTION PO Take 1 tablet by mouth daily.   famotidine 20 MG tablet Commonly known as: PEPCID Take 1 tablet (20 mg total) by mouth daily.   feeding supplement (PRO-STAT SUGAR FREE 64) Liqd Take 30 mLs by mouth 2 (two) times daily.   HYDROcodone-acetaminophen 5-325 MG tablet Commonly known as: NORCO/VICODIN Take 0.5 tablets by mouth daily. 9:00 PM   HYDROcodone-acetaminophen 5-325 MG tablet Commonly known as: NORCO/VICODIN Take 0.5 tablets by mouth in the morning, at noon, in the evening, and at bedtime. 03:00 AM, 07:00 AM, 12:00 PM, 04:00 PM   Iron-Vitamin C 65-125 MG Tabs Take 1 tablet by mouth in the morning.   Lidocaine 4 % Ptch Apply 1 patch topically in the morning and at bedtime.   NON FORMULARY Take 900 mg by mouth daily. Heal and Soothe capsule; 300 mg; amt: 900 mg; oral Once A Day   OCUVITE EYE + MULTI PO Take 1 tablet by mouth daily.   multivitamin with minerals tablet Take 1 tablet by mouth daily.   polyethylene glycol 17 g packet Commonly known as: MIRALAX / GLYCOLAX Take 17 g by mouth daily.   potassium chloride 10 MEQ tablet Commonly known as: KLOR-CON M Take 20 mEq by mouth 3 (three) times daily.   predniSONE 10 MG tablet Commonly known as: DELTASONE Take 10 mg by mouth daily.   torsemide 20 MG tablet Commonly known as: DEMADEX Take 20 mg by mouth daily.        Review of Systems  Constitutional:  Positive for activity change. Negative for appetite change.  HENT: Negative.    Respiratory:  Negative for cough and shortness of breath.   Cardiovascular:  Negative for leg swelling.  Gastrointestinal:  Negative for constipation.  Genitourinary: Negative.   Musculoskeletal:  Positive for back pain and gait problem. Negative for arthralgias and myalgias.  Skin:  Positive for color change.  Neurological:  Positive for weakness. Negative for dizziness.  Psychiatric/Behavioral:  Negative  for confusion, dysphoric mood and sleep disturbance.    Vitals:   06/28/21 1741  BP: 119/76  Pulse: 90  Resp: 14  Temp: (!) 96.9 F (36.1 C)  SpO2: 96%  Weight: 148 lb (67.1 kg)   Body mass index is 28.9 kg/m. Physical Exam Vitals reviewed.  Constitutional:      Appearance: Normal appearance.  HENT:     Head: Normocephalic.     Nose: Nose normal.     Mouth/Throat:     Mouth: Mucous membranes are moist.     Pharynx: Oropharynx is clear.  Eyes:     Pupils: Pupils are equal, round, and reactive to light.  Cardiovascular:     Rate and Rhythm: Normal rate and regular rhythm.     Pulses: Normal pulses.     Heart sounds: No murmur heard. Pulmonary:     Effort: Pulmonary effort is normal.     Breath sounds: Normal breath sounds.  Abdominal:     General: Abdomen is flat. Bowel sounds are normal.     Palpations: Abdomen is soft.  Musculoskeletal:        General: No swelling.     Cervical back: Neck supple.  Skin:    General: Skin is warm.  Neurological:     General: No focal deficit present.     Mental Status: She is alert and oriented to person, place, and time.  Psychiatric:        Mood and Affect: Mood normal.        Thought Content: Thought content normal.    Labs reviewed: Basic Metabolic Panel: Recent Labs    10/30/20 1203 03/03/21 0000 03/19/21 0000 05/11/21 1556 06/17/21 1144  NA 142   < > 144 142 140  K 4.0   < > 3.9   3.6 4.0 3.3*  CL 106   < > 104 99 101  CO2 27   < > 34* 29 32  GLUCOSE 127*  --   --  125* 116*  BUN 18   < > 23* 19 20  CREATININE 0.74   < > 0.7 0.75 0.67  CALCIUM 8.8*   < > 9.4 9.9 9.5   < > = values in this interval not displayed.   Liver Function Tests: Recent Labs    10/04/20 0000 03/03/21 0000 03/19/21 0000  AST 13 13 12*  ALT 9 10 7   ALKPHOS 48 54 70  ALBUMIN 3.5 3.3* 3.2*   No results for input(s): LIPASE, AMYLASE in the last 8760 hours. No results for input(s): AMMONIA in the last 8760 hours. CBC: Recent Labs     10/04/20 0000 10/30/20 1203 03/03/21 0000 03/19/21 0000 05/11/21 1556 06/17/21 1144  WBC 5.7 7.8 5.9 4.8 7.0 7.8  NEUTROABS 3,295.00  --  3,180.00 2,534.00  --   --   HGB 12.7 13.6 12.3 11.6* 12.6 13.1  HCT 38 42.9 36 35* 36.3 41.2  MCV  --  106.5*  --   --  96 105.6*  PLT 130* 124* 109* 118* 180 145*   Cardiac Enzymes: No results for input(s): CKTOTAL, CKMB, CKMBINDEX, TROPONINI in the last 8760 hours. BNP: Invalid input(s): POCBNP Lab Results  Component Value Date   HGBA1C 5.3 03/03/2021   Lab Results  Component Value Date   TSH 0.50 03/03/2021   Lab Results  Component Value Date   DVVOHYWV37 106  05/03/2019   No results found for: FOLATE Lab Results  Component Value Date   IRON 42 (L) 05/03/2019   TIBC 442 05/03/2019   FERRITIN 19 05/03/2019    Imaging and Procedures obtained prior to SNF admission: No results found.  Assessment/Plan 1. Chronic midline low back pain without sciatica Will increase Norco at 3 am to 1 tab Continue 1/2 tab rest of the days Will work with therapy Xray negative at this time D/w the daughter the benefit of doing CT scan at this time.  Patient is not interested in any kind of intervention.  In any goal is pain control and therapy. Dr. Has agreed and wants to wait  2. Visual hallucination CT scan of the head is pending following with Dr. Carles Collet  3. Chronic diastolic congestive heart failure (HCC) On low-dose of torsemide EF 55%  4. Polymyalgia rheumatica (HCC) On prednisone has failed taper  5. Achalasia of esophagus Has seen GI before Continues to tolerate pured diet  6. Obstructive sleep apnea syndrome On CPAP  7. Thrombocytopenia (HCC) Platelets are stable  8. Cardiac pacemaker in situ due to CHB  per Dr. Caryl Comes 9  anemia continue on iron and B12   Family/ staff Communication:   Labs/tests ordered:

## 2021-06-28 ENCOUNTER — Encounter: Payer: Self-pay | Admitting: Internal Medicine

## 2021-06-29 ENCOUNTER — Encounter: Payer: Self-pay | Admitting: Internal Medicine

## 2021-07-01 ENCOUNTER — Ambulatory Visit (INDEPENDENT_AMBULATORY_CARE_PROVIDER_SITE_OTHER): Payer: Medicare Other | Admitting: Student

## 2021-07-01 ENCOUNTER — Other Ambulatory Visit: Payer: Self-pay

## 2021-07-01 DIAGNOSIS — I495 Sick sinus syndrome: Secondary | ICD-10-CM | POA: Diagnosis not present

## 2021-07-01 LAB — CUP PACEART INCLINIC DEVICE CHECK
Battery Remaining Longevity: 182 mo
Battery Voltage: 3.22 V
Brady Statistic AP VP Percent: 0.03 %
Brady Statistic AP VS Percent: 10.2 %
Brady Statistic AS VP Percent: 0.04 %
Brady Statistic AS VS Percent: 89.72 %
Brady Statistic RA Percent Paced: 10.59 %
Brady Statistic RV Percent Paced: 0.08 %
Date Time Interrogation Session: 20221221124903
Implantable Lead Implant Date: 20001227
Implantable Lead Implant Date: 20001227
Implantable Lead Location: 753859
Implantable Lead Location: 753860
Implantable Lead Model: 5076
Implantable Lead Model: 5076
Implantable Pulse Generator Implant Date: 20221207
Lead Channel Impedance Value: 323 Ohm
Lead Channel Impedance Value: 437 Ohm
Lead Channel Impedance Value: 570 Ohm
Lead Channel Impedance Value: 646 Ohm
Lead Channel Pacing Threshold Amplitude: 0.5 V
Lead Channel Pacing Threshold Amplitude: 0.625 V
Lead Channel Pacing Threshold Pulse Width: 0.4 ms
Lead Channel Pacing Threshold Pulse Width: 0.4 ms
Lead Channel Sensing Intrinsic Amplitude: 12.5 mV
Lead Channel Sensing Intrinsic Amplitude: 14.375 mV
Lead Channel Sensing Intrinsic Amplitude: 2 mV
Lead Channel Sensing Intrinsic Amplitude: 2.75 mV
Lead Channel Setting Pacing Amplitude: 1.5 V
Lead Channel Setting Pacing Amplitude: 2 V
Lead Channel Setting Pacing Pulse Width: 0.4 ms
Lead Channel Setting Sensing Sensitivity: 4 mV

## 2021-07-01 NOTE — Progress Notes (Signed)
Wound check appointment. Remaining dermabond removed. Wound without redness or edema. Incision edges approximated, wound well healed. Normal device function. Thresholds, sensing, and impedances consistent with implant measurements. Device programmed at appropriate chronic safety margin. Histogram distribution appropriate for patient and level of activity. No mode switches or high ventricular rates noted. Patient educated about wound care, arm mobility, lifting restrictions. ROV in 3 months with Dr. Caryl Comes

## 2021-07-01 NOTE — Patient Instructions (Signed)

## 2021-07-02 ENCOUNTER — Telehealth: Payer: Self-pay | Admitting: *Deleted

## 2021-07-02 NOTE — Telephone Encounter (Signed)
Patient daughter, Magdalen Spatz called and stated that she forgot to ask if an Order could be written for patient to have the head of her bed raised up. Stated that patient's pillow's are in the closet. Stated that patient needs the head of the bed raised.   Requesting you to give an order to [facility].

## 2021-07-06 ENCOUNTER — Encounter: Payer: Self-pay | Admitting: Internal Medicine

## 2021-07-08 ENCOUNTER — Encounter: Payer: Self-pay | Admitting: Orthopedic Surgery

## 2021-07-08 ENCOUNTER — Non-Acute Institutional Stay (SKILLED_NURSING_FACILITY): Payer: Medicare Other | Admitting: Orthopedic Surgery

## 2021-07-08 DIAGNOSIS — R441 Visual hallucinations: Secondary | ICD-10-CM

## 2021-07-08 DIAGNOSIS — G8929 Other chronic pain: Secondary | ICD-10-CM | POA: Diagnosis not present

## 2021-07-08 DIAGNOSIS — K219 Gastro-esophageal reflux disease without esophagitis: Secondary | ICD-10-CM

## 2021-07-08 DIAGNOSIS — M545 Low back pain, unspecified: Secondary | ICD-10-CM

## 2021-07-08 DIAGNOSIS — K220 Achalasia of cardia: Secondary | ICD-10-CM | POA: Diagnosis not present

## 2021-07-08 DIAGNOSIS — I700 Atherosclerosis of aorta: Secondary | ICD-10-CM | POA: Diagnosis not present

## 2021-07-08 DIAGNOSIS — Z950 Presence of cardiac pacemaker: Secondary | ICD-10-CM | POA: Diagnosis not present

## 2021-07-08 DIAGNOSIS — M353 Polymyalgia rheumatica: Secondary | ICD-10-CM

## 2021-07-08 DIAGNOSIS — K5901 Slow transit constipation: Secondary | ICD-10-CM | POA: Diagnosis not present

## 2021-07-08 DIAGNOSIS — G4733 Obstructive sleep apnea (adult) (pediatric): Secondary | ICD-10-CM

## 2021-07-08 DIAGNOSIS — I5032 Chronic diastolic (congestive) heart failure: Secondary | ICD-10-CM

## 2021-07-08 NOTE — Progress Notes (Signed)
Location:   [facility] Room Number: 36 Place of Service:  SNF ([phone]) Provider:  Windell Moulding, NP  [name], MD  Patient Care Team: [name], MD as PCP - General (Internal Medicine) [name], MD as PCP - Electrophysiology (Cardiology) Biehle, [facility] [name], MD as Consulting Physician (Cardiology) Irine Seal, MD as Attending Physician (Urology) Clance, Armando Reichert, MD as Consulting Physician (Pulmonary Disease) Grace Isaac, MD (Inactive) as Consulting Physician (Cardiothoracic Surgery) Mauri Pole, MD as Consulting Physician (Gastroenterology) Tat, Eustace Quail, DO as Consulting Physician (Neurology)  Extended Emergency Contact Information Primary Emergency Contact: Lehenbauer,Dorothy Address: [address]          [place], [place] [address] Johnnette Litter of [place] Phone: [phone] Mobile Phone: [phone] Relation: Daughter Secondary Emergency Contact: [place], [place] [address] Johnnette Litter of [place] Phone: [phone] Mobile Phone: [phone] Relation: Daughter  Code Status:  DNR Goals of care: Advanced Directive information Advanced Directives 07/08/2021  Does Patient Have a Medical Advance Directive? Yes  Type of Paramedic of [place];Living will;Out of facility DNR (pink MOST or yellow form)  Does patient want to make changes to medical advance directive? No - Patient declined  Copy of [place] in Chart? Yes - validated most recent copy scanned in chart (See row information)  Would patient like information on creating a medical advance directive? -  Pre-existing out of facility DNR order (yellow form or pink MOST form) Pink MOST form placed in chart (order not valid for inpatient use);Yellow form placed in chart (order not valid for inpatient use)     Chief Complaint  Patient presents with   Acute Visit    Increased sundowning at night     HPI:  Pt is a 87 y.o. female seen today for an acute visit for increased confusion.   She currently resides on the skilled nursing unit at [facility]. PMH: CHF, paroxysmal ventricular tachycardia, SA node dysfunction, OSA, dysphagia, multinodular thyroid, constipation, OA, chronic back pain, osteoporosis, thrombocytopenia, polymyalgia rheumatica, and anxiety.   Agitation/hallucinations- nursing staff reports she is more uncooperative with staff in evening, she will pace up and down hallways, not easily redirected, she does not remember encounters mentioned, followed by neurology (Dr. Carles Collet) for visual hallucinations, CT head scheduled for 07/15/2021, recent BIMS 7, no recent MMSE Chronic back pain- Lumbar spine xray 08/2019 revealed some multilevel degenerative disc changes/ no acute abnormality, remains on scheduled norco for pain and lidocaine patches CHF- LV EF 55-60% 2021, no weight fluctuations, remains on torsemide PMR- stable with low dose prednisone, failed taper in past Dysphagia- continues to be seen by ST, no recent aspirations, on pureed diet, seen by GI in past OSA- remains on CPAP qhs Heart block- s/p pacemaker  Constipation- LBM 12/28, remains on miralax GERD- hgb 13.1 06/17/2021, remains on pepcid  No recent falls or injuries. Ambulates with wheelchair.         Past Medical History:  Diagnosis Date   Achalasia    Anemia    Arthritis    back   Chronic steroid use    Complete heart block ([place])    S/P PACEMAKER 2000 W/ GENERATOR CHANGE 2009   Empyema, right ([place]) PULMOLOGIST-- DR CLANCE   VATS 06/23/2012 cultures negative to date CXR 07/19/12 persistent airfluid levels/  CXR 11-01-2012 IMPROVE RIGHT PLEURAL EFFUSION   GERD (gastroesophageal reflux  disease)    History of aspiration pneumonitis    DEC 2013   History of hiatal hernia    Hypertension    Inguinal hernia    right   Intrinsic urethral sphincter deficiency    Megaesophagus    Mixed stress and  urge urinary incontinence    Multinodular thyroid 06/26/2012   Multi nodular goiter. Large nodules in both lobes of the gland.  These nodules fit national criteria for fine needle aspiration  biopsy if not previously assessed.     OSA on CPAP    cpap, doees not know settings   Polymyalgia rheumatica ([place])    on chronic Prednisone 5mg  daily   RBBB    S/P dilatation of esophageal stricture    Past Surgical History:  Procedure Laterality Date   APPENDECTOMY  1953   w/ removal benign kidney tumor    BALLOON DILATION  07/24/2012   Procedure: BALLOON DILATION;  Surgeon: Inda Castle, MD;  Location: [facility];  Service: Endoscopy;  Laterality: N/A;   BALLOON DILATION N/A 12/03/2015   Procedure: BALLOON DILATION;  Surgeon: Mauri Pole, MD;  Location: [facility];  Service: Endoscopy;  Laterality: N/A;  pnuematic balloon   BALLOON DILATION N/A 03/16/2017   Procedure: BALLOON DILATION;  Surgeon: Mauri Pole, MD;  Location: [facility] ENDOSCOPY;  Service: Endoscopy;  Laterality: N/A;  PNUEMATIC BALLOONS   BOTOX INJECTION  08/07/2012   Procedure: BOTOX INJECTION;  Surgeon: Inda Castle, MD;  [facility]: [facility];  Service: Endoscopy;  Laterality: N/A;   BOTOX INJECTION N/A 05/24/2013   Procedure: MACROPLASTIQUE IMPLANT;  Surgeon: Irine Seal, MD;  Location: [facility];  Service: Urology;  Laterality: N/A;   BOTOX INJECTION  02/25/2014   Procedure: BOTOX INJECTION;  Surgeon: Inda Castle, MD;  [facility]: [facility];  Service: Endoscopy;;   CARDIAC PACEMAKER PLACEMENT  06/1999  DR RUTH GREENFIELD AT [facility]  ( LAST PACER CHECK 05-09-2013) for CHB/   END-OF-LIFE GENERATOR CHANGE  2009   CATARACT EXTRACTION W/ INTRAOCULAR LENS  IMPLANT, BILATERAL  2005   DILATION AND CURETTAGE OF UTERUS     ESOPHAGOGASTRODUODENOSCOPY  07/24/2012   Procedure: ESOPHAGOGASTRODUODENOSCOPY (EGD);  Surgeon: Inda Castle, MD;  Location: [facility];  Service: Endoscopy;   Laterality: N/A;   ESOPHAGOGASTRODUODENOSCOPY  08/07/2012   Procedure: ESOPHAGOGASTRODUODENOSCOPY (EGD);  Surgeon: Inda Castle, MD;  Location: [facility];  Service: Endoscopy;  Laterality: N/A;   ESOPHAGOGASTRODUODENOSCOPY N/A 02/25/2014   Procedure: ESOPHAGOGASTRODUODENOSCOPY (EGD);  Surgeon: Inda Castle, MD;  Location: [facility];  Service: Endoscopy;  Laterality: N/A;   ESOPHAGOGASTRODUODENOSCOPY N/A 03/16/2017   Procedure: ESOPHAGOGASTRODUODENOSCOPY (EGD);  Surgeon: Mauri Pole, MD;  Location: [facility] END[facility]COPY;  Service: Endoscopy;  Laterality: N/A;   ESOPHAGOGASTRODUODENOSCOPY N/A 08/02/2018   Procedure: ESOPHAGOGASTRODUODENOSCOPY (EGD);  Surgeon: Mauri Pole, MD;  Location: [facility];  Service: Endoscopy;  Laterality: N/A;   ESOPHAGOGASTRODUODENOSCOPY (EGD) WITH ESOPHAGEAL DILATION  06/27/2012   Procedure: ESOPHAGOGASTRODUODENOSCOPY (EGD) WITH ESOPHAGEAL DILATION;  Surgeon: Ladene Artist, MD,FACG;  Location: [facility];  Service: Endoscopy;  Laterality: N/A;   ESOPHAGOGASTRODUODENOSCOPY (EGD) WITH PROPOFOL N/A 10/09/2015   Procedure: ESOPHAGOGASTRODUODENOSCOPY (EGD) WITH PROPOFOL ( WITH BOTOX);  Surgeon: Milus Banister, MD;  Location: [facility];  Service: Endoscopy;  Laterality: N/A;   ESOPHAGOGASTRODUODENOSCOPY (EGD) WITH PROPOFOL N/A 12/03/2015   Procedure: ESOPHAGOGASTRODUODENOSCOPY (EGD) WITH PROPOFOL;  Surgeon: Mauri Pole, MD;  Location: [facility];  Service: Endoscopy;  Laterality: N/A;   FOREIGN  BODY REMOVAL  08/02/2018   Procedure: FOREIGN BODY REMOVAL;  Surgeon: Mauri Pole, MD;  [facility]: [facility];  Service: Endoscopy;;   PACEMAKER GENERATOR CHANGE  12/13/2007   at [facility] N/A 06/17/2021   Procedure: PPM GENERATOR CHANGEOUT;  Surgeon: [name], MD;  Location: [facility];  Service: Cardiovascular;  Laterality: N/A;   TOE SURGERY  2013   left 3rd toe HAMMERTOE REPAIR   TONSILLECTOMY  AS CHILD    TOTAL HIP ARTHROPLASTY Right 2001   VERICOSE VEIN LIGATION     VIDEO ASSISTED THORACOSCOPY (VATS)/DECORTICATION  06/23/2012   Procedure: VIDEO ASSISTED THORACOSCOPY (VATS)/DECORTICATION;  Surgeon: Grace Isaac, MD;  Location: [facility];  Service: Thoracic;  Laterality: Right;   VIDEO ASSISTED THORACOSCOPY (VATS)/EMPYEMA     06/23/2012   VIDEO BRONCHOSCOPY  06/23/2012   Procedure: VIDEO BRONCHOSCOPY;  Surgeon: Grace Isaac, MD;  Location: [facility];  Service: Thoracic;  Laterality: N/A;    Allergies  Allergen Reactions   Actonel [Risedronate Sodium] Other (See Comments)    Joint aches; rechallenged --caused joint aches   Ivp Dye [Iodinated Contrast Media] Nausea And Vomiting    Allergies as of 07/08/2021       Reactions   Actonel [risedronate Sodium] Other (See Comments)   Joint aches; rechallenged --caused joint aches   Ivp Dye [iodinated Contrast Media] Nausea And Vomiting        Medication List        Accurate as of July 08, 2021 10:59 AM. If you have any questions, ask your nurse or doctor.          acetaminophen 500 MG tablet Commonly known as: TYLENOL Take 500 mg by mouth every 6 (six) hours as needed.   acetaminophen 325 MG tablet Commonly known as: TYLENOL Take 650 mg by mouth 3 (three) times daily.   Calcium Carbonate 500 MG Chew Chew 1,000 mg by mouth daily.   cholecalciferol 25 MCG (1000 UNIT) tablet Commonly known as: VITAMIN D3 Take 1,000 Units by mouth daily.   [employer] JOINT HEALTH TRIPLE ACTION PO Take 1 tablet by mouth daily.   famotidine 20 MG tablet Commonly known as: PEPCID Take 1 tablet (20 mg total) by mouth daily.   feeding supplement (PRO-STAT SUGAR FREE 64) Liqd Take 30 mLs by mouth 2 (two) times daily.   HYDROcodone-acetaminophen 5-325 MG tablet Commonly known as: NORCO/VICODIN Take 0.5 tablets by mouth daily. 9:00 PM   HYDROcodone-acetaminophen 5-325 MG tablet Commonly known as: NORCO/VICODIN Take 0.5 tablets by mouth in  the morning, at noon, in the evening, and at bedtime. 03:00 AM, 07:00 AM, 12:00 PM, 04:00 PM   Iron-Vitamin C 65-125 MG Tabs Take 1 tablet by mouth in the morning.   Lidocaine 4 % Ptch Apply 1 patch topically in the morning and at bedtime.   NON FORMULARY Take 900 mg by mouth daily. Heal and Soothe capsule; 300 mg; amt: 900 mg; oral Once A Day   OCUVITE EYE + MULTI PO Take 1 tablet by mouth daily.   multivitamin with minerals tablet Take 1 tablet by mouth daily.   polyethylene glycol 17 g packet Commonly known as: MIRALAX / GLYCOLAX Take 17 g by mouth daily.   potassium chloride 10 MEQ tablet Commonly known as: KLOR-CON M Take 20 mEq by mouth 3 (three) times daily.   predniSONE 10 MG tablet Commonly known as: DELTASONE Take 10 mg by mouth daily.   torsemide 20 MG tablet  Commonly known as: DEMADEX Take 20 mg by mouth daily.   zinc oxide 20 % ointment Apply 1 application topically as needed for irritation. To buttocks after every incontinent episode and as needed for redness. May keep at bedside.        Review of Systems  Constitutional:  Negative for appetite change, chills, fatigue and fever.  HENT:  Positive for trouble swallowing. Negative for congestion.   Eyes:  Negative for photophobia and visual disturbance.  Respiratory:  Negative for cough, shortness of breath and wheezing.   Cardiovascular:  Negative for chest pain and leg swelling.  Gastrointestinal:  Positive for constipation. Negative for abdominal distention, abdominal pain, diarrhea, nausea and vomiting.  Genitourinary:  Negative for dysuria, frequency and hematuria.  Musculoskeletal:  Positive for arthralgias, back pain and gait problem.  Skin: Negative.   Neurological:  Positive for weakness. Negative for dizziness and numbness.  Psychiatric/Behavioral:  Positive for confusion and sleep disturbance. Negative for dysphoric mood. The patient is not nervous/anxious.    Immunization History   Administered Date(s) Administered   Influenza Split 09/11/2012, 04/12/2013, 04/30/2015   Influenza, High Dose Seasonal PF 04/18/2017, 04/25/2019   Influenza,inj,Quad PF,6+ Mos 04/13/2018   Influenza-Unspecified 09/19/2012, 05/03/2014, 04/22/2016, 04/20/2017, 04/22/2020, 04/30/2021   Moderna SARS-COV2 Booster Vaccination 12/09/2020   Moderna Sars-Covid-2 Vaccination 07/16/2019, 08/13/2019, 05/20/2020, 03/31/2021   Pneumococcal Conjugate-13 04/16/2015   Pneumococcal Polysaccharide-23 07/12/2004   Td 01/10/2012   Tdap 11/05/2015   Zoster Recombinat (Shingrix) 02/07/2016   Pertinent  Health Maintenance Due  Topic Date Due   INFLUENZA VACCINE  Completed   DEXA SCAN  Completed   Fall Risk 10/21/2019 10/22/2019 10/23/2019 06/11/2021 06/17/2021  Falls in the past year? - - 0 1 -  Number of falls in past year - - - - -  Was there an injury with Fall? - - - 0 -  Fall Risk Category Calculator - - - 2 -  Fall Risk Category - - - Moderate -  Patient Fall Risk Level High fall risk High fall risk - High fall risk High fall risk   Functional Status Survey:    Vitals:   07/08/21 1046  BP: 129/73  Pulse: 86  Resp: 20  Temp: (!) 97.5 F (36.4 C)  SpO2: 94%  Weight: 145 lb 1.6 oz (65.8 kg)  Height: 5' (1.524 m)   Body mass index is 28.34 kg/m. Physical Exam Vitals reviewed.  Constitutional:      General: She is not in acute distress. HENT:     Head: Normocephalic.  Eyes:     General:        Right eye: No discharge.        Left eye: No discharge.  Neck:     Vascular: No carotid bruit.  Cardiovascular:     Rate and Rhythm: Normal rate and regular rhythm.     Pulses: Normal pulses.     Heart sounds: Normal heart sounds.  Pulmonary:     Effort: Pulmonary effort is normal. No respiratory distress.     Breath sounds: Normal breath sounds. No wheezing.  Abdominal:     General: Bowel sounds are normal. There is no distension.     Palpations: Abdomen is soft.     Tenderness: There is  no abdominal tenderness.  Musculoskeletal:     Cervical back: Normal range of motion.     Right lower leg: No edema.     Left lower leg: No edema.  Lymphadenopathy:     Cervical:  No cervical adenopathy.  Skin:    General: Skin is warm and dry.     Capillary Refill: Capillary refill takes less than 2 seconds.  Neurological:     General: No focal deficit present.     Mental Status: She is alert. Mental status is at baseline.     Motor: Weakness present.     Gait: Gait abnormal.     Comments: wheelchair  Psychiatric:        Mood and Affect: Mood normal.        Behavior: Behavior normal.        Cognition and Memory: Memory is impaired.     Comments: Alert to self, familiar faces, disoriented to time and place.     Labs reviewed: Recent Labs    10/30/20 1203 03/03/21 0000 03/19/21 0000 05/11/21 1556 06/17/21 1144  NA 142   < > 144 142 140  K 4.0   < > 3.9   3.6 4.0 3.3*  CL 106   < > 104 99 101  CO2 27   < > 34* 29 32  GLUCOSE 127*  --   --  125* 116*  BUN 18   < > 23* 19 20  CREATININE 0.74   < > 0.7 0.75 0.67  CALCIUM 8.8*   < > 9.4 9.9 9.5   < > = values in this interval not displayed.   Recent Labs    10/04/20 0000 03/03/21 0000 03/19/21 0000  AST 13 13 12*  ALT 9 10 7   ALKPHOS 48 54 70  ALBUMIN 3.5 3.3* 3.2*   Recent Labs    10/04/20 0000 10/30/20 1203 03/03/21 0000 03/19/21 0000 05/11/21 1556 06/17/21 1144  WBC 5.7 7.8 5.9 4.8 7.0 7.8  NEUTROABS 3,295.00  --  3,180.00 2,534.00  --   --   HGB 12.7 13.6 12.3 11.6* 12.6 13.1  HCT 38 42.9 36 35* 36.3 41.2  MCV  --  106.5*  --   --  96 105.6*  PLT 130* 124* 109* 118* 180 145*   Lab Results  Component Value Date   TSH 0.50 03/03/2021   Lab Results  Component Value Date   HGBA1C 5.3 03/03/2021   Lab Results  Component Value Date   CHOL 146 03/03/2021   HDL 51 03/03/2021   LDLCALC 80 03/03/2021   TRIG 71 03/03/2021   CHOLHDL 3.3 02/26/2019    Significant Diagnostic Results in last 30 days:   CUP PACEART INCLINIC DEVICE CHECK  Result Date: 07/01/2021 Wound check appointment. Remaining dermabond removed. Wound without redness or edema. Incision edges approximated, wound well healed. Normal device function. Thresholds, sensing, and impedances consistent with implant measurements. Device programmed at appropriate chronic safety margin. Histogram distribution appropriate for patient and level of activity. No mode switches or high ventricular rates noted. Patient educated about wound care, arm mobility, lifting restrictions. ROV in 3 months with Dr. Caryl Comes   Assessment/Plan 1. Visual hallucination - increased agitation in evening- behaviors similar to sundowning - followed by Dr. Carles Collet- neuro - CT head scheduled 07/15/2021 - do not recommend Ativan prn due to scheduled norco use - will trial melatonin 5 mg po qhs x 14 days  - MMSE- future  2. Chronic midline low back pain without sciatica - no aggressive work up at this time- goal pain control - cont scheduled norco and lidocaine patches  3. Chronic diastolic congestive heart failure (HCC) - no weight fluctuations - cont torsemide  4. Polymyalgia rheumatica (HCC) -  stable with prednisone - failed taper in past  5. Achalasia of esophagus - followed by ST - no recent aspirations - cont pureed diet  6. Obstructive sleep apnea syndrome - cont CPAP qhs  7. Cardiac pacemaker in situ - followed by Dr. Caryl Comes  8. Slow transit constipation - LBM 12/28, abdomen soft - cont miralax  9. Gastroesophageal reflux disease without esophagitis - no increased reflux -cont pepcid  10. Aortic atherosclerosis ([place]) - noted on lumbar xray 08/21/2019    Family/ staff Communication: plan discussed with patient and nurse  Labs/tests ordered:   none

## 2021-07-13 ENCOUNTER — Encounter: Payer: Self-pay | Admitting: Internal Medicine

## 2021-07-15 ENCOUNTER — Ambulatory Visit
Admission: RE | Admit: 2021-07-15 | Discharge: 2021-07-15 | Disposition: A | Discharge status: Home / Self Care | Payer: Medicare Other | Source: Ambulatory Visit | Attending: Neurology | Admitting: Neurology

## 2021-07-15 DIAGNOSIS — R443 Hallucinations, unspecified: Secondary | ICD-10-CM | POA: Diagnosis not present

## 2021-07-15 DIAGNOSIS — R441 Visual hallucinations: Secondary | ICD-10-CM

## 2021-07-15 DIAGNOSIS — R531 Weakness: Secondary | ICD-10-CM

## 2021-07-17 ENCOUNTER — Encounter: Payer: Self-pay | Admitting: Internal Medicine

## 2021-07-17 ENCOUNTER — Non-Acute Institutional Stay (SKILLED_NURSING_FACILITY): Payer: Medicare Other | Admitting: Orthopedic Surgery

## 2021-07-17 ENCOUNTER — Encounter: Payer: Self-pay | Admitting: Orthopedic Surgery

## 2021-07-17 DIAGNOSIS — L89312 Pressure ulcer of right buttock, stage 2: Secondary | ICD-10-CM | POA: Diagnosis not present

## 2021-07-17 DIAGNOSIS — R441 Visual hallucinations: Secondary | ICD-10-CM | POA: Diagnosis not present

## 2021-07-17 DIAGNOSIS — M545 Low back pain, unspecified: Secondary | ICD-10-CM

## 2021-07-17 DIAGNOSIS — G8929 Other chronic pain: Secondary | ICD-10-CM

## 2021-07-17 DIAGNOSIS — L89322 Pressure ulcer of left buttock, stage 2: Secondary | ICD-10-CM

## 2021-07-17 NOTE — Progress Notes (Signed)
Location:   [facility] Room Number: 36 Place of Service:  SNF ([phone]) Provider:  Yvonna Alanis, NP   [name], MD  Patient Care Team: [name], MD as PCP - General (Internal Medicine) [name], MD as PCP - Electrophysiology (Cardiology) [place] [name], MD as Consulting Physician (Cardiology) Irine Seal, MD as Attending Physician (Urology) Clance, Armando Reichert, MD as Consulting Physician (Pulmonary Disease) Grace Isaac, MD (Inactive) as Consulting Physician (Cardiothoracic Surgery) Mauri Pole, MD as Consulting Physician (Gastroenterology) Tat, Eustace Quail, DO as Consulting Physician (Neurology)  Extended Emergency Contact Information Primary Emergency Contact: Cambria,Dorothy Address: [address]          [place], [place] [address] Johnnette Litter of [place] Phone: [phone] Mobile Phone: [phone] Relation: Daughter Secondary Emergency Contact: [place], [place] [address] Johnnette Litter of [place] Phone: [phone] Mobile Phone: [phone] Relation: Daughter  Code Status:  DNR Goals of care: Advanced Directive information Advanced Directives 07/17/2021  Does Patient Have a Medical Advance Directive? Yes  Type of Paramedic of [place];Living will;Out of facility DNR (pink MOST or yellow form)  Does patient want to make changes to medical advance directive? No - Patient declined  Copy of [place] in Chart? Yes - validated most recent copy scanned in chart (See row information)  Would patient like information on creating a medical advance directive? -  Pre-existing out of facility DNR order (yellow form or pink MOST form) Pink MOST form placed in chart (order not valid for inpatient use);Yellow form placed in chart (order not valid for inpatient use)     Chief Complaint  Patient presents with   Acute Visit    pressure ulcers    HPI:  Pt  is a 88 y.o. female seen today for an acute visit for pressure ulcers.   She currently resides on the skilled nursing unit at [facility] reported stage 2 pressure ulcer to left inner buttocks. Daughter reports additional skin breakdown to right inner buttocks today. Foam dressing currently being applied to left pressure ulcer daily. Daughter verbally upset with nursing care today.   In addition, she continues to wander the halls in her wheelchair at night. At times she is not easily redirected. Trial of melatonin recommended x 14 days, refused by family. She is followed by Dr. Carles Collet for visual hallucinations. CT head completed 07/15/2021, chronic infarct of right frontal lobe noted, no acute abnormalities. Neurocognitive evaluation scheduled 12/22/2021. She is receiving around- the- clock hydrocodone for chronic back pain, including a scheduled dose at 3 am. I asked the patient if she would like to continue receiving pain medication in the middle of the night, she stated " no, I would like to sleep."      Past Medical History:  Diagnosis Date   Achalasia    Anemia    Arthritis    back   Chronic steroid use    Complete heart block ([place])    S/P PACEMAKER 2000 W/ GENERATOR CHANGE 2009   Empyema, right ([place]) PULMOLOGIST-- DR CLANCE   VATS 06/23/2012 cultures negative to date CXR 07/19/12 persistent airfluid levels/  CXR 11-01-2012 IMPROVE RIGHT PLEURAL EFFUSION   GERD (gastroesophageal reflux disease)    History of aspiration pneumonitis    DEC 2013   History of hiatal hernia    Hypertension    Inguinal hernia  right   Intrinsic urethral sphincter deficiency    Megaesophagus    Mixed stress and urge urinary incontinence    Multinodular thyroid 06/26/2012   Multi nodular goiter. Large nodules in both lobes of the gland.  These nodules fit national criteria for fine needle aspiration  biopsy if not previously assessed.     OSA on CPAP    cpap, doees not know settings    Polymyalgia rheumatica ([place])    on chronic Prednisone 5mg  daily   RBBB    S/P dilatation of esophageal stricture    Past Surgical History:  Procedure Laterality Date   APPENDECTOMY  1953   w/ removal benign kidney tumor    BALLOON DILATION  07/24/2012   Procedure: BALLOON DILATION;  Surgeon: Inda Castle, MD;  Location: [facility] ENDOS[facility];  Service: Endoscopy;  Laterality: N/A;   BALLOON DILATION N/A 12/03/2015   Procedure: BALLOON DILATION;  Surgeon: Mauri Pole, MD;  Location: [facility];  Service: Endoscopy;  Laterality: N/A;  pnuematic balloon   BALLOON DILATION N/A 03/16/2017   Procedure: BALLOON DILATION;  Surgeon: Mauri Pole, MD;  Location: [facility] ENDOSCOPY;  Service: Endoscopy;  Laterality: N/A;  PNUEMATIC BALLOONS   BOTOX INJECTION  08/07/2012   Procedure: BOTOX INJECTION;  Surgeon: Inda Castle, MD;  [facility];  Service: Endoscopy;  Laterality: N/A;   BOTOX INJECTION N/A 05/24/2013   Procedure: MACROPLASTIQUE IMPLANT;  Surgeon: Irine Seal, MD;  Location: [facility];  Service: Urology;  Laterality: N/A;   BOTOX INJECTION  02/25/2014   Procedure: BOTOX INJECTION;  Surgeon: Inda Castle, MD;  [facility]: [facility];  Service: Endoscopy;;   CARDIAC PACEMAKER PLACEMENT  06/1999  DR RUTH GREENFIELD AT [facility]  ( LAST PACER CHECK 05-09-2013) for CHB/   END-OF-LIFE GENERATOR CHANGE  2009   CATARACT EXTRACTION W/ INTRAOCULAR LENS  IMPLANT, BILATERAL  2005   DILATION AND CURETTAGE OF UTERUS     ESOPHAGOGASTRODUODENOSCOPY  07/24/2012   Procedure: ESOPHAGOGASTRODUODENOSCOPY (EGD);  Surgeon: Inda Castle, MD;  Location: [facility];  Service: Endoscopy;  Laterality: N/A;   ESOPHAGOGASTRODUODENOSCOPY  08/07/2012   Procedure: ESOPHAGOGASTRODUODENOSCOPY (EGD);  Surgeon: Inda Castle, MD;  Location: [facility];  Service: Endoscopy;  Laterality: N/A;   ESOPHAGOGASTRODUODENOSCOPY N/A 02/25/2014   Procedure: ESOPHAGOGASTRODUODENOSCOPY (EGD);   Surgeon: Inda Castle, MD;  Location: [facility];  Service: Endoscopy;  Laterality: N/A;   ESOPHAGOGASTRODUODENOSCOPY N/A 03/16/2017   Procedure: ESOPHAGOGASTRODUODENOSCOPY (EGD);  Surgeon: Mauri Pole, MD;  Location[facility] ENDOSCOPY;  Service: Endoscopy;  Laterality: N/A;   ESOPHAGOGASTRODUODENOSCOPY N/A 08/02/2018   Procedure: ESOPHAGOGASTRODUODENOSCOPY (EGD);  Surgeon: Mauri Pole, MD;  Location: [facility];  Service: Endoscopy;  Laterality: N/A;   ESOPHAGOGASTRODUODENOSCOPY (EGD) WITH ESOPHAGEAL DILATION  06/27/2012   Procedure: ESOPHAGOGASTRODUODENOSCOPY (EGD) WITH ESOPHAGEAL DILATION;  Surgeon: Ladene Artist, MD,FACG;  Location: [facility];  Service: Endoscopy;  Laterality: N/A;   ESOPHAGOGASTRODUODENOSCOPY (EGD) WITH PROPOFOL N/A 10/09/2015   Procedure: ESOPHAGOGASTRODUODENOSCOPY (EGD) WITH PROPOFOL ( WITH BOTOX);  Surgeon: Milus Banister, MD;  Location: [facility];  Service: Endoscopy;  Laterality: N/A;   ESOPHAGOGASTRODUODENOSCOPY (EGD) WITH PROPOFOL N/A 12/03/2015   Procedure: ESOPHAGOGASTRODUODENOSCOPY (EGD) WITH PROPOFOL;  Surgeon: Mauri Pole, MD;  Location: [facility]OSCOPY;  Service: Endoscopy;  Laterality: N/A;   FOREIGN BODY REMOVAL  08/02/2018   Procedure: FOREIGN BODY REMOVAL;  Surgeon: Mauri Pole, MD;  [facility]: [facility];  Service: Endoscopy;;   PACEMAKER GENERATOR CHANGE  12/13/2007  at [facility] N/A 06/17/2021   Procedure: [place];  Surgeon: [name], MD;  Location: [facility] CV LAB;  Service: Cardiovascular;  Laterality: N/A;   TOE SURGERY  2013   left 3rd toe HAMMERTOE REPAIR   TONSILLECTOMY  AS CHILD   TOTAL HIP ARTHROPLASTY Right 2001   VERICOSE VEIN LIGATION     VIDEO ASSISTED THORACOSCOPY (VATS)/DECORTICATION  06/23/2012   Procedure: VIDEO ASSISTED THORACOSCOPY (VATS)/DECORTICATION;  Surgeon: Grace Isaac, MD;  Location: [facility];  Service: Thoracic;  Laterality: Right;   VIDEO  ASSISTED THORACOSCOPY (VATS)/EMPYEMA     06/23/2012   VIDEO BRONCHOSCOPY  06/23/2012   Procedure: VIDEO BRONCHOSCOPY;  Surgeon: Grace Isaac, MD;  Location: [facility];  Service: Thoracic;  Laterality: N/A;    Allergies  Allergen Reactions   Actonel [Risedronate Sodium] Other (See Comments)    Joint aches; rechallenged --caused joint aches   Ivp Dye [Iodinated Contrast Media] Nausea And Vomiting    Allergies as of 07/17/2021       Reactions   Actonel [risedronate Sodium] Other (See Comments)   Joint aches; rechallenged --caused joint aches   Ivp Dye [iodinated Contrast Media] Nausea And Vomiting        Medication List        Accurate as of July 17, 2021  3:44 PM. If you have any questions, ask your nurse or doctor.          acetaminophen 500 MG tablet Commonly known as: TYLENOL Take 500 mg by mouth every 6 (six) hours as needed.   acetaminophen 325 MG tablet Commonly known as: TYLENOL Take 650 mg by mouth 3 (three) times daily.   Calcium Carbonate 500 MG Chew Chew 1,000 mg by mouth daily.   cholecalciferol 25 MCG (1000 UNIT) tablet Commonly known as: VITAMIN D3 Take 1,000 Units by mouth daily.   [employer] JOINT HEALTH TRIPLE ACTION PO Take 1 tablet by mouth daily.   famotidine 20 MG tablet Commonly known as: PEPCID Take 1 tablet (20 mg total) by mouth daily.   feeding supplement (PRO-STAT SUGAR FREE 64) Liqd Take 30 mLs by mouth 2 (two) times daily.   HYDROcodone-acetaminophen 5-325 MG tablet Commonly known as: NORCO/VICODIN Take 0.5 tablets by mouth daily. 9:00 PM   HYDROcodone-acetaminophen 5-325 MG tablet Commonly known as: NORCO/VICODIN Take 0.5 tablets by mouth in the morning, at noon, in the evening, and at bedtime. 03:00 AM, 07:00 AM, 12:00 PM, 04:00 PM   Iron-Vitamin C 65-125 MG Tabs Take 1 tablet by mouth in the morning.   Lidocaine 4 % Ptch Apply 1 patch topically in the morning and at bedtime.   melatonin 5 MG Tabs Take 5 mg by mouth at  bedtime as needed.   NON FORMULARY Take 900 mg by mouth daily. Heal and Soothe capsule; 300 mg; amt: 900 mg; oral Once A Day   OCUVITE EYE + MULTI PO Take 1 tablet by mouth daily.   multivitamin with minerals tablet Take 1 tablet by mouth daily.   polyethylene glycol 17 g packet Commonly known as: MIRALAX / GLYCOLAX Take 17 g by mouth daily.   potassium chloride 10 MEQ tablet Commonly known as: KLOR-CON M Take 20 mEq by mouth 3 (three) times daily.   predniSONE 10 MG tablet Commonly known as: DELTASONE Take 10 mg by mouth daily.   torsemide 20 MG tablet Commonly known as: DEMADEX Take 20 mg by mouth daily.   zinc oxide 20 %  ointment Apply 1 application topically as needed for irritation. To buttocks after every incontinent episode and as needed for redness. May keep at bedside.        Review of Systems  Constitutional:  Positive for activity change. Negative for appetite change, fatigue and fever.  Respiratory:  Negative for cough, shortness of breath and wheezing.   Cardiovascular:  Negative for chest pain and leg swelling.  Gastrointestinal:  Negative for constipation, diarrhea and nausea.  Musculoskeletal:  Positive for back pain and gait problem.  Skin:  Positive for wound.  Neurological:  Positive for weakness.  Psychiatric/Behavioral:  Positive for sleep disturbance. Negative for confusion and dysphoric mood. The patient is not nervous/anxious.    Immunization History  Administered Date(s) Administered   Influenza Split 09/11/2012, 04/12/2013, 04/30/2015   Influenza, High Dose Seasonal PF 04/18/2017, 04/25/2019   Influenza,inj,Quad PF,6+ Mos 04/13/2018   Influenza-Unspecified 09/19/2012, 05/03/2014, 04/22/2016, 04/20/2017, 04/22/2020, 04/30/2021   Moderna SARS-COV2 Booster Vaccination 12/09/2020   Moderna Sars-Covid-2 Vaccination 07/16/2019, 08/13/2019, 05/20/2020, 03/31/2021   Pneumococcal Conjugate-13 04/16/2015   Pneumococcal Polysaccharide-23 07/12/2004    Td 01/10/2012   Tdap 11/05/2015   Zoster Recombinat (Shingrix) 02/07/2016   Pertinent  Health Maintenance Due  Topic Date Due   INFLUENZA VACCINE  Completed   DEXA SCAN  Completed   Fall Risk 10/21/2019 10/22/2019 10/23/2019 06/11/2021 06/17/2021  Falls in the past year? - - 0 1 -  Number of falls in past year - - - - -  Was there an injury with Fall? - - - 0 -  Fall Risk Category Calculator - - - 2 -  Fall Risk Category - - - Moderate -  Patient Fall Risk Level High fall risk High fall risk - High fall risk High fall risk   Functional Status Survey:    Vitals:   07/17/21 1538  BP: 135/83  Pulse: 77  Resp: 18  Temp: (!) 97.5 F (36.4 C)  SpO2: 94%  Weight: 136 lb 12.8 oz (62.1 kg)  Height: 5' (1.524 m)   Body mass index is 26.72 kg/m. Physical Exam Vitals reviewed.  Constitutional:      General: She is not in acute distress. HENT:     Head: Normocephalic.  Cardiovascular:     Rate and Rhythm: Normal rate and regular rhythm.     Pulses: Normal pulses.     Heart sounds: Normal heart sounds.  Pulmonary:     Effort: Pulmonary effort is normal.     Breath sounds: Normal breath sounds.  Abdominal:     General: Bowel sounds are normal. There is no distension.     Palpations: Abdomen is soft.  Musculoskeletal:     Right lower leg: No edema.     Left lower leg: No edema.  Skin:    General: Skin is warm and dry.     Capillary Refill: Capillary refill takes less than 2 seconds.     Comments: Stage 2 pressure ulcer to left and right gluteal crease, dermis exposed, serous drainage present, wound bed light pink, surrounding skin blanchable, no odor  Neurological:     General: No focal deficit present.     Mental Status: She is alert and oriented to person, place, and time.     Motor: Weakness present.     Gait: Gait abnormal.  Psychiatric:        Mood and Affect: Mood normal.        Behavior: Behavior normal.     Comments: Very pleasant,  follows commands, able to  express needs     Labs reviewed: Recent Labs    10/30/20 1203 03/03/21 0000 03/19/21 0000 05/11/21 1556 06/17/21 1144  NA 142   < > 144 142 140  K 4.0   < > 3.9   3.6 4.0 3.3*  CL 106   < > 104 99 101  CO2 27   < > 34* 29 32  GLUCOSE 127*  --   --  125* 116*  BUN 18   < > 23* 19 20  CREATININE 0.74   < > 0.7 0.75 0.67  CALCIUM 8.8*   < > 9.4 9.9 9.5   < > = values in this interval not displayed.   Recent Labs    10/04/20 0000 03/03/21 0000 03/19/21 0000  AST 13 13 12*  ALT 9 10 7   ALKPHOS 48 54 70  ALBUMIN 3.5 3.3* 3.2*   Recent Labs    10/04/20 0000 10/30/20 1203 03/03/21 0000 03/19/21 0000 05/11/21 1556 06/17/21 1144  WBC 5.7 7.8 5.9 4.8 7.0 7.8  NEUTROABS 3,295.00  --  3,180.00 2,534.00  --   --   HGB 12.7 13.6 12.3 11.6* 12.6 13.1  HCT 38 42.9 36 35* 36.3 41.2  MCV  --  106.5*  --   --  96 105.6*  PLT 130* 124* 109* 118* 180 145*   Lab Results  Component Value Date   TSH 0.50 03/03/2021   Lab Results  Component Value Date   HGBA1C 5.3 03/03/2021   Lab Results  Component Value Date   CHOL 146 03/03/2021   HDL 51 03/03/2021   LDLCALC 80 03/03/2021   TRIG 71 03/03/2021   CHOLHDL 3.3 02/26/2019    Significant Diagnostic Results in last 30 days:  CT HEAD WO CONTRAST (5MM)  Result Date: 07/15/2021 CLINICAL DATA:  Visual hallucination. EXAM: CT HEAD WITHOUT CONTRAST TECHNIQUE: Contiguous axial images were obtained from the base of the skull through the vertex without intravenous contrast. COMPARISON:  None. FINDINGS: Brain: Mild cerebral atrophy, less than expected for age. Negative for hydrocephalus. Hypodensity right middle frontal lobe most compatible with chronic infarction. Negative for acute infarct, hemorrhage, mass. Vascular: Negative for hyperdense vessel Skull: Negative Sinuses/Orbits: Paranasal sinuses clear. Bilateral cataract extraction Other: None IMPRESSION: Chronic infarct right frontal lobe.  No acute abnormality. Electronically Signed    By: Franchot Gallo M.D.   On: 07/15/2021 15:44   CUP PACEART INCLINIC DEVICE CHECK  Result Date: 07/01/2021 Wound check appointment. Remaining dermabond removed. Wound without redness or edema. Incision edges approximated, wound well healed. Normal device function. Thresholds, sensing, and impedances consistent with implant measurements. Device programmed at appropriate chronic safety margin. Histogram distribution appropriate for patient and level of activity. No mode switches or high ventricular rates noted. Patient educated about wound care, arm mobility, lifting restrictions. ROV in 3 months with Dr. Caryl Comes   Assessment/Plan 1. Pressure injury of left buttock, stage 2 ([place]) - first noted 12/30 - cont foam dressing changes daily and prn - apply air pressure mattress to bed - promote frequent position changes- Q 2hrs - recommend side lying during naps in afternoon- please provide additional pillows for comfort - zinc oxide 20%- apply to buttocks and pressure points bid x 14 days, then bid prn  2. Pressure injury of right buttock, stage 2 ([place]) - first noted 01/06 - see above  3. Visual hallucination - wandering halls at night, not redirecting at times - trial of melatonin x 14 days-  refused by daughter - followed by neuro - neurocognitive testing scheduled 12/22/2021 - will discontinue scheduled hydrocodone at 3 am to promote sleep  4. Chronic midline low back pain without sciatica - continues to work with PT - Xrays negative at this time - CT spine recommended- daughter not interested at this time - will increase hydrocodone 9 pm dose to compensate for stopping 3 am dose - cont lidocaine patches   Family/ staff Communication: plans discussed with patient and nurse  Labs/tests ordered:  none

## 2021-07-18 ENCOUNTER — Encounter: Payer: Self-pay | Admitting: Internal Medicine

## 2021-07-18 ENCOUNTER — Encounter: Payer: Self-pay | Admitting: Orthopedic Surgery

## 2021-07-20 ENCOUNTER — Encounter: Payer: Self-pay | Admitting: Internal Medicine

## 2021-07-20 ENCOUNTER — Telehealth: Payer: Self-pay | Admitting: *Deleted

## 2021-07-20 ENCOUNTER — Encounter: Payer: Self-pay | Admitting: Neurology

## 2021-07-20 NOTE — Telephone Encounter (Signed)
Patient daughter , Magdalen Spatz called requesting to speak with you personally. Stated that she wants you to look at the [employer] and call her.   Stated that she is Not happy with Amy or her responses. Stated that Amy got offensive and did not honor patient's wishes.   Would like to speak with Dr. Lyndel Safe directly. Please Call [phone]

## 2021-07-21 NOTE — Telephone Encounter (Signed)
Called patients daughter and apologized for the delay in calling patients daughter back with results and what those results are. Pateints daughter didn't have any further questions at this time

## 2021-07-23 ENCOUNTER — Non-Acute Institutional Stay (SKILLED_NURSING_FACILITY): Payer: Medicare Other | Admitting: Internal Medicine

## 2021-07-23 ENCOUNTER — Encounter: Payer: Self-pay | Admitting: Internal Medicine

## 2021-07-23 DIAGNOSIS — G8929 Other chronic pain: Secondary | ICD-10-CM

## 2021-07-23 DIAGNOSIS — G4733 Obstructive sleep apnea (adult) (pediatric): Secondary | ICD-10-CM | POA: Diagnosis not present

## 2021-07-23 DIAGNOSIS — Z950 Presence of cardiac pacemaker: Secondary | ICD-10-CM

## 2021-07-23 DIAGNOSIS — M545 Low back pain, unspecified: Secondary | ICD-10-CM

## 2021-07-23 DIAGNOSIS — M353 Polymyalgia rheumatica: Secondary | ICD-10-CM | POA: Diagnosis not present

## 2021-07-23 DIAGNOSIS — L89302 Pressure ulcer of unspecified buttock, stage 2: Secondary | ICD-10-CM

## 2021-07-23 DIAGNOSIS — R441 Visual hallucinations: Secondary | ICD-10-CM

## 2021-07-23 DIAGNOSIS — I5032 Chronic diastolic (congestive) heart failure: Secondary | ICD-10-CM

## 2021-07-23 DIAGNOSIS — K220 Achalasia of cardia: Secondary | ICD-10-CM

## 2021-07-23 NOTE — Progress Notes (Signed)
Location:   [facility] Room Number: 36 Place of Service:  SNF [phone]) Provider:  Veleta Miners MD  [name], MD  Patient Care Team: [name], MD as PCP - General (Internal Medicine) [name], MD as PCP - Electrophysiology (Cardiology) [place], [facility] [name], MD as Consulting Physician (Cardiology) Irine Seal, MD as Attending Physician (Urology) Clance, Armando Reichert, MD as Consulting Physician (Pulmonary Disease) Grace Isaac, MD (Inactive) as Consulting Physician (Cardiothoracic Surgery) Mauri Pole, MD as Consulting Physician (Gastroenterology) Tat, Eustace Quail, DO as Consulting Physician (Neurology)  Extended Emergency Contact Information Primary Emergency Contact: Lipuma,Dorothy Address: [address]          [place], [place] [address] Johnnette Litter of [place] Phone: [phone] Mobile Phone: [phone] Relation: Daughter Secondary Emergency Contact: [place], [place] [address] Johnnette Litter of [place] Phone: [phone] Mobile Phone: [phone] Relation: Daughter  Code Status:  DNR Goals of care: Advanced Directive information Advanced Directives 07/23/2021  Does Patient Have a Medical Advance Directive? Yes  Type of Paramedic of [place];Living will;Out of facility DNR (pink MOST or yellow form)  Does patient want to make changes to medical advance directive? No - Patient declined  Copy of [place] in Chart? Yes - validated most recent copy scanned in chart (See row information)  Would patient like information on creating a medical advance directive? -  Pre-existing out of facility DNR order (yellow form or pink MOST form) Pink MOST form placed in chart (order not valid for inpatient use);Yellow form placed in chart (order not valid for inpatient use)     Chief Complaint  Patient presents with   Acute Visit    HPI:  Pt is a 88 y.o. female  seen today for an acute visit for Lower back pain Confusion Weight loss  Patient has a history of PMR, esophageal dysphagia, diastolic CHF, s/p pacemaker, anemia, hallucinations visual, osteoporosis, compression fracture, OSA   Back pain Patient has been having back pain recently was started on Norco but her pain continued  x-ray multiple times have not shown any fracture just showed on degenerative changes. She did do well on scheduled Norco..  She was getting a dose at 3 AM.  Which was causing her to get confused.  Patient also is taking Tylenol  Daughter is worried that patient is in pain and that is why she is more sedentary. Patient herself does not complain of pain.  Per therapy she does get very tired and does have some pain in the end of the session. At this time we had decided not to do a CT scan of her back due to her frailty.  Cannot do MRI due to pacemaker Confusion Recent worsening with her move to a new room in SNF.  She also lost her husband few months ago.  Continues to deny any depression. Pressure ulcer stage II and weight loss Poor appetite History of PMR controlled on prednisone has failed tapering Visual hallucinations Seen by Dr. Carles Collet  she does not think she has Parkinson.  Hallucinations related to prednisone CT scan done recently did not show any acute change  Wt Readings from Last 3 Encounters:  07/23/21 136 lb 12.8 oz (62.1 kg)  07/17/21 136 lb 12.8 oz (62.1 kg)  07/08/21 145 lb 1.6 oz (65.8 kg)     Past Medical History:  Diagnosis Date  Achalasia    Anemia    Arthritis    back   Chronic steroid use    Complete heart block ([place])    S/P PACEMAKER 2000 W/ GENERATOR CHANGE 2009   Empyema, right ([place]) PULMOLOGIST-- DR CLANCE   VATS 06/23/2012 cultures negative to date CXR 07/19/12 persistent airfluid levels/  CXR 11-01-2012 IMPROVE RIGHT PLEURAL EFFUSION   GERD (gastroesophageal reflux disease)    History of aspiration pneumonitis    DEC 2013   History of  hiatal hernia    Hypertension    Inguinal hernia    right   Intrinsic urethral sphincter deficiency    Megaesophagus    Mixed stress and urge urinary incontinence    Multinodular thyroid 06/26/2012   Multi nodular goiter. Large nodules in both lobes of the gland.  These nodules fit national criteria for fine needle aspiration  biopsy if not previously assessed.     OSA on CPAP    cpap, doees not know settings   Polymyalgia rheumatica ([place])    on chronic Prednisone 5mg  daily   RBBB    S/P dilatation of esophageal stricture    Past Surgical History:  Procedure Laterality Date   APPENDECTOMY  1953   w/ removal benign kidney tumor    BALLOON DILATION  07/24/2012   Procedure: BALLOON DILATION;  Surgeon: Inda Castle, MD;  Location: [facility];  Service: Endoscopy;  Laterality: N/A;   BALLOON DILATION N/A 12/03/2015   Procedure: BALLOON DILATION;  Surgeon: Mauri Pole, MD;  Location: [facility];  Service: Endoscopy;  Laterality: N/A;  pnuematic balloon   BALLOON DILATION N/A 03/16/2017   Procedure: BALLOON DILATION;  Surgeon: Mauri Pole, MD;  Location: [facility];  Service: Endoscopy;  Laterality: N/A;  PNUEMATIC BALLOONS   BOTOX INJECTION  08/07/2012   Procedure: BOTOX INJECTION;  Surgeon: Inda Castle, MD;  [facility]: [facility];  Service: Endoscopy;  Laterality: N/A;   BOTOX INJECTION N/A 05/24/2013   Procedure: MACROPLASTIQUE IMPLANT;  Surgeon: Irine Seal, MD;  Location: [facility];  Service: Urology;  Laterality: N/A;   BOTOX INJECTION  02/25/2014   Procedure: BOTOX INJECTION;  Surgeon: Inda Castle, MD;  [facility]: [facility];  Service: Endoscopy;;   CARDIAC PACEMAKER PLACEMENT  06/1999  DR RUTH [place] AT [facility]  ( LAST PACER CHECK 05-09-2013) for CHB/   END-OF-LIFE GENERATOR CHANGE  2009   CATARACT EXTRACTION W/ INTRAOCULAR LENS  IMPLANT, BILATERAL  2005   DILATION AND CURETTAGE OF UTERUS     ESOPHAGOGASTRODUODENOSCOPY   07/24/2012   Procedure: ESOPHAGOGASTRODUODENOSCOPY (EGD);  Surgeon: Inda Castle, MD;  Location: [facility];  Service: Endoscopy;  Laterality: N/A;   ESOPHAGOGASTRODUODENOSCOPY  08/07/2012   Procedure: ESOPHAGOGASTRODUODENOSCOPY (EGD);  Surgeon: Inda Castle, MD;  Location: [facility];  Service: Endoscopy;  Laterality: N/A;   ESOPHAGOGASTRODUODENOSCOPY N/A 02/25/2014   Procedure: ESOPHAGOGASTRODUODENOSCOPY (EGD);  Surgeon: Inda Castle, MD;  Location: [facility];  Service: Endoscopy;  Laterality: N/A;   ESOPHAGOGASTRODUODENOSCOPY N/A 03/16/2017   Procedure: ESOPHAGOGASTRODUODENOSCOPY (EGD);  Surgeon: Mauri Pole, MD;  Location: [facility] END[facility]COPY;  Service: Endoscopy;  Laterality: N/A;   ESOPHAGOGASTRODUODENOSCOPY N/A 08/02/2018   Procedure: ESOPHAGOGASTRODUODENOSCOPY (EGD);  Surgeon: Mauri Pole, MD;  Location: [facility];  Service: Endoscopy;  Laterality: N/A;   ESOPHAGOGASTRODUODENOSCOPY (EGD) WITH ESOPHAGEAL DILATION  06/27/2012   Procedure: ESOPHAGOGASTRODUODENOSCOPY (EGD) WITH ESOPHAGEAL DILATION;  Surgeon: Ladene Artist, MD,FACG;  Location: [facility];  Service: Endoscopy;  Laterality: N/A;  ESOPHAGOGASTRODUODENOSCOPY (EGD) WITH PROPOFOL N/A 10/09/2015   Procedure: ESOPHAGOGASTRODUODENOSCOPY (EGD) WITH PROPOFOL ( WITH BOTOX);  Surgeon: Milus Banister, MD;  Location: [facility];  Service: Endoscopy;  Laterality: N/A;   ESOPHAGOGASTRODUODENOSCOPY (EGD) WITH PROPOFOL N/A 12/03/2015   Procedure: ESOPHAGOGASTRODUODENOSCOPY (EGD) WITH PROPOFOL;  Surgeon: Mauri Pole, MD;  Location: [facility];  Service: Endoscopy;  Laterality: N/A;   FOREIGN BODY REMOVAL  08/02/2018   Procedure: FOREIGN BODY REMOVAL;  Surgeon: Mauri Pole, MD;  Location: [facility];  Service: Endoscopy;;   PACEMAKER GENERATOR CHANGE  12/13/2007   at [facility] N/A 06/17/2021   Procedure: PPM GENERATOR CHANGEOUT;  Surgeon: [name], MD;  Location: [facility];  Service: Cardiovascular;  Laterality: N/A;   TOE SURGERY  2013   left 3rd toe HAMMERTOE REPAIR   TONSILLECTOMY  AS CHILD   TOTAL HIP ARTHROPLASTY Right 2001   VERICOSE VEIN LIGATION     VIDEO ASSISTED THORACOSCOPY (VATS)/DECORTICATION  06/23/2012   Procedure: VIDEO ASSISTED THORACOSCOPY (VATS)/DECORTICATION;  Surgeon: Grace Isaac, MD;  Location: [facility];  Service: Thoracic;  Laterality: Right;   VIDEO ASSISTED THORACOSCOPY (VATS)/EMPYEMA     06/23/2012   VIDEO BRONCHOSCOPY  06/23/2012   Procedure: VIDEO BRONCHOSCOPY;  Surgeon: Grace Isaac, MD;  Location: [facility];  Service: Thoracic;  Laterality: N/A;    Allergies  Allergen Reactions   Actonel [Risedronate Sodium] Other (See Comments)    Joint aches; rechallenged --caused joint aches   Ivp Dye [Iodinated Contrast Media] Nausea And Vomiting    Allergies as of 07/23/2021       Reactions   Actonel [risedronate Sodium] Other (See Comments)   Joint aches; rechallenged --caused joint aches   Ivp Dye [iodinated Contrast Media] Nausea And Vomiting        Medication List        Accurate as of July 23, 2021  9:15 AM. If you have any questions, ask your nurse or doctor.          acetaminophen 500 MG tablet Commonly known as: TYLENOL Take 500 mg by mouth every 6 (six) hours as needed.   acetaminophen 325 MG tablet Commonly known as: TYLENOL Take 650 mg by mouth 3 (three) times daily.   Calcium Carbonate 500 MG Chew Chew 1,000 mg by mouth daily.   cholecalciferol 25 MCG (1000 UNIT) tablet Commonly known as: VITAMIN D3 Take 1,000 Units by mouth daily.   [employer] JOINT HEALTH TRIPLE ACTION PO Take 1 tablet by mouth daily.   famotidine 20 MG tablet Commonly known as: PEPCID Take 1 tablet (20 mg total) by mouth daily.   feeding supplement (PRO-STAT SUGAR FREE 64) Liqd Take 30 mLs by mouth 2 (two) times daily.   HYDROcodone-acetaminophen 5-325 MG tablet Commonly known as: NORCO/VICODIN Take 0.5 tablets  by mouth daily. 9:00 PM   HYDROcodone-acetaminophen 5-325 MG tablet Commonly known as: NORCO/VICODIN Take 0.5 tablets by mouth in the morning, at noon, in the evening, and at bedtime. 03:00 AM, 07:00 AM, 12:00 PM, 04:00 PM   Iron-Vitamin C 65-125 MG Tabs Take 1 tablet by mouth in the morning.   Lidocaine 4 % Ptch Apply 1 patch topically in the morning and at bedtime.   NON FORMULARY Take 900 mg by mouth daily. Heal and Soothe capsule; 300 mg; amt: 900 mg; oral Once A Day   OCUVITE EYE + MULTI PO Take 1 tablet by mouth daily.   multivitamin with minerals tablet Take  1 tablet by mouth daily.   polyethylene glycol 17 g packet Commonly known as: MIRALAX / GLYCOLAX Take 17 g by mouth daily.   potassium chloride 10 MEQ tablet Commonly known as: KLOR-CON M Take 20 mEq by mouth 3 (three) times daily.   predniSONE 10 MG tablet Commonly known as: DELTASONE Take 10 mg by mouth daily.   torsemide 20 MG tablet Commonly known as: DEMADEX Take 20 mg by mouth daily.   zinc oxide 20 % ointment Apply 1 application topically as needed for irritation. To buttocks after every incontinent episode and as needed for redness. May keep at bedside.        Review of Systems  Constitutional:  Positive for activity change, appetite change and unexpected weight change.  HENT: Negative.    Respiratory: Negative.  Negative for cough and shortness of breath.   Cardiovascular:  Positive for leg swelling.  Gastrointestinal:  Negative for constipation.  Genitourinary:  Positive for frequency.  Musculoskeletal:  Positive for back pain and gait problem. Negative for arthralgias and myalgias.  Skin:  Positive for rash and wound.  Neurological:  Positive for weakness. Negative for dizziness.  Psychiatric/Behavioral:  Positive for confusion and sleep disturbance. Negative for dysphoric mood.    Immunization History  Administered Date(s) Administered   Influenza Split 09/11/2012, 04/12/2013,  04/30/2015   Influenza, High Dose Seasonal PF 04/18/2017, 04/25/2019   Influenza,inj,Quad PF,6+ Mos 04/13/2018   Influenza-Unspecified 09/19/2012, 05/03/2014, 04/22/2016, 04/20/2017, 04/22/2020, 04/30/2021   Moderna SARS-COV2 Booster Vaccination 12/09/2020   Moderna Sars-Covid-2 Vaccination 07/16/2019, 08/13/2019, 05/20/2020, 03/31/2021   Pneumococcal Conjugate-13 04/16/2015   Pneumococcal Polysaccharide-23 07/12/2004   Td 01/10/2012   Tdap 11/05/2015   Zoster Recombinat (Shingrix) 02/07/2016   Pertinent  Health Maintenance Due  Topic Date Due   INFLUENZA VACCINE  Completed   DEXA SCAN  Completed   Fall Risk 10/21/2019 10/22/2019 10/23/2019 06/11/2021 06/17/2021  Falls in the past year? - - 0 1 -  Number of falls in past year - - - - -  Was there an injury with Fall? - - - 0 -  Fall Risk Category Calculator - - - 2 -  Fall Risk Category - - - Moderate -  Patient Fall Risk Level High fall risk High fall risk - High fall risk High fall risk   Functional Status Survey:    Vitals:   07/23/21 0902  BP: 121/78  Pulse: (!) 103  Temp: (!) 97.5 F (36.4 C)  SpO2: 94%  Weight: 136 lb 12.8 oz (62.1 kg)  Height: 5' (1.524 m)   Body mass index is 26.72 kg/m. Physical Exam Vitals reviewed.  Constitutional:      Appearance: Normal appearance.  HENT:     Head: Normocephalic.     Nose: Nose normal.     Mouth/Throat:     Mouth: Mucous membranes are moist.     Pharynx: Oropharynx is clear.  Eyes:     Pupils: Pupils are equal, round, and reactive to light.  Cardiovascular:     Rate and Rhythm: Normal rate and regular rhythm.     Pulses: Normal pulses.  Pulmonary:     Effort: Pulmonary effort is normal.     Breath sounds: Normal breath sounds.  Abdominal:     General: Abdomen is flat. Bowel sounds are normal.     Palpations: Abdomen is soft.  Musculoskeletal:        General: No swelling.     Cervical back: Neck supple.  Skin:  General: Skin is warm.     Comments: Macular  Rash in her Groin area Has 2 healed and scabbed area in her Bottom  Neurological:     General: No focal deficit present.     Mental Status: She is alert and oriented to person, place, and time.     Comments: Able to stand with her walker  Psychiatric:        Mood and Affect: Mood normal.        Thought Content: Thought content normal.    Labs reviewed: Recent Labs    10/30/20 1203 03/03/21 0000 03/19/21 0000 05/11/21 1556 06/17/21 1144  NA 142   < > 144 142 140  K 4.0   < > 3.9   3.6 4.0 3.3*  CL 106   < > 104 99 101  CO2 27   < > 34* 29 32  GLUCOSE 127*  --   --  125* 116*  BUN 18   < > 23* 19 20  CREATININE 0.74   < > 0.7 0.75 0.67  CALCIUM 8.8*   < > 9.4 9.9 9.5   < > = values in this interval not displayed.   Recent Labs    10/04/20 0000 03/03/21 0000 03/19/21 0000  AST 13 13 12*  ALT 9 10 7   ALKPHOS 48 54 70  ALBUMIN 3.5 3.3* 3.2*   Recent Labs    10/04/20 0000 10/30/20 1203 03/03/21 0000 03/19/21 0000 05/11/21 1556 06/17/21 1144  WBC 5.7 7.8 5.9 4.8 7.0 7.8  NEUTROABS 3,295.00  --  3,180.00 2,534.00  --   --   HGB 12.7 13.6 12.3 11.6* 12.6 13.1  HCT 38 42.9 36 35* 36.3 41.2  MCV  --  106.5*  --   --  96 105.6*  PLT 130* 124* 109* 118* 180 145*   Lab Results  Component Value Date   TSH 0.50 03/03/2021   Lab Results  Component Value Date   HGBA1C 5.3 03/03/2021   Lab Results  Component Value Date   CHOL 146 03/03/2021   HDL 51 03/03/2021   LDLCALC 80 03/03/2021   TRIG 71 03/03/2021   CHOLHDL 3.3 02/26/2019    Significant Diagnostic Results in last 30 days:  CT HEAD WO CONTRAST (5MM)  Result Date: 07/15/2021 CLINICAL DATA:  Visual hallucination. EXAM: CT HEAD WITHOUT CONTRAST TECHNIQUE: Contiguous axial images were obtained from the base of the skull through the vertex without intravenous contrast. COMPARISON:  None. FINDINGS: Brain: Mild cerebral atrophy, less than expected for age. Negative for hydrocephalus. Hypodensity right middle  frontal lobe most compatible with chronic infarction. Negative for acute infarct, hemorrhage, mass. Vascular: Negative for hyperdense vessel Skull: Negative Sinuses/Orbits: Paranasal sinuses clear. Bilateral cataract extraction Other: None IMPRESSION: Chronic infarct right frontal lobe.  No acute abnormality. Electronically Signed   By: Franchot Gallo M.D.   On: 07/15/2021 15:44   CUP PACEART INCLINIC DEVICE CHECK  Result Date: 07/01/2021 Wound check appointment. Remaining dermabond removed. Wound without redness or edema. Incision edges approximated, wound well healed. Normal device function. Thresholds, sensing, and impedances consistent with implant measurements. Device programmed at appropriate chronic safety margin. Histogram distribution appropriate for patient and level of activity. No mode switches or high ventricular rates noted. Patient educated about wound care, arm mobility, lifting restrictions. ROV in 3 months with Dr. Caryl Comes   Assessment/Plan Chronic midline low back pain without sciatica Discussed with the daughter Will discontinue Norco due to Tylenol [place] Start on Oxycodone 2.5  mg 3 times a day and 5 mg BID  Visual hallucination Most likely due to Steroids CT scan did not show any new finding  Chronic diastolic congestive heart failure (HCC) On Torsemide EF 55% Polymyalgia rheumatica (HCC) On Prednisone Has failed taper many times  Achalasia of esophagus Continues to loose weight  Palliative consult  Obstructive sleep apnea syndrome Doing well with CPAP Cardiac pacemaker in situ Stable pacemaker generator replacement on 12/22 Pressure injury of buttock, stage 2, unspecified laterality (HCC) Improved Use Nystatin for the rash Macular Degeneration  Family/ staff Communication:   Labs/tests ordered:    Total time spent in this patient care encounter was  45_  minutes; greater than 50% of the visit spent counseling patient, Daughter  and staff, reviewing records  , Labs and coordinating care for problems addressed at this encounter.

## 2021-07-26 ENCOUNTER — Encounter: Payer: Self-pay | Admitting: Internal Medicine

## 2021-07-28 DIAGNOSIS — M25551 Pain in right hip: Secondary | ICD-10-CM | POA: Diagnosis not present

## 2021-07-28 DIAGNOSIS — M1611 Unilateral primary osteoarthritis, right hip: Secondary | ICD-10-CM | POA: Diagnosis not present

## 2021-07-28 DIAGNOSIS — R102 Pelvic and perineal pain: Secondary | ICD-10-CM | POA: Diagnosis not present

## 2021-07-28 DIAGNOSIS — Z96641 Presence of right artificial hip joint: Secondary | ICD-10-CM | POA: Diagnosis not present

## 2021-07-30 DIAGNOSIS — F4321 Adjustment disorder with depressed mood: Secondary | ICD-10-CM | POA: Diagnosis not present

## 2021-08-10 ENCOUNTER — Telehealth: Payer: Self-pay

## 2021-08-10 ENCOUNTER — Telehealth: Payer: Self-pay | Admitting: *Deleted

## 2021-08-10 NOTE — Telephone Encounter (Signed)
Attempted to review transmission. No answer, LMTCB.  Patient is now back in SR. AT/AF burden 2.3%. Longest time in AF was 42 minutes.

## 2021-08-10 NOTE — Telephone Encounter (Signed)
Alert remote reviewed. Normal device function.   Ongoing AF since 08/07/2021 at 1330.  There is not good ventricular rate control as rates are 130-180 bpm. There is some intermittent atrial undersensing.  The fast A&V episodes and NSVT are AF with RVR.  ? [place].  Sent to triage per protocol.  Called to have patient send manual transmission to review current presenting.   Spoke to daughter on [place], states she lives 3.5 hours away, will call nursing home and have someone there call us for assistance to send transmission. Direct phone number given.

## 2021-08-10 NOTE — Telephone Encounter (Signed)
Patient daughter, Magdalen Spatz called and stated that Dr. Erick Alley (Cardiologist) office called her and stated that patient had a run of AFIB on Friday when the transmission was sent. They called daughter wanting a transmission sent today.   Daughter called [facility] and left message with Burke Keels to send the transmission.   Daughter wanted to make Dr. Lyndel Safe aware because she knows patient's Potassium gets low with AFIB and she wanted to make sure the Transmission gets done.   FYI

## 2021-08-10 NOTE — Telephone Encounter (Signed)
Nursing home called and I have walked them through how to send a transmission and it was sent successfully

## 2021-08-11 ENCOUNTER — Telehealth: Payer: Self-pay

## 2021-08-11 ENCOUNTER — Encounter: Payer: Self-pay | Admitting: Internal Medicine

## 2021-08-11 NOTE — Telephone Encounter (Signed)
Patient daughter [name] [employer]PO called because she wants the nurse to review the transmission and give her a call back. She wants to make sure her mom is not in a prolong A-fib episode. Her phone number is [phone].

## 2021-08-11 NOTE — Telephone Encounter (Signed)
Unsuccessful telephone encounter to patient's daughter at provided number to follow up on manual transmission to assess for ongoing AF. Patient in [place] with PACs. [employer] compliant VM message left on given number requesting call back to device clinic at [phone].

## 2021-08-12 DIAGNOSIS — E876 Hypokalemia: Secondary | ICD-10-CM | POA: Diagnosis not present

## 2021-08-12 DIAGNOSIS — I5032 Chronic diastolic (congestive) heart failure: Secondary | ICD-10-CM | POA: Diagnosis not present

## 2021-08-12 DIAGNOSIS — I491 Atrial premature depolarization: Secondary | ICD-10-CM | POA: Diagnosis not present

## 2021-08-12 LAB — CBC: RBC: 3.13 — AB (ref 3.87–5.11)

## 2021-08-12 LAB — CBC AND DIFFERENTIAL
HCT: 32 — AB (ref 36–46)
Hemoglobin: 10.8 — AB (ref 12.0–16.0)
Neutrophils Absolute: 4733
Platelets: 161 (ref 150–399)
WBC: 6.9

## 2021-08-12 LAB — BASIC METABOLIC PANEL
BUN: 26 — AB (ref 4–21)
CO2: 29 — AB (ref 13–22)
Chloride: 99 (ref 99–108)
Creatinine: 0.7 (ref 0.5–1.1)
Glucose: 172
Potassium: 4.1 (ref 3.4–5.3)
Sodium: 139 (ref 137–147)

## 2021-08-12 LAB — COMPREHENSIVE METABOLIC PANEL
Albumin: 3.5 (ref 3.5–5.0)
Calcium: 9.6 (ref 8.7–10.7)

## 2021-08-12 LAB — HEPATIC FUNCTION PANEL
ALT: 14 (ref 7–35)
AST: 16 (ref 13–35)
Alkaline Phosphatase: 96 (ref 25–125)
Bilirubin, Total: 0.4

## 2021-08-12 LAB — TSH: TSH: 0.69 (ref 0.41–5.90)

## 2021-08-17 ENCOUNTER — Other Ambulatory Visit: Payer: Self-pay | Admitting: *Deleted

## 2021-08-17 NOTE — Telephone Encounter (Signed)
[facility] requested Rx.  Pended Rx and sent to Amy for approval.

## 2021-08-18 ENCOUNTER — Encounter: Payer: Self-pay | Admitting: Family Medicine

## 2021-08-18 ENCOUNTER — Non-Acute Institutional Stay: Payer: Self-pay | Admitting: Family Medicine

## 2021-08-18 ENCOUNTER — Other Ambulatory Visit: Payer: Self-pay

## 2021-08-18 DIAGNOSIS — R2689 Other abnormalities of gait and mobility: Secondary | ICD-10-CM | POA: Diagnosis not present

## 2021-08-18 DIAGNOSIS — I5022 Chronic systolic (congestive) heart failure: Secondary | ICD-10-CM | POA: Diagnosis not present

## 2021-08-18 DIAGNOSIS — R2681 Unsteadiness on feet: Secondary | ICD-10-CM | POA: Diagnosis not present

## 2021-08-18 DIAGNOSIS — Z515 Encounter for palliative care: Secondary | ICD-10-CM

## 2021-08-18 DIAGNOSIS — M353 Polymyalgia rheumatica: Secondary | ICD-10-CM | POA: Diagnosis not present

## 2021-08-18 DIAGNOSIS — K220 Achalasia of cardia: Secondary | ICD-10-CM | POA: Diagnosis not present

## 2021-08-18 DIAGNOSIS — M6281 Muscle weakness (generalized): Secondary | ICD-10-CM | POA: Diagnosis not present

## 2021-08-18 MED ORDER — OXYCODONE HCL 5 MG PO CAPS: ORAL_CAPSULE | ORAL | 0 refills | Status: AC

## 2021-08-18 NOTE — Progress Notes (Signed)
[facility] Palliative Care Consult Note Telephone: [phone]  Fax: [phone]   Date of encounter: 08/18/21 10:56 PM PATIENT NAME: [name] [address] [place] [address]   [phone] (home)  DOB: 1928/12/09 MRN: [id number] PRIMARY CARE PROVIDER:    [name], MD,  [place] [id number]  REFERRING PROVIDER:   [name], MD [address] [place],  [place] [address] [phone]  RESPONSIBLE PARTY:    Contact Information     Name Relation Home Work Mobile   Milazzo,Dorothy Daughter [phone]  [phone]   Maeda,Mariette Daughter [phone]  [phone]   Raysha, Tilmon   [phone]   Burbribge,Annie Daughter   [phone]        I met face to face with patient in skilled care at [facility] facility. Palliative Care was asked to follow this patient by consultation request of  [name], MD to address advance care planning and complex medical decision making. This is the initial visit.          ASSESSMENT, SYMPTOM MANAGEMENT AND PLAN / RECOMMENDATIONS:   Palliative Care Encounter Educated pt on services provided through [facility] with assistance on advanced care planning and goals of care. Plan to return in 2 weeks to discover if pt wants to be admitted to Palliative Service Per notes in the chart, it appears that daughter was anticipating [place] but since pt is A & O x 3 and declined contact, I am currently unable to follow up with family per pt request. Will notify attending provider.  Follow up Palliative Care Visit: Palliative care will continue follow up in 2 weeks to see if pt interested in setting up for service. Return 2 weeks or prn.    This visit was coded based on medical decision making (MDM).  PPS: 60%  HOSPICE ELIGIBILITY/DIAGNOSIS: TBD  Chief Complaint:  [facility] received  a referral to follow up with patient for chronic disease management, advance directive and defining/refining goals of care.   HISTORY OF PRESENT ILLNESS:  [name] is a 88 y.o. year old female who is living in [facility] with hx of HTN, Sick sinus syndrome s/p pacemaker, gastric AVM, Paroxysmal ventricular tachycardia, CHF, OSA on CPAP, esophageal dysphagia/megaesophagus and achalasia of esophagus, constipation, reflux. Multinodular thyroid, chemical DM and arthritis, PMR, bursitis, rotator cuff tendinopathy, thrombocytopenia, pressure ulcer of buttock and visual hallucinations and hx of Covid 19 infection in May 2022.  Met with pt to explain [facility] Palliative services to patient and answer questions. Advised about continuum of care from health to chronic conditions with symptom management by Palliative to terminal diagnosis with functional decline needing Hospice services.  Advised this is covered under her insurance benefit and she can continue to receive primary care through the facility.  Pt states she would like time to continue thinking about it but does not "want to feel pressured into doing anything."  Advised pt program is totally voluntary and I could get VS and assessment then talk with her daughters who she states are her [facility] POA.  Pt did not want NP to speak with family but wanted time to consider if she truly wanted Palliative support and was agreeable to NP leaving informational brochure for her to look over.  Pt agreeable to NP returning in 1-2 weeks to review information about Palliative follow up after pt had a chance to  review materials and talk with her daughters. Pt denies CP, SOB, nausea, constipation, difficulty with coughing/choking (facility staff) and good appetite.  Facility staff indicated that pt's diet was recently advanced to mechanical soft from pureed and pt's appetite and intake had improved.   History obtained from review of EMR, and interview with  [name].  I reviewed available labs, medications, imaging, studies and related documents from the EMR.  Records reviewed and summarized above.   ROS General: NAD EYES: denies vision changes ENMT: denies dysphagia Cardiovascular: denies chest pain, denies DOE, orthopnea and PND Pulmonary: denies cough, denies increased SOB Abdomen: endorses good appetite, denies constipation, endorses continence of bowel GU: denies dysuria, endorses continence of urine MSK:  no falls reported Skin: denies rashes or wounds Neurological: denies pain, denies insomnia Psych: Endorses positive mood Heme/lymph/immuno: denies bruises, abnormal bleeding  Physical Exam: Limited to observation only as pt declined exam and VS Current and past weights: 07/23/2021 weight 136 lbs 12.8 ounces, 05/11/2021 weight 152 lbs 8 ounces Constitutional: NAD General: WNWD  EYES: anicteric sclera, lids intact, no discharge  ENMT: intact hearing, oral mucous membranes moist, dentition intact CV:  Able to speak in complete sentences without having to stop to breathe, no visible cyanosis Pulmonary: no observed increased work of breathing, no cough, room air and no wheezing audible Abdomen: no observed distension GU: deferred MSK: moves all extremities-seen sitting in WC Skin: warm and dry, no wounds on visible skin Neuro:  no cognitive impairment Psych: non-anxious affect, A and O x 3 Hem/lymph/immuno: no widespread bruising  CURRENT PROBLEM LIST:  Patient Active Problem List   Diagnosis Date Noted   Decreased functional mobility 06/22/2021   Redness of skin 05/15/2021   Pain in lower back 04/17/2021   Tremor 03/02/2021   Anxiety 03/02/2021   Dry skin 12/19/2020   Lab test positive for detection of COVID-19 virus 11/12/2020   Chest pain 10/30/2020   Dysuria 10/02/2020   Slow transit constipation 10/02/2020   Visual hallucination 08/01/2020   Weight loss 10/23/2019   CHF (congestive heart failure) ([place]) 10/18/2019    Pressure ulcer, buttock 10/18/2019   Thrombocytopenia ([place]) 10/18/2019   Hypokalemia 10/18/2019   Age-related osteoporosis without current pathological fracture 08/31/2017   Overweight 08/31/2017   Callus 08/31/2017   S/P placement of cardiac pacemaker 04/27/2017   Hyperlipidemia 04/27/2017   Atrophic vaginitis 04/27/2017   Estrogen deficiency 04/27/2017   Bezoar    Actinic keratoses 07/06/2016   Chemical diabetes 08/26/2015   Abnormal chest x-ray 08/18/2015   Right inguinal hernia 04/08/2015   Pain of right scapula 04/02/2014   Osteoporosis 11/13/2013   Urinary incontinence 11/13/2013   Hyperglycemia 07/17/2013   Intrinsic sphincter deficiency 05/24/2013   Mixed incontinence urge and stress 05/24/2013   Reflux esophagitis 01/28/2013   Sinoatrial node dysfunction ([place]) 01/05/2013   Sick sinus syndrome ([place]) 01/05/2013   Sleep apnea 11/21/2012   Disorder of bone and cartilage 11/21/2012   Obesity 11/21/2012   Edema 11/21/2012   Personal history of other diseases of circulatory system 11/21/2012   Achalasia of esophagus 10/10/2012   Gastric AVM 08/07/2012   Esophageal dysphagia 06/27/2012   Multinodular thyroid 06/26/2012   Megaesophagus 06/20/2012   Polymyalgia rheumatica ([place]) 06/19/2012   Bilateral pleural effusion 06/19/2012   Mediastinal mass 06/19/2012   Hammer toe 04/17/2012   Long term current use of systemic steroids 01/27/2012   Paroxysmal ventricular tachycardia 01/18/2012   Cardiac pacemaker in situ 01/18/2012   Nonsustained ventricular tachycardia 01/18/2012  Disorder of rotator cuff syndrome of shoulder and allied disorder 04/01/2011   Bursitis of shoulder 04/01/2011   PAST MEDICAL HISTORY:  Active Ambulatory Problems    Diagnosis Date Noted   Polymyalgia rheumatica ([place]) 06/19/2012   Bilateral pleural effusion 06/19/2012   Mediastinal mass 06/19/2012   Megaesophagus 06/20/2012   Multinodular thyroid 06/26/2012   Esophageal dysphagia 06/27/2012    Gastric AVM 08/07/2012   Disorder of rotator cuff syndrome of shoulder and allied disorder 04/01/2011   Sleep apnea 11/21/2012   Disorder of bone and cartilage 11/21/2012   Obesity 11/21/2012   Edema 11/21/2012   Paroxysmal ventricular tachycardia 01/18/2012   Personal history of other diseases of circulatory system 11/21/2012   Cardiac pacemaker in situ 01/18/2012   Reflux esophagitis 01/28/2013   Achalasia of esophagus 10/10/2012   Sinoatrial node dysfunction (HCC) 01/05/2013   Intrinsic sphincter deficiency 05/24/2013   Mixed incontinence urge and stress 05/24/2013   Hyperglycemia 07/17/2013   Hammer toe 04/17/2012   Osteoporosis 11/13/2013   Sick sinus syndrome ([place]) 01/05/2013   Urinary incontinence 11/13/2013   Long term current use of systemic steroids 01/27/2012   Pain of right scapula 04/02/2014   Right inguinal hernia 04/08/2015   Chemical diabetes 08/26/2015   Nonsustained ventricular tachycardia 01/18/2012   Bursitis of shoulder 04/01/2011   Abnormal chest x-ray 08/18/2015   Actinic keratoses 07/06/2016   Bezoar    S/P placement of cardiac pacemaker 04/27/2017   Hyperlipidemia 04/27/2017   Atrophic vaginitis 04/27/2017   Estrogen deficiency 04/27/2017   Age-related osteoporosis without current pathological fracture 08/31/2017   Overweight 08/31/2017   Callus 08/31/2017   CHF (congestive heart failure) ([place]) 10/18/2019   Pressure ulcer, buttock 10/18/2019   Thrombocytopenia ([place]) 10/18/2019   Hypokalemia 10/18/2019   Weight loss 10/23/2019   Visual hallucination 08/01/2020   Dysuria 10/02/2020   Slow transit constipation 10/02/2020   Chest pain 10/30/2020   Lab test positive for detection of COVID-19 virus 11/12/2020   Dry skin 12/19/2020   Tremor 03/02/2021   Anxiety 03/02/2021   Pain in lower back 04/17/2021   Redness of skin 05/15/2021   Decreased functional mobility 06/22/2021   Resolved Ambulatory Problems    Diagnosis Date Noted   Pacemaker  06/19/2012   Aspiration into respiratory tract 06/20/2012   Empyema ([place]) 06/22/2012   Aspiration pneumonia ([place]) 06/27/2012   PNA (pneumonia)    Empyema, right ([place])    Sprain and strain of wrist 05/10/2011   Urinary incontinence 11/21/2012   Encounter for long-term (current) use of steroids 01/27/2012   Abnormal glucose tolerance test 11/21/2012   Essential hypertension 11/21/2012   Hammer toe, acquired 04/17/2012   Fibromyalgia 01/28/2013   Nontoxic uninodular goiter 01/28/2013   Anemia 07/17/2013   History of myocardial problem 11/13/2013   Non-sustained ventricular tachycardia ([place]) 01/18/2012   BP (high blood pressure) 11/13/2013   Edema leg 11/13/2013   Disease of skin and subcutaneous tissue 04/17/2012   Pain in toe 04/17/2012   Post-operative state 04/28/2012   Bursitis, scapulohumeral 04/01/2011   Idiopathic angio-edema-urticaria 11/13/2013   Sprain of wrist 05/10/2011   Fitting and adjustment of cardiac pacemaker 03/12/2014   Empyema ([place]) 08/17/2012   Wound, open, finger 11/25/2015   Achalasia    Scalp hematoma 03/02/2021   Past Medical History:  Diagnosis Date   Arthritis    Chronic steroid use    Complete heart block (HCC)    GERD (gastroesophageal reflux disease)    History of aspiration pneumonitis  History of hiatal hernia    Hypertension    Inguinal hernia    Intrinsic urethral sphincter deficiency    Mixed stress and urge urinary incontinence    OSA on CPAP    RBBB    S/P dilatation of esophageal stricture    SOCIAL HX:  Social History   Tobacco Use   Smoking status: Never    Passive exposure: Yes   Smokeless tobacco: Never   Tobacco comments:    Exposure through father only.  Substance Use Topics   Alcohol use: Yes    Comment: occ   FAMILY HX:  Family History  Problem Relation Age of Onset   Arthritis Father    Heart disease Father    Cancer Brother        ?spine   Heart disease Brother    Lung cancer Brother    Heart disease  Mother        Congestive heart failure   Heart disease Brother    Arthritis Brother    Hypertension Brother    Colon cancer Neg Hx        Preferred Pharmacy: ALLERGIES:  Allergies  Allergen Reactions   Actonel [Risedronate Sodium] Other (See Comments)    Joint aches; rechallenged --caused joint aches   Ivp Dye [Iodinated Contrast Media] Nausea And Vomiting     PERTINENT MEDICATIONS:  Outpatient Encounter Medications as of 08/18/2021  Medication Sig   acetaminophen (TYLENOL) 325 MG tablet Take 650 mg by mouth 3 (three) times daily.   acetaminophen (TYLENOL) 500 MG tablet Take 500 mg by mouth every 6 (six) hours as needed.   Amino Acids-Protein Hydrolys (FEEDING SUPPLEMENT, PRO-STAT SUGAR FREE 64,) LIQD Take 30 mLs by mouth 2 (two) times daily.   Calcium Carbonate 500 MG CHEW Chew 1,000 mg by mouth daily.   cholecalciferol (VITAMIN D3) 25 MCG (1000 UNIT) tablet Take 1,000 Units by mouth daily.   Collagen-Boron-Hyaluronic Acid ([employer] JOINT HEALTH TRIPLE ACTION PO) Take 1 tablet by mouth daily.   famotidine (PEPCID) 20 MG tablet Take 1 tablet (20 mg total) by mouth daily.   Iron-Vitamin C 65-125 MG TABS Take 1 tablet by mouth in the morning.   Lidocaine 4 % PTCH Apply 1 patch topically in the morning and at bedtime.   Multiple Vitamins-Minerals (MULTIVITAMIN WITH MINERALS) tablet Take 1 tablet by mouth daily.   Multiple Vitamins-Minerals (OCUVITE EYE + MULTI PO) Take 1 tablet by mouth daily.    NON FORMULARY Take 900 mg by mouth daily. Heal and Soothe capsule; 300 mg; amt: 900 mg; oral Once A Day   oxycodone (OXY-IR) 5 MG capsule Take 2.5 mg by mouth 3 (three) times daily.   oxycodone (OXY-IR) 5 MG capsule Take one tablet by mouth twice daily at 7am and 9pm; Take 1/2 tablet by mouth three times daily at 1pm,4pm and 3am.   polyethylene glycol (MIRALAX / GLYCOLAX) 17 g packet Take 17 g by mouth daily.   potassium chloride (KLOR-CON M) 10 MEQ tablet Take 20 mEq by mouth 3 (three) times  daily.   predniSONE (DELTASONE) 10 MG tablet Take 10 mg by mouth daily.   torsemide (DEMADEX) 20 MG tablet Take 20 mg by mouth daily.   zinc oxide 20 % ointment Apply 1 application topically as needed for irritation. To buttocks after every incontinent episode and as needed for redness. May keep at bedside.   No facility-administered encounter medications on file as of 08/18/2021.     Advance Care Planning/Goals of  Care:   CODE STATUS: DNR  MOST as of 01/25/2018: DNR/DNI Limited intervention Antibiotics and IV fluids if indicated Feeding tube for a defined trial period   Thank you for the opportunity to participate in the care of Ms. Lolli.  The palliative care team will continue to follow. Please call our office at [phone] if we can be of additional assistance.   Marijo Conception, FNP-C  COVID-19 PATIENT SCREENING TOOL Asked and negative response unless otherwise noted:  Have you had symptoms of covid, tested positive or been in contact with someone with symptoms/positive test in the past 5-10 days? unknown

## 2021-08-19 DIAGNOSIS — I5022 Chronic systolic (congestive) heart failure: Secondary | ICD-10-CM | POA: Diagnosis not present

## 2021-08-19 DIAGNOSIS — M6281 Muscle weakness (generalized): Secondary | ICD-10-CM | POA: Diagnosis not present

## 2021-08-19 DIAGNOSIS — K220 Achalasia of cardia: Secondary | ICD-10-CM | POA: Diagnosis not present

## 2021-08-19 DIAGNOSIS — R2689 Other abnormalities of gait and mobility: Secondary | ICD-10-CM | POA: Diagnosis not present

## 2021-08-19 DIAGNOSIS — M353 Polymyalgia rheumatica: Secondary | ICD-10-CM | POA: Diagnosis not present

## 2021-08-19 DIAGNOSIS — R2681 Unsteadiness on feet: Secondary | ICD-10-CM | POA: Diagnosis not present

## 2021-08-20 DIAGNOSIS — I5022 Chronic systolic (congestive) heart failure: Secondary | ICD-10-CM | POA: Diagnosis not present

## 2021-08-20 DIAGNOSIS — M6281 Muscle weakness (generalized): Secondary | ICD-10-CM | POA: Diagnosis not present

## 2021-08-20 DIAGNOSIS — R2689 Other abnormalities of gait and mobility: Secondary | ICD-10-CM | POA: Diagnosis not present

## 2021-08-20 DIAGNOSIS — K220 Achalasia of cardia: Secondary | ICD-10-CM | POA: Diagnosis not present

## 2021-08-20 DIAGNOSIS — M353 Polymyalgia rheumatica: Secondary | ICD-10-CM | POA: Diagnosis not present

## 2021-08-20 DIAGNOSIS — R2681 Unsteadiness on feet: Secondary | ICD-10-CM | POA: Diagnosis not present

## 2021-08-21 DIAGNOSIS — K220 Achalasia of cardia: Secondary | ICD-10-CM | POA: Diagnosis not present

## 2021-08-21 DIAGNOSIS — R2681 Unsteadiness on feet: Secondary | ICD-10-CM | POA: Diagnosis not present

## 2021-08-21 DIAGNOSIS — M353 Polymyalgia rheumatica: Secondary | ICD-10-CM | POA: Diagnosis not present

## 2021-08-21 DIAGNOSIS — M6281 Muscle weakness (generalized): Secondary | ICD-10-CM | POA: Diagnosis not present

## 2021-08-21 DIAGNOSIS — R2689 Other abnormalities of gait and mobility: Secondary | ICD-10-CM | POA: Diagnosis not present

## 2021-08-21 DIAGNOSIS — I5022 Chronic systolic (congestive) heart failure: Secondary | ICD-10-CM | POA: Diagnosis not present

## 2021-08-24 DIAGNOSIS — M6281 Muscle weakness (generalized): Secondary | ICD-10-CM | POA: Diagnosis not present

## 2021-08-24 DIAGNOSIS — R2689 Other abnormalities of gait and mobility: Secondary | ICD-10-CM | POA: Diagnosis not present

## 2021-08-24 DIAGNOSIS — I5022 Chronic systolic (congestive) heart failure: Secondary | ICD-10-CM | POA: Diagnosis not present

## 2021-08-24 DIAGNOSIS — K220 Achalasia of cardia: Secondary | ICD-10-CM | POA: Diagnosis not present

## 2021-08-24 DIAGNOSIS — M353 Polymyalgia rheumatica: Secondary | ICD-10-CM | POA: Diagnosis not present

## 2021-08-24 DIAGNOSIS — R2681 Unsteadiness on feet: Secondary | ICD-10-CM | POA: Diagnosis not present

## 2021-08-25 DIAGNOSIS — K220 Achalasia of cardia: Secondary | ICD-10-CM | POA: Diagnosis not present

## 2021-08-25 DIAGNOSIS — R2681 Unsteadiness on feet: Secondary | ICD-10-CM | POA: Diagnosis not present

## 2021-08-25 DIAGNOSIS — I5022 Chronic systolic (congestive) heart failure: Secondary | ICD-10-CM | POA: Diagnosis not present

## 2021-08-25 DIAGNOSIS — R2689 Other abnormalities of gait and mobility: Secondary | ICD-10-CM | POA: Diagnosis not present

## 2021-08-25 DIAGNOSIS — M353 Polymyalgia rheumatica: Secondary | ICD-10-CM | POA: Diagnosis not present

## 2021-08-25 DIAGNOSIS — M6281 Muscle weakness (generalized): Secondary | ICD-10-CM | POA: Diagnosis not present

## 2021-08-26 DIAGNOSIS — M6281 Muscle weakness (generalized): Secondary | ICD-10-CM | POA: Diagnosis not present

## 2021-08-26 DIAGNOSIS — M353 Polymyalgia rheumatica: Secondary | ICD-10-CM | POA: Diagnosis not present

## 2021-08-26 DIAGNOSIS — R2689 Other abnormalities of gait and mobility: Secondary | ICD-10-CM | POA: Diagnosis not present

## 2021-08-26 DIAGNOSIS — K220 Achalasia of cardia: Secondary | ICD-10-CM | POA: Diagnosis not present

## 2021-08-26 DIAGNOSIS — I5022 Chronic systolic (congestive) heart failure: Secondary | ICD-10-CM | POA: Diagnosis not present

## 2021-08-26 DIAGNOSIS — R2681 Unsteadiness on feet: Secondary | ICD-10-CM | POA: Diagnosis not present

## 2021-08-27 ENCOUNTER — Encounter: Payer: Self-pay | Admitting: Internal Medicine

## 2021-08-27 ENCOUNTER — Non-Acute Institutional Stay (SKILLED_NURSING_FACILITY): Payer: Medicare Other | Admitting: Internal Medicine

## 2021-08-27 DIAGNOSIS — M353 Polymyalgia rheumatica: Secondary | ICD-10-CM | POA: Diagnosis not present

## 2021-08-27 DIAGNOSIS — M545 Low back pain, unspecified: Secondary | ICD-10-CM | POA: Diagnosis not present

## 2021-08-27 DIAGNOSIS — R110 Nausea: Secondary | ICD-10-CM | POA: Diagnosis not present

## 2021-08-27 DIAGNOSIS — G4733 Obstructive sleep apnea (adult) (pediatric): Secondary | ICD-10-CM | POA: Diagnosis not present

## 2021-08-27 DIAGNOSIS — H353212 Exudative age-related macular degeneration, right eye, with inactive choroidal neovascularization: Secondary | ICD-10-CM | POA: Diagnosis not present

## 2021-08-27 DIAGNOSIS — K220 Achalasia of cardia: Secondary | ICD-10-CM | POA: Diagnosis not present

## 2021-08-27 DIAGNOSIS — G8929 Other chronic pain: Secondary | ICD-10-CM

## 2021-08-27 DIAGNOSIS — I5032 Chronic diastolic (congestive) heart failure: Secondary | ICD-10-CM

## 2021-08-27 DIAGNOSIS — R5383 Other fatigue: Secondary | ICD-10-CM | POA: Diagnosis not present

## 2021-08-27 DIAGNOSIS — H353122 Nonexudative age-related macular degeneration, left eye, intermediate dry stage: Secondary | ICD-10-CM | POA: Diagnosis not present

## 2021-08-27 DIAGNOSIS — R441 Visual hallucinations: Secondary | ICD-10-CM

## 2021-08-27 DIAGNOSIS — Z950 Presence of cardiac pacemaker: Secondary | ICD-10-CM | POA: Diagnosis not present

## 2021-08-27 DIAGNOSIS — H35372 Puckering of macula, left eye: Secondary | ICD-10-CM | POA: Diagnosis not present

## 2021-08-27 DIAGNOSIS — H43813 Vitreous degeneration, bilateral: Secondary | ICD-10-CM | POA: Diagnosis not present

## 2021-08-27 DIAGNOSIS — R0781 Pleurodynia: Secondary | ICD-10-CM | POA: Diagnosis not present

## 2021-08-27 LAB — MAGNESIUM: Magnesium: 2.3

## 2021-08-27 NOTE — Telephone Encounter (Signed)
Message routed to [name], MD

## 2021-08-27 NOTE — Progress Notes (Signed)
Location:   [facility] Room Number: 36 Place of Service:  SNF [phone]) Provider:  Veleta Miners MD  [name], MD  Patient Care Team: [name], MD as PCP - General (Internal Medicine) [name], MD as PCP - Electrophysiology (Cardiology) [place], [facility] [name], MD as Consulting Physician (Cardiology) Irine Seal, MD as Attending Physician (Urology) Clance, Armando Reichert, MD as Consulting Physician (Pulmonary Disease) Grace Isaac, MD (Inactive) as Consulting Physician (Cardiothoracic Surgery) Mauri Pole, MD as Consulting Physician (Gastroenterology) Tat, Eustace Quail, DO as Consulting Physician (Neurology)  Extended Emergency Contact Information Primary Emergency Contact: Potocki,Dorothy Address: [address]          [place], [place] [address] Johnnette Litter of [place] Phone: [phone] Mobile Phone: [phone] Relation: Daughter Secondary Emergency Contact: [place], [place] [address] Johnnette Litter of [place] Phone: [phone] Mobile Phone: [phone] Relation: Daughter  Code Status:  DNR Goals of care: Advanced Directive information Advanced Directives 08/27/2021  Does Patient Have a Medical Advance Directive? Yes  Type of Paramedic of [place];Living will;Out of facility DNR (pink MOST or yellow form)  Does patient want to make changes to medical advance directive? No - Patient declined  Copy of [place] in Chart? Yes - validated most recent copy scanned in chart (See row information)  Would patient like information on creating a medical advance directive? -  Pre-existing out of facility DNR order (yellow form or pink MOST form) Pink MOST form placed in chart (order not valid for inpatient use);Yellow form placed in chart (order not valid for inpatient use)     Chief Complaint  Patient presents with   Acute Visit    HPI:  Pt is a 88 y.o. female  seen today for an acute visit for Pain Control Lethargy and Abdominal Pain per her daughter  hallucinations visual, osteoporosis, compression fracture, OSA   Back pain Patient has been having back pain recently was started on Norco but her pain continued  x-ray multiple times have not shown any fracture just showed on degenerative changes Started on Oxycodone But per her daughter on weekend was sleepy and had Nausea Possible Abdominal pain  Patient denies any issues today No Nausea. Bowels moving well Did have some lower back pain and in Lower left chest are No SOB Cough or fever Says her pain is more when she gets up in the morning She is sleeping well Nurses are giving her 3 am dose of Oxycodone per request   Confusion Recent worsening with her move to a new room in SNF.  She also lost her husband few months ago.  Continues to deny any depression.  Mental status at baseline today  History of PMR controlled on prednisone has failed tapering Visual hallucinations Seen by Dr. Carles Collet  she does not think she has Parkinson.  Hallucinations related to prednisone CT scan done recently did not show any acute change  Wt Readings from Last 3 Encounters:  08/27/21 133 lb 11.2 oz (60.6 kg)  07/23/21 136 lb 12.8 oz (62.1 kg)  07/17/21 136 lb 12.8 oz (62.1 kg)    Has lost some more weight. She is eating well    Past Medical History:  Diagnosis Date   Achalasia    Anemia    Arthritis    back   Chronic steroid use    Complete heart block ([place])  S/P PACEMAKER 2000 W/ GENERATOR CHANGE 2009   Empyema, right ([place]) PULMOLOGIST-- DR CLANCE   VATS 06/23/2012 cultures negative to date CXR 07/19/12 persistent airfluid levels/  CXR 11-01-2012 IMPROVE RIGHT PLEURAL EFFUSION   GERD (gastroesophageal reflux disease)    History of aspiration pneumonitis    DEC 2013   History of hiatal hernia    Hypertension    Inguinal hernia    right   Intrinsic urethral sphincter deficiency    Megaesophagus     Mixed stress and urge urinary incontinence    Multinodular thyroid 06/26/2012   Multi nodular goiter. Large nodules in both lobes of the gland.  These nodules fit national criteria for fine needle aspiration  biopsy if not previously assessed.     OSA on CPAP    cpap, doees not know settings   Polymyalgia rheumatica ([place])    on chronic Prednisone 5mg  daily   RBBB    S/P dilatation of esophageal stricture    Past Surgical History:  Procedure Laterality Date   APPENDECTOMY  1953   w/ removal benign kidney tumor    BALLOON DILATION  07/24/2012   Procedure: BALLOON DILATION;  Surgeon: Inda Castle, MD;  Location: [facility];  Service: Endoscopy;  Laterality: N/A;   BALLOON DILATION N/A 12/03/2015   Procedure: BALLOON DILATION;  Surgeon: Mauri Pole, MD;  Location: [facility];  Service: Endoscopy;  Laterality: N/A;  pnuematic balloon   BALLOON DILATION N/A 03/16/2017   Procedure: BALLOON DILATION;  Surgeon: Mauri Pole, MD;  Location: [facility];  Service: Endoscopy;  Laterality: N/A;  PNUEMATIC BALLOONS   BOTOX INJECTION  08/07/2012   Procedure: BOTOX INJECTION;  Surgeon: Inda Castle, MD;  [facility];  Service: Endoscopy;  Laterality: N/A;   BOTOX INJECTION N/A 05/24/2013   Procedure: MACROPLASTIQUE IMPLANT;  Surgeon: Irine Seal, MD;  Location: [facility];  Service: Urology;  Laterality: N/A;   BOTOX INJECTION  02/25/2014   Procedure: BOTOX INJECTION;  Surgeon: Inda Castle, MD;  [facility]: [facility];  Service: Endoscopy;;   CARDIAC PACEMAKER PLACEMENT  06/1999  DR RUTH GREENFIELD AT [facility]  ( LAST PACER CHECK 05-09-2013) for CHB/   END-OF-LIFE GENERATOR CHANGE  2009   CATARACT EXTRACTION W/ INTRAOCULAR LENS  IMPLANT, BILATERAL  2005   DILATION AND CURETTAGE OF UTERUS     ESOPHAGOGASTRODUODENOSCOPY  07/24/2012   Procedure: ESOPHAGOGASTRODUODENOSCOPY (EGD);  Surgeon: Inda Castle, MD;  Location: [facility];  Service:  Endoscopy;  Laterality: N/A;   ESOPHAGOGASTRODUODENOSCOPY  08/07/2012   Procedure: ESOPHAGOGASTRODUODENOSCOPY (EGD);  Surgeon: Inda Castle, MD;  Location: [facility];  Service: Endoscopy;  Laterality: N/A;   ESOPHAGOGASTRODUODENOSCOPY N/A 02/25/2014   Procedure: ESOPHAGOGASTRODUODENOSCOPY (EGD);  Surgeon: Inda Castle, MD;  Location: [facility];  Service: Endoscopy;  Laterality: N/A;   ESOPHAGOGASTRODUODENOSCOPY N/A 03/16/2017   Procedure: ESOPHAGOGASTRODUODENOSCOPY (EGD);  Surgeon: Mauri Pole, MD;  [facility];  Service: Endoscopy;  Laterality: N/A;   ESOPHAGOGASTRODUODENOSCOPY N/A 08/02/2018   Procedure: ESOPHAGOGASTRODUODENOSCOPY (EGD);  Surgeon: Mauri Pole, MD;  Location: [facility];  Service: Endoscopy;  Laterality: N/A;   ESOPHAGOGASTRODUODENOSCOPY (EGD) WITH ESOPHAGEAL DILATION  06/27/2012   Procedure: ESOPHAGOGASTRODUODENOSCOPY (EGD) WITH ESOPHAGEAL DILATION;  Surgeon: Ladene Artist, MD,FACG;  Location: [facility];  Service: Endoscopy;  Laterality: N/A;   ESOPHAGOGASTRODUODENOSCOPY (EGD) WITH PROPOFOL N/A 10/09/2015   Procedure: ESOPHAGOGASTRODUODENOSCOPY (EGD) WITH PROPOFOL ( WITH BOTOX);  Surgeon: Milus Banister, MD;  [facility];  Service: Endoscopy;  Laterality: N/A;   ESOPHAGOGASTRODUODENOSCOPY (EGD) WITH PROPOFOL N/A 12/03/2015   Procedure: ESOPHAGOGASTRODUODENOSCOPY (EGD) WITH PROPOFOL;  Surgeon: Mauri Pole, MD;  Location: [facility];  Service: Endoscopy;  Laterality: N/A;   FOREIGN BODY REMOVAL  08/02/2018   Procedure: FOREIGN BODY REMOVAL;  Surgeon: Mauri Pole, MD;  [facility];  Service: Endoscopy;;   PACEMAKER GENERATOR CHANGE  12/13/2007   at [facility] N/A 06/17/2021   Procedure: PPM GENERATOR CHANGEOUT;  Surgeon: [name], MD;  Location: [facility];  Service: Cardiovascular;  Laterality: N/A;   TOE SURGERY  2013   left 3rd toe HAMMERTOE REPAIR   TONSILLECTOMY   AS CHILD   TOTAL HIP ARTHROPLASTY Right 2001   VERICOSE VEIN LIGATION     VIDEO ASSISTED THORACOSCOPY (VATS)/DECORTICATION  06/23/2012   Procedure: VIDEO ASSISTED THORACOSCOPY (VATS)/DECORTICATION;  Surgeon: Grace Isaac, MD;  Location: [facility];  Service: Thoracic;  Laterality: Right;   VIDEO ASSISTED THORACOSCOPY (VATS)/EMPYEMA     06/23/2012   VIDEO BRONCHOSCOPY  06/23/2012   Procedure: VIDEO BRONCHOSCOPY;  Surgeon: Grace Isaac, MD;  Location: [facility];  Service: Thoracic;  Laterality: N/A;    Allergies  Allergen Reactions   Actonel [Risedronate Sodium] Other (See Comments)    Joint aches; rechallenged --caused joint aches   Ivp Dye [Iodinated Contrast Media] Nausea And Vomiting    Allergies as of 08/27/2021       Reactions   Actonel [risedronate Sodium] Other (See Comments)   Joint aches; rechallenged --caused joint aches   Ivp Dye [iodinated Contrast Media] Nausea And Vomiting        Medication List        Accurate as of August 27, 2021  3:05 PM. If you have any questions, ask your nurse or doctor.          acetaminophen 500 MG tablet Commonly known as: TYLENOL Take 500 mg by mouth every 6 (six) hours as needed.   acetaminophen 325 MG tablet Commonly known as: TYLENOL Take 650 mg by mouth 3 (three) times daily.   Calcium Carbonate 500 MG Chew Chew 1,000 mg by mouth daily.   cholecalciferol 25 MCG (1000 UNIT) tablet Commonly known as: VITAMIN D3 Take 1,000 Units by mouth daily.   [employer] JOINT HEALTH TRIPLE ACTION PO Take 1 tablet by mouth daily.   famotidine 20 MG tablet Commonly known as: PEPCID Take 1 tablet (20 mg total) by mouth daily.   feeding supplement (PRO-STAT SUGAR FREE 64) Liqd Take 30 mLs by mouth 2 (two) times daily.   Iron-Vitamin C 65-125 MG Tabs Take 1 tablet by mouth in the morning.   Lidocaine 4 % Ptch Apply 1 patch topically in the morning and at bedtime.   NON FORMULARY Take 900 mg by mouth daily. Heal and Soothe  capsule; 300 mg; amt: 900 mg; oral Once A Day   OCUVITE EYE + MULTI PO Take 1 tablet by mouth daily.   multivitamin with minerals tablet Take 1 tablet by mouth daily.   oxycodone 5 MG capsule Commonly known as: OXY-IR Take 2.5 mg by mouth 3 (three) times daily.   oxycodone 5 MG capsule Commonly known as: OXY-IR Take one tablet by mouth twice daily at 7am and 9pm; Take 1/2 tablet by mouth three times daily at 1pm,4pm and 3am.   polyethylene glycol 17 g packet Commonly known as: MIRALAX / GLYCOLAX Take 17 g by mouth daily.   potassium  chloride 10 MEQ tablet Commonly known as: KLOR-CON M Take 20 mEq by mouth 3 (three) times daily.   predniSONE 10 MG tablet Commonly known as: DELTASONE Take 10 mg by mouth daily.   torsemide 20 MG tablet Commonly known as: DEMADEX Take 20 mg by mouth daily.   zinc oxide 20 % ointment Apply 1 application topically as needed for irritation. To buttocks after every incontinent episode and as needed for redness. May keep at bedside.        Review of Systems  Constitutional:  Negative for activity change and appetite change.  HENT: Negative.    Respiratory:  Negative for cough and shortness of breath.   Cardiovascular:  Positive for leg swelling.  Gastrointestinal:  Negative for constipation.  Genitourinary: Negative.   Musculoskeletal:  Positive for back pain, gait problem and myalgias. Negative for arthralgias.  Skin: Negative.   Neurological:  Negative for dizziness and weakness.  Psychiatric/Behavioral:  Negative for confusion, dysphoric mood and sleep disturbance.    Immunization History  Administered Date(s) Administered   Influenza Split 09/11/2012, 04/12/2013, 04/30/2015   Influenza, High Dose Seasonal PF 04/18/2017, 04/25/2019   Influenza,inj,Quad PF,6+ Mos 04/13/2018   Influenza-Unspecified 09/19/2012, 05/03/2014, 04/22/2016, 04/20/2017, 04/22/2020, 04/30/2021   Moderna SARS-COV2 Booster Vaccination 12/09/2020   Moderna  Sars-Covid-2 Vaccination 07/16/2019, 08/13/2019, 05/20/2020, 03/31/2021   Pneumococcal Conjugate-13 04/16/2015   Pneumococcal Polysaccharide-23 07/12/2004   Td 01/10/2012   Tdap 11/05/2015   Zoster Recombinat (Shingrix) 02/07/2016   Pertinent  Health Maintenance Due  Topic Date Due   INFLUENZA VACCINE  Completed   DEXA SCAN  Completed   Fall Risk 10/21/2019 10/22/2019 10/23/2019 06/11/2021 06/17/2021  Falls in the past year? - - 0 1 -  Number of falls in past year - - - - -  Was there an injury with Fall? - - - 0 -  Fall Risk Category Calculator - - - 2 -  Fall Risk Category - - - Moderate -  Patient Fall Risk Level High fall risk High fall risk - High fall risk High fall risk   Functional Status Survey:    Vitals:   08/27/21 1452  BP: (!) 141/80  Pulse: (!) 18  Resp: 18  Temp: 97.7 F (36.5 C)  SpO2: 94%  Weight: 133 lb 11.2 oz (60.6 kg)  Height: 5' (1.524 m)   Body mass index is 26.11 kg/m. Physical Exam Vitals reviewed.  Constitutional:      Appearance: Normal appearance.  HENT:     Head: Normocephalic.     Nose: Nose normal.     Mouth/Throat:     Mouth: Mucous membranes are moist.     Pharynx: Oropharynx is clear.  Eyes:     Pupils: Pupils are equal, round, and reactive to light.  Cardiovascular:     Rate and Rhythm: Normal rate and regular rhythm.     Pulses: Normal pulses.     Heart sounds: Murmur heard.  Pulmonary:     Effort: Pulmonary effort is normal.     Breath sounds: Normal breath sounds.     Comments: Has few rales in her Left Lower lobe  Slightly tender in her left lower side of chest Abdominal:     General: Abdomen is flat. Bowel sounds are normal.     Palpations: Abdomen is soft.  Musculoskeletal:        General: Swelling present.     Cervical back: Neck supple.  Skin:    General: Skin is warm.  Neurological:  General: No focal deficit present.     Mental Status: She is alert and oriented to person, place, and time.  Psychiatric:         Mood and Affect: Mood normal.        Thought Content: Thought content normal.    Labs reviewed: Recent Labs    10/30/20 1203 03/03/21 0000 05/11/21 1556 06/17/21 1144 08/12/21 0000  NA 142   < > 142 140 139  K 4.0   < > 4.0 3.3* 4.1  CL 106   < > 99 101 99  CO2 27   < > 29 32 29*  GLUCOSE 127*  --  125* 116*  --   BUN 18   < > 19 20 26*  CREATININE 0.74   < > 0.75 0.67 0.7  CALCIUM 8.8*   < > 9.9 9.5 9.6  MG  --   --   --   --  2.3   < > = values in this interval not displayed.   Recent Labs    03/03/21 0000 03/19/21 0000 08/12/21 0000  AST 13 12* 16  ALT 10 7 14   ALKPHOS 54 70 96  ALBUMIN 3.3* 3.2* 3.5   Recent Labs    10/30/20 1203 03/03/21 0000 03/19/21 0000 05/11/21 1556 06/17/21 1144 08/12/21 0000  WBC 7.8 5.9 4.8 7.0 7.8 6.9  NEUTROABS  --  3,180.00 2,534.00  --   --  4,733.00  HGB 13.6 12.3 11.6* 12.6 13.1 10.8*  HCT 42.9 36 35* 36.3 41.2 32*  MCV 106.5*  --   --  96 105.6*  --   PLT 124* 109* 118* 180 145* 161   Lab Results  Component Value Date   TSH 0.69 08/12/2021   Lab Results  Component Value Date   HGBA1C 5.3 03/03/2021   Lab Results  Component Value Date   CHOL 146 03/03/2021   HDL 51 03/03/2021   LDLCALC 80 03/03/2021   TRIG 71 03/03/2021   CHOLHDL 3.3 02/26/2019    Significant Diagnostic Results in last 30 days:  No results found.  Assessment/Plan  1. Chronic midline low back pain without sciatica Also have this lower Chest pain  Will do Xray to rule out Rib fracture On Scheduled Oxycodone to help her pain  I will reduce the dose of Morning dose to 2.5 mg as it is causing her to have nausea and possible sleepiness  2. Lethargy Changed her dose to 2.5 mg of Oxycodone She looked really good today and was at her baseline  3. Nausea Sometimes in the morning Reducing the dose of Oxycodone and changing the time to later in the morning  4. Visual hallucination H/o Work up per Dr Tat negative  5. Chronic diastolic  congestive heart failure (HCC) On Torsemide  6. Polymyalgia rheumatica (HCC) Chronic Prednisone GDR has failed  7. Achalasia of esophagus On Regular Mechanical soft per her preference Refuses Puree diet   8. Obstructive sleep apnea syndrome CPAP  9. Cardiac pacemaker in situ Follows with Dr Caryl Comes   Family/ staff Communication:   Labs/tests ordered:

## 2021-08-28 ENCOUNTER — Encounter: Payer: Self-pay | Admitting: Internal Medicine

## 2021-08-28 DIAGNOSIS — I5022 Chronic systolic (congestive) heart failure: Secondary | ICD-10-CM | POA: Diagnosis not present

## 2021-08-28 DIAGNOSIS — R2681 Unsteadiness on feet: Secondary | ICD-10-CM | POA: Diagnosis not present

## 2021-08-28 DIAGNOSIS — R2689 Other abnormalities of gait and mobility: Secondary | ICD-10-CM | POA: Diagnosis not present

## 2021-08-28 DIAGNOSIS — M6281 Muscle weakness (generalized): Secondary | ICD-10-CM | POA: Diagnosis not present

## 2021-08-28 DIAGNOSIS — M353 Polymyalgia rheumatica: Secondary | ICD-10-CM | POA: Diagnosis not present

## 2021-08-28 DIAGNOSIS — K220 Achalasia of cardia: Secondary | ICD-10-CM | POA: Diagnosis not present

## 2021-08-29 ENCOUNTER — Encounter: Payer: Self-pay | Admitting: Internal Medicine

## 2021-08-31 ENCOUNTER — Encounter: Payer: Self-pay | Admitting: Internal Medicine

## 2021-08-31 DIAGNOSIS — I5022 Chronic systolic (congestive) heart failure: Secondary | ICD-10-CM | POA: Diagnosis not present

## 2021-08-31 DIAGNOSIS — R2681 Unsteadiness on feet: Secondary | ICD-10-CM | POA: Diagnosis not present

## 2021-08-31 DIAGNOSIS — M353 Polymyalgia rheumatica: Secondary | ICD-10-CM | POA: Diagnosis not present

## 2021-08-31 DIAGNOSIS — K220 Achalasia of cardia: Secondary | ICD-10-CM | POA: Diagnosis not present

## 2021-08-31 DIAGNOSIS — R2689 Other abnormalities of gait and mobility: Secondary | ICD-10-CM | POA: Diagnosis not present

## 2021-08-31 DIAGNOSIS — M6281 Muscle weakness (generalized): Secondary | ICD-10-CM | POA: Diagnosis not present

## 2021-09-01 ENCOUNTER — Non-Acute Institutional Stay (SKILLED_NURSING_FACILITY): Payer: Medicare Other | Admitting: Nurse Practitioner

## 2021-09-01 ENCOUNTER — Encounter: Payer: Self-pay | Admitting: Nurse Practitioner

## 2021-09-01 DIAGNOSIS — K220 Achalasia of cardia: Secondary | ICD-10-CM

## 2021-09-01 DIAGNOSIS — G4733 Obstructive sleep apnea (adult) (pediatric): Secondary | ICD-10-CM | POA: Diagnosis not present

## 2021-09-01 DIAGNOSIS — R2689 Other abnormalities of gait and mobility: Secondary | ICD-10-CM | POA: Diagnosis not present

## 2021-09-01 DIAGNOSIS — M353 Polymyalgia rheumatica: Secondary | ICD-10-CM

## 2021-09-01 DIAGNOSIS — M810 Age-related osteoporosis without current pathological fracture: Secondary | ICD-10-CM

## 2021-09-01 DIAGNOSIS — R110 Nausea: Secondary | ICD-10-CM

## 2021-09-01 DIAGNOSIS — R441 Visual hallucinations: Secondary | ICD-10-CM

## 2021-09-01 DIAGNOSIS — I5032 Chronic diastolic (congestive) heart failure: Secondary | ICD-10-CM | POA: Diagnosis not present

## 2021-09-01 DIAGNOSIS — Z950 Presence of cardiac pacemaker: Secondary | ICD-10-CM | POA: Diagnosis not present

## 2021-09-01 DIAGNOSIS — M6281 Muscle weakness (generalized): Secondary | ICD-10-CM | POA: Diagnosis not present

## 2021-09-01 DIAGNOSIS — D696 Thrombocytopenia, unspecified: Secondary | ICD-10-CM

## 2021-09-01 DIAGNOSIS — M545 Low back pain, unspecified: Secondary | ICD-10-CM | POA: Diagnosis not present

## 2021-09-01 DIAGNOSIS — K5901 Slow transit constipation: Secondary | ICD-10-CM | POA: Diagnosis not present

## 2021-09-01 DIAGNOSIS — E876 Hypokalemia: Secondary | ICD-10-CM

## 2021-09-01 DIAGNOSIS — R2681 Unsteadiness on feet: Secondary | ICD-10-CM | POA: Diagnosis not present

## 2021-09-01 DIAGNOSIS — I5022 Chronic systolic (congestive) heart failure: Secondary | ICD-10-CM | POA: Diagnosis not present

## 2021-09-01 DIAGNOSIS — G8929 Other chronic pain: Secondary | ICD-10-CM

## 2021-09-01 NOTE — Assessment & Plan Note (Signed)
No BM 3-4 days until today, small BM per patient, continue MiraLax qod, spoke with HPOA Mariette: adding Senokot I qhs, monitor for loose stools and fecal incontinence. Update CBC/diff CMP/eGFR
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
## 2021-09-02 ENCOUNTER — Encounter: Payer: Self-pay | Admitting: Internal Medicine

## 2021-09-02 DIAGNOSIS — R2689 Other abnormalities of gait and mobility: Secondary | ICD-10-CM | POA: Diagnosis not present

## 2021-09-02 DIAGNOSIS — M353 Polymyalgia rheumatica: Secondary | ICD-10-CM | POA: Diagnosis not present

## 2021-09-02 DIAGNOSIS — I5022 Chronic systolic (congestive) heart failure: Secondary | ICD-10-CM | POA: Diagnosis not present

## 2021-09-02 DIAGNOSIS — K220 Achalasia of cardia: Secondary | ICD-10-CM | POA: Diagnosis not present

## 2021-09-02 DIAGNOSIS — R2681 Unsteadiness on feet: Secondary | ICD-10-CM | POA: Diagnosis not present

## 2021-09-02 DIAGNOSIS — K31819 Angiodysplasia of stomach and duodenum without bleeding: Secondary | ICD-10-CM | POA: Diagnosis not present

## 2021-09-02 DIAGNOSIS — M6281 Muscle weakness (generalized): Secondary | ICD-10-CM | POA: Diagnosis not present

## 2021-09-02 DIAGNOSIS — E876 Hypokalemia: Secondary | ICD-10-CM | POA: Diagnosis not present

## 2021-09-02 LAB — CBC AND DIFFERENTIAL
HCT: 30 — AB (ref 36–46)
Hemoglobin: 9.9 — AB (ref 12.0–16.0)
Neutrophils Absolute: 11327
Platelets: 199 (ref 150–399)
WBC: 13.5

## 2021-09-02 LAB — CBC: RBC: 2.89 — AB (ref 3.87–5.11)

## 2021-09-03 ENCOUNTER — Non-Acute Institutional Stay (SKILLED_NURSING_FACILITY): Payer: Medicare Other | Admitting: Internal Medicine

## 2021-09-03 ENCOUNTER — Encounter: Payer: Self-pay | Admitting: Internal Medicine

## 2021-09-03 DIAGNOSIS — R2681 Unsteadiness on feet: Secondary | ICD-10-CM | POA: Diagnosis not present

## 2021-09-03 DIAGNOSIS — M6281 Muscle weakness (generalized): Secondary | ICD-10-CM | POA: Diagnosis not present

## 2021-09-03 DIAGNOSIS — R109 Unspecified abdominal pain: Secondary | ICD-10-CM | POA: Diagnosis not present

## 2021-09-03 DIAGNOSIS — R400 Somnolence: Secondary | ICD-10-CM | POA: Diagnosis not present

## 2021-09-03 DIAGNOSIS — R110 Nausea: Secondary | ICD-10-CM | POA: Diagnosis not present

## 2021-09-03 DIAGNOSIS — R140 Abdominal distension (gaseous): Secondary | ICD-10-CM | POA: Diagnosis not present

## 2021-09-03 DIAGNOSIS — D72825 Bandemia: Secondary | ICD-10-CM | POA: Diagnosis not present

## 2021-09-03 DIAGNOSIS — I5022 Chronic systolic (congestive) heart failure: Secondary | ICD-10-CM | POA: Diagnosis not present

## 2021-09-03 DIAGNOSIS — J189 Pneumonia, unspecified organism: Secondary | ICD-10-CM | POA: Diagnosis not present

## 2021-09-03 DIAGNOSIS — M353 Polymyalgia rheumatica: Secondary | ICD-10-CM | POA: Diagnosis not present

## 2021-09-03 DIAGNOSIS — R1319 Other dysphagia: Secondary | ICD-10-CM | POA: Diagnosis not present

## 2021-09-03 DIAGNOSIS — K220 Achalasia of cardia: Secondary | ICD-10-CM | POA: Diagnosis not present

## 2021-09-03 DIAGNOSIS — R2689 Other abnormalities of gait and mobility: Secondary | ICD-10-CM | POA: Diagnosis not present

## 2021-09-03 NOTE — Assessment & Plan Note (Signed)
CPAP/O2 at night, underwent sleep study.

## 2021-09-03 NOTE — Assessment & Plan Note (Signed)
ICD implant 06/17/21, left upper chest incision intact under clear dressing.

## 2021-09-03 NOTE — Assessment & Plan Note (Signed)
Dysphagia diet/achalasia of esophagus, s/p dilation, on Pepcid, Pureed diet

## 2021-09-03 NOTE — Assessment & Plan Note (Signed)
on Kcl, K 4.1 08/12/21

## 2021-09-03 NOTE — Progress Notes (Signed)
Location:   [facility] [place] Room Number: [id number] Place of Service:  SNF [phone]) Provider:  Veleta Miners MD  [name], MD  Patient Care Team: [name], MD as PCP - General (Internal Medicine) [name], MD as PCP - Electrophysiology (Cardiology) [place] [name], MD as Consulting Physician (Cardiology) Irine Seal, MD as Attending Physician (Urology) Clance, Armando Reichert, MD as Consulting Physician (Pulmonary Disease) Grace Isaac, MD (Inactive) as Consulting Physician (Cardiothoracic Surgery) Mauri Pole, MD as Consulting Physician (Gastroenterology) Tat, Eustace Quail, DO as Consulting Physician (Neurology)  Extended Emergency Contact Information Primary Emergency Contact: Carillo,Dorothy Address: [address]          [place], [place] [address] Johnnette Litter of [place] Phone: [phone] Mobile Phone: [phone] Relation: Daughter Secondary Emergency Contact: [place], [place] [address] Johnnette Litter of [place] Phone: [phone] Mobile Phone: [phone] Relation: Daughter  Code Status:  DNR Goals of care: Advanced Directive information Advanced Directives 08/27/2021  Does Patient Have a Medical Advance Directive? Yes  Type of Paramedic of [place];Living will;Out of facility DNR (pink MOST or yellow form)  Does patient want to make changes to medical advance directive? No - Patient declined  Copy of [place] in Chart? Yes - validated most recent copy scanned in chart (See row information)  Would patient like information on creating a medical advance directive? -  Pre-existing out of facility DNR order (yellow form or pink MOST form) Pink MOST form placed in chart (order not valid for inpatient use);Yellow form placed in chart (order not valid for inpatient use)     Chief Complaint  Patient presents with   Acute Visit    HPI:  Pt is a 88 y.o. female  seen today for an acute visit for Sleeping more, Nausea and Elevated WBC  Patient has a history of PMR, esophageal dysphagia, diastolic CHF, s/p pacemaker, anemia, hallucinations visual, osteoporosis, compression fracture, OSA   Patient was doing well last week but this week she has been more sleepy and tired. Refusing to eat. Refusing to take her Meds  Says she is Nauseated. Feels that food gets Stuck in her Chest She has h/o Esophageal Dysphagia EGD from 2020 esophagus was grossly tortuous and the lumen of the esophagus was severely dilated. Food was found predominantly in the proximal esophagus diverticula  She has been on Mechanical Soft diet per her request as she does not like Puree Diet No Cough or Chest Pain or SOB  Pain seems controlled   White count 13 with Left shift BUN 37 creat 0.92 Hgb 9.9   Past Medical History:  Diagnosis Date   Achalasia    Anemia    Arthritis    back   Chronic steroid use    Complete heart block ([place])    S/P PACEMAKER 2000 W/ GENERATOR CHANGE 2009   Empyema, right ([place]) PULMOLOGIST-- DR CLANCE   VATS 06/23/2012 cultures negative to date CXR 07/19/12 persistent airfluid levels/  CXR 11-01-2012 IMPROVE RIGHT PLEURAL EFFUSION   GERD (gastroesophageal reflux disease)    History of aspiration pneumonitis    DEC 2013   History of hiatal hernia    Hypertension    Inguinal hernia    right   Intrinsic urethral sphincter deficiency    Megaesophagus    Mixed stress and urge urinary incontinence    Multinodular thyroid 06/26/2012  Multi nodular goiter. Large nodules in both lobes of the gland.  These nodules fit national criteria for fine needle aspiration  biopsy if not previously assessed.     OSA on CPAP    cpap, doees not know settings   Polymyalgia rheumatica ([place])    on chronic Prednisone 5mg  daily   RBBB    S/P dilatation of esophageal stricture    Past Surgical History:  Procedure Laterality Date   APPENDECTOMY  1953   w/ removal  benign kidney tumor    BALLOON DILATION  07/24/2012   Procedure: BALLOON DILATION;  Surgeon: Inda Castle, MD;  Location: [facility];  Service: Endoscopy;  Laterality: N/A;   BALLOON DILATION N/A 12/03/2015   Procedure: BALLOON DILATION;  Surgeon: Mauri Pole, MD;  Location: [facility];  Service: Endoscopy;  Laterality: N/A;  pnuematic balloon   BALLOON DILATION N/A 03/16/2017   Procedure: BALLOON DILATION;  Surgeon: Mauri Pole, MD;  Location: [facility];  Service: Endoscopy;  Laterality: N/A;  PNUEMATIC BALLOONS   BOTOX INJECTION  08/07/2012   Procedure: BOTOX INJECTION;  Surgeon: Inda Castle, MD;  [facility];  Service: Endoscopy;  Laterality: N/A;   BOTOX INJECTION N/A 05/24/2013   Procedure: MACROPLASTIQUE IMPLANT;  Surgeon: Irine Seal, MD;  Location: [facility];  Service: Urology;  Laterality: N/A;   BOTOX INJECTION  02/25/2014   Procedure: BOTOX INJECTION;  Surgeon: Inda Castle, MD;  [facility];  Service: Endoscopy;;   CARDIAC PACEMAKER PLACEMENT  06/1999  DR RUTH GREENFIELD AT [facility]  ( LAST PACER CHECK 05-09-2013) for CHB/   END-OF-LIFE GENERATOR CHANGE  2009   CATARACT EXTRACTION W/ INTRAOCULAR LENS  IMPLANT, BILATERAL  2005   DILATION AND CURETTAGE OF UTERUS     ESOPHAGOGASTRODUODENOSCOPY  07/24/2012   Procedure: ESOPHAGOGASTRODUODENOSCOPY (EGD);  Surgeon: Inda Castle, MD;  Location: [facility];  Service: Endoscopy;  Laterality: N/A;   ESOPHAGOGASTRODUODENOSCOPY  08/07/2012   Procedure: ESOPHAGOGASTRODUODENOSCOPY (EGD);  Surgeon: Inda Castle, MD;  Location: [facility];  Service: Endoscopy;  Laterality: N/A;   ESOPHAGOGASTRODUODENOSCOPY N/A 02/25/2014   Procedure: ESOPHAGOGASTRODUODENOSCOPY (EGD);  Surgeon: Inda Castle, MD;  Location: [facility];  Service: Endoscopy;  Laterality: N/A;   ESOPHAGOGASTRODUODENOSCOPY N/A 03/16/2017   Procedure: ESOPHAGOGASTRODUODENOSCOPY (EGD);  Surgeon: Mauri Pole, MD;  Location: [facility];  Service: Endoscopy;  Laterality: N/A;   ESOPHAGOGASTRODUODENOSCOPY N/A 08/02/2018   Procedure: ESOPHAGOGASTRODUODENOSCOPY (EGD);  Surgeon: Mauri Pole, MD;  Location: [facility];  Service: Endoscopy;  Laterality: N/A;   ESOPHAGOGASTRODUODENOSCOPY (EGD) WITH ESOPHAGEAL DILATION  06/27/2012   Procedure: ESOPHAGOGASTRODUODENOSCOPY (EGD) WITH ESOPHAGEAL DILATION;  Surgeon: Ladene Artist, MD,FACG;  Location: [facility];  Service: Endoscopy;  Laterality: N/A;   ESOPHAGOGASTRODUODENOSCOPY (EGD) WITH PROPOFOL N/A 10/09/2015   Procedure: ESOPHAGOGASTRODUODENOSCOPY (EGD) WITH PROPOFOL ( WITH BOTOX);  Surgeon: Milus Banister, MD;  Location: [facility];  Service: Endoscopy;  Laterality: N/A;   ESOPHAGOGASTRODUODENOSCOPY (EGD) WITH PROPOFOL N/A 12/03/2015   Procedure: ESOPHAGOGASTRODUODENOSCOPY (EGD) WITH PROPOFOL;  Surgeon: Mauri Pole, MD;  Location: [facility];  Service: Endoscopy;  Laterality: N/A;   FOREIGN BODY REMOVAL  08/02/2018   Procedure: FOREIGN BODY REMOVAL;  Surgeon: Mauri Pole, MD;  [facility];  Service: Endoscopy;;   PACEMAKER GENERATOR CHANGE  12/13/2007   at [place] N/A 06/17/2021   Procedure: PPM GENERATOR CHANGEOUT;  Surgeon: [name], MD;  Location: [facility];  Service: Cardiovascular;  Laterality: N/A;   TOE SURGERY  2013   left 3rd toe HAMMERTOE REPAIR   TONSILLECTOMY  AS CHILD   TOTAL HIP ARTHROPLASTY Right 2001   VERICOSE VEIN LIGATION     VIDEO ASSISTED THORACOSCOPY (VATS)/DECORTICATION  06/23/2012   Procedure: VIDEO ASSISTED THORACOSCOPY (VATS)/DECORTICATION;  Surgeon: Grace Isaac, MD;  Location: [facility];  Service: Thoracic;  Laterality: Right;   VIDEO ASSISTED THORACOSCOPY (VATS)/EMPYEMA     06/23/2012   VIDEO BRONCHOSCOPY  06/23/2012   Procedure: VIDEO BRONCHOSCOPY;  Surgeon: Grace Isaac, MD;  Location: [facility];  Service: Thoracic;  Laterality: N/A;     Allergies  Allergen Reactions   Actonel [Risedronate Sodium] Other (See Comments)    Joint aches; rechallenged --caused joint aches   Ivp Dye [Iodinated Contrast Media] Nausea And Vomiting    Allergies as of 09/03/2021       Reactions   Actonel [risedronate Sodium] Other (See Comments)   Joint aches; rechallenged --caused joint aches   Ivp Dye [iodinated Contrast Media] Nausea And Vomiting        Medication List        Accurate as of September 03, 2021 10:53 AM. If you have any questions, ask your nurse or doctor.          acetaminophen 500 MG tablet Commonly known as: TYLENOL Take 500 mg by mouth every 6 (six) hours as needed.   acetaminophen 325 MG tablet Commonly known as: TYLENOL Take 650 mg by mouth 3 (three) times daily.   Calcium Carbonate 500 MG Chew Chew 1,000 mg by mouth daily.   cholecalciferol 25 MCG (1000 UNIT) tablet Commonly known as: VITAMIN D3 Take 1,000 Units by mouth daily.   [employer] JOINT HEALTH TRIPLE ACTION PO Take 1 tablet by mouth daily.   famotidine 20 MG tablet Commonly known as: PEPCID Take 1 tablet (20 mg total) by mouth daily.   feeding supplement (PRO-STAT SUGAR FREE 64) Liqd Take 30 mLs by mouth 2 (two) times daily.   Iron-Vitamin C 65-125 MG Tabs Take 1 tablet by mouth in the morning.   Lidocaine 4 % Ptch Apply 1 patch topically in the morning and at bedtime.   NON FORMULARY Take 900 mg by mouth daily. Heal and Soothe capsule; 300 mg; amt: 900 mg; oral Once A Day   OCUVITE EYE + MULTI PO Take 1 tablet by mouth daily.   multivitamin with minerals tablet Take 1 tablet by mouth daily.   oxycodone 5 MG capsule Commonly known as: OXY-IR Take 2.5 mg by mouth 4 (four) times daily.   oxycodone 5 MG capsule Commonly known as: OXY-IR Take one tablet by mouth twice daily at 7am and 9pm; Take 1/2 tablet by mouth three times daily at 1pm,4pm and 3am.   polyethylene glycol 17 g packet Commonly known as: MIRALAX /  GLYCOLAX Take 17 g by mouth daily.   potassium chloride 10 MEQ tablet Commonly known as: KLOR-CON M Take 20 mEq by mouth 3 (three) times daily.   predniSONE 10 MG tablet Commonly known as: DELTASONE Take 10 mg by mouth daily.   senna 8.6 MG tablet Commonly known as: SENOKOT Take 1 tablet by mouth daily.   torsemide 20 MG tablet Commonly known as: DEMADEX Take 20 mg by mouth daily.   zinc oxide 20 % ointment Apply 1 application topically as needed for irritation. To buttocks after every incontinent episode and as needed for redness. May keep at bedside.  Review of Systems  Constitutional:  Positive for activity change and appetite change.  HENT: Negative.    Respiratory:  Negative for cough and shortness of breath.   Cardiovascular:  Negative for leg swelling.  Gastrointestinal:  Positive for abdominal pain and nausea. Negative for constipation.  Genitourinary: Negative.   Musculoskeletal:  Positive for gait problem. Negative for arthralgias and myalgias.  Skin: Negative.   Neurological:  Positive for weakness. Negative for dizziness.  Psychiatric/Behavioral:  Positive for sleep disturbance. Negative for confusion and dysphoric mood.    Immunization History  Administered Date(s) Administered   Influenza Split 09/11/2012, 04/12/2013, 04/30/2015   Influenza, High Dose Seasonal PF 04/18/2017, 04/25/2019   Influenza,inj,Quad PF,6+ Mos 04/13/2018   Influenza-Unspecified 09/19/2012, 05/03/2014, 04/22/2016, 04/20/2017, 04/22/2020, 04/30/2021   Moderna SARS-COV2 Booster Vaccination 12/09/2020   Moderna Sars-Covid-2 Vaccination 07/16/2019, 08/13/2019, 05/20/2020, 03/31/2021   Pneumococcal Conjugate-13 04/16/2015   Pneumococcal Polysaccharide-23 07/12/2004   Td 01/10/2012   Tdap 11/05/2015   Zoster Recombinat (Shingrix) 02/07/2016   Pertinent  Health Maintenance Due  Topic Date Due   INFLUENZA VACCINE  Completed   DEXA SCAN  Completed   Fall Risk 10/21/2019  10/22/2019 10/23/2019 06/11/2021 06/17/2021  Falls in the past year? - - 0 1 -  Number of falls in past year - - - - -  Was there an injury with Fall? - - - 0 -  Fall Risk Category Calculator - - - 2 -  Fall Risk Category - - - Moderate -  Patient Fall Risk Level High fall risk High fall risk - High fall risk High fall risk   Functional Status Survey:    Vitals:   09/03/21 1047  BP: 114/73  Pulse: 100  Resp: 20  Temp: 97.8 F (36.6 C)  SpO2: 96%  Weight: 133 lb 11.2 oz (60.6 kg)  Height: 5' (1.524 m)   Body mass index is 26.11 kg/m. Physical Exam Vitals reviewed.  Constitutional:      Comments: But will fall asleep when talking to me  HENT:     Head: Normocephalic.     Nose: Nose normal.     Mouth/Throat:     Mouth: Mucous membranes are moist.     Pharynx: Oropharynx is clear.  Eyes:     Pupils: Pupils are equal, round, and reactive to light.  Cardiovascular:     Rate and Rhythm: Normal rate and regular rhythm.     Pulses: Normal pulses.     Heart sounds: Murmur heard.  Pulmonary:     Effort: Pulmonary effort is normal.     Breath sounds: Normal breath sounds.  Abdominal:     General: Abdomen is flat. Bowel sounds are normal.     Palpations: Abdomen is soft.     Tenderness: There is no abdominal tenderness.  Musculoskeletal:        General: Swelling present.     Cervical back: Neck supple.  Skin:    General: Skin is warm.  Neurological:     General: No focal deficit present.     Mental Status: She is alert.     Comments: Looks More Sleepy today. Also more confused Was unable to keep up with the Conversations  Psychiatric:        Mood and Affect: Mood normal.        Thought Content: Thought content normal.    Labs reviewed: Recent Labs    10/30/20 1203 03/03/21 0000 05/11/21 1556 06/17/21 1144 08/12/21 0000  NA 142   < >  142 140 139  K 4.0   < > 4.0 3.3* 4.1  CL 106   < > 99 101 99  CO2 27   < > 29 32 29*  GLUCOSE 127*  --  125* 116*  --   BUN 18    < > 19 20 26*  CREATININE 0.74   < > 0.75 0.67 0.7  CALCIUM 8.8*   < > 9.9 9.5 9.6  MG  --   --   --   --  2.3   < > = values in this interval not displayed.   Recent Labs    03/03/21 0000 03/19/21 0000 08/12/21 0000  AST 13 12* 16  ALT 10 7 14   ALKPHOS 54 70 96  ALBUMIN 3.3* 3.2* 3.5   Recent Labs    10/30/20 1203 03/03/21 0000 03/19/21 0000 05/11/21 1556 06/17/21 1144 08/12/21 0000  WBC 7.8 5.9 4.8 7.0 7.8 6.9  NEUTROABS  --  3,180.00 2,534.00  --   --  4,733.00  HGB 13.6 12.3 11.6* 12.6 13.1 10.8*  HCT 42.9 36 35* 36.3 41.2 32*  MCV 106.5*  --   --  96 105.6*  --   PLT 124* 109* 118* 180 145* 161   Lab Results  Component Value Date   TSH 0.69 08/12/2021   Lab Results  Component Value Date   HGBA1C 5.3 03/03/2021   Lab Results  Component Value Date   CHOL 146 03/03/2021   HDL 51 03/03/2021   LDLCALC 80 03/03/2021   TRIG 71 03/03/2021   CHOLHDL 3.3 02/26/2019    Significant Diagnostic Results in last 30 days:  No results found.  Assessment/Plan  Esophageal dysphagia Per patient she cant eat as she feels Food Stuck in her Chest Discussed with Daughter her POA She does nto think goiung to GI is option as she would not tolerate another EGD Had refused Puree Diet so was on Mechanical Soft At this time will change her Diet to Full Liquid Use Zofran 4 mg PRN Also will Hold some of her Vitamis and Supplements  Nausea due to Above Zofran Also Abdominal Xray to rule out Ileus Bandemia Will check UA  and Chest Xray Sleepiness ? Rule out infection with increased White Count  Goals of care Daughter going to discuss with family if patient does not change course about hospice  Addendum Chest Xray positive for Bilateral Pneumonia Most likley Aspiration Started on Doxycyline BID for 7 days Abdominal Xray Constipation with moderate amoutn of stools With mild ileus Started on Dulcolax supppository Already on Miralax and senna   Decreased the dose of  Oxycodone  Other issues Visual hallucination H/o Work up per Dr Tat negative   Chronic diastolic congestive heart failure (HCC) On Torsemide    Polymyalgia rheumatica (HCC) Chronic Prednisone GDR has failed    Obstructive sleep apnea syndrome CPAP   Cardiac pacemaker in situ Follows with Dr Caryl Comes      Family/ staff Communication:   Labs/tests ordered:

## 2021-09-03 NOTE — Assessment & Plan Note (Signed)
Reduced Oxycodone was not effective, recommended Zofran prn-HPOA will make decision. Declined Reglan or change to PPI from Famotidine. Will update CBC/diff, CMP/eGFR

## 2021-09-03 NOTE — Assessment & Plan Note (Signed)
plt 161 08/12/21

## 2021-09-03 NOTE — Assessment & Plan Note (Signed)
Hx of PMR saw rheumatology, on Prednisone, failed GDR. ESR 41. CRP 22.7 03/18/21
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
## 2021-09-03 NOTE — Assessment & Plan Note (Signed)
takes Prolia. T score -2.9 2018

## 2021-09-03 NOTE — Assessment & Plan Note (Addendum)
Positional back pain mid/lower spine and pain in lower chest wall. On Oxycodone, reduced am dose 08/27/21 2/2 to nausea, sleepiness. X-ray 2/16/23no acute cardiopulmonary disease or rib fx. l/movement associated, Oxycodone for pain.

## 2021-09-03 NOTE — Assessment & Plan Note (Signed)
minimal edema BLE, BNP 122 in the past, echocardiogram  EF 61-68%, grade I diastolic dysfunction.  CXR bilateral effusions, on Torsemide. F/u cardiology. Bun/creat 26/0.7 08/12/21

## 2021-09-04 ENCOUNTER — Telehealth: Payer: Self-pay | Admitting: *Deleted

## 2021-09-04 ENCOUNTER — Encounter: Payer: Self-pay | Admitting: Orthopedic Surgery

## 2021-09-04 ENCOUNTER — Non-Acute Institutional Stay (SKILLED_NURSING_FACILITY): Payer: Medicare Other | Admitting: Orthopedic Surgery

## 2021-09-04 DIAGNOSIS — H6123 Impacted cerumen, bilateral: Secondary | ICD-10-CM

## 2021-09-04 DIAGNOSIS — J189 Pneumonia, unspecified organism: Secondary | ICD-10-CM | POA: Diagnosis not present

## 2021-09-04 DIAGNOSIS — K5901 Slow transit constipation: Secondary | ICD-10-CM | POA: Diagnosis not present

## 2021-09-04 DIAGNOSIS — R400 Somnolence: Secondary | ICD-10-CM | POA: Diagnosis not present

## 2021-09-04 DIAGNOSIS — Z66 Do not resuscitate: Secondary | ICD-10-CM | POA: Diagnosis not present

## 2021-09-04 DIAGNOSIS — F4321 Adjustment disorder with depressed mood: Secondary | ICD-10-CM | POA: Diagnosis not present

## 2021-09-07 ENCOUNTER — Telehealth: Payer: Self-pay | Admitting: Internal Medicine

## 2021-09-07 NOTE — Telephone Encounter (Signed)
[facility]link and paceart updating,  Forwarding to Dr. Caryl Comes as an Juluis Rainier.  LOV was 07/01/21 for wound check post gen changeout with AT.

## 2021-09-07 NOTE — Telephone Encounter (Signed)
° °  Pt's son called, he said pt passed away last 10-11-2022 and her body is being donated at [facility], he doesn't know what they're going to do with device, he said he is not involved with all that. Sending this phone note to cancel scheduled transmission

## 2021-09-08 ENCOUNTER — Telehealth: Payer: Self-pay | Admitting: Neurology

## 2021-09-08 NOTE — Telephone Encounter (Signed)
Patient Deceased Sep 25, 2021.

## 2021-09-08 NOTE — Telephone Encounter (Signed)
Pt's daughter called in to let us know the patient passed away this past 02-Oct-2022 2021/09/05.

## 2021-09-09 NOTE — Telephone Encounter (Signed)
Patient daughter, Magdalen Spatz called and left message on Clinical intake last night stating that she does not know when patient's Furosemide was changed to Torsemide. Stated that Dr. Lyndel Safe saw patient 2/23.   Daughter is wondering if patient is having a reaction to the Torsemide. Daughter stated that she spoke with Dr. Lyndel Safe earlier and this was a question that came up.

## 2021-09-09 NOTE — Progress Notes (Signed)
Location:  [facility] Room Number: N36/A Place of Service:  SNF [phone]) Provider: Yvonna Alanis, NP   Patient Care Team: [name], MD as PCP - General (Internal Medicine) [name], MD as PCP - Electrophysiology (Cardiology) [place], [facility] [name], MD as Consulting Physician (Cardiology) Irine Seal, MD as Attending Physician (Urology) Clance, Armando Reichert, MD as Consulting Physician (Pulmonary Disease) Grace Isaac, MD (Inactive) as Consulting Physician (Cardiothoracic Surgery) Mauri Pole, MD as Consulting Physician (Gastroenterology) Tat, Eustace Quail, DO as Consulting Physician (Neurology)  Extended Emergency Contact Information Primary Emergency Contact: Gaber,Dorothy Address: [address]          [place], [place] [address] Johnnette Litter of [place] Phone: [phone] Mobile Phone: [phone] Relation: Daughter Secondary Emergency Contact: [place], [place] [address] Johnnette Litter of [place] Phone: [phone] Mobile Phone: [phone] Relation: Daughter  Code Status:  DNR Goals of care: Advanced Directive information Advanced Directives 09/22/21  Does Patient Have a Medical Advance Directive? Yes  Type of Paramedic of [place];Living will;Out of facility DNR (pink MOST or yellow form)  Does patient want to make changes to medical advance directive? No - Patient declined  Copy of Brenda in Chart? Yes - validated most recent copy scanned in chart (See row information)  Would patient like information on creating a medical advance directive? -  Pre-existing out of facility DNR order (yellow form or pink MOST form) Pink MOST form placed in chart (order not valid for inpatient use);Yellow form placed in chart (order not valid for inpatient use)     Chief Complaint  Patient presents with   Medical Management of Chronic Issues    Routine visit. Discuss the  need for Shingrix vaccine and additional COVID booster, or post pone if patient refuses.    HPI:  Pt is a 88 y.o. female seen today for an acute visit for abdominal pain.   CXR 02/23 revealed bilateral pneumonia. Abdominal xray 02/23 noted increased fecal matter in distal colon. She was started on doxycycline, florastor and dulcolax suppository. Remains on senna and miralax. UA/culture pending. She has been on oxycodone due to chronic pain. Medications discussed with daughter this morning. Plan to reduce oxycodone for constipation prevention.   Previous NP questioned hearing loss. Daughter would like ears examined for wax.     Past Medical History:  Diagnosis Date   Achalasia    Anemia    Arthritis    back   Chronic steroid use    Complete heart block ([place])    S/P PACEMAKER 2000 W/ GENERATOR CHANGE 2009   Empyema, right ([place]) PULMOLOGIST-- DR CLANCE   VATS 06/23/2012 cultures negative to date CXR 07/19/12 persistent airfluid levels/  CXR 11-01-2012 IMPROVE RIGHT PLEURAL EFFUSION   GERD (gastroesophageal reflux disease)    History of aspiration pneumonitis    DEC 2013   History of hiatal hernia    Hypertension    Inguinal hernia    right   Intrinsic urethral sphincter deficiency    Megaesophagus    Mixed stress and urge urinary incontinence    Multinodular thyroid 06/26/2012   Multi nodular goiter. Large nodules in both lobes of the gland.  These nodules fit national criteria for fine needle aspiration  biopsy if not previously assessed.     OSA on CPAP    cpap, doees not know settings   Polymyalgia rheumatica (  [place])    on chronic Prednisone 5mg  daily   RBBB    S/P dilatation of esophageal stricture    Past Surgical History:  Procedure Laterality Date   APPENDECTOMY  1953   w/ removal benign kidney tumor    BALLOON DILATION  07/24/2012   Procedure: BALLOON DILATION;  Surgeon: Inda Castle, MD;  Location: [facility];  Service: Endoscopy;  Laterality: N/A;   BALLOON  DILATION N/A 12/03/2015   Procedure: BALLOON DILATION;  Surgeon: Mauri Pole, MD;  Location: [facility];  Service: Endoscopy;  Laterality: N/A;  pnuematic balloon   BALLOON DILATION N/A 03/16/2017   Procedure: BALLOON DILATION;  Surgeon: Mauri Pole, MD;  Location: [facility];  Service: Endoscopy;  Laterality: N/A;  PNUEMATIC BALLOONS   BOTOX INJECTION  08/07/2012   Procedure: BOTOX INJECTION;  Surgeon: Inda Castle, MD;  Location: [facility];  Service: Endoscopy;  Laterality: N/A;   BOTOX INJECTION N/A 05/24/2013   Procedure: MACROPLASTIQUE IMPLANT;  Surgeon: Irine Seal, MD;  Location[facility];  Service: Urology;  Laterality: N/A;   BOTOX INJECTION  02/25/2014   Procedure: BOTOX INJECTION;  Surgeon: Inda Castle, MD;  Location: [facility];  Service: Endoscopy;;   CARDIAC PACEMAKER PLACEMENT  06/1999  DR RUTH [place] AT [facility]  ( LAST PACER CHECK 05-09-2013) for CHB/   END-OF-LIFE GENERATOR CHANGE  2009   CATARACT EXTRACTION W/ INTRAOCULAR LENS  IMPLANT, BILATERAL  2005   DILATION AND CURETTAGE OF UTERUS     ESOPHAGOGASTRODUODENOSCOPY  07/24/2012   Procedure: ESOPHAGOGASTRODUODENOSCOPY (EGD);  Surgeon: Inda Castle, MD;  Location: [facility];  Service: Endoscopy;  Laterality: N/A;   ESOPHAGOGASTRODUODENOSCOPY  08/07/2012   Procedure: ESOPHAGOGASTRODUODENOSCOPY (EGD);  Surgeon: Inda Castle, MD;  Location: [facility];  Service: Endoscopy;  Laterality: N/A;   ESOPHAGOGASTRODUODENOSCOPY N/A 02/25/2014   Procedure: ESOPHAGOGASTRODUODENOSCOPY (EGD);  Surgeon: Inda Castle, MD;  Location: [facility];  Service: Endoscopy;  Laterality: N/A;   ESOPHAGOGASTRODUODENOSCOPY N/A 03/16/2017   Procedure: ESOPHAGOGASTRODUODENOSCOPY (EGD);  Surgeon: Mauri Pole, MD;  Location: [facility] ENDOSCOPY;  Service: Endoscopy;  Laterality: N/A;   ESOPHAGOGASTRODUODENOSCOPY N/A 08/02/2018   Procedure: ESOPHAGOGASTRODUODENOSCOPY (EGD);  Surgeon: Mauri Pole, MD;  Location: [facility];  Service: Endoscopy;  Laterality: N/A;   ESOPHAGOGASTRODUODENOSCOPY (EGD) WITH ESOPHAGEAL DILATION  06/27/2012   Procedure: ESOPHAGOGASTRODUODENOSCOPY (EGD) WITH ESOPHAGEAL DILATION;  Surgeon: Ladene Artist, MD,FACG;  Location: [facility];  Service: Endoscopy;  Laterality: N/A;   ESOPHAGOGASTRODUODENOSCOPY (EGD) WITH PROPOFOL N/A 10/09/2015   Procedure: ESOPHAGOGASTRODUODENOSCOPY (EGD) WITH PROPOFOL ( WITH BOTOX);  Surgeon: Milus Banister, MD;  Location: [facility];  Service: Endoscopy;  Laterality: N/A;   ESOPHAGOGASTRODUODENOSCOPY (EGD) WITH PROPOFOL N/A 12/03/2015   Procedure: ESOPHAGOGASTRODUODENOSCOPY (EGD) WITH PROPOFOL;  Surgeon: Mauri Pole, MD;  Location: [facility];  Service: Endoscopy;  Laterality: N/A;   FOREIGN BODY REMOVAL  08/02/2018   Procedure: FOREIGN BODY REMOVAL;  Surgeon: Mauri Pole, MD;  Location: [facility];  Service: Endoscopy;;   PACEMAKER GENERATOR CHANGE  12/13/2007   at [facility] N/A 06/17/2021   Procedure: PPM GENERATOR CHANGEOUT;  Surgeon: [name], MD;  Location: [facility];  Service: Cardiovascular;  Laterality: N/A;   TOE SURGERY  2013   left 3rd toe HAMMERTOE REPAIR   TONSILLECTOMY  AS CHILD   TOTAL HIP ARTHROPLASTY Right 2001   VERICOSE VEIN LIGATION     VIDEO ASSISTED THORACOSCOPY (VATS)/DECORTICATION  06/23/2012   Procedure: VIDEO ASSISTED THORACOSCOPY (VATS)/DECORTICATION;  Surgeon: Grace Isaac, MD;  Location: [facility];  Service: Thoracic;  Laterality: Right;   VIDEO ASSISTED THORACOSCOPY (VATS)/EMPYEMA     06/23/2012   VIDEO BRONCHOSCOPY  06/23/2012   Procedure: VIDEO BRONCHOSCOPY;  Surgeon: Grace Isaac, MD;  Location: [facility];  Service: Thoracic;  Laterality: N/A;    Allergies  Allergen Reactions   Actonel [Risedronate Sodium] Other (See Comments)    Joint aches; rechallenged --caused joint aches   Ivp Dye [Iodinated Contrast Media] Nausea And  Vomiting    Outpatient Encounter Medications as of 09/26/2021  Medication Sig   acetaminophen (ACETAMINOPHEN EXTRA STRENGTH) 500 MG tablet Take 500 mg by mouth every 6 (six) hours as needed.   acetaminophen (TYLENOL) 325 MG tablet Take 650 mg by mouth 3 (three) times daily.   Amino Acids-Protein Hydrolys (FEEDING SUPPLEMENT, PRO-STAT SUGAR FREE 64,) LIQD Take 30 mLs by mouth 2 (two) times daily.   Calcium-Phosphorus-Vitamin D (CALCIUM/VITAMIN D3/ADULT GUMMY) 250-100-500 MG-MG-UNIT CHEW Chew 1,000 mg by mouth in the morning.   cholecalciferol (VITAMIN D3) 25 MCG (1000 UNIT) tablet Take 1,000 Units by mouth daily.   doxycycline (VIBRAMYCIN) 100 MG capsule Take 100 mg by mouth 2 (two) times daily.   Iron-Vitamin C 65-125 MG TABS Take 1 tablet by mouth in the morning.   Lidocaine 4 % PTCH Apply 1 patch topically daily. Apply to back daily. On for 12 hours. Off for 12 hours.   Multiple Vitamins-Minerals (OCUVITE EYE + MULTI PO) Take 1 tablet by mouth daily.    ondansetron (ZOFRAN-ODT) 4 MG disintegrating tablet Take 4 mg by mouth every 6 (six) hours. PRN nausea   oxycodone (OXY-IR) 5 MG capsule Take 5 mg by mouth daily.   oxycodone (OXY-IR) 5 MG capsule Take one tablet by mouth twice daily at 7am and 9pm; Take 1/2 tablet by mouth three times daily at 1pm,4pm and 3am.   pantoprazole (PROTONIX) 40 MG tablet Take 40 mg by mouth in the morning.   polyethylene glycol (MIRALAX / GLYCOLAX) 17 g packet Take 17 g by mouth daily.   potassium chloride (KLOR-CON M) 10 MEQ tablet Take 20 mEq by mouth 3 (three) times daily.   predniSONE (DELTASONE) 10 MG tablet Take 10 mg by mouth daily.   saccharomyces boulardii (FLORASTOR) 250 MG capsule Take 250 mg by mouth 2 (two) times daily.   senna (SENOKOT) 8.6 MG tablet Take 1 tablet by mouth daily.   torsemide (DEMADEX) 20 MG tablet Take 20 mg by mouth daily.   zinc oxide 20 % ointment Apply 1 application topically as needed for irritation. To buttocks after every  incontinent episode and as needed for redness. May keep at bedside.   [DISCONTINUED] acetaminophen (TYLENOL) 500 MG tablet Take 500 mg by mouth every 6 (six) hours as needed. (Patient not taking: Reported on 09-26-2021)   [DISCONTINUED] Calcium Carbonate 500 MG CHEW Chew 1,000 mg by mouth daily. (Patient not taking: Reported on 2021/09/26)   [DISCONTINUED] Collagen-Boron-Hyaluronic Acid ([employer] JOINT HEALTH TRIPLE ACTION PO) Take 1 tablet by mouth daily. (Patient not taking: Reported on 2021-09-26)   [DISCONTINUED] famotidine (PEPCID) 20 MG tablet Take 1 tablet (20 mg total) by mouth daily. (Patient not taking: Reported on 09-26-21)   [DISCONTINUED] Multiple Vitamins-Minerals (MULTIVITAMIN WITH MINERALS) tablet Take 1 tablet by mouth daily. (Patient not taking: Reported on 09-26-21)   [DISCONTINUED] NON FORMULARY Take 900 mg by mouth daily. Heal and Soothe capsule; 300 mg; amt: 900 mg;  oral Once A Day (Patient not taking: Reported on September 15, 2021)   No facility-administered encounter medications on file as of 09/15/2021.    Review of Systems  Constitutional:  Positive for fatigue. Negative for activity change, appetite change, chills and fever.  HENT:  Positive for hearing loss and trouble swallowing.   Eyes:  Negative for visual disturbance.  Respiratory:  Negative for cough, shortness of breath and wheezing.   Cardiovascular:  Negative for chest pain and leg swelling.  Gastrointestinal:  Positive for constipation. Negative for abdominal distention, abdominal pain, diarrhea and nausea.  Genitourinary:  Negative for dysuria and hematuria.  Musculoskeletal:  Positive for arthralgias, back pain and gait problem.  Skin:  Negative for wound.  Neurological:  Positive for weakness. Negative for dizziness and headaches.  Psychiatric/Behavioral:  Positive for dysphoric mood and hallucinations. The patient is not nervous/anxious.    Immunization History  Administered Date(s) Administered   Influenza Split  09/11/2012, 04/12/2013, 04/30/2015   Influenza, High Dose Seasonal PF 04/18/2017, 04/25/2019   Influenza,inj,Quad PF,6+ Mos 04/13/2018   Influenza-Unspecified 09/19/2012, 05/03/2014, 04/22/2016, 04/20/2017, 04/22/2020, 04/30/2021   Moderna SARS-COV2 Booster Vaccination 05/20/2020, 12/09/2020   Moderna Sars-Covid-2 Vaccination 07/16/2019, 08/13/2019   Pfizer Covid-19 Vaccine Bivalent Booster 42yrs & up 03/31/2021   Pneumococcal Conjugate-13 04/16/2015   Pneumococcal Polysaccharide-23 07/12/2004   Td 01/10/2012   Tdap 11/05/2015   Zoster Recombinat (Shingrix) 02/07/2016   Pertinent  Health Maintenance Due  Topic Date Due   INFLUENZA VACCINE  Completed   DEXA SCAN  Completed   Fall Risk 10/21/2019 10/22/2019 10/23/2019 06/11/2021 06/17/2021  Falls in the past year? - - 0 1 -  Number of falls in past year - - - - -  Was there an injury with Fall? - - - 0 -  Fall Risk Category Calculator - - - 2 -  Fall Risk Category - - - Moderate -  Patient Fall Risk Level High fall risk High fall risk - High fall risk High fall risk   Functional Status Survey:    Vitals:   09-15-21 1108  BP: 114/73  Pulse: 100  Resp: 20  Temp: 97.8 F (36.6 C)  SpO2: 91%  Weight: 133 lb 11.2 oz (60.6 kg)  Height: 5' (1.524 m)   Body mass index is 26.11 kg/m. Physical Exam Vitals reviewed.  Constitutional:      General: She is not in acute distress. HENT:     Head: Normocephalic.     Right Ear: There is impacted cerumen.     Left Ear: There is impacted cerumen.     Nose: Nose normal.     Mouth/Throat:     Mouth: Mucous membranes are moist.  Eyes:     General:        Right eye: No discharge.        Left eye: No discharge.  Neck:     Vascular: No carotid bruit.  Cardiovascular:     Rate and Rhythm: Normal rate and regular rhythm.     Pulses: Normal pulses.     Heart sounds: Normal heart sounds.  Pulmonary:     Effort: Pulmonary effort is normal. No respiratory distress.     Breath sounds:  Examination of the right-lower field reveals decreased breath sounds. Examination of the left-lower field reveals decreased breath sounds. Decreased breath sounds present. No wheezing.     Comments: Room air Abdominal:     General: There is no distension.     Palpations: Abdomen is soft.  Tenderness: There is no abdominal tenderness.     Comments: Hypoactive bowel sounds x 4   Musculoskeletal:     Cervical back: Neck supple.     Right lower leg: No edema.     Left lower leg: No edema.  Lymphadenopathy:     Cervical: No cervical adenopathy.  Skin:    General: Skin is warm and dry.     Capillary Refill: Capillary refill takes less than 2 seconds.  Neurological:     General: No focal deficit present.     Mental Status: She is alert. Mental status is at baseline.     Motor: Weakness present.     Gait: Gait abnormal.  Psychiatric:        Mood and Affect: Mood normal.        Behavior: Behavior normal.     Comments: Very pleasant, follows commands, alert to self and person    Labs reviewed: Recent Labs    10/30/20 1203 03/03/21 0000 05/11/21 1556 06/17/21 1144 08/12/21 0000  NA 142   < > 142 140 139  K 4.0   < > 4.0 3.3* 4.1  CL 106   < > 99 101 99  CO2 27   < > 29 32 29*  GLUCOSE 127*  --  125* 116*  --   BUN 18   < > 19 20 26*  CREATININE 0.74   < > 0.75 0.67 0.7  CALCIUM 8.8*   < > 9.9 9.5 9.6  MG  --   --   --   --  2.3   < > = values in this interval not displayed.   Recent Labs    03/03/21 0000 03/19/21 0000 08/12/21 0000  AST 13 12* 16  ALT 10 7 14   ALKPHOS 54 70 96  ALBUMIN 3.3* 3.2* 3.5   Recent Labs    10/30/20 1203 03/03/21 0000 03/19/21 0000 05/11/21 1556 06/17/21 1144 08/12/21 0000 09/02/21 0000  WBC 7.8   < > 4.8 7.0 7.8 6.9 13.5  NEUTROABS  --    < > 2,534.00  --   --  4,733.00 11,327.00  HGB 13.6   < > 11.6* 12.6 13.1 10.8* 9.9*  HCT 42.9   < > 35* 36.3 41.2 32* 30*  MCV 106.5*  --   --  96 105.6*  --   --   PLT 124*   < > 118* 180  145* 161 199   < > = values in this interval not displayed.   Lab Results  Component Value Date   TSH 0.69 08/12/2021   Lab Results  Component Value Date   HGBA1C 5.3 03/03/2021   Lab Results  Component Value Date   CHOL 146 03/03/2021   HDL 51 03/03/2021   LDLCALC 80 03/03/2021   TRIG 71 03/03/2021   CHOLHDL 3.3 02/26/2019    Significant Diagnostic Results in last 30 days:  No results found.  Assessment/Plan 1. Do not resuscitate  2. Pneumonia of both lower lobes due to infectious organism - 02/23- CXR bilateral pneumonia - cont doxycycline 100 mg po bid x 10 days - cont Florastor  - recommend mucinex 600 mg po bid x 10 days- will discuss with daughter - recommend incentive spirometer- 10x/ QID  3. Slow transit constipation - 02/23 abdominal xray revealed increased fecal matter in distal colon - abdomen non distended, hypoactive BS x 4 - cont senna and miralax - Discontinue oxycodone 5 mg at 2 am and 1pm -  Reduce oxycodone to 2.5 mg for 8am and 2 pm dose  4. Sleepiness - alert today - suspect due to pneumonia  - UA/culture pending  5. Bilateral impacted cerumen - cannot visualize TM due to wax - will start Debrox 5 gtts- apply to both ears bid x 5 days- start 09/11/2021 - flush ears with warm water when debrox complete   Family/ staff Communication: plan discussed with patient, daughter and nurse  Labs/tests ordered:  UA/culture

## 2021-09-09 NOTE — Progress Notes (Deleted)
Location:  [facility] Room Number: N36/A Place of Service:  SNF [phone]) Provider: Yvonna Alanis, NP  Patient Care Team: [name], MD as PCP - General (Internal Medicine) [name], MD as PCP - Electrophysiology (Cardiology) [place], [facility] [name], MD as Consulting Physician (Cardiology) Irine Seal, MD as Attending Physician (Urology) Clance, Armando Reichert, MD as Consulting Physician (Pulmonary Disease) Grace Isaac, MD (Inactive) as Consulting Physician (Cardiothoracic Surgery) Mauri Pole, MD as Consulting Physician (Gastroenterology) Tat, Eustace Quail, DO as Consulting Physician (Neurology)  Extended Emergency Contact Information Primary Emergency Contact: Sand,Dorothy Address: [address]          [place], [place] [address] Johnnette Litter of [place] Phone: [phone] Mobile Phone: [phone] Relation: Daughter Secondary Emergency Contact: [place], [place] [address] Johnnette Litter of [place] Phone: [phone] Mobile Phone: [phone] Relation: Daughter  Code Status:  DNR Goals of care: Advanced Directive information Advanced Directives 2021-10-03  Does Patient Have a Medical Advance Directive? Yes  Type of Paramedic of [place];Living will;Out of facility DNR (pink MOST or yellow form)  Does patient want to make changes to medical advance directive? No - Patient declined  Copy of [place] in Chart? Yes - validated most recent copy scanned in chart (See row information)  Would patient like information on creating a medical advance directive? -  Pre-existing out of facility DNR order (yellow form or pink MOST form) Pink MOST form placed in chart (order not valid for inpatient use);Yellow form placed in chart (order not valid for inpatient use)     Chief Complaint  Patient presents with   Medical Management of Chronic Issues    Routine visit. Discuss the  need for Shingrix vaccine and additional COVID booster, or post pone if patient refuses.    HPI:  Pt is a 88 y.o. [place] seen today for medical management of chronic diseases.     Past Medical History:  Diagnosis Date   Achalasia    Anemia    Arthritis    back   Chronic steroid use    Complete heart block ([place])    S/P PACEMAKER 2000 W/ GENERATOR CHANGE 2009   Empyema, right ([place]) PULMOLOGIST-- DR CLANCE   VATS Jill/13/2013 cultures negative to date CXR 07/19/12 persistent airfluid levels/  CXR 11-01-2012 IMPROVE RIGHT PLEURAL EFFUSION   GERD (gastroesophageal reflux disease)    History of aspiration pneumonitis    DEC 2013   History of hiatal hernia    Hypertension    Inguinal hernia    right   Intrinsic urethral sphincter deficiency    Megaesophagus    Mixed stress and urge urinary incontinence    Multinodular thyroid Jill/16/2013   Multi nodular goiter. Large nodules in both lobes of the gland.  These nodules fit national criteria for fine needle aspiration  biopsy if not previously assessed.     OSA on CPAP    cpap, doees not know settings   Polymyalgia rheumatica ([place])    on chronic Prednisone 5mg  daily   RBBB    S/P dilatation of esophageal stricture    Past Surgical History:  Procedure Laterality Date   APPENDECTOMY  1953   w/ removal benign kidney tumor    BALLOON DILATION  07/24/2012   Procedure: BALLOON DILATION;  Surgeon: Inda Castle, MD;  Location: [facility];  Service: Endoscopy;  Laterality: N/A;  BALLOON DILATION N/A 12/03/2015   Procedure: BALLOON DILATION;  Surgeon: Mauri Pole, MD;  Location: [facility];  Service: Endoscopy;  Laterality: N/A;  pnuematic balloon   BALLOON DILATION N/A 03/16/2017   Procedure: BALLOON DILATION;  Surgeon: Mauri Pole, MD;  Location: [facility];  Service: Endoscopy;  Laterality: N/A;  PNUEMATIC BALLOONS   BOTOX INJECTION  08/07/2012   Procedure: BOTOX INJECTION;  Surgeon: Inda Castle, MD;  Location: [facility];  Service: Endoscopy;  Laterality: N/A;   BOTOX INJECTION N/A 05/24/2013   Procedure: MACROPLASTIQUE IMPLANT;  Surgeon: Irine Seal, MD;  Location: [facility];  Service: Urology;  Laterality: N/A;   BOTOX INJECTION  02/25/2014   Procedure: BOTOX INJECTION;  Surgeon: Inda Castle, MD;  Location: [facility];  Service: Endoscopy;;   CARDIAC PACEMAKER PLACEMENT  Jill/2000  DR RUTH GREENFIELD AT [facility]  ( LAST PACER CHECK 05-09-2013) for CHB/   END-OF-LIFE GENERATOR CHANGE  2009   CATARACT EXTRACTION W/ INTRAOCULAR LENS  IMPLANT, BILATERAL  2005   DILATION AND CURETTAGE OF UTERUS     ESOPHAGOGASTRODUODENOSCOPY  07/24/2012   Procedure: ESOPHAGOGASTRODUODENOSCOPY (EGD);  Surgeon: Inda Castle, MD;  Location: [facility];  Service: Endoscopy;  Laterality: N/A;   ESOPHAGOGASTRODUODENOSCOPY  08/07/2012   Procedure: ESOPHAGOGASTRODUODENOSCOPY (EGD);  Surgeon: Inda Castle, MD;  Location: [facility];  Service: Endoscopy;  Laterality: N/A;   ESOPHAGOGASTRODUODENOSCOPY N/A 02/25/2014   Procedure: ESOPHAGOGASTRODUODENOSCOPY (EGD);  Surgeon: Inda Castle, MD;  Location: [facility];  Service: Endoscopy;  Laterality: N/A;   ESOPHAGOGASTRODUODENOSCOPY N/A 03/16/2017   Procedure: ESOPHAGOGASTRODUODENOSCOPY (EGD);  Surgeon: Mauri Pole, MD;  Location: [facility];  Service: Endoscopy;  Laterality: N/A;   ESOPHAGOGASTRODUODENOSCOPY N/A 08/02/2018   Procedure: ESOPHAGOGASTRODUODENOSCOPY (EGD);  Surgeon: Mauri Pole, MD;  Location: [facility];  Service: Endoscopy;  Laterality: N/A;   ESOPHAGOGASTRODUODENOSCOPY (EGD) WITH ESOPHAGEAL DILATION  Jill/17/2013   Procedure: ESOPHAGOGASTRODUODENOSCOPY (EGD) WITH ESOPHAGEAL DILATION;  Surgeon: Ladene Artist, MD,FACG;  Location: [facility];  Service: Endoscopy;  Laterality: N/A;   ESOPHAGOGASTRODUODENOSCOPY (EGD) WITH PROPOFOL N/A 10/09/2015   Procedure: ESOPHAGOGASTRODUODENOSCOPY (EGD) WITH PROPOFOL ( WITH  BOTOX);  Surgeon: Milus Banister, MD;  Location: [facility];  Service: Endoscopy;  Laterality: N/A;   ESOPHAGOGASTRODUODENOSCOPY (EGD) WITH PROPOFOL N/A 12/03/2015   Procedure: ESOPHAGOGASTRODUODENOSCOPY (EGD) WITH PROPOFOL;  Surgeon: Mauri Pole, MD;  Location: [facility];  Service: Endoscopy;  Laterality: N/A;   FOREIGN BODY REMOVAL  08/02/2018   Procedure: FOREIGN BODY REMOVAL;  Surgeon: Mauri Pole, MD;  Location: [facility];  Service: Endoscopy;;   PACEMAKER GENERATOR CHANGE  12/13/2007   at [place] N/A Jill/01/2021   Procedure: PPM GENERATOR CHANGEOUT;  Surgeon: [name], MD;  Location: [facility];  Service: Cardiovascular;  Laterality: N/A;   TOE SURGERY  2013   left 3rd toe HAMMERTOE REPAIR   TONSILLECTOMY  AS CHILD   TOTAL HIP ARTHROPLASTY Right 2001   VERICOSE VEIN LIGATION     VIDEO ASSISTED THORACOSCOPY (VATS)/DECORTICATION  Jill/13/2013   Procedure: VIDEO ASSISTED THORACOSCOPY (VATS)/DECORTICATION;  Surgeon: Grace Isaac, MD;  Location: [facility];  Service: Thoracic;  Laterality: Right;   VIDEO ASSISTED THORACOSCOPY (VATS)/EMPYEMA     Jill/13/2013   VIDEO BRONCHOSCOPY  Jill/13/2013   Procedure: VIDEO BRONCHOSCOPY;  Surgeon: Grace Isaac, MD;  Location: [facility];  Service: Thoracic;  Laterality: N/A;    Allergies  Allergen Reactions   Actonel [  Risedronate Sodium] Other (See Comments)    Joint aches; rechallenged --caused joint aches   Ivp Dye [Iodinated Contrast Media] Nausea And Vomiting    Outpatient Encounter Medications as of 2021-09-15  Medication Sig   acetaminophen (ACETAMINOPHEN EXTRA STRENGTH) 500 MG tablet Take 500 mg by mouth every 6 (six) hours as needed.   acetaminophen (TYLENOL) 325 MG tablet Take 650 mg by mouth 3 (three) times daily.   Amino Acids-Protein Hydrolys (FEEDING SUPPLEMENT, PRO-STAT SUGAR FREE 64,) LIQD Take 30 mLs by mouth 2 (two) times daily.   Calcium-Phosphorus-Vitamin D (CALCIUM/VITAMIN  D3/ADULT GUMMY) 250-100-500 MG-MG-UNIT CHEW Chew 1,000 mg by mouth in the morning.   cholecalciferol (VITAMIN D3) 25 MCG (1000 UNIT) tablet Take 1,000 Units by mouth daily.   doxycycline (VIBRAMYCIN) 100 MG capsule Take 100 mg by mouth 2 (two) times daily.   Iron-Vitamin C 65-125 MG TABS Take 1 tablet by mouth in the morning.   Lidocaine 4 % PTCH Apply 1 patch topically daily. Apply to back daily. On for Jill hours. Off for Jill hours.   Multiple Vitamins-Minerals (OCUVITE EYE + MULTI PO) Take 1 tablet by mouth daily.    ondansetron (ZOFRAN-ODT) 4 MG disintegrating tablet Take 4 mg by mouth every 6 (six) hours. PRN nausea   oxycodone (OXY-IR) 5 MG capsule Take 5 mg by mouth daily.   oxycodone (OXY-IR) 5 MG capsule Take one tablet by mouth twice daily at 7am and 9pm; Take 1/2 tablet by mouth three times daily at 1pm,4pm and 3am.   pantoprazole (PROTONIX) 40 MG tablet Take 40 mg by mouth in the morning.   polyethylene glycol (MIRALAX / GLYCOLAX) 17 g packet Take 17 g by mouth daily.   potassium chloride (KLOR-CON M) 10 MEQ tablet Take 20 mEq by mouth 3 (three) times daily.   predniSONE (DELTASONE) 10 MG tablet Take 10 mg by mouth daily.   saccharomyces boulardii (FLORASTOR) 250 MG capsule Take 250 mg by mouth 2 (two) times daily.   senna (SENOKOT) 8.6 MG tablet Take 1 tablet by mouth daily.   torsemide (DEMADEX) 20 MG tablet Take 20 mg by mouth daily.   zinc oxide 20 % ointment Apply 1 application topically as needed for irritation. To buttocks after every incontinent episode and as needed for redness. May keep at bedside.   [DISCONTINUED] acetaminophen (TYLENOL) 500 MG tablet Take 500 mg by mouth every 6 (six) hours as needed. (Patient not taking: Reported on 09-15-21)   [DISCONTINUED] Calcium Carbonate 500 MG CHEW Chew 1,000 mg by mouth daily. (Patient not taking: Reported on Sep 15, 2021)   [DISCONTINUED] Collagen-Boron-Hyaluronic Acid ([employer] JOINT HEALTH TRIPLE ACTION PO) Take 1 tablet by mouth daily.  (Patient not taking: Reported on 09-15-21)   [DISCONTINUED] famotidine (PEPCID) 20 MG tablet Take 1 tablet (20 mg total) by mouth daily. (Patient not taking: Reported on 09/15/2021)   [DISCONTINUED] Multiple Vitamins-Minerals (MULTIVITAMIN WITH MINERALS) tablet Take 1 tablet by mouth daily. (Patient not taking: Reported on 09/15/21)   [DISCONTINUED] NON FORMULARY Take 900 mg by mouth daily. Heal and Soothe capsule; 300 mg; amt: 900 mg; oral Once A Day (Patient not taking: Reported on September 15, 2021)   No facility-administered encounter medications on file as of 15-Sep-2021.    Review of Systems  Immunization History  Administered Date(s) Administered   Influenza Split 09/11/2012, 04/12/2013, 04/30/2015   Influenza, High Dose Seasonal PF 04/18/2017, 04/25/2019   Influenza,inj,Quad PF,6+ Mos 04/13/2018   Influenza-Unspecified 09/19/2012, 05/03/2014, 10/Jill/2017, 04/20/2017, 10/Jill/2021, 04/30/2021   Moderna SARS-COV2 Booster Vaccination 05/20/2020,  12/09/2020   Moderna Sars-Covid-2 Vaccination 07/16/2019, 08/13/2019   Pfizer Covid-19 Vaccine Bivalent Booster 37yrs & up 03/31/2021   Pneumococcal Conjugate-13 04/16/2015   Pneumococcal Polysaccharide-23 07/12/2004   Td 01/10/2012   Tdap 11/05/2015   Zoster Recombinat (Shingrix) 02/07/2016   Pertinent  Health Maintenance Due  Topic Date Due   INFLUENZA VACCINE  Completed   DEXA SCAN  Completed   Fall Risk 10/21/2019 4/Jill/2021 10/23/2019 Jill/07/2020 Jill/01/2021  Falls in the past year? - - 0 1 -  Number of falls in past year - - - - -  Was there an injury with Fall? - - - 0 -  Fall Risk Category Calculator - - - 2 -  Fall Risk Category - - - Moderate -  Patient Fall Risk Level High fall risk High fall risk - High fall risk High fall risk   Functional Status Survey:    Vitals:   09/10/2021 1108  BP: 114/73  Pulse: 100  Resp: 20  Temp: 97.8 F (36.6 C)  SpO2: 91%  Weight: 133 lb 11.2 oz (60.6 kg)  Height: 5' (1.524 m)   Body mass index is  26.11 kg/m. Physical Exam  Labs reviewed: Recent Labs    10/30/20 1203 03/03/21 0000 05/11/21 1556 Jill/07/22 1144 08/12/21 0000  NA 142   < > 142 140 139  K 4.0   < > 4.0 3.3* 4.1  CL 106   < > 99 101 99  CO2 27   < > 29 32 29*  GLUCOSE 127*  --  125* 116*  --   BUN 18   < > 19 20 26*  CREATININE 0.74   < > 0.75 0.67 0.7  CALCIUM 8.8*   < > 9.9 9.5 9.6  MG  --   --   --   --  2.3   < > = values in this interval not displayed.   Recent Labs    03/03/21 0000 03/19/21 0000 08/12/21 0000  AST 13 Jill* 16  ALT 10 7 14   ALKPHOS 54 70 96  ALBUMIN 3.3* 3.2* 3.5   Recent Labs    10/30/20 1203 03/03/21 0000 03/19/21 0000 05/11/21 1556 Jill/07/22 1144 08/12/21 0000 09/02/21 0000  WBC 7.8   < > 4.8 7.0 7.8 6.9 13.5  NEUTROABS  --    < > 2,534.00  --   --  4,733.00 11,327.00  HGB 13.6   < > 11.6* Jill.6 13.1 10.8* 9.9*  HCT 42.9   < > 35* 36.3 41.2 32* 30*  MCV 106.5*  --   --  96 105.6*  --   --   PLT 124*   < > 118* 180 145* 161 199   < > = values in this interval not displayed.   Lab Results  Component Value Date   TSH 0.69 08/12/2021   Lab Results  Component Value Date   HGBA1C 5.3 03/03/2021   Lab Results  Component Value Date   CHOL 146 03/03/2021   HDL 51 03/03/2021   LDLCALC 80 03/03/2021   TRIG 71 03/03/2021   CHOLHDL 3.3 02/26/2019    Significant Diagnostic Results in last 30 days:  No results found.  Assessment/Plan There are no diagnoses linked to this encounter.   Family/ staff Communication: ***  Labs/tests ordered:  ***

## 2021-09-09 DEATH — deceased

## 2021-09-15 ENCOUNTER — Encounter: Payer: Medicare Other | Admitting: Internal Medicine

## 2021-12-22 ENCOUNTER — Encounter: Payer: Medicare Other | Admitting: Psychology

## 2022-01-05 ENCOUNTER — Encounter: Payer: Medicare Other | Admitting: Psychology
